# Patient Record
Sex: Male | Born: 1960
Health system: Southern US, Community
[De-identification: ages and names within clinical notes are randomized; demographics above are authoritative.]

## PROBLEM LIST (undated history)

## (undated) DIAGNOSIS — Z720 Tobacco use: Secondary | ICD-10-CM

## (undated) DIAGNOSIS — I5022 Chronic systolic (congestive) heart failure: Secondary | ICD-10-CM

## (undated) DIAGNOSIS — K219 Gastro-esophageal reflux disease without esophagitis: Secondary | ICD-10-CM

## (undated) DIAGNOSIS — J189 Pneumonia, unspecified organism: Secondary | ICD-10-CM

## (undated) DIAGNOSIS — Z9289 Personal history of other medical treatment: Secondary | ICD-10-CM

## (undated) DIAGNOSIS — T7840XA Allergy, unspecified, initial encounter: Secondary | ICD-10-CM

## (undated) DIAGNOSIS — Z8739 Personal history of other diseases of the musculoskeletal system and connective tissue: Secondary | ICD-10-CM

## (undated) DIAGNOSIS — F32A Depression, unspecified: Secondary | ICD-10-CM

## (undated) DIAGNOSIS — M1990 Unspecified osteoarthritis, unspecified site: Secondary | ICD-10-CM

## (undated) DIAGNOSIS — M869 Osteomyelitis, unspecified: Secondary | ICD-10-CM

## (undated) DIAGNOSIS — D649 Anemia, unspecified: Secondary | ICD-10-CM

## (undated) DIAGNOSIS — C689 Malignant neoplasm of urinary organ, unspecified: Secondary | ICD-10-CM

## (undated) DIAGNOSIS — I10 Essential (primary) hypertension: Secondary | ICD-10-CM

## (undated) DIAGNOSIS — R7881 Bacteremia: Secondary | ICD-10-CM

## (undated) DIAGNOSIS — E785 Hyperlipidemia, unspecified: Secondary | ICD-10-CM

## (undated) DIAGNOSIS — I2510 Atherosclerotic heart disease of native coronary artery without angina pectoris: Secondary | ICD-10-CM

## (undated) DIAGNOSIS — J45909 Unspecified asthma, uncomplicated: Secondary | ICD-10-CM

## (undated) HISTORY — PX: WISDOM TOOTH EXTRACTION: SHX21

## (undated) HISTORY — PX: OTHER SURGICAL HISTORY: SHX169

## (undated) HISTORY — DX: Chronic systolic (congestive) heart failure: I50.22

## (undated) HISTORY — PX: KNEE SURGERY: SHX244

## (undated) HISTORY — DX: Unspecified osteoarthritis, unspecified site: M19.90

## (undated) HISTORY — PX: MULTIPLE TOOTH EXTRACTIONS: SHX2053

## (undated) HISTORY — DX: Atherosclerotic heart disease of native coronary artery without angina pectoris: I25.10

## (undated) HISTORY — DX: Essential (primary) hypertension: I10

## (undated) HISTORY — DX: Hyperlipidemia, unspecified: E78.5

## (undated) HISTORY — DX: Allergy, unspecified, initial encounter: T78.40XA

---

## 1999-12-03 ENCOUNTER — Ambulatory Visit (HOSPITAL_COMMUNITY): Admission: RE | Admit: 1999-12-03 | Discharge: 1999-12-03 | Payer: Self-pay | Admitting: Orthopedic Surgery

## 1999-12-03 ENCOUNTER — Encounter: Payer: Self-pay | Admitting: Orthopedic Surgery

## 2000-01-01 ENCOUNTER — Encounter: Payer: Self-pay | Admitting: Orthopedic Surgery

## 2000-01-01 ENCOUNTER — Ambulatory Visit (HOSPITAL_COMMUNITY): Admission: RE | Admit: 2000-01-01 | Discharge: 2000-01-01 | Payer: Self-pay | Admitting: Orthopedic Surgery

## 2000-01-15 ENCOUNTER — Ambulatory Visit (HOSPITAL_COMMUNITY): Admission: RE | Admit: 2000-01-15 | Discharge: 2000-01-15 | Payer: Self-pay | Admitting: Orthopedic Surgery

## 2000-01-15 ENCOUNTER — Encounter: Payer: Self-pay | Admitting: Orthopedic Surgery

## 2000-01-29 ENCOUNTER — Ambulatory Visit (HOSPITAL_COMMUNITY): Admission: RE | Admit: 2000-01-29 | Discharge: 2000-01-29 | Payer: Self-pay | Admitting: Orthopedic Surgery

## 2000-01-29 ENCOUNTER — Encounter: Payer: Self-pay | Admitting: Orthopedic Surgery

## 2000-10-21 ENCOUNTER — Encounter: Admission: RE | Admit: 2000-10-21 | Discharge: 2000-12-22 | Payer: Self-pay | Admitting: Neurological Surgery

## 2001-02-09 ENCOUNTER — Ambulatory Visit (HOSPITAL_COMMUNITY): Admission: RE | Admit: 2001-02-09 | Discharge: 2001-02-09 | Payer: Self-pay | Admitting: Neurological Surgery

## 2001-02-09 ENCOUNTER — Encounter: Payer: Self-pay | Admitting: Neurological Surgery

## 2002-03-22 ENCOUNTER — Emergency Department (HOSPITAL_COMMUNITY): Admission: EM | Admit: 2002-03-22 | Discharge: 2002-03-23 | Payer: Self-pay | Admitting: Emergency Medicine

## 2003-09-27 ENCOUNTER — Encounter: Admission: RE | Admit: 2003-09-27 | Discharge: 2003-09-27 | Payer: Self-pay | Admitting: Internal Medicine

## 2003-09-27 ENCOUNTER — Ambulatory Visit (HOSPITAL_COMMUNITY): Admission: RE | Admit: 2003-09-27 | Discharge: 2003-09-27 | Payer: Self-pay | Admitting: Internal Medicine

## 2003-10-25 ENCOUNTER — Encounter: Admission: RE | Admit: 2003-10-25 | Discharge: 2003-10-25 | Payer: Self-pay | Admitting: Internal Medicine

## 2003-11-06 ENCOUNTER — Ambulatory Visit (HOSPITAL_COMMUNITY): Admission: RE | Admit: 2003-11-06 | Discharge: 2003-11-06 | Payer: Self-pay | Admitting: Internal Medicine

## 2004-07-03 ENCOUNTER — Ambulatory Visit: Payer: Self-pay | Admitting: Internal Medicine

## 2004-07-10 ENCOUNTER — Encounter: Admission: RE | Admit: 2004-07-10 | Discharge: 2004-08-23 | Payer: Self-pay | Admitting: *Deleted

## 2005-07-14 ENCOUNTER — Ambulatory Visit: Payer: Self-pay | Admitting: Family Medicine

## 2005-07-17 ENCOUNTER — Ambulatory Visit: Payer: Self-pay | Admitting: Family Medicine

## 2005-07-24 ENCOUNTER — Ambulatory Visit: Payer: Self-pay | Admitting: *Deleted

## 2005-08-13 ENCOUNTER — Ambulatory Visit: Payer: Self-pay | Admitting: Family Medicine

## 2005-09-22 ENCOUNTER — Ambulatory Visit: Payer: Self-pay | Admitting: Family Medicine

## 2005-09-23 ENCOUNTER — Ambulatory Visit: Payer: Self-pay | Admitting: Family Medicine

## 2005-11-18 ENCOUNTER — Ambulatory Visit: Payer: Self-pay | Admitting: Family Medicine

## 2006-01-05 ENCOUNTER — Ambulatory Visit: Payer: Self-pay | Admitting: Family Medicine

## 2006-03-12 ENCOUNTER — Ambulatory Visit: Payer: Self-pay | Admitting: Internal Medicine

## 2006-04-08 ENCOUNTER — Ambulatory Visit (HOSPITAL_COMMUNITY): Admission: RE | Admit: 2006-04-08 | Discharge: 2006-04-08 | Payer: Self-pay | Admitting: Family Medicine

## 2006-06-18 ENCOUNTER — Ambulatory Visit: Payer: Self-pay | Admitting: Family Medicine

## 2006-07-14 ENCOUNTER — Ambulatory Visit: Payer: Self-pay | Admitting: Family Medicine

## 2006-09-25 ENCOUNTER — Ambulatory Visit: Payer: Self-pay | Admitting: Family Medicine

## 2006-09-29 ENCOUNTER — Ambulatory Visit: Payer: Self-pay | Admitting: Internal Medicine

## 2007-01-13 ENCOUNTER — Ambulatory Visit: Payer: Self-pay | Admitting: Family Medicine

## 2007-04-15 ENCOUNTER — Ambulatory Visit: Payer: Self-pay | Admitting: Family Medicine

## 2007-07-07 ENCOUNTER — Encounter (INDEPENDENT_AMBULATORY_CARE_PROVIDER_SITE_OTHER): Payer: Self-pay | Admitting: *Deleted

## 2007-08-31 ENCOUNTER — Ambulatory Visit: Payer: Self-pay | Admitting: Internal Medicine

## 2007-08-31 ENCOUNTER — Encounter (INDEPENDENT_AMBULATORY_CARE_PROVIDER_SITE_OTHER): Payer: Self-pay | Admitting: Family Medicine

## 2007-08-31 LAB — CONVERTED CEMR LAB
AST: 24 units/L (ref 0–37)
Alkaline Phosphatase: 58 units/L (ref 39–117)
BUN: 17 mg/dL (ref 6–23)
Glucose, Bld: 105 mg/dL — ABNORMAL HIGH (ref 70–99)
HDL: 36 mg/dL — ABNORMAL LOW (ref >39)
LDL Cholesterol: 68 mg/dL (ref 0–99)
Potassium: 4.3 meq/L (ref 3.5–5.3)
Total Bilirubin: 0.6 mg/dL (ref 0.3–1.2)
Total CHOL/HDL Ratio: 3.6
Triglycerides: 132 mg/dL (ref <150)
VLDL: 26 mg/dL (ref 0–40)

## 2007-09-07 ENCOUNTER — Ambulatory Visit: Payer: Self-pay | Admitting: Internal Medicine

## 2007-10-05 ENCOUNTER — Ambulatory Visit: Payer: Self-pay | Admitting: Internal Medicine

## 2007-10-05 ENCOUNTER — Encounter (INDEPENDENT_AMBULATORY_CARE_PROVIDER_SITE_OTHER): Payer: Self-pay | Admitting: Family Medicine

## 2007-10-05 LAB — CONVERTED CEMR LAB: PSA: 0.77 ng/mL (ref 0.10–4.00)

## 2008-06-21 ENCOUNTER — Ambulatory Visit: Payer: Self-pay | Admitting: Internal Medicine

## 2008-06-21 ENCOUNTER — Encounter (INDEPENDENT_AMBULATORY_CARE_PROVIDER_SITE_OTHER): Payer: Self-pay | Admitting: Family Medicine

## 2008-06-21 LAB — CONVERTED CEMR LAB
ALT: 36 units/L (ref 0–53)
AST: 26 units/L (ref 0–37)
Alkaline Phosphatase: 75 units/L (ref 39–117)
BUN: 24 mg/dL — ABNORMAL HIGH (ref 6–23)
Calcium: 9.6 mg/dL (ref 8.4–10.5)
Creatinine, Ser: 0.94 mg/dL (ref 0.40–1.50)
HDL: 58 mg/dL (ref >39)
LDL Cholesterol: 43 mg/dL (ref 0–99)
Microalb, Ur: 3.24 mg/dL — ABNORMAL HIGH (ref 0.00–1.89)
Total Bilirubin: 0.7 mg/dL (ref 0.3–1.2)
Total CHOL/HDL Ratio: 2.4
VLDL: 41 mg/dL — ABNORMAL HIGH (ref 0–40)

## 2008-07-11 ENCOUNTER — Ambulatory Visit: Payer: Self-pay | Admitting: Internal Medicine

## 2008-11-28 ENCOUNTER — Encounter (INDEPENDENT_AMBULATORY_CARE_PROVIDER_SITE_OTHER): Payer: Self-pay | Admitting: Internal Medicine

## 2008-11-28 ENCOUNTER — Ambulatory Visit: Payer: Self-pay | Admitting: Internal Medicine

## 2008-11-28 LAB — CONVERTED CEMR LAB
ALT: 54 units/L — ABNORMAL HIGH (ref 0–53)
Alkaline Phosphatase: 63 units/L (ref 39–117)
CO2: 23 meq/L (ref 19–32)
Cholesterol: 110 mg/dL (ref 0–200)
Creatinine, Ser: 0.8 mg/dL (ref 0.40–1.50)
LDL Cholesterol: 27 mg/dL (ref 0–99)
Total Bilirubin: 0.3 mg/dL (ref 0.3–1.2)
Total CHOL/HDL Ratio: 3
VLDL: 46 mg/dL — ABNORMAL HIGH (ref 0–40)

## 2008-12-01 ENCOUNTER — Encounter (INDEPENDENT_AMBULATORY_CARE_PROVIDER_SITE_OTHER): Payer: Self-pay | Admitting: Internal Medicine

## 2008-12-01 LAB — CONVERTED CEMR LAB: Hep B S Ab: NEGATIVE

## 2008-12-04 ENCOUNTER — Ambulatory Visit: Payer: Self-pay | Admitting: Internal Medicine

## 2009-01-11 ENCOUNTER — Ambulatory Visit: Payer: Self-pay | Admitting: Internal Medicine

## 2009-01-17 ENCOUNTER — Encounter (INDEPENDENT_AMBULATORY_CARE_PROVIDER_SITE_OTHER): Payer: Self-pay | Admitting: *Deleted

## 2009-06-19 ENCOUNTER — Ambulatory Visit: Payer: Self-pay | Admitting: Family Medicine

## 2009-06-19 ENCOUNTER — Encounter (INDEPENDENT_AMBULATORY_CARE_PROVIDER_SITE_OTHER): Payer: Self-pay | Admitting: Adult Health

## 2009-06-19 LAB — CONVERTED CEMR LAB
BUN: 22 mg/dL (ref 6–23)
CO2: 22 meq/L (ref 19–32)
Calcium: 9.9 mg/dL (ref 8.4–10.5)
Chloride: 95 meq/L — ABNORMAL LOW (ref 96–112)
Cholesterol: 150 mg/dL (ref 0–200)
Creatinine, Ser: 1.19 mg/dL (ref 0.40–1.50)
HDL: 30 mg/dL — ABNORMAL LOW (ref >39)
Total CHOL/HDL Ratio: 5

## 2009-06-20 ENCOUNTER — Encounter (INDEPENDENT_AMBULATORY_CARE_PROVIDER_SITE_OTHER): Payer: Self-pay | Admitting: Adult Health

## 2009-06-20 LAB — CONVERTED CEMR LAB
HCV Ab: NEGATIVE
Hep A IgM: NEGATIVE

## 2009-08-09 ENCOUNTER — Ambulatory Visit: Payer: Self-pay | Admitting: Internal Medicine

## 2009-08-09 ENCOUNTER — Encounter (INDEPENDENT_AMBULATORY_CARE_PROVIDER_SITE_OTHER): Payer: Self-pay | Admitting: Adult Health

## 2009-08-09 LAB — CONVERTED CEMR LAB: Microalb, Ur: 6.89 mg/dL — ABNORMAL HIGH (ref 0.00–1.89)

## 2009-08-28 ENCOUNTER — Ambulatory Visit: Payer: Self-pay | Admitting: Internal Medicine

## 2009-08-28 ENCOUNTER — Encounter (INDEPENDENT_AMBULATORY_CARE_PROVIDER_SITE_OTHER): Payer: Self-pay | Admitting: Adult Health

## 2009-08-28 LAB — CONVERTED CEMR LAB
AST: 79 units/L — ABNORMAL HIGH (ref 0–37)
Albumin: 5 g/dL (ref 3.5–5.2)
Alkaline Phosphatase: 65 units/L (ref 39–117)
HDL: 40 mg/dL (ref >39)
LDL Cholesterol: 66 mg/dL (ref 0–99)
Potassium: 5 meq/L (ref 3.5–5.3)
Sodium: 139 meq/L (ref 135–145)
Total Protein: 7.7 g/dL (ref 6.0–8.3)

## 2009-12-26 ENCOUNTER — Encounter (INDEPENDENT_AMBULATORY_CARE_PROVIDER_SITE_OTHER): Payer: Self-pay | Admitting: Adult Health

## 2009-12-26 ENCOUNTER — Ambulatory Visit: Payer: Self-pay | Admitting: Internal Medicine

## 2009-12-26 LAB — CONVERTED CEMR LAB
ALT: 61 units/L — ABNORMAL HIGH (ref 0–53)
CO2: 25 meq/L (ref 19–32)
Chloride: 102 meq/L (ref 96–112)
Sodium: 140 meq/L (ref 135–145)
Total Bilirubin: 0.6 mg/dL (ref 0.3–1.2)
Total Protein: 7.6 g/dL (ref 6.0–8.3)

## 2010-03-06 ENCOUNTER — Ambulatory Visit: Payer: Self-pay | Admitting: Internal Medicine

## 2010-09-09 ENCOUNTER — Encounter (INDEPENDENT_AMBULATORY_CARE_PROVIDER_SITE_OTHER): Payer: Self-pay | Admitting: *Deleted

## 2010-09-09 LAB — CONVERTED CEMR LAB
Alkaline Phosphatase: 84 units/L (ref 39–117)
Barbiturate Quant, Ur: NEGATIVE
Creatinine, Ser: 1.11 mg/dL (ref 0.40–1.50)
Creatinine,U: 324.7 mg/dL
Glucose, Bld: 142 mg/dL — ABNORMAL HIGH (ref 70–99)
HDL: 36 mg/dL — ABNORMAL LOW (ref >39)
LDL Cholesterol: 79 mg/dL (ref 0–99)
Methadone: NEGATIVE
Opiate Screen, Urine: NEGATIVE
Phencyclidine (PCP): NEGATIVE
Propoxyphene: NEGATIVE
Sodium: 140 meq/L (ref 135–145)
Total Bilirubin: 0.6 mg/dL (ref 0.3–1.2)
Total CHOL/HDL Ratio: 4.4
Total Protein: 8 g/dL (ref 6.0–8.3)
Triglycerides: 221 mg/dL — ABNORMAL HIGH (ref <150)
VLDL: 44 mg/dL — ABNORMAL HIGH (ref 0–40)

## 2010-09-17 ENCOUNTER — Ambulatory Visit (HOSPITAL_COMMUNITY): Admission: RE | Admit: 2010-09-17 | Discharge: 2010-09-17 | Payer: Self-pay | Admitting: Family Medicine

## 2010-11-08 ENCOUNTER — Encounter (INDEPENDENT_AMBULATORY_CARE_PROVIDER_SITE_OTHER): Payer: Self-pay | Admitting: *Deleted

## 2010-11-08 LAB — CONVERTED CEMR LAB
ALT: 111 units/L — ABNORMAL HIGH (ref 0–53)
Albumin: 5 g/dL (ref 3.5–5.2)
Total Protein: 7.9 g/dL (ref 6.0–8.3)

## 2011-01-29 ENCOUNTER — Inpatient Hospital Stay (HOSPITAL_COMMUNITY)
Admission: EM | Admit: 2011-01-29 | Discharge: 2011-02-03 | DRG: 617 | Disposition: A | Discharge status: Home Health | Payer: Self-pay | Attending: Internal Medicine | Admitting: Internal Medicine

## 2011-01-29 ENCOUNTER — Ambulatory Visit (INDEPENDENT_AMBULATORY_CARE_PROVIDER_SITE_OTHER): Payer: Self-pay

## 2011-01-29 ENCOUNTER — Inpatient Hospital Stay (INDEPENDENT_AMBULATORY_CARE_PROVIDER_SITE_OTHER)
Admission: RE | Admit: 2011-01-29 | Discharge: 2011-01-29 | Disposition: A | Discharge status: Home / Self Care | Payer: Self-pay | Source: Ambulatory Visit | Attending: Emergency Medicine | Admitting: Emergency Medicine

## 2011-01-29 ENCOUNTER — Emergency Department (HOSPITAL_COMMUNITY): Payer: Self-pay

## 2011-01-29 DIAGNOSIS — E1169 Type 2 diabetes mellitus with other specified complication: Principal | ICD-10-CM | POA: Diagnosis present

## 2011-01-29 DIAGNOSIS — M9080 Osteopathy in diseases classified elsewhere, unspecified site: Secondary | ICD-10-CM | POA: Diagnosis present

## 2011-01-29 DIAGNOSIS — L97509 Non-pressure chronic ulcer of other part of unspecified foot with unspecified severity: Secondary | ICD-10-CM

## 2011-01-29 DIAGNOSIS — Z7982 Long term (current) use of aspirin: Secondary | ICD-10-CM

## 2011-01-29 DIAGNOSIS — I428 Other cardiomyopathies: Secondary | ICD-10-CM | POA: Diagnosis present

## 2011-01-29 DIAGNOSIS — J301 Allergic rhinitis due to pollen: Secondary | ICD-10-CM | POA: Diagnosis present

## 2011-01-29 DIAGNOSIS — G894 Chronic pain syndrome: Secondary | ICD-10-CM | POA: Diagnosis present

## 2011-01-29 DIAGNOSIS — I10 Essential (primary) hypertension: Secondary | ICD-10-CM | POA: Diagnosis present

## 2011-01-29 DIAGNOSIS — E669 Obesity, unspecified: Secondary | ICD-10-CM | POA: Diagnosis present

## 2011-01-29 DIAGNOSIS — M869 Osteomyelitis, unspecified: Secondary | ICD-10-CM

## 2011-01-29 DIAGNOSIS — L02619 Cutaneous abscess of unspecified foot: Secondary | ICD-10-CM | POA: Diagnosis present

## 2011-01-29 DIAGNOSIS — Z79899 Other long term (current) drug therapy: Secondary | ICD-10-CM

## 2011-01-29 DIAGNOSIS — K219 Gastro-esophageal reflux disease without esophagitis: Secondary | ICD-10-CM | POA: Diagnosis present

## 2011-01-29 DIAGNOSIS — M86179 Other acute osteomyelitis, unspecified ankle and foot: Secondary | ICD-10-CM | POA: Diagnosis present

## 2011-01-29 LAB — BASIC METABOLIC PANEL
BUN: 20 mg/dL (ref 6–23)
BUN: 17 mg/dL (ref 6–23)
Calcium: 8.7 mg/dL (ref 8.4–10.5)
Calcium: 8.5 mg/dL (ref 8.4–10.5)
Chloride: 102 mEq/L (ref 96–112)
Creatinine, Ser: 1.13 mg/dL (ref 0.4–1.5)
GFR calc Af Amer: >60 mL/min (ref >60)
GFR calc non Af Amer: >60 mL/min (ref >60)
GFR calc non Af Amer: >60 mL/min (ref >60)
Potassium: 4.2 mEq/L (ref 3.5–5.1)
Sodium: 136 mEq/L (ref 135–145)

## 2011-01-29 LAB — CBC
HCT: 35.6 % — ABNORMAL LOW (ref 39.0–52.0)
Hemoglobin: 12.3 g/dL — ABNORMAL LOW (ref 13.0–17.0)
MCH: 31.1 pg (ref 26.0–34.0)
MCH: 30.8 pg (ref 26.0–34.0)
MCHC: 34.6 g/dL (ref 30.0–36.0)
MCV: 89.9 fL (ref 78.0–100.0)
MCV: 89.6 fL (ref 78.0–100.0)
Platelets: 201 10*3/uL (ref 150–400)
Platelets: 198 10*3/uL (ref 150–400)
RBC: 3.96 MIL/uL — ABNORMAL LOW (ref 4.22–5.81)
RBC: 3.67 MIL/uL — ABNORMAL LOW (ref 4.22–5.81)
RDW: 13.1 % (ref 11.5–15.5)
RDW: 13.3 % (ref 11.5–15.5)
WBC: 7.8 10*3/uL (ref 4.0–10.5)

## 2011-01-29 LAB — BASIC METABOLIC PANEL WITH GFR
CO2: 23 meq/L (ref 19–32)
Chloride: 105 meq/L (ref 96–112)
Creatinine, Ser: 1.07 mg/dL (ref 0.4–1.5)
GFR calc Af Amer: >60 mL/min (ref >60)
Glucose, Bld: 102 mg/dL — ABNORMAL HIGH (ref 70–99)

## 2011-01-29 LAB — DIFFERENTIAL
Basophils Absolute: 0 10*3/uL (ref 0.0–0.1)
Basophils Relative: 0 % (ref 0–1)
Eosinophils Absolute: 0.1 10*3/uL (ref 0.0–0.7)
Eosinophils Relative: 2 % (ref 0–5)
Lymphocytes Relative: 19 % (ref 12–46)
Lymphs Abs: 1.4 K/uL (ref 0.7–4.0)
Monocytes Absolute: 0.6 10*3/uL (ref 0.1–1.0)
Monocytes Relative: 8 % (ref 3–12)
Neutro Abs: 5.6 K/uL (ref 1.7–7.7)
Neutrophils Relative %: 72 % (ref 43–77)

## 2011-01-29 LAB — HEMOGLOBIN A1C: Mean Plasma Glucose: 114 mg/dL (ref <117)

## 2011-01-29 LAB — GLUCOSE, CAPILLARY

## 2011-01-29 LAB — PHOSPHORUS: Phosphorus: 3.2 mg/dL (ref 2.3–4.6)

## 2011-01-29 LAB — MAGNESIUM
Magnesium: 1.8 mg/dL (ref 1.5–2.5)
Magnesium: 1.7 mg/dL (ref 1.5–2.5)

## 2011-01-29 MED ORDER — GADOBENATE DIMEGLUMINE 529 MG/ML IV SOLN
20.0000 mL | Freq: Once | INTRAVENOUS | Status: AC
  Administered 2011-01-29: 20 mL via INTRAVENOUS

## 2011-01-30 LAB — GLUCOSE, CAPILLARY: Glucose-Capillary: 114 mg/dL — ABNORMAL HIGH (ref 70–99)

## 2011-01-31 ENCOUNTER — Inpatient Hospital Stay (HOSPITAL_COMMUNITY): Payer: Self-pay

## 2011-01-31 LAB — GLUCOSE, CAPILLARY
Glucose-Capillary: 118 mg/dL — ABNORMAL HIGH (ref 70–99)
Glucose-Capillary: 143 mg/dL — ABNORMAL HIGH (ref 70–99)

## 2011-01-31 LAB — SURGICAL PCR SCREEN
MRSA, PCR: NEGATIVE
Staphylococcus aureus: POSITIVE — AB

## 2011-01-31 LAB — APTT: aPTT: 42 seconds — ABNORMAL HIGH (ref 24–37)

## 2011-01-31 LAB — PROTIME-INR: Prothrombin Time: 13.5 seconds (ref 11.6–15.2)

## 2011-02-01 LAB — BASIC METABOLIC PANEL
CO2: 27 mEq/L (ref 19–32)
Chloride: 101 mEq/L (ref 96–112)
GFR calc Af Amer: >60 mL/min (ref >60)
Sodium: 137 mEq/L (ref 135–145)

## 2011-02-01 LAB — BASIC METABOLIC PANEL WITH GFR
BUN: 12 mg/dL (ref 6–23)
Calcium: 9 mg/dL (ref 8.4–10.5)
Creatinine, Ser: 1.09 mg/dL (ref 0.4–1.5)
GFR calc non Af Amer: >60 mL/min (ref >60)
Glucose, Bld: 108 mg/dL — ABNORMAL HIGH (ref 70–99)
Potassium: 3.9 meq/L (ref 3.5–5.1)

## 2011-02-01 LAB — CBC
HCT: 30.9 % — ABNORMAL LOW (ref 39.0–52.0)
Hemoglobin: 10.5 g/dL — ABNORMAL LOW (ref 13.0–17.0)
MCH: 30.4 pg (ref 26.0–34.0)
MCHC: 34 g/dL (ref 30.0–36.0)
MCV: 89.6 fL (ref 78.0–100.0)
Platelets: 192 K/uL (ref 150–400)
RBC: 3.45 MIL/uL — ABNORMAL LOW (ref 4.22–5.81)
RDW: 13.1 % (ref 11.5–15.5)
WBC: 5.3 K/uL (ref 4.0–10.5)

## 2011-02-01 LAB — GLUCOSE, CAPILLARY
Glucose-Capillary: 131 mg/dL — ABNORMAL HIGH (ref 70–99)
Glucose-Capillary: 105 mg/dL — ABNORMAL HIGH (ref 70–99)
Glucose-Capillary: 105 mg/dL — ABNORMAL HIGH (ref 70–99)

## 2011-02-02 LAB — GLUCOSE, CAPILLARY
Glucose-Capillary: 121 mg/dL — ABNORMAL HIGH (ref 70–99)
Glucose-Capillary: 129 mg/dL — ABNORMAL HIGH (ref 70–99)

## 2011-02-03 ENCOUNTER — Other Ambulatory Visit: Payer: Self-pay | Admitting: Orthopedic Surgery

## 2011-02-03 LAB — BASIC METABOLIC PANEL
BUN: 17 mg/dL (ref 6–23)
Chloride: 103 mEq/L (ref 96–112)
Creatinine, Ser: 1.08 mg/dL (ref 0.4–1.5)

## 2011-02-03 LAB — CBC
MCH: 30.6 pg (ref 26.0–34.0)
MCHC: 34.4 g/dL (ref 30.0–36.0)
MCV: 88.9 fL (ref 78.0–100.0)
Platelets: 180 10*3/uL (ref 150–400)
RBC: 3.24 MIL/uL — ABNORMAL LOW (ref 4.22–5.81)
RDW: 13 % (ref 11.5–15.5)

## 2011-02-03 LAB — GLUCOSE, CAPILLARY
Glucose-Capillary: 100 mg/dL — ABNORMAL HIGH (ref 70–99)
Glucose-Capillary: 100 mg/dL — ABNORMAL HIGH (ref 70–99)

## 2011-02-04 NOTE — Discharge Summary (Signed)
NAMEGRAEME, MENEES              ACCOUNT NO.:  [id number]  MEDICAL RECORD NO.:  [id number]           PATIENT TYPE:  I  LOCATION:  5524                         FACILITY:  [facility]  PHYSICIAN:  Jeoffrey Massed, MD    DATE OF BIRTH:  [birth year]  DATE OF ADMISSION:  01/29/2011 DATE OF DISCHARGE:                        DISCHARGE SUMMARY - REFERRING   PRIMARY CARE PRACTITIONER:  At [facility].  PRIMARY DISCHARGE DIAGNOSIS:  Acute osteomyelitis of the left second toe, status post amputation.  SECONDARY DISCHARGE DIAGNOSES: 1. Diabetes mellitus type 2. 2. Hypertension. 3. Chronic pain syndrome. 4. Seasonal allergies. 5. Chronic back pain.  DISCHARGE MEDICATIONS: 1. Oxycodone 5 mg 1 tablet p.o. q.6 h. p.r.n. 2. Actos 30 mg 1 tablet daily. 3. Amaryl 4 mg 2 tablets p.o. daily. 4. Aspirin 81 mg 1 tablet daily. 5. Atenolol 100 mg 1 tablet daily. 6. Losartan 100 mg 1 tablet p.o. daily. 7. Metformin 1000 mg 1 tablet p.o. twice daily.8. Nasacort 1 spray both nostrils daily as needed. 9. Protonix 40 mg 1 tablet daily. 10.Sulindac 200 mg 1 tablet p.o. twice daily. 11.Therapeutic vitamin with minerals 1 tablet p.o. daily. 12.Tramadol 50 mg 2 tablets p.o. four times a day. 13.Triamcinolone one application topically daily. 14.Xyzal 5 mg 1 tablet p.o. daily.  CONSULTATIONS:  Nadara Mustard, MD  BRIEF HISTORY OF PRESENT ILLNESS:  The patient is a 50 year old gentleman with a history of diabetes, hypertension who presented to the hospital on January 29, 2011 for left second toe pain, swelling and redness.  For further details, please see the history and physical that was dictated by Dr. Keane Police in admission.  PERTINENT LABORATORY DATA: 1. CBC on discharge shows a WBC of 5.5, hemoglobin of 9.9 and platelet     count of 180. 2. Chemistries on discharge shows sodium of 137, potassium of 3.9,     chloride of 103, bicarb of 27, glucose of 129, BUN of 17,     creatinine of 1.08 and a calcium of  8.8. 3. HbA1c is 5.6.  PERTINENT RADIOLOGICAL STUDIES:  MRI of left foot shows intense cellulitis of the second toe with finding consistent with osteomyelitis throughout the distal pharynx second toe.  No abscess is identified.  BRIEF HOSPITAL COURSE: 1. Acute osteomyelitis with cellulitis of the second left toe.  The     patient was admitted and empirically started on vancomycin and     ciprofloxacin.  Orthopedics was consulted.  Dr. Lajoyce Corners did see this     patient and felt that the patient would best benefit from an     amputation of the left second toe.  The patient subsequently     underwent a left second toe ray amputation on the January 31, 2011.     Postoperatively, he did well.  Today, Dr. Lajoyce Corners did make rounds and     he suggested that the patient was okay to be discharged home.  I     have personally spoke to him later on the phone and discussed with     him antibiotics and as per his suggestion, he feels that the     patient does  not need any antibiotics as all of the infected area     has been removed.  There was no abscess and remaining tissue showed     no signs of any infection as well.  So at this point, all of his     antibiotics have been discontinued and the patient is being     discharged home without any antibiotics as the suggestion of Dr.     Lajoyce Corners, his orthopedic surgeon.  I have instructed the patient to do     wound care as per the orthopedic instruction, basically this is a     dried dressing changes daily.  The patient will follow up with Dr.     Lajoyce Corners in 1-2 weeks.  Dr. Audrie Lia number has been placed on the chart     for the patient to call and to make an appointment. 2. Diabetes, this is stable.  The patient is to continue all of his     usual medications as noted above. 3. Hypertension, this is controlled.  He is to continue all of his     other medications as noted above.  FOLLOWUP INSTRUCTIONS: 1. The patient is to call Dr. Lajoyce Corners at the number left on the  pink     sheet to make an appointment in the next 1 or 2 weeks. 2. The patient is to do dry dressing daily. 3. If the patient were to develop a fever, pain or swelling at the     operative site, he is to present urgently back to Dr. Audrie Lia office     or present to the emergency room for further evaluation.  He claims     understanding.  Total time spent coordinating discharge 45 minutes.     Jeoffrey Massed, MD     SG/MEDQ  D:  02/03/2011  T:  02/03/2011  Job:  [id number]  cc:   Norberto Sorenson, MD  Electronically Signed by Jeoffrey Massed  on 02/04/2011 08:22:18 PM

## 2011-02-10 ENCOUNTER — Inpatient Hospital Stay (INDEPENDENT_AMBULATORY_CARE_PROVIDER_SITE_OTHER)
Admission: RE | Admit: 2011-02-10 | Discharge: 2011-02-10 | Disposition: A | Discharge status: Home / Self Care | Payer: Self-pay | Source: Ambulatory Visit | Attending: Family Medicine | Admitting: Family Medicine

## 2011-02-10 ENCOUNTER — Inpatient Hospital Stay (HOSPITAL_COMMUNITY)
Admission: EM | Admit: 2011-02-10 | Discharge: 2011-02-15 | DRG: 863 | Disposition: A | Discharge status: Home Health | Payer: Self-pay | Attending: Internal Medicine | Admitting: Internal Medicine

## 2011-02-10 ENCOUNTER — Emergency Department (HOSPITAL_COMMUNITY): Payer: Self-pay

## 2011-02-10 DIAGNOSIS — Y835 Amputation of limb(s) as the cause of abnormal reaction of the patient, or of later complication, without mention of misadventure at the time of the procedure: Secondary | ICD-10-CM | POA: Diagnosis present

## 2011-02-10 DIAGNOSIS — I10 Essential (primary) hypertension: Secondary | ICD-10-CM | POA: Diagnosis present

## 2011-02-10 DIAGNOSIS — Y92009 Unspecified place in unspecified non-institutional (private) residence as the place of occurrence of the external cause: Secondary | ICD-10-CM

## 2011-02-10 DIAGNOSIS — Z87891 Personal history of nicotine dependence: Secondary | ICD-10-CM

## 2011-02-10 DIAGNOSIS — A4901 Methicillin susceptible Staphylococcus aureus infection, unspecified site: Secondary | ICD-10-CM | POA: Diagnosis present

## 2011-02-10 DIAGNOSIS — E1169 Type 2 diabetes mellitus with other specified complication: Secondary | ICD-10-CM | POA: Diagnosis not present

## 2011-02-10 DIAGNOSIS — K219 Gastro-esophageal reflux disease without esophagitis: Secondary | ICD-10-CM | POA: Diagnosis present

## 2011-02-10 DIAGNOSIS — L02619 Cutaneous abscess of unspecified foot: Secondary | ICD-10-CM | POA: Diagnosis present

## 2011-02-10 DIAGNOSIS — G894 Chronic pain syndrome: Secondary | ICD-10-CM | POA: Diagnosis present

## 2011-02-10 DIAGNOSIS — L089 Local infection of the skin and subcutaneous tissue, unspecified: Secondary | ICD-10-CM

## 2011-02-10 DIAGNOSIS — Z833 Family history of diabetes mellitus: Secondary | ICD-10-CM

## 2011-02-10 DIAGNOSIS — Z7982 Long term (current) use of aspirin: Secondary | ICD-10-CM

## 2011-02-10 DIAGNOSIS — Z79899 Other long term (current) drug therapy: Secondary | ICD-10-CM

## 2011-02-10 DIAGNOSIS — T8140XA Infection following a procedure, unspecified, initial encounter: Principal | ICD-10-CM | POA: Diagnosis present

## 2011-02-10 LAB — CBC
HCT: 35.3 % — ABNORMAL LOW (ref 39.0–52.0)
Hemoglobin: 12.2 g/dL — ABNORMAL LOW (ref 13.0–17.0)
MCV: 88.7 fL (ref 78.0–100.0)
RBC: 3.98 MIL/uL — ABNORMAL LOW (ref 4.22–5.81)
WBC: 10.5 10*3/uL (ref 4.0–10.5)

## 2011-02-10 LAB — BASIC METABOLIC PANEL
BUN: 21 mg/dL (ref 6–23)
CO2: 27 mEq/L (ref 19–32)
Chloride: 102 mEq/L (ref 96–112)
Glucose, Bld: 82 mg/dL (ref 70–99)
Potassium: 4.6 mEq/L (ref 3.5–5.1)

## 2011-02-10 LAB — DIFFERENTIAL
Eosinophils Relative: 1 % (ref 0–5)
Lymphocytes Relative: 18 % (ref 12–46)
Lymphs Abs: 1.8 10*3/uL (ref 0.7–4.0)
Neutrophils Relative %: 71 % (ref 43–77)

## 2011-02-11 LAB — GLUCOSE, CAPILLARY
Glucose-Capillary: 118 mg/dL — ABNORMAL HIGH (ref 70–99)
Glucose-Capillary: 133 mg/dL — ABNORMAL HIGH (ref 70–99)
Glucose-Capillary: 141 mg/dL — ABNORMAL HIGH (ref 70–99)
Glucose-Capillary: 121 mg/dL — ABNORMAL HIGH (ref 70–99)

## 2011-02-11 NOTE — Op Note (Signed)
  [name], Robert Burnett              ACCOUNT NO.:  [id number]  MEDICAL RECORD NO.:  [id number]           PATIENT TYPE:  I  LOCATION:  5524                         FACILITY:  [facility]  PHYSICIAN:  Nadara Mustard, MD     DATE OF BIRTH:  06-Dec-1960  DATE OF PROCEDURE:  01/31/2011 DATE OF DISCHARGE:                              OPERATIVE REPORT   PREOPERATIVE DIAGNOSIS:  Osteomyelitis, ulcer, cellulitis, left foot second toe.  POSTOPERATIVE DIAGNOSIS:  Osteomyelitis, ulcer, cellulitis, left foot second toe.  PROCEDURE:  Left foot second ray amputation.  SURGEON:  Nadara Mustard, MD  ANESTHESIA:  Ankle block.  ESTIMATED BLOOD LOSS:  Minimal.  ANTIBIOTICS:  Vancomycin and Cipro preoperatively.  DRAINS:  None.  COMPLICATIONS:  None.  TOURNIQUET TIME:  None.  DISPOSITION:  To PACU in stable condition.  INDICATIONS FOR PROCEDURE:  The patient is a 50 year old gentleman with cellulitis, sausage digit swelling, ulcer, osteomyelitis of the left foot second toe.  The patient has been admitted, started on IV antibiotics, the cellulitis has resolved up to the MTP joint and the patient presents at this time for surgical intervention.  Risks and benefits were discussed including infection, neurovascular injury, nonhealing of the wound, need for higher-level amputation.  The patient states he understands and wished to proceed at this time.  DESCRIPTION OF PROCEDURE:  The patient was brought to OR room 4 after undergoing an ankle block.  After adequate level of anesthesia obtained, the patient's left lower extremity was prepped using DuraPrep and draped into sterile field.  The second toe was then locally infiltrated with 10 mL of 1% lidocaine plain.  A racket incision was made dorsally on the foot around the second ray.  There was no plantar incision.  The second metatarsal was resected towards the base of the second metatarsal.  The metatarsal and second toe were resected in one block  of tissue.  The electrocautery was used for hemostasis.  The wound was irrigated with normal saline.  There was good petechial bleeding.  There was no abscess within the surgical field. The incision was closed using 2-0 nylon with a far-near and near-far suture technique.  The wound was covered with Adaptic orthopedic sponges, ABD dressing, Kerlix and Coban.  The patient was then taken to the PACU in stable condition.     Nadara Mustard, MD     MVD/MEDQ  D:  01/31/2011  T:  02/01/2011  Job:  [id number]  Electronically Signed by Aldean Baker MD on 02/11/2011 03:01:10 PM

## 2011-02-12 LAB — GLUCOSE, CAPILLARY
Glucose-Capillary: 57 mg/dL — ABNORMAL LOW (ref 70–99)
Glucose-Capillary: 115 mg/dL — ABNORMAL HIGH (ref 70–99)

## 2011-02-12 LAB — CBC
HCT: 33.4 % — ABNORMAL LOW (ref 39.0–52.0)
Hemoglobin: 11.3 g/dL — ABNORMAL LOW (ref 13.0–17.0)
MCV: 89.3 fL (ref 78.0–100.0)
RDW: 12.8 % (ref 11.5–15.5)
WBC: 4.7 10*3/uL (ref 4.0–10.5)

## 2011-02-12 LAB — DIFFERENTIAL
Eosinophils Relative: 7 % — ABNORMAL HIGH (ref 0–5)
Lymphocytes Relative: 37 % (ref 12–46)
Lymphs Abs: 1.8 10*3/uL (ref 0.7–4.0)
Neutro Abs: 2.1 10*3/uL (ref 1.7–7.7)

## 2011-02-12 NOTE — H&P (Signed)
Robert Burnett, Robert Burnett              ACCOUNT NO.:  [id number]  MEDICAL RECORD NO.:  [id number]           PATIENT TYPE:  E  LOCATION:  [facility]                         FACILITY:  [facility]  PHYSICIAN:  Mariea Stable, MD   DATE OF BIRTH:  [birth year]  DATE OF ADMISSION:  02/11/2011 DATE OF DISCHARGE:                             HISTORY & PHYSICAL   CHIEF COMPLAINT:  Increasing redness, pain, swelling of left foot.  PRIMARY CARE PHYSICIAN:  [facility].  HISTORY OF PRESENT ILLNESS:  Robert Burnett is a 50 year old gentleman with past medical history significant for diabetes as well as osteomyelitis status post left foot second ray amputation on January 31, 2011.  The patient states during the hospitalization, he was treated with IV antibiotics, however, upon discharge, he was discharged without any antibiotics as his wound had been healing appropriately.  Two days ago, the patient states that he has had some increasing redness, pain, and swelling of the left foot extending approximately on the dorsum.  More importantly there has been some increased drainage at the site of the incision on the dorsum of the foot associated with all of this.  Given the above symptoms, he figured it was safest to come to the emergency department for evaluation.  PAST MEDICAL HISTORY: 1. Osteomyelitis of second toe on the left foot status post     amputation. 2. Diabetes mellitus. 3. Hypertension. 4. Chronic pain syndrome. 5. Seasonal allergies. 6. Degenerative joint disease. 7. Scoliosis.  MEDICATIONS: 1. Oxycodone 5 mg 1 tablet p.o. q.6 hours p.r.n. pain. 2. Actos 30 mg p.o. daily. 3. Amaryl 4 mg 2 tablets p.o. daily. 4. Aspirin 81 mg p.o. daily. 5. Atenolol 100 mg p.o. daily. 6. Losartan 100 mg p.o. daily. 7. Metformin 1000 mg p.o. b.i.d. 8. Nasacort 1 spray in each nostril daily p.r.n. allergies. 9. Protonix 40 mg p.o. daily. 10.Sulindac 200 mg p.o. b.i.d. 11.Multivitamin 1 tablet p.o.  daily. 12.Tramadol 50 mg 2 tablets p.o. 4 times a day. 13.Triamcinolone applied topically daily. 14.Xyzal 5 mg p.o. daily.  ALLERGIES:  PROZAC and NORVASC.  SOCIAL HISTORY:  The patient has a prior history of alcohol and tobacco use, but quit approximately 10 years ago.  He denies any illicit drug use.  The patient lives with father whom he takes care of.  The patient's father's name is Robert Burnett and he is 51 years old. The patient states that in case of need for medical decision making, his father should be contacted and the home phone number is [phone]. However, patient's father will be staying with a friend while he is in the hospital and her name is Robert Burnett, and her home phone number is [phone].  FAMILY HISTORY:  Positive for diabetes and pancreatic cancer.  REVIEW OF SYSTEMS:  As per HPI, all others reviewed and negative.  PHYSICAL EXAMINATION:  VITAL SIGNS:  Temperature 98.4, blood pressure 113/74, heart rate of 63, respirations 18, oxygen saturation 95% on room air.  GENERAL:  This is a middle-aged man sitting in bed in no acute distress. HEENT:  His head is normocephalic, atraumatic.  Pupils equally round and reactive to light  and accommodation.  Extraocular movements are intact. Sclerae anicteric.  Mucous membranes are moist.  Oropharynx is clear. NECK:  Supple.  There is no JVD or carotid bruits. LUNGS:  Clear to auscultation bilaterally with good air movement. HEART:  Normal S1 and S2 with a regular rate and rhythm.  No murmur, gallops, or rubs. ABDOMEN:  Positive bowel sounds, soft, nontender. NEUROLOGIC:  The patient is awake, alert, and oriented x3.  Cranial nerves II-XII are intact.  Motor is intact.  Sensation is intact. EXTREMITIES:  There is no cyanosis, clubbing, or edema.  Left foot is status post amputation of the second toe.  The sutures are still in place from the surgery.  There is edema, erythema, tenderness to palpation of the  of most of the dorsum of the foot.  There is a small amount of purulent-appearing drainage noted at the site of incision. There is no flow fluctuance or crepitance noted with palpation.  LABORATORY DATA:  WBC 10.5, hemoglobin 12.2, platelets 243.  Sodium 138, potassium 4.6, chloride 102, bicarb 27, glucose 82, BUN 21, creatinine 1.21, calcium 9.6, ESR 41.  IMAGING:  X-ray of the left foot, impression status post amputation at the level of the proximal second metatarsal.  No plain film findings to suggest osteo.  ASSESSMENT AND PLAN: 1. Cellulitis of left foot status post amputation of the second toe     secondary to osteomyelitis.  Given patient's recent admission,     diabetes, although controlled and physical findings, we will go     ahead and treat with broad-spectrum antibiotics including     vancomycin and Zosyn.  This will be done initially to cover     healthcare-associated organisms including MRSA and Pseudomonas.     The patient does have well-controlled diabetes making anaerobic     infection less likely, but will be covered with the Zosyn.  If     patient has a good response to intravenous antibiotics, consider     change to broad-spectrum or oral coverage.  Would consider an     Orthopedic consultation in the morning to assess given his recent     surgery.  X-ray does not show any evidence of osteomyelitis,     although this may take some time to develop.  Can consider an MRI     of the foot, although assume this will show inflammatory/edematous     changes with that may be secondary to infection or recent surgery,     and therefore we will defer to primary team/ortho to order if     needed. 2. Diabetes.  The patient had a hemoglobin A1c done a few days ago in     the previous hospitalization.  We will     hold anti-hyperglycemics at this point and cover with sliding scale     insulin. 3. Hypertension.  Will continue the patient's home medications. 4. Chronic pain.  We  will manage pain per #1.     Mariea Stable, MD     MA/MEDQ  D:  02/11/2011  T:  02/11/2011  Job:  [id number]  cc:   Robert Banker Nadara Mustard, MD  Electronically Signed by Mariea Stable MD on 02/12/2011 02:45:49 PM

## 2011-02-13 LAB — CBC
HCT: 29.6 % — ABNORMAL LOW (ref 39.0–52.0)
Hemoglobin: 10 g/dL — ABNORMAL LOW (ref 13.0–17.0)
MCH: 30.2 pg (ref 26.0–34.0)
MCHC: 33.8 g/dL (ref 30.0–36.0)
MCV: 89.4 fL (ref 78.0–100.0)
RBC: 3.31 MIL/uL — ABNORMAL LOW (ref 4.22–5.81)

## 2011-02-13 LAB — WOUND CULTURE

## 2011-02-13 LAB — BASIC METABOLIC PANEL
BUN: 18 mg/dL (ref 6–23)
CO2: 28 mEq/L (ref 19–32)
Calcium: 9.3 mg/dL (ref 8.4–10.5)
Chloride: 105 mEq/L (ref 96–112)
Creatinine, Ser: 1.4 mg/dL (ref 0.4–1.5)
Glucose, Bld: 97 mg/dL (ref 70–99)

## 2011-02-13 LAB — GLUCOSE, CAPILLARY: Glucose-Capillary: 111 mg/dL — ABNORMAL HIGH (ref 70–99)

## 2011-02-14 LAB — GLUCOSE, CAPILLARY
Glucose-Capillary: 53 mg/dL — ABNORMAL LOW (ref 70–99)
Glucose-Capillary: 95 mg/dL (ref 70–99)
Glucose-Capillary: 63 mg/dL — ABNORMAL LOW (ref 70–99)
Glucose-Capillary: 99 mg/dL (ref 70–99)
Glucose-Capillary: 77 mg/dL (ref 70–99)

## 2011-02-15 LAB — GLUCOSE, CAPILLARY: Glucose-Capillary: 100 mg/dL — ABNORMAL HIGH (ref 70–99)

## 2011-03-12 NOTE — Discharge Summary (Signed)
Robert Burnett, Robert Burnett              ACCOUNT NO.:  [id number]  MEDICAL RECORD NO.:  [id number]           PATIENT TYPE:  I  LOCATION:  5021                         FACILITY:  [facility]  PHYSICIAN:  Joretta Eads I Henrine Hayter, MD      DATE OF BIRTH:  Aug 23, 1961  DATE OF ADMISSION:  02/10/2011 DATE OF DISCHARGE:  02/15/2011                              DISCHARGE SUMMARY   PRIMARY CARE PHYSICIAN:  [facility].  ORTHOPEDICS:  Nadara Mustard, MD  DISCHARGE DIAGNOSES: 1. Cellulitis of the left foot after osteomyelitis of the left second     toe, status post amputation on February 01, 2011. 2. Type 2 diabetes mellitus with some frequent hypoglycemic event. 3. Hypertension. 4. Chronic pain syndrome. 5. Chronic back pain.  DISCHARGE MEDICATIONS: 1. Oxycodone 5 mg p.o. q.6 hour p.r.n. 2. Actos 30 mg p.o. daily. 3. Amaryl 0.5 mg p.o. b.i.d., further dose to be adjusted by patient's     primary care physician. 4. Aspirin 81 mg p.o. daily. 5. Atenolol 100 mg p.o. daily. 6. Losartan 100 mg p.o. daily. 7. Metformin 1000 mg p.o. b.i.d. 8. Protonix 40 mg daily. 9. Sulindac 200 mg p.o. daily. 10.Vitamin D with mineral 1 tablet daily. 11.Tramadol 50 mg 2 tablets p.o. 4 times daily. 12.Triamcinolone applicable daily. 13.Doxycycline 100 mg p.o. b.i.d. for 10 days.  CONSULTATION:  Dr. Aldean Baker.  HISTORY OF PRESENT ILLNESS:  This is a 50 year old gentleman who was recently discharged on February 03, 2011, from the hospital after amputation of his left second toe after developing osteomyelitis of the toe.  As per patient, the patient was discharged in good condition, and then started to realize some worsening redness, pain, and swelling of his left foot.  At that time, the patient was discharged without any antibiotics and his wound had been healing appropriately.  Then suddenly the patient started to have increased redness, pain, and swelling of the left foot and the patient was diagnosed with cellulitis of his  left foot. 1. Cellulitis of the foot after amputation with increased drainage.     The patient had an x-ray, did not show any evidence of abscess     formation.  The patient started on broad-spectrum antibiotic and     mainly Zosyn and vancomycin.  Wound culture grew methicillin-     sensitive Staphylococcus aureus and accordingly antibiotic switched     to Ancef IV.  The patient tolerated really well.  His swelling and     redness improved.  Dr. Lajoyce Corners consulted and he did not recommend any     further surgical operation.  Per Dr. Lajoyce Corners, he could follow up with     him in the office and the patient will be discharged with     doxycycline 100 mg p.o. b.i.d. for 10 days, need to follow up with     Dr. Lajoyce Corners for further recommendation.  The patient has no fever, no     white blood cells. 2. Diabetes mellitus.  The patient has episode of hypoglycemia during     hospital stay and accordingly Amaryl decreased to 0.5 mg twice  daily, and we will advice the patient to follow up his CBG at home.     His hemoglobin C1 found to be 7.3. 3. Hypertension remained under good control.  Currently, we felt the     patient is medically stable to be discharged home to follow up with     Dr. Lajoyce Corners as an outpatient and follow up with his MD.     Lillybeth Tal Bosie Helper, MD     HIE/MEDQ  D:  02/15/2011  T:  02/16/2011  Job:  [id number]  cc:   Nadara Mustard, MD [facility]  Electronically Signed by Ebony Cargo MD on 03/12/2011 03:54:35 PM

## 2011-12-12 ENCOUNTER — Ambulatory Visit (HOSPITAL_BASED_OUTPATIENT_CLINIC_OR_DEPARTMENT_OTHER): Payer: Self-pay | Admitting: Physical Medicine & Rehabilitation

## 2011-12-12 ENCOUNTER — Encounter: Payer: Self-pay | Attending: Physical Medicine & Rehabilitation

## 2011-12-12 ENCOUNTER — Encounter: Payer: Self-pay | Admitting: Physical Medicine & Rehabilitation

## 2011-12-12 DIAGNOSIS — R209 Unspecified disturbances of skin sensation: Secondary | ICD-10-CM | POA: Insufficient documentation

## 2011-12-12 DIAGNOSIS — M62838 Other muscle spasm: Secondary | ICD-10-CM | POA: Insufficient documentation

## 2011-12-12 DIAGNOSIS — Z79899 Other long term (current) drug therapy: Secondary | ICD-10-CM | POA: Insufficient documentation

## 2011-12-12 DIAGNOSIS — M51379 Other intervertebral disc degeneration, lumbosacral region without mention of lumbar back pain or lower extremity pain: Secondary | ICD-10-CM | POA: Insufficient documentation

## 2011-12-12 DIAGNOSIS — M25559 Pain in unspecified hip: Secondary | ICD-10-CM | POA: Insufficient documentation

## 2011-12-12 DIAGNOSIS — M5136 Other intervertebral disc degeneration, lumbar region: Secondary | ICD-10-CM

## 2011-12-12 DIAGNOSIS — M5137 Other intervertebral disc degeneration, lumbosacral region: Secondary | ICD-10-CM | POA: Insufficient documentation

## 2011-12-12 DIAGNOSIS — R5381 Other malaise: Secondary | ICD-10-CM | POA: Insufficient documentation

## 2011-12-12 DIAGNOSIS — L97509 Non-pressure chronic ulcer of other part of unspecified foot with unspecified severity: Secondary | ICD-10-CM | POA: Insufficient documentation

## 2011-12-12 DIAGNOSIS — M5126 Other intervertebral disc displacement, lumbar region: Secondary | ICD-10-CM | POA: Insufficient documentation

## 2011-12-12 DIAGNOSIS — E1149 Type 2 diabetes mellitus with other diabetic neurological complication: Secondary | ICD-10-CM

## 2011-12-12 DIAGNOSIS — F3289 Other specified depressive episodes: Secondary | ICD-10-CM | POA: Insufficient documentation

## 2011-12-12 DIAGNOSIS — R262 Difficulty in walking, not elsewhere classified: Secondary | ICD-10-CM | POA: Insufficient documentation

## 2011-12-12 DIAGNOSIS — M549 Dorsalgia, unspecified: Secondary | ICD-10-CM | POA: Insufficient documentation

## 2011-12-12 DIAGNOSIS — F329 Major depressive disorder, single episode, unspecified: Secondary | ICD-10-CM | POA: Insufficient documentation

## 2011-12-12 DIAGNOSIS — M940 Chondrocostal junction syndrome [Tietze]: Secondary | ICD-10-CM | POA: Insufficient documentation

## 2011-12-12 DIAGNOSIS — E1142 Type 2 diabetes mellitus with diabetic polyneuropathy: Secondary | ICD-10-CM | POA: Insufficient documentation

## 2011-12-12 DIAGNOSIS — G909 Disorder of the autonomic nervous system, unspecified: Secondary | ICD-10-CM

## 2011-12-12 HISTORY — DX: Type 2 diabetes mellitus with diabetic polyneuropathy: E11.42

## 2011-12-12 LAB — POCT URINE DRUG SCREEN

## 2011-12-12 NOTE — Progress Notes (Signed)
Subjective:    Patient ID: Robert Burnett, male    DOB: Apr 11, 1961, 51 y.o.   MRN: [id number]  HPI History of back pain originating in 1998 while hunting, onset of left hip pain than back pain than torso pain. Saw neurosurgeon Dr. Danielle Dess no surgery was recommended. Patient was referred for pain management in [place]. Underwent spine injections including radiofrequency. Some help initially with injections however became ineffective by 2004. Has tried hydrocodone in the past which was not very effective. Gets partial relief with oxycodone but best relief with tramadol. The patient was hospitalized last year for foot surgery and had good relief with morphine.  Tried physical therapy on multiple locations without relief. Prior history of depression was treated with antidepressants denies current depression symptoms. Past medical history diabetes with good control, hypertension with blood pressures generally well controlled but elevated today. Gastroesophageal reflux controlled with Protonix   Past surgical history significant for toe amputation left second toe. Knee surgery on both knees in 1975 and 1988  Pain Inventory Average Pain 8 Pain Right Now 8 My pain is aching and feels like his torso is being crushed  In the last 24 hours, has pain interfered with the following? General activity 8 Relation with others 9 What TIME of day is your pain at its worst? All Day Sleep (in general) Fair  Pain is worse with: walking, bending, sitting and standing Pain improves with: medication Relief from Meds: 2  Mobility walk with assistance how many minutes can you walk? Varies from day to day  Function disabled: date disabled 2004  Neuro/Psych weakness numbness trouble walking spasms depression  Prior Studies x-rays CT/MRI MRI LUMBAR SPINE WITHOUT CONTRAST: 2007 Technique: Multiplanar and multiecho pulse sequences of the lumbar spine, to include the lower thoracic and upper sacral  regions, were obtained according to standard protocol without IV contrast.  Comparison: None available.  Findings: Normal alignment. No fracture. Conus medullaris is normal.  L1-2: Very small central disc protrusion.  L2-3: Small central disc protrusion and mild facet arthropathy.  L3-4: Moderately large broad based disc protrusion. There is mild facet arthropathy and mild central canal stenosis. Neural foramina are patent.  L4-5: Broad based disc protrusion moderately large. This is slightly eccentric to the right. There is mild facet and ligamentum flavum hypertrophy and mild central canal stenosis.  L5-S1: Small to moderate right-sided disc protrusion with some caudal migration of disc material. This is displacing the right S1 nerve root dorsally.  IMPRESSION:  1. Broad based disc protrusions at L3-4 and L4-5 with mild spinal stenosis.  2. Right-sided disc protrusion L5-S1 with some caudal migration of disc material, which is exerting mass effect on the right S1 nerve root.    Physicians Involved In Care PCP: Dr. Clelia Croft Neurosurgeon: Dr. Danielle Dess Orthopedist: Dr. Rennis Chris  Review of Systems Constitutional:  high blood sugar Gastrointestinal/Urinary:  nausea and poor appetite Cardio/Respiratory:  shortness of breath    Objective:   Physical Exam  Constitutional: He is oriented to person, place, and time. He appears well-developed and well-nourished.  HENT:  Head: Normocephalic and atraumatic.  Neck: Normal range of motion.  Cardiovascular: Normal rate, regular rhythm and normal heart sounds.   Pulmonary/Chest: Effort normal and breath sounds normal.  Abdominal: Soft. Bowel sounds are normal.  Musculoskeletal: He exhibits no edema.       Right hip: Normal.       Left hip: Normal.       Right knee: no tenderness found. No medial joint  line tenderness noted.       Left knee: He exhibits no effusion. no tenderness found. No medial joint line tenderness noted.       Right ankle: Normal.         Left ankle: Normal.       Lumbar back: He exhibits decreased range of motion.       Feet:  Neurological: He is alert and oriented to person, place, and time. He has normal strength. A sensory deficit is present. Gait normal.  Reflex Scores:      Tricep reflexes are 2+ on the right side and 2+ on the left side.      Bicep reflexes are 2+ on the right side and 2+ on the left side.      Brachioradialis reflexes are 2+ on the right side and 2+ on the left side.      Patellar reflexes are 2+ on the right side and 2+ on the left side.      Achilles reflexes are 1+ on the right side and 1+ on the left side.      Diminished proprioception in the left foot greater than right foot Intact light touch  Skin: Skin is warm and dry.       L third toe necrotic ulcer  Psychiatric: He has a normal mood and affect. His behavior is normal. Judgment and thought content normal.          Assessment & Plan:  1. Lumbar degenerative disc without evidence of radiculopathy. We discussed the role of pain medications and specifically discussed the narcotic analgesics may provide only a 10-30% improvement of pain in the long-term. His opioid risk score is 4 making him a fairly  low risk for narcotic analgesic misuse. We will check a urine drug screen. We discussed the role of physical therapy once his pain is under better control. He may benefit from aquatic therapy more so than land therapy. I don't think any interventional pain procedures will be helpful in this situation. There is a possibility that sacroiliac injections may be revisited. 2. Diabetes with neuropathy. He will have followup with his primary physician in terms of his diabetic control as well the ulcer on his left third toe. Given that he is on tramadol I would hesitate in starting any Cymbalta. A low dose tricyclic antidepressant may be helpful without causing serotonin syndrome 3. I'll see him back in approximately 3 weeks. We'll review his  urine drug screen prior to that time and he may need to stop by for his prescription. I would start him on MS Contin 15 mg 3 times per day.

## 2011-12-12 NOTE — Patient Instructions (Signed)
Chronic Back Pain When back pain lasts longer than 3 months, it is called chronic back pain.This pain can be frustrating, but the cause of the pain is rarely dangerous.People with chronic back pain often go through certain periods that are more intense (flare-ups). CAUSES Chronic back pain can be caused by wear and tear (degeneration) on different structures in your back. These structures may include bones, ligaments, or discs. This degeneration may result in more pressure being placed on the nerves that travel to your legs and feet. This can lead to pain traveling from the low back down the back of the legs. When pain lasts longer than 3 months, it is not unusual for people to experience anxiety or depression. Anxiety and depression can also contribute to low back pain. TREATMENT  Establish a regular exercise plan. This is critical to improving your functional level.   Have a self-management plan for when you flare-up. Flare-ups rarely require a medical visit. Regular exercise will help reduce the intensity and frequency of your flare-ups.   Manage how you feel about your back pain and the rest of your life. Anxiety, depression, and feeling that you cannot alter your back pain have been shown to make back pain more intense and debilitating.   Medicines should never be your only treatment. They should be used along with other treatments to help you return to a more active lifestyle.   Procedures such as injections or surgery may be helpful but are rarely necessary. You may be able to get the same results with physical therapy or chiropractic care.  HOME CARE INSTRUCTIONS  Avoid bending, heavy lifting, prolonged sitting, and activities which make the problem worse.   Continue normal activity as much as possible.   Take brief periods of rest throughout the day to reduce your pain during flare-ups.   Follow your back exercise rehabilitation program. This can help reduce symptoms and prevent  more pain.   Only take over-the-counter or prescription medicines as directed by your caregiver. Muscle relaxants are sometimes prescribed. Narcotic pain medicine is discouraged for long-term pain, since addiction is a possible outcome.   If you smoke, quit.   Eat healthy foods and maintain a recommended body weight.  SEEK IMMEDIATE MEDICAL CARE IF:   You have weakness or numbness in one of your legs or feet.   You have trouble controlling your bladder or bowels.   You develop nausea, vomiting, abdominal pain, shortness of breath, or fainting.  Document Released: 11/13/2004 Document Revised: 06/18/2011 Document Reviewed: 02/24/2011 Exit[employer] Patient Information 2012 [employer]. 

## 2011-12-19 ENCOUNTER — Ambulatory Visit: Payer: Self-pay | Admitting: Physical Medicine & Rehabilitation

## 2011-12-22 ENCOUNTER — Telehealth: Payer: Self-pay | Admitting: *Deleted

## 2011-12-22 NOTE — Telephone Encounter (Signed)
LM with pt to call back.

## 2011-12-22 NOTE — Telephone Encounter (Signed)
Message copied by Caryl Ada on Mon Dec 22, 2011 12:46 PM ------      Message from: Ernestina Columbia      Created: Fri Dec 19, 2011  4:48 PM       May start MS Contin CR 15mg  TID,      Have pt come in for nursing visit and sign an opioid consent form

## 2011-12-23 NOTE — Telephone Encounter (Signed)
Pt aware and Diane is to set up RN visit.

## 2011-12-24 ENCOUNTER — Encounter: Payer: Self-pay | Attending: Physical Medicine & Rehabilitation | Admitting: *Deleted

## 2011-12-24 ENCOUNTER — Encounter: Payer: Self-pay | Admitting: *Deleted

## 2011-12-24 VITALS — BP 147/99 | HR 87 | Resp 18 | Ht 72.05 in | Wt 225.0 lb

## 2011-12-24 DIAGNOSIS — E1149 Type 2 diabetes mellitus with other diabetic neurological complication: Secondary | ICD-10-CM | POA: Insufficient documentation

## 2011-12-24 DIAGNOSIS — G909 Disorder of the autonomic nervous system, unspecified: Secondary | ICD-10-CM

## 2011-12-24 DIAGNOSIS — M940 Chondrocostal junction syndrome [Tietze]: Secondary | ICD-10-CM

## 2011-12-24 DIAGNOSIS — E1142 Type 2 diabetes mellitus with diabetic polyneuropathy: Secondary | ICD-10-CM | POA: Insufficient documentation

## 2011-12-24 DIAGNOSIS — M5137 Other intervertebral disc degeneration, lumbosacral region: Secondary | ICD-10-CM

## 2011-12-24 DIAGNOSIS — M51379 Other intervertebral disc degeneration, lumbosacral region without mention of lumbar back pain or lower extremity pain: Secondary | ICD-10-CM | POA: Insufficient documentation

## 2011-12-24 MED ORDER — MORPHINE SULFATE CR 15 MG PO TB12: 15.0000 mg | ORAL_TABLET | Freq: Three times a day (TID) | ORAL | Status: DC

## 2011-12-24 NOTE — Progress Notes (Signed)
Controlled Substance Agreement and Opioid Informed Consent forms read and signed today. CSA reviewed w/ patient. Questions answered. Also had questions about the meaning of costochondritis as a diagnosis. Will provide some teaching materials on this if available.

## 2012-01-02 ENCOUNTER — Ambulatory Visit: Payer: Self-pay | Admitting: Physical Medicine & Rehabilitation

## 2012-01-06 ENCOUNTER — Encounter: Payer: Self-pay | Attending: Physical Medicine & Rehabilitation

## 2012-01-06 ENCOUNTER — Encounter: Payer: Self-pay | Admitting: Physical Medicine & Rehabilitation

## 2012-01-06 ENCOUNTER — Ambulatory Visit (HOSPITAL_BASED_OUTPATIENT_CLINIC_OR_DEPARTMENT_OTHER): Payer: Self-pay | Admitting: Physical Medicine & Rehabilitation

## 2012-01-06 VITALS — BP 162/99 | HR 75 | Resp 16 | Ht 72.0 in | Wt 221.2 lb

## 2012-01-06 DIAGNOSIS — M5126 Other intervertebral disc displacement, lumbar region: Secondary | ICD-10-CM | POA: Insufficient documentation

## 2012-01-06 DIAGNOSIS — M5137 Other intervertebral disc degeneration, lumbosacral region: Secondary | ICD-10-CM

## 2012-01-06 DIAGNOSIS — F329 Major depressive disorder, single episode, unspecified: Secondary | ICD-10-CM | POA: Insufficient documentation

## 2012-01-06 DIAGNOSIS — M62838 Other muscle spasm: Secondary | ICD-10-CM | POA: Insufficient documentation

## 2012-01-06 DIAGNOSIS — M51379 Other intervertebral disc degeneration, lumbosacral region without mention of lumbar back pain or lower extremity pain: Secondary | ICD-10-CM | POA: Insufficient documentation

## 2012-01-06 DIAGNOSIS — M549 Dorsalgia, unspecified: Secondary | ICD-10-CM | POA: Insufficient documentation

## 2012-01-06 DIAGNOSIS — R5381 Other malaise: Secondary | ICD-10-CM | POA: Insufficient documentation

## 2012-01-06 DIAGNOSIS — M25559 Pain in unspecified hip: Secondary | ICD-10-CM | POA: Insufficient documentation

## 2012-01-06 DIAGNOSIS — R262 Difficulty in walking, not elsewhere classified: Secondary | ICD-10-CM | POA: Insufficient documentation

## 2012-01-06 DIAGNOSIS — E1142 Type 2 diabetes mellitus with diabetic polyneuropathy: Secondary | ICD-10-CM | POA: Insufficient documentation

## 2012-01-06 DIAGNOSIS — E1149 Type 2 diabetes mellitus with other diabetic neurological complication: Secondary | ICD-10-CM | POA: Insufficient documentation

## 2012-01-06 DIAGNOSIS — L97509 Non-pressure chronic ulcer of other part of unspecified foot with unspecified severity: Secondary | ICD-10-CM | POA: Insufficient documentation

## 2012-01-06 DIAGNOSIS — R209 Unspecified disturbances of skin sensation: Secondary | ICD-10-CM | POA: Insufficient documentation

## 2012-01-06 DIAGNOSIS — M5136 Other intervertebral disc degeneration, lumbar region: Secondary | ICD-10-CM

## 2012-01-06 DIAGNOSIS — F3289 Other specified depressive episodes: Secondary | ICD-10-CM | POA: Insufficient documentation

## 2012-01-06 DIAGNOSIS — Z79899 Other long term (current) drug therapy: Secondary | ICD-10-CM | POA: Insufficient documentation

## 2012-01-06 MED ORDER — MORPHINE SULFATE CR 15 MG PO TB12: 15.0000 mg | ORAL_TABLET | Freq: Three times a day (TID) | ORAL | Status: DC

## 2012-01-06 NOTE — Progress Notes (Signed)
  Subjective:    Patient ID: Robert Burnett, male    DOB: [birth year], 51 y.o.   MRN: [id number]  HPI She was started on MS Contin 15 mg 3 times per day. Overall it is helping except when he has a bad day either due to weather or activity. He still uses the tramadol for breakthrough discomfort and it works better for his rib and chest pain then his back pain. Patient has had no significant leg pain for many years. Pain Inventory Average Pain 8 Pain Right Now 7 My pain is constant and aching  In the last 24 hours, has pain interfered with the following? General activity 7 Relation with others 7 Enjoyment of life 7 What TIME of day is your pain at its worst? all day long Sleep (in general) Fair  Pain is worse with: walking, bending, sitting and standing Pain improves with: medication Relief from Meds: 2  Mobility use a cane how many minutes can you walk? 30 ability to climb steps?  yes do you drive?  yes  Function disabled: date disabled 03/02/2003 I need assistance with the following:  can manage but with increased pain  Neuro/Psych weakness tingling trouble walking spasms  Prior Studies Any changes since last visit?  no  Physicians involved in your care Primary care Dr Norberto Sorenson Neurosurgeon Dr Danielle Dess Orthopedist Dr Rennis Chris      Review of Systems  Constitutional:       High blood sugar  Respiratory: Positive for shortness of breath.   Musculoskeletal: Positive for back pain and gait problem.  Neurological: Positive for weakness.  All other systems reviewed and are negative.       Objective:   Physical Exam  Constitutional: He is oriented to person, place, and time. He appears well-developed and well-nourished.  HENT:  Head: Normocephalic and atraumatic.  Eyes: EOM are normal. Pupils are equal, round, and reactive to light.  Neck: Normal range of motion.  Cardiovascular: Normal rate, regular rhythm and normal heart sounds.   Musculoskeletal:   Lumbar back: He exhibits decreased range of motion and pain. He exhibits no tenderness, no bony tenderness, no swelling, no edema and no deformity.  Neurological: He is alert and oriented to person, place, and time.  Psychiatric: He has a normal mood and affect. His behavior is normal. Judgment and thought content normal.      Assessment & Plan:  1. Lumbar degenerative disc with chronic pain. He's had decent results with MS Contin 15 mg 3 times per day we'll continue current dose. If he requires a dosage adjustment upward his next Would be 30 mg twice a day. He will continue his tramadol on a 3 times a day when necessary basis. 2. Patient complains of chest pain but it is relieved by Nexium. This sounds like it is more GI related. There also may be a costochondritis component I instructed him to follow up with his primary care physician

## 2012-01-06 NOTE — Patient Instructions (Signed)
Morphine sustained-release capsules What is this medicine? MORPHINE (MOR feen) is a pain reliever. It is used to treat moderate to severe pain that lasts for more than a few days. This medicine may be used for other purposes; ask your health care provider or pharmacist if you have questions. What should I tell my health care provider before I take this medicine? They need to know if you have any of these conditions: -brain tumor -drug abuse or addiction -head injury -heart disease -if you frequently drink alcohol-containing drinks -intestinal disease -kidney disease or problems urinating -kyphoscoliosis -liver disease -lung disease, asthma, or breathing problems -seizures -taken isocarboxazid, phenelzine, tranylcypromine, or selegiline in the past 2 weeks -an unusual or allergic reaction to morphine, other medicines, foods, dyes, or preservatives -pregnant or trying to get pregnant -breast-feeding How should I use this medicine? Take this medicine by mouth with a glass of water. Do not break, crush, or chew the capsules. Do not take a capsule that is not whole. A broken or crushed capsule can be very dangerous. You may get too much medicine. If the medicine upsets your stomach, take the medicine with food or milk. If you have problems swallowing the capsule, you may carefully open it and sprinkle the contents on a small amount of cold applesauce. Immediately swallow all of the applesauce. Do not save it for later. Do not chew the applesauce. Do not let the medicine dissolve in the applesauce. Rinse your mouth with a sip of water. None of the medicine should stay in your mouth. Swallow all of the medicine. Follow the directions on the prescription label. Take the medicine at the same time each day. Do not take more medicine than you are told to take. A special MedGuide will be given to you by the pharmacist with each prescription and refill. Be sure to read this information carefully each  time. Talk to your pediatrician regarding the use of this medicine in children. Special care may be needed. Overdosage: If you think you have taken too much of this medicine contact a poison control center or emergency room at once. NOTE: This medicine is only for you. Do not share this medicine with others. What if I miss a dose? If you miss a dose, take it as soon as you can. If it is almost time for your next dose, take only that dose. Do not take double or extra doses. What may interact with this medicine? Do not take this medicine with any of the following medications: -MAOIs like Carbex, Eldepryl, Marplan, Nardil, and Parnate This medicine may also interact with the following medications: -alcohol -antihistamines -barbiturates, like phenobarbital -medicines for depression, anxiety, or psychotic disturbances -medicines for pain -medicines for sleep -muscle relaxants -naltrexone, naloxone -other narcotic medicines -rifampin This list may not describe all possible interactions. Give your health care provider a list of all the medicines, herbs, non-prescription drugs, or dietary supplements you use. Also tell them if you smoke, drink alcohol, or use illegal drugs. Some items may interact with your medicine. What should I watch for while using this medicine? Tell your doctor or health care professional if your pain does not go away, if it gets worse, or if you have new or a different type of pain. You may develop tolerance to the medicine. Tolerance means that you will need a higher dose of the medicine for pain relief. Tolerance is normal and is expected if you take morphine for a long time. Do not suddenly stop taking your   medicine because you may develop a severe reaction. Your body becomes used to the medicine. This does NOT mean you are addicted. Addiction is a behavior related to getting and using a drug for a non-medical reason. If you have pain, you have a medical reason to take pain  medicine. Your doctor will tell you how much medicine to take. If your doctor wants you to stop the medicine, the dose will be slowly lowered over time to avoid any side effects. You may get drowsy or dizzy when you first start taking the medicine or change doses. Do not drive, use machinery, or do anything that may be dangerous until you know how the medicine affects you. Stand or sit up slowly. This medicine will cause constipation. Try to have a bowel movement at least every 2 to 3 days. If you do not have a bowel movement for 3 days, call your doctor or health care professional. Your mouth may get dry. Drinking water, chewing sugarless gum, or sucking on hard candy may help. See your dentist every 6 months. What side effects may I notice from receiving this medicine? Side effects that you should report to your doctor or health care professional as soon as possible: -allergic reactions like skin rash, itching or hives, swelling of the face, lips, or tongue -breathing problems -change in the amount of urine -confusion -fever, chills -hallucinations -feeling faint or lightheaded -seizures -slow or fast heartbeat Side effects that usually do not require medical attention (report to your doctor or health care professional if they continue or are bothersome): -constipation -dizzy, drowsy -headache -nausea, vomiting -pinpoint pupils -sweating This list may not describe all possible side effects. Call your doctor for medical advice about side effects. You may report side effects to FDA at 1-800-FDA-1088. Where should I keep my medicine? Keep out of the reach of children. This medicine can be abused. Keep your medicine in a safe place to protect it from theft. Do not share this medicine with anyone. Selling or giving away this medicine is dangerous and is against the law. Store at room temperature between 15 and 30 degrees C (59 and 86 degrees F). Protect from light. Keep container tightly  closed. Flush any unused medicines down the toilet. Do not use the medicine after the expiration date. NOTE: This sheet is a summary. It may not cover all possible information. If you have questions about this medicine, talk to your doctor, pharmacist, or health care provider.  2012, [employer]. (05/20/2011 10:41:28 AM) 

## 2012-01-20 ENCOUNTER — Encounter: Payer: Self-pay | Admitting: Physical Medicine & Rehabilitation

## 2012-02-03 ENCOUNTER — Encounter: Payer: Self-pay | Attending: Physical Medicine & Rehabilitation

## 2012-02-17 ENCOUNTER — Telehealth: Payer: Self-pay | Admitting: Physical Medicine & Rehabilitation

## 2012-02-17 MED ORDER — MORPHINE SULFATE ER 15 MG PO TBCR: 15.0000 mg | EXTENDED_RELEASE_TABLET | Freq: Three times a day (TID) | ORAL | Status: DC

## 2012-02-17 NOTE — Telephone Encounter (Signed)
Patient's father has been ill and missed appt in April.  Now has appt 03/04/12, but will be out of meds before.

## 2012-02-17 NOTE — Telephone Encounter (Signed)
Have pt come in for UDS and RN visit this week.

## 2012-02-18 NOTE — Telephone Encounter (Signed)
Left message for patient to call us back about medication request. (We need to inform Robert Burnett that the appt with nsg 03/04/12 has been changed to 02/20/12 with Dr Wynn Banker at 2pm. He must make this appt to get medication refill)

## 2012-02-19 NOTE — Telephone Encounter (Signed)
Appt rescheduled for 2:15 02/20/2012 and patient was notified by D. Leretha Dykes

## 2012-02-20 ENCOUNTER — Encounter: Payer: Self-pay | Attending: Physical Medicine & Rehabilitation

## 2012-02-20 ENCOUNTER — Encounter: Payer: Self-pay | Admitting: Physical Medicine & Rehabilitation

## 2012-02-20 ENCOUNTER — Ambulatory Visit (HOSPITAL_BASED_OUTPATIENT_CLINIC_OR_DEPARTMENT_OTHER): Payer: Self-pay | Admitting: Physical Medicine & Rehabilitation

## 2012-02-20 VITALS — BP 157/90 | HR 73 | Resp 16 | Ht 73.0 in | Wt 213.0 lb

## 2012-02-20 DIAGNOSIS — M25559 Pain in unspecified hip: Secondary | ICD-10-CM | POA: Insufficient documentation

## 2012-02-20 DIAGNOSIS — M51379 Other intervertebral disc degeneration, lumbosacral region without mention of lumbar back pain or lower extremity pain: Secondary | ICD-10-CM | POA: Insufficient documentation

## 2012-02-20 DIAGNOSIS — Z79899 Other long term (current) drug therapy: Secondary | ICD-10-CM | POA: Insufficient documentation

## 2012-02-20 DIAGNOSIS — E1142 Type 2 diabetes mellitus with diabetic polyneuropathy: Secondary | ICD-10-CM

## 2012-02-20 DIAGNOSIS — M940 Chondrocostal junction syndrome [Tietze]: Secondary | ICD-10-CM

## 2012-02-20 DIAGNOSIS — R209 Unspecified disturbances of skin sensation: Secondary | ICD-10-CM | POA: Insufficient documentation

## 2012-02-20 DIAGNOSIS — R262 Difficulty in walking, not elsewhere classified: Secondary | ICD-10-CM | POA: Insufficient documentation

## 2012-02-20 DIAGNOSIS — M549 Dorsalgia, unspecified: Secondary | ICD-10-CM | POA: Insufficient documentation

## 2012-02-20 DIAGNOSIS — F3289 Other specified depressive episodes: Secondary | ICD-10-CM | POA: Insufficient documentation

## 2012-02-20 DIAGNOSIS — R5381 Other malaise: Secondary | ICD-10-CM | POA: Insufficient documentation

## 2012-02-20 DIAGNOSIS — L97509 Non-pressure chronic ulcer of other part of unspecified foot with unspecified severity: Secondary | ICD-10-CM | POA: Insufficient documentation

## 2012-02-20 DIAGNOSIS — M5126 Other intervertebral disc displacement, lumbar region: Secondary | ICD-10-CM | POA: Insufficient documentation

## 2012-02-20 DIAGNOSIS — M5137 Other intervertebral disc degeneration, lumbosacral region: Secondary | ICD-10-CM

## 2012-02-20 DIAGNOSIS — M5136 Other intervertebral disc degeneration, lumbar region: Secondary | ICD-10-CM

## 2012-02-20 DIAGNOSIS — G909 Disorder of the autonomic nervous system, unspecified: Secondary | ICD-10-CM

## 2012-02-20 DIAGNOSIS — E1149 Type 2 diabetes mellitus with other diabetic neurological complication: Secondary | ICD-10-CM | POA: Insufficient documentation

## 2012-02-20 DIAGNOSIS — F329 Major depressive disorder, single episode, unspecified: Secondary | ICD-10-CM | POA: Insufficient documentation

## 2012-02-20 DIAGNOSIS — M62838 Other muscle spasm: Secondary | ICD-10-CM | POA: Insufficient documentation

## 2012-02-20 NOTE — Patient Instructions (Addendum)
Back Exercises Back exercises help treat and prevent back injuries. The goal is to increase your strength in your belly (abdominal) and back muscles. These exercises can also help with flexibility. Start these exercises when told by your doctor. HOME CARE Back exercises include: Pelvic Tilt.  Lie on your back with your knees bent. Tilt your pelvis until the lower part of your back is against the floor. Hold this position 5 to 10 sec. Repeat this exercise 5 to 10 times.  Knee to Chest.  Pull 1 knee up against your chest and hold for 20 to 30 seconds. Repeat this with the other knee. This may be done with the other leg straight or bent, whichever feels better. Then, pull both knees up against your chest.  Sit-Ups or Curl-Ups.  Bend your knees 90 degrees. Start with tilting your pelvis, and do a partial, slow sit-up. Only lift your upper half 30 to 45 degrees off the floor. Take at least 2 to 3 seonds for each sit-up. Do not do sit-ups with your knees out straight. If partial sit-ups are difficult, simply do the above but with only tightening your belly (abdominal) muscles and holding it as told.  Hip-Lift.  Lie on your back with your knees flexed 90 degrees. Push down with your feet and shoulders as you raise your hips 2 inches off the floor. Hold for 10 seconds, repeat 5 to 10 times.  Back Arches.  Lie on your stomach. Prop yourself up on bent elbows. Slowly press on your hands, causing an arch in your low back. Repeat 3 to 5 times.  Shoulder-Lifts.  Lie face down with arms beside your body. Keep hips and belly pressed to floor as you slowly lift your head and shoulders off the floor.  Do not overdo your exercises. Be careful in the beginning. Exercises may cause you some mild back discomfort. If the pain lasts for more than 15 minutes, stop the exercises until you see your doctor. Improvement with exercise for back problems is slow.  Document Released: 11/08/2010 Document Revised: 09/25/2011  Document Reviewed: 11/08/2010 Exit[employer] Patient Information 2012 [employer]. 

## 2012-02-20 NOTE — Progress Notes (Signed)
  Subjective:    Patient ID: Robert Burnett, male    DOB: 05/11/1961, 51 y.o.   MRN: [id number]  HPI No new issues. Overall his chest pains controlled by tramadol and his back pain is well-controlled by his MS Contin. No signs of aberrant drug behavior. Pain Inventory Average Pain 7 Pain Right Now 7 My pain is constant and aching  In the last 24 hours, has pain interfered with the following? General activity 8 Relation with others 8 Enjoyment of life 8 What TIME of day is your pain at its worst? no certain time Sleep (in general) Fair  Pain is worse with: walking, bending, sitting and standing Pain improves with: rest and medication Relief from Meds: 2  Mobility use a cane how many minutes can you walk? depends on day ability to climb steps?  yes do you drive?  yes  Function disabled: date disabled 02/2003  Neuro/Psych weakness trouble walking spasms  Prior Studies Any changes since last visit?  no  Physicians involved in your care Any changes since last visit?  no        Review of Systems  Constitutional:       High blood sugar  HENT: Negative.   Eyes: Negative.   Respiratory: Positive for shortness of breath.   Cardiovascular: Negative.   Gastrointestinal: Negative.   Genitourinary: Negative.   Musculoskeletal: Negative.   Skin: Negative.   Neurological: Negative.   Psychiatric/Behavioral: Negative.        Objective:   Physical Exam  Constitutional: He is oriented to person, place, and time. He appears well-developed and well-nourished.  Neurological: He is alert and oriented to person, place, and time. He has normal strength.  Psychiatric: He has a normal mood and affect. His behavior is normal. Judgment and thought content normal.    Back has tenderness to palpation at the L4, L5, and S1 paraspinals There is no tenderness to palpation over the sternum. Patient has chest tenderness and pain with deep breath. Shoulder range of motion is  full Lumbar range of motion is reduced with extension and with lateral bending and only mildly reduced with forward bending. Lower extremity strength is 5/5 in the hip flexors knee extensors and ankle dorsiflexors      Assessment & Plan:   1. 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 No evidence of drug misuse. 2. Costochondritis stable responding well to tramadol new line 3. Diabetic peripheral neuropathy

## 2012-03-18 ENCOUNTER — Encounter (HOSPITAL_BASED_OUTPATIENT_CLINIC_OR_DEPARTMENT_OTHER): Payer: Self-pay | Attending: Internal Medicine

## 2012-03-18 DIAGNOSIS — E1149 Type 2 diabetes mellitus with other diabetic neurological complication: Secondary | ICD-10-CM | POA: Insufficient documentation

## 2012-03-18 DIAGNOSIS — E1169 Type 2 diabetes mellitus with other specified complication: Secondary | ICD-10-CM | POA: Insufficient documentation

## 2012-03-18 DIAGNOSIS — M2040 Other hammer toe(s) (acquired), unspecified foot: Secondary | ICD-10-CM | POA: Insufficient documentation

## 2012-03-18 DIAGNOSIS — E1142 Type 2 diabetes mellitus with diabetic polyneuropathy: Secondary | ICD-10-CM | POA: Insufficient documentation

## 2012-03-18 DIAGNOSIS — L97509 Non-pressure chronic ulcer of other part of unspecified foot with unspecified severity: Secondary | ICD-10-CM | POA: Insufficient documentation

## 2012-03-18 DIAGNOSIS — S98139A Complete traumatic amputation of one unspecified lesser toe, initial encounter: Secondary | ICD-10-CM | POA: Insufficient documentation

## 2012-03-18 NOTE — Progress Notes (Signed)
[facility]  NAME:  Robert Burnett, Robert Burnett              ACCOUNT NO.:  [id number]  MEDICAL RECORD NO.:  [id number]      DATE OF BIRTH:  December 22, 1960  PHYSICIAN:  Maxwell Caul, M.D.      VISIT DATE:                                  OFFICE VISIT   Robert Burnett is a 51 year old gentleman referred from [facility].  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  He has developed a wound over the left 3rd toe over the last 2-3 weeks.  He is here for our review of this.  He apparently has been on Septra and Keflex for what was felt to be a cellulitis involving the left 3rd toe.  PHYSICAL EXAMINATION:  VITAL SIGNS:  Temperature is 98.2, pulse 85, respirations 18, blood pressure 130/77.  RESPIRATORY:  Clear air entry bilaterally.  CARDIAC:  Heart sounds are normal.  PERIPHERAL VASCULAR: ABIs in this clinic were 1.5 bilaterally.  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  The left 3rd toe has an ulcer on the plantar aspect, it is a hammertoe.  There is swelling to the DIP and really intense pain.  The cap of the toe was covered with a thick adherent eschar, this was debrided off.  IMPRESSION AND PLAN:  Diabetic foot ulcer as described, left 2nd toe plantar aspect.  Debridement done.  I have changed his antibiotics to Septra and clindamycin, although historically the previous round of antibiotics had helped.  We dressed the toe with silver alginate, Kerlix, and a toe sock.  We put him in a healing sandal.  An x-ray of the 2nd toe was ordered.  At this point, I am not completely certain about the viability of the 2nd toe.  As mentioned, I have changed his antibiotics, off-loaded this, and we will see him again in a week's time.          ______________________________ Maxwell Caul, M.D.     MGR/MEDQ  D:  03/18/2012  T:  03/18/2012  Job:  [id number]

## 2012-03-22 ENCOUNTER — Encounter: Payer: Self-pay | Attending: Physical Medicine & Rehabilitation | Admitting: Physical Medicine and Rehabilitation

## 2012-03-22 ENCOUNTER — Encounter: Payer: Self-pay | Admitting: Physical Medicine and Rehabilitation

## 2012-03-22 VITALS — BP 144/90 | HR 78 | Resp 16 | Ht 73.0 in | Wt 214.0 lb

## 2012-03-22 DIAGNOSIS — M545 Low back pain: Secondary | ICD-10-CM

## 2012-03-22 DIAGNOSIS — M51379 Other intervertebral disc degeneration, lumbosacral region without mention of lumbar back pain or lower extremity pain: Secondary | ICD-10-CM | POA: Insufficient documentation

## 2012-03-22 DIAGNOSIS — R0789 Other chest pain: Secondary | ICD-10-CM | POA: Insufficient documentation

## 2012-03-22 DIAGNOSIS — M5136 Other intervertebral disc degeneration, lumbar region: Secondary | ICD-10-CM

## 2012-03-22 DIAGNOSIS — E1142 Type 2 diabetes mellitus with diabetic polyneuropathy: Secondary | ICD-10-CM | POA: Insufficient documentation

## 2012-03-22 DIAGNOSIS — R109 Unspecified abdominal pain: Secondary | ICD-10-CM | POA: Insufficient documentation

## 2012-03-22 DIAGNOSIS — R0602 Shortness of breath: Secondary | ICD-10-CM | POA: Insufficient documentation

## 2012-03-22 DIAGNOSIS — E1149 Type 2 diabetes mellitus with other diabetic neurological complication: Secondary | ICD-10-CM | POA: Insufficient documentation

## 2012-03-22 DIAGNOSIS — M5137 Other intervertebral disc degeneration, lumbosacral region: Secondary | ICD-10-CM

## 2012-03-22 DIAGNOSIS — M549 Dorsalgia, unspecified: Secondary | ICD-10-CM | POA: Insufficient documentation

## 2012-03-22 DIAGNOSIS — M940 Chondrocostal junction syndrome [Tietze]: Secondary | ICD-10-CM | POA: Insufficient documentation

## 2012-03-22 MED ORDER — MORPHINE SULFATE ER 15 MG PO TBCR
15.0000 mg | EXTENDED_RELEASE_TABLET | Freq: Three times a day (TID) | ORAL | Status: DC
Start: 2012-03-22 — End: 2012-04-20

## 2012-03-22 MED ORDER — MORPHINE SULFATE ER 15 MG PO TBCR: 15.0000 mg | EXTENDED_RELEASE_TABLET | Freq: Three times a day (TID) | ORAL | Status: DC

## 2012-03-22 NOTE — Progress Notes (Deleted)
Subjective:    Patient ID: Robert Burnett, male    DOB: 19-May-1961, 51 y.o.   MRN: [id number]  HPI The patient complains about chronic low back  pain. The patient denies any radiation. The patient also complains about intermittend sternocostal pain. The problem has been stable . Pain Inventory Average Pain 8 Pain Right Now 9 My pain is aching and crushing  In the last 24 hours, has pain interfered with the following? General activity 8 Relation with others 8 Enjoyment of life 8 What TIME of day is your pain at its worst? All day Sleep (in general) Fair  Pain is worse with: walking, bending, sitting and standing Pain improves with: medication Relief from Meds: 2  Mobility use a cane ability to climb steps?  yes do you drive?  yes  Function disabled: date disabled 2004  Neuro/Psych weakness trouble walking spasms  Prior Studies Any changes since last visit?  no  Physicians involved in your care Any changes since last visit?  no   Family History  Problem Relation Age of Onset  . Hypertension Father   . Diabetes Father   . Cancer Father   . Diabetes Sister   . Cancer Sister   . Cancer Mother    History   Social History  . Marital Status: Single    Spouse Name: N/A    Number of Children: N/A  . Years of Education: N/A   Social History Main Topics  . Smoking status: Former Smoker -- 2.0 packs/day for 5 years    Types: Cigarettes    Quit date: 10/20/1998  . Smokeless tobacco: Former Neurosurgeon   Comment: used chewing tobacco for about a year at age 84  . Alcohol Use: No  . Drug Use: No  . Sexually Active: None   Other Topics Concern  . None   Social History Narrative  . None   Past Surgical History  Procedure Date  . Knee surgery 1975 and 1989   Past Medical History  Diagnosis Date  . Hyperlipidemia   . Diabetes mellitus     toe amp 4/12   BP 144/90  Pulse 78  Resp 16  Ht 6\' 1"  (1.854 m)  Wt 214 lb (97.07 kg)  BMI 28.23  kg/m2     Review of Systems  Constitutional: Negative.   HENT: Negative.   Eyes: Negative.   Respiratory: Positive for shortness of breath.   Cardiovascular: Positive for chest pain.  Gastrointestinal: Positive for abdominal pain.  Genitourinary: Negative.   Musculoskeletal: Positive for back pain.  Skin: Negative.   Hematological: Negative.   Psychiatric/Behavioral: Negative.        Objective:   Physical Exam  Constitutional: He is oriented to person, place, and time. He appears well-developed and well-nourished.  HENT:  Head: Normocephalic.  Neck: Neck supple.  Musculoskeletal: He exhibits tenderness.  Neurological: He is alert and oriented to person, place, and time.  Skin: Skin is warm and dry.  Psychiatric: He has a normal mood and affect.    Symmetric normal motor tone is noted throughout. Normal muscle bulk. Muscle testing reveals 5/5 muscle strength of the upper extremity, and 5/5 of the lower extremity. Full range of motion in upper and lower extremities. ROM of spine is restricted into extension. Fine motor movements are normal in both hands.  DTR in the upper and lower extremity are present and symmetric 2+. No clonus is noted.  Patient arises from chair with some difficulty. Narrow based gait with  cane, and forward flexed spine.         Assessment & Plan:  This is a 51 year old male with 1. Lumbar degenerative disc disease 2. Costochondritis 3. Low back pain 4. Diabetic peripheral neuropathy   Plan : Continue with medications and also continue with your exercise program. I also showed the patient some stretches for his costochondritis, and changed some of his exercises for his lower back.  Refilled his pain medication which controls his pain sufficiently. Followup in one month.

## 2012-03-22 NOTE — Patient Instructions (Signed)
Continue with your medication, continue with exercises, and do the stretches I showed today

## 2012-03-24 ENCOUNTER — Ambulatory Visit (HOSPITAL_COMMUNITY)
Admission: RE | Admit: 2012-03-24 | Discharge: 2012-03-24 | Disposition: A | Discharge status: Home / Self Care | Payer: Self-pay | Source: Ambulatory Visit | Attending: Internal Medicine | Admitting: Internal Medicine

## 2012-03-24 ENCOUNTER — Other Ambulatory Visit (HOSPITAL_BASED_OUTPATIENT_CLINIC_OR_DEPARTMENT_OTHER): Payer: Self-pay | Admitting: Internal Medicine

## 2012-03-24 DIAGNOSIS — R52 Pain, unspecified: Secondary | ICD-10-CM

## 2012-03-24 DIAGNOSIS — L089 Local infection of the skin and subcutaneous tissue, unspecified: Secondary | ICD-10-CM | POA: Insufficient documentation

## 2012-03-25 ENCOUNTER — Encounter (HOSPITAL_BASED_OUTPATIENT_CLINIC_OR_DEPARTMENT_OTHER): Payer: Self-pay | Attending: Internal Medicine

## 2012-03-25 DIAGNOSIS — E1149 Type 2 diabetes mellitus with other diabetic neurological complication: Secondary | ICD-10-CM | POA: Insufficient documentation

## 2012-03-25 DIAGNOSIS — E1142 Type 2 diabetes mellitus with diabetic polyneuropathy: Secondary | ICD-10-CM | POA: Insufficient documentation

## 2012-03-25 DIAGNOSIS — L97509 Non-pressure chronic ulcer of other part of unspecified foot with unspecified severity: Secondary | ICD-10-CM | POA: Insufficient documentation

## 2012-03-25 DIAGNOSIS — M2040 Other hammer toe(s) (acquired), unspecified foot: Secondary | ICD-10-CM | POA: Insufficient documentation

## 2012-03-25 DIAGNOSIS — S98139A Complete traumatic amputation of one unspecified lesser toe, initial encounter: Secondary | ICD-10-CM | POA: Insufficient documentation

## 2012-03-25 DIAGNOSIS — E1169 Type 2 diabetes mellitus with other specified complication: Secondary | ICD-10-CM | POA: Insufficient documentation

## 2012-04-15 ENCOUNTER — Encounter (HOSPITAL_BASED_OUTPATIENT_CLINIC_OR_DEPARTMENT_OTHER): Payer: Self-pay

## 2012-04-19 ENCOUNTER — Ambulatory Visit: Payer: Self-pay | Admitting: Physical Medicine & Rehabilitation

## 2012-04-20 ENCOUNTER — Ambulatory Visit (HOSPITAL_BASED_OUTPATIENT_CLINIC_OR_DEPARTMENT_OTHER): Payer: Self-pay | Admitting: Physical Medicine & Rehabilitation

## 2012-04-20 ENCOUNTER — Encounter: Payer: Self-pay | Attending: Physical Medicine & Rehabilitation

## 2012-04-20 ENCOUNTER — Encounter: Payer: Self-pay | Admitting: Physical Medicine & Rehabilitation

## 2012-04-20 VITALS — BP 152/70 | HR 55 | Resp 16 | Ht 73.0 in | Wt 211.0 lb

## 2012-04-20 DIAGNOSIS — M5136 Other intervertebral disc degeneration, lumbar region: Secondary | ICD-10-CM

## 2012-04-20 DIAGNOSIS — M62838 Other muscle spasm: Secondary | ICD-10-CM | POA: Insufficient documentation

## 2012-04-20 DIAGNOSIS — M549 Dorsalgia, unspecified: Secondary | ICD-10-CM | POA: Insufficient documentation

## 2012-04-20 DIAGNOSIS — L97509 Non-pressure chronic ulcer of other part of unspecified foot with unspecified severity: Secondary | ICD-10-CM | POA: Insufficient documentation

## 2012-04-20 DIAGNOSIS — M5137 Other intervertebral disc degeneration, lumbosacral region: Secondary | ICD-10-CM | POA: Insufficient documentation

## 2012-04-20 DIAGNOSIS — R209 Unspecified disturbances of skin sensation: Secondary | ICD-10-CM | POA: Insufficient documentation

## 2012-04-20 DIAGNOSIS — R5383 Other fatigue: Secondary | ICD-10-CM | POA: Insufficient documentation

## 2012-04-20 DIAGNOSIS — F3289 Other specified depressive episodes: Secondary | ICD-10-CM | POA: Insufficient documentation

## 2012-04-20 DIAGNOSIS — M51379 Other intervertebral disc degeneration, lumbosacral region without mention of lumbar back pain or lower extremity pain: Secondary | ICD-10-CM | POA: Insufficient documentation

## 2012-04-20 DIAGNOSIS — E1142 Type 2 diabetes mellitus with diabetic polyneuropathy: Secondary | ICD-10-CM

## 2012-04-20 DIAGNOSIS — G909 Disorder of the autonomic nervous system, unspecified: Secondary | ICD-10-CM

## 2012-04-20 DIAGNOSIS — E1149 Type 2 diabetes mellitus with other diabetic neurological complication: Secondary | ICD-10-CM

## 2012-04-20 DIAGNOSIS — M5126 Other intervertebral disc displacement, lumbar region: Secondary | ICD-10-CM | POA: Insufficient documentation

## 2012-04-20 DIAGNOSIS — Z79899 Other long term (current) drug therapy: Secondary | ICD-10-CM | POA: Insufficient documentation

## 2012-04-20 DIAGNOSIS — F329 Major depressive disorder, single episode, unspecified: Secondary | ICD-10-CM | POA: Insufficient documentation

## 2012-04-20 DIAGNOSIS — M940 Chondrocostal junction syndrome [Tietze]: Secondary | ICD-10-CM

## 2012-04-20 DIAGNOSIS — R262 Difficulty in walking, not elsewhere classified: Secondary | ICD-10-CM | POA: Insufficient documentation

## 2012-04-20 DIAGNOSIS — M25559 Pain in unspecified hip: Secondary | ICD-10-CM | POA: Insufficient documentation

## 2012-04-20 DIAGNOSIS — R5381 Other malaise: Secondary | ICD-10-CM | POA: Insufficient documentation

## 2012-04-20 MED ORDER — GABAPENTIN 100 MG PO CAPS: 100.0000 mg | ORAL_CAPSULE | Freq: Three times a day (TID) | ORAL | Status: DC

## 2012-04-20 MED ORDER — MORPHINE SULFATE ER 15 MG PO TBCR: 15.0000 mg | EXTENDED_RELEASE_TABLET | Freq: Three times a day (TID) | ORAL | Status: DC

## 2012-04-20 MED ORDER — TRAMADOL HCL 50 MG PO TABS: 100.0000 mg | ORAL_TABLET | Freq: Four times a day (QID) | ORAL | Status: DC | PRN

## 2012-04-20 NOTE — Patient Instructions (Signed)
You will start a new medication called gabapentin. This is for diabetic neuropathy pain. It may make you tired. If you cannot take it during the day just take it at night. You'll see me in one month. If the gabapentin is not helpful we may need to increase the dose

## 2012-04-20 NOTE — Progress Notes (Signed)
Subjective:    Patient ID: Robert Burnett, male    DOB: [birth year], 51 y.o.   MRN: [id number]  HPI Pain Inventory Average Pain 7-8 Pain Right Now 8 My pain is constant and aching  In the last 24 hours, has pain interfered with the following? General activity 6-8 Relation with others 0 Enjoyment of life 7-8 What TIME of day is your pain at its worst? All Day Sleep (in general) Fair  Pain is worse with: walking, bending, sitting and standing Pain improves with: medication Relief from Meds: None to a little  Mobility use a cane ability to climb steps?  yes do you drive?  yes  Function disabled: date disabled 2004  Neuro/Psych weakness trouble walking spasms  Prior Studies Any changes since last visit?  no  Physicians involved in your care Any changes since last visit?  no   Family History  Problem Relation Age of Onset  . Hypertension Father   . Diabetes Father   . Cancer Father   . Diabetes Sister   . Cancer Sister   . Cancer Mother    History   Social History  . Marital Status: Single    Spouse Name: N/A    Number of Children: N/A  . Years of Education: N/A   Social History Main Topics  . Smoking status: Former Smoker -- 2.0 packs/day for 5 years    Types: Cigarettes    Quit date: 10/20/1998  . Smokeless tobacco: Former Neurosurgeon   Comment: used chewing tobacco for about a year at age 8  . Alcohol Use: No  . Drug Use: No  . Sexually Active: None   Other Topics Concern  . None   Social History Narrative  . None   Past Surgical History  Procedure Date  . Knee surgery 1975 and 1989   Past Medical History  Diagnosis Date  . Hyperlipidemia   . Diabetes mellitus     toe amp 4/12   BP 152/70  Pulse 55  Resp 16  Ht 6\' 1"  (1.854 m)  Wt 211 lb (95.709 kg)  BMI 27.84 kg/m2  SpO2 98%      Review of Systems  HENT: Negative.   Eyes: Negative.   Respiratory: Negative.   Cardiovascular: Positive for chest pain.  Gastrointestinal:  Negative.   Genitourinary: Negative.   Musculoskeletal: Positive for myalgias, back pain, arthralgias and gait problem.  Skin: Negative.   Neurological: Positive for weakness.  Hematological: Negative.   Psychiatric/Behavioral: Negative.        Objective:   Physical Exam  Constitutional: He appears well-developed and well-nourished.  Musculoskeletal:       Lumbar back: He exhibits decreased range of motion and pain. He exhibits no tenderness and no spasm.       Left foot: He exhibits deformity.       Feet:       healed incision dorsum of left foot at the distal third metatarsal area Intrinsic atrophy of both feet  Neurological: He has normal strength. A sensory deficit is present. Gait normal.       Decreased sensation to pinprick in both feet to the distal leg area          Assessment & Plan:  1. 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 No evidence of drug misuse.  2. Costochondritis stable responding well to tramadol         3. Diabetic peripheral  neuropathy this is becoming more bothersome for him. Because he is on high-dose tramadol we cannot use Cymbalta. Instead we will try him on Neurontin. I will see him in one month

## 2012-04-29 ENCOUNTER — Encounter (HOSPITAL_BASED_OUTPATIENT_CLINIC_OR_DEPARTMENT_OTHER): Payer: Self-pay | Attending: Internal Medicine

## 2012-04-29 DIAGNOSIS — M2040 Other hammer toe(s) (acquired), unspecified foot: Secondary | ICD-10-CM | POA: Insufficient documentation

## 2012-04-29 DIAGNOSIS — L84 Corns and callosities: Secondary | ICD-10-CM | POA: Insufficient documentation

## 2012-04-29 DIAGNOSIS — E1169 Type 2 diabetes mellitus with other specified complication: Secondary | ICD-10-CM | POA: Insufficient documentation

## 2012-04-29 DIAGNOSIS — L97509 Non-pressure chronic ulcer of other part of unspecified foot with unspecified severity: Secondary | ICD-10-CM | POA: Insufficient documentation

## 2012-05-11 NOTE — Progress Notes (Signed)
Subjective:    Patient ID: Robert Burnett, male    DOB: 1961-09-01, 51 y.o.   MRN: [id number]  Abdominal Pain  Back Pain Associated symptoms include abdominal pain and chest pain.   The patient complains about chronic low back  pain. The patient denies any radiation. The patient also complains about intermittend sternocostal pain. The problem has been stable . Pain Inventory Average Pain 8 Pain Right Now 9 My pain is aching and crushing  In the last 24 hours, has pain interfered with the following? General activity 8 Relation with others 8 Enjoyment of life 8 What TIME of day is your pain at its worst? All day Sleep (in general) Fair  Pain is worse with: walking, bending, sitting and standing Pain improves with: medication Relief from Meds: 2  Mobility use a cane ability to climb steps?  yes do you drive?  yes  Function disabled: date disabled 2004  Neuro/Psych weakness trouble walking spasms  Prior Studies Any changes since last visit?  no  Physicians involved in your care Any changes since last visit?  no   Family History  Problem Relation Age of Onset  . Hypertension Father   . Diabetes Father   . Cancer Father   . Diabetes Sister   . Cancer Sister   . Cancer Mother    History   Social History  . Marital Status: Single    Spouse Name: N/A    Number of Children: N/A  . Years of Education: N/A   Social History Main Topics  . Smoking status: Former Smoker -- 2.0 packs/day for 5 years    Types: Cigarettes    Quit date: 10/20/1998  . Smokeless tobacco: Former Neurosurgeon   Comment: used chewing tobacco for about a year at age 76  . Alcohol Use: No  . Drug Use: No  . Sexually Active: None   Other Topics Concern  . None   Social History Narrative  . None   Past Surgical History  Procedure Date  . Knee surgery 1975 and 1989   Past Medical History  Diagnosis Date  . Hyperlipidemia   . Diabetes mellitus     toe amp 4/12   BP 144/90  Pulse  78  Resp 16  Ht 6\' 1"  (1.854 m)  Wt 214 lb (97.07 kg)  BMI 28.23 kg/m2     Review of Systems  Constitutional: Negative.   HENT: Negative.   Eyes: Negative.   Respiratory: Positive for shortness of breath.   Cardiovascular: Positive for chest pain.  Gastrointestinal: Positive for abdominal pain.  Genitourinary: Negative.   Musculoskeletal: Positive for back pain.  Skin: Negative.   Hematological: Negative.   Psychiatric/Behavioral: Negative.        Objective:   Physical Exam  Constitutional: He is oriented to person, place, and time. He appears well-developed and well-nourished.  HENT:  Head: Normocephalic.  Neck: Neck supple.  Musculoskeletal: He exhibits tenderness.  Neurological: He is alert and oriented to person, place, and time.  Skin: Skin is warm and dry.  Psychiatric: He has a normal mood and affect.    Symmetric normal motor tone is noted throughout. Normal muscle bulk. Muscle testing reveals 5/5 muscle strength of the upper extremity, and 5/5 of the lower extremity. Full range of motion in upper and lower extremities. ROM of spine is restricted into extension. Fine motor movements are normal in both hands.  DTR in the upper and lower extremity are present and symmetric 2+. No clonus  is noted.  Patient arises from chair with some difficulty. Narrow based gait with cane, and forward flexed spine.         Assessment & Plan:  This is a 51 year old male with 1. Lumbar degenerative disc disease 2. Costochondritis 3. Low back pain 4. Diabetic peripheral neuropathy   Plan : Continue with medications and also continue with your exercise program. I also showed the patient some stretches for his costochondritis, and changed some of his exercises for his lower back.  Refilled his pain medication which controls his pain sufficiently. Followup in one month.

## 2012-05-17 ENCOUNTER — Encounter: Payer: Self-pay | Admitting: Physical Medicine & Rehabilitation

## 2012-05-17 ENCOUNTER — Ambulatory Visit (HOSPITAL_BASED_OUTPATIENT_CLINIC_OR_DEPARTMENT_OTHER): Payer: Self-pay | Admitting: Physical Medicine & Rehabilitation

## 2012-05-17 VITALS — BP 142/93 | HR 67 | Resp 14 | Ht 72.0 in | Wt 209.0 lb

## 2012-05-17 DIAGNOSIS — E1142 Type 2 diabetes mellitus with diabetic polyneuropathy: Secondary | ICD-10-CM

## 2012-05-17 DIAGNOSIS — E1149 Type 2 diabetes mellitus with other diabetic neurological complication: Secondary | ICD-10-CM

## 2012-05-17 DIAGNOSIS — G909 Disorder of the autonomic nervous system, unspecified: Secondary | ICD-10-CM

## 2012-05-17 DIAGNOSIS — M5137 Other intervertebral disc degeneration, lumbosacral region: Secondary | ICD-10-CM

## 2012-05-17 DIAGNOSIS — M5136 Other intervertebral disc degeneration, lumbar region: Secondary | ICD-10-CM

## 2012-05-17 DIAGNOSIS — M940 Chondrocostal junction syndrome [Tietze]: Secondary | ICD-10-CM

## 2012-05-17 MED ORDER — TRAMADOL HCL 50 MG PO TABS: 100.0000 mg | ORAL_TABLET | Freq: Four times a day (QID) | ORAL | Status: DC | PRN

## 2012-05-17 MED ORDER — MORPHINE SULFATE ER 15 MG PO TBCR: 30.0000 mg | EXTENDED_RELEASE_TABLET | Freq: Two times a day (BID) | ORAL | Status: DC

## 2012-05-17 MED ORDER — MORPHINE SULFATE ER 30 MG PO TBCR: 30.0000 mg | EXTENDED_RELEASE_TABLET | Freq: Two times a day (BID) | ORAL | Status: DC

## 2012-05-17 NOTE — Progress Notes (Signed)
Subjective:    Patient ID: Robert Burnett, male    DOB: [birth year], 51 y.o.   MRN: [id number]  HPI Still goes to wound center for left foot wound Minimal positive effect from gabapentin Continues with significant back pain interfered with activity. Trying to take care of his father Pain Inventory Average Pain 8 Pain Right Now 8 My pain is constant and aching  In the last 24 hours, has pain interfered with the following? General activity 7 Relation with others 7 Enjoyment of life 7 What TIME of day is your pain at its worst? varies Sleep (in general) Fair  Pain is worse with: walking, bending, sitting and standing Pain improves with: medication Relief from Meds: 3  Mobility use a cane how many minutes can you walk? depends on pain ability to climb steps?  yes do you drive?  yes Do you have any goals in this area?  yes  Function disabled: date disabled 03/02/2003  Neuro/Psych weakness trouble walking spasms  Prior Studies Any changes since last visit?  no  Physicians involved in your care Any changes since last visit?  no   Family History  Problem Relation Age of Onset  . Hypertension Father   . Diabetes Father   . Cancer Father   . Diabetes Sister   . Cancer Sister   . Cancer Mother    History   Social History  . Marital Status: Single    Spouse Name: N/A    Number of Children: N/A  . Years of Education: N/A   Social History Main Topics  . Smoking status: Former Smoker -- 2.0 packs/day for 5 years    Types: Cigarettes    Quit date: 10/20/1998  . Smokeless tobacco: Former Neurosurgeon   Comment: used chewing tobacco for about a year at age 75  . Alcohol Use: No  . Drug Use: No  . Sexually Active: None   Other Topics Concern  . None   Social History Narrative  . None   Past Surgical History  Procedure Date  . Knee surgery 1975 and 1989   Past Medical History  Diagnosis Date  . Hyperlipidemia   . Diabetes mellitus     toe amp 4/12   BP  142/93  Pulse 67  Resp 14  Ht 6' (1.829 m)  Wt 209 lb (94.802 kg)  BMI 28.35 kg/m2  SpO2 99%     Review of Systems  Respiratory: Positive for shortness of breath.   Musculoskeletal: Positive for myalgias, back pain, arthralgias and gait problem.  Neurological: Positive for weakness.  All other systems reviewed and are negative.       Objective:   Physical Exam  Constitutional: He is oriented to person, place, and time. He appears well-developed and well-nourished.  HENT:  Head: Normocephalic and atraumatic.  Eyes: Conjunctivae and EOM are normal. Pupils are equal, round, and reactive to light.  Neurological: He is alert and oriented to person, place, and time. He has normal strength. No cranial nerve deficit. Gait abnormal.  Reflex Scores:      Tricep reflexes are 2+ on the right side and 2+ on the left side.      Bicep reflexes are 2+ on the right side and 2+ on the left side.      Brachioradialis reflexes are 2+ on the right side and 2+ on the left side.      Patellar reflexes are 2+ on the right side and 2+ on the left side.  Achilles reflexes are 2+ on the right side and 2+ on the left side.      Left foot cast shoe  Psychiatric: He has a normal mood and affect.          Assessment & Plan:  1. 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 No evidence of drug misuse.  2. Costochondritis stable responding well to tramadol 3. Diabetic peripheral neuropathy this is becoming more bothersome for him. Because he is on high-dose tramadol we cannot use Cymbalta. Instead we will try him on Neurontin.  I will see him in one month

## 2012-05-17 NOTE — Patient Instructions (Signed)
You are to take 2 tramadol 4 times per day or 240 tablets for one month You are to take 2 morphine tablets per day. This is a higher dose. Your doses now 30 mg Continue gabapentin 100 mg 3 times a day. Next visit we may need to increase your gabapentin dose You'll see me in 4

## 2012-05-17 NOTE — Addendum Note (Signed)
Addended by: Erick Colace on: 05/17/2012 01:13 PM   Modules accepted: Orders

## 2012-05-20 ENCOUNTER — Encounter (HOSPITAL_BASED_OUTPATIENT_CLINIC_OR_DEPARTMENT_OTHER): Payer: Self-pay | Attending: Internal Medicine

## 2012-05-20 DIAGNOSIS — E1169 Type 2 diabetes mellitus with other specified complication: Secondary | ICD-10-CM | POA: Insufficient documentation

## 2012-05-20 DIAGNOSIS — M2040 Other hammer toe(s) (acquired), unspecified foot: Secondary | ICD-10-CM | POA: Insufficient documentation

## 2012-05-20 DIAGNOSIS — L84 Corns and callosities: Secondary | ICD-10-CM | POA: Insufficient documentation

## 2012-05-20 DIAGNOSIS — L97509 Non-pressure chronic ulcer of other part of unspecified foot with unspecified severity: Secondary | ICD-10-CM | POA: Insufficient documentation

## 2012-05-21 ENCOUNTER — Ambulatory Visit: Payer: Self-pay | Admitting: Physical Medicine & Rehabilitation

## 2012-05-27 ENCOUNTER — Encounter (HOSPITAL_BASED_OUTPATIENT_CLINIC_OR_DEPARTMENT_OTHER): Payer: Self-pay

## 2012-06-15 ENCOUNTER — Encounter: Payer: Self-pay | Attending: Physical Medicine & Rehabilitation

## 2012-06-15 ENCOUNTER — Ambulatory Visit (HOSPITAL_BASED_OUTPATIENT_CLINIC_OR_DEPARTMENT_OTHER): Payer: Self-pay | Admitting: Physical Medicine & Rehabilitation

## 2012-06-15 ENCOUNTER — Encounter: Payer: Self-pay | Admitting: Physical Medicine & Rehabilitation

## 2012-06-15 VITALS — BP 168/83 | HR 59 | Resp 14 | Ht 71.0 in | Wt 207.0 lb

## 2012-06-15 DIAGNOSIS — E1142 Type 2 diabetes mellitus with diabetic polyneuropathy: Secondary | ICD-10-CM

## 2012-06-15 DIAGNOSIS — G909 Disorder of the autonomic nervous system, unspecified: Secondary | ICD-10-CM

## 2012-06-15 DIAGNOSIS — M940 Chondrocostal junction syndrome [Tietze]: Secondary | ICD-10-CM

## 2012-06-15 DIAGNOSIS — M51379 Other intervertebral disc degeneration, lumbosacral region without mention of lumbar back pain or lower extremity pain: Secondary | ICD-10-CM | POA: Insufficient documentation

## 2012-06-15 DIAGNOSIS — M5136 Other intervertebral disc degeneration, lumbar region: Secondary | ICD-10-CM

## 2012-06-15 DIAGNOSIS — R262 Difficulty in walking, not elsewhere classified: Secondary | ICD-10-CM | POA: Insufficient documentation

## 2012-06-15 DIAGNOSIS — R209 Unspecified disturbances of skin sensation: Secondary | ICD-10-CM | POA: Insufficient documentation

## 2012-06-15 DIAGNOSIS — L97509 Non-pressure chronic ulcer of other part of unspecified foot with unspecified severity: Secondary | ICD-10-CM | POA: Insufficient documentation

## 2012-06-15 DIAGNOSIS — M5126 Other intervertebral disc displacement, lumbar region: Secondary | ICD-10-CM | POA: Insufficient documentation

## 2012-06-15 DIAGNOSIS — Z79899 Other long term (current) drug therapy: Secondary | ICD-10-CM | POA: Insufficient documentation

## 2012-06-15 DIAGNOSIS — R5381 Other malaise: Secondary | ICD-10-CM | POA: Insufficient documentation

## 2012-06-15 DIAGNOSIS — M549 Dorsalgia, unspecified: Secondary | ICD-10-CM | POA: Insufficient documentation

## 2012-06-15 DIAGNOSIS — Z5181 Encounter for therapeutic drug level monitoring: Secondary | ICD-10-CM

## 2012-06-15 DIAGNOSIS — M62838 Other muscle spasm: Secondary | ICD-10-CM | POA: Insufficient documentation

## 2012-06-15 DIAGNOSIS — E1149 Type 2 diabetes mellitus with other diabetic neurological complication: Secondary | ICD-10-CM | POA: Insufficient documentation

## 2012-06-15 DIAGNOSIS — R5383 Other fatigue: Secondary | ICD-10-CM | POA: Insufficient documentation

## 2012-06-15 DIAGNOSIS — M5137 Other intervertebral disc degeneration, lumbosacral region: Secondary | ICD-10-CM | POA: Insufficient documentation

## 2012-06-15 DIAGNOSIS — F329 Major depressive disorder, single episode, unspecified: Secondary | ICD-10-CM | POA: Insufficient documentation

## 2012-06-15 DIAGNOSIS — M51369 Other intervertebral disc degeneration, lumbar region without mention of lumbar back pain or lower extremity pain: Secondary | ICD-10-CM

## 2012-06-15 DIAGNOSIS — F3289 Other specified depressive episodes: Secondary | ICD-10-CM | POA: Insufficient documentation

## 2012-06-15 DIAGNOSIS — M25559 Pain in unspecified hip: Secondary | ICD-10-CM | POA: Insufficient documentation

## 2012-06-15 MED ORDER — MORPHINE SULFATE ER 30 MG PO TBCR: 30.0000 mg | EXTENDED_RELEASE_TABLET | Freq: Two times a day (BID) | ORAL | Status: DC

## 2012-06-15 MED ORDER — GABAPENTIN 300 MG PO CAPS: 300.0000 mg | ORAL_CAPSULE | Freq: Three times a day (TID) | ORAL | Status: DC

## 2012-06-15 NOTE — Patient Instructions (Addendum)
You will see Robert Burnett next month. Please let her know how the increased dose of gabapentin is helping you. She may have to increase the dose again.

## 2012-06-15 NOTE — Progress Notes (Signed)
Subjective:    Patient ID: Robert Burnett, male    DOB: May 21, 1961, 51 y.o.   MRN: [id number]  HPI Patient is taking care of his father with terminal cancer. No new medical problems. The patient goes to the wound center for the left foot wound. He has noted no problems in the right foot Pain Inventory Average Pain 8 Pain Right Now 7 My pain is constant and aching  In the last 24 hours, has pain interfered with the following? General activity 5 Relation with others 5 Enjoyment of life 5 What TIME of day is your pain at its worst? varies Sleep (in general) Fair  Pain is worse with: walking, bending, sitting and standing Pain improves with: medication Relief from Meds: 2  Mobility use a cane how many minutes can you walk? depends on day ability to climb steps?  yes do you drive?  yes Do you have any goals in this area?  yes  Function disabled: date disabled 02/2003  Neuro/Psych weakness trouble walking spasms  Prior Studies Any changes since last visit?  no  Physicians involved in your care Any changes since last visit?  no   Family History  Problem Relation Age of Onset  . Hypertension Father   . Diabetes Father   . Cancer Father   . Diabetes Sister   . Cancer Sister   . Cancer Mother    History   Social History  . Marital Status: Single    Spouse Name: N/A    Number of Children: N/A  . Years of Education: N/A   Social History Main Topics  . Smoking status: Former Smoker -- 2.0 packs/day for 5 years    Types: Cigarettes    Quit date: 10/20/1998  . Smokeless tobacco: Former Neurosurgeon   Comment: used chewing tobacco for about a year at age 52  . Alcohol Use: No  . Drug Use: No  . Sexually Active: None   Other Topics Concern  . None   Social History Narrative  . None   Past Surgical History  Procedure Date  . Knee surgery 1975 and 1989   Past Medical History  Diagnosis Date  . Hyperlipidemia   . Diabetes mellitus     toe amp 4/12   BP  168/83  Pulse 59  Resp 14  Ht 5\' 11"  (1.803 m)  Wt 207 lb (93.895 kg)  BMI 28.87 kg/m2  SpO2 97%    Review of Systems  Respiratory: Positive for shortness of breath.   Musculoskeletal: Positive for myalgias, back pain, arthralgias and gait problem.  Neurological: Positive for weakness.  All other systems reviewed and are negative.       Objective:   Physical Exam  Constitutional: He is oriented to person, place, and time. He appears well-developed and well-nourished.  Musculoskeletal:       Lumbar back: He exhibits decreased range of motion and pain. He exhibits no tenderness, no edema and no deformity.       L foot cast shoe  Pain with extension and lateral bending  Neurological: He is alert and oriented to person, place, and time. He has normal strength. He displays abnormal reflex. A sensory deficit is present.       Decreased pinprick sensation below mid calf    Right foot is clean dry with good circulation good pulses no evidence of skin lesions. Decreased sensation to pinprick      Assessment & Plan:  1. Lumbar degenerative disc with chronic pain  syndrome responding well to MS Contin 30mg  2 times per day. No evidence of drug misuse.  2. Costochondritis stable responding well to tramadol 3. Diabetic peripheral neuropathy this is becoming more bothersome for him. Because he is on high-dose tramadol we cannot use Cymbalta. Instead we will increase Neurontin to 300mg .  PA visit in one month  I will see the patient back as needed for further medication adjustments

## 2012-06-24 ENCOUNTER — Encounter (HOSPITAL_BASED_OUTPATIENT_CLINIC_OR_DEPARTMENT_OTHER): Payer: Self-pay

## 2012-06-24 ENCOUNTER — Encounter (HOSPITAL_BASED_OUTPATIENT_CLINIC_OR_DEPARTMENT_OTHER): Payer: Self-pay | Attending: Internal Medicine

## 2012-06-24 DIAGNOSIS — L84 Corns and callosities: Secondary | ICD-10-CM | POA: Insufficient documentation

## 2012-06-24 DIAGNOSIS — E1169 Type 2 diabetes mellitus with other specified complication: Secondary | ICD-10-CM | POA: Insufficient documentation

## 2012-06-24 DIAGNOSIS — L97509 Non-pressure chronic ulcer of other part of unspecified foot with unspecified severity: Secondary | ICD-10-CM | POA: Insufficient documentation

## 2012-06-24 DIAGNOSIS — M2040 Other hammer toe(s) (acquired), unspecified foot: Secondary | ICD-10-CM | POA: Insufficient documentation

## 2012-06-25 ENCOUNTER — Encounter (HOSPITAL_BASED_OUTPATIENT_CLINIC_OR_DEPARTMENT_OTHER): Payer: Self-pay

## 2012-07-08 LAB — GLUCOSE, CAPILLARY: Glucose-Capillary: 126 mg/dL — ABNORMAL HIGH (ref 70–99)

## 2012-07-14 ENCOUNTER — Encounter: Payer: Self-pay | Attending: Physical Medicine and Rehabilitation | Admitting: Physical Medicine and Rehabilitation

## 2012-07-14 ENCOUNTER — Encounter: Payer: Self-pay | Admitting: Physical Medicine and Rehabilitation

## 2012-07-14 VITALS — BP 166/99 | HR 59 | Resp 14 | Ht 71.0 in | Wt 205.0 lb

## 2012-07-14 DIAGNOSIS — M51379 Other intervertebral disc degeneration, lumbosacral region without mention of lumbar back pain or lower extremity pain: Secondary | ICD-10-CM | POA: Insufficient documentation

## 2012-07-14 DIAGNOSIS — M5136 Other intervertebral disc degeneration, lumbar region: Secondary | ICD-10-CM

## 2012-07-14 DIAGNOSIS — G8929 Other chronic pain: Secondary | ICD-10-CM | POA: Insufficient documentation

## 2012-07-14 DIAGNOSIS — M940 Chondrocostal junction syndrome [Tietze]: Secondary | ICD-10-CM | POA: Insufficient documentation

## 2012-07-14 DIAGNOSIS — S91109A Unspecified open wound of unspecified toe(s) without damage to nail, initial encounter: Secondary | ICD-10-CM | POA: Insufficient documentation

## 2012-07-14 DIAGNOSIS — M5137 Other intervertebral disc degeneration, lumbosacral region: Secondary | ICD-10-CM

## 2012-07-14 DIAGNOSIS — M545 Low back pain, unspecified: Secondary | ICD-10-CM | POA: Insufficient documentation

## 2012-07-14 DIAGNOSIS — X58XXXA Exposure to other specified factors, initial encounter: Secondary | ICD-10-CM | POA: Insufficient documentation

## 2012-07-14 DIAGNOSIS — E1149 Type 2 diabetes mellitus with other diabetic neurological complication: Secondary | ICD-10-CM | POA: Insufficient documentation

## 2012-07-14 DIAGNOSIS — E1142 Type 2 diabetes mellitus with diabetic polyneuropathy: Secondary | ICD-10-CM | POA: Insufficient documentation

## 2012-07-14 MED ORDER — PANTOPRAZOLE SODIUM 40 MG PO TBEC: 40.0000 mg | DELAYED_RELEASE_TABLET | Freq: Every day | ORAL | Status: DC

## 2012-07-14 MED ORDER — MORPHINE SULFATE ER 30 MG PO TBCR: 30.0000 mg | EXTENDED_RELEASE_TABLET | Freq: Two times a day (BID) | ORAL | Status: DC

## 2012-07-14 NOTE — Patient Instructions (Signed)
Continue with exercising and walking as tolerated.

## 2012-07-14 NOTE — Progress Notes (Signed)
Subjective:    Patient ID: Robert Burnett, male    DOB: 1961-09-07, 51 y.o.   MRN: [id number]  HPI The patient complains about chronic low back pain. The patient denies any radiation. The patient also complains about intermittend sternocostal pain.  The problem has been stable, with having good and bad days, the medication is mostly controlling his pain. He states, that he is still following up with wound care once a week, for his open wound on the 3rd toe on the left . He also reports, that his father died of lung cancer last month.  Pain Inventory Average Pain 8 Pain Right Now 8 My pain is constant and aching  In the last 24 hours, has pain interfered with the following? General activity 8 Relation with others 8 Enjoyment of life 8 What TIME of day is your pain at its worst? varies Sleep (in general) Fair  Pain is worse with: walking, bending, sitting and standing Pain improves with: medication Relief from Meds: 3  Mobility use a cane how many minutes can you walk? varies daily ability to climb steps?  yes do you drive?  yes Do you have any goals in this area?  yes  Function disabled: date disabled 02/2003  Neuro/Psych weakness trouble walking spasms  Prior Studies Any changes since last visit?  no  Physicians involved in your care Any changes since last visit?  no   Family History  Problem Relation Age of Onset  . Hypertension Father   . Diabetes Father   . Cancer Father   . Diabetes Sister   . Cancer Sister   . Cancer Mother    History   Social History  . Marital Status: Single    Spouse Name: N/A    Number of Children: N/A  . Years of Education: N/A   Social History Main Topics  . Smoking status: Former Smoker -- 2.0 packs/day for 5 years    Types: Cigarettes    Quit date: 10/20/1998  . Smokeless tobacco: Former Neurosurgeon   Comment: used chewing tobacco for about a year at age 61  . Alcohol Use: No  . Drug Use: No  . Sexually Active: None    Other Topics Concern  . None   Social History Narrative  . None   Past Surgical History  Procedure Date  . Knee surgery 1975 and 1989   Past Medical History  Diagnosis Date  . Hyperlipidemia   . Diabetes mellitus     toe amp 4/12   BP 166/99  Pulse 59  Resp 14  Ht 5\' 11"  (1.803 m)  Wt 205 lb (92.987 kg)  BMI 28.59 kg/m2  SpO2 100%     Review of Systems  Respiratory: Positive for shortness of breath.   Musculoskeletal: Positive for myalgias, back pain, arthralgias and gait problem.  Neurological: Positive for weakness.  All other systems reviewed and are negative.       Objective:   Physical Exam Constitutional: He is oriented to person, place, and time. He appears well-developed and well-nourished.  HENT:  Head: Normocephalic.  Neck: Neck supple.  Musculoskeletal: He exhibits tenderness.  Neurological: He is alert and oriented to person, place, and time.  Skin: Skin is warm and dry.  Psychiatric: He has a normal mood and affect.   Symmetric normal motor tone is noted throughout. Normal muscle bulk. Muscle testing reveals 5/5 muscle strength of the upper extremity, and 5/5 of the lower extremity. Full range of motion in upper  and lower extremities. ROM of spine is restricted into extension. Fine motor movements are normal in both hands.  DTR in the upper and lower extremity are present and symmetric 2+. No clonus is noted.  Patient arises from chair with some difficulty. Narrow based gait with cane, and forward flexed spine.  Open wound with some gangrene on the top of his 3rd toe on the left, seems to be healing.        Assessment & Plan:  This is a 51 year old male with  1. Lumbar degenerative disc disease  2. Costochondritis  3. Low back pain  4. Diabetic peripheral neuropathy  5. Open wound on top of 3rd left toe. Plan : Continue with medications and also continue with your exercise program. I also showed the patient some stretches for his  costochondritis, and changed some of his exercises for his lower back.  Refilled his pain medication which controls his pain sufficiently. Also refilled his protonix, he was seen in the health center before, which closed, and then his father died and he had to organize several things, and could not find a new PCP. Followup in one month.

## 2012-07-15 LAB — GLUCOSE, CAPILLARY: Glucose-Capillary: 98 mg/dL (ref 70–99)

## 2012-07-22 ENCOUNTER — Encounter (HOSPITAL_BASED_OUTPATIENT_CLINIC_OR_DEPARTMENT_OTHER): Payer: Self-pay

## 2012-07-22 ENCOUNTER — Encounter (HOSPITAL_BASED_OUTPATIENT_CLINIC_OR_DEPARTMENT_OTHER): Payer: Self-pay | Attending: Internal Medicine

## 2012-07-22 DIAGNOSIS — E1169 Type 2 diabetes mellitus with other specified complication: Secondary | ICD-10-CM | POA: Insufficient documentation

## 2012-07-22 DIAGNOSIS — L97509 Non-pressure chronic ulcer of other part of unspecified foot with unspecified severity: Secondary | ICD-10-CM | POA: Insufficient documentation

## 2012-07-23 ENCOUNTER — Encounter (HOSPITAL_BASED_OUTPATIENT_CLINIC_OR_DEPARTMENT_OTHER): Payer: Self-pay

## 2012-08-13 ENCOUNTER — Ambulatory Visit: Payer: Self-pay | Admitting: Physical Medicine and Rehabilitation

## 2012-08-23 ENCOUNTER — Encounter: Payer: Self-pay | Admitting: Physical Medicine and Rehabilitation

## 2012-08-23 ENCOUNTER — Encounter: Payer: Self-pay | Attending: Physical Medicine and Rehabilitation | Admitting: Physical Medicine and Rehabilitation

## 2012-08-23 VITALS — BP 146/97 | HR 72 | Resp 14 | Ht 73.0 in | Wt 209.4 lb

## 2012-08-23 DIAGNOSIS — M545 Low back pain, unspecified: Secondary | ICD-10-CM | POA: Insufficient documentation

## 2012-08-23 DIAGNOSIS — E1149 Type 2 diabetes mellitus with other diabetic neurological complication: Secondary | ICD-10-CM | POA: Insufficient documentation

## 2012-08-23 DIAGNOSIS — E1142 Type 2 diabetes mellitus with diabetic polyneuropathy: Secondary | ICD-10-CM | POA: Insufficient documentation

## 2012-08-23 DIAGNOSIS — M5136 Other intervertebral disc degeneration, lumbar region: Secondary | ICD-10-CM

## 2012-08-23 DIAGNOSIS — M940 Chondrocostal junction syndrome [Tietze]: Secondary | ICD-10-CM | POA: Insufficient documentation

## 2012-08-23 DIAGNOSIS — M51379 Other intervertebral disc degeneration, lumbosacral region without mention of lumbar back pain or lower extremity pain: Secondary | ICD-10-CM | POA: Insufficient documentation

## 2012-08-23 DIAGNOSIS — M5137 Other intervertebral disc degeneration, lumbosacral region: Secondary | ICD-10-CM

## 2012-08-23 MED ORDER — GABAPENTIN 300 MG PO CAPS: 300.0000 mg | ORAL_CAPSULE | Freq: Three times a day (TID) | ORAL | Status: DC

## 2012-08-23 MED ORDER — MORPHINE SULFATE ER 30 MG PO TBCR: 30.0000 mg | EXTENDED_RELEASE_TABLET | Freq: Two times a day (BID) | ORAL | Status: DC

## 2012-08-23 NOTE — Patient Instructions (Signed)
Continue with walking and exercise program as tolerated 

## 2012-08-23 NOTE — Progress Notes (Signed)
Subjective:    Patient ID: Robert Burnett, male    DOB: 23-Sep-1961, 51 y.o.   MRN: [id number]  HPI The patient complains about chronic low back pain. The patient denies any radiation. The patient also complains about intermittend sternocostal pain.  The problem has been stable, with having good and bad days, the medication is mostly controlling his pain. He states, that he had his last appointment with wound care a week, and that the wound on his 3rd toe on the left has healed . He also reports, that his father died of lung cancer 2 month ago.   Pain Inventory Average Pain 7 Pain Right Now 7 My pain is aching and has been having "electric shocks" in neck when moves certain way.  In the last 24 hours, has pain interfered with the following? General activity 6 Relation with others 6 Enjoyment of life 6 What TIME of day is your pain at its worst? varies Sleep (in general) Fair  Pain is worse with: walking, bending, sitting and standing Pain improves with: medication Relief from Meds: 3  Mobility use a cane ability to climb steps?  yes do you drive?  yes  Function disabled: date disabled 2004  Neuro/Psych weakness trouble walking spasms  Prior Studies Any changes since last visit?  no  Physicians involved in your care Any changes since last visit?  no   Family History  Problem Relation Age of Onset  . Hypertension Father   . Diabetes Father   . Cancer Father   . Diabetes Sister   . Cancer Sister   . Cancer Mother    History   Social History  . Marital Status: Single    Spouse Name: N/A    Number of Children: N/A  . Years of Education: N/A   Social History Main Topics  . Smoking status: Former Smoker -- 2.0 packs/day for 5 years    Types: Cigarettes    Quit date: 10/20/1998  . Smokeless tobacco: Former Neurosurgeon     Comment: used chewing tobacco for about a year at age 18  . Alcohol Use: No  . Drug Use: No  . Sexually Active: None   Other Topics Concern    . None   Social History Narrative  . None   Past Surgical History  Procedure Date  . Knee surgery 1975 and 1989   Past Medical History  Diagnosis Date  . Hyperlipidemia   . Diabetes mellitus     toe amp 4/12   BP 146/97  Pulse 72  Resp 14  Ht 6\' 1"  (1.854 m)  Wt 209 lb 6.4 oz (94.983 kg)  BMI 27.63 kg/m2  SpO2 98%   Review of Systems  Respiratory: Positive for shortness of breath.   Musculoskeletal: Positive for gait problem.       Spasm  Neurological: Positive for weakness and numbness.  All other systems reviewed and are negative.       Objective:   Physical Exam Constitutional: He is oriented to person, place, and time. He appears well-developed and well-nourished.  HENT:  Head: Normocephalic.  Neck: Neck supple.  Musculoskeletal: He exhibits tenderness.  Neurological: He is alert and oriented to person, place, and time.  Skin: Skin is warm and dry.  Psychiatric: He has a normal mood and affect.  Symmetric normal motor tone is noted throughout. Normal muscle bulk. Muscle testing reveals 5/5 muscle strength of the upper extremity, and 5/5 of the lower extremity. Full range of motion in  upper and lower extremities. ROM of spine is restricted into extension. Fine motor movements are normal in both hands.  DTR in the upper and lower extremity are present and symmetric 2+. No clonus is noted.  Patient arises from chair with some difficulty. Narrow based gait with cane, and forward flexed spine.  Wound on the top of his 3rd toe on the left, has healed.        Assessment & Plan:  This is a 51 year old male with  1. Lumbar degenerative disc disease  2. Costochondritis  3. Low back pain  4. Diabetic peripheral neuropathy  5. Open wound on top of 3rd left toe, resolved.  Plan : Continue with medications and also continue with your exercise program. I also showed the patient some stretches for his costochondritis, and changed some of his exercises for his lower  back.  Refilled his pain medication which controls his pain sufficiently.  Followup in one month.

## 2012-09-20 ENCOUNTER — Encounter: Payer: Self-pay | Attending: Physical Medicine and Rehabilitation | Admitting: Physical Medicine and Rehabilitation

## 2012-09-20 ENCOUNTER — Encounter: Payer: Self-pay | Admitting: Physical Medicine and Rehabilitation

## 2012-09-20 VITALS — BP 141/81 | HR 59 | Resp 14 | Ht 73.0 in | Wt 205.4 lb

## 2012-09-20 DIAGNOSIS — M545 Low back pain, unspecified: Secondary | ICD-10-CM | POA: Insufficient documentation

## 2012-09-20 DIAGNOSIS — G8929 Other chronic pain: Secondary | ICD-10-CM | POA: Insufficient documentation

## 2012-09-20 DIAGNOSIS — M5137 Other intervertebral disc degeneration, lumbosacral region: Secondary | ICD-10-CM | POA: Insufficient documentation

## 2012-09-20 DIAGNOSIS — M5136 Other intervertebral disc degeneration, lumbar region: Secondary | ICD-10-CM

## 2012-09-20 DIAGNOSIS — S98139A Complete traumatic amputation of one unspecified lesser toe, initial encounter: Secondary | ICD-10-CM | POA: Insufficient documentation

## 2012-09-20 DIAGNOSIS — E1149 Type 2 diabetes mellitus with other diabetic neurological complication: Secondary | ICD-10-CM | POA: Insufficient documentation

## 2012-09-20 DIAGNOSIS — E1142 Type 2 diabetes mellitus with diabetic polyneuropathy: Secondary | ICD-10-CM | POA: Insufficient documentation

## 2012-09-20 DIAGNOSIS — E785 Hyperlipidemia, unspecified: Secondary | ICD-10-CM | POA: Insufficient documentation

## 2012-09-20 DIAGNOSIS — M940 Chondrocostal junction syndrome [Tietze]: Secondary | ICD-10-CM | POA: Insufficient documentation

## 2012-09-20 DIAGNOSIS — M51379 Other intervertebral disc degeneration, lumbosacral region without mention of lumbar back pain or lower extremity pain: Secondary | ICD-10-CM | POA: Insufficient documentation

## 2012-09-20 MED ORDER — MORPHINE SULFATE ER 30 MG PO TBCR: 30.0000 mg | EXTENDED_RELEASE_TABLET | Freq: Two times a day (BID) | ORAL | Status: DC

## 2012-09-20 MED ORDER — NAPROXEN 500 MG PO TABS: 500.0000 mg | ORAL_TABLET | Freq: Two times a day (BID) | ORAL | Status: DC

## 2012-09-20 MED ORDER — METHOCARBAMOL 500 MG PO TABS: 500.0000 mg | ORAL_TABLET | Freq: Three times a day (TID) | ORAL | Status: DC

## 2012-09-20 NOTE — Patient Instructions (Signed)
Continue with your exercise and walking program 

## 2012-09-20 NOTE — Progress Notes (Signed)
Subjective:    Patient ID: Robert Burnett, male    DOB: 1960/11/14, 51 y.o.   MRN: [id number]  HPI The patient complains about chronic low back pain. The patient denies any radiation. The patient also complains about intermittend sternocostal pain.  The problem has been stable, with having good and bad days, the medication is mostly controlling his pain, although he complains about some increasing pain recently, from his costochondritis. He states, that he had his last appointment with wound care a week, and that the wound on his 3rd toe on the left has healed . He also reports, that his father died of lung cancer 3 month ago.  Pain Inventory Average Pain 8 Pain Right Now 7 My pain is constant, aching and very intense tingling, crushing, severe pain. Very sore in ribs and chest  In the last 24 hours, has pain interfered with the following? General activity 7 Relation with others 6 Enjoyment of life 6 What TIME of day is your pain at its worst? all the time Sleep (in general) Fair  Pain is worse with: walking, bending, sitting and standing Pain improves with: medication Relief from Meds: 2  Mobility walk without assistance use a cane how many minutes can you walk? differs day to day ability to climb steps?  yes do you drive?  yes Do you have any goals in this area?  yes  Function disabled: date disabled 03/02/2003  Neuro/Psych weakness trouble walking spasms  Prior Studies Any changes since last visit?  no  Physicians involved in your care Any changes since last visit?  no   Family History  Problem Relation Age of Onset  . Hypertension Father   . Diabetes Father   . Cancer Father   . Diabetes Sister   . Cancer Sister   . Cancer Mother    History   Social History  . Marital Status: Single    Spouse Name: N/A    Number of Children: N/A  . Years of Education: N/A   Social History Main Topics  . Smoking status: Former Smoker -- 2.0 packs/day for 5 years   Types: Cigarettes    Quit date: 10/20/1998  . Smokeless tobacco: Former Neurosurgeon     Comment: used chewing tobacco for about a year at age 40  . Alcohol Use: No  . Drug Use: No  . Sexually Active: None   Other Topics Concern  . None   Social History Narrative  . None   Past Surgical History  Procedure Date  . Knee surgery 1975 and 1989   Past Medical History  Diagnosis Date  . Hyperlipidemia   . Diabetes mellitus     toe amp 4/12   BP 141/81  Pulse 59  Resp 14  Ht 6\' 1"  (1.854 m)  Wt 205 lb 6.4 oz (93.169 kg)  BMI 27.10 kg/m2  SpO2 97%    Review of Systems  Respiratory: Positive for shortness of breath.   Musculoskeletal: Positive for back pain and gait problem.  Neurological: Positive for weakness.       Spasms  All other systems reviewed and are negative.       Objective:   Physical Exam  Constitutional: He is oriented to person, place, and time. He appears well-developed and well-nourished.  HENT:  Head: Normocephalic.  Neck: Neck supple.  Musculoskeletal: He exhibits tenderness.  Neurological: He is alert and oriented to person, place, and time.  Skin: Skin is warm and dry.  Psychiatric: He has  a normal mood and affect.  Symmetric normal motor tone is noted throughout. Normal muscle bulk. Muscle testing reveals 5/5 muscle strength of the upper extremity, and 5/5 of the lower extremity. Full range of motion in upper and lower extremities. ROM of spine is restricted into extension. Fine motor movements are normal in both hands.  DTR in the upper and lower extremity are present and symmetric 2+. No clonus is noted.  Patient arises from chair with some difficulty. Narrow based gait with cane, and forward flexed spine.  Wound on the top of his 3rd toe on the left, has healed.       Assessment & Plan:  This is a 51 year old male with  1. Lumbar degenerative disc disease  2. Costochondritis  3. Low back pain  4. Diabetic peripheral neuropathy  5. Open  wound on top of 3rd left toe, resolved.  Plan : Continue with medications and also continue with your exercise program. I also showed the patient some stretches for his costochondritis, and changed some of his exercises for his lower back.  Refilled his pain medication which controls his pain most of the time, but recently they do not help that much anymore. Prescribed Naproxen for a month trial for his costochondritis. Also prescribed Robaxin for his muscle spasms. Will talk to Dr. Doroteo Bradford about possible increase of pain meds if Naproxen and Robaxin do not give sufficient relief. Followup in one month.

## 2012-09-22 ENCOUNTER — Telehealth: Payer: Self-pay | Admitting: Physical Medicine and Rehabilitation

## 2012-09-22 NOTE — Telephone Encounter (Signed)
Patient saw carisoprodol on his after visit summary and apparently is not taking it anymore and asked if we could take it off the med list.

## 2012-09-23 NOTE — Telephone Encounter (Signed)
Medication list was updated. Carisoprodol was taken off med list.

## 2012-09-23 NOTE — Addendum Note (Signed)
Addended by: Claiborne Rigg D on: 09/23/2012 10:01 AM   Modules accepted: Orders

## 2012-10-21 ENCOUNTER — Ambulatory Visit: Payer: Self-pay | Admitting: Physical Medicine and Rehabilitation

## 2012-11-04 ENCOUNTER — Encounter: Payer: Self-pay | Admitting: Physical Medicine and Rehabilitation

## 2012-11-04 ENCOUNTER — Encounter: Payer: Self-pay | Attending: Physical Medicine and Rehabilitation | Admitting: Physical Medicine and Rehabilitation

## 2012-11-04 VITALS — BP 168/87 | HR 65 | Resp 14 | Ht 73.0 in | Wt 215.0 lb

## 2012-11-04 DIAGNOSIS — M5414 Radiculopathy, thoracic region: Secondary | ICD-10-CM

## 2012-11-04 DIAGNOSIS — R071 Chest pain on breathing: Secondary | ICD-10-CM

## 2012-11-04 DIAGNOSIS — M51379 Other intervertebral disc degeneration, lumbosacral region without mention of lumbar back pain or lower extremity pain: Secondary | ICD-10-CM | POA: Insufficient documentation

## 2012-11-04 DIAGNOSIS — G629 Polyneuropathy, unspecified: Secondary | ICD-10-CM

## 2012-11-04 DIAGNOSIS — Z5181 Encounter for therapeutic drug level monitoring: Secondary | ICD-10-CM

## 2012-11-04 DIAGNOSIS — E1149 Type 2 diabetes mellitus with other diabetic neurological complication: Secondary | ICD-10-CM | POA: Insufficient documentation

## 2012-11-04 DIAGNOSIS — IMO0002 Reserved for concepts with insufficient information to code with codable children: Secondary | ICD-10-CM

## 2012-11-04 DIAGNOSIS — M940 Chondrocostal junction syndrome [Tietze]: Secondary | ICD-10-CM | POA: Insufficient documentation

## 2012-11-04 DIAGNOSIS — M5136 Other intervertebral disc degeneration, lumbar region: Secondary | ICD-10-CM

## 2012-11-04 DIAGNOSIS — G609 Hereditary and idiopathic neuropathy, unspecified: Secondary | ICD-10-CM

## 2012-11-04 DIAGNOSIS — M5137 Other intervertebral disc degeneration, lumbosacral region: Secondary | ICD-10-CM

## 2012-11-04 DIAGNOSIS — E1142 Type 2 diabetes mellitus with diabetic polyneuropathy: Secondary | ICD-10-CM | POA: Insufficient documentation

## 2012-11-04 MED ORDER — MORPHINE SULFATE ER 30 MG PO TBCR: 30.0000 mg | EXTENDED_RELEASE_TABLET | Freq: Two times a day (BID) | ORAL | Status: DC

## 2012-11-04 MED ORDER — MELOXICAM 15 MG PO TABS: 15.0000 mg | ORAL_TABLET | Freq: Every day | ORAL | Status: DC

## 2012-11-04 NOTE — Patient Instructions (Signed)
Continue with your stretching exercises.

## 2012-11-04 NOTE — Progress Notes (Signed)
Subjective:    Patient ID: Robert Burnett, male    DOB: Aug 08, 1961, 52 y.o.   MRN: [id number]  HPI The patient complains about chronic low back pain. The patient denies any radiation. The patient also complains about intermittend sternocostal pain.  The problem has been stable, with having good and bad days, the medication is mostly controlling his pain, although he complains about some increasing pain recently, from his costochondritis. He states, that he has pain in his entire thorax, which is constant and not depending on movement. He also reports, that his father died of lung cancer 5 month ago.  Pain Inventory Average Pain 8 Pain Right Now 9 My pain is constant, aching and very intense tight crushing sqeeing pain  In the last 24 hours, has pain interfered with the following? General activity 8 Relation with others 8 Enjoyment of life 8 What TIME of day is your pain at its worst? no specific time Sleep (in general) Fair  Pain is worse with: walking, bending, sitting and standing Pain improves with: pacing activities Relief from Meds: 2  Mobility use a cane how many minutes can you walk? differs daily ability to climb steps?  yes do you drive?  yes Do you have any goals in this area?  yes  Function disabled: date disabled 2004 Do you have any goals in this area?  yes  Neuro/Psych weakness numbness trouble walking spasms  Prior Studies Any changes since last visit?  no  Physicians involved in your care Any changes since last visit?  no   Family History  Problem Relation Age of Onset  . Hypertension Father   . Diabetes Father   . Cancer Father   . Diabetes Sister   . Cancer Sister   . Cancer Mother    History   Social History  . Marital Status: Single    Spouse Name: N/A    Number of Children: N/A  . Years of Education: N/A   Social History Main Topics  . Smoking status: Former Smoker -- 2.0 packs/day for 5 years    Types: Cigarettes    Quit date:  10/20/1998  . Smokeless tobacco: Former Neurosurgeon     Comment: used chewing tobacco for about a year at age 44  . Alcohol Use: No  . Drug Use: No  . Sexually Active: None   Other Topics Concern  . None   Social History Narrative  . None   Past Surgical History  Procedure Date  . Knee surgery 1975 and 1989   Past Medical History  Diagnosis Date  . Hyperlipidemia   . Diabetes mellitus     toe amp 4/12   BP 168/87  Pulse 65  Resp 14  Ht 6\' 1"  (1.854 m)  Wt 215 lb (97.523 kg)  BMI 28.37 kg/m2  SpO2 99%     Review of Systems  Respiratory: Positive for shortness of breath.   Musculoskeletal: Positive for myalgias, back pain, arthralgias and gait problem.  Neurological: Positive for weakness and numbness.  All other systems reviewed and are negative.       Objective:   Physical Exam  Constitutional: He is oriented to person, place, and time. He appears well-developed and well-nourished.  HENT:  Head: Normocephalic.  Neck: Neck supple.  Musculoskeletal: He exhibits tenderness.  Neurological: He is alert and oriented to person, place, and time.  Skin: Skin is warm and dry.  Psychiatric: He has a normal mood and affect.  Symmetric normal motor tone  is noted throughout. Normal muscle bulk. Muscle testing reveals 5/5 muscle strength of the upper extremity, and 5/5 of the lower extremity. Full range of motion in upper and lower extremities. ROM of spine is restricted into extension. Fine motor movements are normal in both hands, although patient has mild arthritic signes.  DTR in the upper and lower extremity are present and symmetric 2+. No clonus is noted.  Patient arises from chair with some difficulty. Narrow based gait with cane, and forward flexed spine.         Assessment & Plan:  This is a 52 year old male with  1. Lumbar degenerative disc disease  2. Costochondritis  3. Low back pain  4. Diabetic peripheral neuropathy  5. Open wound on top of 3rd left toe,  resolved.  Plan : Continue with medications and also continue with your exercise program. I also showed the patient some stretches for his costochondritis, and changed some of his exercises for his lower back.  Refilled his pain medication which controls his pain most of the time, but recently they do not help that much anymore. Prescribed Naproxen for a month trial for his costochondritis, this did not help much, changed to Mobic, will try this for one month.  His thoracic/costochondral pain is in his entire thorax and is not depending on movement. Order a x-ray of thoracic spine, if we do not find anything, consider referral to PCP, or rheumatologist. Followup in one month.

## 2012-11-11 ENCOUNTER — Telehealth: Payer: Self-pay | Admitting: *Deleted

## 2012-11-11 NOTE — Telephone Encounter (Signed)
Message copied by Sydnee Levans on Thu Nov 11, 2012 11:08 AM ------      Message from: Su Monks      Created: Wed Nov 10, 2012  9:44 AM       Please educate patient, will also talk to him at next visit, if he has alcohol in his UDS again, we will not prescribe narcotics anymore

## 2012-11-11 NOTE — Telephone Encounter (Signed)
Left message for patient to return call.

## 2012-11-17 NOTE — Telephone Encounter (Signed)
Patient advised not to consume alcohol while taking narcotics.  He understands.

## 2012-11-26 ENCOUNTER — Telehealth: Payer: Self-pay | Admitting: *Deleted

## 2012-11-26 MED ORDER — METHOCARBAMOL 500 MG PO TABS: 500.0000 mg | ORAL_TABLET | Freq: Three times a day (TID) | ORAL | Status: DC

## 2012-11-26 NOTE — Telephone Encounter (Signed)
Rx has been called in  

## 2012-12-02 ENCOUNTER — Encounter: Payer: Self-pay | Admitting: Physical Medicine and Rehabilitation

## 2012-12-06 ENCOUNTER — Encounter: Payer: Self-pay | Attending: Physical Medicine and Rehabilitation | Admitting: Physical Medicine and Rehabilitation

## 2012-12-06 ENCOUNTER — Encounter: Payer: Self-pay | Admitting: Physical Medicine and Rehabilitation

## 2012-12-06 VITALS — BP 184/102 | HR 65 | Resp 14 | Ht 73.0 in | Wt 215.0 lb

## 2012-12-06 DIAGNOSIS — M545 Low back pain, unspecified: Secondary | ICD-10-CM | POA: Insufficient documentation

## 2012-12-06 DIAGNOSIS — M940 Chondrocostal junction syndrome [Tietze]: Secondary | ICD-10-CM | POA: Insufficient documentation

## 2012-12-06 DIAGNOSIS — E1149 Type 2 diabetes mellitus with other diabetic neurological complication: Secondary | ICD-10-CM | POA: Insufficient documentation

## 2012-12-06 DIAGNOSIS — E1142 Type 2 diabetes mellitus with diabetic polyneuropathy: Secondary | ICD-10-CM | POA: Insufficient documentation

## 2012-12-06 DIAGNOSIS — M5136 Other intervertebral disc degeneration, lumbar region: Secondary | ICD-10-CM

## 2012-12-06 DIAGNOSIS — G8929 Other chronic pain: Secondary | ICD-10-CM | POA: Insufficient documentation

## 2012-12-06 DIAGNOSIS — M51379 Other intervertebral disc degeneration, lumbosacral region without mention of lumbar back pain or lower extremity pain: Secondary | ICD-10-CM | POA: Insufficient documentation

## 2012-12-06 DIAGNOSIS — M5137 Other intervertebral disc degeneration, lumbosacral region: Secondary | ICD-10-CM | POA: Insufficient documentation

## 2012-12-06 MED ORDER — MORPHINE SULFATE ER 30 MG PO TBCR: 30.0000 mg | EXTENDED_RELEASE_TABLET | Freq: Two times a day (BID) | ORAL | Status: DC

## 2012-12-06 NOTE — Patient Instructions (Addendum)
Stay as active as pain permits. Continue with your core exercises.

## 2012-12-06 NOTE — Progress Notes (Signed)
Subjective:    Patient ID: Robert Burnett, male    DOB: 16-May-1961, 52 y.o.   MRN: [id number]  HPI The patient complains about chronic low back pain. The patient denies any radiation. The patient also complains about intermittend sternocostal pain.  The problem has been stable, with having good and bad days, the medication is mostly controlling his pain, although he complains about some increasing pain recently, from his costochondritis. He states, that he has pain in his entire thorax, which is constant and not depending on movement. He also reports, that his father died of lung cancer 5 month ago.  Pain Inventory Average Pain 8 Pain Right Now 8 My pain is constant, aching and very intense crushing pain  In the last 24 hours, has pain interfered with the following? General activity 7 Relation with others 7 Enjoyment of life 8 What TIME of day is your pain at its worst? no specific time Sleep (in general) Fair  Pain is worse with: walking, bending, sitting and standing Pain improves with: medication Relief from Meds: 2  Mobility use a cane how many minutes can you walk? differs daily ability to climb steps?  yes do you drive?  yes Do you have any goals in this area?  yes  Function disabled: date disabled 2004  Neuro/Psych weakness numbness trouble walking spasms  Prior Studies Any changes since last visit?  no  Physicians involved in your care Any changes since last visit?  no   Family History  Problem Relation Age of Onset  . Hypertension Father   . Diabetes Father   . Cancer Father   . Diabetes Sister   . Cancer Sister   . Cancer Mother    History   Social History  . Marital Status: Single    Spouse Name: N/A    Number of Children: N/A  . Years of Education: N/A   Social History Main Topics  . Smoking status: Former Smoker -- 2.00 packs/day for 5 years    Types: Cigarettes    Quit date: 10/20/1998  . Smokeless tobacco: Former Neurosurgeon     Comment:  used chewing tobacco for about a year at age 19  . Alcohol Use: No  . Drug Use: No  . Sexually Active: None   Other Topics Concern  . None   Social History Narrative  . None   Past Surgical History  Procedure Laterality Date  . Knee surgery  1975 and 1989   Past Medical History  Diagnosis Date  . Hyperlipidemia   . Diabetes mellitus     toe amp 4/12   BP 184/102  Pulse 65  Resp 14  Ht 6\' 1"  (1.854 m)  Wt 215 lb (97.523 kg)  BMI 28.37 kg/m2  SpO2 99%     Review of Systems  Respiratory: Positive for shortness of breath.   Musculoskeletal: Positive for back pain and gait problem.  Neurological: Positive for weakness and numbness.  All other systems reviewed and are negative.       Objective:   Physical Exam Constitutional: He is oriented to person, place, and time. He appears well-developed and well-nourished.  HENT:  Head: Normocephalic.  Neck: Neck supple.  Musculoskeletal: He exhibits tenderness.  Neurological: He is alert and oriented to person, place, and time.  Skin: Skin is warm and dry.  Psychiatric: He has a normal mood and affect.  Symmetric normal motor tone is noted throughout. Normal muscle bulk. Muscle testing reveals 5/5 muscle strength of the  upper extremity, and 5/5 of the lower extremity. Full range of motion in upper and lower extremities. ROM of spine is restricted into extension. Fine motor movements are normal in both hands, although patient has mild arthritic signes.  DTR in the upper and lower extremity are present and symmetric 2+. No clonus is noted.  Patient arises from chair with some difficulty. Narrow based gait with cane, and forward flexed spine.         Assessment & Plan:  This is a 52 year old male with  1. Lumbar degenerative disc disease  2. Costochondritis  3. Low back pain  4. Diabetic peripheral neuropathy  5. Open wound on top of 3rd left toe, resolved.  Plan : Continue with medications and also continue with your  exercise program. I also showed the patient some stretches for his costochondritis, and changed some of his exercises for his lower back.  Refilled his pain medication which controls his pain most of the time, but recently they do not help that much anymore. Prescribed Naproxen for a month trial for his costochondritis, this did not help much, changed to Mobic, will try this for one month,did not help him either.  His thoracic/costochondral pain is in his entire thorax and is not depending on movement. Ordered a x-ray of thoracic spine again  today, if we do not find anything, consider referral to PCP, or rheumatologist.  Followup in one month.

## 2012-12-06 NOTE — Progress Notes (Signed)
Robert Burnett If you are having problems diagnostically on one of my pts, please set up next appt with me. Thanks AK

## 2012-12-31 ENCOUNTER — Ambulatory Visit: Payer: Self-pay | Admitting: Physical Medicine and Rehabilitation

## 2013-01-10 ENCOUNTER — Other Ambulatory Visit: Payer: Self-pay | Admitting: Physical Medicine and Rehabilitation

## 2013-01-10 ENCOUNTER — Ambulatory Visit
Admission: RE | Admit: 2013-01-10 | Discharge: 2013-01-10 | Disposition: A | Discharge status: Home / Self Care | Payer: No Typology Code available for payment source | Source: Ambulatory Visit | Attending: Physical Medicine and Rehabilitation | Admitting: Physical Medicine and Rehabilitation

## 2013-01-10 DIAGNOSIS — M940 Chondrocostal junction syndrome [Tietze]: Secondary | ICD-10-CM

## 2013-01-11 ENCOUNTER — Telehealth: Payer: Self-pay

## 2013-01-11 NOTE — Telephone Encounter (Signed)
Left message for patient to call office regarding xray results. 

## 2013-01-11 NOTE — Telephone Encounter (Signed)
Message copied by Judd Gaudier on Tue Jan 11, 2013  9:04 AM ------      Message from: Su Monks      Created: Mon Jan 10, 2013 12:06 PM       Please tell patient no acute findings, x-ray can be discussed in more detail at his next visit. ------

## 2013-01-14 ENCOUNTER — Encounter: Payer: Self-pay | Attending: Physical Medicine and Rehabilitation | Admitting: Physical Medicine and Rehabilitation

## 2013-01-14 ENCOUNTER — Encounter: Payer: Self-pay | Admitting: Physical Medicine and Rehabilitation

## 2013-01-14 VITALS — BP 137/72 | HR 67 | Resp 14 | Ht 73.0 in | Wt 207.2 lb

## 2013-01-14 DIAGNOSIS — M51379 Other intervertebral disc degeneration, lumbosacral region without mention of lumbar back pain or lower extremity pain: Secondary | ICD-10-CM

## 2013-01-14 DIAGNOSIS — M5137 Other intervertebral disc degeneration, lumbosacral region: Secondary | ICD-10-CM | POA: Insufficient documentation

## 2013-01-14 DIAGNOSIS — S98139A Complete traumatic amputation of one unspecified lesser toe, initial encounter: Secondary | ICD-10-CM | POA: Insufficient documentation

## 2013-01-14 DIAGNOSIS — M940 Chondrocostal junction syndrome [Tietze]: Secondary | ICD-10-CM | POA: Insufficient documentation

## 2013-01-14 DIAGNOSIS — M545 Low back pain, unspecified: Secondary | ICD-10-CM | POA: Insufficient documentation

## 2013-01-14 DIAGNOSIS — G8929 Other chronic pain: Secondary | ICD-10-CM | POA: Insufficient documentation

## 2013-01-14 DIAGNOSIS — M5136 Other intervertebral disc degeneration, lumbar region: Secondary | ICD-10-CM

## 2013-01-14 DIAGNOSIS — M47814 Spondylosis without myelopathy or radiculopathy, thoracic region: Secondary | ICD-10-CM

## 2013-01-14 DIAGNOSIS — E785 Hyperlipidemia, unspecified: Secondary | ICD-10-CM | POA: Insufficient documentation

## 2013-01-14 DIAGNOSIS — E1142 Type 2 diabetes mellitus with diabetic polyneuropathy: Secondary | ICD-10-CM | POA: Insufficient documentation

## 2013-01-14 DIAGNOSIS — E1149 Type 2 diabetes mellitus with other diabetic neurological complication: Secondary | ICD-10-CM | POA: Insufficient documentation

## 2013-01-14 MED ORDER — MORPHINE SULFATE ER 30 MG PO TBCR: 30.0000 mg | EXTENDED_RELEASE_TABLET | Freq: Two times a day (BID) | ORAL | Status: DC

## 2013-01-14 NOTE — Progress Notes (Signed)
Subjective:    Patient ID: Robert Burnett, male    DOB: 1960/12/20, 52 y.o.   MRN: [id number]  HPI The patient complains about chronic low back pain. The patient denies any radiation. The patient also complains about intermittend sternocostal pain.  The problem has been stable, with having good and bad days, the medication is mostly controlling his pain, although he complains about some increasing pain recently, from his costochondritis. He states, that he has pain in his entire thorax, which is constant and not depending on movement.  Pain Inventory Average Pain 8 Pain Right Now 8 My pain is constant  In the last 24 hours, has pain interfered with the following? General activity 9 Relation with others 9 Enjoyment of life 9 What TIME of day is your pain at its worst? varies Sleep (in general) Fair  Pain is worse with: walking, bending, sitting and standing Pain improves with: medication Relief from Meds: 2  Mobility use a cane ability to climb steps?  yes do you drive?  yes  Function disabled: date disabled 2004  Neuro/Psych weakness numbness trouble walking spasms  Prior Studies Any changes since last visit?  no  Physicians involved in your care Any changes since last visit?  no   Family History  Problem Relation Age of Onset  . Hypertension Father   . Diabetes Father   . Cancer Father   . Diabetes Sister   . Cancer Sister   . Cancer Mother    History   Social History  . Marital Status: Single    Spouse Name: N/A    Number of Children: N/A  . Years of Education: N/A   Social History Main Topics  . Smoking status: Former Smoker -- 2.00 packs/day for 5 years    Types: Cigarettes    Quit date: 10/20/1998  . Smokeless tobacco: Former Neurosurgeon     Comment: used chewing tobacco for about a year at age 63  . Alcohol Use: No  . Drug Use: No  . Sexually Active: None   Other Topics Concern  . None   Social History Narrative  . None   Past Surgical  History  Procedure Laterality Date  . Knee surgery  1975 and 1989   Past Medical History  Diagnosis Date  . Hyperlipidemia   . Diabetes mellitus     toe amp 4/12   BP 137/72  Pulse 67  Resp 14  Ht 6\' 1"  (1.854 m)  Wt 207 lb 3.2 oz (93.985 kg)  BMI 27.34 kg/m2  SpO2 98%    Review of Systems  Musculoskeletal: Positive for gait problem.       Spasms  Neurological: Positive for weakness and numbness.  All other systems reviewed and are negative.       Objective:   Physical Exam Constitutional: He is oriented to person, place, and time. He appears well-developed and well-nourished.  HENT:  Head: Normocephalic.  Neck: Neck supple.  Musculoskeletal: He exhibits tenderness.  Neurological: He is alert and oriented to person, place, and time.  Skin: Skin is warm and dry.  Psychiatric: He has a normal mood and affect.  Symmetric normal motor tone is noted throughout. Normal muscle bulk. Muscle testing reveals 5/5 muscle strength of the upper extremity, and 5/5 of the lower extremity. Full range of motion in upper and lower extremities. ROM of spine is restricted into extension. Fine motor movements are normal in both hands, although patient has mild arthritic signes.  DTR in the  upper and lower extremity are present and symmetric 2+. No clonus is noted.  Patient arises from chair with some difficulty. Narrow based gait with cane, and forward flexed spine.         Assessment & Plan:  This is a 52 year old male with  1. Lumbar degenerative disc disease  2. Costochondritis  3. Low back pain  4. Diabetic peripheral neuropathy  5. Open wound on top of 3rd left toe, resolved.  Plan : Continue with medications and also continue with your exercise program. I also showed the patient some stretches for his costochondritis, and changed some of his exercises for his lower back.  Refilled his pain medication which controls his pain most of the time, but recently they do not help that  much anymore. Prescribed Naproxen for a month trial for his costochondritis, this did not help much, changed to Mobic, tried this for one month,did not help him either.  His thoracic/costochondral pain is in his entire thorax and is not depending on movement. Ordered a x-ray of thoracic spine. X-ray showed severe spondylosis of T-spine, patient should continue with his exercise program and posture training. Consider referral to PCP, or rheumatologist for blood work to further evaluate. Discussed X-ray in great length. Followup in one month.

## 2013-01-14 NOTE — Patient Instructions (Signed)
Continue with your exercises and keeping a good posture.

## 2013-02-10 ENCOUNTER — Encounter: Payer: Self-pay | Attending: Physical Medicine and Rehabilitation | Admitting: Physical Medicine and Rehabilitation

## 2013-02-10 ENCOUNTER — Encounter: Payer: Self-pay | Admitting: Physical Medicine and Rehabilitation

## 2013-02-10 VITALS — BP 162/96 | HR 65 | Resp 14 | Ht 73.0 in | Wt 214.0 lb

## 2013-02-10 DIAGNOSIS — E1149 Type 2 diabetes mellitus with other diabetic neurological complication: Secondary | ICD-10-CM | POA: Insufficient documentation

## 2013-02-10 DIAGNOSIS — E1142 Type 2 diabetes mellitus with diabetic polyneuropathy: Secondary | ICD-10-CM | POA: Insufficient documentation

## 2013-02-10 DIAGNOSIS — M47817 Spondylosis without myelopathy or radiculopathy, lumbosacral region: Secondary | ICD-10-CM

## 2013-02-10 DIAGNOSIS — M51379 Other intervertebral disc degeneration, lumbosacral region without mention of lumbar back pain or lower extremity pain: Secondary | ICD-10-CM | POA: Insufficient documentation

## 2013-02-10 DIAGNOSIS — M5137 Other intervertebral disc degeneration, lumbosacral region: Secondary | ICD-10-CM | POA: Insufficient documentation

## 2013-02-10 DIAGNOSIS — M940 Chondrocostal junction syndrome [Tietze]: Secondary | ICD-10-CM | POA: Insufficient documentation

## 2013-02-10 DIAGNOSIS — M47814 Spondylosis without myelopathy or radiculopathy, thoracic region: Secondary | ICD-10-CM

## 2013-02-10 MED ORDER — NAPROXEN 500 MG PO TABS: 500.0000 mg | ORAL_TABLET | Freq: Two times a day (BID) | ORAL | Status: DC

## 2013-02-10 MED ORDER — METHOCARBAMOL 500 MG PO TABS: 500.0000 mg | ORAL_TABLET | Freq: Three times a day (TID) | ORAL | Status: DC

## 2013-02-10 MED ORDER — MORPHINE SULFATE ER 30 MG PO TBCR: 30.0000 mg | EXTENDED_RELEASE_TABLET | Freq: Two times a day (BID) | ORAL | Status: DC

## 2013-02-10 NOTE — Progress Notes (Signed)
Subjective:    Patient ID: Robert Burnett, male    DOB: 04-Dec-1960, 52 y.o.   MRN: [id number]  HPI The patient complains about chronic low back pain. The patient denies any radiation. The patient also complains about intermittend sternocostal pain.  The problem has been stable, with having good and bad days, the medication is mostly controlling his pain, although he complains about some increasing pain. He states, that he has pain in his entire thorax, which is constant and not depending on movement.  Pain Inventory Average Pain 8 Pain Right Now 7 My pain is constant and very intense crushing  In the last 24 hours, has pain interfered with the following? General activity 9 Relation with others 9 Enjoyment of life 9 What TIME of day is your pain at its worst? no specific time varies daily Sleep (in general) Fair  Pain is worse with: walking, bending, sitting and standing Pain improves with: medication Relief from Meds: 3  Mobility use a cane how many minutes can you walk? differs daily ability to climb steps?  yes do you drive?  yes Do you have any goals in this area?  yes  Function disabled: date disabled 2004  Neuro/Psych weakness numbness trouble walking spasms  Prior Studies x-rays CT/MRI  Physicians involved in your care Dr Danielle Dess, Dr Rennis Chris   Family History  Problem Relation Age of Onset  . Hypertension Father   . Diabetes Father   . Cancer Father   . Diabetes Sister   . Cancer Sister   . Cancer Mother    History   Social History  . Marital Status: Single    Spouse Name: N/A    Number of Children: N/A  . Years of Education: N/A   Social History Main Topics  . Smoking status: Former Smoker -- 2.00 packs/day for 5 years    Types: Cigarettes    Quit date: 10/20/1998  . Smokeless tobacco: Former Neurosurgeon     Comment: used chewing tobacco for about a year at age 70  . Alcohol Use: No  . Drug Use: No  . Sexually Active: None   Other Topics  Concern  . None   Social History Narrative  . None   Past Surgical History  Procedure Laterality Date  . Knee surgery  1975 and 1989   Past Medical History  Diagnosis Date  . Hyperlipidemia   . Diabetes mellitus     toe amp 4/12   BP 162/96  Pulse 65  Resp 14  Ht 6\' 1"  (1.854 m)  Wt 214 lb (97.07 kg)  BMI 28.24 kg/m2  SpO2 99%     Review of Systems  Respiratory: Positive for shortness of breath.   Musculoskeletal: Positive for gait problem.  Neurological: Positive for weakness and numbness.  All other systems reviewed and are negative.       Objective:   Physical Exam Constitutional: He is oriented to person, place, and time. He appears well-developed and well-nourished.  HENT:  Head: Normocephalic.  Neck: Neck supple.  Musculoskeletal: He exhibits tenderness.  Neurological: He is alert and oriented to person, place, and time.  Skin: Skin is warm and dry.  Psychiatric: He has a normal mood and affect.  Symmetric normal motor tone is noted throughout. Normal muscle bulk. Muscle testing reveals 5/5 muscle strength of the upper extremity, and 5/5 of the lower extremity. Full range of motion in upper and lower extremities. ROM of spine is restricted into extension. Fine motor movements are  normal in both hands, although patient has mild arthritic signes.  DTR in the upper and lower extremity are present and symmetric 2+. No clonus is noted.  Patient arises from chair with some difficulty. Narrow based gait with cane, and forward flexed spine.         Assessment & Plan:  This is a 52 year old male with  1. Lumbar degenerative disc disease  2. Severe spondylosis of thoracic spine 3. Low back pain  4. Diabetic peripheral neuropathy  5. Open wound on top of 3rd left toe, resolved.  Plan : Continue with medications and also continue with your exercise program. I also showed the patient some stretches for his costochondritis, and changed some of his exercises for  his lower back.  Refilled his pain medication which controls his pain most of the time, but recently they do not help that much anymore. Prescribed Naproxen for a month trial for his costochondritis, this did not help much, changed to Mobic, tried this for one month,did not help him either.  His thoracic/costochondral pain is in his entire thorax and is not depending on movement. Ordered a x-ray of thoracic spine. X-ray showed severe spondylosis of T-spine, patient should continue with his exercise program and posture training. Recommended again that the patient should see his PCP, and/ or consider referral to a rheumatologist for blood work, to further evaluate.   Followup in one month.

## 2013-02-10 NOTE — Patient Instructions (Signed)
Continue with staying as active as pain permits 

## 2013-03-10 ENCOUNTER — Encounter: Payer: Self-pay | Attending: Physical Medicine and Rehabilitation | Admitting: Physical Medicine and Rehabilitation

## 2013-03-10 ENCOUNTER — Encounter: Payer: Self-pay | Admitting: Physical Medicine and Rehabilitation

## 2013-03-10 VITALS — BP 143/77 | HR 49 | Resp 14 | Ht 73.0 in | Wt 207.0 lb

## 2013-03-10 DIAGNOSIS — M545 Low back pain, unspecified: Secondary | ICD-10-CM | POA: Insufficient documentation

## 2013-03-10 DIAGNOSIS — M51379 Other intervertebral disc degeneration, lumbosacral region without mention of lumbar back pain or lower extremity pain: Secondary | ICD-10-CM | POA: Insufficient documentation

## 2013-03-10 DIAGNOSIS — M47817 Spondylosis without myelopathy or radiculopathy, lumbosacral region: Secondary | ICD-10-CM

## 2013-03-10 DIAGNOSIS — M5137 Other intervertebral disc degeneration, lumbosacral region: Secondary | ICD-10-CM | POA: Insufficient documentation

## 2013-03-10 DIAGNOSIS — M47814 Spondylosis without myelopathy or radiculopathy, thoracic region: Secondary | ICD-10-CM | POA: Insufficient documentation

## 2013-03-10 DIAGNOSIS — E1149 Type 2 diabetes mellitus with other diabetic neurological complication: Secondary | ICD-10-CM | POA: Insufficient documentation

## 2013-03-10 DIAGNOSIS — E1142 Type 2 diabetes mellitus with diabetic polyneuropathy: Secondary | ICD-10-CM | POA: Insufficient documentation

## 2013-03-10 MED ORDER — GABAPENTIN 300 MG PO CAPS: 300.0000 mg | ORAL_CAPSULE | Freq: Three times a day (TID) | ORAL | Status: DC

## 2013-03-10 MED ORDER — MORPHINE SULFATE ER 30 MG PO TBCR: 30.0000 mg | EXTENDED_RELEASE_TABLET | Freq: Two times a day (BID) | ORAL | Status: DC

## 2013-03-10 NOTE — Progress Notes (Signed)
Subjective:    Patient ID: Robert Burnett, male    DOB: 30-Jun-1961, 52 y.o.   MRN: [id number]  HPI The patient complains about chronic low back pain. The patient denies any radiation. The patient also complains about intermittend sternocostal pain.  The problem has been stable, with having good and bad days, the medication is mostly controlling his pain, although he complains about some increasing pain. He states, that he has pain in his entire thorax, which is constant and not depending on movement.  Pain Inventory Average Pain 8 Pain Right Now 8 My pain is constant  In the last 24 hours, has pain interfered with the following? General activity 8 Relation with others 7 Enjoyment of life 8 What TIME of day is your pain at its worst? varies Sleep (in general) Fair  Pain is worse with: walking, bending, sitting and standing Pain improves with: medication Relief from Meds: 3  Mobility use a cane how many minutes can you walk? difficult ability to climb steps?  yes do you drive?  yes Do you have any goals in this area?  yes  Function disabled: date disabled 2004  Neuro/Psych weakness numbness trouble walking spasms  Prior Studies Any changes since last visit?  no  Physicians involved in your care Any changes since last visit?  no   Family History  Problem Relation Age of Onset  . Hypertension Father   . Diabetes Father   . Cancer Father   . Diabetes Sister   . Cancer Sister   . Cancer Mother    History   Social History  . Marital Status: Single    Spouse Name: N/A    Number of Children: N/A  . Years of Education: N/A   Social History Main Topics  . Smoking status: Former Smoker -- 2.00 packs/day for 5 years    Types: Cigarettes    Quit date: 10/20/1998  . Smokeless tobacco: Former Neurosurgeon     Comment: used chewing tobacco for about a year at age 58  . Alcohol Use: No  . Drug Use: No  . Sexually Active: None   Other Topics Concern  . None   Social  History Narrative  . None   Past Surgical History  Procedure Laterality Date  . Knee surgery  1975 and 1989   Past Medical History  Diagnosis Date  . Hyperlipidemia   . Diabetes mellitus     toe amp 4/12   BP 143/77  Pulse 49  Resp 14  Ht 6\' 1"  (1.854 m)  Wt 207 lb (93.895 kg)  BMI 27.32 kg/m2  SpO2 97%     Review of Systems  Respiratory: Positive for shortness of breath.   Musculoskeletal: Positive for gait problem.  Neurological: Positive for weakness and numbness.  All other systems reviewed and are negative.       Objective:   Physical Exam Constitutional: He is oriented to person, place, and time. He appears well-developed and well-nourished.  HENT:  Head: Normocephalic.  Neck: Neck supple.  Musculoskeletal: He exhibits tenderness.  Neurological: He is alert and oriented to person, place, and time.  Skin: Skin is warm and dry.  Psychiatric: He has a normal mood and affect.  Symmetric normal motor tone is noted throughout. Normal muscle bulk. Muscle testing reveals 5/5 muscle strength of the upper extremity, and 5/5 of the lower extremity. Full range of motion in upper and lower extremities. ROM of spine is restricted into extension. Fine motor movements are normal  in both hands, although patient has mild arthritic signes.  DTR in the upper and lower extremity are present and symmetric 2+. No clonus is noted.  Patient arises from chair with some difficulty. Narrow based gait with cane, and forward flexed spine.         Assessment & Plan:  This is a 52 year old male with  1. Lumbar degenerative disc disease  2. Severe spondylosis of thoracic spine  3. Low back pain  4. Diabetic peripheral neuropathy  5. Open wound on top of 3rd left toe, resolved.  Plan : Continue with medications and also continue with your exercise program. I also showed the patient some stretches for his costochondritis, and changed some of his exercises for his lower back.  Refilled  his pain medication which controls his pain most of the time, but recently they do not help that much anymore. Prescribed Naproxen for a month trial for his costochondritis, this did not help much, changed to Mobic, tried this for one month,did not help him either.  His thoracic/costochondral pain is in his entire thorax and is not depending on movement. Ordered a x-ray of thoracic spine. X-ray showed severe spondylosis of T-spine, patient should continue with his exercise program and posture training. He will follow up with his PCP, and/ or consider referral to a rheumatologist for blood work, to further evaluate.  Followup in one month.

## 2013-03-10 NOTE — Patient Instructions (Signed)
Continue with your exercise program, and your posture training

## 2013-03-16 ENCOUNTER — Ambulatory Visit: Payer: Self-pay | Admitting: Family Medicine

## 2013-03-16 VITALS — BP 130/78 | HR 54 | Temp 98.4°F | Resp 16 | Ht 73.0 in | Wt 206.0 lb

## 2013-03-16 DIAGNOSIS — M4714 Other spondylosis with myelopathy, thoracic region: Secondary | ICD-10-CM | POA: Insufficient documentation

## 2013-03-16 DIAGNOSIS — R0789 Other chest pain: Secondary | ICD-10-CM | POA: Insufficient documentation

## 2013-03-16 DIAGNOSIS — S91109A Unspecified open wound of unspecified toe(s) without damage to nail, initial encounter: Secondary | ICD-10-CM

## 2013-03-16 DIAGNOSIS — G8929 Other chronic pain: Secondary | ICD-10-CM

## 2013-03-16 DIAGNOSIS — G5600 Carpal tunnel syndrome, unspecified upper limb: Secondary | ICD-10-CM

## 2013-03-16 DIAGNOSIS — G894 Chronic pain syndrome: Secondary | ICD-10-CM

## 2013-03-16 DIAGNOSIS — E1149 Type 2 diabetes mellitus with other diabetic neurological complication: Secondary | ICD-10-CM

## 2013-03-16 DIAGNOSIS — E1142 Type 2 diabetes mellitus with diabetic polyneuropathy: Secondary | ICD-10-CM

## 2013-03-16 DIAGNOSIS — Z79899 Other long term (current) drug therapy: Secondary | ICD-10-CM

## 2013-03-16 DIAGNOSIS — M5136 Other intervertebral disc degeneration, lumbar region: Secondary | ICD-10-CM

## 2013-03-16 DIAGNOSIS — R5381 Other malaise: Secondary | ICD-10-CM | POA: Insufficient documentation

## 2013-03-16 DIAGNOSIS — E119 Type 2 diabetes mellitus without complications: Secondary | ICD-10-CM

## 2013-03-16 DIAGNOSIS — E11628 Type 2 diabetes mellitus with other skin complications: Secondary | ICD-10-CM | POA: Insufficient documentation

## 2013-03-16 DIAGNOSIS — R079 Chest pain, unspecified: Secondary | ICD-10-CM | POA: Insufficient documentation

## 2013-03-16 LAB — BASIC METABOLIC PANEL
Chloride: 102 mEq/L (ref 96–112)
Creat: 0.95 mg/dL (ref 0.50–1.35)
Potassium: 4.7 mEq/L (ref 3.5–5.3)

## 2013-03-16 LAB — POCT GLYCOSYLATED HEMOGLOBIN (HGB A1C): Hemoglobin A1C: 6.2

## 2013-03-16 MED ORDER — METFORMIN HCL 1000 MG PO TABS: 1000.0000 mg | ORAL_TABLET | Freq: Two times a day (BID) | ORAL | Status: DC

## 2013-03-16 MED ORDER — ATENOLOL 100 MG PO TABS: 100.0000 mg | ORAL_TABLET | Freq: Every day | ORAL | Status: DC

## 2013-03-16 MED ORDER — SULFAMETHOXAZOLE-TRIMETHOPRIM 800-160 MG PO TABS: 1.0000 | ORAL_TABLET | Freq: Two times a day (BID) | ORAL | Status: DC

## 2013-03-16 MED ORDER — PANTOPRAZOLE SODIUM 40 MG PO TBEC: 40.0000 mg | DELAYED_RELEASE_TABLET | Freq: Every day | ORAL | Status: DC

## 2013-03-16 MED ORDER — LOSARTAN POTASSIUM 100 MG PO TABS: 100.0000 mg | ORAL_TABLET | Freq: Every day | ORAL | Status: DC

## 2013-03-16 MED ORDER — CEPHALEXIN 500 MG PO CAPS: 500.0000 mg | ORAL_CAPSULE | Freq: Four times a day (QID) | ORAL | Status: DC

## 2013-03-16 NOTE — Patient Instructions (Addendum)
Carpal Tunnel Syndrome Carpal tunnel syndrome is a disorder of the nervous system in the wrist that causes pain, hand weakness, and/or loss of feeling. Carpal tunnel syndrome is caused by the compression, stretching, or irritation of the median nerve at the wrist joint. Athletes who experience carpal tunnel syndrome may notice a decrease in their performance to the condition, especially for sports that require strong hand or wrist action.  SYMPTOMS   Tingling, numbness, or burning pain in the hand or fingers.  Inability to sleep due to pain in the hand.  Sharp pains that shoot from the wrist up the arm or to the fingers, especially at night.  Morning stiffness or cramping of the hand.  Thumb weakness, resulting in difficulty holding objects or making a fist.  Shiny, dry skin on the hand.  Reduced performance in any sport requiring a strong grip. CAUSES   Median nerve damage at the wrist is caused by pressure due to swelling, inflammation, or scarred tissue.  Sources of pressure include:  Repetitive gripping or squeezing that causes inflammation of the tendon sheaths.  Scarring or shortening of the ligament that covers the median nerve.  Traumatic injury to the wrist or forearm such as fracture, sprain, or dislocation.  Prolonged hyperextension (wrist bent backward) or hyperflexion (wrist bent downward) of the wrist. RISK INCREASES WITH:  Diabetes mellitus.  Menopause or amenorrhea.  Rheumatoid arthritis.  Raynaud's disease.  Pregnancy.  Gout.  Kidney disease.  Ganglion cyst.  Repetitive hand or wrist action.  Hypothyroidism (underactive thyroid gland).  Repetitive jolting or shaking of the hands or wrist.  Prolonged forceful weight-bearing on the hands. PREVENTION  Bracing the hand and wrist straight during activities that involve repetitive grasping.  For activities that require prolonged extension of the wrist (bending towards the top of the forearm)  periodically change the position of your wrists.  Learn and use proper technique in activities that result in the wrist position in neutral to slight extension.  Avoid bending the wrist into full extension or flexion (up or down)  Keep the wrist in a straight (neutral) position. To keep the wrist in this position, wear a splint.  Avoid repetitive hand and wrist motions.  When possible avoid prolonged grasping of items (steering wheel of a car, a pen, a vacuum cleaner, or a rake).  Loosen your grip for activities that require prolonged grasping of items.  Place keyboards and writing surfaces at the correct height as to decrease strain on the wrist and hand.  Alternate work tasks to avoid prolonged wrist flexion.  Avoid pinching activities (needlework and writing) as they may irritate your carpal tunnel syndrome.  If these activities are necessary, complete them for shorter periods of time.  When writing, use a felt tip or roller ball pen and/or build up the grip on a pen to decrease the forces required for writing. PROGNOSIS  Carpal tunnel syndrome is usually curable with appropriate conservative treatment and sometimes resolves spontaneously. For some cases, surgery is necessary, especially if muscle wasting or nerve changes have developed.  RELATED COMPLICATIONS   Permanent numbness and a weak thumb or fingers in the affected hand.  Permanent paralysis of a portion of the hand and fingers. TREATMENT  Treatment initially consists of stopping activities that aggravate the symptoms as well as medication and ice to reduce inflammation. A wrist splint is often recommended for wear during activities of repetitive motion as well as at night. It is also important to learn and use proper  technique when performing activities that typically cause pain. On occasion, a corticosteroid injection may be given. If symptoms persist despite conservative treatment, surgery may be an option. Surgical  techniques free the pinched or compressed nerve. Carpal tunnel surgery is usually performed on an outpatient basis, meaning you go home the same day as surgery. These procedures provide almost complete relief of all symptoms in 95% of patients. Expect at least 2 weeks for healing after surgery. For cases that are the result of repeated jolting or shaking of the hand or wrist or prolonged hyperextension, surgery is not usually recommended, because stretching of the median nerve and not compression are usually the cause of carpal tunnel syndrome in these cases. MEDICATION   If pain medication is necessary, nonsteroidal anti-inflammatory medications, such as aspirin and ibuprofen, or other minor pain relievers, such as acetaminophen, are often recommended.  Do not take pain medication for 7 days before surgery.  Prescription pain relievers are usually only prescribed after surgery. Use only as directed and only as much as you need.  Corticosteroid injections may be given to reduce inflammation. However, they are not always recommended.  Vitamin B6 (pyridoxine) may reduce symptoms; use only if prescribed for your disorder. SEEK MEDICAL CARE IF:   Symptoms get worse or do not improve in 2 weeks despite treatment.  You also have a current or recent history of neck or shoulder injury that has resulted in pain or tingling elsewhere in your arm. Document Released: 10/06/2005 Document Revised: 12/29/2011 Document Reviewed: 01/18/2009 [facility] Patient Information 2014 [place], [place].  Diabetes and Foot Care Diabetes may cause you to have a poor blood supply (circulation) to your legs and feet. Because of this, the skin may be thinner, break easier, and heal more slowly. You also may have nerve damage in your legs and feet causing decreased feeling. You may not notice minor injuries to your feet that could lead to serious problems or infections. Taking care of your feet is one of the most important things  you can do for yourself.  HOME CARE INSTRUCTIONS  Do not go barefoot. Bare feet are easily injured.  Check your feet daily for blisters, cuts, and redness.  Wash your feet with warm water (not hot) and mild soap. Pat your feet and between your toes until completely dry.  Apply a moisturizing lotion that does not contain alcohol or petroleum jelly to the dry skin on your feet and to dry brittle toenails. Do not put it between your toes.  Trim your toenails straight across. Do not dig under them or around the cuticle.  Do not cut corns or calluses, or try to remove them with medicine.  Wear clean cotton socks or stockings every day. Make sure they are not too tight. Do not wear knee high stockings since they may decrease blood flow to your legs.  Wear leather shoes that fit properly and have enough cushioning. To break in new shoes, wear them just a few hours a day to avoid injuring your feet.  Wear shoes at all times, even in the house.  Do not cross your legs. This may decrease the blood flow to your feet.  If you find a minor scrape, cut, or break in the skin on your feet, keep it and the skin around it clean and dry. These areas may be cleansed with mild soap and water. Do not use peroxide, alcohol, iodine or Merthiolate.  When you remove an adhesive bandage, be sure not to harm the  skin around it.  If you have a wound, look at it several times a day to make sure it is healing.  Do not use heating pads or hot water bottles. Burns can occur. If you have lost feeling in your feet or legs, you may not know it is happening until it is too late.  Report any cuts, sores or bruises to your caregiver. Do not wait! SEEK MEDICAL CARE IF:   You have an injury that is not healing or you notice redness, numbness, burning, or tingling.  Your feet always feel cold.  You have pain or cramps in your legs and feet. SEEK IMMEDIATE MEDICAL CARE IF:   There is increasing redness, swelling, or  increasing pain in the wound.  There is a red line that goes up your leg.  Pus is coming from a wound.  You develop an unexplained oral temperature above 102 F (38.9 C), or as your caregiver suggests.  You notice a bad smell coming from an ulcer or wound. MAKE SURE YOU:   Understand these instructions.  Will watch your condition.  Will get help right away if you are not doing well or get worse. Document Released: 10/03/2000 Document Revised: 12/29/2011 Document Reviewed: 04/11/2009 [facility] Patient Information 2014 [place], [place].
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
## 2013-03-18 ENCOUNTER — Other Ambulatory Visit: Payer: Self-pay | Admitting: Family Medicine

## 2013-03-18 MED ORDER — CARISOPRODOL 350 MG PO TABS: 350.0000 mg | ORAL_TABLET | Freq: Four times a day (QID) | ORAL | Status: DC | PRN

## 2013-03-18 MED ORDER — MORPHINE SULFATE ER 30 MG PO TBCR: 30.0000 mg | EXTENDED_RELEASE_TABLET | Freq: Two times a day (BID) | ORAL | Status: DC

## 2013-03-18 NOTE — Progress Notes (Signed)
Discussed pt's care with Ms. Prueter of [facility].  They are limited in their ability to help this pt but the exorbatent facility fees for any injections (inc carpal tunnel). They also have been seeing pt monthly for his narcotic rxs which is costly. Agree w/ my taking over management for pt's narcotic therapy - pt will be pain contract here.

## 2013-04-06 ENCOUNTER — Encounter: Payer: Self-pay | Admitting: Physical Medicine and Rehabilitation

## 2013-05-16 ENCOUNTER — Telehealth: Payer: Self-pay

## 2013-05-16 MED ORDER — TRAMADOL HCL 50 MG PO TABS: 100.0000 mg | ORAL_TABLET | Freq: Four times a day (QID) | ORAL | Status: DC | PRN

## 2013-05-16 NOTE — Telephone Encounter (Signed)
Refill sent.

## 2013-05-16 NOTE — Telephone Encounter (Signed)
Pharm requests RF of tramadol 50 mg.

## 2013-06-08 ENCOUNTER — Ambulatory Visit: Payer: Self-pay | Admitting: Family Medicine

## 2013-06-08 ENCOUNTER — Ambulatory Visit: Payer: Self-pay

## 2013-06-08 VITALS — BP 132/78 | HR 63 | Temp 98.6°F | Resp 18 | Ht 73.0 in | Wt 203.6 lb

## 2013-06-08 DIAGNOSIS — M25559 Pain in unspecified hip: Secondary | ICD-10-CM

## 2013-06-08 DIAGNOSIS — G8929 Other chronic pain: Secondary | ICD-10-CM

## 2013-06-08 DIAGNOSIS — E1142 Type 2 diabetes mellitus with diabetic polyneuropathy: Secondary | ICD-10-CM

## 2013-06-08 DIAGNOSIS — G894 Chronic pain syndrome: Secondary | ICD-10-CM

## 2013-06-08 DIAGNOSIS — E1149 Type 2 diabetes mellitus with other diabetic neurological complication: Secondary | ICD-10-CM

## 2013-06-08 DIAGNOSIS — M25552 Pain in left hip: Secondary | ICD-10-CM

## 2013-06-08 DIAGNOSIS — R071 Chest pain on breathing: Secondary | ICD-10-CM

## 2013-06-08 DIAGNOSIS — R5381 Other malaise: Secondary | ICD-10-CM

## 2013-06-08 MED ORDER — MORPHINE SULFATE ER 30 MG PO TBCR: 30.0000 mg | EXTENDED_RELEASE_TABLET | Freq: Two times a day (BID) | ORAL | Status: DC

## 2013-06-08 MED ORDER — OXYCODONE HCL 5 MG PO TABS: 5.0000 mg | ORAL_TABLET | ORAL | Status: DC | PRN

## 2013-06-08 NOTE — Patient Instructions (Signed)
Call within 2 weeks if you do not here from [place] regarding your prior labs as to rheumatologic evaluation we have done in the past.  If your toes get worse, call us so we can repeat the antibiotics./

## 2013-06-08 NOTE — Progress Notes (Signed)
Subjective:    Patient ID: Robert Burnett, male    DOB: June 04, 1961, 52 y.o.   MRN: [id number] Chief Complaint  Patient presents with  . Follow-up    back and hip pain getting worse.  . Discuss medications  . Disability placard    Would like to discuss having paperwork done for this .  Marland Kitchen Diabetes Check    Would like to have toes checked     HPI  Toes do look better - much.  Is having severe pain in his left hip radiating from his gluteus down to his knee.  Multilevel disc degernation so couldn't just operate on one level - so did the epidurals which didn't help much.  Got rejected from disability as he hasn't worked in the last 5 yrs - called [employer].    Past Medical History  Diagnosis Date  . Hyperlipidemia   . Diabetes mellitus     toe amp 4/12  . Allergy   . Arthritis    Current Outpatient Prescriptions on File Prior to Visit  Medication Sig Dispense Refill  . aspirin 81 MG tablet Take 81 mg by mouth daily.      Marland Kitchen atenolol (TENORMIN) 100 MG tablet Take 1 tablet (100 mg total) by mouth daily.  90 tablet  3  . carisoprodol (SOMA) 350 MG tablet Take 1 tablet (350 mg total) by mouth 4 (four) times daily as needed for muscle spasms.  120 tablet  5  . fluticasone (FLONASE) 50 MCG/ACT nasal spray Place 2 sprays into the nose daily.      Marland Kitchen gabapentin (NEURONTIN) 300 MG capsule Take 1 capsule (300 mg total) by mouth 3 (three) times daily.  90 capsule  1  . loratadine (CLARITIN) 10 MG tablet Take 10 mg by mouth daily as needed.      Marland Kitchen losartan (COZAAR) 100 MG tablet Take 1 tablet (100 mg total) by mouth daily.  90 tablet  3  . metFORMIN (GLUCOPHAGE) 1000 MG tablet Take 1 tablet (1,000 mg total) by mouth 2 (two) times daily with a meal.  180 tablet  3  . morphine (MS CONTIN) 30 MG 12 hr tablet Take 1 tablet (30 mg total) by mouth 2 (two) times daily. May fill on or after 06/08/13.  60 tablet  0  . Multiple Vitamin (MULTIVITAMIN) tablet Take 1 tablet by mouth  daily.      . naproxen (NAPROSYN) 500 MG tablet Take 1 tablet (500 mg total) by mouth 2 (two) times daily with a meal.  60 tablet  3  . pantoprazole (PROTONIX) 40 MG tablet Take 1 tablet (40 mg total) by mouth daily.  30 tablet  5  . traMADol (ULTRAM) 50 MG tablet Take 2 tablets (100 mg total) by mouth every 6 (six) hours as needed for pain.  240 tablet  5  . cephALEXin (KEFLEX) 500 MG capsule Take 1 capsule (500 mg total) by mouth 4 (four) times daily.  56 capsule  0  . meloxicam (MOBIC) 15 MG tablet Take 1 tablet (15 mg total) by mouth daily.  30 tablet  1  . sulfamethoxazole-trimethoprim (BACTRIM DS,SEPTRA DS) 800-160 MG per tablet Take 1 tablet by mouth 2 (two) times daily.  28 tablet  0   No current facility-administered medications on file prior to visit.   Allergies  Allergen Reactions  . Prozac [Fluoxetine Hcl]   . Amlodipine Rash     Review of Systems  Constitutional: Positive for activity  change and fatigue. Negative for fever, chills, diaphoresis and appetite change.  Gastrointestinal: Negative for abdominal pain, diarrhea and constipation.  Genitourinary: Negative for urgency, frequency, decreased urine volume and difficulty urinating.  Musculoskeletal: Positive for arthralgias, back pain, gait problem, joint swelling and myalgias.  Skin: Negative for color change, rash and wound.  Neurological: Positive for weakness. Negative for dizziness, light-headedness and numbness.  Hematological: Negative for adenopathy. Does not bruise/bleed easily.      BP 132/78  Pulse 63  Temp(Src) 98.6 F (37 C) (Oral)  Resp 18  Ht 6\' 1"  (1.854 m)  Wt 203 lb 9.6 oz (92.352 kg)  BMI 26.87 kg/m2  SpO2 97% Objective:   Physical Exam  Constitutional: He is oriented to person, place, and time. He appears well-developed and well-nourished. No distress.  HENT:  Head: Normocephalic and atraumatic.  Eyes: Conjunctivae are normal. Pupils are equal, round, and reactive to light. No scleral  icterus.  Neck: Normal range of motion. Neck supple. No thyromegaly present.  Cardiovascular: Normal rate, regular rhythm, normal heart sounds and intact distal pulses.   Pulmonary/Chest: Effort normal and breath sounds normal. No respiratory distress.  Musculoskeletal: He exhibits no edema.  Lymphadenopathy:    He has no cervical adenopathy.  Neurological: He is alert and oriented to person, place, and time.  Skin: Skin is warm and dry. He is not diaphoretic.  Ulceration on hammer toe  Psychiatric: He has a normal mood and affect. His behavior is normal.      [facility] reading (PRIMARY) by  Dr. Clelia Croft. 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  Chronic pain syndrome  Debility - hasn't worked for over 5 yrs so getting rejected for disability even though pt clearly is disabled due to MSK problems.  Recommend trying to contact Legal aid  Diabetic peripheral neuropathy associated with type 2 diabetes mellitus  Type II or unspecified type diabetes mellitus with neurological manifestations, not stated as uncontrolled(250.60)  Chest wall pain, chronic  Left hip pain - Plan: DG Hip Complete Left - increase pain medicne - add in oxycodone  Meds ordered this encounter  Medications  . oxyCODONE (ROXICODONE) 5 MG immediate release tablet    Sig: Take 1-2 tablets (5-10 mg total) by mouth every 4 (four) hours as needed for pain.    Dispense:  180 tablet    Refill:  0  . morphine (MS CONTIN) 30 MG 12 hr tablet    Sig: Take 1 tablet (30 mg total) by mouth 2 (two) times daily. May fill on or after 07/08/13.    Dispense:  60 tablet    Refill:  0    Order Specific Question:  Supervising Provider    Answer:  Claudette Laws E [[id number]]  . oxyCODONE (OXY IR/ROXICODONE) 5 MG immediate release tablet    Sig: Take 1-2 tablets (5-10 mg total) by mouth every 4 (four) hours as  needed for pain. May fill on or after 07/08/13    Dispense:  180 tablet    Refill:  0  . oxyCODONE (OXY IR/ROXICODONE) 5 MG immediate release tablet    Sig: Take 1 tablet (5 mg total) by mouth every 4 (four) hours as needed for pain. May fill on or after 08/06/13    Dispense:  160 tablet    Refill:  0  . oxyCODONE (OXY IR/ROXICODONE) 5 MG immediate  release tablet    Sig: Take 1 tablet (5 mg total) by mouth every 4 (four) hours as needed for pain. May fill on or after 09/05/13    Dispense:  160 tablet    Refill:  0  . morphine (MS CONTIN) 30 MG 12 hr tablet    Sig: Take 1 tablet (30 mg total) by mouth 2 (two) times daily. May fill on or after 08/06/13.    Dispense:  60 tablet    Refill:  0  . morphine (MS CONTIN) 30 MG 12 hr tablet    Sig: Take 1 tablet (30 mg total) by mouth 2 (two) times daily. May fill on or after 09/05/13.    Dispense:  60 tablet    Refill:  0    Norberto Sorenson, MD MPH

## 2013-08-24 ENCOUNTER — Ambulatory Visit (INDEPENDENT_AMBULATORY_CARE_PROVIDER_SITE_OTHER): Payer: Self-pay | Admitting: Emergency Medicine

## 2013-08-24 VITALS — BP 150/82 | HR 74 | Temp 97.9°F | Resp 16 | Ht 71.75 in | Wt 203.0 lb

## 2013-08-24 DIAGNOSIS — M25559 Pain in unspecified hip: Secondary | ICD-10-CM

## 2013-08-24 DIAGNOSIS — G8929 Other chronic pain: Secondary | ICD-10-CM

## 2013-08-24 DIAGNOSIS — M549 Dorsalgia, unspecified: Secondary | ICD-10-CM

## 2013-08-24 MED ORDER — OXYCODONE HCL 5 MG PO TABS: 5.0000 mg | ORAL_TABLET | ORAL | Status: DC | PRN

## 2013-08-24 MED ORDER — MORPHINE SULFATE ER 30 MG PO TBCR: 30.0000 mg | EXTENDED_RELEASE_TABLET | Freq: Two times a day (BID) | ORAL | Status: DC

## 2013-08-24 MED ORDER — TRAMADOL HCL 50 MG PO TABS: 100.0000 mg | ORAL_TABLET | Freq: Four times a day (QID) | ORAL | Status: DC | PRN

## 2013-08-24 NOTE — Progress Notes (Addendum)
Subjective:    Patient ID: Robert Burnett, male    DOB: Jun 14, 1961, 52 y.o.   MRN: [id number]  HPI This chart was scribed for [name], by Ladona Ridgel Quentina Fronek, Scribe. This patient was seen in room 8 and the patient's care was started at 11:09 AM.  HPI Comments: Robert Burnett is a 52 y.o. male who is on disability for past 10 years and has hx of back/hip problems over the past 16 years.  Today he presents to the [facility] complaining of chronic back and left hip pain that has not improved since his last visit here 2 1/2 months ago when he was given a mild pain reliever and told to return if this did not improve his symptoms. He reports previous workup with back and hip X-rays which were noted to have some degenerative changes.   He denies any recent acute injury to his back or hips but states that he initially began having problems in 1998 when he stepped in a hole while hunting and began having back/left hip pain ever since. He was previously being seen at [facility] before they closed. He sometimes uses a walker at home for assistance to ambulate.   Past Medical History  Diagnosis Date  . Hyperlipidemia   . Diabetes mellitus     toe amp 4/12  . Allergy   . Arthritis     Past Surgical History  Procedure Laterality Date  . Knee surgery  1975 and 1989    Family History  Problem Relation Age of Onset  . Hypertension Father   . Diabetes Father   . Cancer Father   . Diabetes Sister   . Cancer Sister   . Cancer Mother     History   Social History  . Marital Status: Single    Spouse Name: N/A    Number of Children: N/A  . Years of Education: N/A   Occupational History  . Not on file.   Social History Main Topics  . Smoking status: Former Smoker -- 2.00 packs/Deano Tomaszewski for 5 years    Types: Cigarettes    Quit date: 10/20/1998  . Smokeless tobacco: Former Neurosurgeon     Comment: used chewing tobacco for about a year at age 53  . Alcohol Use: No  . Drug Use: No  .  Sexual Activity: Not on file   Other Topics Concern  . Not on file   Social History Narrative  . No narrative on file    Allergies  Allergen Reactions  . Prozac [Fluoxetine Hcl]   . Amlodipine Rash    Patient Active Problem List   Diagnosis Date Noted  . Thoracic spondylosis with myelopathy 03/16/2013  . Carpal tunnel syndrome 03/16/2013  . Chest wall pain, chronic 03/16/2013  . Type II or unspecified type diabetes mellitus with neurological manifestations, not stated as uncontrolled(250.60) 03/16/2013  . Debility 03/16/2013  . Chronic pain syndrome 03/16/2013  . Lumbar degenerative disc disease 12/12/2011  . Costochondritis 12/12/2011  . Diabetic peripheral neuropathy associated with type 2 diabetes mellitus 12/12/2011    Results for orders placed in visit on 03/16/13  BASIC METABOLIC PANEL      Result Value Range   Sodium 137  135 - 145 mEq/L   Potassium 4.7  3.5 - 5.3 mEq/L   Chloride 102  96 - 112 mEq/L   CO2 27  19 - 32 mEq/L   Glucose, Bld 129 (*) 70 - 99 mg/dL   BUN 21  6 - 23 mg/dL   Creat 1.61  0.96 - 0.45 mg/dL   Calcium 9.5  8.4 - 40.9 mg/dL  POCT GLYCOSYLATED HEMOGLOBIN (HGB A1C)      Result Value Range   Hemoglobin A1C 6.2      No diagnosis found.  No orders of the defined types were placed in this encounter.     Review of Systems  Musculoskeletal: Positive for back pain (and left hip pain).   Triage Vitals: BP 150/82  Pulse 74  Temp(Src) 97.9 F (36.6 C) (Oral)  Resp 16  Ht 5' 11.75" (1.822 m)  Wt 203 lb (92.08 kg)  BMI 27.74 kg/m2  SpO2 99%    Objective:   Physical Exam  Nursing note and vitals reviewed. Constitutional: He is oriented to person, place, and time. He appears well-developed and well-nourished. No distress.  HENT:  Head: Normocephalic and atraumatic.  Neck: Neck supple. No tracheal deviation present.  Cardiovascular: Normal rate.   Pulmonary/Chest: Effort normal. No respiratory distress.  Musculoskeletal: Normal range  of motion.  Neurological: He is alert and oriented to person, place, and time.  Skin: Skin is warm and dry.  Psychiatric: He has a normal mood and affect. His behavior is normal.   He has good range of motion of both hips. His deep tendon reflexes are 2+ on the right 1+ on the left there is a large scar over the medial portion of the right knee       Assessment & Plan:  I offered to give the patient a one-month prescription of his pain medications. I told him I could not go up on the dose. The patient states he would prefer to see Dr. Clelia Croft tomorrow and discuss the situation with her. I told him I would not charge him for his visit today. he will see Dr. Clelia Croft tomorrow. No prescriptions were written today.

## 2013-08-25 ENCOUNTER — Ambulatory Visit: Payer: Self-pay | Admitting: Family Medicine

## 2013-08-25 VITALS — BP 130/80 | HR 56 | Temp 99.2°F | Resp 16 | Ht 71.8 in | Wt 206.0 lb

## 2013-08-25 DIAGNOSIS — G8929 Other chronic pain: Secondary | ICD-10-CM

## 2013-08-25 DIAGNOSIS — E1149 Type 2 diabetes mellitus with other diabetic neurological complication: Secondary | ICD-10-CM

## 2013-08-25 DIAGNOSIS — G894 Chronic pain syndrome: Secondary | ICD-10-CM

## 2013-08-25 DIAGNOSIS — R071 Chest pain on breathing: Secondary | ICD-10-CM

## 2013-08-25 DIAGNOSIS — E1142 Type 2 diabetes mellitus with diabetic polyneuropathy: Secondary | ICD-10-CM

## 2013-08-25 LAB — POCT GLYCOSYLATED HEMOGLOBIN (HGB A1C): Hemoglobin A1C: 6

## 2013-08-25 MED ORDER — OXYCODONE HCL 10 MG PO TABS: 10.0000 mg | ORAL_TABLET | Freq: Three times a day (TID) | ORAL | Status: DC | PRN

## 2013-08-25 MED ORDER — CARISOPRODOL 350 MG PO TABS: 350.0000 mg | ORAL_TABLET | Freq: Four times a day (QID) | ORAL | Status: DC | PRN

## 2013-08-25 MED ORDER — FENTANYL 25 MCG/HR TD PT72: 25.0000 ug | MEDICATED_PATCH | TRANSDERMAL | Status: DC

## 2013-08-25 NOTE — Progress Notes (Signed)
Subjective:    Patient ID: Robert Burnett, male    DOB: 02/17/1961, 52 y.o.   MRN: [id number] Chief Complaint  Patient presents with  . Follow-up    trunk pain and hip pain  . Leg Swelling    right    HPI  Has not worked for 10 years due to his medical problems - mainly joint, back, chest pain. Inherited everything from his father so now doesn't qualify for social security benefits but certainly doesn't have any income.  First 2 rxs were 180, then 3rd and 4th rxs were for 160 oxycodone 5.  Even taking 2 tabs every 4 hours provides a little relief but not significant.  When the weather is bad or if he has to set for a long time he gets rib and back pain - feels like chest is going to explode or someone is beating him w/ a baseball bat.  Feels like an elephants is sitting on his spine.   Can we readjusts  He can't take care of his house due to his pain but can't afford laborers.  Rolled his right ankle in inversion-type injury sev wks ago, since then has had intermittent swelling of both legs to knees but Rt >Lt. Past Medical History  Diagnosis Date  . Hyperlipidemia   . Diabetes mellitus     toe amp 4/12  . Allergy   . Arthritis    Current Outpatient Prescriptions on File Prior to Visit  Medication Sig Dispense Refill  . aspirin 81 MG tablet Take 81 mg by mouth daily.      Marland Kitchen atenolol (TENORMIN) 100 MG tablet Take 1 tablet (100 mg total) by mouth daily.  90 tablet  3  . carisoprodol (SOMA) 350 MG tablet Take 1 tablet (350 mg total) by mouth 4 (four) times daily as needed for muscle spasms.  120 tablet  5  . fluticasone (FLONASE) 50 MCG/ACT nasal spray Place 2 sprays into the nose daily.      Marland Kitchen loratadine (CLARITIN) 10 MG tablet Take 10 mg by mouth daily as needed.      Marland Kitchen losartan (COZAAR) 100 MG tablet Take 1 tablet (100 mg total) by mouth daily.  90 tablet  3  . metFORMIN (GLUCOPHAGE) 1000 MG tablet Take 1 tablet (1,000 mg total) by mouth 2 (two) times daily with a meal.  180  tablet  3  . morphine (MS CONTIN) 30 MG 12 hr tablet Take 1 tablet (30 mg total) by mouth 2 (two) times daily. May fill on or after 07/08/13.  60 tablet  0  . morphine (MS CONTIN) 30 MG 12 hr tablet Take 1 tablet (30 mg total) by mouth 2 (two) times daily. May fill on or after 08/06/13.  60 tablet  0  . morphine (MS CONTIN) 30 MG 12 hr tablet Take 1 tablet (30 mg total) by mouth 2 (two) times daily. May fill on or after 09/05/13.  60 tablet  0  . Multiple Vitamin (MULTIVITAMIN) tablet Take 1 tablet by mouth daily.      Marland Kitchen oxyCODONE (OXY IR/ROXICODONE) 5 MG immediate release tablet Take 1-2 tablets (5-10 mg total) by mouth every 4 (four) hours as needed for pain. May fill on or after 07/08/13  180 tablet  0  . oxyCODONE (OXY IR/ROXICODONE) 5 MG immediate release tablet Take 1 tablet (5 mg total) by mouth every 4 (four) hours as needed for pain. May fill on or after 08/06/13  160 tablet  0  .  oxyCODONE (OXY IR/ROXICODONE) 5 MG immediate release tablet Take 1 tablet (5 mg total) by mouth every 4 (four) hours as needed. May fill on or after 09/05/13  160 tablet  0  . oxyCODONE (ROXICODONE) 5 MG immediate release tablet Take 1-2 tablets (5-10 mg total) by mouth every 4 (four) hours as needed for pain.  180 tablet  0  . pantoprazole (PROTONIX) 40 MG tablet Take 1 tablet (40 mg total) by mouth daily.  30 tablet  5  . traMADol (ULTRAM) 50 MG tablet Take 2 tablets (100 mg total) by mouth every 6 (six) hours as needed.  240 tablet  5   No current facility-administered medications on file prior to visit.   Allergies  Allergen Reactions  . Prozac [Fluoxetine Hcl]   . Amlodipine Rash      Review of Systems  Constitutional: Positive for fatigue. Negative for fever, chills and activity change.  Cardiovascular: Positive for chest pain and leg swelling.  Gastrointestinal: Negative for abdominal pain, diarrhea and constipation.  Genitourinary: Negative for urgency, frequency, decreased urine volume and  difficulty urinating.  Musculoskeletal: Positive for arthralgias, back pain, gait problem, joint swelling and myalgias.  Skin: Negative for color change and wound.  Neurological: Negative for dizziness, weakness, light-headedness and numbness.  Psychiatric/Behavioral: Positive for sleep disturbance.      BP 130/80  Pulse 56  Temp(Src) 99.2 F (37.3 C) (Oral)  Resp 16  Ht 5' 11.8" (1.824 m)  Wt 206 lb (93.441 kg)  BMI 28.09 kg/m2  SpO2 100% Objective:   Physical Exam  Constitutional: He is oriented to person, place, and time. He appears well-developed and well-nourished. No distress.  HENT:  Head: Normocephalic and atraumatic.  Eyes: No scleral icterus.  Pulmonary/Chest: Effort normal.  Musculoskeletal: He exhibits tenderness. He exhibits no edema.  Neurological: He is alert and oriented to person, place, and time. Gait abnormal.  Skin: Skin is warm and dry. He is not diaphoretic.  Psychiatric: He has a normal mood and affect. His behavior is normal.      Results for orders placed in visit on 08/25/13  POCT GLYCOSYLATED HEMOGLOBIN (HGB A1C)      Result Value Range   Hemoglobin A1C 6.0      Assessment & Plan:  Chronic pain syndrome - Plan: POCT SEDIMENTATION RATE - pt has never had evaluation for rheumatic or auto-immune type causes so will check sed rate today. I think he needs this w/u due to the severity of his pain but I am concerned it will be expensive for him and of low-yield.  Will start off w/ ESR and if sig high will have more urgency in having a rheumatic eval or trial of prednisone.  Rec pt est w/ [facility] where this w/u could be done in a more cost-efficient manner. However, pt informed that they do NOT do pain management so if he is able to receive care there - mainly for his DM and HPL - he will need to continue to see me for pain management. He got minimal relief from the change from hydrocodone to oxycodone - even at 30mg  daily of oxy IR provides only minimal  relief. Therefore, will try switching long-acting MS Contin to a fentanyl patch - start on 25mg  and pt will call in at the end of this month to let me know how he is doing on his new regimen.  Chest wall pain, chronic  Diabetic peripheral neuropathy associated with type 2 diabetes mellitus - a1c continues  to be under excellent control - cont on current regimen.  Type II or unspecified type diabetes mellitus with neurological manifestations, not stated as uncontrolled(250.60) - Plan: POCT glycosylated hemoglobin (Hb A1C)  HPL - in the past was crestor, lovaza, and tricor as LDL was 180s but caused elevated LFTs - from normal ALT bumped to 111 and AST bumped to 92 on tricor, mildly elevated to 68, 80 respecitvely on crestor. Normal LFTs when not on any medication.  He needs his lipid levels checked and will likely need to be on a statin but will need freq LFTs monitoring with this.  Records scanned in  Meds ordered this encounter  Medications  . fentaNYL (DURAGESIC) 25 MCG/HR patch    Sig: Place 1 patch (25 mcg total) onto the skin every 3 (three) days.    Dispense:  10 patch    Refill:  0  . Oxycodone HCl 10 MG TABS    Sig: Take 1 tablet (10 mg total) by mouth every 8 (eight) hours as needed.    Dispense:  90 tablet    Refill:  0  . carisoprodol (SOMA) 350 MG tablet    Sig: Take 1 tablet (350 mg total) by mouth 4 (four) times daily as needed for muscle spasms.    Dispense:  120 tablet    Refill:  5   Norberto Sorenson, MD MPH

## 2013-08-25 NOTE — Patient Instructions (Signed)
We need to get your diabetes and cholesterol monitored along with your liver and kidneys. In the past, we had to take you off of cholesterol medication due to liver irritation.  Please call [facility] to see if we could get your labs and medications done there.  They do not do pain management so I will keep on doing this for you.  [facility] [address] [place], [place] [address] Hours of Operation Mon - Fri: 9 a.m. - 6 p.m. Main: [phone]

## 2013-09-12 ENCOUNTER — Telehealth: Payer: Self-pay | Admitting: Radiology

## 2013-09-12 NOTE — Telephone Encounter (Signed)
Spoke to pharmacy, do you want patient to continue tramadol? He is currently on Oxycodone and Fentanyl. Please advise.

## 2013-09-14 NOTE — Telephone Encounter (Signed)
Lets try to hold off on the tramadol for now since we recently changed/increased his narcotics and we really need to minimize the # of meds to reduce side effects and interactions.  If he feels that his pain sig worsens off of the tramadol and that this was helping him significantly, please call back after trial off.  Thanks.

## 2013-09-16 NOTE — Telephone Encounter (Signed)
Thanks have cancelled the Tramadol.

## 2013-09-19 ENCOUNTER — Telehealth: Payer: Self-pay

## 2013-09-19 NOTE — Telephone Encounter (Signed)
PT STATES DR Clelia Croft GAVE HIM A DIFFERENT KIND OF MEDICINE TO TRY AND CALL HER BACK. PLEASE CALL [phone]

## 2013-09-19 NOTE — Telephone Encounter (Signed)
Called him and he indicates new meds helping. He wants refill please advise.

## 2013-09-20 MED ORDER — OXYCODONE HCL 10 MG PO TABS: 10.0000 mg | ORAL_TABLET | Freq: Three times a day (TID) | ORAL | Status: DC | PRN

## 2013-09-20 MED ORDER — FENTANYL 25 MCG/HR TD PT72: 25.0000 ug | MEDICATED_PATCH | TRANSDERMAL | Status: DC

## 2013-09-20 NOTE — Telephone Encounter (Signed)
Great - I'm glad that they are working a little better for him. Rx printed for Dec and Jan. Pt should call in Jan to let me know how he is still doing and so I can provide him refills for Feb/March/April at that point since I will be out on maternity leave. Then plan to f/u for OV in May.

## 2013-10-01 ENCOUNTER — Telehealth: Payer: Self-pay

## 2013-10-01 NOTE — Telephone Encounter (Signed)
Patient calling to see if his medication is ready for pick up. Informed patient that it is ready and waiting at the front desk. Patient states did not receive a call back about his previous phone message. Informed patient of Dr. Alver Fisher message from 09-20-2013. Patient understood and agreed.   [phone]

## 2013-11-23 ENCOUNTER — Telehealth: Payer: Self-pay

## 2013-11-23 NOTE — Telephone Encounter (Signed)
Yes, please provide refills for pt of oxycodone and fentanyl patch for Feb, March, and April on my behalf.  Thank you.

## 2013-11-23 NOTE — Telephone Encounter (Signed)
Pt is calling for a refill of oxycodone and his pain patch. Please call [phone]

## 2013-11-24 MED ORDER — OXYCODONE HCL 10 MG PO TABS: 10.0000 mg | ORAL_TABLET | Freq: Three times a day (TID) | ORAL | Status: DC | PRN

## 2013-11-24 MED ORDER — FENTANYL 25 MCG/HR TD PT72: 25.0000 ug | MEDICATED_PATCH | TRANSDERMAL | Status: DC

## 2013-11-24 NOTE — Telephone Encounter (Signed)
Rx's printed on Dr. Raul Del behalf for February (plan to print for March and April as the patient needs them).   Meds ordered this encounter  Medications  . fentaNYL (DURAGESIC) 25 MCG/HR patch    Sig: Place 1 patch (25 mcg total) onto the skin every 3 (three) days.    Dispense:  10 patch    Refill:  0    Order Specific Question:  Supervising Provider    Answer:  DOOLITTLE, ROBERT P [[id number]]  . Oxycodone HCl 10 MG TABS    Sig: Take 1 tablet (10 mg total) by mouth every 8 (eight) hours as needed.    Dispense:  90 tablet    Refill:  0    Order Specific Question:  Supervising Provider    Answer:  DOOLITTLE, ROBERT P [[id number]]

## 2013-11-25 NOTE — Telephone Encounter (Signed)
Notified pt ready. 

## 2013-12-27 ENCOUNTER — Telehealth: Payer: Self-pay

## 2013-12-27 NOTE — Telephone Encounter (Signed)
Pt needs refill on Fentanyl patches and Oxycodone HCL. I have pended medications.

## 2013-12-27 NOTE — Telephone Encounter (Signed)
Patient is needing to get his pain medicine refilled while Dr [name] is out on maternity leave.  Please call patient and let him know either way.  Thanks!    Best#: [phone]  ( ok to leave message )

## 2013-12-30 MED ORDER — OXYCODONE HCL 10 MG PO TABS: 10.0000 mg | ORAL_TABLET | Freq: Three times a day (TID) | ORAL | Status: DC | PRN

## 2013-12-30 MED ORDER — FENTANYL 25 MCG/HR TD PT72: 25.0000 ug | MEDICATED_PATCH | TRANSDERMAL | Status: DC

## 2013-12-30 NOTE — Telephone Encounter (Signed)
Notified pt Rxs are ready for p/up.

## 2013-12-30 NOTE — Telephone Encounter (Signed)
Meds ordered this encounter  Medications  . fentaNYL (DURAGESIC) 25 MCG/HR patch    Sig: Place 1 patch (25 mcg total) onto the skin every 3 (three) days.    Dispense:  10 patch    Refill:  0    Order Specific Question:  Supervising Provider    Answer:  DOOLITTLE, ROBERT P [[id number]]  . Oxycodone HCl 10 MG TABS    Sig: Take 1 tablet (10 mg total) by mouth every 8 (eight) hours as needed.    Dispense:  90 tablet    Refill:  0    Order Specific Question:  Supervising Provider    Answer:  DOOLITTLE, ROBERT P [[id number]]

## 2014-01-27 ENCOUNTER — Telehealth: Payer: Self-pay

## 2014-01-27 NOTE — Telephone Encounter (Signed)
Patient needing a refill on his medicines: Vintal Patches and Oxyocodone   Best#: [phone]  (ok to leave message)

## 2014-01-30 MED ORDER — OXYCODONE HCL 10 MG PO TABS: 10.0000 mg | ORAL_TABLET | Freq: Three times a day (TID) | ORAL | Status: DC | PRN

## 2014-01-30 MED ORDER — FENTANYL 25 MCG/HR TD PT72: 25.0000 ug | MEDICATED_PATCH | TRANSDERMAL | Status: DC

## 2014-01-30 NOTE — Telephone Encounter (Signed)
Rxs printed and signed.  Pt needs apt with Dr. [name] in May

## 2014-01-30 NOTE — Telephone Encounter (Signed)
Notified pt ready and discussed f/up.

## 2014-03-02 ENCOUNTER — Ambulatory Visit: Payer: Self-pay | Admitting: Family Medicine

## 2014-03-02 VITALS — BP 138/100 | HR 64 | Temp 98.6°F | Resp 16 | Ht 71.0 in | Wt 201.8 lb

## 2014-03-02 DIAGNOSIS — G894 Chronic pain syndrome: Secondary | ICD-10-CM

## 2014-03-02 LAB — POCT SEDIMENTATION RATE: POCT SED RATE: 15 mm/hr (ref 0–22)

## 2014-03-02 MED ORDER — OXYCODONE HCL 10 MG PO TABS: 10.0000 mg | ORAL_TABLET | Freq: Three times a day (TID) | ORAL | Status: DC | PRN

## 2014-03-02 MED ORDER — METFORMIN HCL 1000 MG PO TABS: 1000.0000 mg | ORAL_TABLET | Freq: Two times a day (BID) | ORAL | Status: DC

## 2014-03-02 MED ORDER — FENTANYL 50 MCG/HR TD PT72: 50.0000 ug | MEDICATED_PATCH | TRANSDERMAL | Status: DC

## 2014-03-02 MED ORDER — LOSARTAN POTASSIUM 100 MG PO TABS: 100.0000 mg | ORAL_TABLET | Freq: Every day | ORAL | Status: DC

## 2014-03-02 MED ORDER — ATENOLOL 100 MG PO TABS: 100.0000 mg | ORAL_TABLET | Freq: Every day | ORAL | Status: DC

## 2014-03-02 MED ORDER — PANTOPRAZOLE SODIUM 40 MG PO TBEC
40.0000 mg | DELAYED_RELEASE_TABLET | Freq: Every day | ORAL | Status: DC
Start: 2014-03-02 — End: 2015-02-24

## 2014-03-02 MED ORDER — CARISOPRODOL 350 MG PO TABS: 350.0000 mg | ORAL_TABLET | Freq: Four times a day (QID) | ORAL | Status: DC | PRN

## 2014-03-02 NOTE — Progress Notes (Signed)
This chart was scribed for [name], MD by Marcha Dutton, ED Scribe. This patient was seen in room 1 and the patient's care was started at 8:38 AM.  Subjective:    Patient ID: Robert Burnett, male    DOB: [birth year], 53 y.o.   MRN: [id number]   Chief Complaint  Patient presents with  . Pain    All over anterior torso  . Medication Refill    Oxycodone, fentanyl patch, protonix    HPI Mr. Dittmar is a longstanding pt of mine with chronic thoracic spine and chest wall pain. Pt had been seen briefly by Dr. Letta Pate but due to lack of health insurance had to discontinue his visits to pain management. Pt's pain control is poor and did not improve when I switched him from hydrocodone to oxycodone. Even at 30 mg daily of oxy IR, he only experienced minimal relief (which was in addition to MS contin); therefore, 6 mos previously we transitioned him to a regimen of fentanyl patch with PRN oxycodone and d/c his tramadol with improvement in his chronic pain. Pt reports the fentanyl patch is working much better than his previous regimen to manage pain. He states he would like to go up on the strength of the patch. Pt denies dizziness, weakness, or nausea. Pt states he had a rough winter. He states his back pain has been worsening and he had to use a walker for most of the winter. He reports using wood instead of propane and the effort associated placed physical stress on his bad hip and back. He notes seasonal changes to his pain regularly. He notes that rainy and cool weather make his chronic joint pain so painful he cries.   Mr. Strader also has DM well controlled on metformin. Pt's last HGBA1C was 6.0 at 08/25/2013. Pt reports he does check his BP outside the office but less than twice a week. Pt does check his sugars at home and states at last check it was a little over 120. He reports he usually takes his sugars a little later in the day.   Pt reports he forgot to register with the [facility]. Pt states he took care of his father as he died of cancer and now he lives alone. He states it was difficult to watch his father pass. He notes too his mother and sister have passed as well. He denies any depression or overwhelming sadness about being the last of his family still living. He states the chronic pain in combination with loneliness is difficult but with the new pain medication regimen he is more able to get out of bed and take care of himself which provides some comfort.     Patient Active Problem List   Diagnosis Date Noted  . Thoracic spondylosis with myelopathy 03/16/2013  . Carpal tunnel syndrome 03/16/2013  . Chest wall pain, chronic 03/16/2013  . Type II or unspecified type diabetes mellitus with neurological manifestations, not stated as uncontrolled(250.60) 03/16/2013  . Debility 03/16/2013  . Chronic pain syndrome 03/16/2013  . Lumbar degenerative disc disease 12/12/2011  . Costochondritis 12/12/2011  . Diabetic peripheral neuropathy associated with type 2 diabetes mellitus 12/12/2011    Past Medical History  Diagnosis Date  . Hyperlipidemia   . Diabetes mellitus     toe amp 4/12  . Allergy   . Arthritis     Past Surgical History  Procedure Laterality Date  . Knee surgery  1975 and 1989  Allergies  Allergen Reactions  . Prozac [Fluoxetine Hcl]   . Amlodipine Rash    Prior to Admission medications   Medication Sig Start Date End Date Taking? Authorizing Provider  aspirin 81 MG tablet Take 81 mg by mouth daily.   Yes Historical Provider, MD  atenolol (TENORMIN) 100 MG tablet Take 1 tablet (100 mg total) by mouth daily. 03/16/13  Yes [name], MD  carisoprodol (SOMA) 350 MG tablet Take 1 tablet (350 mg total) by mouth 4 (four) times daily as needed for muscle spasms. 08/25/13  Yes [name], MD  fentaNYL (DURAGESIC) 25 MCG/HR patch Place 1 patch (25 mcg total) onto the skin every 3 (three) days. 01/30/14  Yes Eleanore E Egan, PA-C    fluticasone (FLONASE) 50 MCG/ACT nasal spray Place 2 sprays into the nose daily.   Yes Historical Provider, MD  loratadine (CLARITIN) 10 MG tablet Take 10 mg by mouth daily as needed.   Yes Historical Provider, MD  losartan (COZAAR) 100 MG tablet Take 1 tablet (100 mg total) by mouth daily. 03/16/13  Yes [name], MD  metFORMIN (GLUCOPHAGE) 1000 MG tablet Take 1 tablet (1,000 mg total) by mouth 2 (two) times daily with a meal. 03/16/13  Yes [name], MD  Multiple Vitamin (MULTIVITAMIN) tablet Take 1 tablet by mouth daily.   Yes Historical Provider, MD  Oxycodone HCl 10 MG TABS Take 1 tablet (10 mg total) by mouth every 8 (eight) hours as needed. 01/30/14  Yes Eleanore E Elana Alm, PA-C  pantoprazole (PROTONIX) 40 MG tablet Take 1 tablet (40 mg total) by mouth daily. 03/16/13  Yes [name], MD    History   Social History  . Marital Status: Single    Spouse Name: N/A    Number of Children: N/A  . Years of Education: N/A   Occupational History  . Not on file.   Social History Main Topics  . Smoking status: Former Smoker -- 2.00 packs/day for 5 years    Types: Cigarettes    Quit date: 10/20/1998  . Smokeless tobacco: Former Systems developer     Comment: used chewing tobacco for about a year at age 48  . Alcohol Use: No  . Drug Use: No  . Sexual Activity: Not on file   Other Topics Concern  . Not on file   Social History Narrative  . No narrative on file     Review of Systems  Constitutional: Positive for activity change and fatigue. Negative for fever, chills, appetite change and unexpected weight change.  HENT: Negative for congestion, ear pain, rhinorrhea and sore throat.   Eyes: Negative for pain.  Respiratory: Positive for chest tightness. Negative for apnea, cough, shortness of breath and wheezing.   Cardiovascular: Positive for chest pain. Negative for leg swelling.  Gastrointestinal: Negative for nausea, vomiting, abdominal pain and diarrhea.  Genitourinary: Negative for dysuria  and hematuria.  Musculoskeletal: Positive for arthralgias, back pain, gait problem and myalgias. Negative for joint swelling.  Skin: Negative for rash.  Neurological: Positive for weakness. Negative for dizziness, numbness and headaches.  Psychiatric/Behavioral: Positive for dysphoric mood. The patient is not nervous/anxious.        Objective:   Physical Exam  Nursing note and vitals reviewed. Constitutional: He is oriented to person, place, and time. He appears well-developed and well-nourished. No distress.  HENT:  Head: Normocephalic and atraumatic.  Eyes: Conjunctivae and EOM are normal. Pupils are equal, round, and reactive to light. Right eye  exhibits no discharge. Left eye exhibits no discharge. No scleral icterus.  Neck: Normal range of motion. Neck supple. No mass and no thyromegaly present.  Cardiovascular: Normal rate, regular rhythm, S1 normal and normal heart sounds.  Exam reveals no gallop and no friction rub.   No murmur heard. Pulmonary/Chest: Effort normal and breath sounds normal. No stridor. No respiratory distress. He has no wheezes. He has no rales. He exhibits no tenderness.  Abdominal: Soft. Bowel sounds are normal. He exhibits no distension. There is no tenderness. There is no rebound.  Musculoskeletal: Normal range of motion. He exhibits no edema and no tenderness.  Lymphadenopathy:    He has no cervical adenopathy.  Neurological: He is alert and oriented to person, place, and time. He exhibits normal muscle tone. Coordination normal.  Skin: Skin is warm and dry. No rash noted. He is not diaphoretic. No erythema.  Psychiatric: He has a normal mood and affect. His behavior is normal.    Triage Vitals: BP 138/100  Pulse 64  Temp(Src) 98.6 F (37 C) (Oral)  Resp 16  Ht 5\' 11"  (1.803 m)  Wt 201 lb 12.8 oz (91.536 kg)  BMI 28.16 kg/m2  SpO2 100%      Assessment & Plan:    COORDINATION OF CARE:  8:57 AM - Directed Mr. Trickett to call in 3 months for  refills. If seen at [facility] and [facility], I will check in over the phone and adjust or refill his medication. If pt is unable to receive any lab work, I would request he return for a BMP and HGBA1C. Pt advised of plan for treatment and pt agrees. Chronic pain syndrome - Plan: POCT SEDIMENTATION RATE I would like pt to get complete labs to r/o rheumatologic causes at [facility] - hopefully if he is seen there, they can order crp, ana, rf, anti-ccp to screen for other potential causes of his pain and to see if he might benefit from a rheumatology c/s. Meds ordered this encounter  Medications  . DISCONTD: fentaNYL (DURAGESIC) 50 MCG/HR    Sig: Place 1 patch (50 mcg total) onto the skin every 3 (three) days.    Dispense:  10 patch    Refill:  0    Order Specific Question:  Supervising Provider    Answer:  DOOLITTLE, ROBERT P [[id number]]  . DISCONTD: Oxycodone HCl 10 MG TABS    Sig: Take 1 tablet (10 mg total) by mouth every 8 (eight) hours as needed.    Dispense:  90 tablet    Refill:  0    Order Specific Question:  Supervising Provider    Answer:  DOOLITTLE, ROBERT P [[id number]]  . pantoprazole (PROTONIX) 40 MG tablet    Sig: Take 1 tablet (40 mg total) by mouth daily.    Dispense:  30 tablet    Refill:  5  . metFORMIN (GLUCOPHAGE) 1000 MG tablet    Sig: Take 1 tablet (1,000 mg total) by mouth 2 (two) times daily with a meal.    Dispense:  180 tablet    Refill:  3  . losartan (COZAAR) 100 MG tablet    Sig: Take 1 tablet (100 mg total) by mouth daily.    Dispense:  90 tablet    Refill:  3  . carisoprodol (SOMA) 350 MG tablet    Sig: Take 1 tablet (350 mg total) by mouth 4 (four) times daily as needed for muscle spasms.    Dispense:  120 tablet  Refill:  5  . atenolol (TENORMIN) 100 MG tablet    Sig: Take 1 tablet (100 mg total) by mouth daily.    Dispense:  90 tablet    Refill:  3  . DISCONTD: fentaNYL (DURAGESIC - DOSED MCG/HR) 50 MCG/HR    Sig: Place 1 patch (50 mcg total) onto the  skin every 3 (three) days. May fill on or after 04/01/14.    Dispense:  10 patch    Refill:  0    Order Specific Question:  Supervising Provider    Answer:  DOOLITTLE, ROBERT P [id number]  . DISCONTD: Oxycodone HCl 10 MG TABS    Sig: Take 1 tablet (10 mg total) by mouth every 8 (eight) hours as needed. May fill on or after 04/01/14.    Dispense:  90 tablet    Refill:  0    Order Specific Question:  Supervising Provider    Answer:  DOOLITTLE, ROBERT P [id number]  . fentaNYL (DURAGESIC - DOSED MCG/HR) 50 MCG/HR    Sig: Place 1 patch (50 mcg total) onto the skin every 3 (three) days. May fill on or after 04/30/14.    Dispense:  10 patch    Refill:  0    Order Specific Question:  Supervising Provider    Answer:  DOOLITTLE, ROBERT P [id number]  . Oxycodone HCl 10 MG TABS    Sig: Take 1 tablet (10 mg total) by mouth every 8 (eight) hours as needed. May fill on or after 04/30/14.    Dispense:  90 tablet    Refill:  0    Order Specific Question:  Supervising Provider    Answer:  DOOLITTLE, ROBERT P [id number]    I personally performed the services described in this documentation, which was scribed in my presence. The recorded information has been reviewed and considered, and addended by me as needed.  [name], MD MPH

## 2014-03-02 NOTE — Patient Instructions (Signed)
Please call the clinic below to get an appointment asap.  They are a clinic set up by [facility] to care for people without health insurance so may be able to get you care at much cheaper cost with a large discount on labs, imaging, and medication. [place], [place] [address] Hours of Operation Mon - Fri: 9 a.m. - 6 p.m. Main: [phone] The orange card program is sponsored by the [facility]. Call [phone] to see if this might be something for which you are eligible. This can ensure you have access to the medical and dental services that [facility] and [facility] help provide to people without health insurance.

## 2014-05-30 ENCOUNTER — Other Ambulatory Visit: Payer: Self-pay | Admitting: Family Medicine

## 2014-05-30 ENCOUNTER — Telehealth: Payer: Self-pay

## 2014-05-30 MED ORDER — OXYCODONE HCL 10 MG PO TABS: 10.0000 mg | ORAL_TABLET | Freq: Three times a day (TID) | ORAL | Status: DC | PRN

## 2014-05-30 MED ORDER — FENTANYL 75 MCG/HR TD PT72: 75.0000 ug | MEDICATED_PATCH | TRANSDERMAL | Status: DC

## 2014-05-30 MED ORDER — OXYCODONE HCL 10 MG PO TABS
10.0000 mg | ORAL_TABLET | Freq: Three times a day (TID) | ORAL | Status: DC | PRN
Start: 2014-05-30 — End: 2014-05-30

## 2014-05-30 NOTE — Telephone Encounter (Signed)
PT STATES HE IS ABOUT OUT OF HIS OXYCODONE HCI 10MG S AND HIS OTHER PAIN MEDICINE , WAS TOLD DR [name] WANTED HIM TO CALL AND SEE IF THE MEDS NEED ADJUSTING PLEASE CALL [phone]

## 2014-05-30 NOTE — Telephone Encounter (Signed)
3 mos of prescriptions printed for pt to pick up.  Im glad the patch is working.  Ok to try to go up to 75 mcg patch from the 31mcg but need to be careful in the heat - heat can increase the amount of pain medicine he is absorbing and at such high doses we need to be very cautious as accidental overdose common.  I will see pt back in clinic in 3 mos - has he been able to get an appt at [facility] for labs? ES

## 2014-05-30 NOTE — Telephone Encounter (Signed)
Spoke to pt, med working pretty good, pain still intense, but have only gone up once, and that helps, but he has had this for many years.  Patches are better than anything he has tried,  It still has a ways to go.  Please advise

## 2014-05-30 NOTE — Telephone Encounter (Signed)
Thursday 06/01/2014. he will go for labs.   Spoke to pt, he is aware.

## 2014-06-01 ENCOUNTER — Encounter: Payer: Self-pay | Admitting: Internal Medicine

## 2014-06-01 ENCOUNTER — Ambulatory Visit: Payer: Self-pay | Attending: Internal Medicine | Admitting: Internal Medicine

## 2014-06-01 VITALS — BP 150/77 | HR 59 | Temp 98.6°F | Resp 14 | Ht 73.0 in | Wt 210.0 lb

## 2014-06-01 DIAGNOSIS — Z87891 Personal history of nicotine dependence: Secondary | ICD-10-CM | POA: Insufficient documentation

## 2014-06-01 DIAGNOSIS — G8929 Other chronic pain: Secondary | ICD-10-CM | POA: Insufficient documentation

## 2014-06-01 DIAGNOSIS — I10 Essential (primary) hypertension: Secondary | ICD-10-CM | POA: Insufficient documentation

## 2014-06-01 DIAGNOSIS — M546 Pain in thoracic spine: Secondary | ICD-10-CM | POA: Insufficient documentation

## 2014-06-01 DIAGNOSIS — E1149 Type 2 diabetes mellitus with other diabetic neurological complication: Secondary | ICD-10-CM | POA: Insufficient documentation

## 2014-06-01 DIAGNOSIS — M2550 Pain in unspecified joint: Secondary | ICD-10-CM | POA: Insufficient documentation

## 2014-06-01 DIAGNOSIS — S98139A Complete traumatic amputation of one unspecified lesser toe, initial encounter: Secondary | ICD-10-CM | POA: Insufficient documentation

## 2014-06-01 DIAGNOSIS — Z7982 Long term (current) use of aspirin: Secondary | ICD-10-CM | POA: Insufficient documentation

## 2014-06-01 DIAGNOSIS — Z79899 Other long term (current) drug therapy: Secondary | ICD-10-CM | POA: Insufficient documentation

## 2014-06-01 LAB — POCT CBG (FASTING - GLUCOSE)-MANUAL ENTRY: Glucose Fasting, POC: 117 mg/dL — AB (ref 70–99)

## 2014-06-01 LAB — POCT GLYCOSYLATED HEMOGLOBIN (HGB A1C): Hemoglobin A1C: 6.1

## 2014-06-01 NOTE — Progress Notes (Signed)
Patient presents for lab work from PCP  C/O chronic back pain from stepping in shallow hole 17 years ago Pain radiates to clavicles, sternum and all ribs Uses fentanyl patches and oxycodone to control pain

## 2014-06-01 NOTE — Progress Notes (Signed)
Patient ID: Robert Burnett, male   DOB: July 10, 1961, 53 y.o.   MRN: [id number]  UKG:[id number]  [id number]  DOB - 1960-12-07  CC:  Chief Complaint  Patient presents with  . Establish Care  . Back Pain  . Arthritis       HPI: Robert Burnett is a 53 y.o. male here today to establish medical care.  Patient reports that he is a patient of Dr. [name] at [facility].  He states that she gives him Oxycodone and Fentanyl patches for his chronic pain.  He states that his pain is in his chest, ribs, and thoracic spine.  He reports that he has been told that he has severe arthritis.  He states that he does not plan to leave Dr. [name] care but she sent him here to get blood work.  He is unsure exactly what labs he needs.  He states that she was concerned about rheumatoid arthritis.  He has not been on any antidiabetic drugs in a long time, but states that he has been controlling it with his diet.   Allergies  Allergen Reactions  . Prozac [Fluoxetine Hcl]   . Amlodipine Rash   Past Medical History  Diagnosis Date  . Hyperlipidemia   . Diabetes mellitus     toe amp 4/12  . Allergy   . Arthritis   . Hypertension    Current Outpatient Prescriptions on File Prior to Visit  Medication Sig Dispense Refill  . atenolol (TENORMIN) 100 MG tablet Take 1 tablet (100 mg total) by mouth daily.  90 tablet  3  . carisoprodol (SOMA) 350 MG tablet Take 1 tablet (350 mg total) by mouth 4 (four) times daily as needed for muscle spasms.  120 tablet  5  . fentaNYL (DURAGESIC - DOSED MCG/HR) 75 MCG/HR Place 1 patch (75 mcg total) onto the skin every 3 (three) days. May fill on or after 07/28/14.  10 patch  0  . fluticasone (FLONASE) 50 MCG/ACT nasal spray Place 2 sprays into the nose daily.      Marland Kitchen loratadine (CLARITIN) 10 MG tablet Take 10 mg by mouth daily as needed.      Marland Kitchen losartan (COZAAR) 100 MG tablet Take 1 tablet (100 mg total) by mouth daily.  90 tablet  3  . metFORMIN (GLUCOPHAGE) 1000 MG tablet Take  1 tablet (1,000 mg total) by mouth 2 (two) times daily with a meal.  180 tablet  3  . Multiple Vitamin (MULTIVITAMIN) tablet Take 1 tablet by mouth daily.      . Oxycodone HCl 10 MG TABS Take 1 tablet (10 mg total) by mouth every 8 (eight) hours as needed. May fill on or after 07/28/14.  90 tablet  0  . pantoprazole (PROTONIX) 40 MG tablet Take 1 tablet (40 mg total) by mouth daily.  30 tablet  5  . aspirin 81 MG tablet Take 81 mg by mouth daily.      . fentaNYL (DURAGESIC) 75 MCG/HR Place 1 patch (75 mcg total) onto the skin every 3 (three) days. May fill on or after 06/29/14.  10 patch  0  . fentaNYL (DURAGESIC) 75 MCG/HR Place 1 patch (75 mcg total) onto the skin every 3 (three) days. May fill on or after 05/30/14.  10 patch  0  . Oxycodone HCl 10 MG TABS Take 1 tablet (10 mg total) by mouth every 8 (eight) hours as needed (pain). May fill on or after 05/30/14  90 tablet  0  .  Oxycodone HCl 10 MG TABS Take 1 tablet (10 mg total) by mouth every 8 (eight) hours as needed. May fill on or after 06/29/14.  90 tablet  0   No current facility-administered medications on file prior to visit.   Family History  Problem Relation Age of Onset  . Hypertension Father   . Diabetes Father   . Cancer Father   . Diabetes Sister   . Cancer Sister   . Cancer Mother    History   Social History  . Marital Status: Single    Spouse Name: N/A    Number of Children: N/A  . Years of Education: N/A   Occupational History  . Not on file.   Social History Main Topics  . Smoking status: Former Smoker -- 2.00 packs/day for 5 years    Types: Cigarettes    Quit date: 10/20/1998  . Smokeless tobacco: Former Systems developer     Comment: used chewing tobacco for about a year at age 58  . Alcohol Use: No  . Drug Use: No  . Sexual Activity: Not on file   Other Topics Concern  . Not on file   Social History Narrative  . No narrative on file   Review of Systems  HENT: Negative.   Eyes: Negative.   Respiratory:  Negative.   Cardiovascular: Positive for chest pain (shest wall) and leg swelling (occassionally). Negative for palpitations.  Gastrointestinal: Negative.   Genitourinary: Negative.   Musculoskeletal: Positive for back pain.  Neurological: Positive for tingling. Negative for dizziness.  Endo/Heme/Allergies: Negative for polydipsia.      Objective:   Filed Vitals:   06/01/14 1411  BP: 150/77  Pulse: 59  Temp: 98.6 F (37 C)  Resp: 14    Physical Exam: Constitutional: Patient appears well-developed and well-nourished. No distress. HENT: Normocephalic, atraumatic, External right and left ear normal. Oropharynx is clear and moist.  Eyes: Conjunctivae and EOM are normal. PERRLA, no scleral icterus. Neck: Normal ROM. Neck supple. No JVD. No tracheal deviation. No thyromegaly. CVS: RRR, S1/S2 +, no murmurs, no gallops, no carotid bruit.  Pulmonary: Effort and breath sounds normal, no stridor, rhonchi, wheezes, rales.  Abdominal: Soft. BS +, no distension, tenderness, rebound or guarding.  Musculoskeletal: Normal range of motion. No edema and no tenderness.  Lymphadenopathy: No lymphadenopathy noted, cervical Neuro: Alert. Normal reflexes, muscle tone coordination. No cranial nerve deficit. Skin: Skin is warm and dry. No rash noted. Not diaphoretic. No erythema. No pallor. Psychiatric: Normal mood and affect. Behavior, judgment, thought content normal.  Lab Results  Component Value Date   WBC 4.2 02/13/2011   HGB 10.0* 02/13/2011   HCT 29.6* 02/13/2011   MCV 89.4 02/13/2011   PLT 170 02/13/2011   Lab Results  Component Value Date   CREATININE 0.95 03/16/2013   BUN 21 03/16/2013   NA 137 03/16/2013   K 4.7 03/16/2013   CL 102 03/16/2013   CO2 27 03/16/2013    Lab Results  Component Value Date   HGBA1C 6.0 08/25/2013   Lipid Panel     Component Value Date/Time   CHOL 159 09/09/2010 2147   TRIG 221* 09/09/2010 2147   HDL 36* 09/09/2010 2147   CHOLHDL 4.4 Ratio 09/09/2010 2147    VLDL 44* 09/09/2010 2147   LDLCALC 79 09/09/2010 2147       Assessment and plan:   Lesslie was seen today for establish care, back pain and arthritis.  Diagnoses and associated orders for this visit:  Type  II or unspecified type diabetes mellitus with neurological manifestations, not stated as uncontrolled(250.60) - Glucose (CBG), Fasting - HgB A1c - COMPLETE METABOLIC PANEL WITH GFR - CBC - Lipid panel  Thoracic back pain, unspecified back pain laterality Continue meds from Dr. [name]  Joint pain - Sedimentation Rate - Rheumatoid factor - C-reactive protein - ANA - Cyclic citrul peptide antibody, IgG     Return in about 3 months (around 09/01/2014) for DM/HTN.    Chari Manning, NP-C [facility] [phone] 06/01/2014, 2:32 PM

## 2014-06-02 LAB — CBC
HEMATOCRIT: 37.8 % — AB (ref 39.0–52.0)
HEMOGLOBIN: 13.2 g/dL (ref 13.0–17.0)
MCH: 30.1 pg (ref 26.0–34.0)
MCHC: 34.9 g/dL (ref 30.0–36.0)
MCV: 86.3 fL (ref 78.0–100.0)
Platelets: 150 10*3/uL (ref 150–400)
RBC: 4.38 MIL/uL (ref 4.22–5.81)
RDW: 13.3 % (ref 11.5–15.5)
WBC: 5.8 10*3/uL (ref 4.0–10.5)

## 2014-06-02 LAB — LIPID PANEL
Cholesterol: 182 mg/dL (ref 0–200)
HDL: 35 mg/dL — ABNORMAL LOW (ref >39)
LDL CALC: 94 mg/dL (ref 0–99)
Total CHOL/HDL Ratio: 5.2 Ratio
Triglycerides: 263 mg/dL — ABNORMAL HIGH (ref <150)
VLDL: 53 mg/dL — AB (ref 0–40)

## 2014-06-02 LAB — COMPLETE METABOLIC PANEL WITH GFR
ALBUMIN: 4.6 g/dL (ref 3.5–5.2)
ALK PHOS: 93 U/L (ref 39–117)
ALT: 63 U/L — AB (ref 0–53)
AST: 40 U/L — ABNORMAL HIGH (ref 0–37)
BILIRUBIN TOTAL: 0.8 mg/dL (ref 0.2–1.2)
BUN: 15 mg/dL (ref 6–23)
CO2: 27 meq/L (ref 19–32)
Calcium: 9.1 mg/dL (ref 8.4–10.5)
Chloride: 101 mEq/L (ref 96–112)
Creat: 0.89 mg/dL (ref 0.50–1.35)
GFR, Est African American: >89 mL/min
GLUCOSE: 109 mg/dL — AB (ref 70–99)
POTASSIUM: 4.2 meq/L (ref 3.5–5.3)
SODIUM: 140 meq/L (ref 135–145)
Total Protein: 7.1 g/dL (ref 6.0–8.3)

## 2014-06-02 LAB — RHEUMATOID FACTOR

## 2014-06-02 LAB — C-REACTIVE PROTEIN: CRP: <0.5 mg/dL (ref <0.60)

## 2014-06-02 LAB — SEDIMENTATION RATE: SED RATE: 4 mm/h (ref 0–16)

## 2014-06-02 LAB — ANA: ANA: NEGATIVE

## 2014-06-02 LAB — CYCLIC CITRUL PEPTIDE ANTIBODY, IGG: Cyclic Citrullin Peptide Ab: <2 U/mL (ref 0.0–5.0)

## 2014-06-06 ENCOUNTER — Other Ambulatory Visit: Payer: Self-pay | Admitting: Internal Medicine

## 2014-06-12 ENCOUNTER — Telehealth: Payer: Self-pay | Admitting: *Deleted

## 2014-06-12 NOTE — Telephone Encounter (Signed)
Left VM to return my call

## 2014-06-21 LAB — HM DIABETES EYE EXAM

## 2014-08-07 ENCOUNTER — Other Ambulatory Visit: Payer: Self-pay | Admitting: *Deleted

## 2014-08-07 MED ORDER — GEMFIBROZIL 600 MG PO TABS: 600.0000 mg | ORAL_TABLET | Freq: Two times a day (BID) | ORAL | Status: DC

## 2014-08-07 NOTE — Progress Notes (Signed)
Pt is aware of his lab results. Sent the Lopid to his pharmacy.

## 2014-08-23 ENCOUNTER — Ambulatory Visit (INDEPENDENT_AMBULATORY_CARE_PROVIDER_SITE_OTHER): Payer: Self-pay | Admitting: Family Medicine

## 2014-08-23 VITALS — BP 144/90 | HR 57 | Temp 98.2°F | Resp 18 | Ht 71.0 in | Wt 203.0 lb

## 2014-08-23 DIAGNOSIS — Z79899 Other long term (current) drug therapy: Secondary | ICD-10-CM

## 2014-08-23 DIAGNOSIS — E119 Type 2 diabetes mellitus without complications: Secondary | ICD-10-CM

## 2014-08-23 DIAGNOSIS — I10 Essential (primary) hypertension: Secondary | ICD-10-CM

## 2014-08-23 DIAGNOSIS — E785 Hyperlipidemia, unspecified: Secondary | ICD-10-CM

## 2014-08-23 DIAGNOSIS — G894 Chronic pain syndrome: Secondary | ICD-10-CM

## 2014-08-23 DIAGNOSIS — E8881 Metabolic syndrome: Secondary | ICD-10-CM

## 2014-08-23 MED ORDER — LOVASTATIN 20 MG PO TABS: 20.0000 mg | ORAL_TABLET | Freq: Every day | ORAL | Status: DC

## 2014-08-23 MED ORDER — LOSARTAN POTASSIUM 100 MG PO TABS: 100.0000 mg | ORAL_TABLET | Freq: Every day | ORAL | Status: DC

## 2014-08-23 MED ORDER — CARISOPRODOL 350 MG PO TABS: 350.0000 mg | ORAL_TABLET | Freq: Four times a day (QID) | ORAL | Status: DC | PRN

## 2014-08-23 MED ORDER — FENTANYL 75 MCG/HR TD PT72: 75.0000 ug | MEDICATED_PATCH | TRANSDERMAL | Status: DC

## 2014-08-23 MED ORDER — METFORMIN HCL 1000 MG PO TABS: 1000.0000 mg | ORAL_TABLET | Freq: Two times a day (BID) | ORAL | Status: DC

## 2014-08-23 MED ORDER — OXYCODONE HCL 10 MG PO TABS: 10.0000 mg | ORAL_TABLET | Freq: Four times a day (QID) | ORAL | Status: DC | PRN

## 2014-08-23 NOTE — Patient Instructions (Addendum)
Start a fish oil supplement once to twice a day - look for the cheapest product that has a the most DHA/EPA (active ingredient). We will call you this week to let you know whether you need to schedule an appointment at [facility] in 3 months to get your cholesterol and liver rechecked. Call in 3 months and I will refill your medications by phone. I will see you back in 6 months.  Food Choices to Lower Your Triglycerides  Triglycerides are a type of fat in your blood. High levels of triglycerides can increase the risk of heart disease and stroke. If your triglyceride levels are high, the foods you eat and your eating habits are very important. Choosing the right foods can help lower your triglycerides.  WHAT GENERAL GUIDELINES DO I NEED TO FOLLOW?  Lose weight if you are overweight.   Limit or avoid alcohol.   Fill one half of your plate with vegetables and green salads.   Limit fruit to two servings a day. Choose fruit instead of juice.   Make one fourth of your plate whole grains. Look for the word "whole" as the first word in the ingredient list.  Fill one fourth of your plate with lean protein foods.  Enjoy fatty fish (such as salmon, mackerel, sardines, and tuna) three times a week.   Choose healthy fats.   Limit foods high in starch and sugar.  Eat more home-cooked food and less restaurant, buffet, and fast food.  Limit fried foods.  Cook foods using methods other than frying.  Limit saturated fats.  Check ingredient lists to avoid foods with partially hydrogenated oils (trans fats) in them. WHAT FOODS CAN I EAT?  Grains Whole grains, such as whole wheat or whole grain breads, crackers, cereals, and pasta. Unsweetened oatmeal, bulgur, barley, quinoa, or brown rice. Corn or whole wheat flour tortillas.  Vegetables Fresh or frozen vegetables (raw, steamed, roasted, or grilled). Green salads. Fruits All fresh, canned (in natural juice), or frozen  fruits. Meat and Other Protein Products Ground beef (85% or leaner), grass-fed beef, or beef trimmed of fat. Skinless chicken or [place]. Ground chicken or [place]. Pork trimmed of fat. All fish and seafood. Eggs. Dried beans, peas, or lentils. Unsalted nuts or seeds. Unsalted canned or dry beans. Dairy Low-fat dairy products, such as skim or 1% milk, 2% or reduced-fat cheeses, low-fat ricotta or cottage cheese, or plain low-fat yogurt. Fats and Oils Tub margarines without trans fats. Light or reduced-fat mayonnaise and salad dressings. Avocado. Safflower, olive, or canola oils. Natural peanut or almond butter. The items listed above may not be a complete list of recommended foods or beverages. Contact your dietitian for more options. WHAT FOODS ARE NOT RECOMMENDED?  Grains White bread. White pasta. White rice. Cornbread. Bagels, pastries, and croissants. Crackers that contain trans fat. Vegetables White potatoes. Corn. Creamed or fried vegetables. Vegetables in a cheese sauce. Fruits Dried fruits. Canned fruit in light or heavy syrup. Fruit juice. Meat and Other Protein Products Fatty cuts of meat. Ribs, chicken wings, bacon, sausage, bologna, salami, chitterlings, fatback, hot dogs, bratwurst, and packaged luncheon meats. Dairy Whole or 2% milk, cream, half-and-half, and cream cheese. Whole-fat or sweetened yogurt. Full-fat cheeses. Nondairy creamers and whipped toppings. Processed cheese, cheese spreads, or cheese curds. Sweets and Desserts Corn syrup, sugars, honey, and molasses. Candy. Jam and jelly. Syrup. Sweetened cereals. Cookies, pies, cakes, donuts, muffins, and ice cream. Fats and Oils Butter, stick margarine, lard, shortening, ghee, or bacon  fat. Coconut, palm kernel, or palm oils. Beverages Alcohol. Sweetened drinks (such as sodas, lemonade, and fruit drinks or punches). The items listed above may not be a complete list of foods and beverages to avoid. Contact your dietitian for  more information. Document Released: 07/24/2004 Document Revised: 10/11/2013 Document Reviewed: 08/10/2013 [facility] Patient Information 2015 [place], [place]. This information is not intended to replace advice given to you by your health care provider. Make sure you discuss any questions you have with your health care provider.  Chronic Back Pain  When back pain lasts longer than 3 months, it is called chronic back pain.People with chronic back pain often go through certain periods that are more intense (flare-ups).  CAUSES Chronic back pain can be caused by wear and tear (degeneration) on different structures in your back. These structures include:  The bones of your spine (vertebrae) and the joints surrounding your spinal cord and nerve roots (facets).  The strong, fibrous tissues that connect your vertebrae (ligaments). Degeneration of these structures may result in pressure on your nerves. This can lead to constant pain. HOME CARE INSTRUCTIONS  Avoid bending, heavy lifting, prolonged sitting, and activities which make the problem worse.  Take brief periods of rest throughout the day to reduce your pain. Lying down or standing usually is better than sitting while you are resting.  Take over-the-counter or prescription medicines only as directed by your caregiver. SEEK IMMEDIATE MEDICAL CARE IF:   You have weakness or numbness in one of your legs or feet.  You have trouble controlling your bladder or bowels.  You have nausea, vomiting, abdominal pain, shortness of breath, or fainting. Document Released: 11/13/2004 Document Revised: 12/29/2011 Document Reviewed: 09/20/2011 [facility] Patient Information 2015 [place], [place]. This information is not intended to replace advice given to you by your health care provider. Make sure you discuss any questions you have with your health care provider.

## 2014-08-23 NOTE — Progress Notes (Addendum)
Subjective:   This chart was scribed for Robert Cheadle MD by Forrestine Him, [facility] Scribe. This patient was seen in room 2 and the patient's care was started 5:34 PM.    Patient ID: Robert Burnett, male    DOB: Dec 06, 1960, 53 y.o.   MRN: [id number]  HPI  Chief Complaint  Patient presents with  . Medication Check     HPI Comments: [name] is a 53 y.o. male with a PMHx of Hyperlipidemia, DM, arthritis, and HTN who [facility] here for medication check today. Had appointment at [facility] with NP [name] who did lab work showing on 8/13. Hemoglobin of 6.1, lipid triglyceride 2.63, HDL 35, and LDL 94. Pt was started on Gemfibrozil 600 mg BID. 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 CBC normal. Screening test for RA were negative including sed rate, CRP, RF, anti CCP, and ANA. Pt has not yet started on Gemfibrozil medication but plans to start once he picks up prescription from pharmacy. [name] states he was previously on Crestor but was taken off for unspecified reasons.  He reports ongoing, constant, chronic generalized pain throughout his upper body. Pt attributes chronic pain to injury sustained several years ago after stepping in a hole in the ground while deer hunting. Pt reports constant but temporary relief with Fentanyl patches. However, he reports significant improvement with pain when previously on Tramadol.  Past Medical History  Diagnosis Date  . Hyperlipidemia   . Diabetes mellitus     toe amp 4/12  . Allergy   . Arthritis   . Hypertension     Current Outpatient Prescriptions on File Prior to Visit  Medication Sig Dispense Refill  . aspirin 81 MG tablet Take 81 mg by mouth daily.    Marland Kitchen atenolol (TENORMIN) 100 MG tablet Take 1 tablet (100 mg total) by mouth daily. 90 tablet 3  . carisoprodol (SOMA) 350 MG tablet Take 1 tablet (350 mg total) by mouth 4 (four) times daily as  needed for muscle spasms. 120 tablet 5  . fentaNYL (DURAGESIC - DOSED MCG/HR) 75 MCG/HR Place 1 patch (75 mcg total) onto the skin every 3 (three) days. May fill on or after 07/28/14. 10 patch 0  . fentaNYL (DURAGESIC) 75 MCG/HR Place 1 patch (75 mcg total) onto the skin every 3 (three) days. May fill on or after 06/29/14. 10 patch 0  . fentaNYL (DURAGESIC) 75 MCG/HR Place 1 patch (75 mcg total) onto the skin every 3 (three) days. May fill on or after 05/30/14. 10 patch 0  . fluticasone (FLONASE) 50 MCG/ACT nasal spray Place 2 sprays into the nose daily.    Marland Kitchen gemfibrozil (LOPID) 600 MG tablet Take 1 tablet (600 mg total) by mouth 2 (two) times daily before a meal. 60 tablet 2  . loratadine (CLARITIN) 10 MG tablet Take 10 mg by mouth daily as needed.    Marland Kitchen losartan (COZAAR) 100 MG tablet Take 1 tablet (100 mg total) by mouth daily. 90 tablet 3  . metFORMIN (GLUCOPHAGE) 1000 MG tablet Take 1 tablet (1,000 mg total) by mouth 2 (two) times daily with a meal. 180 tablet 3  . Multiple Vitamin (MULTIVITAMIN) tablet Take 1 tablet by mouth daily.    . Oxycodone HCl 10 MG TABS Take 1 tablet (10 mg total) by mouth every 8 (eight) hours as needed. May fill on or after  07/28/14. 90 tablet 0  . Oxycodone HCl 10 MG TABS Take 1 tablet (10 mg total) by mouth every 8 (eight) hours as needed (pain). May fill on or after 05/30/14 90 tablet 0  . Oxycodone HCl 10 MG TABS Take 1 tablet (10 mg total) by mouth every 8 (eight) hours as needed. May fill on or after 06/29/14. 90 tablet 0  . pantoprazole (PROTONIX) 40 MG tablet Take 1 tablet (40 mg total) by mouth daily. 30 tablet 5   No current facility-administered medications on file prior to visit.    Allergies  Allergen Reactions  . Prozac [Fluoxetine Hcl]   . Amlodipine Rash    Review of Systems  Musculoskeletal: Positive for arthralgias.     Triage Vitals: BP 144/90 mmHg  Pulse 57  Temp(Src) 98.2 F (36.8 C) (Oral)  Resp 18  Ht 5\' 11"  (1.803 m)  Wt 203 lb  (92.08 kg)  BMI 28.33 kg/m2  SpO2 99%      Objective:  Physical Exam  Constitutional: He appears well-developed and well-nourished. No distress.  HENT:  Head: Normocephalic and atraumatic.  Neck: Neck supple.  Cardiovascular: Normal rate, regular rhythm, S1 normal, S2 normal and normal heart sounds.   No murmur heard. Pulmonary/Chest: Effort normal and breath sounds normal.  Neurological: He is alert.  Skin: He is not diaphoretic.  Nursing note and vitals reviewed.  Results for orders placed or performed in visit on 06/01/14  Sedimentation Rate  Result Value Ref Range   Sed Rate 4 0 - 16 mm/hr  Rheumatoid factor  Result Value Ref Range   Rhuematoid fact SerPl-aCnc <10 <=14 IU/mL  C-reactive protein  Result Value Ref Range   CRP <0.5 <0.60 mg/dL  ANA  Result Value Ref Range   ANA Ser Ql NEG NEGATIVE  COMPLETE METABOLIC PANEL WITH GFR  Result Value Ref Range   Sodium 140 135 - 145 mEq/L   Potassium 4.2 3.5 - 5.3 mEq/L   Chloride 101 96 - 112 mEq/L   CO2 27 19 - 32 mEq/L   Glucose, Bld 109 (H) 70 - 99 mg/dL   BUN 15 6 - 23 mg/dL   Creat 0.89 0.50 - 1.35 mg/dL   Total Bilirubin 0.8 0.2 - 1.2 mg/dL   Alkaline Phosphatase 93 39 - 117 U/L   AST 40 (H) 0 - 37 U/L   ALT 63 (H) 0 - 53 U/L   Total Protein 7.1 6.0 - 8.3 g/dL   Albumin 4.6 3.5 - 5.2 g/dL   Calcium 9.1 8.4 - 10.5 mg/dL   GFR, Est African American >89 mL/min   GFR, Est Non African American >89 mL/min  CBC  Result Value Ref Range   WBC 5.8 4.0 - 10.5 K/uL   RBC 4.38 4.22 - 5.81 MIL/uL   Hemoglobin 13.2 13.0 - 17.0 g/dL   HCT 37.8 (L) 39.0 - 52.0 %   MCV 86.3 78.0 - 100.0 fL   MCH 30.1 26.0 - 34.0 pg   MCHC 34.9 30.0 - 36.0 g/dL   RDW 13.3 11.5 - 15.5 %   Platelets 150 150 - 400 K/uL  Lipid panel  Result Value Ref Range   Cholesterol 182 0 - 200 mg/dL   Triglycerides 263 (H) <150 mg/dL   HDL 35 (L) >39 mg/dL   Total CHOL/HDL Ratio 5.2 Ratio   VLDL 53 (H) 0 - 40 mg/dL   LDL Cholesterol 94 0 - 99 mg/dL    Cyclic citrul peptide antibody, IgG  Result Value Ref Range   Cyclic Citrullin Peptide Ab <2.0 0.0 - 5.0 U/mL  Glucose (CBG), Fasting  Result Value Ref Range   Glucose Fasting, POC 117 (A) 70 - 99 mg/dL  HgB A1c  Result Value Ref Range   Hemoglobin A1C 6.1     Assessment & Plan:   Type 2 diabetes mellitus without complication - Plan: Hemoglobin A1c - well controlled on metformin  Encounter for long-term (current) use of medications - Plan: Comprehensive metabolic panel  Hyperlipidemia LDL goal <100 - Plan: Lipid panel - will try statin on $4 rather than gemfibrozil due to statin indication w/ DM.  Rec flp and cmp in 3-4 mos after starting statin.  Entered order for future labs in hopes that pt can get these done at the [facility] since pt is self-pay - will ask staff to call [facility] to se if pt can get these done in a lab only visit or does he have to see their provider.  Pt did well on crestor prev.  Chronic pain syndrome - Pt will continue with Fentanyl patches and Oxycodone regimen to help manage current ongoing pain. Would still like improved pain control so will increase oxydocone from tid to qid prn.  Ok to call in 3 mos for refills.  Essential hypertension, benign  Metabolic syndrome  Meds ordered this encounter  Medications  . lovastatin (MEVACOR) 20 MG tablet    Sig: Take 1 tablet (20 mg total) by mouth at bedtime.    Dispense:  90 tablet    Refill:  3  . carisoprodol (SOMA) 350 MG tablet    Sig: Take 1 tablet (350 mg total) by mouth 4 (four) times daily as needed for muscle spasms.    Dispense:  120 tablet    Refill:  5  . fentaNYL (DURAGESIC - DOSED MCG/HR) 75 MCG/HR    Sig: Place 1 patch (75 mcg total) onto the skin every 3 (three) days. May fill on or after 08/23/14.    Dispense:  10 patch    Refill:  0  . fentaNYL (DURAGESIC) 75 MCG/HR    Sig: Place 1 patch (75 mcg total) onto the skin every 3 (three) days. May fill on or after 09/19/14.    Dispense:  10 patch     Refill:  0  . fentaNYL (DURAGESIC) 75 MCG/HR    Sig: Place 1 patch (75 mcg total) onto the skin every 3 (three) days. May fill on or after 10/20/14.    Dispense:  10 patch    Refill:  0  . Oxycodone HCl 10 MG TABS    Sig: Take 1 tablet (10 mg total) by mouth every 6 (six) hours as needed (pain). May fill on or after 08/23/14.    Dispense:  120 tablet    Refill:  0  . Oxycodone HCl 10 MG TABS    Sig: Take 1 tablet (10 mg total) by mouth every 6 (six) hours as needed (pain). May fill on or after 09/19/14    Dispense:  120 tablet    Refill:  0  . Oxycodone HCl 10 MG TABS    Sig: Take 1 tablet (10 mg total) by mouth every 6 (six) hours as needed. May fill on or after 10/20/14.    Dispense:  120 tablet    Refill:  0  . losartan (COZAAR) 100 MG tablet    Sig: Take 1 tablet (100 mg total) by mouth daily.    Dispense:  90 tablet  Refill:  3  . metFORMIN (GLUCOPHAGE) 1000 MG tablet    Sig: Take 1 tablet (1,000 mg total) by mouth 2 (two) times daily with a meal.    Dispense:  180 tablet    Refill:  3    I personally performed the services described in this documentation, which was scribed in my presence. The recorded information has been reviewed and considered, and addended by me as needed.  Robert Cheadle, MD MPH

## 2014-08-31 ENCOUNTER — Telehealth: Payer: Self-pay

## 2014-08-31 NOTE — Telephone Encounter (Signed)
-----   Message from [name], MD sent at 08/24/2014 12:27 AM EST ----- Please call the [facility] to ask if this pt who was seen there 3 months prior would be able to come to that clinic just for labs only in 3 mos that I have ordered (cmp and lipids).  Does he need to see one of their doctors to have this drawn or could he come to their clinic for a lab only visit? He does not have any health insurance but does have the orange card which is why he cannot afford the labs here.

## 2014-09-01 NOTE — Telephone Encounter (Signed)
LM for pt to rtn call. 

## 2014-09-01 NOTE — Telephone Encounter (Signed)
Called [place] was advised pt may go to this clinic to have labs completed. He would not need to see a provider. They would be able to send the blood work to the lab.

## 2014-09-05 NOTE — Telephone Encounter (Signed)
LM for rtn call. 

## 2014-09-06 NOTE — Telephone Encounter (Signed)
Lm for pt to rtn to [facility] for labs. Rtn call with any questions.

## 2014-11-14 ENCOUNTER — Other Ambulatory Visit: Payer: Self-pay

## 2014-11-14 NOTE — Telephone Encounter (Signed)
Dr [name] pended both w/fill dates on/after 11/19/14 (last was for 10/20/14).

## 2014-11-14 NOTE — Telephone Encounter (Signed)
Patient needs refill from Dr. [name] on the medication below.   fentaNYL (DURAGESIC) 75 MCG/HR [[id number]]    Oxycodone HCl 10 MG TABS [[id number]]   Pt # [phone]

## 2014-11-18 MED ORDER — FENTANYL 75 MCG/HR TD PT72: 75.0000 ug | MEDICATED_PATCH | TRANSDERMAL | Status: DC

## 2014-11-18 MED ORDER — OXYCODONE HCL 10 MG PO TABS: 10.0000 mg | ORAL_TABLET | Freq: Four times a day (QID) | ORAL | Status: DC | PRN

## 2014-11-18 NOTE — Telephone Encounter (Signed)
Dr. [name], pt calling to check on the status of this rx. Please advise. Thanks

## 2014-11-19 ENCOUNTER — Other Ambulatory Visit: Payer: Self-pay | Admitting: Family Medicine

## 2014-11-19 MED ORDER — OXYCODONE HCL 10 MG PO TABS: 10.0000 mg | ORAL_TABLET | Freq: Four times a day (QID) | ORAL | Status: DC | PRN

## 2014-11-19 MED ORDER — FENTANYL 75 MCG/HR TD PT72: 75.0000 ug | MEDICATED_PATCH | TRANSDERMAL | Status: DC

## 2015-02-24 ENCOUNTER — Other Ambulatory Visit: Payer: Self-pay

## 2015-02-24 MED ORDER — PANTOPRAZOLE SODIUM 40 MG PO TBEC: 40.0000 mg | DELAYED_RELEASE_TABLET | Freq: Every day | ORAL | Status: DC

## 2015-03-18 ENCOUNTER — Ambulatory Visit (INDEPENDENT_AMBULATORY_CARE_PROVIDER_SITE_OTHER): Payer: Self-pay | Admitting: Family Medicine

## 2015-03-18 VITALS — BP 138/78 | HR 57 | Temp 98.8°F | Resp 20 | Ht 70.75 in | Wt 195.4 lb

## 2015-03-18 DIAGNOSIS — E1165 Type 2 diabetes mellitus with hyperglycemia: Secondary | ICD-10-CM

## 2015-03-18 DIAGNOSIS — G894 Chronic pain syndrome: Secondary | ICD-10-CM

## 2015-03-18 LAB — POCT GLYCOSYLATED HEMOGLOBIN (HGB A1C): Hemoglobin A1C: 10.8

## 2015-03-18 MED ORDER — OXYCODONE HCL 10 MG PO TABS: 10.0000 mg | ORAL_TABLET | Freq: Four times a day (QID) | ORAL | Status: DC | PRN

## 2015-03-18 MED ORDER — NAPROXEN SODIUM 275 MG PO TABS: 275.0000 mg | ORAL_TABLET | Freq: Two times a day (BID) | ORAL | Status: DC

## 2015-03-18 MED ORDER — FENTANYL 75 MCG/HR TD PT72: 75.0000 ug | MEDICATED_PATCH | TRANSDERMAL | Status: DC

## 2015-03-18 MED ORDER — PANTOPRAZOLE SODIUM 40 MG PO TBEC: 40.0000 mg | DELAYED_RELEASE_TABLET | Freq: Every day | ORAL | Status: DC

## 2015-03-18 MED ORDER — GLIPIZIDE 5 MG PO TABS: 5.0000 mg | ORAL_TABLET | Freq: Every day | ORAL | Status: DC

## 2015-03-18 MED ORDER — CARISOPRODOL 350 MG PO TABS: 350.0000 mg | ORAL_TABLET | Freq: Four times a day (QID) | ORAL | Status: DC | PRN

## 2015-03-18 MED ORDER — ATENOLOL 100 MG PO TABS: 100.0000 mg | ORAL_TABLET | Freq: Every day | ORAL | Status: DC

## 2015-03-18 NOTE — Patient Instructions (Signed)
Start the glipizide.  If you skip dinner or eat a very light dinner, skip the dinner dose of glipizide. It can drop your sugars to low so make sure you eat small meals regularly.   Diabetes Mellitus and Food It is important for you to manage your blood sugar (glucose) level. Your blood glucose level can be greatly affected by what you eat. Eating healthier foods in the appropriate amounts throughout the day at about the same time each day will help you control your blood glucose level. It can also help slow or prevent worsening of your diabetes mellitus. Healthy eating may even help you improve the level of your blood pressure and reach or maintain a healthy weight.  HOW CAN FOOD AFFECT ME? Carbohydrates Carbohydrates affect your blood glucose level more than any other type of food. Your dietitian will help you determine how many carbohydrates to eat at each meal and teach you how to count carbohydrates. Counting carbohydrates is important to keep your blood glucose at a healthy level, especially if you are using insulin or taking certain medicines for diabetes mellitus. Alcohol Alcohol can cause sudden decreases in blood glucose (hypoglycemia), especially if you use insulin or take certain medicines for diabetes mellitus. Hypoglycemia can be a life-threatening condition. Symptoms of hypoglycemia (sleepiness, dizziness, and disorientation) are similar to symptoms of having too much alcohol.  If your health care provider has given you approval to drink alcohol, do so in moderation and use the following guidelines:  Women should not have more than one drink per day, and men should not have more than two drinks per day. One drink is equal to:  12 oz of beer.  5 oz of wine.  1 oz of hard liquor.  Do not drink on an empty stomach.  Keep yourself hydrated. Have water, diet soda, or unsweetened iced tea.  Regular soda, juice, and other mixers might contain a lot of carbohydrates and should be  counted. WHAT FOODS ARE NOT RECOMMENDED? As you make food choices, it is important to remember that all foods are not the same. Some foods have fewer nutrients per serving than other foods, even though they might have the same number of calories or carbohydrates. It is difficult to get your body what it needs when you eat foods with fewer nutrients. Examples of foods that you should avoid that are high in calories and carbohydrates but low in nutrients include:  Trans fats (most processed foods list trans fats on the Nutrition Facts label).  Regular soda.  Juice.  Candy.  Sweets, such as cake, pie, doughnuts, and cookies.  Fried foods. WHAT FOODS CAN I EAT? Have nutrient-rich foods, which will nourish your body and keep you healthy. The food you should eat also will depend on several factors, including:  The calories you need.  The medicines you take.  Your weight.  Your blood glucose level.  Your blood pressure level.  Your cholesterol level. You also should eat a variety of foods, including:  Protein, such as meat, poultry, fish, tofu, nuts, and seeds (lean animal proteins are best).  Fruits.  Vegetables.  Dairy products, such as milk, cheese, and yogurt (low fat is best).  Breads, grains, pasta, cereal, rice, and beans.  Fats such as olive oil, trans fat-free margarine, canola oil, avocado, and olives. DOES EVERYONE WITH DIABETES MELLITUS HAVE THE SAME MEAL PLAN? Because every person with diabetes mellitus is different, there is not one meal plan that works for everyone. It is very  important that you meet with a dietitian who will help you create a meal plan that is just right for you. Document Released: 07/03/2005 Document Revised: 10/11/2013 Document Reviewed: 09/02/2013 [facility] Patient Information 2015 [place], [place]. This information is not intended to replace advice given to you by your health care provider. Make sure you discuss any questions you have with your  health care provider.   Diabetes and Standards of Medical Care Diabetes is complicated. You may find that your diabetes team includes a dietitian, nurse, diabetes educator, eye doctor, and more. To help everyone know what is going on and to help you get the care you deserve, the following schedule of care was developed to help keep you on track. Below are the tests, exams, vaccines, medicines, education, and plans you will need. HbA1c test This test shows how well you have controlled your glucose over the past 2-3 months. It is used to see if your diabetes management plan needs to be adjusted.   It is performed at least 2 times a year if you are meeting treatment goals.  It is performed 4 times a year if therapy has changed or if you are not meeting treatment goals. Blood pressure test  This test is performed at every routine medical visit. The goal is less than 140/90 mm Hg for most people, but 130/80 mm Hg in some cases. Ask your health care provider about your goal. Dental exam  Follow up with the dentist regularly. Eye exam  If you are diagnosed with type 1 diabetes as a child, get an exam upon reaching the age of 30 years or older and have had diabetes for 3-5 years. Yearly eye exams are recommended after that initial eye exam.  If you are diagnosed with type 1 diabetes as an adult, get an exam within 5 years of diagnosis and then yearly.  If you are diagnosed with type 2 diabetes, get an exam as soon as possible after the diagnosis and then yearly. Foot care exam  Visual foot exams are performed at every routine medical visit. The exams check for cuts, injuries, or other problems with the feet.  A comprehensive foot exam should be done yearly. This includes visual inspection as well as assessing foot pulses and testing for loss of sensation.  Check your feet nightly for cuts, injuries, or other problems with your feet. Tell your health care provider if anything is not  healing. Kidney function test (urine microalbumin)  This test is performed once a year.  Type 1 diabetes: The first test is performed 5 years after diagnosis.  Type 2 diabetes: The first test is performed at the time of diagnosis.  A serum creatinine and estimated glomerular filtration rate (eGFR) test is done once a year to assess the level of chronic kidney disease (CKD), if present. Lipid profile (cholesterol, HDL, LDL, triglycerides)  Performed every 5 years for most people.  The goal for LDL is less than 100 mg/dL. If you are at high risk, the goal is less than 70 mg/dL.  The goal for HDL is 40 mg/dL-50 mg/dL for men and 50 mg/dL-60 mg/dL for women. An HDL cholesterol of 60 mg/dL or higher gives some protection against heart disease.  The goal for triglycerides is less than 150 mg/dL. Influenza vaccine, pneumococcal vaccine, and hepatitis B vaccine  The influenza vaccine is recommended yearly.  It is recommended that people with diabetes who are over 3 years old get the pneumonia vaccine. In some cases, two separate  shots may be given. Ask your health care provider if your pneumonia vaccination is up to date.  The hepatitis B vaccine is also recommended for adults with diabetes. Diabetes self-management education  Education is recommended at diagnosis and ongoing as needed. Treatment plan  Your treatment plan is reviewed at every medical visit. Document Released: 08/03/2009 Document Revised: 02/20/2014 Document Reviewed: 03/08/2013 [facility] Patient Information 2015 [place], [place]. This information is not intended to replace advice given to you by your health care provider. Make sure you discuss any questions you have with your health care provider.  Complementary and Alternative Medical Therapies for Diabetes Complementary and alternative medicines are health care practices or products that are not always accepted as part of routine medicine. Complementary medicine is used  along with routine medicine (medical therapy). Alternative medicine can sometimes be used instead of routine medicine. Some people use these methods to treat diabetes. While some of these therapies may be effective, others may not be. Some may even be harmful. Patients using these methods need to tell their caregiver. It is important to let your caregivers know what you are doing. Some of these therapies are discussed below. For more information, talk with your caregiver. THERAPIES Acupuncture Acupuncture is done by a professional who inserts needles into certain points on the skin. Some scientists believe that this triggers the release of the body's natural painkillers. It has been shown to relieve long-term (chronic) pain. This may help patients with painful nerve damage caused by diabetes. Biofeedback Biofeedback helps a person become more aware of the body's response to pain. It also helps you learn to deal with the pain. This alternative therapy focuses on relaxation and stress-reduction techniques. Thinking of peaceful mental images (guided imagery) is one technique. Some people believe these images can ease their condition. MEDICATIONS Chromium Several studies report that chromium supplements may improve diabetes control. Chromium helps insulin improve its action. Research is not yet certain. Supplements have not been recommended or approved. Caution is needed if you have kidney (renal) problems. Ginseng There are several types of ginseng plants. American ginseng is used for diabetes studies. Those studies have shown some glucose-lowering effects. Those effects have been seen with fasting and after-meal blood glucose levels. They have also been seen in A1c levels (average blood glucose levels over a 83-monthperiod). More long-term studies are needed before recommendations for use of ginseng can be made. Magnesium Experts have studied the relationship between magnesium and diabetes for many years.  But it is not yet fully understood. Studies suggest that a low amount of magnesium may make blood glucose control worse in type 2 diabetes. Research also shows that a low amount may contribute to certain diabetes complications. One study showed that people who consume more magnesium had less risk of type 2 diabetes. Eating whole grains, nuts, and green leafy vegetables raises the magnesium level. Vanadium Vanadium is a compound found in tiny amounts in plants and animals. Early studies showed that vanadium improved blood glucose levels in animals with type 1 and type 2 diabetes. One study found that when given vanadium, those with diabetes were able to decrease their insulin dosage. Researchers still need to learn how it works in the body to discover any side effects, and to find safe dosages. Cinnamon There have been a couple of studies that seem to indicate cinnamon decreases insulin resistance and increases insulin production. By doing so, it may lower blood glucose. Exact doses are unknown, but it may work best when used  in combination with other diabetes medicines. Document Released: 08/03/2007 Document Revised: 12/29/2011 Document Reviewed: 08/16/2009 [facility] Patient Information 2015 [place], [place]. This information is not intended to replace advice given to you by your health care provider. Make sure you discuss any questions you have with your health care provider.

## 2015-03-18 NOTE — Progress Notes (Signed)
Subjective:  This chart was scribed for Robert Cheadle, MD, by [name], Medical Scribe. This patient was seen in room 8 and the patient's care was started at 1:57 PM.   Patient ID: Robert Burnett, male    DOB: [birth year], 54 y.o.   MRN: [id number] Chief Complaint  Patient presents with  . Medication Refill  . Shoulder Pain    Right  . Depression    per Triage Screen    HPI HPI Comments: Robert Burnett is a 54 y.o. male with a history of DM, HTN, HLD, and chronic right shoulder pain who presents to [facility] for medication refill.  He denies any complications with his medications.     His most recent A1C was 6.1 on 8/15.  He reports his blood glucose has been measured at 200s recently but that he has been consuming more Coca-Cola.  He measure his blood glucose this morning at > 300 after after several Cokes.  He typically does not eat breakfast because the pain decreases his appetite.  He denies frequency, polydipsia.    He measures his BP at home but not daily and it is well-controlled.    He will run out of his pain medications today.  He is compliant with his pain medication and is still in a moderate amount of pain but he feels with the medication he can complete many activities of daily living with .  Recently, he was able help remodel a friend's bathroom and mow his lawn without significant pain.    He has been taking his HLD medication that was prescribed at last visit.  He notes some increasing soreness sin the right upper arm but is unsure of the cause.    Patient reports an intermittent, mildly depressed mood that he attributes to his circumstances but says, overall, his mood has been fine.  He denies SI.     He takes Protonix once every 3-4 days with relief of his GERD.    Patient also notes today some intermittent, mild right shoulder pain.  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  At the time he was seen by a physician and told that he would ultimately need surgery but  the patient never addressed it.  He has had intermittent, recently worsening pain in the shoulder since with certain motions.    The patient is interested in donating plasma and was told he needs his doctor to fill out a form stating he is fit to donate.  He has been told that his medications do not prohibit him form donating as long he waits for a certain period of time between taking the medication and donating.   Past Medical History  Diagnosis Date  . Hyperlipidemia   . Diabetes mellitus     toe amp 4/12  . Allergy   . Arthritis   . Hypertension    Current Outpatient Prescriptions on File Prior to Visit  Medication Sig Dispense Refill  . aspirin 81 MG tablet Take 81 mg by mouth daily.    Marland Kitchen atenolol (TENORMIN) 100 MG tablet Take 1 tablet (100 mg total) by mouth daily. 90 tablet 3  . carisoprodol (SOMA) 350 MG tablet Take 1 tablet (350 mg total) by mouth 4 (four) times daily as needed for muscle spasms. 120 tablet 5  . fentaNYL (DURAGESIC - DOSED MCG/HR) 75 MCG/HR Place 1 patch (75 mcg total) onto the skin every 3 (three) days. May fill 30 days after script. 10 patch 0  .  fluticasone (FLONASE) 50 MCG/ACT nasal spray Place 2 sprays into the nose daily.    Marland Kitchen loratadine (CLARITIN) 10 MG tablet Take 10 mg by mouth daily as needed.    Marland Kitchen losartan (COZAAR) 100 MG tablet Take 1 tablet (100 mg total) by mouth daily. 90 tablet 3  . lovastatin (MEVACOR) 20 MG tablet Take 1 tablet (20 mg total) by mouth at bedtime. 90 tablet 3  . metFORMIN (GLUCOPHAGE) 1000 MG tablet Take 1 tablet (1,000 mg total) by mouth 2 (two) times daily with a meal. 180 tablet 3  . Multiple Vitamin (MULTIVITAMIN) tablet Take 1 tablet by mouth daily.    . Oxycodone HCl 10 MG TABS Take 1 tablet (10 mg total) by mouth every 6 (six) hours as needed (pain). May fill 30 days after script. 120 tablet 0  . pantoprazole (PROTONIX) 40 MG tablet Take 1 tablet (40 mg total) by mouth daily. PT DUE FOR CHECK UP FOR MORE REFILLS. 30 tablet 0     No current facility-administered medications on file prior to visit.   Allergies  Allergen Reactions  . Prozac [Fluoxetine Hcl]   . Amlodipine Rash   Review of Systems  Constitutional: Positive for appetite change. Negative for unexpected weight change.  Endocrine: Negative for polyphagia.  Musculoskeletal: Positive for arthralgias.  Psychiatric/Behavioral: Negative for suicidal ideas.       Objective:  BP 138/78 mmHg  Pulse 57  Temp(Src) 98.8 F (37.1 C) (Oral)  Resp 20  Ht 5' 10.75" (1.797 m)  Wt 195 lb 6 oz (88.622 kg)  BMI 27.44 kg/m2  SpO2 99%  Physical Exam  Constitutional: He is oriented to person, place, and time. He appears well-developed and well-nourished. No distress.  HENT:  Head: Normocephalic and atraumatic.  Eyes: Conjunctivae and EOM are normal.  Neck: Neck supple. No tracheal deviation present.  Cardiovascular: Normal rate.   Pulmonary/Chest: Effort normal. No respiratory distress.  Musculoskeletal: Normal range of motion.  FROM, normal internal and external rotation.  Weakened 4+/5 rotator strength including IR and ER and empty can.  Negative Neer's and Hawkin's.  Crepitus in right shoulder with mild flexion and internal rotation.   Neurological: He is alert and oriented to person, place, and time.  Skin: Skin is warm and dry.  Psychiatric: He has a normal mood and affect. His behavior is normal.  Nursing note and vitals reviewed.  Results for orders placed or performed in visit on 03/18/15  POCT glycosylated hemoglobin (Hb A1C)  Result Value Ref Range   Hemoglobin A1C 10.8       Assessment & Plan:  2:08 PM Patient advised to stop taking HLD medication for 2 weeks to test whether this is the cause of increased right upper arm soreness.  Will refill all medications and order labs.  Patient should f/u with [facility] for shoulder pain so we can get imaging - xray.  If DM is under better control, I would be happy to try an  intraarticular cortisone injection to see if that could help 1. Type 2 diabetes mellitus with hyperglycemia - work on diet, cont metformin, start glipizide  2. Chronic pain syndrome - on controlled drug contract w/ me. meds refilled. F/u in 4 mos for additional refills   Today I have utilized the [place] Controlled Substance Registry's online query to confirm compliance regarding the patient's narcotic pain medications. My review reveals appropriate prescription fills and that [facility] is the sole provider of these medications. Rechecks  will occur regularly and the patient is aware of our use of the system.  Orders Placed This Encounter  Procedures  . POCT glycosylated hemoglobin (Hb A1C)    Meds ordered this encounter  Medications  . DISCONTD: fentaNYL (DURAGESIC - DOSED MCG/HR) 75 MCG/HR    Sig: Place 1 patch (75 mcg total) onto the skin every 3 (three) days. May fill 30 days after script.    Dispense:  10 patch    Refill:  0  . DISCONTD: fentaNYL (DURAGESIC) 75 MCG/HR    Sig: Place 1 patch (75 mcg total) onto the skin every 3 (three) days. May fill 60 days after script.    Dispense:  10 patch    Refill:  0  . DISCONTD: fentaNYL (DURAGESIC) 75 MCG/HR    Sig: Place 1 patch (75 mcg total) onto the skin every 3 (three) days. May fill 90 days after script.    Dispense:  10 patch    Refill:  0  . pantoprazole (PROTONIX) 40 MG tablet    Sig: Take 1 tablet (40 mg total) by mouth daily.    Dispense:  90 tablet    Refill:  3  . DISCONTD: fentaNYL (DURAGESIC) 75 MCG/HR    Sig: Place 1 patch (75 mcg total) onto the skin every 3 (three) days.    Dispense:  10 patch    Refill:  0  . DISCONTD: Oxycodone HCl 10 MG TABS    Sig: Take 1 tablet (10 mg total) by mouth every 6 (six) hours as needed. May fill 90 days after script.    Dispense:  120 tablet    Refill:  0  . DISCONTD: Oxycodone HCl 10 MG TABS    Sig: Take 1 tablet (10 mg total) by mouth every 6 (six) hours as needed  (pain). May fill 30 days after script.    Dispense:  120 tablet    Refill:  0  . DISCONTD: Oxycodone HCl 10 MG TABS    Sig: Take 1 tablet (10 mg total) by mouth every 6 (six) hours as needed (pain). May fill 60 days after script    Dispense:  120 tablet    Refill:  0  . DISCONTD: Oxycodone HCl 10 MG TABS    Sig: Take 1 tablet (10 mg total) by mouth every 6 (six) hours as needed (pain).    Dispense:  120 tablet    Refill:  0  . carisoprodol (SOMA) 350 MG tablet    Sig: Take 1 tablet (350 mg total) by mouth 4 (four) times daily as needed for muscle spasms.    Dispense:  120 tablet    Refill:  5  . atenolol (TENORMIN) 100 MG tablet    Sig: Take 1 tablet (100 mg total) by mouth daily.    Dispense:  90 tablet    Refill:  3  . glipiZIDE (GLUCOTROL) 5 MG tablet    Sig: Take 1 tablet (5 mg total) by mouth daily before breakfast. And 1/2 tab with dinner    Dispense:  60 tablet    Refill:  5  . fentaNYL (DURAGESIC - DOSED MCG/HR) 75 MCG/HR    Sig: Place 1 patch (75 mcg total) onto the skin every 3 (three) days. May fill 30 days after script.    Dispense:  10 patch    Refill:  0  . fentaNYL (DURAGESIC) 75 MCG/HR    Sig: Place 1 patch (75 mcg total) onto the skin every 3 (three) days. May  fill 60 days after script.    Dispense:  10 patch    Refill:  0  . fentaNYL (DURAGESIC) 75 MCG/HR    Sig: Place 1 patch (75 mcg total) onto the skin every 3 (three) days. May fill 90 days after script.    Dispense:  10 patch    Refill:  0  . fentaNYL (DURAGESIC) 75 MCG/HR    Sig: Place 1 patch (75 mcg total) onto the skin every 3 (three) days.    Dispense:  10 patch    Refill:  0  . Oxycodone HCl 10 MG TABS    Sig: Take 1 tablet (10 mg total) by mouth every 6 (six) hours as needed. May fill 90 days after script.    Dispense:  120 tablet    Refill:  0  . Oxycodone HCl 10 MG TABS    Sig: Take 1 tablet (10 mg total) by mouth every 6 (six) hours as needed (pain). May fill 30 days after script.     Dispense:  120 tablet    Refill:  0  . Oxycodone HCl 10 MG TABS    Sig: Take 1 tablet (10 mg total) by mouth every 6 (six) hours as needed (pain). May fill 60 days after script    Dispense:  120 tablet    Refill:  0  . Oxycodone HCl 10 MG TABS    Sig: Take 1 tablet (10 mg total) by mouth every 6 (six) hours as needed (pain).    Dispense:  120 tablet    Refill:  0  . naproxen sodium (ANAPROX) 275 MG tablet    Sig: Take 1 tablet (275 mg total) by mouth 2 (two) times daily with a meal.    Dispense:  60 tablet    Refill:  5    I personally performed the services described in this documentation, which was scribed in my presence. The recorded information has been reviewed and considered, and addended by me as needed.  Robert Cheadle, MD MPH

## 2015-03-27 ENCOUNTER — Other Ambulatory Visit: Payer: Self-pay | Admitting: Family Medicine

## 2015-03-30 NOTE — Telephone Encounter (Signed)
Faxed

## 2015-05-24 ENCOUNTER — Encounter: Payer: Self-pay | Admitting: *Deleted

## 2015-07-14 ENCOUNTER — Ambulatory Visit (INDEPENDENT_AMBULATORY_CARE_PROVIDER_SITE_OTHER): Payer: Self-pay | Admitting: Family Medicine

## 2015-07-14 VITALS — BP 130/80 | HR 60 | Temp 98.3°F | Resp 16 | Ht 72.0 in | Wt 195.0 lb

## 2015-07-14 DIAGNOSIS — M5136 Other intervertebral disc degeneration, lumbar region: Secondary | ICD-10-CM

## 2015-07-14 DIAGNOSIS — G894 Chronic pain syndrome: Secondary | ICD-10-CM

## 2015-07-14 DIAGNOSIS — M940 Chondrocostal junction syndrome [Tietze]: Secondary | ICD-10-CM

## 2015-07-14 DIAGNOSIS — R0789 Other chest pain: Secondary | ICD-10-CM

## 2015-07-14 DIAGNOSIS — E1165 Type 2 diabetes mellitus with hyperglycemia: Secondary | ICD-10-CM

## 2015-07-14 DIAGNOSIS — M129 Arthropathy, unspecified: Secondary | ICD-10-CM

## 2015-07-14 DIAGNOSIS — M19019 Primary osteoarthritis, unspecified shoulder: Secondary | ICD-10-CM

## 2015-07-14 DIAGNOSIS — E1142 Type 2 diabetes mellitus with diabetic polyneuropathy: Secondary | ICD-10-CM

## 2015-07-14 DIAGNOSIS — R5381 Other malaise: Secondary | ICD-10-CM

## 2015-07-14 DIAGNOSIS — G8929 Other chronic pain: Secondary | ICD-10-CM

## 2015-07-14 DIAGNOSIS — G629 Polyneuropathy, unspecified: Secondary | ICD-10-CM

## 2015-07-14 LAB — GLUCOSE, POCT (MANUAL RESULT ENTRY): POC Glucose: 150 mg/dl — AB (ref 70–99)

## 2015-07-14 LAB — POCT GLYCOSYLATED HEMOGLOBIN (HGB A1C): HEMOGLOBIN A1C: 8.9

## 2015-07-14 MED ORDER — FENTANYL 75 MCG/HR TD PT72: 75.0000 ug | MEDICATED_PATCH | TRANSDERMAL | Status: DC

## 2015-07-14 MED ORDER — METHYLPREDNISOLONE ACETATE 80 MG/ML IJ SUSP
40.0000 mg | Freq: Once | INTRAMUSCULAR | Status: AC
  Administered 2015-07-14: 40 mg via INTRA_ARTICULAR

## 2015-07-14 MED ORDER — LOSARTAN POTASSIUM 100 MG PO TABS: 100.0000 mg | ORAL_TABLET | Freq: Every day | ORAL | Status: DC

## 2015-07-14 MED ORDER — OXYCODONE HCL 10 MG PO TABS: 10.0000 mg | ORAL_TABLET | Freq: Four times a day (QID) | ORAL | Status: DC | PRN

## 2015-07-14 MED ORDER — FENTANYL 75 MCG/HR TD PT72
75.0000 ug | MEDICATED_PATCH | TRANSDERMAL | Status: DC
Start: 2015-07-14 — End: 2015-11-07

## 2015-07-14 MED ORDER — CARISOPRODOL 350 MG PO TABS: ORAL_TABLET | ORAL | Status: DC

## 2015-07-14 NOTE — Progress Notes (Signed)
Subjective:  This chart was scribed for Robert Cheadle, MD by [facility], medical scribe at [facility].The patient was seen in exam room 13 and the patient's care was started at 10:10 AM.   Patient ID: Robert Burnett, male    DOB: 19-Apr-1961, 54 y.o.   MRN: [id number] Chief Complaint  Patient presents with  . Medication Refill    oxycodone, fentanyl patch  . Shoulder Pain    x 8 months previous injury   HPI  HPI Comments: Robert Burnett is a 54 y.o. male who presents to [facility] for a medication refill and a shoulder pain.  Chronic pain contract with me 5 years. Chronic thoracic pain radiating around his chest after falling into a hole while hunting several years ago. Taken out work, walks with a cane. Trouble doing many activities. Out of work for so long since he not been working for the past 5 years. Pt was being seen [facility] but the cost of these visits was difficult for the patient to afford. He has established with and [facility] but only seen 1 a year ago. Did have lab work at that time. Pt had his A1C at last visit 4 months ago, which was 10.8 up from 6.1 in years prior. Does not have any appointments scheduled at [facility]. Last visit pt decided to stop drinking sodas. Increased right upper arm pain, unsure of cause. Dislocated the shoulder in the very distant past and that time told he would eventually need surgery but this was never addressed. At last visit pt stopped statin medication to ensure this has not exacerbated his pain. Told to retun to [facility] for imaging. Started him on glipizide.  Today, he states he has stopped taking the glipizide. Still drinking soda but trying to cut back. Still having left shoulder pain after an injury, wanted an injection today. Regarding his shoulder, most movements give him pain.  Wt Readings from Last 3 Encounters:  07/14/15 195 lb (88.451  kg)  03/18/15 195 lb 6 oz (88.622 kg)  08/23/14 203 lb (92.08 kg)   Past Medical History  Diagnosis Date  . Hyperlipidemia   . Diabetes mellitus     toe amp 4/12  . Allergy   . Arthritis   . Hypertension    Current Outpatient Prescriptions on File Prior to Visit  Medication Sig Dispense Refill  . aspirin 81 MG tablet Take 81 mg by mouth daily.    Marland Kitchen atenolol (TENORMIN) 100 MG tablet Take 1 tablet (100 mg total) by mouth daily. 90 tablet 3  . carisoprodol (SOMA) 350 MG tablet TAKE ONE TABLET BY MOUTH FOUR TIMES DAILY AS NEEDED FOR MUSCLE SPASMS 120 tablet 5  . fentaNYL (DURAGESIC - DOSED MCG/HR) 75 MCG/HR Place 1 patch (75 mcg total) onto the skin every 3 (three) days. May fill 30 days after script. 10 patch 0  . fentaNYL (DURAGESIC) 75 MCG/HR Place 1 patch (75 mcg total) onto the skin every 3 (three) days. May fill 60 days after script. 10 patch 0  . fentaNYL (DURAGESIC) 75 MCG/HR Place 1 patch (75 mcg total) onto the skin every 3 (three) days. May fill 90 days after script. 10 patch 0  . fentaNYL (DURAGESIC) 75 MCG/HR Place 1 patch (75 mcg total) onto the skin every 3 (three) days. 10 patch 0  . fluticasone (FLONASE) 50 MCG/ACT nasal spray Place 2  sprays into the nose daily.    Marland Kitchen losartan (COZAAR) 100 MG tablet Take 1 tablet (100 mg total) by mouth daily. 90 tablet 3  . metFORMIN (GLUCOPHAGE) 1000 MG tablet Take 1 tablet (1,000 mg total) by mouth 2 (two) times daily with a meal. 180 tablet 3  . naproxen sodium (ANAPROX) 275 MG tablet Take 1 tablet (275 mg total) by mouth 2 (two) times daily with a meal. 60 tablet 5  . Oxycodone HCl 10 MG TABS Take 1 tablet (10 mg total) by mouth every 6 (six) hours as needed. May fill 90 days after script. 120 tablet 0  . Oxycodone HCl 10 MG TABS Take 1 tablet (10 mg total) by mouth every 6 (six) hours as needed (pain). May fill 30 days after script. 120 tablet 0  . Oxycodone HCl 10 MG TABS Take 1 tablet (10 mg total) by mouth every 6 (six) hours as  needed (pain). May fill 60 days after script 120 tablet 0  . Oxycodone HCl 10 MG TABS Take 1 tablet (10 mg total) by mouth every 6 (six) hours as needed (pain). 120 tablet 0  . pantoprazole (PROTONIX) 40 MG tablet Take 1 tablet (40 mg total) by mouth daily. 90 tablet 3  . glipiZIDE (GLUCOTROL) 5 MG tablet Take 1 tablet (5 mg total) by mouth daily before breakfast. And 1/2 tab with dinner (Patient not taking: Reported on 07/14/2015) 60 tablet 5  . lovastatin (MEVACOR) 20 MG tablet Take 1 tablet (20 mg total) by mouth at bedtime. (Patient not taking: Reported on 07/14/2015) 90 tablet 3   No current facility-administered medications on file prior to visit.   Allergies  Allergen Reactions  . Prozac [Fluoxetine Hcl]   . Amlodipine Rash   Review of Systems  Musculoskeletal: Positive for back pain and arthralgias.      Objective:  BP 130/80 mmHg  Pulse 60  Temp(Src) 98.3 F (36.8 C) (Oral)  Resp 16  Ht 6' (1.829 m)  Wt 195 lb (88.451 kg)  BMI 26.44 kg/m2  SpO2 95% Physical Exam  Constitutional: He is oriented to person, place, and time. He appears well-developed and well-nourished. No distress.  HENT:  Head: Normocephalic and atraumatic.  Eyes: Pupils are equal, round, and reactive to light.  Neck: Normal range of motion.  Cardiovascular: Normal rate and regular rhythm.   Pulmonary/Chest: Effort normal. No respiratory distress.  Musculoskeletal: Normal range of motion.  No bony defect over clavicle or chromium. Full ROM though it does induce pain. Deltoid, bicep, triceps 5/5 strength. supraspinatus with pain but good strength. Internal, external rotation 5/5 strength.   Neurological: He is alert and oriented to person, place, and time.  Skin: Skin is warm and dry.  Psychiatric: He has a normal mood and affect. His behavior is normal.  Nursing note and vitals reviewed.  Risks/benefits of intraarticular injection reviewed and verbal informed consent obtained.  Anesthesia w/ ethyl  chloride cold spray. Posterior approach to intraarticular shoulder joint cleaned well then injected with 40mg  of DepoMedrol and 4cc of 1% lidocaine using 22g 1 1/2in needle without complications. Pt tolerated procedure well. No EBL.     Assessment & Plan:   1. Type 2 diabetes mellitus with hyperglycemia - pt reminded to f/u w/ [facility] for labs and any additional shoulder imaging if pain cont  2. Arthropathy of shoulder region - distant h/o rotator cuff inj which he reports substantial improvement with cortisone inj so will repeat today.  3. Chronic pain syndrome -  meds refilled.  4. Debility   5. Chest wall pain, chronic   6. Costochondritis   7. Lumbar degenerative disc disease   8. Diabetic peripheral neuropathy associated with type 2 diabetes mellitus     Orders Placed This Encounter  Procedures  . POCT glucose (manual entry)  . POCT glycosylated hemoglobin (Hb A1C)    Meds ordered this encounter  Medications  . losartan (COZAAR) 100 MG tablet    Sig: Take 1 tablet (100 mg total) by mouth daily.    Dispense:  90 tablet    Refill:  3  . carisoprodol (SOMA) 350 MG tablet    Sig: TAKE ONE TABLET BY MOUTH FOUR TIMES DAILY AS NEEDED FOR MUSCLE SPASMS. May fill on or after 09/20/2015    Dispense:  120 tablet    Refill:  5  . fentaNYL (DURAGESIC - DOSED MCG/HR) 75 MCG/HR    Sig: Place 1 patch (75 mcg total) onto the skin every 3 (three) days. May fill 30 days after script.    Dispense:  10 patch    Refill:  0  . fentaNYL (DURAGESIC) 75 MCG/HR    Sig: Place 1 patch (75 mcg total) onto the skin every 3 (three) days. May fill 60 days after script.    Dispense:  10 patch    Refill:  0  . fentaNYL (DURAGESIC) 75 MCG/HR    Sig: Place 1 patch (75 mcg total) onto the skin every 3 (three) days. May fill 90 days after script.    Dispense:  10 patch    Refill:  0  . fentaNYL (DURAGESIC) 75 MCG/HR    Sig: Place 1 patch (75 mcg total) onto the skin every 3 (three) days.    Dispense:  10 patch     Refill:  0  . Oxycodone HCl 10 MG TABS    Sig: Take 1 tablet (10 mg total) by mouth every 6 (six) hours as needed. May fill 90 days after script.    Dispense:  120 tablet    Refill:  0  . Oxycodone HCl 10 MG TABS    Sig: Take 1 tablet (10 mg total) by mouth every 6 (six) hours as needed (pain). May fill 30 days after script.    Dispense:  120 tablet    Refill:  0  . Oxycodone HCl 10 MG TABS    Sig: Take 1 tablet (10 mg total) by mouth every 6 (six) hours as needed (pain). May fill 60 days after script    Dispense:  120 tablet    Refill:  0  . Oxycodone HCl 10 MG TABS    Sig: Take 1 tablet (10 mg total) by mouth every 6 (six) hours as needed (pain).    Dispense:  120 tablet    Refill:  0  . methylPREDNISolone acetate (DEPO-MEDROL) injection 40 mg    Sig:     I personally performed the services described in this documentation, which was scribed in my presence. The recorded information has been reviewed and considered, and addended by me as needed.  Robert Cheadle, MD MPH    By signing my name below, I, Nadim Abuhashem, attest that this documentation has been prepared under the direction and in the presence of Robert Cheadle, MD.  Electronically Signed: Lora Havens, medical scribe. 07/14/2015, 10:12 AM.

## 2015-07-14 NOTE — Patient Instructions (Signed)
Please make an appointment with [facility] it have been over a year since last visit since labs. Also please call to let me know before your 4 month appointment with me so we can touch base with that physician to get needed labs and imaging pre-arranged.Robert Burnett

## 2015-11-07 ENCOUNTER — Ambulatory Visit (INDEPENDENT_AMBULATORY_CARE_PROVIDER_SITE_OTHER): Payer: Self-pay | Admitting: Family Medicine

## 2015-11-07 VITALS — BP 148/82 | HR 53 | Temp 98.3°F | Resp 16 | Ht 71.5 in | Wt 201.0 lb

## 2015-11-07 DIAGNOSIS — M5136 Other intervertebral disc degeneration, lumbar region: Secondary | ICD-10-CM

## 2015-11-07 DIAGNOSIS — R5381 Other malaise: Secondary | ICD-10-CM

## 2015-11-07 DIAGNOSIS — M4714 Other spondylosis with myelopathy, thoracic region: Secondary | ICD-10-CM

## 2015-11-07 DIAGNOSIS — E1142 Type 2 diabetes mellitus with diabetic polyneuropathy: Secondary | ICD-10-CM

## 2015-11-07 DIAGNOSIS — G894 Chronic pain syndrome: Secondary | ICD-10-CM

## 2015-11-07 LAB — COMPREHENSIVE METABOLIC PANEL
ALBUMIN: 4.3 g/dL (ref 3.6–5.1)
ALK PHOS: 82 U/L (ref 40–115)
ALT: 52 U/L — AB (ref 9–46)
AST: 55 U/L — ABNORMAL HIGH (ref 10–35)
BILIRUBIN TOTAL: 0.9 mg/dL (ref 0.2–1.2)
BUN: 14 mg/dL (ref 7–25)
CHLORIDE: 100 mmol/L (ref 98–110)
CO2: 27 mmol/L (ref 20–31)
CREATININE: 0.89 mg/dL (ref 0.70–1.33)
Calcium: 9.5 mg/dL (ref 8.6–10.3)
Glucose, Bld: 187 mg/dL — ABNORMAL HIGH (ref 65–99)
Potassium: 4.7 mmol/L (ref 3.5–5.3)
SODIUM: 137 mmol/L (ref 135–146)
TOTAL PROTEIN: 7.2 g/dL (ref 6.1–8.1)

## 2015-11-07 LAB — POCT GLYCOSYLATED HEMOGLOBIN (HGB A1C): Hemoglobin A1C: 9.2

## 2015-11-07 LAB — GLUCOSE, POCT (MANUAL RESULT ENTRY): POC GLUCOSE: 178 mg/dL — AB (ref 70–99)

## 2015-11-07 MED ORDER — GLIPIZIDE 5 MG PO TABS: 5.0000 mg | ORAL_TABLET | Freq: Every day | ORAL | Status: DC

## 2015-11-07 MED ORDER — OXYCODONE HCL 10 MG PO TABS: 10.0000 mg | ORAL_TABLET | Freq: Four times a day (QID) | ORAL | Status: DC | PRN

## 2015-11-07 MED ORDER — FENTANYL 75 MCG/HR TD PT72: 75.0000 ug | MEDICATED_PATCH | TRANSDERMAL | Status: DC

## 2015-11-07 MED ORDER — GLIPIZIDE 10 MG PO TABS: 10.0000 mg | ORAL_TABLET | Freq: Every day | ORAL | Status: DC

## 2015-11-07 MED ORDER — OXYCODONE HCL 10 MG PO TABS: 10.0000 mg | ORAL_TABLET | ORAL | Status: DC | PRN

## 2015-11-07 MED ORDER — METFORMIN HCL 1000 MG PO TABS: 1000.0000 mg | ORAL_TABLET | Freq: Two times a day (BID) | ORAL | Status: DC

## 2015-11-07 NOTE — Patient Instructions (Addendum)
Complementary and Alternative Medical Therapies for Diabetes Complementary and alternative medicines are health care practices or products that are not always accepted as part of routine medicine. Complementary medicine is used along with routine medicine (medical therapy). Alternative medicine can sometimes be used instead of routine medicine. Some people use these methods to treat diabetes. While some of these therapies may be effective, others may not be. Some may even be harmful. Patients using these methods need to tell their caregiver. It is important to let your caregivers know what you are doing. Some of these therapies are discussed below. For more information, talk with your caregiver. THERAPIES Acupuncture Acupuncture is done by a professional who inserts needles into certain points on the skin. Some scientists believe that this triggers the release of the body's natural painkillers. It has been shown to relieve long-term (chronic) pain. This may help patients with painful nerve damage caused by diabetes. Biofeedback Biofeedback helps a person become more aware of the body's response to pain. It also helps you learn to deal with the pain. This alternative therapy focuses on relaxation and stress-reduction techniques. Thinking of peaceful mental images (guided imagery) is one technique. Some people believe these images can ease their condition. MEDICATIONS Chromium Several studies report that chromium supplements may improve diabetes control. Chromium helps insulin improve its action. Research is not yet certain. Supplements have not been recommended or approved. Caution is needed if you have kidney (renal) problems. Ginseng There are several types of ginseng plants. American ginseng is used for diabetes studies. Those studies have shown some glucose-lowering effects. Those effects have been seen with fasting and after-meal blood glucose levels. They have also been seen in A1c levels (average  blood glucose levels over a 22-month period). More long-term studies are needed before recommendations for use of ginseng can be made. Magnesium Experts have studied the relationship between magnesium and diabetes for many years. But it is not yet fully understood. Studies suggest that a low amount of magnesium may make blood glucose control worse in type 2 diabetes. Research also shows that a low amount may contribute to certain diabetes complications. One study showed that people who consume more magnesium had less risk of type 2 diabetes. Eating whole grains, nuts, and green leafy vegetables raises the magnesium level. Vanadium Vanadium is a compound found in tiny amounts in plants and animals. Early studies showed that vanadium improved blood glucose levels in animals with type 1 and type 2 diabetes. One study found that when given vanadium, those with diabetes were able to decrease their insulin dosage. Researchers still need to learn how it works in the body to discover any side effects, and to find safe dosages. Cinnamon There have been a couple of studies that seem to indicate cinnamon decreases insulin resistance and increases insulin production. By doing so, it may lower blood glucose. Exact doses are unknown, but it may work best when used in combination with other diabetes medicines.   This information is not intended to replace advice given to you by your health care provider. Make sure you discuss any questions you have with your health care provider.   Document Released: 08/03/2007 Document Revised: 12/29/2011 Document Reviewed: 08/16/2009 Elsevier Interactive Patient Education 2016 [employer].  Diabetes Mellitus and Food It is important for you to manage your blood sugar (glucose) level. Your blood glucose level can be greatly affected by what you eat. Eating healthier foods in the appropriate amounts throughout the day at about the same time each  day will help you control your blood  glucose level. It can also help slow or prevent worsening of your diabetes mellitus. Healthy eating may even help you improve the level of your blood pressure and reach or maintain a healthy weight.  General recommendations for healthful eating and cooking habits include:  Eating meals and snacks regularly. Avoid going long periods of time without eating to lose weight.  Eating a diet that consists mainly of plant-based foods, such as fruits, vegetables, nuts, legumes, and whole grains.  Using low-heat cooking methods, such as baking, instead of high-heat cooking methods, such as deep frying. Work with your dietitian to make sure you understand how to use the Nutrition Facts information on food labels. HOW CAN FOOD AFFECT ME? Carbohydrates Carbohydrates affect your blood glucose level more than any other type of food. Your dietitian will help you determine how many carbohydrates to eat at each meal and teach you how to count carbohydrates. Counting carbohydrates is important to keep your blood glucose at a healthy level, especially if you are using insulin or taking certain medicines for diabetes mellitus. Alcohol Alcohol can cause sudden decreases in blood glucose (hypoglycemia), especially if you use insulin or take certain medicines for diabetes mellitus. Hypoglycemia can be a life-threatening condition. Symptoms of hypoglycemia (sleepiness, dizziness, and disorientation) are similar to symptoms of having too much alcohol.  If your health care provider has given you approval to drink alcohol, do so in moderation and use the following guidelines:  Women should not have more than one drink per day, and men should not have more than two drinks per day. One drink is equal to:  12 oz of beer.  5 oz of wine.  1 oz of hard liquor.  Do not drink on an empty stomach.  Keep yourself hydrated. Have water, diet soda, or unsweetened iced tea.  Regular soda, juice, and other mixers might contain a  lot of carbohydrates and should be counted. WHAT FOODS ARE NOT RECOMMENDED? As you make food choices, it is important to remember that all foods are not the same. Some foods have fewer nutrients per serving than other foods, even though they might have the same number of calories or carbohydrates. It is difficult to get your body what it needs when you eat foods with fewer nutrients. Examples of foods that you should avoid that are high in calories and carbohydrates but low in nutrients include:  Trans fats (most processed foods list trans fats on the Nutrition Facts label).  Regular soda.  Juice.  Candy.  Sweets, such as cake, pie, doughnuts, and cookies.  Fried foods. WHAT FOODS CAN I EAT? Eat nutrient-rich foods, which will nourish your body and keep you healthy. The food you should eat also will depend on several factors, including:  The calories you need.  The medicines you take.  Your weight.  Your blood glucose level.  Your blood pressure level.  Your cholesterol level. You should eat a variety of foods, including:  Protein.  Lean cuts of meat.  Proteins low in saturated fats, such as fish, egg whites, and beans. Avoid processed meats.  Fruits and vegetables.  Fruits and vegetables that may help control blood glucose levels, such as apples, mangoes, and yams.  Dairy products.  Choose fat-free or low-fat dairy products, such as milk, yogurt, and cheese.  Grains, bread, pasta, and rice.  Choose whole grain products, such as multigrain bread, whole oats, and brown rice. These foods may help control blood  pressure.  Fats.  Foods containing healthful fats, such as nuts, avocado, olive oil, canola oil, and fish. DOES EVERYONE WITH DIABETES MELLITUS HAVE THE SAME MEAL PLAN? Because every person with diabetes mellitus is different, there is not one meal plan that works for everyone. It is very important that you meet with a dietitian who will help you create a meal  plan that is just right for you.   This information is not intended to replace advice given to you by your health care provider. Make sure you discuss any questions you have with your health care provider.   Document Released: 07/03/2005 Document Revised: 10/27/2014 Document Reviewed: 09/02/2013 Elsevier Interactive Patient Education [employer].

## 2015-11-07 NOTE — Progress Notes (Signed)
Subjective:  By signing my name below, I, Robert Burnett, attest that this documentation has been prepared under the direction and in the presence of [name], MD. Electronically Signed: [name], [place]. 11/07/2015 , 1:50 PM .  Patient was seen in Room 11 .   Patient ID: Robert Burnett, male    DOB: 10-28-1960, 55 y.o.   MRN: [id number] Chief Complaint  Patient presents with  . Medication Refill    Oxycodone, fentanyl patch   HPI Robert Burnett is a 55 y.o. male who presents to [facility] for medication refill on oxycodone and fentanyl patch.  Last seen in office a little over 3 months, his a1c 8.9 at that time, committed to work on diet and exercise. Also did right shoulder injection at that time.   Shoulder His shoulder is doing a lot better now after the injection.   Sugar He checked his sugar this morning, measuring at 55. His sugar has been pretty good. Whenever he feels like his sugar gets too low, he would drink some orange juice or eat a candy. He notes that this hasn't occurred in years. He informs that his sugar measuring highest around 180.   His feet still has tingling pain intermittently but it hasn't gotten worse.   BP He checks his BP regularly. Last night, he measured 130/83 BP.   Medication He states that the fentanyl patch doing well for him.  He reports not taking glipizide everyday because it makes him gain weight. He takes it right before breakfast.  He takes pain medication generally about 4 times a day. He occasionally tries to stay off the medication though.  He denies urinary symptoms and bowel symptoms.   Vision His vision is about the same without any spots.   Past Medical History  Diagnosis Date  . Hyperlipidemia   . Diabetes mellitus     toe amp 4/12  . Allergy   . Arthritis   . Hypertension    Prior to Admission medications   Medication Sig Start Date End Date Taking? Authorizing Provider  aspirin 81 MG tablet Take 81 mg by mouth  daily.   Yes Historical Provider, MD  atenolol (TENORMIN) 100 MG tablet Take 1 tablet (100 mg total) by mouth daily. 03/18/15  Yes [name], MD  carisoprodol (SOMA) 350 MG tablet TAKE ONE TABLET BY MOUTH FOUR TIMES DAILY AS NEEDED FOR MUSCLE SPASMS. May fill on or after 09/20/2015 07/14/15  Yes [name], MD  fentaNYL (DURAGESIC - DOSED MCG/HR) 75 MCG/HR Place 1 patch (75 mcg total) onto the skin every 3 (three) days. May fill 30 days after script. 07/14/15  Yes [name], MD  fluticasone (FLONASE) 50 MCG/ACT nasal spray Place 2 sprays into the nose daily.   Yes Historical Provider, MD  glipiZIDE (GLUCOTROL) 5 MG tablet Take 1 tablet (5 mg total) by mouth daily before breakfast. And 1/2 tab with dinner 03/18/15  Yes [name], MD  losartan (COZAAR) 100 MG tablet Take 1 tablet (100 mg total) by mouth daily. 07/14/15  Yes [name], MD  metFORMIN (GLUCOPHAGE) 1000 MG tablet Take 1 tablet (1,000 mg total) by mouth 2 (two) times daily with a meal. 08/23/14  Yes [name], MD  Oxycodone HCl 10 MG TABS Take 1 tablet (10 mg total) by mouth every 6 (six) hours as needed. May fill 90 days after script. 07/14/15  Yes [name], MD  pantoprazole (PROTONIX) 40 MG tablet  Take 1 tablet (40 mg total) by mouth daily. 03/18/15  Yes [name], MD  fentaNYL (DURAGESIC) 75 MCG/HR Place 1 patch (75 mcg total) onto the skin every 3 (three) days. May fill 60 days after script. Patient not taking: Reported on 11/07/2015 07/14/15   [name], MD  fentaNYL (DURAGESIC) 75 MCG/HR Place 1 patch (75 mcg total) onto the skin every 3 (three) days. May fill 90 days after script. Patient not taking: Reported on 11/07/2015 07/14/15   [name], MD  fentaNYL (DURAGESIC) 75 MCG/HR Place 1 patch (75 mcg total) onto the skin every 3 (three) days. Patient not taking: Reported on 11/07/2015 07/14/15   [name], MD  Oxycodone HCl 10 MG TABS Take 1 tablet (10 mg total) by mouth every 6 (six) hours as needed (pain). May fill 30 days after  script. Patient not taking: Reported on 11/07/2015 07/14/15   [name], MD  Oxycodone HCl 10 MG TABS Take 1 tablet (10 mg total) by mouth every 6 (six) hours as needed (pain). May fill 60 days after script Patient not taking: Reported on 11/07/2015 07/14/15   [name], MD  Oxycodone HCl 10 MG TABS Take 1 tablet (10 mg total) by mouth every 6 (six) hours as needed (pain). Patient not taking: Reported on 11/07/2015 07/14/15   [name], MD   Allergies  Allergen Reactions  . Prozac [Fluoxetine Hcl]   . Amlodipine Rash    Review of Systems  Constitutional: Negative for fever.  Eyes: Negative.  Negative for visual disturbance.  Gastrointestinal: Negative for abdominal pain, diarrhea and constipation.  Genitourinary: Negative for dysuria, urgency, frequency and hematuria.  Musculoskeletal: Positive for myalgias (tingling in feet). Negative for arthralgias and gait problem.  Neurological: Negative for dizziness, light-headedness and headaches.       Objective:   Physical Exam  Constitutional: He is oriented to person, place, and time. He appears well-developed and well-nourished. No distress.  HENT:  Head: Normocephalic and atraumatic.  Eyes: EOM are normal. Pupils are equal, round, and reactive to light.  Neck: Neck supple.  Cardiovascular: Normal rate.   Pulmonary/Chest: Effort normal. No respiratory distress.  Musculoskeletal: Normal range of motion.  Neurological: He is alert and oriented to person, place, and time.  Skin: Skin is warm and dry.  Psychiatric: He has a normal mood and affect. His behavior is normal.  Nursing note and vitals reviewed.   BP 148/82 mmHg  Pulse 53  Temp(Src) 98.3 F (36.8 C) (Oral)  Resp 16  Ht 5' 11.5" (1.816 m)  Wt 201 lb (91.173 kg)  BMI 27.65 kg/m2  SpO2 98%     Results for orders placed or performed in visit on 11/07/15  Comprehensive metabolic panel  Result Value Ref Range   Sodium 137 135 - 146 mmol/L   Potassium 4.7 3.5 - 5.3  mmol/L   Chloride 100 98 - 110 mmol/L   CO2 27 20 - 31 mmol/L   Glucose, Bld 187 (H) 65 - 99 mg/dL   BUN 14 7 - 25 mg/dL   Creat 0.89 0.70 - 1.33 mg/dL   Total Bilirubin 0.9 0.2 - 1.2 mg/dL   Alkaline Phosphatase 82 40 - 115 U/L   AST 55 (H) 10 - 35 U/L   ALT 52 (H) 9 - 46 U/L   Total Protein 7.2 6.1 - 8.1 g/dL   Albumin 4.3 3.6 - 5.1 g/dL   Calcium 9.5 8.6 - 10.3 mg/dL  POCT glycosylated hemoglobin (Hb A1C)  Result Value Ref Range   Hemoglobin A1C 9.2   POCT glucose (manual entry)  Result Value Ref Range   POC Glucose 178 (A) 70 - 99 mg/dl    Assessment & Plan:   1. Type 2 diabetes mellitus with peripheral neuropathy ([place]) - pt has been working some on his diet so disappointed a1c.has increased from 8.9 -> 9.2.  Increase glipizide, cont metformin.   2. Thoracic spondylosis with myelopathy   3. Lumbar degenerative disc disease   4. Chronic pain syndrome  - meds refilled  5. Debility     Orders Placed This Encounter  Procedures  . Comprehensive metabolic panel  . POCT glycosylated hemoglobin (Hb A1C)  . POCT glucose (manual entry)    Meds ordered this encounter.    Medications  . fentaNYL (DURAGESIC - DOSED MCG/HR) 75 MCG/HR    Sig: Place 1 patch (75 mcg total) onto the skin every 3 (three) days. May fill 30 days after script.    Dispense:  10 patch    Refill:  0  . fentaNYL (DURAGESIC) 75 MCG/HR    Sig: Place 1 patch (75 mcg total) onto the skin every 3 (three) days. May fill 60 days after script.    Dispense:  10 patch    Refill:  0  . fentaNYL (DURAGESIC) 75 MCG/HR    Sig: Place 1 patch (75 mcg total) onto the skin every 3 (three) days. May fill 90 days after script.    Dispense:  10 patch    Refill:  0  . fentaNYL (DURAGESIC) 75 MCG/HR    Sig: Place 1 patch (75 mcg total) onto the skin every 3 (three) days.    Dispense:  10 patch    Refill:  0  . DISCONTD: glipiZIDE (GLUCOTROL) 5 MG tablet    Sig: Take 1 tablet (5 mg total) by mouth daily before breakfast.  And 1/2 tab with dinner    Dispense:  180 tablet    Refill:  1  . metFORMIN (GLUCOPHAGE) 1000 MG tablet    Sig: Take 1 tablet (1,000 mg total) by mouth 2 (two) times daily with a meal.    Dispense:  180 tablet    Refill:  3  . Oxycodone HCl 10 MG TABS    Sig: Take 1 tablet (10 mg total) by mouth every 6 (six) hours as needed. May fill 90 days after script.    Dispense:  150 tablet    Refill:  0  . Oxycodone HCl 10 MG TABS    Sig: Take 1 tablet (10 mg total) by mouth every 6 (six) hours as needed (pain). May fill 30 days after script.    Dispense:  150 tablet    Refill:  0  . Oxycodone HCl 10 MG TABS    Sig: Take 1 tablet (10 mg total) by mouth every 6 (six) hours as needed (pain). May fill 60 days after script    Dispense:  150 tablet    Refill:  0  . Oxycodone HCl 10 MG TABS    Sig: Take 1 tablet (10 mg total) by mouth every 4 (four) hours as needed (pain).    Dispense:  150 tablet    Refill:  0  . glipiZIDE (GLUCOTROL) 10 MG tablet    Sig: Take 1 tablet (10 mg total) by mouth daily before breakfast. And 1/2 tab with dinner    Dispense:  180 tablet    Refill:  1  I personally performed the services described in this documentation, which was scribed in my presence. The recorded information has been reviewed and considered, and addended by me as needed.  [name], MD MPH

## 2016-03-03 ENCOUNTER — Ambulatory Visit (INDEPENDENT_AMBULATORY_CARE_PROVIDER_SITE_OTHER): Payer: Self-pay | Admitting: Family Medicine

## 2016-03-03 VITALS — BP 128/80 | HR 62 | Temp 98.7°F | Resp 16 | Ht 71.5 in | Wt 204.0 lb

## 2016-03-03 DIAGNOSIS — G894 Chronic pain syndrome: Secondary | ICD-10-CM

## 2016-03-03 MED ORDER — OXYCODONE HCL 10 MG PO TABS: 10.0000 mg | ORAL_TABLET | Freq: Four times a day (QID) | ORAL | Status: DC | PRN

## 2016-03-03 MED ORDER — CARISOPRODOL 350 MG PO TABS: ORAL_TABLET | ORAL | Status: DC

## 2016-03-03 MED ORDER — FENTANYL 75 MCG/HR TD PT72: 75.0000 ug | MEDICATED_PATCH | TRANSDERMAL | Status: DC

## 2016-03-03 MED ORDER — OXYCODONE HCL 10 MG PO TABS: 10.0000 mg | ORAL_TABLET | ORAL | Status: DC | PRN

## 2016-03-03 NOTE — Patient Instructions (Signed)
     IF you received an x-ray today, you will receive an invoice from [facility]. Please contact [facility] at [phone] with questions or concerns regarding your invoice.   IF you received labwork today, you will receive an invoice from [facility]. Please contact [facility] at [phone] with questions or concerns regarding your invoice.   Our billing staff will not be able to assist you with questions regarding bills from these companies.  You will be contacted with the lab results as soon as they are available. The fastest way to get your results is to activate your My Chart account. Instructions are located on the last page of this paperwork. If you have not heard from us regarding the results in 2 weeks, please contact this office.      

## 2016-03-03 NOTE — Progress Notes (Signed)
By signing my name below, I, [name], attest that this documentation has been prepared under the direction and in the presence of [name], MD.  Electronically Signed: [name], Medical Scribe. 03/03/2016. 2:36 PM.  Subjective:    Patient ID: Robert Burnett, male    DOB: [birth year], 55 y.o.   MRN: [id number]  HPI Chief Complaint  Patient presents with  . Medication Refill    oxycodone, fentanyl patch   HPI Comments: Robert Burnett is a 55 y.o. male who has PMHx of DM2 presents to the [facility] for a medication refill. A1c 2014-2015 ranged from 6 up to 10.8 2016, and 9.2 4 months prior. Pt resolved to decrased soda intake. Diabetic neuropathy unchanged so we inc his liposide from 5 QAM and 2.5 w/dinner to 10 QAI w/dinner and continued metformin. Pts reports his sugars was 122 this morning, and had number of times when he was hypoglycemic. Pt reports taking half of his medication in the morning and the other half at night when his sugars are high. He mentions his lows have mainly been in the morning. Pt reports the lowest his sugars have been is around 60 and it "hits really fast". He reports when his sugars are low he starts to shake, gets confused, irritable, and his lower face gets itchy. Pt has been going some days without his medications because of his tight finances so his sugars ave been running all over the place.  Pt mentions his spring has been going well with some seasonal rhinorrhea.  Pt reports his pain has been getting better.   Depression screen [facility] 2/9 03/03/2016 11/07/2015 07/14/2015 03/18/2015 06/01/2014  Decreased Interest 0 0 0 2 1  Down, Depressed, Hopeless 0 0 0 2 1  PHQ - 2 Score 0 0 0 4 2  Altered sleeping - - - 0 0  Tired, decreased energy - - - 3 1  Change in appetite - - - 2 0  Feeling bad or failure about yourself  - - - 1 0  Trouble concentrating - - - 0 1  Moving slowly or fidgety/restless - - - 0 0  Suicidal thoughts - - - 0  0  PHQ-9 Score - - - 10 4   Patient Active Problem List   Diagnosis Date Noted  . Thoracic spondylosis with myelopathy 03/16/2013  . Carpal tunnel syndrome 03/16/2013  . Chest wall pain, chronic 03/16/2013  . Type 2 diabetes mellitus with peripheral neuropathy ([place]) 03/16/2013  . Debility 03/16/2013  . Chronic pain syndrome 03/16/2013  . Lumbar degenerative disc disease 12/12/2011  . Costochondritis 12/12/2011  . Diabetic peripheral neuropathy associated with type 2 diabetes mellitus ([place]) 12/12/2011   Past Medical History  Diagnosis Date  . Hyperlipidemia   . Diabetes mellitus     toe amp 4/12  . Allergy   . Arthritis   . Hypertension    Past Surgical History  Procedure Laterality Date  . Knee surgery  1975 and 1989   Allergies  Allergen Reactions  . Prozac [Fluoxetine Hcl]   . Amlodipine Rash   Prior to Admission medications   Medication Sig Start Date End Date Taking? Authorizing Provider  aspirin 81 MG tablet Take 81 mg by mouth daily.   Yes Historical Provider, MD  atenolol (TENORMIN) 100 MG tablet Take 1 tablet (100 mg total) by mouth daily. 03/18/15  Yes [name], MD  carisoprodol (SOMA) 350 MG tablet TAKE ONE TABLET BY MOUTH  FOUR TIMES DAILY AS NEEDED FOR MUSCLE SPASMS. May fill on or after 09/20/2015 07/14/15  Yes [name], MD  fentaNYL (DURAGESIC - DOSED MCG/HR) 75 MCG/HR Place 1 patch (75 mcg total) onto the skin every 3 (three) days. May fill 30 days after script. 11/07/15  Yes [name], MD  fentaNYL (DURAGESIC) 75 MCG/HR Place 1 patch (75 mcg total) onto the skin every 3 (three) days. May fill 60 days after script. 11/07/15  Yes [name], MD  fentaNYL (DURAGESIC) 75 MCG/HR Place 1 patch (75 mcg total) onto the skin every 3 (three) days. May fill 90 days after script. 11/07/15  Yes [name], MD  fentaNYL (DURAGESIC) 75 MCG/HR Place 1 patch (75 mcg total) onto the skin every 3 (three) days. 11/07/15  Yes [name], MD  fluticasone (FLONASE) 50 MCG/ACT nasal  spray Place 2 sprays into the nose daily.   Yes Historical Provider, MD  glipiZIDE (GLUCOTROL) 10 MG tablet Take 1 tablet (10 mg total) by mouth daily before breakfast. And 1/2 tab with dinner 11/07/15  Yes [name], MD  losartan (COZAAR) 100 MG tablet Take 1 tablet (100 mg total) by mouth daily. 07/14/15  Yes [name], MD  metFORMIN (GLUCOPHAGE) 1000 MG tablet Take 1 tablet (1,000 mg total) by mouth 2 (two) times daily with a meal. 11/07/15  Yes [name], MD  Oxycodone HCl 10 MG TABS Take 1 tablet (10 mg total) by mouth every 6 (six) hours as needed. May fill 90 days after script. 11/07/15  Yes [name], MD  Oxycodone HCl 10 MG TABS Take 1 tablet (10 mg total) by mouth every 6 (six) hours as needed (pain). May fill 30 days after script. 11/07/15  Yes [name], MD  Oxycodone HCl 10 MG TABS Take 1 tablet (10 mg total) by mouth every 6 (six) hours as needed (pain). May fill 60 days after script 11/07/15  Yes [name], MD  Oxycodone HCl 10 MG TABS Take 1 tablet (10 mg total) by mouth every 4 (four) hours as needed (pain). 11/07/15  Yes [name], MD  pantoprazole (PROTONIX) 40 MG tablet Take 1 tablet (40 mg total) by mouth daily. 03/18/15  Yes [name], MD   Social History   Social History  . Marital Status: Single    Spouse Name: N/A  . Number of Children: N/A  . Years of Education: N/A   Occupational History  . Not on file.   Social History Main Topics  . Smoking status: Former Smoker -- 2.00 packs/day for 5 years    Types: Cigarettes    Quit date: 10/20/1998  . Smokeless tobacco: Former Systems developer     Comment: used chewing tobacco for about a year at age 61  . Alcohol Use: No  . Drug Use: No  . Sexual Activity: Not on file   Other Topics Concern  . Not on file   Social History Narrative   Review of Systems  HENT: Positive for rhinorrhea (because of seasonal allergies).   Eyes:       No vision changes  Allergic/Immunologic: Positive for environmental allergies.  Neurological:  Negative for weakness and numbness.   Objective:   Physical Exam  Constitutional: He appears well-developed and well-nourished. No distress.  HENT:  Head: Normocephalic and atraumatic.  Eyes: Conjunctivae are normal.  Neck: Neck supple.  Cardiovascular: Normal rate, regular rhythm, S1 normal, S2 normal and normal heart  sounds.  Exam reveals no gallop and no friction rub.   No murmur heard. Pulmonary/Chest: Effort normal and breath sounds normal. No respiratory distress. He has no wheezes. He has no rales.  Lungs clear  Neurological: He is alert.  Skin: Skin is warm and dry.  Psychiatric: He has a normal mood and affect. His behavior is normal.  Nursing note and vitals reviewed.  BP 128/80 mmHg  Pulse 62  Temp(Src) 98.7 F (37.1 C) (Oral)  Resp 16  Ht 5' 11.5" (1.816 m)  Wt 204 lb (92.534 kg)  BMI 28.06 kg/m2  SpO2 97%  Assessment & Plan:   1. Chronic pain syndrome      Meds ordered this encounter  Medications  . Oxycodone HCl 10 MG TABS    Sig: Take 1 tablet (10 mg total) by mouth every 6 (six) hours as needed. May fill 90 days after script.    Dispense:  150 tablet    Refill:  0  . Oxycodone HCl 10 MG TABS    Sig: Take 1 tablet (10 mg total) by mouth every 6 (six) hours as needed (pain). May fill 30 days after script.    Dispense:  150 tablet    Refill:  0  . Oxycodone HCl 10 MG TABS    Sig: Take 1 tablet (10 mg total) by mouth every 6 (six) hours as needed (pain). May fill 60 days after script    Dispense:  150 tablet    Refill:  0  . Oxycodone HCl 10 MG TABS    Sig: Take 1 tablet (10 mg total) by mouth every 4 (four) hours as needed (pain).    Dispense:  150 tablet    Refill:  0  . fentaNYL (DURAGESIC - DOSED MCG/HR) 75 MCG/HR    Sig: Place 1 patch (75 mcg total) onto the skin every 3 (three) days. May fill 30 days after script.    Dispense:  10 patch    Refill:  0  . fentaNYL (DURAGESIC) 75 MCG/HR    Sig: Place 1 patch (75 mcg total) onto the skin every 3  (three) days. May fill 60 days after script.    Dispense:  10 patch    Refill:  0  . fentaNYL (DURAGESIC) 75 MCG/HR    Sig: Place 1 patch (75 mcg total) onto the skin every 3 (three) days. May fill 90 days after script.    Dispense:  10 patch    Refill:  0  . fentaNYL (DURAGESIC) 75 MCG/HR    Sig: Place 1 patch (75 mcg total) onto the skin every 3 (three) days.    Dispense:  10 patch    Refill:  0  . carisoprodol (SOMA) 350 MG tablet    Sig: TAKE ONE TABLET BY MOUTH FOUR TIMES DAILY AS NEEDED FOR MUSCLE SPASMS.    Dispense:  120 tablet    Refill:  5    I personally performed the services described in this documentation, which was scribed in my presence. The recorded information has been reviewed and considered, and addended by me as needed.   [name], M.D.  [facility] [address] [place], [place] [address] [phone] phone [phone] fax  03/19/2016 5:34 PM

## 2016-03-31 ENCOUNTER — Telehealth: Payer: Self-pay

## 2016-03-31 NOTE — Telephone Encounter (Signed)
SHAW - Pt's pharmacy called and said his original script of oxycodone was to be taken every 4 hours.  The new script says every 6 hours.  What is correct?  Please call  [phone]

## 2016-04-01 NOTE — Telephone Encounter (Signed)
Every 4 hours

## 2016-04-01 NOTE — Telephone Encounter (Signed)
Pharmacist advised

## 2016-04-24 ENCOUNTER — Other Ambulatory Visit: Payer: Self-pay | Admitting: Family Medicine

## 2016-06-27 ENCOUNTER — Ambulatory Visit (INDEPENDENT_AMBULATORY_CARE_PROVIDER_SITE_OTHER): Payer: Self-pay | Admitting: Family Medicine

## 2016-06-27 VITALS — BP 142/100 | HR 57 | Temp 98.8°F | Resp 15 | Ht 71.5 in | Wt 198.8 lb

## 2016-06-27 DIAGNOSIS — G8929 Other chronic pain: Secondary | ICD-10-CM

## 2016-06-27 DIAGNOSIS — G894 Chronic pain syndrome: Secondary | ICD-10-CM

## 2016-06-27 DIAGNOSIS — R5381 Other malaise: Secondary | ICD-10-CM

## 2016-06-27 DIAGNOSIS — I10 Essential (primary) hypertension: Secondary | ICD-10-CM

## 2016-06-27 DIAGNOSIS — R0789 Other chest pain: Secondary | ICD-10-CM

## 2016-06-27 DIAGNOSIS — E1142 Type 2 diabetes mellitus with diabetic polyneuropathy: Secondary | ICD-10-CM

## 2016-06-27 MED ORDER — FENTANYL 75 MCG/HR TD PT72: 75.0000 ug | MEDICATED_PATCH | TRANSDERMAL | 0 refills | Status: DC

## 2016-06-27 MED ORDER — LOSARTAN POTASSIUM 100 MG PO TABS: 100.0000 mg | ORAL_TABLET | Freq: Every day | ORAL | 3 refills | Status: DC

## 2016-06-27 MED ORDER — OXYCODONE HCL 10 MG PO TABS: 10.0000 mg | ORAL_TABLET | ORAL | 0 refills | Status: DC | PRN

## 2016-06-27 MED ORDER — ATENOLOL 100 MG PO TABS: 100.0000 mg | ORAL_TABLET | Freq: Every day | ORAL | 3 refills | Status: DC

## 2016-06-27 MED ORDER — OXYCODONE HCL 10 MG PO TABS: 10.0000 mg | ORAL_TABLET | Freq: Four times a day (QID) | ORAL | 0 refills | Status: DC | PRN

## 2016-06-27 NOTE — Patient Instructions (Addendum)
We will check your hemoglobin a1c, complete metabolic panel (kidneys and liver), and cholesterol at your next visit.   IF you received an x-ray today, you will receive an invoice from [facility]. Please contact [facility] at [phone] with questions or concerns regarding your invoice.   IF you received labwork today, you will receive an invoice from [employer]. Please contact [facility] at [phone] with questions or concerns regarding your invoice.   Our billing staff will not be able to assist you with questions regarding bills from these companies.  You will be contacted with the lab results as soon as they are available. The fastest way to get your results is to activate your My Chart account. Instructions are located on the last page of this paperwork. If you have not heard from [place] regarding the results in 2 weeks, please contact this office.    Blood Glucose Monitoring, Adult Monitoring your blood glucose (also know as blood sugar) helps you to manage your diabetes. It also helps you and your health care provider monitor your diabetes and determine how well your treatment plan is working. WHY SHOULD YOU MONITOR YOUR BLOOD GLUCOSE?  It can help you understand how food, exercise, and medicine affect your blood glucose.  It allows you to know what your blood glucose is at any given moment. You can quickly tell if you are having low blood glucose (hypoglycemia) or high blood glucose (hyperglycemia).  It can help you and your health care provider know how to adjust your medicines.  It can help you understand how to manage an illness or adjust medicine for exercise. WHEN SHOULD YOU TEST? Your health care provider will help you decide how often you should check your blood glucose. This may depend on the type of diabetes you have, your diabetes control, or the types of medicines you are taking. Be sure to write down all of your blood glucose  readings so that this information can be reviewed with your health care provider. See below for examples of testing times that your health care provider may suggest. Type 1 Diabetes  Test at least 2 times per day if your diabetes is well controlled, if you are using an insulin pump, or if you perform multiple daily injections.  If your diabetes is not well controlled or if you are sick, you may need to test more often.  It is a good idea to also test:  Before every insulin injection.  Before and after exercise.  Between meals and 2 hours after a meal.  Occasionally between 2:00 a.m. and 3:00 a.m. Type 2 Diabetes  If you are taking insulin, test at least 2 times per day. However, it is best to test before every insulin injection.  If you take medicines by mouth (orally), test 2 times a day.  If you are on a controlled diet, test once a day.  If your diabetes is not well controlled or if you are sick, you may need to monitor more often. HOW TO MONITOR YOUR BLOOD GLUCOSE Supplies Needed  Blood glucose meter.  Test strips for your meter. Each meter has its own strips. You must use the strips that go with your own meter.  A pricking needle (lancet).  A device that holds the lancet (lancing device).  A journal or log book to write down your results. Procedure  Wash your hands with soap and water. Alcohol is not preferred.  Prick the side of your finger (not the tip)  with the lancet.  Gently milk the finger until a small drop of blood appears.  Follow the instructions that come with your meter for inserting the test strip, applying blood to the strip, and using your blood glucose meter. Other Areas to Get Blood for Testing Some meters allow you to use other areas of your body (other than your finger) to test your blood. These areas are called alternative sites. The most common alternative sites are:  The forearm.  The thigh.  The back area of the lower leg.  The palm  of the hand. The blood flow in these areas is slower. Therefore, the blood glucose values you get may be delayed, and the numbers are different from what you would get from your fingers. Do not use alternative sites if you think you are having hypoglycemia. Your reading will not be accurate. Always use a finger if you are having hypoglycemia. Also, if you cannot feel your lows (hypoglycemia unawareness), always use your fingers for your blood glucose checks. ADDITIONAL TIPS FOR GLUCOSE MONITORING  Do not reuse lancets.  Always carry your supplies with you.  All blood glucose meters have a 24-hour "hotline" number to call if you have questions or need help.  Adjust (calibrate) your blood glucose meter with a control solution after finishing a few boxes of strips. BLOOD GLUCOSE RECORD KEEPING It is a good idea to keep a daily record or log of your blood glucose readings. Most glucose meters, if not all, keep your glucose records stored in the meter. Some meters come with the ability to download your records to your home computer. Keeping a record of your blood glucose readings is especially helpful if you are wanting to look for patterns. Make notes to go along with the blood glucose readings because you might forget what happened at that exact time. Keeping good records helps you and your health care provider to work together to achieve good diabetes management.    This information is not intended to replace advice given to you by your health care provider. Make sure you discuss any questions you have with your health care provider.   Document Released: 10/09/2003 Document Revised: 10/27/2014 Document Reviewed: 02/28/2013 Elsevier Interactive Patient Education 2016 [employer].  Diabetes Mellitus and Food It is important for you to manage your blood sugar (glucose) level. Your blood glucose level can be greatly affected by what you eat. Eating healthier foods in the appropriate amounts  throughout the day at about the same time each day will help you control your blood glucose level. It can also help slow or prevent worsening of your diabetes mellitus. Healthy eating may even help you improve the level of your blood pressure and reach or maintain a healthy weight.  General recommendations for healthful eating and cooking habits include:  Eating meals and snacks regularly. Avoid going long periods of time without eating to lose weight.  Eating a diet that consists mainly of plant-based foods, such as fruits, vegetables, nuts, legumes, and whole grains.  Using low-heat cooking methods, such as baking, instead of high-heat cooking methods, such as deep frying. Work with your dietitian to make sure you understand how to use the Nutrition Facts information on food labels. HOW CAN FOOD AFFECT ME? Carbohydrates Carbohydrates affect your blood glucose level more than any other type of food. Your dietitian will help you determine how many carbohydrates to eat at each meal and teach you how to count carbohydrates. Counting carbohydrates is important to keep  your blood glucose at a healthy level, especially if you are using insulin or taking certain medicines for diabetes mellitus. Alcohol Alcohol can cause sudden decreases in blood glucose (hypoglycemia), especially if you use insulin or take certain medicines for diabetes mellitus. Hypoglycemia can be a life-threatening condition. Symptoms of hypoglycemia (sleepiness, dizziness, and disorientation) are similar to symptoms of having too much alcohol.  If your health care provider has given you approval to drink alcohol, do so in moderation and use the following guidelines:  Women should not have more than one drink per day, and men should not have more than two drinks per day. One drink is equal to:  12 oz of beer.  5 oz of wine.  1 oz of hard liquor.  Do not drink on an empty stomach.  Keep yourself hydrated. Have water, diet  soda, or unsweetened iced tea.  Regular soda, juice, and other mixers might contain a lot of carbohydrates and should be counted. WHAT FOODS ARE NOT RECOMMENDED? As you make food choices, it is important to remember that all foods are not the same. Some foods have fewer nutrients per serving than other foods, even though they might have the same number of calories or carbohydrates. It is difficult to get your body what it needs when you eat foods with fewer nutrients. Examples of foods that you should avoid that are high in calories and carbohydrates but low in nutrients include:  Trans fats (most processed foods list trans fats on the Nutrition Facts label).  Regular soda.  Juice.  Candy.  Sweets, such as cake, pie, doughnuts, and cookies.  Fried foods. WHAT FOODS CAN I EAT? Eat nutrient-rich foods, which will nourish your body and keep you healthy. The food you should eat also will depend on several factors, including:  The calories you need.  The medicines you take.  Your weight.  Your blood glucose level.  Your blood pressure level.  Your cholesterol level. You should eat a variety of foods, including:  Protein.  Lean cuts of meat.  Proteins low in saturated fats, such as fish, egg whites, and beans. Avoid processed meats.  Fruits and vegetables.  Fruits and vegetables that may help control blood glucose levels, such as apples, mangoes, and yams.  Dairy products.  Choose fat-free or low-fat dairy products, such as milk, yogurt, and cheese.  Grains, bread, pasta, and rice.  Choose whole grain products, such as multigrain bread, whole oats, and brown rice. These foods may help control blood pressure.  Fats.  Foods containing healthful fats, such as nuts, avocado, olive oil, canola oil, and fish. DOES EVERYONE WITH DIABETES MELLITUS HAVE THE SAME MEAL PLAN? Because every person with diabetes mellitus is different, there is not one meal plan that works for  everyone. It is very important that you meet with a dietitian who will help you create a meal plan that is just right for you.   This information is not intended to replace advice given to you by your health care provider. Make sure you discuss any questions you have with your health care provider.   Document Released: 07/03/2005 Document Revised: 10/27/2014 Document Reviewed: 09/02/2013 Elsevier Interactive Patient Education [employer].

## 2016-06-27 NOTE — Progress Notes (Signed)
By signing my name below, I, [name], attest that this documentation has been prepared under the direction and in the presence of [name], MD.  Electronically Signed: [name], Medical Scribe. 06/27/16. 5:20 PM.  Subjective:    Patient ID: Robert Burnett, male    DOB: 23-Oct-1960, 55 y.o.   MRN: [id number]  HPI Chief Complaint  Patient presents with  . Medication Refill    Oxycodone and Fentanyl   . Immunizations    patient declined flu shot today     HPI Comments: Robert Burnett is a 55 y.o. male who presents to the [facility] for medication refill. Pt has had a glass of coke and water for the whole day. Pt would ike tog et blood work done another day due to financial concerns.  HTN: Takes Losartan and Atenolol for his bp. Pt has his bp medication but isn't sure if he took it this morning. Pt didn't take another pill so he wouldn't "over do it".  T2DM: Takes Metformin 1000 mg. Pt states his blood sugars have been doing better this past month with it running 126 this morning.  Pain: Helen Hashimoto for his pain and reports it gets some of his pain but not all of it.  GERD: Pt takes Protonix every 3-4 days, but mainly 4 days in between usage.  Allergies: Pt has had to use Flonase about a month ago.  Pt denies vision changes, changes in his feet, abdominal pain, irregular bowel movements, or trouble urinating.  Patient Active Problem List   Diagnosis Date Noted  . Hypertension 06/27/2016  . Thoracic spondylosis with myelopathy 03/16/2013  . Carpal tunnel syndrome 03/16/2013  . Chest wall pain, chronic 03/16/2013  . Type 2 diabetes mellitus with peripheral neuropathy ([place]) 03/16/2013  . Debility 03/16/2013  . Chronic pain syndrome 03/16/2013  . Lumbar degenerative disc disease 12/12/2011  . Costochondritis 12/12/2011  . Diabetic peripheral neuropathy associated with type 2 diabetes mellitus ([place]) 12/12/2011   Past Medical History:  Diagnosis  Date  . Allergy   . Arthritis   . Diabetes mellitus    toe amp 4/12  . Hyperlipidemia   . Hypertension    Past Surgical History:  Procedure Laterality Date  . KNEE SURGERY  1975 and 1989   Allergies  Allergen Reactions  . Prozac [Fluoxetine Hcl]   . Amlodipine Rash   Prior to Admission medications   Medication Sig Start Date End Date Taking? Authorizing Provider  aspirin 81 MG tablet Take 81 mg by mouth daily.   Yes Historical Provider, MD  atenolol (TENORMIN) 100 MG tablet Take 1 tablet (100 mg total) by mouth daily. 03/18/15  Yes [name], MD  carisoprodol (SOMA) 350 MG tablet TAKE ONE TABLET BY MOUTH FOUR TIMES DAILY AS NEEDED FOR MUSCLE SPASMS. 03/03/16  Yes [name], MD  fentaNYL (DURAGESIC - DOSED MCG/HR) 75 MCG/HR Place 1 patch (75 mcg total) onto the skin every 3 (three) days. May fill 30 days after script. 03/03/16  Yes [name], MD  fentaNYL (DURAGESIC) 75 MCG/HR Place 1 patch (75 mcg total) onto the skin every 3 (three) days. May fill 60 days after script. 03/03/16  Yes [name], MD  fentaNYL (DURAGESIC) 75 MCG/HR Place 1 patch (75 mcg total) onto the skin every 3 (three) days. May fill 90 days after script. 03/03/16  Yes [name], MD  fentaNYL (DURAGESIC) 75 MCG/HR Place 1 patch (75 mcg total)  onto the skin every 3 (three) days. 03/03/16  Yes [name], MD  fluticasone (FLONASE) 50 MCG/ACT nasal spray Place 2 sprays into the nose daily.   Yes Historical Provider, MD  glipiZIDE (GLUCOTROL) 10 MG tablet Take 1 tablet (10 mg total) by mouth daily before breakfast. And 1/2 tab with dinner 11/07/15  Yes [name], MD  losartan (COZAAR) 100 MG tablet Take 1 tablet (100 mg total) by mouth daily. 07/14/15  Yes [name], MD  metFORMIN (GLUCOPHAGE) 1000 MG tablet Take 1 tablet (1,000 mg total) by mouth 2 (two) times daily with a meal. 11/07/15  Yes [name], MD  Oxycodone HCl 10 MG TABS Take 1 tablet (10 mg total) by mouth every 6 (six) hours as needed. May fill 90 days after  script. 03/03/16  Yes [name], MD  Oxycodone HCl 10 MG TABS Take 1 tablet (10 mg total) by mouth every 6 (six) hours as needed (pain). May fill 30 days after script. 03/03/16  Yes [name], MD  Oxycodone HCl 10 MG TABS Take 1 tablet (10 mg total) by mouth every 6 (six) hours as needed (pain). May fill 60 days after script 03/03/16  Yes [name], MD  Oxycodone HCl 10 MG TABS Take 1 tablet (10 mg total) by mouth every 4 (four) hours as needed (pain). 03/03/16  Yes [name], MD  pantoprazole (PROTONIX) 40 MG tablet TAKE 1 TABLET BY MOUTH ONCE A DAY 04/25/16  Yes [name], MD   Social History   Social History  . Marital status: Single    Spouse name: N/A  . Number of children: N/A  . Years of education: N/A   Occupational History  . Not on file.   Social History Main Topics  . Smoking status: Former Smoker    Packs/day: 2.00    Years: 5.00    Types: Cigarettes    Quit date: 10/20/1998  . Smokeless tobacco: Former Systems developer     Comment: used chewing tobacco for about a year at age 11  . Alcohol use No  . Drug use: No  . Sexual activity: Not on file   Other Topics Concern  . Not on file   Social History Narrative  . No narrative on file   Depression screen [facility] 2/9 06/27/2016 03/03/2016 11/07/2015 07/14/2015 03/18/2015  Decreased Interest 0 0 0 0 2  Down, Depressed, Hopeless 0 0 0 0 2  PHQ - 2 Score 0 0 0 0 4  Altered sleeping - - - - 0  Tired, decreased energy - - - - 3  Change in appetite - - - - 2  Feeling bad or failure about yourself  - - - - 1  Trouble concentrating - - - - 0  Moving slowly or fidgety/restless - - - - 0  Suicidal thoughts - - - - 0  PHQ-9 Score - - - - 10   Review of Systems  Eyes: Negative for visual disturbance.  Gastrointestinal: Negative for abdominal pain, constipation and diarrhea.  Genitourinary: Negative for difficulty urinating, frequency and urgency.  Neurological: Negative for numbness.    Objective:  Physical Exam  Constitutional: He appears  well-developed and well-nourished. No distress.  HENT:  Head: Normocephalic and atraumatic.  Eyes: Conjunctivae are normal.  Neck: Neck supple.  Cardiovascular: Normal rate, regular rhythm and normal heart sounds.  Exam reveals no gallop and no friction rub.   No murmur heard. Pulmonary/Chest: Effort  normal and breath sounds normal. No respiratory distress. He has no wheezes. He has no rales.  Abdominal: There is no tenderness.  Neurological: He is alert.  Skin: Skin is warm and dry.  Psychiatric: He has a normal mood and affect. His behavior is normal.  Nursing note and vitals reviewed.  BP (!) 170/92 (BP Location: Right Arm, Patient Position: Sitting, Cuff Size: Normal)   Pulse (!) 57   Temp 98.8 F (37.1 C) (Oral)   Resp 15   Ht 5' 11.5" (1.816 m)   Wt 198 lb 12.8 oz (90.2 kg)   SpO2 98%   BMI 27.34 kg/m    Pt's rechecked bp was 142/100 Assessment & Plan:  Pt should have been out of atenolol over 3 months prior though he does claim he is using as rx'd.  1. Type 2 diabetes mellitus with diabetic polyneuropathy, without long-term current use of insulin ([place])   2. Chronic pain syndrome   3. Chest wall pain, chronic   4. Debility   5. Essential hypertension   Advised pt that he is due for lipid, cmp, a1c which will likely be about $150. Cannot afford this today but agrees to do at f/u OV in 4 mos.   Meds ordered this encounter  Medications  . Oxycodone HCl 10 MG TABS    Sig: Take 1 tablet (10 mg total) by mouth every 4 (four) hours as needed (pain).    Dispense:  150 tablet    Refill:  0  . Oxycodone HCl 10 MG TABS    Sig: Take 1 tablet (10 mg total) by mouth every 6 (six) hours as needed. May fill 90 days after script.    Dispense:  150 tablet    Refill:  0  . Oxycodone HCl 10 MG TABS    Sig: Take 1 tablet (10 mg total) by mouth every 6 (six) hours as needed (pain). May fill 30 days after script.    Dispense:  150 tablet    Refill:  0  . Oxycodone HCl 10 MG TABS     Sig: Take 1 tablet (10 mg total) by mouth every 6 (six) hours as needed (pain). May fill 60 days after script    Dispense:  150 tablet    Refill:  0  . fentaNYL (DURAGESIC) 75 MCG/HR    Sig: Place 1 patch (75 mcg total) onto the skin every 3 (three) days. May fill 60 days after script.    Dispense:  10 patch    Refill:  0  . fentaNYL (DURAGESIC) 75 MCG/HR    Sig: Place 1 patch (75 mcg total) onto the skin every 3 (three) days. May fill 90 days after script.    Dispense:  10 patch    Refill:  0  . fentaNYL (DURAGESIC) 75 MCG/HR    Sig: Place 1 patch (75 mcg total) onto the skin every 3 (three) days.    Dispense:  10 patch    Refill:  0  . fentaNYL (DURAGESIC - DOSED MCG/HR) 75 MCG/HR    Sig: Place 1 patch (75 mcg total) onto the skin every 3 (three) days. May fill 30 days after script.    Dispense:  10 patch    Refill:  0  . losartan (COZAAR) 100 MG tablet    Sig: Take 1 tablet (100 mg total) by mouth daily.    Dispense:  90 tablet    Refill:  3  . atenolol (TENORMIN) 100 MG tablet  Sig: Take 1 tablet (100 mg total) by mouth daily.    Dispense:  90 tablet    Refill:  3    I personally performed the services described in this documentation, which was scribed in my presence. The recorded information has been reviewed and considered, and addended by me as needed.   [name], M.D.  [facility] [address] [place], [place] [address] [phone] phone [phone] fax  07/01/16 11:36 PM

## 2016-08-28 ENCOUNTER — Other Ambulatory Visit: Payer: Self-pay | Admitting: Family Medicine

## 2016-09-23 ENCOUNTER — Other Ambulatory Visit: Payer: Self-pay

## 2016-09-23 NOTE — Telephone Encounter (Signed)
Last Rx 03/03/16 for #120 + 5 RFs. Last OV 06/27/16. Pharm is requesting RF.

## 2016-10-03 MED ORDER — CARISOPRODOL 350 MG PO TABS: ORAL_TABLET | ORAL | 2 refills | Status: DC

## 2016-10-03 NOTE — Telephone Encounter (Signed)
Please call pt and let him know rx is ready for pick up or I think this can be faxed in if he prefers.

## 2016-10-06 NOTE — Telephone Encounter (Signed)
This can be faxed in and was req'd by pharm not pt, so I just faxed in to pharm.

## 2016-10-27 ENCOUNTER — Encounter: Payer: Self-pay | Admitting: Family Medicine

## 2016-10-27 ENCOUNTER — Ambulatory Visit (INDEPENDENT_AMBULATORY_CARE_PROVIDER_SITE_OTHER): Payer: Self-pay | Admitting: Family Medicine

## 2016-10-27 VITALS — BP 150/83 | HR 70 | Temp 97.8°F | Resp 16 | Ht 71.5 in | Wt 190.2 lb

## 2016-10-27 DIAGNOSIS — M4714 Other spondylosis with myelopathy, thoracic region: Secondary | ICD-10-CM

## 2016-10-27 DIAGNOSIS — E1142 Type 2 diabetes mellitus with diabetic polyneuropathy: Secondary | ICD-10-CM

## 2016-10-27 DIAGNOSIS — G8929 Other chronic pain: Secondary | ICD-10-CM

## 2016-10-27 DIAGNOSIS — I10 Essential (primary) hypertension: Secondary | ICD-10-CM

## 2016-10-27 DIAGNOSIS — K760 Fatty (change of) liver, not elsewhere classified: Secondary | ICD-10-CM | POA: Insufficient documentation

## 2016-10-27 DIAGNOSIS — M940 Chondrocostal junction syndrome [Tietze]: Secondary | ICD-10-CM

## 2016-10-27 DIAGNOSIS — R5381 Other malaise: Secondary | ICD-10-CM

## 2016-10-27 DIAGNOSIS — R0789 Other chest pain: Secondary | ICD-10-CM

## 2016-10-27 DIAGNOSIS — G894 Chronic pain syndrome: Secondary | ICD-10-CM

## 2016-10-27 DIAGNOSIS — Z5181 Encounter for therapeutic drug level monitoring: Secondary | ICD-10-CM

## 2016-10-27 HISTORY — DX: Fatty (change of) liver, not elsewhere classified: K76.0

## 2016-10-27 LAB — POCT GLYCOSYLATED HEMOGLOBIN (HGB A1C): Hemoglobin A1C: 6.6

## 2016-10-27 MED ORDER — METFORMIN HCL 1000 MG PO TABS: 1000.0000 mg | ORAL_TABLET | Freq: Two times a day (BID) | ORAL | 3 refills | Status: DC

## 2016-10-27 MED ORDER — OXYCODONE HCL 10 MG PO TABS: 10.0000 mg | ORAL_TABLET | ORAL | 0 refills | Status: DC | PRN

## 2016-10-27 MED ORDER — OXYCODONE HCL 10 MG PO TABS: 10.0000 mg | ORAL_TABLET | Freq: Four times a day (QID) | ORAL | 0 refills | Status: DC | PRN

## 2016-10-27 MED ORDER — FENTANYL 75 MCG/HR TD PT72: 75.0000 ug | MEDICATED_PATCH | TRANSDERMAL | 0 refills | Status: DC

## 2016-10-27 MED ORDER — ATENOLOL 100 MG PO TABS: 100.0000 mg | ORAL_TABLET | Freq: Every day | ORAL | 3 refills | Status: DC

## 2016-10-27 MED ORDER — CARISOPRODOL 350 MG PO TABS: ORAL_TABLET | ORAL | 3 refills | Status: DC

## 2016-10-27 NOTE — Patient Instructions (Addendum)
IF you received an x-ray today, you will receive an invoice from [facility]. Please contact [facility] at [phone] with questions or concerns regarding your invoice.   IF you received labwork today, you will receive an invoice from [place]. Please contact [facility] at [phone] with questions or concerns regarding your invoice.   Our billing staff will not be able to assist you with questions regarding bills from these companies.  You will be contacted with the lab results as soon as they are available. The fastest way to get your results is to activate your My Chart account. Instructions are located on the last page of this paperwork. If you have not heard from [place] regarding the results in 2 weeks, please contact this office.   Nonalcoholic Fatty Liver Disease Diet Introduction Nonalcoholic fatty liver disease is a condition that causes fat to accumulate in and around the liver. The disease makes it harder for the liver to work the way that it should. Following a healthy diet can help to keep nonalcoholic fatty liver disease under control. It can also help to prevent or improve conditions that are associated with the disease, such as heart disease, diabetes, high blood pressure, and abnormal cholesterol levels. Along with regular exercise, this diet:  Promotes weight loss.  Helps to control blood sugar levels.  Helps to improve the way that the body uses insulin. What do I need to know about this diet?  Use the glycemic index (GI) to plan your meals. The index tells you how quickly a food will raise your blood sugar. Choose low-GI foods. These foods take a longer time to raise blood sugar.  Keep track of how many calories you take in. Eating the right amount of calories will help you to achieve a healthy weight.  You may want to follow a Mediterranean diet. This diet includes a lot of vegetables, lean meats or fish, whole grains, fruits, and healthy oils and  fats. What foods can I eat? Grains  Whole grains, such as whole-wheat or whole-grain breads, crackers, tortillas, cereals, and pasta. Stone-ground whole wheat. Pumpernickel bread. Unsweetened oatmeal. Bulgur. Barley. Quinoa. Brown or wild rice. Corn or whole-wheat flour tortillas. Vegetables  Lettuce. Spinach. Peas. Beets. Cauliflower. Cabbage. Broccoli. Carrots. Tomatoes. Squash. Eggplant. Herbs. Peppers. Onions. Cucumbers. Brussels sprouts. Yams and sweet potatoes. Beans. Lentils. Fruits  Bananas. Apples. Oranges. Grapes. Papaya. Mango. Pomegranate. Kiwi. Grapefruit. Cherries. Meats and Other Protein Sources  Seafood and shellfish. Lean meats. Poultry. Tofu. Dairy  Low-fat or fat-free dairy products, such as yogurt, cottage cheese, and cheese. Beverages  Water. Sugar-free drinks. Tea. Coffee. Low-fat or skim milk. Milk alternatives, such as soy or almond milk. Real fruit juice. Condiments  Mustard. Relish. Low-fat, low-sugar ketchup and barbecue sauce. Low-fat or fat-free mayonnaise. Sweets and Desserts  Sugar-free sweets. Fats and Oils  Avocado. Canola or olive oil. Nuts and nut butters. Seeds. The items listed above may not be a complete list of recommended foods or beverages. Contact your dietitian for more options.  What foods are not recommended? Palm oil and coconut oil. Processed foods. Fried foods. Sweetened drinks, such as sweet tea, milkshakes, snow cones, iced sweet drinks, and sodas. Alcohol. Sweets. Foods that contain a lot of salt or sodium. The items listed above may not be a complete list of foods and beverages to avoid. Contact your dietitian for more information.  This information is not intended to replace advice given to you by your health care provider. Make sure you  discuss any questions you have with your health care provider. Document Released: 02/20/2015 Document Revised: 03/13/2016 Document Reviewed: 10/31/2014  2017 Elsevier    Fatty  Liver Introduction Fatty liver, also called hepatic steatosis or steatohepatitis, is a condition in which too much fat has built up in your liver cells. The liver removes harmful substances from your bloodstream. It produces fluids your body needs. It also helps your body use and store energy from the food you eat. In many cases, fatty liver does not cause symptoms or problems. It is often diagnosed when tests are being done for other reasons. However, over time, fatty liver can cause inflammation that may lead to more serious liver problems, such as scarring of the liver (cirrhosis). What are the causes? Causes of fatty liver may include:  Drinking too much alcohol.  Poor nutrition.  Obesity.  Cushing syndrome.  Diabetes.  Hyperlipidemia.  Pregnancy.  Certain drugs.  Poisons.  Some viral infections. What increases the risk? You may be more likely to develop fatty liver if you:  Abuse alcohol.  Are pregnant.  Are overweight.  Have diabetes.  Have hepatitis.  Have a high triglyceride level. What are the signs or symptoms? Fatty liver often does not cause any symptoms. In cases where symptoms develop, they can include:  Fatigue.  Weakness.  Weight loss.  Confusion.  Abdominal pain.  Yellowing of your skin and the white parts of your eyes (jaundice).  Nausea and vomiting. How is this diagnosed? Fatty liver may be diagnosed by:  Physical exam and medical history.  Blood tests.  Imaging tests, such as an ultrasound, CT scan, or MRI.  Liver biopsy. A small sample of liver tissue is removed using a needle. The sample is then looked at under a microscope. How is this treated? Fatty liver is often caused by other health conditions. Treatment for fatty liver may involve medicines and lifestyle changes to manage conditions such as:  Alcoholism.  High cholesterol.  Diabetes.  Being overweight or obese. Follow these instructions at home:  Eat a healthy  diet as directed by your health care provider.  Exercise regularly. This can help you lose weight and control your cholesterol and diabetes. Talk to your health care provider about an exercise plan and which activities are best for you.  Do not drink alcohol.  Take medicines only as directed by your health care provider. Contact a health care provider if: You have difficulty controlling your:  Blood sugar.  Cholesterol.  Alcohol consumption. Get help right away if:  You have abdominal pain.  You have jaundice.  You have nausea and vomiting. This information is not intended to replace advice given to you by your health care provider. Make sure you discuss any questions you have with your health care provider. Document Released: 11/21/2005 Document Revised: 03/13/2016 Document Reviewed: 02/15/2014  2017 Elsevier

## 2016-10-27 NOTE — Progress Notes (Signed)
Subjective:    Patient ID: Robert Burnett, male    DOB: 03/14/1961, 56 y.o.   MRN: [id number] Chief Complaint  Patient presents with  . Medication Refill    HPI  Robert Burnett is a 56 y.o. male who presents to the [facility] for medication refill. Pt has had a glass of coke and water for the whole day. Pt would ike tog et blood work done another day due to financial concerns.  HTN: Takes Losartan and Atenolol for his bp.  He does have a blood pressure cuff at home and notes that his blood pressure is often in the 140s-150s/80s when it has been 24 hours since he took his BP med.  But often at home his blood pressure 120-130s.  T2DM: Takes Metformin 1000 mg. Pt states his blood sugars have been doing better this past month with it running 126 this morning.  He ran out of his diabetes medicines last week but cbgs 125-150. Up to 173 fasting at the highest when on meds.  He has not had hypoglycemic episodes.  No GI/GU concerns.  No vision changes.  Feet tingling about the same. The glipizide made him gain weight.  Pain: Helen Hashimoto for his pain and reports it gets some of his pain but not all of it.  He again recounts the story about stepping in a hole while dragging in deer after hunting.  He does get very severe posterior leg/hamstring spasms and notes that he has much less of spasms on it.   GERD: Pt takes Protonix every 3-4 days, but mainly 4 days in between usage.  Allergies: Pt has had to use Flonase about a month ago.  HPL: tried pravastatin, lovastatin, gemfibrozil, Right now he feels that it would be an extra expense and stress that he couldn't take.   Past Medical History:  Diagnosis Date  . Allergy   . Arthritis   . Diabetes mellitus    toe amp 4/12  . Hyperlipidemia   . Hypertension    Past Surgical History:  Procedure Laterality Date  . KNEE SURGERY  1975 and 1989   Current Outpatient Prescriptions on File Prior to Visit  Medication Sig  Dispense Refill  . aspirin 81 MG tablet Take 81 mg by mouth daily.    . fluticasone (FLONASE) 50 MCG/ACT nasal spray Place 2 sprays into the nose daily.    Marland Kitchen losartan (COZAAR) 100 MG tablet Take 1 tablet (100 mg total) by mouth daily. 90 tablet 3  . pantoprazole (PROTONIX) 40 MG tablet TAKE 1 TABLET BY MOUTH ONCE A DAY 90 tablet 1   No current facility-administered medications on file prior to visit.    Allergies  Allergen Reactions  . Prozac [Fluoxetine Hcl]   . Amlodipine Rash   Family History  Problem Relation Age of Onset  . Hypertension Father   . Diabetes Father   . Cancer Father   . Diabetes Sister   . Cancer Sister   . Cancer Mother    Social History   Social History  . Marital status: Single    Spouse name: N/A  . Number of children: N/A  . Years of education: N/A   Social History Main Topics  . Smoking status: Former Smoker    Packs/day: 2.00    Years: 5.00    Types: Cigarettes    Quit date: 10/20/1998  . Smokeless tobacco: Former Systems developer     Comment: used chewing tobacco for about  a year at age 60  . Alcohol use No  . Drug use: No  . Sexual activity: Not Asked   Other Topics Concern  . None   Social History Narrative  . None   Depression screen [facility] 2/9 10/27/2016 06/27/2016 03/03/2016 11/07/2015 07/14/2015  Decreased Interest 0 0 0 0 0  Down, Depressed, Hopeless 0 0 0 0 0  PHQ - 2 Score 0 0 0 0 0  Altered sleeping - - - - -  Tired, decreased energy - - - - -  Change in appetite - - - - -  Feeling bad or failure about yourself  - - - - -  Trouble concentrating - - - - -  Moving slowly or fidgety/restless - - - - -  Suicidal thoughts - - - - -  PHQ-9 Score - - - - -    Review of Systems See hpi    Objective:   Physical Exam  Constitutional: He is oriented to person, place, and time. He appears well-developed and well-nourished. No distress.  HENT:  Head: Normocephalic and atraumatic.  Eyes: Conjunctivae are normal. Pupils are equal, round, and  reactive to light. No scleral icterus.  Neck: Normal range of motion. Neck supple. No thyromegaly present.  Cardiovascular: Normal rate, regular rhythm, normal heart sounds and intact distal pulses.   Pulmonary/Chest: Effort normal and breath sounds normal. No respiratory distress.  Musculoskeletal: He exhibits no edema.  Lymphadenopathy:    He has no cervical adenopathy.  Neurological: He is alert and oriented to person, place, and time.  Skin: Skin is warm and dry. He is not diaphoretic.  Psychiatric: He has a normal mood and affect. His behavior is normal.     140/90 BP (!) 150/83 (BP Location: Right Arm, Patient Position: Sitting, Cuff Size: Small)   Pulse 70   Temp 97.8 F (36.6 C) (Oral)   Resp 16   Ht 5' 11.5" (1.816 m)   Wt 190 lb 3.2 oz (86.3 kg)   SpO2 99%   BMI 26.16 kg/m      Assessment & Plan:   1. Type 2 diabetes mellitus with diabetic polyneuropathy, without long-term current use of insulin (HCC) - much improved w/ a1c 6.6 today  2. Chronic pain syndrome   3. Chest wall pain, chronic   4. Debility   5. Essential hypertension   6. Thoracic spondylosis with myelopathy   7. Costochondritis   8. Medication monitoring encounter   9. Fatty liver disease, nonalcoholic     Orders Placed This Encounter  Procedures  . Comprehensive metabolic panel  . POCT glycosylated hemoglobin (Hb A1C)    Meds ordered this encounter  Medications  . Oxycodone HCl 10 MG TABS    Sig: Take 1 tablet (10 mg total) by mouth every 4 (four) hours as needed (pain).    Dispense:  150 tablet    Refill:  0  . Oxycodone HCl 10 MG TABS    Sig: Take 1 tablet (10 mg total) by mouth every 6 (six) hours as needed (pain). May fill 30 days after script.    Dispense:  150 tablet    Refill:  0  . Oxycodone HCl 10 MG TABS    Sig: Take 1 tablet (10 mg total) by mouth every 6 (six) hours as needed. May fill 90 days after script.    Dispense:  150 tablet    Refill:  0  . Oxycodone HCl 10 MG  TABS    Sig:  Take 1 tablet (10 mg total) by mouth every 6 (six) hours as needed (pain). May fill 60 days after script    Dispense:  150 tablet    Refill:  0  . fentaNYL (DURAGESIC) 75 MCG/HR    Sig: Place 1 patch (75 mcg total) onto the skin every 3 (three) days. May fill 60 days after script.    Dispense:  10 patch    Refill:  0  . fentaNYL (DURAGESIC) 75 MCG/HR    Sig: Place 1 patch (75 mcg total) onto the skin every 3 (three) days. May fill 90 days after script.    Dispense:  10 patch    Refill:  0  . fentaNYL (DURAGESIC) 75 MCG/HR    Sig: Place 1 patch (75 mcg total) onto the skin every 3 (three) days.    Dispense:  10 patch    Refill:  0  . fentaNYL (DURAGESIC - DOSED MCG/HR) 75 MCG/HR    Sig: Place 1 patch (75 mcg total) onto the skin every 3 (three) days. May fill 30 days after script.    Dispense:  10 patch    Refill:  0  . atenolol (TENORMIN) 100 MG tablet    Sig: Take 1 tablet (100 mg total) by mouth daily.    Dispense:  90 tablet    Refill:  3  . metFORMIN (GLUCOPHAGE) 1000 MG tablet    Sig: Take 1 tablet (1,000 mg total) by mouth 2 (two) times daily with a meal.    Dispense:  180 tablet    Refill:  3  . carisoprodol (SOMA) 350 MG tablet    Sig: TAKE ONE TABLET BY MOUTH FOUR TIMES DAILY AS NEEDED FOR MUSCLE SPASMS.    Dispense:  120 tablet    Refill:  3     [name], M.D.  [facility] [address] [place], [place] [address] [phone] phone [phone] fax  11/21/16 9:49 PM  Results for orders placed or performed in visit on 10/27/16  Comprehensive metabolic panel  Result Value Ref Range   Glucose 176 (H) 65 - 99 mg/dL   BUN 11 6 - 24 mg/dL   Creatinine, Ser 0.85 0.76 - 1.27 mg/dL   GFR calc non Af Amer 97 >59 mL/min/1.73   GFR calc Af Amer 113 >59 mL/min/1.73   BUN/Creatinine Ratio 13 9 - 20   Sodium 136 134 - 144 mmol/L   Potassium 4.3 3.5 - 5.2 mmol/L   Chloride 96 96 - 106 mmol/L   CO2 24 18 - 29 mmol/L    Calcium 10.2 8.7 - 10.2 mg/dL   Total Protein 7.7 6.0 - 8.5 g/dL   Albumin 4.7 3.5 - 5.5 g/dL   Globulin, Total 3.0 1.5 - 4.5 g/dL   Albumin/Globulin Ratio 1.6 1.2 - 2.2   Bilirubin Total 0.3 0.0 - 1.2 mg/dL   Alkaline Phosphatase 82 39 - 117 IU/L   AST 22 0 - 40 IU/L   ALT 24 0 - 44 IU/L  POCT glycosylated hemoglobin (Hb A1C)  Result Value Ref Range   Hemoglobin A1C 6.6

## 2016-10-28 LAB — COMPREHENSIVE METABOLIC PANEL
A/G RATIO: 1.6 (ref 1.2–2.2)
ALBUMIN: 4.7 g/dL (ref 3.5–5.5)
ALT: 24 IU/L (ref 0–44)
AST: 22 IU/L (ref 0–40)
Alkaline Phosphatase: 82 IU/L (ref 39–117)
BILIRUBIN TOTAL: 0.3 mg/dL (ref 0.0–1.2)
BUN / CREAT RATIO: 13 (ref 9–20)
BUN: 11 mg/dL (ref 6–24)
CALCIUM: 10.2 mg/dL (ref 8.7–10.2)
CHLORIDE: 96 mmol/L (ref 96–106)
CO2: 24 mmol/L (ref 18–29)
Creatinine, Ser: 0.85 mg/dL (ref 0.76–1.27)
GFR, EST AFRICAN AMERICAN: 113 mL/min/{1.73_m2} (ref >59)
GFR, EST NON AFRICAN AMERICAN: 97 mL/min/{1.73_m2} (ref >59)
GLOBULIN, TOTAL: 3 g/dL (ref 1.5–4.5)
Glucose: 176 mg/dL — ABNORMAL HIGH (ref 65–99)
POTASSIUM: 4.3 mmol/L (ref 3.5–5.2)
Sodium: 136 mmol/L (ref 134–144)
TOTAL PROTEIN: 7.7 g/dL (ref 6.0–8.5)

## 2017-02-20 ENCOUNTER — Ambulatory Visit (INDEPENDENT_AMBULATORY_CARE_PROVIDER_SITE_OTHER): Payer: Self-pay | Admitting: Family Medicine

## 2017-02-20 ENCOUNTER — Encounter: Payer: Self-pay | Admitting: Family Medicine

## 2017-02-20 VITALS — BP 156/70 | HR 64 | Temp 98.7°F | Resp 16 | Ht 71.5 in | Wt 194.0 lb

## 2017-02-20 DIAGNOSIS — E1142 Type 2 diabetes mellitus with diabetic polyneuropathy: Secondary | ICD-10-CM

## 2017-02-20 DIAGNOSIS — I10 Essential (primary) hypertension: Secondary | ICD-10-CM

## 2017-02-20 DIAGNOSIS — G894 Chronic pain syndrome: Secondary | ICD-10-CM

## 2017-02-20 MED ORDER — LOSARTAN POTASSIUM-HCTZ 100-25 MG PO TABS: 1.0000 | ORAL_TABLET | Freq: Every day | ORAL | 1 refills | Status: DC

## 2017-02-20 MED ORDER — FENTANYL 75 MCG/HR TD PT72: 75.0000 ug | MEDICATED_PATCH | TRANSDERMAL | 0 refills | Status: DC

## 2017-02-20 MED ORDER — OXYCODONE HCL 10 MG PO TABS
10.0000 mg | ORAL_TABLET | ORAL | 0 refills | Status: DC | PRN
Start: 2017-02-20 — End: 2017-06-18

## 2017-02-20 MED ORDER — OXYCODONE HCL 10 MG PO TABS: 10.0000 mg | ORAL_TABLET | ORAL | 0 refills | Status: DC | PRN

## 2017-02-20 MED ORDER — CARISOPRODOL 350 MG PO TABS: ORAL_TABLET | ORAL | 3 refills | Status: DC

## 2017-02-20 NOTE — Patient Instructions (Addendum)
IF you received an x-ray today, you will receive an invoice from [facility]. Please contact [facility] at [phone] with questions or concerns regarding your invoice.   IF you received labwork today, you will receive an invoice from [place]. Please contact [facility] at [phone] with questions or concerns regarding your invoice.   Our billing staff will not be able to assist you with questions regarding bills from these companies.  You will be contacted with the lab results as soon as they are available. The fastest way to get your results is to activate your My Chart account. Instructions are located on the last page of this paperwork. If you have not heard from [place] regarding the results in 2 weeks, please contact this office.    Managing Your Hypertension Hypertension is commonly called high blood pressure. This is when the force of your blood pressing against the walls of your arteries is too strong. Arteries are blood vessels that carry blood from your heart throughout your body. Hypertension forces the heart to work harder to pump blood, and may cause the arteries to become narrow or stiff. Having untreated or uncontrolled hypertension can cause heart attack, stroke, kidney disease, and other problems. What are blood pressure readings? A blood pressure reading consists of a higher number over a lower number. Ideally, your blood pressure should be below 120/80. The first ("top") number is called the systolic pressure. It is a measure of the pressure in your arteries as your heart beats. The second ("bottom") number is called the diastolic pressure. It is a measure of the pressure in your arteries as the heart relaxes. What does my blood pressure reading mean? Blood pressure is classified into four stages. Based on your blood pressure reading, your health care provider may use the following stages to determine what type of treatment you need, if any. Systolic  pressure and diastolic pressure are measured in a unit called mm Hg. Normal   Systolic pressure: below 301.  Diastolic pressure: below 80. Elevated   Systolic pressure: 601-093.  Diastolic pressure: below 80. Hypertension stage 1     Diastolic pressure: 23-55. Hypertension stage 2   Systolic pressure: 732 or above.  Diastolic pressure: 90 or above. What health risks are associated with hypertension? Managing your hypertension is an important responsibility. Uncontrolled hypertension can lead to:  A heart attack.  A stroke.  A weakened blood vessel (aneurysm).  Heart failure.  Kidney damage.  Eye damage.  Metabolic syndrome.  Memory and concentration problems. What changes can I make to manage my hypertension? Eating and drinking   Eat a diet that is high in fiber and potassium, and low in salt (sodium), added sugar, and fat. An example eating plan is called the DASH (Dietary Approaches to Stop Hypertension) diet. To eat this way:  Eat plenty of fresh fruits and vegetables. Try to fill half of your plate at each meal with fruits and vegetables.  Eat whole grains, such as whole wheat pasta, brown rice, or whole grain bread. Fill about one quarter of your plate with whole grains.  Eat low-fat diary products.  Avoid fatty cuts of meat, processed or cured meats, and poultry with skin. Fill about one quarter of your plate with lean proteins such as fish, chicken without skin, beans, eggs, and tofu.  Avoid premade and processed foods. These tend to be higher in sodium, added sugar, and fat.     Lifestyle   Work with your health care provider  to maintain a healthy body weight, or to lose weight. Ask what an ideal weight is for you.  Get at least 30 minutes of exercise that causes your heart to beat faster (aerobic exercise) most days of the week. Activities may include walking, swimming, or biking.       Monitoring   Monitor your blood pressure at home as  told by your health care provider. Your personal target blood pressure may vary depending on your medical conditions, your age, and other factors.  Have your blood pressure checked regularly, as often as told by your health care provider. Working with your health care provider   Review all the medicines you take with your health care provider because there may be side effects or interactions.  Talk with your health care provider about your diet, exercise habits, and other lifestyle factors that may be contributing to hypertension.  Visit your health care provider regularly. Your health care provider can help you create and adjust your plan for managing hypertension. Will I need medicine to control my blood pressure? Your health care provider may prescribe medicine if lifestyle changes are not enough to get your blood pressure under control, and if:  Your systolic blood pressure is 130 or higher.  Your diastolic blood pressure is 80 or higher. Take medicines only as told by your health care provider. Follow the directions carefully. Blood pressure medicines must be taken as prescribed. The medicine does not work as well when you skip doses. Skipping doses also puts you at risk for problems. Contact a health care provider if:  You think you are having a reaction to medicines you have taken.  You have repeated (recurrent) headaches.  You feel dizzy.  You have swelling in your ankles.  You have trouble with your vision. Get help right away if:  You develop a severe headache or confusion.  You have unusual weakness or numbness, or you feel faint.  You have severe pain in your chest or abdomen.  You vomit repeatedly.  You have trouble breathing. Summary  Hypertension is when the force of blood pumping through your arteries is too strong. If this condition is not controlled, it may put you at risk for serious complications.  Your personal target blood pressure may vary depending  on your medical conditions, your age, and other factors. For most people, a normal blood pressure is less than 120/80.  Hypertension is managed by lifestyle changes, medicines, or both. Lifestyle changes include weight loss, eating a healthy, low-sodium diet, exercising more, and limiting alcohol. This information is not intended to replace advice given to you by your health care provider. Make sure you discuss any questions you have with your health care provider. Document Released: 06/30/2012 Document Revised: 09/03/2016 Document Reviewed: 09/03/2016 Elsevier Interactive Patient Education  2017 [employer].

## 2017-02-20 NOTE — Progress Notes (Signed)
Subjective:  This chart was scribed for Robert Knapp, MD by Tamsen Roers, at [place] at [facility].  This patient was seen in room 3 and the patient's care was started at 11:54 AM.   Chief Complaint  Patient presents with  . Medication Refill    Oxycodone, fentanyl      Patient ID: Robert Burnett, male    DOB: 1961-02-26, 56 y.o.   MRN: [id number]  HPI HPI Comments: Robert Burnett is a 56 y.o. male who presents to Primary Care at [facility] for medication refill.   NIO:EVOJJ Losartan and Atenolol for his bp.  He does have a blood pressure cuff at home and notes that his blood pressure is often in the 140s-150s/80s when it has been 24 hours since he took his BP med.  But often at home his blood pressure 120-130s. -- Patient is compliant with his blood pressure medication and has a cuff at home to check it. A couple days ago, it was reading 150/75. Patient denies any issues with urination but does get up more than 1 time while sleeping if he does drink a lot of water.   T2DM:Four months prior Hemoglobin A1C was decreased to 6.6. Takes Metformin 1000 mg. Pt states his blood sugars have been doing better this past month with it running 126 this morning.  He ran out of his diabetes medicines last week but cbgs 125-150. Up to 173 fasting at the highest when on meds.  He has not had hypoglycemic episodes.  No GI/GU concerns.  No vision changes.  Feet tingling about the same. The glipizide made him gain weight. Lab Results  Component Value Date   HGBA1C 6.6 10/27/2016   HGBA1C 9.2 11/07/2015   HGBA1C 8.9 07/14/2015   HGBA1C 10.8 03/18/2015  ---Patient states that his blood sugar has been doing good.  He has not had any times where it has been too low. He denies any side effects from the Metformin.    Pain:Takes Soma for his pain and reports it gets some of his pain but not all of it.  He again recounts the story about stepping in a hole while dragging in deer after hunting.  He does  get very severe posterior leg/hamstring spasms and notes that he has much less of spasms on it. --- Patient states that his medication really helps with his pain.    GERD:Pt takes Protonix every 3-4 days, but mainly 4 days in between usage. -- Patient states that he does not get any indigestion or heart burn as long as he takes Protonix.  He does not have to take it daily.   Allergies:Pt has had to use Flonase about a month ago. --- Patients allergies are doing well and he has only used his Flonase 1 time this year.   BP left arm recheck: 155/74    Patient Active Problem List   Diagnosis Date Noted  . Fatty liver disease, nonalcoholic 00/93/8182  . Hypertension 06/27/2016  . Thoracic spondylosis with myelopathy 03/16/2013  . Carpal tunnel syndrome 03/16/2013  . Chest wall pain, chronic 03/16/2013  . Type 2 diabetes mellitus with peripheral neuropathy ([place]) 03/16/2013  . Debility 03/16/2013  . Chronic pain syndrome 03/16/2013  . Lumbar degenerative disc disease 12/12/2011  . Costochondritis 12/12/2011  . Diabetic peripheral neuropathy associated with type 2 diabetes mellitus ([place]) 12/12/2011   Past Medical History:  Diagnosis Date  . Allergy   . Arthritis   . Diabetes mellitus  toe amp 4/12  . Hyperlipidemia   . Hypertension    Past Surgical History:  Procedure Laterality Date  . KNEE SURGERY  1975 and 1989   Allergies  Allergen Reactions  . Prozac [Fluoxetine Hcl]   . Amlodipine Rash   Prior to Admission medications   Medication Sig Start Date End Date Taking? Authorizing Provider  aspirin 81 MG tablet Take 81 mg by mouth daily.    Historical Provider, MD  atenolol (TENORMIN) 100 MG tablet Take 1 tablet (100 mg total) by mouth daily. 10/27/16   Robert Knapp, MD  carisoprodol (SOMA) 350 MG tablet TAKE ONE TABLET BY MOUTH FOUR TIMES DAILY AS NEEDED FOR MUSCLE SPASMS. 10/27/16   Robert Knapp, MD  fentaNYL (DURAGESIC - DOSED MCG/HR) 75 MCG/HR Place 1 patch (75 mcg total)  onto the skin every 3 (three) days. May fill 30 days after script. 10/27/16   Robert Knapp, MD  fentaNYL (DURAGESIC) 75 MCG/HR Place 1 patch (75 mcg total) onto the skin every 3 (three) days. May fill 60 days after script. 10/27/16   Robert Knapp, MD  fentaNYL (DURAGESIC) 75 MCG/HR Place 1 patch (75 mcg total) onto the skin every 3 (three) days. May fill 90 days after script. 10/27/16   Robert Knapp, MD  fentaNYL (DURAGESIC) 75 MCG/HR Place 1 patch (75 mcg total) onto the skin every 3 (three) days. 10/27/16   Robert Knapp, MD  fluticasone (FLONASE) 50 MCG/ACT nasal spray Place 2 sprays into the nose daily.    Historical Provider, MD  losartan (COZAAR) 100 MG tablet Take 1 tablet (100 mg total) by mouth daily. 06/27/16   Robert Knapp, MD  metFORMIN (GLUCOPHAGE) 1000 MG tablet Take 1 tablet (1,000 mg total) by mouth 2 (two) times daily with a meal. 10/27/16   Robert Knapp, MD  Oxycodone HCl 10 MG TABS Take 1 tablet (10 mg total) by mouth every 4 (four) hours as needed (pain). 10/27/16   Robert Knapp, MD  Oxycodone HCl 10 MG TABS Take 1 tablet (10 mg total) by mouth every 6 (six) hours as needed (pain). May fill 30 days after script. 10/27/16   Robert Knapp, MD  Oxycodone HCl 10 MG TABS Take 1 tablet (10 mg total) by mouth every 6 (six) hours as needed. May fill 90 days after script. 10/27/16   Robert Knapp, MD  Oxycodone HCl 10 MG TABS Take 1 tablet (10 mg total) by mouth every 6 (six) hours as needed (pain). May fill 60 days after script 10/27/16   Robert Knapp, MD  pantoprazole ([place]) 40 MG tablet TAKE 1 TABLET BY MOUTH ONCE A DAY 08/29/16   Robert Knapp, MD   Social History   Social History  . Marital status: Single    Spouse name: N/A  . Number of children: N/A  . Years of education: N/A   Occupational History  . Not on file.   Social History Main Topics  . Smoking status: Former Smoker    Packs/day: 2.00    Years: 5.00    Types: Cigarettes    Quit date: 10/20/1998  . Smokeless tobacco: Former Systems developer     Comment: used  chewing tobacco for about a year at age 51  . Alcohol use No  . Drug use: No  . Sexual activity: Not on file   Other Topics Concern  . Not on file   Social History Narrative  . No narrative on  file      Review of Systems  Constitutional: Negative for chills and fever.  Eyes: Negative for pain, redness and itching.  Respiratory: Negative for cough, choking and shortness of breath.   Gastrointestinal: Negative for nausea and vomiting.  Neurological: Negative for syncope and speech difficulty.       Objective:   Physical Exam  Constitutional: He appears well-developed and well-nourished. No distress.  HENT:  Head: Normocephalic and atraumatic.  Neck: No thyromegaly present.  Cardiovascular: Normal rate, regular rhythm, S1 normal, S2 normal and normal heart sounds.  Exam reveals no gallop and no friction rub.   No murmur heard. Pulmonary/Chest: Effort normal and breath sounds normal. No respiratory distress. He has no wheezes. He has no rales.  Good air movement.   Neurological: He is alert.  Skin: Skin is warm and dry.  Psychiatric: He has a normal mood and affect. His behavior is normal.   Vitals:   02/20/17 1147  BP: (!) 175/74  Pulse: 64  Resp: 16  Temp: 98.7 F (37.1 C)  TempSrc: Oral  SpO2: 97%  Weight: 194 lb (88 kg)  Height: 5' 11.5" (1.816 m)       Assessment & Plan:   1. Essential hypertension - cont atenolol. Add hctz 25mg  into losartan 100mg . Increase water and K in diet  2. Chronic pain syndrome - adequately controlled on current regimen. Refilled. F/u in 4 mos for additional refills.  3.      DMII with peripheral neuropathy - cbgs at goal on current regimen of metformin 1g bid. Pt is self-pay so no optho eval. Cont asa 81mg .  Check a1c and bmp at f/u OV in 4 mos    Meds ordered this encounter  Medications  . fentaNYL (DURAGESIC) 75 MCG/HR    Sig: Place 1 patch (75 mcg total) onto the skin every 3 (three) days. May fill 60 days after script.     Dispense:  10 patch    Refill:  0  . fentaNYL (DURAGESIC) 75 MCG/HR    Sig: Place 1 patch (75 mcg total) onto the skin every 3 (three) days. May fill 90 days after script.    Dispense:  10 patch    Refill:  0  . fentaNYL (DURAGESIC) 75 MCG/HR    Sig: Place 1 patch (75 mcg total) onto the skin every 3 (three) days.    Dispense:  10 patch    Refill:  0  . Oxycodone HCl 10 MG TABS    Sig: Take 1 tablet (10 mg total) by mouth every 4 (four) hours as needed (pain). May fill 30 days after script.    Dispense:  150 tablet    Refill:  0  . Oxycodone HCl 10 MG TABS    Sig: Take 1 tablet (10 mg total) by mouth every 4 (four) hours as needed. May fill 90 days after script.    Dispense:  150 tablet    Refill:  0  . Oxycodone HCl 10 MG TABS    Sig: Take 1 tablet (10 mg total) by mouth every 4 (four) hours as needed (pain). May fill 60 days after script    Dispense:  150 tablet    Refill:  0  . fentaNYL (DURAGESIC - DOSED MCG/HR) 75 MCG/HR    Sig: Place 1 patch (75 mcg total) onto the skin every 3 (three) days. May fill 30 days after script.    Dispense:  10 patch    Refill:  0  .  Oxycodone HCl 10 MG TABS    Sig: Take 1 tablet (10 mg total) by mouth every 4 (four) hours as needed (pain).    Dispense:  150 tablet    Refill:  0  . losartan-hydrochlorothiazide (HYZAAR) 100-25 MG tablet    Sig: Take 1 tablet by mouth daily.    Dispense:  90 tablet    Refill:  1  . carisoprodol (SOMA) 350 MG tablet    Sig: TAKE ONE TABLET BY MOUTH FOUR TIMES DAILY AS NEEDED FOR MUSCLE SPASMS.    Dispense:  120 tablet    Refill:  3    I personally performed the services described in this documentation, which was scribed in my presence. The recorded information has been reviewed and considered, and addended by me as needed.   [name], M.D.  Primary Care at [facility] [address] [place], [place] [address] [phone] phone [phone] fax  02/20/17 12:25 PM

## 2017-06-18 ENCOUNTER — Ambulatory Visit (INDEPENDENT_AMBULATORY_CARE_PROVIDER_SITE_OTHER): Payer: Self-pay | Admitting: Family Medicine

## 2017-06-18 ENCOUNTER — Encounter: Payer: Self-pay | Admitting: Family Medicine

## 2017-06-18 VITALS — BP 124/84 | HR 52 | Temp 98.0°F | Resp 16 | Ht 71.26 in | Wt 184.0 lb

## 2017-06-18 DIAGNOSIS — G894 Chronic pain syndrome: Secondary | ICD-10-CM

## 2017-06-18 DIAGNOSIS — E1142 Type 2 diabetes mellitus with diabetic polyneuropathy: Secondary | ICD-10-CM

## 2017-06-18 MED ORDER — OXYCODONE HCL 10 MG PO TABS: 10.0000 mg | ORAL_TABLET | ORAL | 0 refills | Status: DC | PRN

## 2017-06-18 MED ORDER — FENTANYL 75 MCG/HR TD PT72: 75.0000 ug | MEDICATED_PATCH | TRANSDERMAL | 0 refills | Status: DC

## 2017-06-18 MED ORDER — CARISOPRODOL 350 MG PO TABS: ORAL_TABLET | ORAL | 3 refills | Status: DC

## 2017-06-18 NOTE — Progress Notes (Deleted)
   Subjective:    Patient ID: Robert Burnett, male    DOB: [birth year], 56 y.o.   MRN: [id number]  HPI  Checks CBGs qam occasionally has was about 100. Does go up to 150 after eating at times.  When faseting is almost always <120.  No problems with the metformin. He does have a callus on the bottom of the lft foot which he shaves down freq but keeps coming down.  Needs a protonix evey 4-6d and works when he3 has the burning in his chest.   Taking asa 81mg .  Review of Systems     Objective:   Physical Exam    BP 124/84   Pulse (!) 52   Temp 98 F (36.7 C) (Oral)   Resp 16   Ht 5' 11.26" (1.81 m)   Wt 184 lb (83.5 kg)   SpO2 99%   BMI 25.48 kg/m      Assessment & Plan:

## 2017-06-18 NOTE — Patient Instructions (Addendum)
Check out needymeds to see lots of great resources for people with diabetes - like how to get eye exams.    IF you received an x-ray today, you will receive an invoice from [facility]. Please contact [facility] at [phone] with questions or concerns regarding your invoice.   IF you received labwork today, you will receive an invoice from [facility]. Please contact [facility] at [phone] with questions or concerns regarding your invoice.   Our billing staff will not be able to assist you with questions regarding bills from these companies.  You will be contacted with the lab results as soon as they are available. The fastest way to get your results is to activate your My Chart account. Instructions are located on the last page of this paperwork. If you have not heard from [place] regarding the results in 2 weeks, please contact this office.      Diabetes and Foot Care Diabetes may cause you to have problems because of poor blood supply (circulation) to your feet and legs. This may cause the skin on your feet to become thinner, break easier, and heal more slowly. Your skin may become dry, and the skin may peel and crack. You may also have nerve damage in your legs and feet causing decreased feeling in them. You may not notice minor injuries to your feet that could lead to infections or more serious problems. Taking care of your feet is one of the most important things you can do for yourself. Follow these instructions at home:  Wear shoes at all times, even in the house. Do not go barefoot. Bare feet are easily injured.  Check your feet daily for blisters, cuts, and redness. If you cannot see the bottom of your feet, use a mirror or ask someone for help.  Wash your feet with warm water (do not use hot water) and mild soap. Then pat your feet and the areas between your toes until they are completely dry. Do not soak your feet as this can dry your skin.  Apply a moisturizing  lotion or petroleum jelly (that does not contain alcohol and is unscented) to the skin on your feet and to dry, brittle toenails. Do not apply lotion between your toes.  Trim your toenails straight across. Do not dig under them or around the cuticle. File the edges of your nails with an emery board or nail file.  Do not cut corns or calluses or try to remove them with medicine.  Wear clean socks or stockings every day. Make sure they are not too tight. Do not wear knee-high stockings since they may decrease blood flow to your legs.  Wear shoes that fit properly and have enough cushioning. To break in new shoes, wear them for just a few hours a day. This prevents you from injuring your feet. Always look in your shoes before you put them on to be sure there are no objects inside.  Do not cross your legs. This may decrease the blood flow to your feet.  If you find a minor scrape, cut, or break in the skin on your feet, keep it and the skin around it clean and dry. These areas may be cleansed with mild soap and water. Do not cleanse the area with peroxide, alcohol, or iodine.  When you remove an adhesive bandage, be sure not to damage the skin around it.  If you have a wound, look at it several times a day to make sure it is  healing.  Do not use heating pads or hot water bottles. They may burn your skin. If you have lost feeling in your feet or legs, you may not know it is happening until it is too late.  Make sure your health care provider performs a complete foot exam at least annually or more often if you have foot problems. Report any cuts, sores, or bruises to your health care provider immediately. Contact a health care provider if:  You have an injury that is not healing.  You have cuts or breaks in the skin.  You have an ingrown nail.  You notice redness on your legs or feet.  You feel burning or tingling in your legs or feet.  You have pain or cramps in your legs and  feet.  Your legs or feet are numb.  Your feet always feel cold. Get help right away if:  There is increasing redness, swelling, or pain in or around a wound.  There is a red line that goes up your leg.  Pus is coming from a wound.  You develop a fever or as directed by your health care provider.  You notice a bad smell coming from an ulcer or wound. This information is not intended to replace advice given to you by your health care provider. Make sure you discuss any questions you have with your health care provider. Document Released: 10/03/2000 Document Revised: 03/13/2016 Document Reviewed: 03/15/2013 Elsevier Interactive Patient Education  2017 [place] are small areas of thickened skin that occur on the top, sides, or tip of a toe. They contain a cone-shaped core with a point that can press on a nerve below. This causes pain. Calluses are areas of thickened skin that can occur anywhere on the body including hands, fingers, palms, soles of the feet, and heels.Calluses are usually larger than corns. What are the causes? Corns and calluses are caused by rubbing (friction) or pressure, such as from shoes that are too tight or do not fit properly. What increases the risk? Corns are more likely to develop in people who have toe deformities, such as hammer toes. Since calluses can occur with friction to any area of the skin, calluses are more likely to develop in people who:  Work with their hands.  Wear shoes that fit poorly, shoes that are too tight, or shoes that are high-heeled.  Have toes deformities.  What are the signs or symptoms? Symptoms of a corn or callus include:  A hard growth on the skin.  Pain or tenderness under the skin.  Redness and swelling.  Increased discomfort while wearing tight-fitting shoes.  How is this diagnosed? Corns and calluses may be diagnosed with a medical history and physical exam. How is this  treated? Corns and calluses may be treated with:  Removing the cause of the friction or pressure. This may include: ? Changing your shoes. ? Wearing shoe inserts (orthotics) or other protective layers in your shoes, such as a corn pad. ? Wearing gloves.  Medicines to help soften skin in the hardened, thickened areas.  Reducing the size of the corn or callus by removing the dead layers of skin.  Antibiotic medicines to treat infection.  Surgery, if a toe deformity is the cause.  Follow these instructions at home:  Take medicines only as directed by your health care provider.  If you were prescribed an antibiotic, finish all of it even if you start to feel better.  Wear shoes that fit well. Avoid wearing high-heeled shoes and shoes that are too tight or too loose.  Wear any padding, protective layers, gloves, or orthotics as directed by your health care provider.  Soak your hands or feet and then use a file or pumice stone to soften your corn or callus. Do this as directed by your health care provider.  Check your corn or callus every day for signs of infection. Watch for: ? Redness, swelling, or pain. ? Fluid, blood, or pus. Contact a health care provider if:  Your symptoms do not improve with treatment.  You have increased redness, swelling, or pain at the site of your corn or callus.  You have fluid, blood, or pus coming from your corn or callus.  You have new symptoms. This information is not intended to replace advice given to you by your health care provider. Make sure you discuss any questions you have with your health care provider. Document Released: 07/12/2004 Document Revised: 04/25/2016 Document Reviewed: 10/02/2014 Elsevier Interactive Patient Education  [employer].

## 2017-06-18 NOTE — Progress Notes (Signed)
Subjective:    Patient ID: Robert Burnett, male    DOB: 1961-10-16, 56 y.o.   MRN: [id number]  Chief Complaint  Patient presents with  . Pain    follow-up   . Medication Refill    Fentanyl, Oxycodone     HPI HPI Comments: CAEDON Burnett is a 56 y.o. male who presents to Primary Care at [facility] for medication refill.   [name] for his bp. He does have a blood pressure cuff at home and notes that his blood pressure is often in the 140s-150s/80s when it has been 24 hours since he took his BP med. But often at home his blood pressure 120-130s. -- Patient is compliant with his blood pressure medication and has a cuff at home to check it. A couple days ago, it was reading 150/75. Patient denies any issues with urination but does get up more than 1 time while sleeping if he does drink a lot of water.   T2DM:Four months prior Hemoglobin A1C was decreased to 6.6. Takes Metformin 1000 mg. Pt states his blood sugars have been doing better this past month with it running 126 this morning. He ran out of his diabetes medicines last week but cbgs 125-150. Up to 173 fasting at the highest when on meds. He has not had hypoglycemic episodes. No GI/GU concerns. No vision changes. Feet tingling about the same. The glipizide made him gain weight. Patient states that his blood sugar has been doing good.  He has not had any times where it has been too low. He denies any side effects from the Metformin.  Checks CBGs qam occasionally has was about 100. Does go up to 150 after eating at times.  When faseting is almost always <120.  No problems with the metformin. He does have a callus on the bottom of the lft foot which he shaves down freq but keeps coming down. Taking asa 81mg .   Pain:Takes Soma for his pain and reports it gets some of his pain but not all of it. He again recounts the story about stepping in a hole while dragging in deer after hunting. He does get very  severe posterior leg/hamstring spasms and notes that he has much less of spasms on it. --- Patient states that his medication really helps with his pain.    GERD:Pt takes Protonix every 3-4 days, but mainly 4 days in between usage. -- Patient states that he does not get any indigestion or heart burn as long as he takes Protonix.  He does not have to take it daily.  Needs a protonix evey 4-6d and works when he has the burning in his chest.   Allergies:Pt has had to use Flonase about a month ago. --- Patients allergies are doing well and he has only used his Flonase 1 time this year.   Past Medical History:  Diagnosis Date  . Allergy   . Arthritis   . Diabetes mellitus    toe amp 4/12  . Hyperlipidemia   . Hypertension    Past Surgical History:  Procedure Laterality Date  . KNEE SURGERY  1975 and 1989   Current Outpatient Prescriptions on File Prior to Visit  Medication Sig Dispense Refill  . aspirin 81 MG tablet Take 81 mg by mouth daily.    Marland Kitchen atenolol (TENORMIN) 100 MG tablet Take 1 tablet (100 mg total) by mouth daily. 90 tablet 3  . fluticasone (FLONASE) 50 MCG/ACT nasal spray Place 2 sprays  into the nose daily.    Marland Kitchen losartan-hydrochlorothiazide (HYZAAR) 100-25 MG tablet Take 1 tablet by mouth daily. 90 tablet 1  . metFORMIN (GLUCOPHAGE) 1000 MG tablet Take 1 tablet (1,000 mg total) by mouth 2 (two) times daily with a meal. 180 tablet 3  . pantoprazole (PROTONIX) 40 MG tablet TAKE 1 TABLET BY MOUTH ONCE A DAY 90 tablet 1   No current facility-administered medications on file prior to visit.    Allergies  Allergen Reactions  . Prozac [Fluoxetine Hcl]   . Amlodipine Rash   Family History  Problem Relation Age of Onset  . Hypertension Father   . Diabetes Father   . Cancer Father   . Diabetes Sister   . Cancer Sister   . Cancer Mother    Social History   Social History  . Marital status: Single    Spouse name: N/A  . Number of children: N/A  . Years of  education: N/A   Social History Main Topics  . Smoking status: Former Smoker    Packs/day: 2.00    Years: 5.00    Types: Cigarettes    Quit date: 10/20/1998  . Smokeless tobacco: Former Systems developer     Comment: used chewing tobacco for about a year at age 30  . Alcohol use No  . Drug use: No  . Sexual activity: Not Asked   Other Topics Concern  . None   Social History Narrative  . None   Depression screen [facility] 2/9 06/18/2017 02/20/2017 10/27/2016 06/27/2016 03/03/2016  Decreased Interest 0 0 0 0 0  Down, Depressed, Hopeless 0 0 0 0 0  PHQ - 2 Score 0 0 0 0 0  Altered sleeping - - - - -  Tired, decreased energy - - - - -  Change in appetite - - - - -  Feeling bad or failure about yourself  - - - - -  Trouble concentrating - - - - -  Moving slowly or fidgety/restless - - - - -  Suicidal thoughts - - - - -  PHQ-9 Score - - - - -     Review of Systems See hpi    Objective:   Physical Exam  Constitutional: He is oriented to person, place, and time. He appears well-developed and well-nourished. No distress.  HENT:  Head: Normocephalic and atraumatic.  Eyes: Pupils are equal, round, and reactive to light. Conjunctivae are normal. No scleral icterus.  Neck: Normal range of motion. Neck supple. No thyromegaly present.  Cardiovascular: Normal rate, regular rhythm, normal heart sounds and intact distal pulses.   Pulmonary/Chest: Effort normal and breath sounds normal. No respiratory distress.  Musculoskeletal: He exhibits no edema.  Lymphadenopathy:    He has no cervical adenopathy.  Neurological: He is alert and oriented to person, place, and time.  Skin: Skin is warm and dry. He is not diaphoretic.  Psychiatric: He has a normal mood and affect. His behavior is normal.   Diabetic Foot Exam - Simple   Simple Foot Form Diabetic Foot exam was performed with the following findings:  Yes 06/18/2017 11:46 AM  Visual Inspection See comments:  Yes Sensation Testing Intact to touch and  monofilament testing bilaterally:  Yes Pulse Check Posterior Tibialis and Dorsalis pulse intact bilaterally:  Yes Comments Missing left 2nd toe.  Decreased pedal pulses 1+ in left foot with cooler to touch, 2+ in right.       BP 124/84   Pulse (!) 52   Temp 98  F (36.7 C) (Oral)   Resp 16   Ht 5' 11.26" (1.81 m)   Wt 184 lb (83.5 kg)   SpO2 99%   BMI 25.48 kg/m   Assessment & Plan:   1. Type 2 diabetes mellitus with diabetic polyneuropathy, without long-term current use of insulin (HCC) = cbgs well controlled, lab next visit - limited as pt self-pay  2. Chronic pain syndrome - doing well, refilled chronic meds.  3.      HTN - Refill losartan-hctz 100-25 x 3 mos whenever requested  Orders Placed This Encounter  Procedures  . HM DIABETES FOOT EXAM    Meds ordered this encounter  Medications  . Oxycodone HCl 10 MG TABS    Sig: Take 1 tablet (10 mg total) by mouth every 4 (four) hours as needed (pain). May fill 30 days after script.    Dispense:  150 tablet    Refill:  0  . Oxycodone HCl 10 MG TABS    Sig: Take 1 tablet (10 mg total) by mouth every 4 (four) hours as needed. May fill 90 days after script.    Dispense:  150 tablet    Refill:  0  . Oxycodone HCl 10 MG TABS    Sig: Take 1 tablet (10 mg total) by mouth every 4 (four) hours as needed (pain). May fill 60 days after script    Dispense:  150 tablet    Refill:  0  . Oxycodone HCl 10 MG TABS    Sig: Take 1 tablet (10 mg total) by mouth every 4 (four) hours as needed (pain).    Dispense:  150 tablet    Refill:  0  . fentaNYL (DURAGESIC - DOSED MCG/HR) 75 MCG/HR    Sig: Place 1 patch (75 mcg total) onto the skin every 3 (three) days. May fill 30 days after script.    Dispense:  10 patch    Refill:  0  . fentaNYL (DURAGESIC) 75 MCG/HR    Sig: Place 1 patch (75 mcg total) onto the skin every 3 (three) days. May fill 60 days after script.    Dispense:  10 patch    Refill:  0  . fentaNYL (DURAGESIC) 75 MCG/HR     Sig: Place 1 patch (75 mcg total) onto the skin every 3 (three) days. May fill 90 days after script.    Dispense:  10 patch    Refill:  0  . fentaNYL (DURAGESIC) 75 MCG/HR    Sig: Place 1 patch (75 mcg total) onto the skin every 3 (three) days.    Dispense:  10 patch    Refill:  0  . carisoprodol (SOMA) 350 MG tablet    Sig: TAKE ONE TABLET BY MOUTH FOUR TIMES DAILY AS NEEDED FOR MUSCLE SPASMS.    Dispense:  120 tablet    Refill:  3     [name], M.D.  Primary Care at [facility] [address] [place], [place] [address] [phone] phone [phone] fax  06/21/17 12:44 AM

## 2017-10-10 ENCOUNTER — Other Ambulatory Visit: Payer: Self-pay

## 2017-10-10 ENCOUNTER — Ambulatory Visit (INDEPENDENT_AMBULATORY_CARE_PROVIDER_SITE_OTHER): Payer: Self-pay | Admitting: Family Medicine

## 2017-10-10 ENCOUNTER — Encounter: Payer: Self-pay | Admitting: Family Medicine

## 2017-10-10 VITALS — BP 118/76 | HR 65 | Temp 98.0°F | Resp 16 | Ht 71.26 in | Wt 186.4 lb

## 2017-10-10 DIAGNOSIS — G8929 Other chronic pain: Secondary | ICD-10-CM

## 2017-10-10 DIAGNOSIS — E1142 Type 2 diabetes mellitus with diabetic polyneuropathy: Secondary | ICD-10-CM

## 2017-10-10 DIAGNOSIS — R0789 Other chest pain: Secondary | ICD-10-CM

## 2017-10-10 DIAGNOSIS — M5136 Other intervertebral disc degeneration, lumbar region: Secondary | ICD-10-CM

## 2017-10-10 DIAGNOSIS — M51369 Other intervertebral disc degeneration, lumbar region without mention of lumbar back pain or lower extremity pain: Secondary | ICD-10-CM

## 2017-10-10 DIAGNOSIS — R5381 Other malaise: Secondary | ICD-10-CM

## 2017-10-10 DIAGNOSIS — I10 Essential (primary) hypertension: Secondary | ICD-10-CM

## 2017-10-10 DIAGNOSIS — G894 Chronic pain syndrome: Secondary | ICD-10-CM

## 2017-10-10 DIAGNOSIS — M4714 Other spondylosis with myelopathy, thoracic region: Secondary | ICD-10-CM

## 2017-10-10 MED ORDER — FENTANYL 75 MCG/HR TD PT72: 75.0000 ug | MEDICATED_PATCH | TRANSDERMAL | 0 refills | Status: DC

## 2017-10-10 MED ORDER — ATENOLOL 100 MG PO TABS: 100.0000 mg | ORAL_TABLET | Freq: Every day | ORAL | 3 refills | Status: DC

## 2017-10-10 MED ORDER — OXYCODONE HCL 10 MG PO TABS: 10.0000 mg | ORAL_TABLET | ORAL | 0 refills | Status: DC | PRN

## 2017-10-10 MED ORDER — LOSARTAN POTASSIUM-HCTZ 100-25 MG PO TABS: 1.0000 | ORAL_TABLET | Freq: Every day | ORAL | 1 refills | Status: DC

## 2017-10-10 MED ORDER — METFORMIN HCL 1000 MG PO TABS: 1000.0000 mg | ORAL_TABLET | Freq: Two times a day (BID) | ORAL | 0 refills | Status: DC

## 2017-10-10 MED ORDER — CARISOPRODOL 350 MG PO TABS: ORAL_TABLET | ORAL | 3 refills | Status: DC

## 2017-10-10 NOTE — Patient Instructions (Signed)
     IF you received an x-ray today, you will receive an invoice from [facility]. Please contact [facility] at [phone] with questions or concerns regarding your invoice.   IF you received labwork today, you will receive an invoice from [facility]. Please contact [facility] at [phone] with questions or concerns regarding your invoice.   Our billing staff will not be able to assist you with questions regarding bills from these companies.  You will be contacted with the lab results as soon as they are available. The fastest way to get your results is to activate your My Chart account. Instructions are located on the last page of this paperwork. If you have not heard from us regarding the results in 2 weeks, please contact this office.     

## 2017-10-10 NOTE — Progress Notes (Signed)
Subjective:  By signing my name below, I, Robert Burnett, attest that this documentation has been prepared under the direction and in the presence of Robert Cheadle, MD. Electronically Signed: [name], [place]. 10/10/2017 , 11:27 AM .  Patient was seen in Room 1 .   Patient ID: Robert Burnett, male    DOB: [birth year], 56 y.o.   MRN: [id number] Chief Complaint  Patient presents with  . Medication Refill    fentanyl patch, oxycodone, soma   HPI Robert Burnett is a 56 y.o. male who presents to Primary Care at [facility] requesting medication refill.   HTN He checks his BP at home, last checked 2 nights ago at 128-130/86. He takes Losartan-HCTZ 100-25mg  QD.   Cyst of foot He mentions cyst popping on the bottom of his left foot about 2 months ago. He describes pus expressed as "stringy" from the cyst. He applied peroxide and antibiotic ointment over the area for about 3 days.   GERD He took protonix for reflux, but hasn't needed it for about a month recently.   Diabetes He occasionally checks his Burnett sugar, last checked 2 days ago at 97. He states highest number recently up to 128. He denies any symptomatic lows. He mentions not taking his metformin everyday. He recently drank more sweet tea over the past 2-3 days during the day; although, he's decreased his soda intake.   Past Medical History:  Diagnosis Date  . Allergy   . Arthritis   . Diabetes mellitus    toe amp 4/12  . Hyperlipidemia   . Hypertension    Past Surgical History:  Procedure Laterality Date  . [place]   Prior to Admission medications   Medication Sig Start Date End Date Taking? Authorizing Provider  aspirin 81 MG tablet Take 81 mg by mouth daily.    [provider]  atenolol (TENORMIN) 100 MG tablet Take 1 tablet (100 mg total) by mouth daily. 10/27/16   [name], MD  carisoprodol (SOMA) 350 MG tablet TAKE ONE TABLET BY MOUTH FOUR TIMES DAILY AS NEEDED FOR MUSCLE SPASMS.  06/18/17   [name], MD  fentaNYL (DURAGESIC - DOSED MCG/HR) 75 MCG/HR Place 1 patch (75 mcg total) onto the skin every 3 (three) days. May fill 30 days after script. 07/18/17   [name], MD  fentaNYL (DURAGESIC) 75 MCG/HR Place 1 patch (75 mcg total) onto the skin every 3 (three) days. May fill 60 days after script. 08/16/17   [name], MD  fentaNYL (DURAGESIC) 75 MCG/HR Place 1 patch (75 mcg total) onto the skin every 3 (three) days. May fill 90 days after script. 09/15/17   [name], MD  fentaNYL (DURAGESIC) 75 MCG/HR Place 1 patch (75 mcg total) onto the skin every 3 (three) days. 06/18/17   [name], MD  fluticasone (FLONASE) 50 MCG/ACT nasal spray Place 2 sprays into the nose daily.    [provider]  losartan-hydrochlorothiazide (HYZAAR) 100-25 MG tablet Take 1 tablet by mouth daily. 02/20/17   [name], MD  metFORMIN (GLUCOPHAGE) 1000 MG tablet Take 1 tablet (1,000 mg total) by mouth 2 (two) times daily with a meal. 10/27/16   [name], MD  Oxycodone HCl 10 MG TABS Take 1 tablet (10 mg total) by mouth every 4 (four) hours as needed (pain). May fill 30 days after script. 07/18/17   [name], MD  Oxycodone HCl 10  MG TABS Take 1 tablet (10 mg total) by mouth every 4 (four) hours as needed. May fill 90 days after script. 09/15/17   [name], MD  Oxycodone HCl 10 MG TABS Take 1 tablet (10 mg total) by mouth every 4 (four) hours as needed (pain). May fill 60 days after script 08/16/17   [name], MD  Oxycodone HCl 10 MG TABS Take 1 tablet (10 mg total) by mouth every 4 (four) hours as needed (pain). 06/18/17   [name], MD  pantoprazole (PROTONIX) 40 MG tablet TAKE 1 TABLET BY MOUTH ONCE A DAY 08/29/16   [name], MD   Allergies  Allergen Reactions  . Prozac [Fluoxetine Hcl]   . Amlodipine Rash   Family History  Problem Relation Age of Onset  . Hypertension Father   . Diabetes Father   . Cancer Father   . Diabetes Sister   . Cancer Sister   . Cancer  Mother    Social History   Socioeconomic History  . Marital status: Single    Spouse name: None  . Number of children: None  . Years of education: None  . Highest education level: None  Social Needs  . Financial resource strain: None  . Food insecurity - worry: None  . Food insecurity - inability: None  . Transportation needs - medical: None  . Transportation needs - non-medical: None  Occupational History  . None  Tobacco Use  . Smoking status: Former Smoker    Packs/day: 2.00    Years: 5.00    Pack years: 10.00    Types: Cigarettes    Last attempt to quit: 10/20/1998    Years since quitting: 18.9  . Smokeless tobacco: Former Systems developer  . Tobacco comment: used chewing tobacco for about a year at age 82  Substance and Sexual Activity  . Alcohol use: No  . Drug use: No  . Sexual activity: None  Other Topics Concern  . None  Social History Narrative  . None   Depression screen [facility] 2/9 10/10/2017 06/18/2017 02/20/2017 10/27/2016 06/27/2016  Decreased Interest 0 0 0 0 0  Down, Depressed, Hopeless 0 0 0 0 0  PHQ - 2 Score 0 0 0 0 0  Altered sleeping - - - - -  Tired, decreased energy - - - - -  Change in appetite - - - - -  Feeling bad or failure about yourself  - - - - -  Trouble concentrating - - - - -  Moving slowly or fidgety/restless - - - - -  Suicidal thoughts - - - - -  PHQ-9 Score - - - - -    Review of Systems  Constitutional: Negative for fatigue and unexpected weight change.  Eyes: Negative for visual disturbance.  Respiratory: Negative for cough, chest tightness and shortness of breath.   Cardiovascular: Negative for chest pain, palpitations and leg swelling.  Gastrointestinal: Negative for abdominal pain and Burnett in stool.  Neurological: Negative for dizziness, light-headedness and headaches.       Objective:   Physical Exam  Constitutional: He is oriented to person, place, and time. He appears well-developed and well-nourished. No distress.  HENT:  Head:  Normocephalic and atraumatic.  Eyes: EOM are normal. Pupils are equal, round, and reactive to light.  Neck: Neck supple.  Cardiovascular: Normal rate, regular rhythm, S1 normal, S2 normal and normal heart sounds.  No murmur heard. Pulmonary/Chest: Effort normal and breath sounds normal. No respiratory  distress. He has no wheezes.  Musculoskeletal: Normal range of motion.  Neurological: He is alert and oriented to person, place, and time.  Skin: Skin is warm and dry.  Psychiatric: He has a normal mood and affect. His behavior is normal.  Nursing note and vitals reviewed.   Pulse 65   Temp 98 F (36.7 C)   Resp 16   Ht 5' 11.26" (1.81 m)   Wt 186 lb 6.4 oz (84.6 kg)   SpO2 100%   BMI 25.81 kg/m      Assessment & Plan:    1. Type 2 diabetes mellitus with peripheral neuropathy (HCC) cbgs at home well controlled. Pt declined a1c and cmp today due to cost but agrees to do at next f/u OV in 4 mos.   2. Essential hypertension - well controlled, cont current regimen  3. Chronic pain syndrome - refilled all meds for pt to fill on 10/10/17, 11/09/17, 12/07/17, 01/04/18  4. Debility   5. Chest wall pain, chronic   6. Lumbar degenerative disc disease   7. Thoracic spondylosis with myelopathy    Today I have utilized the [place] Controlled Substance Registry's online query to confirm compliance regarding the patient's controlled medications. My review reveals appropriate prescription fills and that I am the sole provider of these medications. Rechecks will occur regularly and the patient is aware of our use of the system.  Try Legal-aide to help get insurance through affordable care act.  Meds ordered this encounter  Medications  . Oxycodone HCl 10 MG TABS    Sig: Take 1 tablet (10 mg total) by mouth every 4 (four) hours as needed.    Dispense:  150 tablet    Refill:  0  . Oxycodone HCl 10 MG TABS    Sig: Take 1 tablet (10 mg total) by mouth every 4 (four) hours as needed (pain).     Dispense:  150 tablet    Refill:  0  . Oxycodone HCl 10 MG TABS    Sig: Take 1 tablet (10 mg total) by mouth every 4 (four) hours as needed (pain).    Dispense:  150 tablet    Refill:  0  . Oxycodone HCl 10 MG TABS    Sig: Take 1 tablet (10 mg total) by mouth every 4 (four) hours as needed (pain).    Dispense:  150 tablet    Refill:  0  . atenolol (TENORMIN) 100 MG tablet    Sig: Take 1 tablet (100 mg total) by mouth daily.    Dispense:  90 tablet    Refill:  3  . fentaNYL (DURAGESIC - DOSED MCG/HR) 75 MCG/HR    Sig: Place 1 patch (75 mcg total) onto the skin every 3 (three) days.    Dispense:  10 patch    Refill:  0  . fentaNYL (DURAGESIC) 75 MCG/HR    Sig: Place 1 patch (75 mcg total) onto the skin every 3 (three) days.    Dispense:  10 patch    Refill:  0  . fentaNYL (DURAGESIC) 75 MCG/HR    Sig: Place 1 patch (75 mcg total) onto the skin every 3 (three) days.    Dispense:  10 patch    Refill:  0  . fentaNYL (DURAGESIC) 75 MCG/HR    Sig: Place 1 patch (75 mcg total) onto the skin every 3 (three) days.    Dispense:  10 patch    Refill:  0  . carisoprodol (SOMA) 350 MG  tablet    Sig: TAKE ONE TABLET BY MOUTH FOUR TIMES DAILY AS NEEDED FOR MUSCLE SPASMS.    Dispense:  120 tablet    Refill:  3    May fill no sooner than every 28 days  . losartan-hydrochlorothiazide (HYZAAR) 100-25 MG tablet    Sig: Take 1 tablet by mouth daily.    Dispense:  90 tablet    Refill:  1  . metFORMIN (GLUCOPHAGE) 1000 MG tablet    Sig: Take 1 tablet (1,000 mg total) by mouth 2 (two) times daily with a meal.    Dispense:  180 tablet    Refill:  0    I personally performed the services described in this documentation, which was scribed in my presence. The recorded information has been reviewed and considered, and addended by me as needed.   Robert Burnett, M.D.  Primary Care at [facility] [address] [place], [place] [address] [phone] phone [phone] fax  10/18/17 12:26  AM

## 2017-10-18 ENCOUNTER — Encounter: Payer: Self-pay | Admitting: Family Medicine

## 2017-10-22 ENCOUNTER — Ambulatory Visit: Payer: Self-pay | Admitting: Family Medicine

## 2018-01-28 ENCOUNTER — Ambulatory Visit: Payer: Self-pay | Admitting: Family Medicine

## 2018-02-01 ENCOUNTER — Encounter: Payer: Self-pay | Admitting: Family Medicine

## 2018-02-01 ENCOUNTER — Other Ambulatory Visit: Payer: Self-pay

## 2018-02-01 ENCOUNTER — Ambulatory Visit (INDEPENDENT_AMBULATORY_CARE_PROVIDER_SITE_OTHER): Payer: Self-pay | Admitting: Family Medicine

## 2018-02-01 VITALS — BP 130/74 | HR 54 | Temp 98.0°F | Resp 18 | Ht 71.26 in | Wt 180.8 lb

## 2018-02-01 DIAGNOSIS — E1142 Type 2 diabetes mellitus with diabetic polyneuropathy: Secondary | ICD-10-CM

## 2018-02-01 DIAGNOSIS — G894 Chronic pain syndrome: Secondary | ICD-10-CM

## 2018-02-01 LAB — COMPREHENSIVE METABOLIC PANEL
A/G RATIO: 1.7 (ref 1.2–2.2)
ALT: 6 IU/L (ref 0–44)
AST: 10 IU/L (ref 0–40)
Albumin: 4.3 g/dL (ref 3.5–5.5)
Alkaline Phosphatase: 79 IU/L (ref 39–117)
BILIRUBIN TOTAL: 0.6 mg/dL (ref 0.0–1.2)
BUN/Creatinine Ratio: 17 (ref 9–20)
BUN: 17 mg/dL (ref 6–24)
CHLORIDE: 98 mmol/L (ref 96–106)
CO2: 26 mmol/L (ref 20–29)
Calcium: 9.5 mg/dL (ref 8.7–10.2)
Creatinine, Ser: 1.03 mg/dL (ref 0.76–1.27)
GFR, EST AFRICAN AMERICAN: 93 mL/min/{1.73_m2} (ref >59)
GFR, EST NON AFRICAN AMERICAN: 80 mL/min/{1.73_m2} (ref >59)
GLOBULIN, TOTAL: 2.5 g/dL (ref 1.5–4.5)
Glucose: 118 mg/dL — ABNORMAL HIGH (ref 65–99)
POTASSIUM: 3.6 mmol/L (ref 3.5–5.2)
SODIUM: 140 mmol/L (ref 134–144)
TOTAL PROTEIN: 6.8 g/dL (ref 6.0–8.5)

## 2018-02-01 LAB — POCT GLYCOSYLATED HEMOGLOBIN (HGB A1C): Hemoglobin A1C: 5.7

## 2018-02-01 MED ORDER — FENTANYL 75 MCG/HR TD PT72: 75.0000 ug | MEDICATED_PATCH | TRANSDERMAL | 0 refills | Status: DC

## 2018-02-01 MED ORDER — FENTANYL 75 MCG/HR TD PT72
75.0000 ug | MEDICATED_PATCH | TRANSDERMAL | 0 refills | Status: DC
Start: 2018-02-01 — End: 2018-05-24

## 2018-02-01 MED ORDER — OXYCODONE HCL 10 MG PO TABS: 10.0000 mg | ORAL_TABLET | ORAL | 0 refills | Status: DC | PRN

## 2018-02-01 MED ORDER — CARISOPRODOL 350 MG PO TABS: ORAL_TABLET | ORAL | 3 refills | Status: DC

## 2018-02-01 MED ORDER — LOSARTAN POTASSIUM-HCTZ 100-25 MG PO TABS: 1.0000 | ORAL_TABLET | Freq: Every day | ORAL | 1 refills | Status: DC

## 2018-02-01 MED ORDER — OXYCODONE HCL 10 MG PO TABS
10.0000 mg | ORAL_TABLET | ORAL | 0 refills | Status: DC | PRN
Start: 2018-03-29 — End: 2018-05-24

## 2018-02-01 NOTE — Progress Notes (Signed)
By signing my name below, I, [name], attest that this documentation has been prepared under the direction and in the presence of Dr. Laurey Arrow. [name]. Electronically Signed: Baldwin Jamaica, Medical Scribe 02/01/2018 at 12:33 PM.  Subjective:    Patient ID: Robert Burnett, male    DOB: 1961/08/14, 57 y.o.   MRN: [id number] Chief Complaint  Patient presents with  . Diabetes  . Hypertension  . Pain  . Follow-up    HPI Robert Burnett is a 57 y.o. male who presents to Primary Care at [facility] for a routine follow up regarding his diabetes and blood pressure management.   He notes that he has lost some weight recently and notes that he has been eating less than he used to. He cites that his chronic chest wall pain decreases the amount of food he feels comfortable eating.  Diabetes Management: He has been taking Metformin twice a day. He notes that his blood sugars have been as high as 125 recently, after taking a half of a Metformin.   Heartburn/Indigestion: He also notes reduced heartburn and indigestion even after stopping Protonix, and now uses OTC anti-acid as needed.   Blood Pressure: He also notes that his blood pressure has been doing better, averaging a systolic measurement of 222.   Plantar Cyst: He denies any redness with his plantar cyst. He shaves off the dried, dead skin routinely without any trouble.  Pain Management: He notes that his pain medication has helped to manage his pain significantly. He denies any associated constipation or trouble sleeping.    Patient Active Problem List   Diagnosis Date Noted  . Fatty liver disease, nonalcoholic 97/98/9211  . Hypertension 06/27/2016  . Thoracic spondylosis with myelopathy 03/16/2013  . Carpal tunnel syndrome 03/16/2013  . Chest wall pain, chronic 03/16/2013  . Type 2 diabetes mellitus with peripheral neuropathy ([place]) 03/16/2013  . Debility 03/16/2013  . Chronic pain syndrome 03/16/2013  . Lumbar degenerative disc  disease 12/12/2011  . Costochondritis 12/12/2011  . Diabetic peripheral neuropathy associated with type 2 diabetes mellitus ([place]) 12/12/2011   Past Medical History:  Diagnosis Date  . Allergy   . Arthritis   . Diabetes mellitus    toe amp 4/12  . Hyperlipidemia   . Hypertension    Past Surgical History:  Procedure Laterality Date  . KNEE SURGERY  1975 and 1989   Allergies  Allergen Reactions  . Prozac [Fluoxetine Hcl]   . Amlodipine Rash   Prior to Admission medications   Medication Sig Start Date End Date Taking? Authorizing Provider  aspirin 81 MG tablet Take 81 mg by mouth daily.   Yes [provider]  atenolol (TENORMIN) 100 MG tablet Take 1 tablet (100 mg total) by mouth daily. 10/10/17  Yes [name], MD  carisoprodol (SOMA) 350 MG tablet TAKE ONE TABLET BY MOUTH FOUR TIMES DAILY AS NEEDED FOR MUSCLE SPASMS. 10/10/17  Yes [name], MD  fentaNYL (DURAGESIC - DOSED MCG/HR) 75 MCG/HR Place 1 patch (75 mcg total) onto the skin every 3 (three) days. 11/09/17  Yes [name], MD  fentaNYL (DURAGESIC) 75 MCG/HR Place 1 patch (75 mcg total) onto the skin every 3 (three) days. 12/07/17  Yes [name], MD  fentaNYL (DURAGESIC) 75 MCG/HR Place 1 patch (75 mcg total) onto the skin every 3 (three) days. 01/04/18  Yes [name], MD  fentaNYL (DURAGESIC) 75 MCG/HR Place 1 patch (75 mcg total) onto the skin every 3 (  three) days. 10/10/17  Yes [name], MD  fluticasone [facility]) 50 MCG/ACT nasal spray Place 2 sprays into the nose daily.   Yes [provider]  losartan-hydrochlorothiazide (HYZAAR) 100-25 MG tablet Take 1 tablet by mouth daily. 10/10/17  Yes [name], MD  metFORMIN (GLUCOPHAGE) 1000 MG tablet Take 1 tablet (1,000 mg total) by mouth 2 (two) times daily with a meal. 10/10/17  Yes [name], MD  Oxycodone HCl 10 MG TABS Take 1 tablet (10 mg total) by mouth every 4 (four) hours as needed. 01/04/18  Yes [name], MD  Oxycodone HCl 10 MG TABS Take 1  tablet (10 mg total) by mouth every 4 (four) hours as needed (pain). 12/07/17  Yes [name], MD  Oxycodone HCl 10 MG TABS Take 1 tablet (10 mg total) by mouth every 4 (four) hours as needed (pain). 10/10/17  Yes [name], MD  Oxycodone HCl 10 MG TABS Take 1 tablet (10 mg total) by mouth every 4 (four) hours as needed (pain). 11/09/17  Yes [name], MD  pantoprazole (PROTONIX) 40 MG tablet TAKE 1 TABLET BY MOUTH ONCE A DAY 08/29/16  Yes [name], MD   Social History   Socioeconomic History  . Marital status: Single    Spouse name: Not on file  . Number of children: Not on file  . Years of education: Not on file  . Highest education level: Not on file  Occupational History  . Not on file  Social Needs  . Financial resource strain: Not on file  . Food insecurity:    Worry: Not on file    Inability: Not on file  . Transportation needs:    Medical: Not on file    Non-medical: Not on file  Tobacco Use  . Smoking status: Former Smoker    Packs/day: 2.00    Years: 5.00    Pack years: 10.00    Types: Cigarettes    Last attempt to quit: 10/20/1998    Years since quitting: 19.2  . Smokeless tobacco: Former Systems developer  . Tobacco comment: used chewing tobacco for about a year at age 24  Substance and Sexual Activity  . Alcohol use: No  . Drug use: No  . Sexual activity: Not on file  Lifestyle  . Physical activity:    Days per week: Not on file    Minutes per session: Not on file  . Stress: Not on file  Relationships  . Social connections:    Talks on phone: Not on file    Gets together: Not on file    Attends religious service: Not on file    Active member of club or organization: Not on file    Attends meetings of clubs or organizations: Not on file    Relationship status: Not on file  . Intimate partner violence:    Fear of current or ex partner: Not on file    Emotionally abused: Not on file    Physically abused: Not on file    Forced sexual activity: Not on file  Other  Topics Concern  . Not on file  Social History Narrative  . Not on file    Review of Systems  Constitutional: Negative for fatigue, fever and unexpected weight change.  Cardiovascular: Positive for chest pain.  Gastrointestinal: Positive for abdominal pain. Negative for constipation.  Musculoskeletal: Positive for back pain.   Negative for heartburn and indigestion.  Objective:   Physical Exam  Constitutional: He is oriented to person, place, and time. He appears well-developed and well-nourished.  HENT:  Head: Normocephalic and atraumatic.  Cardiovascular: Normal rate and regular rhythm.  Pulmonary/Chest: Effort normal and breath sounds normal.  Neurological: He is alert and oriented to person, place, and time.  Skin: Skin is warm and dry.   Vitals:   02/01/18 1155  BP: 130/74  Pulse: (!) 54  Resp: 18  Temp: 98 F (36.7 C)  TempSrc: Oral  SpO2: 100%  Weight: 180 lb 12.8 oz (82 kg)  Height: 5' 11.26" (1.81 m)          Results for orders placed or performed in visit on 02/01/18  POCT glycosylated hemoglobin (Hb A1C)  Result Value Ref Range   Hemoglobin A1C 5.7    Assessment & Plan:  Today I have utilized the [place] Controlled Substance Registry's online query to confirm compliance regarding the patient's controlled medications. My review reveals appropriate prescription fills and that I am the sole provider of these medications. Rechecks will occur regularly and the patient is aware of our use of the system.  1. Type 2 diabetes mellitus with peripheral neuropathy ([place])   2. Chronic pain syndrome     Orders Placed This Encounter  Procedures  . Comprehensive metabolic panel  . POCT glycosylated hemoglobin (Hb A1C)    Meds ordered this encounter  Medications  . carisoprodol (SOMA) 350 MG tablet    Sig: TAKE ONE TABLET BY MOUTH FOUR TIMES DAILY AS NEEDED FOR MUSCLE SPASMS.    Dispense:  120 tablet    Refill:  3    May fill no sooner than every 28 days  .  fentaNYL (DURAGESIC) 75 MCG/HR    Sig: Place 1 patch (75 mcg total) onto the skin every 3 (three) days.    Dispense:  10 patch    Refill:  0    Chronic pain syndrome  . fentaNYL (DURAGESIC) 75 MCG/HR    Sig: Place 1 patch (75 mcg total) onto the skin every 3 (three) days.    Dispense:  10 patch    Refill:  0    Chronic pain syndrome  . fentaNYL (DURAGESIC) 75 MCG/HR    Sig: Place 1 patch (75 mcg total) onto the skin every 3 (three) days.    Dispense:  10 patch    Refill:  0    Chronic pain syndrome  . fentaNYL (DURAGESIC - DOSED MCG/HR) 75 MCG/HR    Sig: Place 1 patch (75 mcg total) onto the skin every 3 (three) days.    Dispense:  10 patch    Refill:  0    Chronic pain syndrome  . Oxycodone HCl 10 MG TABS    Sig: Take 1 tablet (10 mg total) by mouth every 4 (four) hours as needed.    Dispense:  150 tablet    Refill:  0    Chronic pain syndrome  . Oxycodone HCl 10 MG TABS    Sig: Take 1 tablet (10 mg total) by mouth every 4 (four) hours as needed (pain).    Dispense:  150 tablet    Refill:  0    Chronic pain syndrome  . Oxycodone HCl 10 MG TABS    Sig: Take 1 tablet (10 mg total) by mouth every 4 (four) hours as needed (pain).    Dispense:  150 tablet    Refill:  0    Chronic pain syndrome  . Oxycodone  HCl 10 MG TABS    Sig: Take 1 tablet (10 mg total) by mouth every 4 (four) hours as needed (pain).    Dispense:  150 tablet    Refill:  0    Chronic pain syndrome  . losartan-hydrochlorothiazide (HYZAAR) 100-25 MG tablet    Sig: Take 1 tablet by mouth daily.    Dispense:  90 tablet    Refill:  1    I personally performed the services described in this documentation, which was scribed in my presence. The recorded information has been reviewed and considered, and addended by me as needed.   [name], M.D.  Primary Care at [facility] [address] [place], [place] [address] [phone] phone [phone] fax  07/13/18 11:58 AM

## 2018-02-01 NOTE — Patient Instructions (Signed)
     IF you received an x-ray today, you will receive an invoice from [facility]. Please contact [facility] at [phone] with questions or concerns regarding your invoice.   IF you received labwork today, you will receive an invoice from [facility]. Please contact [facility] at [phone] with questions or concerns regarding your invoice.   Our billing staff will not be able to assist you with questions regarding bills from these companies.  You will be contacted with the lab results as soon as they are available. The fastest way to get your results is to activate your My Chart account. Instructions are located on the last page of this paperwork. If you have not heard from us regarding the results in 2 weeks, please contact this office.     

## 2018-05-21 ENCOUNTER — Telehealth: Payer: Self-pay | Admitting: Family Medicine

## 2018-05-21 NOTE — Telephone Encounter (Signed)
Tried to call pt to inform them of the building number for their appt with Dr. [name] on 05/27/18. Number has been disconnected or changed.

## 2018-05-24 ENCOUNTER — Other Ambulatory Visit: Payer: Self-pay

## 2018-05-24 ENCOUNTER — Encounter: Payer: Self-pay | Admitting: Family Medicine

## 2018-05-24 ENCOUNTER — Ambulatory Visit (INDEPENDENT_AMBULATORY_CARE_PROVIDER_SITE_OTHER): Payer: Self-pay | Admitting: Family Medicine

## 2018-05-24 VITALS — BP 116/78 | HR 70 | Temp 99.0°F | Resp 17 | Ht 71.26 in | Wt 174.6 lb

## 2018-05-24 DIAGNOSIS — Z1211 Encounter for screening for malignant neoplasm of colon: Secondary | ICD-10-CM

## 2018-05-24 DIAGNOSIS — G894 Chronic pain syndrome: Secondary | ICD-10-CM

## 2018-05-24 DIAGNOSIS — Z1212 Encounter for screening for malignant neoplasm of rectum: Secondary | ICD-10-CM

## 2018-05-24 MED ORDER — FENTANYL 75 MCG/HR TD PT72
75.0000 ug | MEDICATED_PATCH | TRANSDERMAL | 0 refills | Status: DC
Start: 2018-08-16 — End: 2018-09-13

## 2018-05-24 MED ORDER — OXYCODONE HCL 10 MG PO TABS: 10.0000 mg | ORAL_TABLET | ORAL | 0 refills | Status: DC | PRN

## 2018-05-24 MED ORDER — FENTANYL 75 MCG/HR TD PT72: 75.0000 ug | MEDICATED_PATCH | TRANSDERMAL | 0 refills | Status: DC

## 2018-05-24 MED ORDER — CARISOPRODOL 350 MG PO TABS: ORAL_TABLET | ORAL | 3 refills | Status: DC

## 2018-05-24 NOTE — Patient Instructions (Signed)
     IF you received an x-ray today, you will receive an invoice from [facility]. Please contact [facility] at [phone] with questions or concerns regarding your invoice.   IF you received labwork today, you will receive an invoice from [facility]. Please contact [facility] at [phone] with questions or concerns regarding your invoice.   Our billing staff will not be able to assist you with questions regarding bills from these companies.  You will be contacted with the lab results as soon as they are available. The fastest way to get your results is to activate your My Chart account. Instructions are located on the last page of this paperwork. If you have not heard from us regarding the results in 2 weeks, please contact this office.     

## 2018-05-24 NOTE — Progress Notes (Signed)
Subjective:    Patient ID: Robert Burnett, male    DOB: [birth year], 57 y.o.   MRN: [id number] Chief Complaint  Patient presents with  . Medication Refill    follow up and refills on atenolol, carisoprodol, fentanyl, fluticasone, lasartan-hctz, oxycodone    HPI  Everything is about the same.  He is no longer having to take the metformin.  His cbg when he last checked it was 110-120 during the day and at night. He is not eating as much as it hurts his ribs and his chest if he eats to much. The pain is getting worse as time goes on and so he can't eat as much as he used to.  No night sweats. Normal energy levels - pain really zaps it out of him.  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 Bowels are normal color. If he eats very much, he feels like there is a giant ball that is pushing up again his innards and his ribs.  Not having to take protonix but does takes a tums occ. He checks BP outside office and usually he skips a day between taking his BP meds together. He did take his meds this a.m. About 6 am When he eats it hurts his central sternum since last 2001 to early 2002  Past Medical History:  Diagnosis Date  . Allergy   . Arthritis   . Diabetes mellitus    toe amp 4/12  . Hyperlipidemia   . Hypertension    Past Surgical History:  Procedure Laterality Date  . KNEE SURGERY  1975 and 1989   Current Outpatient Medications on File Prior to Visit  Medication Sig Dispense Refill  . aspirin 81 MG tablet Take 81 mg by mouth daily.    Marland Kitchen atenolol (TENORMIN) 100 MG tablet Take 1 tablet (100 mg total) by mouth daily. 90 tablet 3  . fluticasone (FLONASE) 50 MCG/ACT nasal spray Place 2 sprays into the nose daily.    Marland Kitchen losartan-hydrochlorothiazide (HYZAAR) 100-25 MG tablet Take 1 tablet by mouth daily. 90 tablet 1   No current facility-administered medications on file prior to visit.    Allergies  Allergen Reactions   . Prozac [Fluoxetine Hcl]   . Amlodipine Rash   Family History  Problem Relation Age of Onset  . Hypertension Father   . Diabetes Father   . Cancer Father   . Diabetes Sister   . Cancer Sister   . Cancer Mother    Social History   Socioeconomic History  . Marital status: Single    Spouse name: Not on file  . Number of children: Not on file  . Years of education: Not on file  . Highest education level: Not on file  Occupational History  . Not on file  Social Needs  . Financial resource strain: Not on file  . Food insecurity:    Worry: Not on file    Inability: Not on file  . Transportation needs:    Medical: Not on file    Non-medical: Not on file  Tobacco Use  . Smoking status: Former Smoker    Packs/day: 2.00    Years: 5.00    Pack years: 10.00    Types: Cigarettes    Last attempt to quit: 10/20/1998    Years since quitting: 19.7  . Smokeless tobacco: Former Systems developer  . Tobacco comment: used chewing tobacco  for about a year at age 54  Substance and Sexual Activity  . Alcohol use: No  . Drug use: No  . Sexual activity: Not on file  Lifestyle  . Physical activity:    Days per week: Not on file    Minutes per session: Not on file  . Stress: Not on file  Relationships  . Social connections:    Talks on phone: Not on file    Gets together: Not on file    Attends religious service: Not on file    Active member of club or organization: Not on file    Attends meetings of clubs or organizations: Not on file    Relationship status: Not on file  Other Topics Concern  . Not on file  Social History Narrative  . Not on file   Depression screen [facility] 2/9 05/24/2018 02/01/2018 10/10/2017 06/18/2017 02/20/2017  Decreased Interest 0 0 0 0 0  Down, Depressed, Hopeless 0 0 0 0 0  PHQ - 2 Score 0 0 0 0 0  Altered sleeping - - - - -  Tired, decreased energy - - - - -  Change in appetite - - - - -  Feeling bad or failure about yourself  - - - - -  Trouble concentrating - - - - -    Moving slowly or fidgety/restless - - - - -  Suicidal thoughts - - - - -  PHQ-9 Score - - - - -      Review of Systems See hpi    Objective:   Physical Exam  Constitutional: He is oriented to person, place, and time. He appears well-developed and well-nourished. No distress.  HENT:  Head: Normocephalic and atraumatic.  Eyes: Pupils are equal, round, and reactive to light. Conjunctivae are normal. No scleral icterus.  Neck: Normal range of motion. Neck supple. No thyromegaly present.  Cardiovascular: Normal rate, regular rhythm, normal heart sounds and intact distal pulses.  Pulmonary/Chest: Effort normal and breath sounds normal. No respiratory distress.  Musculoskeletal: He exhibits no edema.  Lymphadenopathy:    He has no cervical adenopathy.  Neurological: He is alert and oriented to person, place, and time.  Skin: Skin is warm and dry. He is not diaphoretic.  Psychiatric: He has a normal mood and affect. His behavior is normal.   BP 116/78 (BP Location: Right Arm, Patient Position: Sitting, Cuff Size: Normal)   Pulse 70   Temp 99 F (37.2 C) (Oral)   Resp 17   Ht 5' 11.26" (1.81 m)   Wt 174 lb 9.6 oz (79.2 kg)   SpO2 98%   BMI 24.17 kg/m      Assessment & Plan:   1. Chronic pain syndrome - stable, meds refilled  2. Screening for colorectal cancer - self-pay but agrees to home FOBT x 3    Orders Placed This Encounter  Procedures  . POC Hemoccult Bld/Stl (3-Cd Home Screen)    Standing Status:   Future    Standing Expiration Date:   05/25/2019    Meds ordered this encounter  Medications  . Oxycodone HCl 10 MG TABS    Sig: Take 1 tablet (10 mg total) by mouth every 4 (four) hours as needed (pain).    Dispense:  150 tablet    Refill:  0    Chronic pain syndrome  . Oxycodone HCl 10 MG TABS    Sig: Take 1 tablet (10 mg total) by mouth every 4 (four) hours as needed (  pain).    Dispense:  150 tablet    Refill:  0    Chronic pain syndrome  . Oxycodone HCl 10  MG TABS    Sig: Take 1 tablet (10 mg total) by mouth every 4 (four) hours as needed.    Dispense:  150 tablet    Refill:  0    Chronic pain syndrome  . Oxycodone HCl 10 MG TABS    Sig: Take 1 tablet (10 mg total) by mouth every 4 (four) hours as needed (pain).    Dispense:  150 tablet    Refill:  0    Chronic pain syndrome  . carisoprodol (SOMA) 350 MG tablet    Sig: TAKE ONE TABLET BY MOUTH FOUR TIMES DAILY AS NEEDED FOR MUSCLE SPASMS.    Dispense:  120 tablet    Refill:  3    May fill no sooner than every 28 days  . fentaNYL (DURAGESIC - DOSED MCG/HR) 75 MCG/HR    Sig: Place 1 patch (75 mcg total) onto the skin every 3 (three) days.    Dispense:  10 patch    Refill:  0    Chronic pain syndrome  . fentaNYL (DURAGESIC) 75 MCG/HR    Sig: Place 1 patch (75 mcg total) onto the skin every 3 (three) days.    Dispense:  10 patch    Refill:  0    Chronic pain syndrome  . fentaNYL (DURAGESIC) 75 MCG/HR    Sig: Place 1 patch (75 mcg total) onto the skin every 3 (three) days.    Dispense:  10 patch    Refill:  0    Chronic pain syndrome  . fentaNYL (DURAGESIC) 75 MCG/HR    Sig: Place 1 patch (75 mcg total) onto the skin every 3 (three) days.    Dispense:  10 patch    Refill:  0    Chronic pain syndrome    Today I have utilized the [place] Controlled Substance Registry's online query to confirm compliance regarding the patient's controlled medications. My review reveals appropriate prescription fills and that I am the sole provider of these medications. Rechecks will occur regularly and the patient is aware of our use of the system.   [name], M.D.  Primary Care at [facility] [address] [place], [place] [address] [phone] phone [phone] fax  07/13/18 12:02 PM

## 2018-05-27 ENCOUNTER — Telehealth: Payer: Self-pay | Admitting: Family Medicine

## 2018-05-27 ENCOUNTER — Ambulatory Visit: Payer: Self-pay | Admitting: Family Medicine

## 2018-05-27 NOTE — Telephone Encounter (Signed)
Copied from [place] [id number]. Topic: General - Other >> May 27, 2018  2:13 PM Keene Breath wrote: Reason for CRM: Patient called to let the nurse know that he will not be able to come to the office today to leave a stool sample because his arthritis is bothering him and he is have a hard time getting around.  Please call patient to set up another time for him to come. CB# [phone]

## 2018-06-03 ENCOUNTER — Ambulatory Visit: Payer: Self-pay | Admitting: Family Medicine

## 2018-07-21 ENCOUNTER — Other Ambulatory Visit: Payer: Self-pay | Admitting: Family Medicine

## 2018-07-21 NOTE — Telephone Encounter (Signed)
Requested medication (s) are due for refill today: Yes  Requested medication (s) are on the active medication list: no  Last refill:  08/29/16  Future visit scheduled: yes  Notes to clinic:  Med is not on current med list    Requested Prescriptions  Pending Prescriptions Disp Refills   pantoprazole (PROTONIX) 40 MG tablet [Pharmacy Med Name: PANTOPRAZOLE SODIUM 40MG  TER] 90 tablet 1    Sig: TAKE 1 TABLET BY MOUTH ONCE DAILY     Gastroenterology: Proton Pump Inhibitors Passed - 07/21/2018  1:18 PM      Passed - Valid encounter within last 12 months    Recent Outpatient Visits          1 month ago Chronic pain syndrome   [facility] at [facility], Laurey Arrow, MD   5 months ago Type 2 diabetes mellitus with peripheral neuropathy [facility])   [facility] at [facility], Laurey Arrow, MD   9 months ago Type 2 diabetes mellitus with peripheral neuropathy [facility])   [facility] at [facility], Laurey Arrow, MD   1 year ago Type 2 diabetes mellitus with diabetic polyneuropathy, without long-term current use of insulin [facility])   [facility] at [facility], Laurey Arrow, MD   1 year ago Essential hypertension   [facility] at [facility], Laurey Arrow, MD      Future Appointments            In 1 month [name] Laurey Arrow, MD Primary Care at [place]

## 2018-09-13 ENCOUNTER — Other Ambulatory Visit: Payer: Self-pay

## 2018-09-13 ENCOUNTER — Encounter: Payer: Self-pay | Admitting: Family Medicine

## 2018-09-13 ENCOUNTER — Ambulatory Visit: Payer: Self-pay | Admitting: Family Medicine

## 2018-09-13 VITALS — BP 116/73 | HR 76 | Temp 98.0°F | Resp 16 | Wt 189.0 lb

## 2018-09-13 DIAGNOSIS — E1142 Type 2 diabetes mellitus with diabetic polyneuropathy: Secondary | ICD-10-CM

## 2018-09-13 DIAGNOSIS — R319 Hematuria, unspecified: Secondary | ICD-10-CM

## 2018-09-13 DIAGNOSIS — R809 Proteinuria, unspecified: Secondary | ICD-10-CM

## 2018-09-13 DIAGNOSIS — G894 Chronic pain syndrome: Secondary | ICD-10-CM

## 2018-09-13 DIAGNOSIS — R590 Localized enlarged lymph nodes: Secondary | ICD-10-CM

## 2018-09-13 DIAGNOSIS — I10 Essential (primary) hypertension: Secondary | ICD-10-CM

## 2018-09-13 LAB — POCT URINALYSIS DIP (MANUAL ENTRY)
BILIRUBIN UA: NEGATIVE
Glucose, UA: NEGATIVE mg/dL
Ketones, POC UA: NEGATIVE mg/dL
LEUKOCYTES UA: NEGATIVE
NITRITE UA: NEGATIVE
PH UA: 7.5 (ref 5.0–8.0)
Spec Grav, UA: 1.02 (ref 1.010–1.025)
UROBILINOGEN UA: 0.2 U/dL

## 2018-09-13 MED ORDER — LOSARTAN POTASSIUM-HCTZ 100-25 MG PO TABS: 1.0000 | ORAL_TABLET | Freq: Every day | ORAL | 1 refills | Status: DC

## 2018-09-13 MED ORDER — FENTANYL 75 MCG/HR TD PT72: 75.0000 ug | MEDICATED_PATCH | TRANSDERMAL | 0 refills | Status: DC

## 2018-09-13 MED ORDER — OXYCODONE HCL 10 MG PO TABS: 10.0000 mg | ORAL_TABLET | ORAL | 0 refills | Status: DC | PRN

## 2018-09-13 MED ORDER — CARISOPRODOL 350 MG PO TABS: ORAL_TABLET | ORAL | 2 refills | Status: DC

## 2018-09-13 MED ORDER — PANTOPRAZOLE SODIUM 40 MG PO TBEC: 40.0000 mg | DELAYED_RELEASE_TABLET | Freq: Every day | ORAL | 3 refills | Status: DC

## 2018-09-13 MED ORDER — ATENOLOL 100 MG PO TABS: 100.0000 mg | ORAL_TABLET | Freq: Every day | ORAL | 3 refills | Status: DC

## 2018-09-13 NOTE — Patient Instructions (Addendum)
You have blood and protein in your urine so I do recommend that we do blood test to check on your blood counts and your kidney function.  If you decide to come back next month for further evaluation, call ahead so I can put in the tests and we can do it as a lab only visit to avoid the cost of an office visit.  If the lymph node in the middle of the left side of your neck does not go away within a month or if at all it becomes more tender large, or from, please come back in ASAP for chest x-ray, blood counts, and inflammation level.  Please stop smoking ASAP as this could be making both the lymph node and the blood in the urine much worse.  When you come back in for your next visit make sure you are very well hydrated and have been drinking a lot of water and have eaten that day so we can see if the blood has cleared up.   If you have lab work done today you will be contacted with your lab results within the next 2 weeks.  If you have not heard from [place] then please contact us. The fastest way to get your results is to register for My Chart.   IF you received an x-ray today, you will receive an invoice from [facility]. Please contact [facility] at [phone] with questions or concerns regarding your invoice.   IF you received labwork today, you will receive an invoice from [facility]. Please contact [facility] at [phone] with questions or concerns regarding your invoice.   Our billing staff will not be able to assist you with questions regarding bills from these companies.  You will be contacted with the lab results as soon as they are available. The fastest way to get your results is to activate your My Chart account. Instructions are located on the last page of this paperwork. If you have not heard from [place] regarding the results in 2 weeks, please contact this office.    Hematuria, Adult Hematuria is blood in your urine. It can be caused by a bladder infection, kidney  infection, prostate infection, kidney stone, or cancer of your urinary tract. Infections can usually be treated with medicine, and a kidney stone usually will pass through your urine. If neither of these is the cause of your hematuria, further workup to find out the reason may be needed. It is very important that you tell your health care provider about any blood you see in your urine, even if the blood stops without treatment or happens without causing pain. Blood in your urine that happens and then stops and then happens again can be a symptom of a very serious condition. Also, pain is not a symptom in the initial stages of many urinary cancers. Follow these instructions at home:  Drink lots of fluid, 3-4 quarts a day. If you have been diagnosed with an infection, cranberry juice is especially recommended, in addition to large amounts of water.  Avoid caffeine, tea, and carbonated beverages because they tend to irritate the bladder.  Avoid alcohol because it may irritate the prostate.  Take all medicines as directed by your health care provider.  If you were prescribed an antibiotic medicine, finish it all even if you start to feel better.  If you have been diagnosed with a kidney stone, follow your health care provider's instructions regarding straining your urine to catch the stone.  Empty your  bladder often. Avoid holding urine for long periods of time.  After a bowel movement, women should cleanse front to back. Use each tissue only once.  Empty your bladder before and after sexual intercourse if you are a male. Contact a health care provider if:  You develop back pain.  You have a fever.  You have a feeling of sickness in your stomach (nausea) or vomiting.  Your symptoms are not better in 3 days. Return sooner if you are getting worse. Get help right away if:  You develop severe vomiting and are unable to keep the medicine down.  You develop severe back or abdominal pain  despite taking your medicines.  You begin passing a large amount of blood or clots in your urine.  You feel extremely weak or faint, or you pass out. This information is not intended to replace advice given to you by your health care provider. Make sure you discuss any questions you have with your health care provider. Document Released: 10/06/2005 Document Revised: 03/13/2016 Document Reviewed: 06/06/2013 Elsevier Interactive Patient Education  2017 [employer] Inc.  Lymphadenopathy Lymphadenopathy refers to swollen or enlarged lymph glands, also called lymph nodes. Lymph glands are part of your body's defense (immune) system, which protects the body from infections, germs, and diseases. Lymph glands are found in many locations in your body, including the neck, underarm, and groin. Many things can cause lymph glands to become enlarged. When your immune system responds to germs, such as viruses or bacteria, infection-fighting cells and fluid build up. This causes the glands to grow in size. Usually, this is not something to worry about. The swelling and any soreness often go away without treatment. However, swollen lymph glands can also be caused by a number of diseases. Your health care provider may do various tests to help determine the cause. If the cause of your swollen lymph glands cannot be found, it is important to monitor your condition to make sure the swelling goes away. Follow these instructions at home: Watch your condition for any changes. The following actions may help to lessen any discomfort you are feeling:  Get plenty of rest.  Take medicines only as directed by your health care provider. Your health care provider may recommend over-the-counter medicines for pain.  Apply moist heat compresses to the site of swollen lymph nodes as directed by your health care provider. This can help reduce any pain.  Check your lymph nodes daily for any changes.  Keep all follow-up visits as  directed by your health care provider. This is important.  Contact a health care provider if:  Your lymph nodes are still swollen after 2 weeks.  Your swelling increases or spreads to other areas.  Your lymph nodes are hard, seem fixed to the skin, or are growing rapidly.  Your skin over the lymph nodes is red and inflamed.  You have a fever.  You have chills.  You have fatigue.  You develop a sore throat.  You have abdominal pain.  You have weight loss.  You have night sweats. Get help right away if:  You notice fluid leaking from the area of the enlarged lymph node.  You have severe pain in any area of your body.  You have chest pain.  You have shortness of breath. This information is not intended to replace advice given to you by your health care provider. Make sure you discuss any questions you have with your health care provider. Document Released: 07/15/2008 Document Revised: 03/13/2016  Document Reviewed: 05/11/2014 Elsevier Interactive Patient Education  [employer].

## 2018-09-13 NOTE — Progress Notes (Signed)
Subjective:    Patient: Robert Burnett  DOB: Jan 08, 1961; 57 y.o.   MRN: [id number]  Chief Complaint  Patient presents with  . Chronic Pain Syndrome    4 month follow-up   . Diabetes    HPI DM: Not checking cbgs anymore since a1c was so good.  No polyphagia or polydypsia or polyuria. He has noticed a little worsening of his near vision intermittently.  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   HTN:  Checking BP at home regularly and well controlled.  Chronic pain: Much worse currently due to rain.  Just go last month of refill on the soma so will not need until ~12/25.   Several months ago he wanted of acid rflux med but never got called in - has been taking more antacid tablets a few a day but not every day.  Stopped smokinmg 15 years ago though started young as raised on tobacco farm.  Then he made his own pipe and had started smoking it intermittently since then.   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   Medical History Past Medical History:  Diagnosis Date  . Allergy   . Arthritis   . Diabetes mellitus    toe amp 4/12  . Hyperlipidemia   . Hypertension    Past Surgical History:  Procedure Laterality Date  . KNEE SURGERY  1975 and 1989   Current Outpatient Medications on File Prior to Visit  Medication Sig Dispense Refill  . aspirin 81 MG tablet Take 81 mg by mouth daily.    Marland Kitchen atenolol (TENORMIN) 100 MG tablet Take 1 tablet (100 mg total) by mouth daily. 90 tablet 3  . carisoprodol (SOMA) 350 MG tablet TAKE ONE TABLET BY MOUTH FOUR TIMES DAILY AS NEEDED FOR MUSCLE SPASMS. 120 tablet 3  . fentaNYL (DURAGESIC - DOSED MCG/HR) 75 MCG/HR Place 1 patch (75 mcg total) onto the skin every 3 (three) days. 10 patch 0  . fentaNYL (DURAGESIC) 75 MCG/HR Place 1 patch (75 mcg total) onto the skin every 3 (three) days. 10 patch 0  . fentaNYL (DURAGESIC) 75 MCG/HR Place 1 patch (75 mcg total) onto the skin every 3 (three) days. 10 patch 0  . fentaNYL (DURAGESIC) 75 MCG/HR Place 1  patch (75 mcg total) onto the skin every 3 (three) days. 10 patch 0  . fluticasone (FLONASE) 50 MCG/ACT nasal spray Place 2 sprays into the nose daily.    Marland Kitchen losartan-hydrochlorothiazide (HYZAAR) 100-25 MG tablet Take 1 tablet by mouth daily. 90 tablet 1  . Oxycodone HCl 10 MG TABS Take 1 tablet (10 mg total) by mouth every 4 (four) hours as needed (pain). 150 tablet 0  . Oxycodone HCl 10 MG TABS Take 1 tablet (10 mg total) by mouth every 4 (four) hours as needed (pain). 150 tablet 0  . Oxycodone HCl 10 MG TABS Take 1 tablet (10 mg total) by mouth every 4 (four) hours as needed. 150 tablet 0  . Oxycodone HCl 10 MG TABS Take 1 tablet (10 mg total) by mouth every 4 (four) hours as needed (pain). 150 tablet 0   No current facility-administered medications on file prior to visit.    Allergies  Allergen Reactions  . Prozac [Fluoxetine Hcl]   . Amlodipine Rash   Family History  Problem Relation Age of Onset  . Hypertension Father   . Diabetes Father   . Cancer Father   . Diabetes Sister   . Cancer Sister   . Cancer  Mother    Social History   Socioeconomic History  . Marital status: Single    Spouse name: Not on file  . Number of children: Not on file  . Years of education: Not on file  . Highest education level: Not on file  Occupational History  . Not on file  Social Needs  . Financial resource strain: Not on file  . Food insecurity:    Worry: Not on file    Inability: Not on file  . Transportation needs:    Medical: Not on file    Non-medical: Not on file  Tobacco Use  . Smoking status: Former Smoker    Packs/day: 2.00    Years: 5.00    Pack years: 10.00    Types: Cigarettes    Last attempt to quit: 10/20/1998    Years since quitting: 19.9  . Smokeless tobacco: Former Systems developer  . Tobacco comment: used chewing tobacco for about a year at age 58  Substance and Sexual Activity  . Alcohol use: No  . Drug use: No  . Sexual activity: Not on file  Lifestyle  . Physical  activity:    Days per week: Not on file    Minutes per session: Not on file  . Stress: Not on file  Relationships  . Social connections:    Talks on phone: Not on file    Gets together: Not on file    Attends religious service: Not on file    Active member of club or organization: Not on file    Attends meetings of clubs or organizations: Not on file    Relationship status: Not on file  Other Topics Concern  . Not on file  Social History Narrative  . Not on file   Depression screen [facility] 2/9 05/24/2018 02/01/2018 10/10/2017 06/18/2017 02/20/2017  Decreased Interest 0 0 0 0 0  Down, Depressed, Hopeless 0 0 0 0 0  PHQ - 2 Score 0 0 0 0 0  Altered sleeping - - - - -  Tired, decreased energy - - - - -  Change in appetite - - - - -  Feeling bad or failure about yourself  - - - - -  Trouble concentrating - - - - -  Moving slowly or fidgety/restless - - - - -  Suicidal thoughts - - - - -  PHQ-9 Score - - - - -    ROS As noted in HPI  Objective:  BP 116/73   Pulse 76   Temp 98 F (36.7 C) (Oral)   Resp 16   Wt 189 lb (85.7 kg)   SpO2 100%   BMI 26.17 kg/m  Physical Exam  Constitutional: He is oriented to person, place, and time. He appears well-developed and well-nourished. No distress.  HENT:  Head: Normocephalic and atraumatic.  Eyes: Pupils are equal, round, and reactive to light. Conjunctivae are normal. No scleral icterus.  Neck: Normal range of motion. Neck supple. No thyromegaly present.  Cardiovascular: Normal rate, regular rhythm, normal heart sounds and intact distal pulses.  Pulmonary/Chest: Effort normal and breath sounds normal. No respiratory distress.  Musculoskeletal: He exhibits no edema.  Lymphadenopathy:    He has cervical adenopathy.       Left cervical: Superficial cervical (mid neck, approx 2 cm dm, soft, mobile, non tender, poorly defined) adenopathy present.  Neurological: He is alert and oriented to person, place, and time.  Skin: Skin is warm and  dry. He is not diaphoretic.  Psychiatric: He has a normal mood and affect. His behavior is normal.    Diabetic Foot Exam - Simple   Simple Foot Form Visual Inspection See comments:  Yes Sensation Testing Intact to touch and monofilament testing bilaterally:  Yes Pulse Check Posterior Tibialis and Dorsalis pulse intact bilaterally:  Yes Comments Left foot has callus at the heel of the foot and top below the toes. Pt states he has a cyst the burst on the left foot.      [facility] TESTING Office Visit on 09/13/2018  Component Date Value Ref Range Status  . Color, UA 09/13/2018 brown* yellow Final  . Clarity, UA 09/13/2018 cloudy* clear Final  . Glucose, UA 09/13/2018 negative  negative mg/dL Final  . Bilirubin, UA 09/13/2018 negative  negative Final  . Ketones, POC UA 09/13/2018 negative  negative mg/dL Final  . Spec Grav, UA 09/13/2018 1.020  1.010 - 1.025 Final  . Blood, UA 09/13/2018 large* negative Final  . pH, UA 09/13/2018 7.5  5.0 - 8.0 Final  . Protein Ur, POC 09/13/2018 =30* negative mg/dL Final  . Urobilinogen, UA 09/13/2018 0.2  0.2 or 1.0 E.U./dL Final  . Nitrite, UA 09/13/2018 Negative  Negative Final  . Leukocytes, UA 09/13/2018 Negative  Negative Final     Assessment & Plan:   1. Type 2 diabetes mellitus with peripheral neuropathy (HCC) - no glucose in urine today reassuring, pt denies any acute sxs of hyperglcyemia  2. Essential hypertension - very well controlled on current regimen  3. Chronic pain syndrome - refilled chronic meds  4. Anterior cervical adenopathy   5. Hematuria, unspecified type   6. Proteinuria, unspecified type    Recommend cbc, esr, bmp, urine micro, and CXR but pt refuses due to cost - no health ins and cannot afford gas, tickets to keep truck registered, etc. Needs to get get orange card again (doesn't qualify for disability as not worked in so long and has to much property/equity to qualify for medicaid.) Pt agrees that if left focal anterior  cervical adenopathy has not resolved in 2 wks or notices urine is still dark/tea colored to call and will come in for lab/radiology -only visit to get these tests done to further eval the unexpected proteinuria/hematuria as well as the isolated left anterior cervical adenopathy.    Patient will continue on current chronic medications other than changes noted above, so ok to refill when needed.   See after visit summary for patient specific instructions.  Orders Placed This Encounter  Procedures  . POCT urinalysis dipstick  . HM DIABETES FOOT EXAM    Meds ordered this encounter  Medications  . atenolol (TENORMIN) 100 MG tablet    Sig: Take 1 tablet (100 mg total) by mouth daily.    Dispense:  90 tablet    Refill:  3  . carisoprodol (SOMA) 350 MG tablet    Sig: TAKE ONE TABLET BY MOUTH FOUR TIMES DAILY AS NEEDED FOR MUSCLE SPASMS.    Dispense:  120 tablet    Refill:  2    May fill no sooner than every 28 days  . pantoprazole (PROTONIX) 40 MG tablet    Sig: Take 1 tablet (40 mg total) by mouth daily.    Dispense:  30 tablet    Refill:  3  . fentaNYL (DURAGESIC) 75 MCG/HR    Sig: Place 1 patch (75 mcg total) onto the skin every 3 (three) days.    Dispense:  10 patch  Refill:  0    Chronic pain syndrome  . fentaNYL (DURAGESIC) 75 MCG/HR    Sig: Place 1 patch (75 mcg total) onto the skin every 3 (three) days.    Dispense:  10 patch    Refill:  0    Chronic pain syndrome  . fentaNYL (DURAGESIC) 75 MCG/HR    Sig: Place 1 patch (75 mcg total) onto the skin every 3 (three) days.    Dispense:  10 patch    Refill:  0    Chronic pain syndrome  . Oxycodone HCl 10 MG TABS    Sig: Take 1 tablet (10 mg total) by mouth every 4 (four) hours as needed (pain).    Dispense:  150 tablet    Refill:  0    Chronic pain syndrome  . Oxycodone HCl 10 MG TABS    Sig: Take 1 tablet (10 mg total) by mouth every 4 (four) hours as needed (pain).    Dispense:  150 tablet    Refill:  0    Chronic  pain syndrome  . Oxycodone HCl 10 MG TABS    Sig: Take 1 tablet (10 mg total) by mouth every 4 (four) hours as needed (pain).    Dispense:  150 tablet    Refill:  0    Chronic pain syndrome  . Oxycodone HCl 10 MG TABS    Sig: Take 1 tablet (10 mg total) by mouth every 4 (four) hours as needed.    Dispense:  150 tablet    Refill:  0    Chronic pain syndrome  . fentaNYL (DURAGESIC - DOSED MCG/HR) 75 MCG/HR    Sig: Place 1 patch (75 mcg total) onto the skin every 3 (three) days.    Dispense:  10 patch    Refill:  0    Chronic pain syndrome  . losartan-hydrochlorothiazide (HYZAAR) 100-25 MG tablet    Sig: Take 1 tablet by mouth daily.    Dispense:  90 tablet    Refill:  1    Patient verbalized to me that they understand the following: diagnosis, what is being done for them, what to expect and what should be done at home.  Their questions have been answered. They understand that I am unable to predict every possible medication interaction or adverse outcome and that if any unexpected symptoms arise, they should contact us and their pharmacist, as well as never hesitate to seek urgent/emergent care at [facility] or ER if they think it might be warranted.    [name], MD, MPH [facility] [address] [place], [place]  [address] [phone] Office phone  [phone] Office fax  09/13/18 12:24 PM

## 2018-09-19 DIAGNOSIS — R319 Hematuria, unspecified: Secondary | ICD-10-CM | POA: Insufficient documentation

## 2018-09-19 DIAGNOSIS — R809 Proteinuria, unspecified: Secondary | ICD-10-CM | POA: Insufficient documentation

## 2018-09-19 DIAGNOSIS — R590 Localized enlarged lymph nodes: Secondary | ICD-10-CM | POA: Insufficient documentation

## 2018-12-20 ENCOUNTER — Telehealth: Payer: Self-pay | Admitting: Family Medicine

## 2018-12-20 NOTE — Telephone Encounter (Signed)
Tried to call patient to let them know Dr. [name] is no longer at [place] [facility] and that their appointment with her is going to be cancelled.   They do not have a voicemail set up so we could not leave a message. If patient calls back, please try to get them rescheduled with a different provider or let them know that Dr [name] is going to be working at [facility] the number there is [phone].

## 2018-12-26 ENCOUNTER — Other Ambulatory Visit: Payer: Self-pay | Admitting: Family Medicine

## 2018-12-26 DIAGNOSIS — G894 Chronic pain syndrome: Secondary | ICD-10-CM

## 2018-12-26 MED ORDER — FENTANYL 75 MCG/HR TD PT72: 1.0000 | MEDICATED_PATCH | TRANSDERMAL | 0 refills | Status: DC

## 2018-12-26 MED ORDER — OXYCODONE HCL 10 MG PO TABS: 10.0000 mg | ORAL_TABLET | ORAL | 0 refills | Status: DC | PRN

## 2018-12-26 NOTE — Progress Notes (Signed)
Called in refills pt will need until he can establish with me at the new practice.

## 2019-01-03 ENCOUNTER — Ambulatory Visit: Payer: Self-pay | Admitting: Family Medicine

## 2019-01-06 ENCOUNTER — Other Ambulatory Visit: Payer: Self-pay

## 2019-01-06 ENCOUNTER — Ambulatory Visit: Payer: Self-pay | Admitting: Family Medicine

## 2019-01-06 ENCOUNTER — Encounter: Payer: Self-pay | Admitting: Family Medicine

## 2019-01-06 VITALS — BP 116/74 | HR 70 | Temp 98.3°F | Resp 16 | Ht 73.0 in | Wt 192.2 lb

## 2019-01-06 DIAGNOSIS — E1142 Type 2 diabetes mellitus with diabetic polyneuropathy: Secondary | ICD-10-CM

## 2019-01-06 DIAGNOSIS — I10 Essential (primary) hypertension: Secondary | ICD-10-CM

## 2019-01-06 DIAGNOSIS — R12 Heartburn: Secondary | ICD-10-CM

## 2019-01-06 DIAGNOSIS — G894 Chronic pain syndrome: Secondary | ICD-10-CM

## 2019-01-06 DIAGNOSIS — R319 Hematuria, unspecified: Secondary | ICD-10-CM

## 2019-01-06 DIAGNOSIS — R809 Proteinuria, unspecified: Secondary | ICD-10-CM

## 2019-01-06 DIAGNOSIS — R21 Rash and other nonspecific skin eruption: Secondary | ICD-10-CM

## 2019-01-06 LAB — POCT URINALYSIS DIP (MANUAL ENTRY)
Glucose, UA: NEGATIVE mg/dL
Leukocytes, UA: NEGATIVE
Nitrite, UA: NEGATIVE
Protein Ur, POC: 30 mg/dL — AB
Spec Grav, UA: 1.025 (ref 1.010–1.025)
Urobilinogen, UA: 1 E.U./dL
pH, UA: 5.5 (ref 5.0–8.0)

## 2019-01-06 LAB — POC MICROSCOPIC URINALYSIS (UMFC): Mucus: ABSENT

## 2019-01-06 MED ORDER — PANTOPRAZOLE SODIUM 40 MG PO TBEC: 40.0000 mg | DELAYED_RELEASE_TABLET | Freq: Every day | ORAL | 3 refills | Status: DC

## 2019-01-06 MED ORDER — ATENOLOL 100 MG PO TABS: 50.0000 mg | ORAL_TABLET | Freq: Every day | ORAL | 1 refills | Status: DC

## 2019-01-06 MED ORDER — LOSARTAN POTASSIUM-HCTZ 100-25 MG PO TABS: 0.5000 | ORAL_TABLET | Freq: Every day | ORAL | 1 refills | Status: DC
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
## 2019-01-07 LAB — BASIC METABOLIC PANEL
BUN/Creatinine Ratio: 15 (ref 9–20)
BUN: 27 mg/dL — ABNORMAL HIGH (ref 6–24)
CO2: 18 mmol/L — ABNORMAL LOW (ref 20–29)
Calcium: 9.6 mg/dL (ref 8.7–10.2)
Chloride: 96 mmol/L (ref 96–106)
Creatinine, Ser: 1.76 mg/dL — ABNORMAL HIGH (ref 0.76–1.27)
GFR calc Af Amer: 48 mL/min/{1.73_m2} — ABNORMAL LOW (ref >59)
GFR calc non Af Amer: 42 mL/min/{1.73_m2} — ABNORMAL LOW (ref >59)
Glucose: 95 mg/dL (ref 65–99)
POTASSIUM: 4.1 mmol/L (ref 3.5–5.2)
Sodium: 136 mmol/L (ref 134–144)

## 2019-01-09 ENCOUNTER — Other Ambulatory Visit: Payer: Self-pay | Admitting: Family Medicine

## 2019-01-09 DIAGNOSIS — R319 Hematuria, unspecified: Secondary | ICD-10-CM

## 2019-01-09 DIAGNOSIS — R7989 Other specified abnormal findings of blood chemistry: Secondary | ICD-10-CM

## 2019-01-09 NOTE — Progress Notes (Signed)
See lab notes

## 2019-03-30 ENCOUNTER — Telehealth: Payer: Self-pay | Admitting: Family Medicine

## 2019-03-30 NOTE — Telephone Encounter (Signed)
Copied from [place] [id number]. Topic: Quick Communication - Rx Refill/Question >> Mar 30, 2019  5:21 PM Nils Flack wrote: Medication: fentaNYL (DURAGESIC) 75 MCG/HR and Oxycodone HCl 10 MG TABS Has the patient contacted their pharmacy? Yes.   (Agent: If no, request that the patient contact the pharmacy for the refill.) (Agent: If yes, when and what did the pharmacy advise?)  Preferred Pharmacy (with phone number or street name): [facility]  Pharm said that they did not receive these rx's   Agent: Please be advised that RX refills may take up to 3 business days. We ask that you follow-up with your pharmacy.

## 2019-04-04 ENCOUNTER — Telehealth: Payer: Self-pay | Admitting: Family Medicine

## 2019-04-04 NOTE — Telephone Encounter (Signed)
Pt needs med refills and ROI to Dr. Raul Del office.

## 2019-04-04 NOTE — Telephone Encounter (Signed)
Pt is needing an appt

## 2019-04-05 NOTE — Telephone Encounter (Signed)
TRANSFERRING TO DR [name]

## 2019-04-06 NOTE — Telephone Encounter (Signed)
More info please - what refills and please assist in ROI request. Thanks.

## 2019-04-07 NOTE — Telephone Encounter (Signed)
After speaking with pt he informed me he has an appointment with [name] and no longer needs RX

## 2019-04-07 NOTE — Telephone Encounter (Signed)
He needs refill on pain medication at this time but I will call pt and advise him to make an appointment with Dr. [name] office and reccommend  pain management. Is this something you would like me to do?

## 2020-09-19 ENCOUNTER — Other Ambulatory Visit (HOSPITAL_COMMUNITY): Payer: Self-pay | Admitting: Urology

## 2020-09-19 DIAGNOSIS — R310 Gross hematuria: Secondary | ICD-10-CM

## 2020-10-02 ENCOUNTER — Ambulatory Visit (HOSPITAL_COMMUNITY)
Admission: RE | Admit: 2020-10-02 | Discharge: 2020-10-02 | Disposition: A | Discharge status: Home / Self Care | Payer: Self-pay | Source: Ambulatory Visit | Attending: Urology | Admitting: Urology

## 2020-10-02 ENCOUNTER — Other Ambulatory Visit: Payer: Self-pay

## 2020-10-02 DIAGNOSIS — R310 Gross hematuria: Secondary | ICD-10-CM | POA: Insufficient documentation

## 2020-10-02 MED ORDER — IOHEXOL 300 MG/ML  SOLN
100.0000 mL | Freq: Once | INTRAMUSCULAR | Status: AC | PRN
  Administered 2020-10-02: 100 mL via INTRAVENOUS

## 2020-10-02 MED ORDER — SODIUM CHLORIDE (PF) 0.9 % IJ SOLN
INTRAMUSCULAR | Status: AC
  Filled 2020-10-02: qty 50

## 2020-10-02 MED ORDER — SODIUM CHLORIDE 0.9 % IV SOLN
INTRAVENOUS | Status: AC
  Filled 2020-10-02: qty 250

## 2020-10-09 ENCOUNTER — Other Ambulatory Visit: Payer: Self-pay | Admitting: Urology

## 2020-10-24 NOTE — Progress Notes (Signed)
DUE TO COVID-19 ONLY ONE VISITOR IS ALLOWED TO COME WITH YOU AND STAY IN THE WAITING ROOM ONLY DURING PRE OP AND PROCEDURE DAY OF SURGERY. THE 1 VISITOR  MAY VISIT WITH YOU AFTER SURGERY IN YOUR PRIVATE ROOM DURING VISITING HOURS ONLY!  YOU NEED TO HAVE A COVID 19 TEST ON__1/03/2021 _____ @__1040  am _____, THIS TEST MUST BE DONE BEFORE SURGERY,  COVID TESTING SITE [address] [place] , IT IS ON THE RIGHT GOING OUT [address] APPROXIMATELY  2 MINUTES PAST [employer] ON THE RIGHT. ONCE YOUR COVID TEST IS COMPLETED,  PLEASE BEGIN THE QUARANTINE INSTRUCTIONS AS OUTLINED IN YOUR HANDOUT.                Robert Burnett  10/24/2020   Your procedure is scheduled on: 10/29/2020    Report to [facility] Main  Entrance   Report to admitting a t130pm          Call this number if you have problems the morning of surgery [phone]    Remember: Do not eat food , candy gum or mints :After Midnight. You may have clear liquids from midnight until 1230pm    CLEAR LIQUID DIET   Foods Allowed                                                                       Coffee and tea, regular and decaf                              Plain Jell-O any favor except red or purple                                            Fruit ices (not with fruit pulp)                                      Iced Popsicles                                     Carbonated beverages, regular and diet                                    Cranberry, grape and apple juices Sports drinks like Gatorade Lightly seasoned clear broth or consume(fat free) Sugar, honey syrup   _____________________________________________________________________    BRUSH YOUR TEETH MORNING OF SURGERY AND RINSE YOUR MOUTH OUT, NO CHEWING GUM CANDY OR MINTS.     Take these medicines the morning of surgery with A SIP OF WATER: atenolol, protonix   DO NOT TAKE ANY DIABETIC MEDICATIONS DAY OF YOUR SURGERY                                You may not have any  metal on your body including hair pins and              piercings  Do not wear jewelry, make-up, lotions, powders or perfumes, deodorant             Do not wear nail polish on your fingernails.  Do not shave  48 hours prior to surgery.              Men may shave face and neck.   Do not bring valuables to the hospital. [place].  Contacts, dentures or bridgework may not be worn into surgery.  Leave suitcase in the car. After surgery it may be brought to your room.     Patients discharged the day of surgery will not be allowed to drive home. IF YOU ARE HAVING SURGERY AND GOING HOME THE SAME DAY, YOU MUST HAVE AN ADULT TO DRIVE YOU HOME AND BE WITH YOU FOR 24 HOURS. YOU MAY GO HOME BY TAXI OR UBER OR ORTHERWISE, BUT AN ADULT MUST ACCOMPANY YOU HOME AND STAY WITH YOU FOR 24 HOURS.  Name and phone number of your driver:  Special Instructions: N/A              Please read over the following fact sheets you were given: _____________________________________________________________________  [facility] - Preparing for Surgery Before surgery, you can play an important role.  Because skin is not sterile, your skin needs to be as free of germs as possible.  You can reduce the number of germs on your skin by washing with CHG (chlorahexidine gluconate) soap before surgery.  CHG is an antiseptic cleaner which kills germs and bonds with the skin to continue killing germs even after washing. Please DO NOT use if you have an allergy to CHG or antibacterial soaps.  If your skin becomes reddened/irritated stop using the CHG and inform your nurse when you arrive at Short Stay. Do not shave (including legs and underarms) for at least 48 hours prior to the first CHG shower.  You may shave your face/neck. Please follow these instructions carefully:  1.  Shower with CHG Soap the night before surgery and the  morning of  Surgery.  2.  If you choose to wash your hair, wash your hair first as usual with your  normal  shampoo.  3.  After you shampoo, rinse your hair and body thoroughly to remove the  shampoo.                           4.  Use CHG as you would any other liquid soap.  You can apply chg directly  to the skin and wash                       Gently with a scrungie or clean washcloth.  5.  Apply the CHG Soap to your body ONLY FROM THE NECK DOWN.   Do not use on face/ open                           Wound or open sores. Avoid contact with eyes, ears mouth and genitals (private parts).                       Wash face,  Genitals (private parts) with your normal soap.             6.  Wash thoroughly, paying special attention to the area where your surgery  will be performed.  7.  Thoroughly rinse your body with warm water from the neck down.  8.  DO NOT shower/wash with your normal soap after using and rinsing off  the CHG Soap.                9.  Pat yourself dry with a clean towel.            10.  Wear clean pajamas.            11.  Place clean sheets on your bed the night of your first shower and do not  sleep with pets. Day of Surgery : Do not apply any lotions/deodorants the morning of surgery.  Please wear clean clothes to the hospital/surgery center.  FAILURE TO FOLLOW THESE INSTRUCTIONS MAY RESULT IN THE CANCELLATION OF YOUR SURGERY PATIENT SIGNATURE_________________________________  NURSE SIGNATURE__________________________________  ________________________________________________________________________

## 2020-10-25 ENCOUNTER — Other Ambulatory Visit (HOSPITAL_COMMUNITY)
Admission: RE | Admit: 2020-10-25 | Discharge: 2020-10-25 | Disposition: A | Discharge status: Home / Self Care | Payer: Self-pay | Source: Ambulatory Visit | Attending: Urology | Admitting: Urology

## 2020-10-25 ENCOUNTER — Other Ambulatory Visit: Payer: Self-pay

## 2020-10-25 ENCOUNTER — Encounter (HOSPITAL_COMMUNITY)
Admission: RE | Admit: 2020-10-25 | Discharge: 2020-10-25 | Disposition: A | Discharge status: Home / Self Care | Payer: Self-pay | Source: Ambulatory Visit | Attending: Urology | Admitting: Urology

## 2020-10-25 ENCOUNTER — Encounter (HOSPITAL_COMMUNITY): Payer: Self-pay

## 2020-10-25 DIAGNOSIS — Z01818 Encounter for other preprocedural examination: Secondary | ICD-10-CM | POA: Insufficient documentation

## 2020-10-25 DIAGNOSIS — Z20822 Contact with and (suspected) exposure to covid-19: Secondary | ICD-10-CM | POA: Insufficient documentation

## 2020-10-25 HISTORY — DX: Pneumonia, unspecified organism: J18.9

## 2020-10-25 HISTORY — DX: Unspecified asthma, uncomplicated: J45.909

## 2020-10-25 HISTORY — DX: Gastro-esophageal reflux disease without esophagitis: K21.9

## 2020-10-25 LAB — CBC
HCT: 34.8 % — ABNORMAL LOW (ref 39.0–52.0)
Hemoglobin: 11.3 g/dL — ABNORMAL LOW (ref 13.0–17.0)
MCH: 28.5 pg (ref 26.0–34.0)
MCHC: 32.5 g/dL (ref 30.0–36.0)
MCV: 87.7 fL (ref 80.0–100.0)
Platelets: 192 10*3/uL (ref 150–400)
RBC: 3.97 MIL/uL — ABNORMAL LOW (ref 4.22–5.81)
RDW: 14.1 % (ref 11.5–15.5)
WBC: 7.9 10*3/uL (ref 4.0–10.5)
nRBC: 0 % (ref 0.0–0.2)

## 2020-10-25 LAB — BASIC METABOLIC PANEL
Anion gap: 13 (ref 5–15)
BUN: 10 mg/dL (ref 6–20)
CO2: 24 mmol/L (ref 22–32)
Calcium: 9.2 mg/dL (ref 8.9–10.3)
Chloride: 100 mmol/L (ref 98–111)
Creatinine, Ser: 1.24 mg/dL (ref 0.61–1.24)
GFR, Estimated: >60 mL/min (ref >60)
Glucose, Bld: 171 mg/dL — ABNORMAL HIGH (ref 70–99)
Potassium: 4.6 mmol/L (ref 3.5–5.1)
Sodium: 137 mmol/L (ref 135–145)

## 2020-10-25 LAB — GLUCOSE, CAPILLARY: Glucose-Capillary: 168 mg/dL — ABNORMAL HIGH (ref 70–99)

## 2020-10-25 LAB — HEMOGLOBIN A1C
Hgb A1c MFr Bld: 6.8 % — ABNORMAL HIGH (ref 4.8–5.6)
Mean Plasma Glucose: 148.46 mg/dL

## 2020-10-25 LAB — SARS CORONAVIRUS 2 (TAT 6-24 HRS): SARS Coronavirus 2: NEGATIVE

## 2020-10-26 NOTE — H&P (Signed)
Gross hematuria   Robert Burnett is a 60 year old gentleman who had presented this summer to the resident clinic and was seen by Dr. Everlena Cooper for recurrent gross hematuria. This had been occurring over the past year intermittently. He has a distant history of 2 decades of cigarette smoking but had quit. He does smoke a pipe daily for the past few years. He is not on any anticoagulants except for aspirin. Baseline serum creatinine was noted to be 1.56 the past. He did have a PSA in 2020 that was 0.44. He was recommended to undergo a hematuria evaluation including CT imaging and cystoscopy. He delayed this due to concerns about his finances. He returns today and states that he has continued to have intermittent gross hematuria and developed temporary urinary clot retention this morning. He was subsequently able to void and feels that he did empty his bladder. He returns today for further evaluation. He denies any flank or abdominal pain.     ALLERGIES: Blood Pressure Med (Can't Remember The Name Of It) Prozac - Skin Rash    MEDICATIONS: Aspirin 325 mg tablet  Atenolol 100 mg tablet  Fentanyl  Losartan-Hydrochlorothiazide 50 mg-12.5 mg tablet  Oxycodone Hcl 10 mg tablet  Protonix 40 mg tablet, delayed release     GU PSH: No GU PSH    NON-GU PSH: Foot/toes Surgery Procedure, Left, 2nd toe removed     GU PMH: Gross hematuria - 04/12/2020 Kidney Failure Unspec    NON-GU PMH: Arthritis Depression GERD Hypercholesterolemia Hypertension    FAMILY HISTORY: Bone Cancer - Grandmother Breast Cancer - Mother Death In The Family Father - Father Death In The Family Mother - Mother Lung Cancer - Father pancreatic cancer - Sister   SOCIAL HISTORY: Marital Status: Single Preferred Language: English; Ethnicity: Not Hispanic Or Latino; Race: White Current Smoking Status: Patient does not smoke anymore. Has not smoked since 03/20/2018. Smoked for 8 years. Smoked 1 pack per day.   Tobacco Use  Assessment Completed: Used Tobacco in last 30 days? Drinks 3 caffeinated drinks per day. Patient's occupation is/was disabled.     Notes: Smokes a pipe occasionally   REVIEW OF SYSTEMS:    GU Review Male:   Patient denies frequent urination, hard to postpone urination, burning/ pain with urination, get up at night to urinate, leakage of urine, stream starts and stops, trouble starting your streams, and have to strain to urinate .  Gastrointestinal (Lower):   Patient denies diarrhea and constipation.  Gastrointestinal (Upper):   Patient denies nausea and vomiting.  Constitutional:   Patient denies fever, night sweats, weight loss, and fatigue.  Skin:   Patient denies skin rash/ lesion and itching.  Eyes:   Patient denies blurred vision and double vision.  Ears/ Nose/ Throat:   Patient denies sore throat and sinus problems.  Hematologic/Lymphatic:   Patient denies swollen glands and easy bruising.  Cardiovascular:   Patient denies leg swelling and chest pains.  Respiratory:   Patient denies cough and shortness of breath.  Endocrine:   Patient denies excessive thirst.  Musculoskeletal:   Patient denies back pain and joint pain.  Neurological:   Patient denies headaches and dizziness.  Psychologic:   Patient denies depression and anxiety.   VITAL SIGNS:     Weight 195 lb / 88.45 kg  Height 73 in / 185.42 cm  BMI 25.7 kg/m   MULTI-SYSTEM PHYSICAL EXAMINATION:    Constitutional: Well-nourished. No physical deformities. Normally developed. Good grooming.  Respiratory: No labored breathing,  no use of accessory muscles.   Cardiovascular: Normal temperature, normal extremity pulses, no swelling, no varicosities.  Gastrointestinal: No mass, no tenderness, no rigidity, non obese abdomen. No CVA tenderness.        ASSESSMENT:      ICD-10 Details  1 GU:   Gross hematuria - R31.0   2   BPH w/o LUTS - N40.0    PLAN:         1. Gross hematuria: He is going to be treated with antibiotic  therapy over the next few days pending a urine culture. Most likely, his hematuria is related to BPH and he will be started on finasteride 5 mg daily. I have recommended that he proceed with the remainder of his hematuria evaluation including a hematuria protocol CT scan. He will be called with these results. Regardless, I have recommended that he follow up in 6 months even if his upper tract imaging is normal to assess his progress and for any recurrence of bleeding. If he does have any concerning findings on his CT scan for his cytology, we will discuss proceeding with additional evaluation.    I reviewed his CT scan and discussed these findings. 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 I have recommended that he proceed with cystoscopy, left retrograde pyelogram, left ureteroscopy with biopsy and possible left ureteral stent. We reviewed this procedure in detail including the potential risks and the expected recovery process. He is unable to have this performed next week due to a lack of transportation but we will plan to schedule this hopefully in early January.     * Signed by Raynelle Bring, M.D. on 10/05/20 at 5:02 PM (EST)*

## 2020-10-29 ENCOUNTER — Ambulatory Visit (HOSPITAL_COMMUNITY): Payer: Self-pay | Admitting: Certified Registered Nurse Anesthetist

## 2020-10-29 ENCOUNTER — Encounter (HOSPITAL_COMMUNITY): Payer: Self-pay | Admitting: Urology

## 2020-10-29 ENCOUNTER — Encounter (HOSPITAL_COMMUNITY)
Admission: RE | Disposition: A | Discharge status: Home / Self Care | Payer: Self-pay | Source: Home / Self Care | Attending: Urology

## 2020-10-29 ENCOUNTER — Ambulatory Visit (HOSPITAL_COMMUNITY)
Admission: RE | Admit: 2020-10-29 | Discharge: 2020-10-29 | Disposition: A | Discharge status: Home / Self Care | Payer: Self-pay | Attending: Urology | Admitting: Urology

## 2020-10-29 ENCOUNTER — Ambulatory Visit (HOSPITAL_COMMUNITY): Payer: Self-pay

## 2020-10-29 DIAGNOSIS — Z7982 Long term (current) use of aspirin: Secondary | ICD-10-CM | POA: Insufficient documentation

## 2020-10-29 DIAGNOSIS — N400 Enlarged prostate without lower urinary tract symptoms: Secondary | ICD-10-CM | POA: Insufficient documentation

## 2020-10-29 DIAGNOSIS — Z79899 Other long term (current) drug therapy: Secondary | ICD-10-CM | POA: Insufficient documentation

## 2020-10-29 DIAGNOSIS — Z803 Family history of malignant neoplasm of breast: Secondary | ICD-10-CM | POA: Insufficient documentation

## 2020-10-29 DIAGNOSIS — Z800 Family history of malignant neoplasm of digestive organs: Secondary | ICD-10-CM | POA: Insufficient documentation

## 2020-10-29 DIAGNOSIS — C662 Malignant neoplasm of left ureter: Secondary | ICD-10-CM | POA: Insufficient documentation

## 2020-10-29 DIAGNOSIS — Z808 Family history of malignant neoplasm of other organs or systems: Secondary | ICD-10-CM | POA: Insufficient documentation

## 2020-10-29 DIAGNOSIS — F17290 Nicotine dependence, other tobacco product, uncomplicated: Secondary | ICD-10-CM | POA: Insufficient documentation

## 2020-10-29 DIAGNOSIS — Z801 Family history of malignant neoplasm of trachea, bronchus and lung: Secondary | ICD-10-CM | POA: Insufficient documentation

## 2020-10-29 DIAGNOSIS — Z888 Allergy status to other drugs, medicaments and biological substances status: Secondary | ICD-10-CM | POA: Insufficient documentation

## 2020-10-29 DIAGNOSIS — R319 Hematuria, unspecified: Secondary | ICD-10-CM | POA: Insufficient documentation

## 2020-10-29 HISTORY — PX: CYSTOSCOPY/RETROGRADE/URETEROSCOPY: SHX5316

## 2020-10-29 SURGERY — CYSTOSCOPY/RETROGRADE/URETEROSCOPY
Anesthesia: General | Laterality: Left

## 2020-10-29 MED ORDER — LIDOCAINE 2% (20 MG/ML) 5 ML SYRINGE
INTRAMUSCULAR | Status: DC | PRN
  Administered 2020-10-29: 100 mg via INTRAVENOUS

## 2020-10-29 MED ORDER — ACETAMINOPHEN 500 MG PO TABS
1000.0000 mg | ORAL_TABLET | Freq: Once | ORAL | Status: AC
  Administered 2020-10-29: 1000 mg via ORAL
  Filled 2020-10-29: qty 2

## 2020-10-29 MED ORDER — CEFAZOLIN SODIUM-DEXTROSE 2-4 GM/100ML-% IV SOLN
2.0000 g | Freq: Once | INTRAVENOUS | Status: AC
  Administered 2020-10-29: 2 g via INTRAVENOUS
  Filled 2020-10-29: qty 100

## 2020-10-29 MED ORDER — SODIUM CHLORIDE 0.9 % IR SOLN
Status: DC | PRN
  Administered 2020-10-29: 1000 mL

## 2020-10-29 MED ORDER — LACTATED RINGERS IV SOLN: INTRAVENOUS | Status: DC

## 2020-10-29 MED ORDER — CHLORHEXIDINE GLUCONATE 0.12 % MT SOLN
15.0000 mL | Freq: Once | OROMUCOSAL | Status: AC
  Administered 2020-10-29: 15 mL via OROMUCOSAL

## 2020-10-29 MED ORDER — FENTANYL CITRATE (PF) 100 MCG/2ML IJ SOLN
INTRAMUSCULAR | Status: AC
  Administered 2020-10-29: 50 ug via INTRAVENOUS
  Filled 2020-10-29: qty 2

## 2020-10-29 MED ORDER — IOHEXOL 300 MG/ML  SOLN
INTRAMUSCULAR | Status: DC | PRN
  Administered 2020-10-29: 30 mL

## 2020-10-29 MED ORDER — SODIUM CHLORIDE 0.9 % IR SOLN
Status: DC | PRN
  Administered 2020-10-29: 3000 mL

## 2020-10-29 MED ORDER — FENTANYL CITRATE (PF) 100 MCG/2ML IJ SOLN
INTRAMUSCULAR | Status: AC
  Filled 2020-10-29: qty 2

## 2020-10-29 MED ORDER — FENTANYL CITRATE (PF) 100 MCG/2ML IJ SOLN
INTRAMUSCULAR | Status: DC | PRN
  Administered 2020-10-29 (×4): 25 ug via INTRAVENOUS

## 2020-10-29 MED ORDER — DEXAMETHASONE SODIUM PHOSPHATE 10 MG/ML IJ SOLN
INTRAMUSCULAR | Status: DC | PRN
  Administered 2020-10-29: 4 mg via INTRAVENOUS

## 2020-10-29 MED ORDER — FENTANYL CITRATE (PF) 100 MCG/2ML IJ SOLN
25.0000 ug | INTRAMUSCULAR | Status: DC | PRN
  Administered 2020-10-29: 50 ug via INTRAVENOUS

## 2020-10-29 MED ORDER — ONDANSETRON HCL 4 MG/2ML IJ SOLN
INTRAMUSCULAR | Status: DC | PRN
  Administered 2020-10-29: 4 mg via INTRAVENOUS

## 2020-10-29 MED ORDER — ORAL CARE MOUTH RINSE: 15.0000 mL | Freq: Once | OROMUCOSAL | Status: AC

## 2020-10-29 MED ORDER — PROPOFOL 10 MG/ML IV BOLUS
INTRAVENOUS | Status: DC | PRN
  Administered 2020-10-29: 200 mg via INTRAVENOUS

## 2020-10-29 SURGICAL SUPPLY — 23 items
BAG URO CATCHER STRL LF (MISCELLANEOUS) ×2 IMPLANT
BASKET STNLS GEMINI 4WIRE 3FR (BASKET) ×2 IMPLANT
BASKET ZERO TIP NITINOL 2.4FR (BASKET) IMPLANT
BRUSH URET BIOPSY 3F (UROLOGICAL SUPPLIES) ×2 IMPLANT
CATH INTERMIT  6FR 70CM (CATHETERS) ×2 IMPLANT
CLOTH BEACON ORANGE TIMEOUT ST (SAFETY) ×2 IMPLANT
GLOVE SURG ENC TEXT LTX SZ7.5 (GLOVE) ×2 IMPLANT
GOWN STRL REUS W/TWL LRG LVL3 (GOWN DISPOSABLE) ×2 IMPLANT
GUIDEWIRE STR DUAL SENSOR (WIRE) ×4 IMPLANT
GUIDEWIRE ZIPWRE .038 STRAIGHT (WIRE) ×2 IMPLANT
IV NS 1000ML (IV SOLUTION) ×2
IV NS 1000ML BAXH (IV SOLUTION) ×1 IMPLANT
KIT TURNOVER KIT A (KITS) IMPLANT
LASER FIB FLEXIVA PULSE ID 365 (Laser) IMPLANT
MANIFOLD NEPTUNE II (INSTRUMENTS) ×2 IMPLANT
PACK CYSTO (CUSTOM PROCEDURE TRAY) ×2 IMPLANT
SHEATH URETERAL 12FRX35CM (MISCELLANEOUS) IMPLANT
STENT URET 6FRX24 CONTOUR (STENTS) IMPLANT
STENT URET 6FRX26 CONTOUR (STENTS) ×2 IMPLANT
TRACTIP FLEXIVA PULS ID 200XHI (Laser) IMPLANT
TRACTIP FLEXIVA PULSE ID 200 (Laser)
TUBING CONNECTING 10 (TUBING) ×2 IMPLANT
TUBING UROLOGY SET (TUBING) ×2 IMPLANT

## 2020-10-29 NOTE — Op Note (Signed)
Preoperative diagnosis: 1.  Hematuria 2.  Left ureteral tumor  Postoperative diagnosis: 1.  Hematuria 2.  Left ureteral tumor  Procedures: 1.  Cystoscopy 2.  Left retrograde pyelography with interpretation 3.  Left ureteroscopy 4.  Brush biopsy of left ureteral tumor 5.  Biopsy of left ureteral tumor 6.  Left ureteral stent (6 x 26-no string)  Surgeon: [name] MD  Anesthesia: General  Complications: None  EBL: Minimal  Specimens: 1.  Brush biopsy of left ureteral tumor 2.  Biopsy of left ureteral tumor  Disposition of specimens: Pathology  Intraoperative findings: Left retrograde pyelography was performed with Omnipaque contrast and a 6 French ureteral catheter.  Initially, a large filling defect was seen in the mid and upper distal ureter.  No contrast was seen filling above this level.  After a 0.38 Glidewire was able to be passed up beyond the point of obstruction, a 6 [place] ureteral catheter was advanced up past the obstruction and again Omnipaque contrast was injected.  This demonstrated a large hydronephrotic kidney and renal collecting system without evidence of filling defects within the renal collecting system or upper ureter.  Indication: This is a 60 year old gentleman who presented with painless, gross hematuria.  He underwent an evaluation including cystoscopy and hematuria protocol CT imaging.  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  He presents today for further diagnostic evaluation with the above procedures.  The potential risks, complications, and the expected recovery process was discussed in detail.  Informed consent was obtained.  Description of procedure: The patient was taken to the operating room and general anesthetic was administered.  He was given preoperative antibiotics, placed in the dorsolithotomy position, and prepped and draped in the usual sterile fashion.  Next, a preoperative  timeout was performed.  Cystourethroscopy was then performed using a 22 French cystoscope.  This revealed a normal anterior and posterior urethra.  Inspection of the bladder revealed blood near the left ureteral orifice.  No bladder tumors, stones, or other mucosal pathology was identified on cystoscopy.  Attention then turned to the left ureteral orifice.  A 6 French ureteral catheter was used to intubate the distal left ureter and Omnipaque contrast was injected to perform a retrograde pyelogram.  Findings are as dictated above.  A large filling defect was noted in the mid and upper distal left ureter.  An attempt to place a 0.38 sensor guidewire was unsuccessful.  A 0.38 Glidewire was then passed through the 6 [place] ureteral catheter and was able to be advanced up into the kidney.  Proper position was confirmed after the ureteral catheter was advanced and contrast was injected.  The Glidewire was then exchanged for the sensor guidewire.  A 6 French semirigid ureteroscope was then used to perform left ureteroscopy.  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  A brush biopsy was obtained with this area.  In addition, biopsies of the left ureteral tumor were then obtained using a Gemini stone basket.  A 6 x 26 double-J ureteral stent was then advanced over the wire and the wire was removed.  A good curl was noted in the renal pelvis as well as within the bladder.  The patient tolerated the procedure well without complications.  He was able to be awakened and transferred to the recovery unit in satisfactory condition.

## 2020-10-29 NOTE — Discharge Instructions (Signed)

## 2020-10-29 NOTE — Interval H&P Note (Signed)
History and Physical Interval Note:  10/29/2020 2:29 PM  Robert Burnett  has presented today for surgery, with the diagnosis of LEFT URETERAL NEOPLASM.  The various methods of treatment have been discussed with the patient and family. After consideration of risks, benefits and other options for treatment, the patient has consented to  Procedure(s): CYSTOSCOPY/RETROGRADE/ LEFT URETEROSCOPY WITH BIOPSY, POSSIBLE LEFT URETERAL STENT (Left) as a surgical intervention.  The patient's history has been reviewed, patient examined, no change in status, stable for surgery.  I have reviewed the patient's chart and labs.  Questions were answered to the patient's satisfaction.     [employer]

## 2020-10-29 NOTE — Anesthesia Procedure Notes (Signed)
Procedure Name: LMA Insertion Performed by: Virgilio Broadhead H, CRNA Pre-anesthesia Checklist: Patient identified, Emergency Drugs available, Suction available and Patient being monitored Patient Re-evaluated:Patient Re-evaluated prior to induction Oxygen Delivery Method: Circle System Utilized Preoxygenation: Pre-oxygenation with 100% oxygen Induction Type: IV induction Ventilation: Mask ventilation without difficulty LMA: LMA with gastric port inserted LMA Size: 5.0 Number of attempts: 1 Airway Equipment and Method: Bite block Placement Confirmation: positive ETCO2 Tube secured with: Tape Dental Injury: Teeth and Oropharynx as per pre-operative assessment        

## 2020-10-29 NOTE — Transfer of Care (Signed)
Immediate Anesthesia Transfer of Care Note  Patient: Robert Burnett  Procedure(s) Performed: Procedure(s): CYSTOSCOPY/RETROGRADE/ LEFT URETEROSCOPY WITH BIOPSY, LEFT URETERAL STENT (Left)  Patient Location: PACU  Anesthesia Type:General  Level of Consciousness: Alert, Awake, Oriented  Airway & Oxygen Therapy: Patient Spontanous Breathing  Post-op Assessment: Report given to RN  Post vital signs: Reviewed and stable  Last Vitals:  Vitals:   10/29/20 1630 10/29/20 1645  BP: 131/75 133/79  Pulse: 67 63  Resp: 14 (!) 22  Temp:    SpO2: 58% 52%    Complications: No apparent anesthesia complications

## 2020-10-29 NOTE — Anesthesia Postprocedure Evaluation (Signed)
Anesthesia Post Note  Patient: Robert Burnett  Procedure(s) Performed: CYSTOSCOPY/RETROGRADE/ LEFT URETEROSCOPY WITH BIOPSY, LEFT URETERAL STENT (Left )     Patient location during evaluation: PACU Anesthesia Type: General Level of consciousness: awake and alert Pain management: pain level controlled Vital Signs Assessment: post-procedure vital signs reviewed and stable Respiratory status: spontaneous breathing, nonlabored ventilation and respiratory function stable Cardiovascular status: blood pressure returned to baseline and stable Postop Assessment: no apparent nausea or vomiting Anesthetic complications: no   No complications documented.  Last Vitals:  Vitals:   10/29/20 1630 10/29/20 1645  BP: 131/75 133/79  Pulse: 67 63  Resp: 14 (!) 22  Temp:    SpO2: 99% 98%    Last Pain:  Vitals:   10/29/20 1645  TempSrc:   PainSc: 6                  Simren Popson,W. EDMOND

## 2020-10-29 NOTE — Anesthesia Preprocedure Evaluation (Addendum)
Anesthesia Evaluation  Patient identified by MRN, date of birth, ID band Patient awake    Reviewed: Allergy & Precautions, H&P , NPO status , Patient's Chart, lab work & pertinent test results  Airway Mallampati: II  TM Distance: >3 FB Neck ROM: Full    Dental no notable dental hx. (+) Poor Dentition, Dental Advisory Given   Pulmonary asthma , Current Smoker and Patient abstained from smoking.,    Pulmonary exam normal breath sounds clear to auscultation       Cardiovascular hypertension, Pt. on medications and Pt. on home beta blockers  Rhythm:Regular Rate:Normal     Neuro/Psych negative neurological ROS  negative psych ROS   GI/Hepatic Neg liver ROS, GERD  Medicated,  Endo/Other  diabetes  Renal/GU negative Renal ROS  negative genitourinary   Musculoskeletal  (+) Arthritis , Osteoarthritis,    Abdominal   Peds  Hematology negative hematology ROS (+)   Anesthesia Other Findings   Reproductive/Obstetrics negative OB ROS                            Anesthesia Physical Anesthesia Plan  ASA: II  Anesthesia Plan: General   Post-op Pain Management:    Induction: Intravenous  PONV Risk Score and Plan: 2 and Ondansetron, Midazolam and Dexamethasone  Airway Management Planned: LMA  Additional Equipment:   Intra-op Plan:   Post-operative Plan: Extubation in OR  Informed Consent: I have reviewed the patients History and Physical, chart, labs and discussed the procedure including the risks, benefits and alternatives for the proposed anesthesia with the patient or authorized representative who has indicated his/her understanding and acceptance.     Dental advisory given  Plan Discussed with: CRNA  Anesthesia Plan Comments:         Anesthesia Quick Evaluation

## 2020-10-30 ENCOUNTER — Encounter (HOSPITAL_COMMUNITY): Payer: Self-pay | Admitting: Urology

## 2020-10-31 LAB — SURGICAL PATHOLOGY

## 2020-10-31 LAB — CYTOLOGY - NON PAP

## 2020-11-19 ENCOUNTER — Other Ambulatory Visit: Payer: Self-pay | Admitting: Urology

## 2020-11-20 ENCOUNTER — Other Ambulatory Visit: Payer: Self-pay | Admitting: Urology

## 2020-11-20 DIAGNOSIS — C662 Malignant neoplasm of left ureter: Secondary | ICD-10-CM

## 2020-11-28 ENCOUNTER — Encounter (HOSPITAL_COMMUNITY): Payer: Self-pay

## 2020-11-28 ENCOUNTER — Ambulatory Visit (HOSPITAL_COMMUNITY)
Admission: RE | Admit: 2020-11-28 | Discharge: 2020-11-28 | Disposition: A | Discharge status: Home / Self Care | Payer: 59 | Source: Ambulatory Visit | Attending: Urology | Admitting: Urology

## 2020-11-28 ENCOUNTER — Other Ambulatory Visit: Payer: Self-pay

## 2020-11-28 ENCOUNTER — Other Ambulatory Visit: Payer: Self-pay | Admitting: Urology

## 2020-11-28 DIAGNOSIS — C662 Malignant neoplasm of left ureter: Secondary | ICD-10-CM | POA: Diagnosis not present

## 2020-11-28 LAB — POCT I-STAT CREATININE: Creatinine, Ser: 1.3 mg/dL — ABNORMAL HIGH (ref 0.61–1.24)

## 2020-11-28 MED ORDER — IOHEXOL 300 MG/ML  SOLN
75.0000 mL | Freq: Once | INTRAMUSCULAR | Status: AC | PRN
  Administered 2020-11-28: 75 mL via INTRAVENOUS

## 2020-12-06 NOTE — Progress Notes (Addendum)
COVID Vaccine Completed: x2 Date COVID Vaccine completed:  10-17-20 11-09-20 Has received booster: N/A COVID vaccine manufacturer: Pfizer     Date of COVID positive in last 90 days: N/A  PCP - [name], MD Cardiologist - N/A  Chest x-ray - CT chest 11-29-20 Epic EKG - 10-25-20 Epic Stress Test -  ECHO -  Cardiac Cath -  Pacemaker/ICD device last checked:  Sleep Study - N/A CPAP -   Fasting Blood Sugar -  Checks Blood Sugar - no longer checks, not on meds for three years.  CBG 190 at PAT  Blood Thinner Instructions:N/A Aspirin Instructions: Last Dose:  Activity level:  Can go up a flight of stairs and perform activities of daily living without stopping and without symptoms of chest pain or shortness of breath.  Patient does have severe back pain due to arthritis.     Anesthesia review: N/A Patient denies shortness of breath, fever, cough and chest pain at PAT appointment   Patient verbalized understanding of instructions that were given to them at the PAT appointment. Patient was also instructed that they will need to review over the PAT instructions again at home before surgery.

## 2020-12-06 NOTE — Patient Instructions (Addendum)
DUE TO COVID-19 ONLY ONE VISITOR IS ALLOWED TO COME WITH YOU AND STAY IN THE WAITING ROOM ONLY DURING PRE OP AND PROCEDURE.   IF YOU WILL BE ADMITTED INTO THE HOSPITAL YOU ARE ALLOWED ONE SUPPORT PERSON DURING VISITATION HOURS ONLY (10AM -8PM)   . The support person may change daily. . The support person must pass our screening, gel in and out, and wear a mask at all times, including in the patient's room. . Patients must also wear a mask when staff or their support person are in the room.   COVID SWAB TESTING MUST BE COMPLETED ON:  Monday, 12-10-20 @ 1:00 PM   [address] [place], [place] [address]  (Must self quarantine after testing. Follow instructions on handout.)    Your procedure is scheduled on:  Thursday, 12-13-20   Report to [facility] Main  Entrance    Report to admitting at 9:00 AM   Call this number if you have problems the morning of surgery [phone]   Do not eat food :After Midnight.   May have liquids until 8:00 AM day of surgery  CLEAR LIQUID DIET  Foods Allowed                                                                     Foods Excluded  Water, Black Coffee and tea, regular and decaf               liquids that you cannot  Plain Jell-O in any flavor  (No red)                                      see through such as: Fruit ices (not with fruit pulp)                                      milk, soups, orange juice              Iced Popsicles (No red)                                      All solid food                                   Apple juices Sports drinks like Gatorade (No red) Lightly seasoned clear broth or consume(fat free) Sugar, honey syrup      Oral Hygiene is also important to reduce your risk of infection.                                    Remember - BRUSH YOUR TEETH THE MORNING OF SURGERY WITH YOUR REGULAR TOOTHPASTE   Do NOT smoke after Midnight   Take these medicines the morning of surgery with A SIP OF WATER:  Atenolol,  Finasteride, Pantoprazole  You may not have any metal on your body including jewelry, and body piercings             Do not wear  lotions, powders, perfumes/cologne, or deodorant             Men may shave face and neck.   Do not bring valuables to the hospital. [place].   Contacts, dentures or bridgework may not be worn into surgery.   Bring small overnight bag day of surgery.                 Please read over the following fact sheets you were given: IF YOU HAVE QUESTIONS ABOUT YOUR PRE OP INSTRUCTIONS PLEASE CALL  [place] - Preparing for Surgery Before surgery, you can play an important role.  Because skin is not sterile, your skin needs to be as free of germs as possible.  You can reduce the number of germs on your skin by washing with CHG (chlorahexidine gluconate) soap before surgery.  CHG is an antiseptic cleaner which kills germs and bonds with the skin to continue killing germs even after washing. Please DO NOT use if you have an allergy to CHG or antibacterial soaps.  If your skin becomes reddened/irritated stop using the CHG and inform your nurse when you arrive at Short Stay. Do not shave (including legs and underarms) for at least 48 hours prior to the first CHG shower.  You may shave your face/neck.  Please follow these instructions carefully:  1.  Shower with CHG Soap the night before surgery and the  morning of surgery.  2.  If you choose to wash your hair, wash your hair first as usual with your normal  shampoo.  3.  After you shampoo, rinse your hair and body thoroughly to remove the shampoo.                             4.  Use CHG as you would any other liquid soap.  You can apply chg directly to the skin and wash.  Gently with a scrungie or clean washcloth.  5.  Apply the CHG Soap to your body ONLY FROM THE NECK DOWN.   Do   not use on face/ open                            Wound or open sores. Avoid contact with eyes, ears mouth and   genitals (private parts).                       Wash face,  Genitals (private parts) with your normal soap.             6.  Wash thoroughly, paying special attention to the area where your    surgery  will be performed.  7.  Thoroughly rinse your body with warm water from the neck down.  8.  DO NOT shower/wash with your normal soap after using and rinsing off the CHG Soap.                9.  Pat yourself dry with a clean towel.            10.  Wear clean pajamas.  11.  Place clean sheets on your bed the night of your first shower and do not  sleep with pets. Day of Surgery : Do not apply any lotions/deodorants the morning of surgery.  Please wear clean clothes to the hospital/surgery center.  FAILURE TO FOLLOW THESE INSTRUCTIONS MAY RESULT IN THE CANCELLATION OF YOUR SURGERY  PATIENT SIGNATURE_________________________________  NURSE SIGNATURE__________________________________  ________________________________________________________________________  WHAT IS A BLOOD TRANSFUSION? Blood Transfusion Information  A transfusion is the replacement of blood or some of its parts. Blood is made up of multiple cells which provide different functions.  Red blood cells carry oxygen and are used for blood loss replacement.  White blood cells fight against infection.  Platelets control bleeding.  Plasma helps clot blood.  Other blood products are available for specialized needs, such as hemophilia or other clotting disorders. BEFORE THE TRANSFUSION  Who gives blood for transfusions?   Healthy volunteers who are fully evaluated to make sure their blood is safe. This is blood bank blood. Transfusion therapy is the safest it has ever been in the practice of medicine. Before blood is taken from a donor, a complete history is taken to make sure that person has no history of diseases nor engages in risky social behavior  (examples are intravenous drug use or sexual activity with multiple partners). The donor's travel history is screened to minimize risk of transmitting infections, such as malaria. The donated blood is tested for signs of infectious diseases, such as HIV and hepatitis. The blood is then tested to be sure it is compatible with you in order to minimize the chance of a transfusion reaction. If you or a relative donates blood, this is often done in anticipation of surgery and is not appropriate for emergency situations. It takes many days to process the donated blood. RISKS AND COMPLICATIONS Although transfusion therapy is very safe and saves many lives, the main dangers of transfusion include:   Getting an infectious disease.  Developing a transfusion reaction. This is an allergic reaction to something in the blood you were given. Every precaution is taken to prevent this. The decision to have a blood transfusion has been considered carefully by your caregiver before blood is given. Blood is not given unless the benefits outweigh the risks. AFTER THE TRANSFUSION  Right after receiving a blood transfusion, you will usually feel much better and more energetic. This is especially true if your red blood cells have gotten low (anemic). The transfusion raises the level of the red blood cells which carry oxygen, and this usually causes an energy increase.  The nurse administering the transfusion will monitor you carefully for complications. HOME CARE INSTRUCTIONS  No special instructions are needed after a transfusion. You may find your energy is better. Speak with your caregiver about any limitations on activity for underlying diseases you may have. SEEK MEDICAL CARE IF:   Your condition is not improving after your transfusion.  You develop redness or irritation at the intravenous (IV) site. SEEK IMMEDIATE MEDICAL CARE IF:  Any of the following symptoms occur over the next 12 hours:  Shaking  chills.  You have a temperature by mouth above 102 F (38.9 C), not controlled by medicine.  Chest, back, or muscle pain.  People around you feel you are not acting correctly or are confused.  Shortness of breath or difficulty breathing.  Dizziness and fainting.  You get a rash or develop hives.  You have a decrease in urine output.  Your urine turns a dark  color or changes to pink, red, or brown. Any of the following symptoms occur over the next 10 days:  You have a temperature by mouth above 102 F (38.9 C), not controlled by medicine.  Shortness of breath.  Weakness after normal activity.  The white part of the eye turns yellow (jaundice).  You have a decrease in the amount of urine or are urinating less often.  Your urine turns a dark color or changes to pink, red, or brown. Document Released: 10/03/2000 Document Revised: 12/29/2011 Document Reviewed: 05/22/2008 [facility] Patient Information 2014 [place], [place].  _______________________________________________________________________

## 2020-12-07 ENCOUNTER — Other Ambulatory Visit: Payer: Self-pay

## 2020-12-07 ENCOUNTER — Encounter (HOSPITAL_COMMUNITY): Payer: Self-pay

## 2020-12-07 ENCOUNTER — Encounter (HOSPITAL_COMMUNITY)
Admission: RE | Admit: 2020-12-07 | Discharge: 2020-12-07 | Disposition: A | Discharge status: Home / Self Care | Payer: 59 | Source: Ambulatory Visit | Attending: Urology | Admitting: Urology

## 2020-12-07 DIAGNOSIS — Z01812 Encounter for preprocedural laboratory examination: Secondary | ICD-10-CM | POA: Insufficient documentation

## 2020-12-07 HISTORY — DX: Anemia, unspecified: D64.9

## 2020-12-07 HISTORY — DX: Malignant neoplasm of urinary organ, unspecified: C68.9

## 2020-12-07 LAB — CBC
HCT: 30.4 % — ABNORMAL LOW (ref 39.0–52.0)
Hemoglobin: 9.7 g/dL — ABNORMAL LOW (ref 13.0–17.0)
MCH: 27.2 pg (ref 26.0–34.0)
MCHC: 31.9 g/dL (ref 30.0–36.0)
MCV: 85.4 fL (ref 80.0–100.0)
Platelets: 127 10*3/uL — ABNORMAL LOW (ref 150–400)
RBC: 3.56 MIL/uL — ABNORMAL LOW (ref 4.22–5.81)
RDW: 14.6 % (ref 11.5–15.5)
WBC: 6.6 10*3/uL (ref 4.0–10.5)
nRBC: 0 % (ref 0.0–0.2)

## 2020-12-07 LAB — BASIC METABOLIC PANEL
Anion gap: 13 (ref 5–15)
BUN: 14 mg/dL (ref 6–20)
CO2: 23 mmol/L (ref 22–32)
Calcium: 8.8 mg/dL — ABNORMAL LOW (ref 8.9–10.3)
Chloride: 103 mmol/L (ref 98–111)
Creatinine, Ser: 1.35 mg/dL — ABNORMAL HIGH (ref 0.61–1.24)
GFR, Estimated: >60 mL/min (ref >60)
Glucose, Bld: 189 mg/dL — ABNORMAL HIGH (ref 70–99)
Potassium: 4.1 mmol/L (ref 3.5–5.1)
Sodium: 139 mmol/L (ref 135–145)

## 2020-12-07 LAB — GLUCOSE, CAPILLARY: Glucose-Capillary: 190 mg/dL — ABNORMAL HIGH (ref 70–99)

## 2020-12-07 NOTE — Progress Notes (Signed)
CBC sent to Dr. Alinda Money to review.

## 2020-12-10 ENCOUNTER — Other Ambulatory Visit (HOSPITAL_COMMUNITY)
Admission: RE | Admit: 2020-12-10 | Discharge: 2020-12-10 | Disposition: A | Discharge status: Home / Self Care | Payer: 59 | Source: Ambulatory Visit | Attending: Urology | Admitting: Urology

## 2020-12-10 DIAGNOSIS — Z20822 Contact with and (suspected) exposure to covid-19: Secondary | ICD-10-CM | POA: Insufficient documentation

## 2020-12-10 DIAGNOSIS — Z01812 Encounter for preprocedural laboratory examination: Secondary | ICD-10-CM | POA: Insufficient documentation

## 2020-12-11 LAB — SARS CORONAVIRUS 2 (TAT 6-24 HRS): SARS Coronavirus 2: NEGATIVE

## 2020-12-12 ENCOUNTER — Encounter (HOSPITAL_COMMUNITY): Payer: Self-pay | Admitting: Urology

## 2020-12-12 NOTE — H&P (Signed)
Urothelial carcinoma of the left ureter   Robert Burnett is a 60 year old gentleman who presented with gross hematuria and was found to have a large left ureteral tumor in the mid/distal ureter with a poorly functional left kidney. He returns today after undergoing left ureteroscopy, biopsy and left ureteral stenting. His pathology confirmed low grade, papillary urothelial carcinoma. No evidence of metastatic disease to the abdomen or pelvis was noted. Baseline Cr is 1.24.   PMH: Hypertension, GERD, hyperlipidemia, depression, arthritis (chronic narcotic therapy)  PSH: No abdominal surgery     ALLERGIES: Blood Pressure Med (Can't Remember The Name Of It) Prozac - Skin Rash    MEDICATIONS: Aspirin 325 mg tablet  Finasteride 5 mg tablet 1 tablet PO Daily  Atenolol 100 mg tablet  Fentanyl  Losartan-Hydrochlorothiazide 50 mg-12.5 mg tablet  Oxycodone Hcl 10 mg tablet  Protonix 40 mg tablet, delayed release     GU PSH: Cysto Uretero Biopsy Fulgura, Left - 10/29/2020 Cystoscopy - 09/12/2020 Cystoscopy Insert Stent, Left - 10/29/2020     NON-GU PSH: Foot/toes Surgery Procedure, Left, 2nd toe removed     GU PMH: BPH w/o LUTS - 09/12/2020 Gross hematuria - 09/12/2020, - 04/12/2020 Kidney Failure Unspec      PMH Notes:   1) Urothelial carcinoma: He initially presented to [facility] in the summer of 2021 with painless, gross hematuria. Despite recommendations and due to financial concerns, he did not follow up as recommended initially. He presented to me in November 2021 with recurrent gross hematuria and clots. He agreed to undergo an evaluation with CT imaging and cystoscopy which revealed findings consistent with a large left ureteral tumor and poorly functional left kidney.   Nov-Dec 2021: Cystoscopy - unremarkable, CT imaging - Large mid/distal left ureteral tumor with poorly functional left kidney, no evidence of metastatic or locally advanced disease.  Jan 2022: Left  ureteroscopy - Cytology (atypical), Biopsy (low grade papillary urothelial carcinoma), Left ureteral stent   NON-GU PMH: Arthritis Depression GERD Hypercholesterolemia Hypertension    FAMILY HISTORY: Bone Cancer - Grandmother Breast Cancer - Mother Death In The Family Father - Father Death In The Family Mother - Mother Lung Cancer - Father pancreatic cancer - Sister   SOCIAL HISTORY: Marital Status: Single Preferred Language: English; Ethnicity: Not Hispanic Or Latino; Race: White Current Smoking Status: Patient does not smoke anymore. Has not smoked since 03/20/2018. Smoked for 8 years. Smoked 1 pack per day.   Tobacco Use Assessment Completed: Used Tobacco in last 30 days? Drinks 3 caffeinated drinks per day. Patient's occupation is/was disabled.     Notes: Smokes a pipe occasionally   REVIEW OF SYSTEMS:    GU Review Male:   Patient denies frequent urination, hard to postpone urination, burning/ pain with urination, get up at night to urinate, leakage of urine, stream starts and stops, trouble starting your streams, and have to strain to urinate .  Gastrointestinal (Lower):   Patient denies diarrhea and constipation.  Gastrointestinal (Upper):   Patient denies nausea and vomiting.  Constitutional:   Patient denies fever, night sweats, weight loss, and fatigue.  Skin:   Patient denies skin rash/ lesion and itching.  Eyes:   Patient denies blurred vision and double vision.  Ears/ Nose/ Throat:   Patient denies sore throat and sinus problems.  Hematologic/Lymphatic:   Patient denies swollen glands and easy bruising.  Cardiovascular:   Patient denies chest pains and leg swelling.  Respiratory:   Patient denies cough and shortness  of breath.  Endocrine:   Patient denies excessive thirst.  Musculoskeletal:   Patient denies back pain and joint pain.  Neurological:   Patient denies headaches and dizziness.  Psychologic:   Patient denies depression and anxiety.   VITAL SIGNS:      Weight 197 lb / 89.36 kg  Height 73 in / 185.42 cm  BMI 26.0 kg/m   MULTI-SYSTEM PHYSICAL EXAMINATION:    Constitutional: Well-nourished. No physical deformities. Normally developed. Good grooming.  Respiratory: No labored breathing, no use of accessory muscles. Clear bilaterally.  Cardiovascular: Normal temperature, normal extremity pulses, no swelling, no varicosities. Regular rate and rhythm.  Gastrointestinal: No mass, no tenderness, no rigidity, non obese abdomen.      Complexity of Data:  Records Review:   Pathology Reports, Previous Patient Records       ASSESSMENT:      ICD-10 Details  1 GU:   Left ureteral cancer - C66.2    PLAN:        1. Urothelial carcinoma of the left ureter: We reviewed his pathology indicating a low-grade urothelial carcinoma. Considering the fact that he appears to have a poorly functional left kidney and considering the large size of his left ureteral tumor, I have recommended that he proceed with a left robot assisted laparoscopic nephro ureterectomy for definitive therapy. He will undergo CT imaging of the chest to exclude metastatic disease prior to treatment. We have discussed this surgery in detail including the potential risks associated with general anesthesia related to cardiopulmonary risks, postoperative venothromboembolism, etc. We have also discussed the risks of damage to adjacent organs/structures, risk of worsening renal function, risk of cancer recurrence and need for further therapy, risk of urine leak, and the expected postoperative recovery process. He agrees to proceed as recommended. He will also receive postoperative intravesical gemcitabine.

## 2020-12-13 ENCOUNTER — Encounter (HOSPITAL_COMMUNITY): Payer: Self-pay | Admitting: Urology

## 2020-12-13 ENCOUNTER — Other Ambulatory Visit (HOSPITAL_COMMUNITY): Payer: Self-pay | Admitting: Physician Assistant

## 2020-12-13 ENCOUNTER — Other Ambulatory Visit: Payer: Self-pay

## 2020-12-13 ENCOUNTER — Inpatient Hospital Stay (HOSPITAL_COMMUNITY): Payer: 59 | Admitting: Anesthesiology

## 2020-12-13 ENCOUNTER — Encounter (HOSPITAL_COMMUNITY)
Admission: RE | Disposition: A | Discharge status: Home / Self Care | Payer: Self-pay | Source: Home / Self Care | Attending: Urology

## 2020-12-13 ENCOUNTER — Inpatient Hospital Stay (HOSPITAL_COMMUNITY)
Admission: RE | Admit: 2020-12-13 | Discharge: 2020-12-16 | DRG: 658 | Disposition: A | Discharge status: Home / Self Care | Payer: 59 | Attending: Urology | Admitting: Urology

## 2020-12-13 DIAGNOSIS — Z801 Family history of malignant neoplasm of trachea, bronchus and lung: Secondary | ICD-10-CM | POA: Diagnosis not present

## 2020-12-13 DIAGNOSIS — Z79899 Other long term (current) drug therapy: Secondary | ICD-10-CM | POA: Diagnosis not present

## 2020-12-13 DIAGNOSIS — F17290 Nicotine dependence, other tobacco product, uncomplicated: Secondary | ICD-10-CM | POA: Diagnosis present

## 2020-12-13 DIAGNOSIS — C662 Malignant neoplasm of left ureter: Secondary | ICD-10-CM | POA: Diagnosis present

## 2020-12-13 DIAGNOSIS — K219 Gastro-esophageal reflux disease without esophagitis: Secondary | ICD-10-CM | POA: Diagnosis present

## 2020-12-13 DIAGNOSIS — N401 Enlarged prostate with lower urinary tract symptoms: Secondary | ICD-10-CM | POA: Diagnosis present

## 2020-12-13 DIAGNOSIS — Z20822 Contact with and (suspected) exposure to covid-19: Secondary | ICD-10-CM | POA: Diagnosis present

## 2020-12-13 DIAGNOSIS — I10 Essential (primary) hypertension: Secondary | ICD-10-CM | POA: Diagnosis present

## 2020-12-13 DIAGNOSIS — Z7982 Long term (current) use of aspirin: Secondary | ICD-10-CM | POA: Diagnosis not present

## 2020-12-13 DIAGNOSIS — Z79891 Long term (current) use of opiate analgesic: Secondary | ICD-10-CM

## 2020-12-13 DIAGNOSIS — G894 Chronic pain syndrome: Secondary | ICD-10-CM

## 2020-12-13 DIAGNOSIS — E785 Hyperlipidemia, unspecified: Secondary | ICD-10-CM | POA: Diagnosis present

## 2020-12-13 DIAGNOSIS — Z800 Family history of malignant neoplasm of digestive organs: Secondary | ICD-10-CM

## 2020-12-13 DIAGNOSIS — E7800 Pure hypercholesterolemia, unspecified: Secondary | ICD-10-CM | POA: Diagnosis present

## 2020-12-13 HISTORY — PX: ROBOT ASSITED LAPAROSCOPIC NEPHROURETERECTOMY: SHX6077

## 2020-12-13 LAB — BASIC METABOLIC PANEL
Anion gap: 12 (ref 5–15)
BUN: 21 mg/dL — ABNORMAL HIGH (ref 6–20)
CO2: 22 mmol/L (ref 22–32)
Calcium: 8.5 mg/dL — ABNORMAL LOW (ref 8.9–10.3)
Chloride: 102 mmol/L (ref 98–111)
Creatinine, Ser: 1.49 mg/dL — ABNORMAL HIGH (ref 0.61–1.24)
GFR, Estimated: 53 mL/min — ABNORMAL LOW (ref >60)
Glucose, Bld: 258 mg/dL — ABNORMAL HIGH (ref 70–99)
Potassium: 4.4 mmol/L (ref 3.5–5.1)
Sodium: 136 mmol/L (ref 135–145)

## 2020-12-13 LAB — GLUCOSE, CAPILLARY
Glucose-Capillary: 168 mg/dL — ABNORMAL HIGH (ref 70–99)
Glucose-Capillary: 264 mg/dL — ABNORMAL HIGH (ref 70–99)
Glucose-Capillary: 237 mg/dL — ABNORMAL HIGH (ref 70–99)

## 2020-12-13 LAB — HEMOGLOBIN AND HEMATOCRIT, BLOOD
HCT: 29.4 % — ABNORMAL LOW (ref 39.0–52.0)
Hemoglobin: 9.2 g/dL — ABNORMAL LOW (ref 13.0–17.0)

## 2020-12-13 LAB — TYPE AND SCREEN
ABO/RH(D): AB POS
Antibody Screen: NEGATIVE

## 2020-12-13 LAB — ABO/RH: ABO/RH(D): AB POS

## 2020-12-13 SURGERY — NEPHROURETERECTOMY, ROBOT-ASSISTED, LAPAROSCOPIC
Anesthesia: General | Laterality: Left

## 2020-12-13 MED ORDER — CEFAZOLIN SODIUM-DEXTROSE 1-4 GM/50ML-% IV SOLN
1.0000 g | Freq: Three times a day (TID) | INTRAVENOUS | Status: AC
Start: 2020-12-13 — End: 2020-12-14
  Administered 2020-12-13 – 2020-12-14 (×2): 1 g via INTRAVENOUS
  Filled 2020-12-13 (×2): qty 50

## 2020-12-13 MED ORDER — DIPHENHYDRAMINE HCL 12.5 MG/5ML PO ELIX: 12.5000 mg | ORAL_SOLUTION | Freq: Four times a day (QID) | ORAL | Status: DC | PRN

## 2020-12-13 MED ORDER — ACETAMINOPHEN 10 MG/ML IV SOLN: 1000.0000 mg | Freq: Once | INTRAVENOUS | Status: DC | PRN

## 2020-12-13 MED ORDER — MIDAZOLAM HCL 5 MG/5ML IJ SOLN
INTRAMUSCULAR | Status: DC | PRN
  Administered 2020-12-13: 2 mg via INTRAVENOUS

## 2020-12-13 MED ORDER — HYDROMORPHONE HCL 1 MG/ML IJ SOLN
INTRAMUSCULAR | Status: AC
  Administered 2020-12-13: 0.5 mg via INTRAVENOUS
  Filled 2020-12-13: qty 2

## 2020-12-13 MED ORDER — HYDROMORPHONE HCL 1 MG/ML IJ SOLN: 0.2500 mg | INTRAMUSCULAR | Status: DC | PRN

## 2020-12-13 MED ORDER — SUGAMMADEX SODIUM 200 MG/2ML IV SOLN
INTRAVENOUS | Status: DC | PRN
  Administered 2020-12-13: 200 mg via INTRAVENOUS

## 2020-12-13 MED ORDER — ACETAMINOPHEN 10 MG/ML IV SOLN
1000.0000 mg | Freq: Four times a day (QID) | INTRAVENOUS | Status: DC
  Administered 2020-12-13 – 2020-12-14 (×2): 1000 mg via INTRAVENOUS
  Filled 2020-12-13 (×2): qty 100

## 2020-12-13 MED ORDER — BUPIVACAINE-EPINEPHRINE 0.25% -1:200000 IJ SOLN
INTRAMUSCULAR | Status: AC
  Filled 2020-12-13: qty 1

## 2020-12-13 MED ORDER — HYDROMORPHONE HCL 2 MG/ML IJ SOLN
INTRAMUSCULAR | Status: AC
  Filled 2020-12-13: qty 1

## 2020-12-13 MED ORDER — DEXAMETHASONE SODIUM PHOSPHATE 10 MG/ML IJ SOLN
INTRAMUSCULAR | Status: DC | PRN
  Administered 2020-12-13: 8 mg via INTRAVENOUS

## 2020-12-13 MED ORDER — OXYCODONE HCL 5 MG/5ML PO SOLN: 5.0000 mg | Freq: Once | ORAL | Status: DC | PRN

## 2020-12-13 MED ORDER — FENTANYL 75 MCG/HR TD PT72
1.0000 | MEDICATED_PATCH | TRANSDERMAL | Status: DC
Start: 2020-12-13 — End: 2020-12-16
  Administered 2020-12-13: 1 via TRANSDERMAL
  Filled 2020-12-13: qty 1

## 2020-12-13 MED ORDER — ATORVASTATIN CALCIUM 40 MG PO TABS
40.0000 mg | ORAL_TABLET | Freq: Every day | ORAL | Status: DC
  Administered 2020-12-13 – 2020-12-15 (×3): 40 mg via ORAL
  Filled 2020-12-13 (×3): qty 1

## 2020-12-13 MED ORDER — DOCUSATE SODIUM 100 MG PO CAPS
100.0000 mg | ORAL_CAPSULE | Freq: Two times a day (BID) | ORAL | Status: DC
  Administered 2020-12-13 – 2020-12-16 (×6): 100 mg via ORAL
  Filled 2020-12-13 (×6): qty 1

## 2020-12-13 MED ORDER — AMISULPRIDE (ANTIEMETIC) 5 MG/2ML IV SOLN: 10.0000 mg | Freq: Once | INTRAVENOUS | Status: DC | PRN

## 2020-12-13 MED ORDER — OXYCODONE HCL 5 MG PO TABS: 5.0000 mg | ORAL_TABLET | Freq: Once | ORAL | Status: DC | PRN

## 2020-12-13 MED ORDER — BUPIVACAINE LIPOSOME 1.3 % IJ SUSP
20.0000 mL | Freq: Once | INTRAMUSCULAR | Status: AC
  Administered 2020-12-13: 20 mL
  Filled 2020-12-13: qty 20

## 2020-12-13 MED ORDER — DEXTROSE-NACL 5-0.45 % IV SOLN: INTRAVENOUS | Status: DC

## 2020-12-13 MED ORDER — SODIUM CHLORIDE (PF) 0.9 % IJ SOLN
INTRAMUSCULAR | Status: AC
  Filled 2020-12-13: qty 10

## 2020-12-13 MED ORDER — STERILE WATER FOR IRRIGATION IR SOLN
Status: DC | PRN
  Administered 2020-12-13: 1000 mL

## 2020-12-13 MED ORDER — HYDROMORPHONE HCL 1 MG/ML IJ SOLN
0.2500 mg | INTRAMUSCULAR | Status: DC | PRN
  Administered 2020-12-13 (×3): 0.5 mg via INTRAVENOUS

## 2020-12-13 MED ORDER — LACTATED RINGERS IV SOLN: INTRAVENOUS | Status: DC

## 2020-12-13 MED ORDER — ONDANSETRON HCL 4 MG/2ML IJ SOLN
INTRAMUSCULAR | Status: DC | PRN
  Administered 2020-12-13: 4 mg via INTRAVENOUS

## 2020-12-13 MED ORDER — LOSARTAN POTASSIUM-HCTZ 50-12.5 MG PO TABS: 1.0000 | ORAL_TABLET | Freq: Every day | ORAL | Status: DC

## 2020-12-13 MED ORDER — KETAMINE HCL 10 MG/ML IJ SOLN
INTRAMUSCULAR | Status: DC | PRN
  Administered 2020-12-13: 20 mg via INTRAVENOUS
  Administered 2020-12-13: 30 mg via INTRAVENOUS
  Administered 2020-12-13: 20 mg via INTRAVENOUS

## 2020-12-13 MED ORDER — SODIUM CHLORIDE (PF) 0.9 % IJ SOLN
INTRAMUSCULAR | Status: DC | PRN
  Administered 2020-12-13: 20 mL via INTRAVENOUS

## 2020-12-13 MED ORDER — CEFAZOLIN SODIUM-DEXTROSE 2-4 GM/100ML-% IV SOLN
2.0000 g | Freq: Once | INTRAVENOUS | Status: AC
  Administered 2020-12-13: 2 g via INTRAVENOUS
  Filled 2020-12-13: qty 100

## 2020-12-13 MED ORDER — CHLORHEXIDINE GLUCONATE 0.12 % MT SOLN
15.0000 mL | Freq: Once | OROMUCOSAL | Status: AC
  Administered 2020-12-13: 15 mL via OROMUCOSAL

## 2020-12-13 MED ORDER — LIDOCAINE HCL (PF) 2 % IJ SOLN
INTRAMUSCULAR | Status: AC
  Filled 2020-12-13: qty 15

## 2020-12-13 MED ORDER — LACTATED RINGERS IV SOLN: INTRAVENOUS | Status: DC | PRN

## 2020-12-13 MED ORDER — INSULIN ASPART 100 UNIT/ML ~~LOC~~ SOLN: 5.0000 [IU] | Freq: Once | SUBCUTANEOUS | Status: AC

## 2020-12-13 MED ORDER — DIPHENHYDRAMINE HCL 50 MG/ML IJ SOLN
12.5000 mg | Freq: Four times a day (QID) | INTRAMUSCULAR | Status: DC | PRN
  Administered 2020-12-14: 12.5 mg via INTRAVENOUS
  Filled 2020-12-13: qty 1

## 2020-12-13 MED ORDER — LACTATED RINGERS IR SOLN
Status: DC | PRN
  Administered 2020-12-13: 1

## 2020-12-13 MED ORDER — FINASTERIDE 5 MG PO TABS
5.0000 mg | ORAL_TABLET | Freq: Every day | ORAL | Status: DC
  Administered 2020-12-14 – 2020-12-16 (×3): 5 mg via ORAL
  Filled 2020-12-13 (×3): qty 1

## 2020-12-13 MED ORDER — CHLORHEXIDINE GLUCONATE CLOTH 2 % EX PADS
6.0000 | MEDICATED_PAD | Freq: Every day | CUTANEOUS | Status: DC
  Administered 2020-12-14 – 2020-12-16 (×3): 6 via TOPICAL

## 2020-12-13 MED ORDER — FENTANYL CITRATE (PF) 250 MCG/5ML IJ SOLN
INTRAMUSCULAR | Status: AC
  Filled 2020-12-13: qty 5

## 2020-12-13 MED ORDER — HYDROCHLOROTHIAZIDE 12.5 MG PO CAPS
12.5000 mg | ORAL_CAPSULE | Freq: Every day | ORAL | Status: DC
  Administered 2020-12-13 – 2020-12-16 (×4): 12.5 mg via ORAL
  Filled 2020-12-13 (×4): qty 1

## 2020-12-13 MED ORDER — LOSARTAN POTASSIUM 50 MG PO TABS
50.0000 mg | ORAL_TABLET | Freq: Every day | ORAL | Status: DC
  Administered 2020-12-13 – 2020-12-16 (×4): 50 mg via ORAL
  Filled 2020-12-13 (×4): qty 1

## 2020-12-13 MED ORDER — ROCURONIUM BROMIDE 10 MG/ML (PF) SYRINGE
PREFILLED_SYRINGE | INTRAVENOUS | Status: AC
  Filled 2020-12-13: qty 10

## 2020-12-13 MED ORDER — BUPIVACAINE-EPINEPHRINE 0.25% -1:200000 IJ SOLN
INTRAMUSCULAR | Status: DC | PRN
  Administered 2020-12-13: 9 mL

## 2020-12-13 MED ORDER — HYDROMORPHONE HCL 1 MG/ML IJ SOLN
INTRAMUSCULAR | Status: DC | PRN
  Administered 2020-12-13: 1 mg via INTRAVENOUS

## 2020-12-13 MED ORDER — ONDANSETRON HCL 4 MG/2ML IJ SOLN: 4.0000 mg | INTRAMUSCULAR | Status: DC | PRN

## 2020-12-13 MED ORDER — EPHEDRINE SULFATE-NACL 50-0.9 MG/10ML-% IV SOSY
PREFILLED_SYRINGE | INTRAVENOUS | Status: DC | PRN
  Administered 2020-12-13: 10 mg via INTRAVENOUS

## 2020-12-13 MED ORDER — ORAL CARE MOUTH RINSE: 15.0000 mL | Freq: Once | OROMUCOSAL | Status: AC

## 2020-12-13 MED ORDER — KETAMINE HCL 10 MG/ML IJ SOLN
INTRAMUSCULAR | Status: AC
  Filled 2020-12-13: qty 1

## 2020-12-13 MED ORDER — ACETAMINOPHEN 10 MG/ML IV SOLN
INTRAVENOUS | Status: AC
  Administered 2020-12-13: 1000 mg via INTRAVENOUS
  Filled 2020-12-13: qty 100

## 2020-12-13 MED ORDER — OXYCODONE HCL 10 MG PO TABS: 10.0000 mg | ORAL_TABLET | ORAL | 0 refills | Status: DC | PRN

## 2020-12-13 MED ORDER — PROPOFOL 10 MG/ML IV BOLUS
INTRAVENOUS | Status: AC
  Filled 2020-12-13: qty 20

## 2020-12-13 MED ORDER — MIDAZOLAM HCL 2 MG/2ML IJ SOLN
INTRAMUSCULAR | Status: AC
  Filled 2020-12-13: qty 2

## 2020-12-13 MED ORDER — FENTANYL CITRATE (PF) 100 MCG/2ML IJ SOLN
INTRAMUSCULAR | Status: DC | PRN
  Administered 2020-12-13: 150 ug via INTRAVENOUS
  Administered 2020-12-13 (×2): 50 ug via INTRAVENOUS

## 2020-12-13 MED ORDER — INSULIN ASPART 100 UNIT/ML ~~LOC~~ SOLN
SUBCUTANEOUS | Status: AC
  Administered 2020-12-13: 5 [IU] via SUBCUTANEOUS
  Filled 2020-12-13: qty 1

## 2020-12-13 MED ORDER — PROPOFOL 10 MG/ML IV BOLUS
INTRAVENOUS | Status: DC | PRN
  Administered 2020-12-13: 130 mg via INTRAVENOUS

## 2020-12-13 MED ORDER — PHENYLEPHRINE HCL (PRESSORS) 10 MG/ML IV SOLN
INTRAVENOUS | Status: AC
  Filled 2020-12-13: qty 1

## 2020-12-13 MED ORDER — PANTOPRAZOLE SODIUM 40 MG PO TBEC
40.0000 mg | DELAYED_RELEASE_TABLET | Freq: Every day | ORAL | Status: DC
  Administered 2020-12-14 – 2020-12-16 (×3): 40 mg via ORAL
  Filled 2020-12-13 (×3): qty 1

## 2020-12-13 MED ORDER — ATENOLOL 50 MG PO TABS
50.0000 mg | ORAL_TABLET | Freq: Every day | ORAL | Status: DC
  Administered 2020-12-14 – 2020-12-16 (×3): 50 mg via ORAL
  Filled 2020-12-13 (×3): qty 1

## 2020-12-13 MED ORDER — ONDANSETRON HCL 4 MG/2ML IJ SOLN: 4.0000 mg | Freq: Once | INTRAMUSCULAR | Status: DC | PRN

## 2020-12-13 MED ORDER — HYDROMORPHONE HCL 1 MG/ML IJ SOLN
0.5000 mg | INTRAMUSCULAR | Status: DC | PRN
  Administered 2020-12-13 – 2020-12-14 (×5): 1 mg via INTRAVENOUS
  Filled 2020-12-13 (×5): qty 1

## 2020-12-13 MED ORDER — ROCURONIUM BROMIDE 10 MG/ML (PF) SYRINGE
PREFILLED_SYRINGE | INTRAVENOUS | Status: DC | PRN
  Administered 2020-12-13: 70 mg via INTRAVENOUS
  Administered 2020-12-13: 20 mg via INTRAVENOUS
  Administered 2020-12-13: 10 mg via INTRAVENOUS
  Administered 2020-12-13: 30 mg via INTRAVENOUS
  Administered 2020-12-13: 20 mg via INTRAVENOUS

## 2020-12-13 MED ORDER — LIDOCAINE 2% (20 MG/ML) 5 ML SYRINGE
INTRAMUSCULAR | Status: DC | PRN
  Administered 2020-12-13: 80 mg via INTRAVENOUS
  Administered 2020-12-13: 1.5 mg/kg/h via INTRAVENOUS

## 2020-12-13 MED ORDER — GEMCITABINE CHEMO FOR BLADDER INSTILLATION 2000 MG
2000.0000 mg | Freq: Once | INTRAVENOUS | Status: AC
  Administered 2020-12-13: 2000 mg via INTRAVESICAL
  Filled 2020-12-13: qty 2000

## 2020-12-13 MED ORDER — EPHEDRINE 5 MG/ML INJ
INTRAVENOUS | Status: AC
  Filled 2020-12-13: qty 10

## 2020-12-13 MED FILL — oxyCODONE HCL 10 MG TABS: 10 | 2 days supply | Qty: 10 | Fill #0

## 2020-12-13 SURGICAL SUPPLY — 65 items
BAG LAPAROSCOPIC 12 15 PORT 16 (BASKET) ×1 IMPLANT
BAG RETRIEVAL 12/15 (BASKET) ×2
CATH 18FR 3 WAY 30 (CATHETERS) ×2
CATH 18FR 3WAY 30CC (CATHETERS) ×1 IMPLANT
CATH FOLEY 3WAY  5CC 18FR (CATHETERS)
CATH FOLEY 3WAY 5CC 18FR (CATHETERS) IMPLANT
CHLORAPREP W/TINT 26 (MISCELLANEOUS) ×2 IMPLANT
CLIP VESOLOCK LG 6/CT PURPLE (CLIP) ×4 IMPLANT
CLIP VESOLOCK MED LG 6/CT (CLIP) ×2 IMPLANT
COVER SURGICAL LIGHT HANDLE (MISCELLANEOUS) ×2 IMPLANT
COVER TIP SHEARS 8 DVNC (MISCELLANEOUS) ×1 IMPLANT
COVER TIP SHEARS 8MM DA VINCI (MISCELLANEOUS) ×2
COVER WAND RF STERILE (DRAPES) IMPLANT
CUTTER ECHEON FLEX ENDO 45 340 (ENDOMECHANICALS) ×2 IMPLANT
CUTTER FLEX LINEAR 45M (STAPLE) IMPLANT
DECANTER SPIKE VIAL GLASS SM (MISCELLANEOUS) ×2 IMPLANT
DERMABOND ADVANCED (GAUZE/BANDAGES/DRESSINGS) ×1
DERMABOND ADVANCED .7 DNX12 (GAUZE/BANDAGES/DRESSINGS) ×1 IMPLANT
DRAIN CHANNEL 15F RND FF 3/16 (WOUND CARE) ×2 IMPLANT
DRAPE ARM DVNC X/XI (DISPOSABLE) ×4 IMPLANT
DRAPE COLUMN DVNC XI (DISPOSABLE) ×1 IMPLANT
DRAPE DA VINCI XI ARM (DISPOSABLE) ×8
DRAPE DA VINCI XI COLUMN (DISPOSABLE) ×2
DRAPE INCISE IOBAN 66X45 STRL (DRAPES) ×2 IMPLANT
DRAPE LAPAROSCOPIC ABDOMINAL (DRAPES) ×2 IMPLANT
DRAPE SHEET LG 3/4 BI-LAMINATE (DRAPES) ×2 IMPLANT
DRSG TEGADERM 4X4.75 (GAUZE/BANDAGES/DRESSINGS) ×2 IMPLANT
ELECT PENCIL ROCKER SW 15FT (MISCELLANEOUS) ×2 IMPLANT
ELECT REM PT RETURN 15FT ADLT (MISCELLANEOUS) ×2 IMPLANT
EVACUATOR SILICONE 100CC (DRAIN) ×2 IMPLANT
GLOVE SURG ENC MOIS LTX SZ6.5 (GLOVE) ×2 IMPLANT
GLOVE SURG ENC TEXT LTX SZ7.5 (GLOVE) ×4 IMPLANT
GOWN STRL REUS W/TWL LRG LVL3 (GOWN DISPOSABLE) ×6 IMPLANT
IRRIG SUCT STRYKERFLOW 2 WTIP (MISCELLANEOUS)
IRRIGATION SUCT STRKRFLW 2 WTP (MISCELLANEOUS) IMPLANT
KIT BASIN OR (CUSTOM PROCEDURE TRAY) ×2 IMPLANT
KIT TURNOVER KIT A (KITS) ×2 IMPLANT
NS IRRIG 1000ML POUR BTL (IV SOLUTION) ×2 IMPLANT
PENCIL SMOKE EVACUATOR (MISCELLANEOUS) IMPLANT
PLUG CATH AND CAP STER (CATHETERS) ×4 IMPLANT
PROTECTOR NERVE ULNAR (MISCELLANEOUS) ×4 IMPLANT
RELOAD 45 VASCULAR/THIN (ENDOMECHANICALS) IMPLANT
SEAL CANN UNIV 5-8 DVNC XI (MISCELLANEOUS) ×5 IMPLANT
SEAL XI 5MM-8MM UNIVERSAL (MISCELLANEOUS) ×10
SET IRRIG Y TYPE TUR BLADDER L (SET/KITS/TRAYS/PACK) ×2 IMPLANT
SET TUBE SMOKE EVAC HIGH FLOW (TUBING) ×2 IMPLANT
SLEEVE SURGEON STRL (DRAPES) ×2 IMPLANT
SOLUTION ELECTROLUBE (MISCELLANEOUS) ×2 IMPLANT
STAPLE RELOAD 45 WHT (STAPLE) ×1 IMPLANT
STAPLE RELOAD 45MM WHITE (STAPLE) ×2
SUT ETHILON 3 0 PS 1 (SUTURE) ×2 IMPLANT
SUT MNCRL AB 4-0 PS2 18 (SUTURE) ×4 IMPLANT
SUT PDS AB 1 CTX 36 (SUTURE) ×4 IMPLANT
SUT V-LOC BARB 180 2/0GR6 GS22 (SUTURE)
SUT VIC AB 0 CT1 27 (SUTURE) ×2
SUT VIC AB 0 CT1 27XBRD ANTBC (SUTURE) ×1 IMPLANT
SUT VICRYL 0 UR6 27IN ABS (SUTURE) ×2 IMPLANT
SUT VLOC BARB 180 ABS3/0GR12 (SUTURE) ×2
SUTURE V-LC BRB 180 2/0GR6GS22 (SUTURE) IMPLANT
SUTURE VLOC BRB 180 ABS3/0GR12 (SUTURE) ×1 IMPLANT
TOWEL OR NON WOVEN STRL DISP B (DISPOSABLE) ×2 IMPLANT
TRAY FOLEY MTR SLVR 16FR STAT (SET/KITS/TRAYS/PACK) ×2 IMPLANT
TRAY LAPAROSCOPIC (CUSTOM PROCEDURE TRAY) ×2 IMPLANT
TROCAR XCEL 12X100 BLDLESS (ENDOMECHANICALS) ×4 IMPLANT
WATER STERILE IRR 1000ML POUR (IV SOLUTION) ×4 IMPLANT

## 2020-12-13 NOTE — Transfer of Care (Signed)
Immediate Anesthesia Transfer of Care Note  Patient: Robert Burnett  Procedure(s) Performed: XI ROBOT ASSITED LAPAROSCOPIC NEPHROURETERECTOMY , POST OPERATIVE INTRAVESICAL GEMCITABINE (Left )  Patient Location: PACU  Anesthesia Type:General  Level of Consciousness: drowsy  Airway & Oxygen Therapy: Patient Spontanous Breathing and Patient connected to face mask oxygen  Post-op Assessment: Report given to RN and Post -op Vital signs reviewed and stable  Post vital signs: Reviewed and stable  Last Vitals:  Vitals Value Taken Time  BP 139/97 12/13/20 1507  Temp    Pulse 113 12/13/20 1514  Resp 18 12/13/20 1514  SpO2 90 % 12/13/20 1514  Vitals shown include unvalidated device data.  Last Pain:  Vitals:   12/13/20 0939  TempSrc: Oral         Complications: No complications documented.

## 2020-12-13 NOTE — Anesthesia Postprocedure Evaluation (Signed)
Anesthesia Post Note  Patient: Robert Burnett  Procedure(s) Performed: XI ROBOT ASSITED LAPAROSCOPIC NEPHROURETERECTOMY , POST OPERATIVE INTRAVESICAL GEMCITABINE (Left )     Patient location during evaluation: PACU Anesthesia Type: General Level of consciousness: awake and alert Pain management: pain level controlled Vital Signs Assessment: post-procedure vital signs reviewed and stable Respiratory status: spontaneous breathing, nonlabored ventilation, respiratory function stable and patient connected to nasal cannula oxygen Cardiovascular status: blood pressure returned to baseline and stable Postop Assessment: no apparent nausea or vomiting Anesthetic complications: no   No complications documented.  Last Vitals:  Vitals:   12/13/20 1715 12/13/20 1759  BP: (!) 150/77 (!) 147/94  Pulse: 70 77  Resp: 12 20  Temp: 36.7 C 36.8 C  SpO2: 100% 96%    Last Pain:  Vitals:   12/13/20 1759  TempSrc: Oral  PainSc:                  [place]

## 2020-12-13 NOTE — Anesthesia Preprocedure Evaluation (Addendum)
Anesthesia Evaluation  Patient identified by MRN, date of birth, ID band Patient awake    Reviewed: Allergy & Precautions, NPO status , Patient's Chart, lab work & pertinent test results  Airway Mallampati: II  TM Distance: >3 FB Neck ROM: Full    Dental no notable dental hx. (+) Missing, Dental Advisory Given,    Pulmonary Current Smoker and Patient abstained from smoking.,    Pulmonary exam normal breath sounds clear to auscultation       Cardiovascular Exercise Tolerance: Good hypertension, Pt. on medications Normal cardiovascular exam Rhythm:Regular Rate:Normal     Neuro/Psych  Neuromuscular disease    GI/Hepatic GERD  ,  Endo/Other    Renal/GU Renal diseaseLab Results      Component                Value               Date                      CREATININE               1.35 (H)            12/07/2020                BUN                      14                  12/07/2020                NA                       139                 12/07/2020                K                        4.1                 12/07/2020                CL                       103                 12/07/2020                CO2                      23                  12/07/2020                Musculoskeletal  (+) Arthritis ,   Abdominal   Peds  Hematology  (+) anemia , Lab Results      Component                Value               Date                      WBC  6.6                 12/07/2020                HGB                      9.7 (L)             12/07/2020                HCT                      30.4 (L)            12/07/2020                MCV                      85.4                12/07/2020                PLT                      127 (L)             12/07/2020              Anesthesia Other Findings   Reproductive/Obstetrics                            Anesthesia Physical Anesthesia  Plan  ASA: III  Anesthesia Plan: General   Post-op Pain Management:    Induction: Intravenous  PONV Risk Score and Plan: 2 and Treatment may vary due to age or medical condition, Ondansetron and Midazolam  Airway Management Planned: Oral ETT  Additional Equipment: None  Intra-op Plan:   Post-operative Plan: Extubation in OR  Informed Consent: I have reviewed the patients History and Physical, chart, labs and discussed the procedure including the risks, benefits and alternatives for the proposed anesthesia with the patient or authorized representative who has indicated his/her understanding and acceptance.     Dental advisory given  Plan Discussed with: CRNA and Anesthesiologist  Anesthesia Plan Comments: (GA w lidocaine infusion)       Anesthesia Quick Evaluation

## 2020-12-13 NOTE — Op Note (Addendum)
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 Left robotic-assisted laparoscopic nephroureterectomy 2. Postoperative intravesical instillation of gemcitabine  Surgeon: [name]. M.D.   Assistant(s): Debbrah Alar, PA-C  An assistant was required for this surgical procedure.  The duties of the assistant included but were not limited to suctioning, passing suture, camera manipulation, retraction. This procedure would not be able to be performed without an Environmental consultant.  Resident: Dr. Carmie Kanner  Anesthesia: General   Complications: None  EBL: 100  IVF: 1600 mL crystalloid   Specimens:  1. Left kidney, ureter and bladder cuff  Disposition of specimens: Pathology   Drains:  1. # 15 Blake pelvic drain 2. 18 Fr Foley catheter  Indication:   Robert Burnett is a 60 y.o. 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 After a thorough review of the management options, he elected to proceed with surgical treatment and the above procedure. We have discussed the potential benefits and risks of the procedure, side effects of the proposed treatment, the likelihood of the patient achieving the goals of the procedure, and any potential problems that might occur during the procedure or recuperation. Informed consent has been obtained.   Description of procedure:   The patient was taken to the operating room and a general anesthetic was administered. The patient was given preoperative antibiotics, placed in the left modified flank position with care to pad all potential pressure points, and prepped and draped in the usual sterile fashion. Next a preoperative timeout was performed.   A site was selected on the left side of the umbilicus for placement of the camera port. This was placed using a standard open Hassan technique which allowed entry into the peritoneal  cavity under direct vision and without difficulty. A 12 mm port was placed in the upper midline. The camera was then used to inspect the abdomen and there was no evidence of any intra-abdominal injuries or other abnormalities. The remaining abdominal ports were then placed. 8 mm robotic ports were placed in the left upper quadrant, left lower quadrant, and far left lateral abdominal wall.  All ports were placed under direct vision without difficulty.   Utilizing the cautery scissors, the white line of Toldt was incised allowing the colon to be mobilized medially and the plane between the mesocolon and the anterior layer of Gerota's fascia to be developed and the kidney to be exposed. The ureter and gonadal vein were identified inferiorly and the ureter was lifted anteriorly off the psoas muscle. Dissection proceeded superiorly along the gonadal vein until it entered the inferior vena cava. The gonadal vein was divided after ligation with multiple Weck clips. The renal hilum was then encountered. There was a single renal artery and renal vein.  Gerota's fascia was then intentionally entered superiorly to spare the adrenal gland and the hepatorenal ligaments were divided. The lateral attachments to the kidney were then divided allowing the kidney to be freely mobile. Dissection then proceeded inferiorly along the ureter toward the pelvis. The gonadal vein was ligated with Weck clips distally and divided. The ureter was dissected free down the the common iliac vessels.   At this point attention returned to the renal fossa. Hemostasis was ensured.  Attention then focused on the pelvic dissection. The robotic cart was undocked. An additional 8 mm robotic port was placed in the lower right abdomen and the cart was rotated and docked  from an inferior direction. The ureter was further dissected freely into the pelvis after the overlying peritoneum was incised and the small vessels feeding the ureter was ligated with  bipolar energy. A Weck clip was placed on the ureter distally to avoid any risk of tumor spillage. The detrusor muscle fibers were divided and the mucosa could be visualized. A 3-0 V-lock suture was secured at the lateral side of the margin of resection in the bladder. The mucosa was then entered and the ureter and a surrounding bladder cuff were removed. The cystotomy was then closed with the v-lock suture in a running fashion with a second imbricating layer as well. The bladder was then filled with saline and there was no evidence for urine leak.   A # 15 Blake drain was then brought through the lateral lower port site and positioned in the perivesical space. The specimen was placed in an Endocatch II bag and later removed through a lower midline incision extending from the robotic port site in that area. The 12 mm upper midline incision was closed with a 0-vicryl suture laparoscopically and the camera port site was closed with a figure of eight 0- vicryl suture. The lower midline extraction site was closed with two running # 1 PDS sutures. All other laparoscopic/robotic ports were removed under direct vision and the pneumoperitoneum let down with inspection of the operative field performed and hemostasis again confirmed. All incision sites were then injected with local anesthetic and reapproximated at the skin level with 4-0 monocryl subcuticular closures. Dermabond was applied to the skin. The patient tolerated the procedure well and without complications. The patient was able to be extubated and transferred to the recovery unit in satisfactory condition.   In the PACU, I instilled 2000 mg of gemcitabine chemotherapy intravesically.  This was left indwelling with the catheter plugged for one hour.  [name] MD

## 2020-12-13 NOTE — Progress Notes (Signed)
Patient ID: Robert Burnett, male   DOB: 11-Jun-1961, 60 y.o.   MRN: [id number]  Post-op note  Subjective: The patient is doing well.  Complains of incisional pain.  Fentanyl patch not yet replaced.  Objective: Vital signs in last 24 hours: Temp:  [97.6 F (36.4 C)-98 F (36.7 C)] 97.6 F (36.4 C) (02/24 1507) Pulse Rate:  [66-82] 76 (02/24 1700) Resp:  [12-20] 17 (02/24 1700) BP: (136-168)/(79-97) 149/79 (02/24 1700) SpO2:  [89 %-100 %] 100 % (02/24 1700) Weight:  [95.1 kg] 95.1 kg (02/24 0945)  Intake/Output from previous day: No intake/output data recorded. Intake/Output this shift: Total I/O In: 2300 [I.V.:2300] Out: 560 [Urine:510; Blood:50]  Physical Exam:  General: Alert and oriented. Abdomen: Soft, Nondistended. Incisions: Clean and dry. GU: Urine clear  Lab Results: Recent Labs    12/13/20 1547  HGB 9.2*  HCT 29.4*   Intravesical gemcitabine administered in PACU  Assessment/Plan: POD#0   1) Continue to monitor, ambulate, IS, pain control   Robert Burnett. MD   LOS: 0 days   Robert Burnett 12/13/2020, 5:45 PM

## 2020-12-13 NOTE — Anesthesia Procedure Notes (Signed)
Procedure Name: Intubation Date/Time: 12/13/2020 11:29 AM Performed by: Lavina Hamman, CRNA Pre-anesthesia Checklist: Patient identified, Emergency Drugs available, Suction available, Patient being monitored and Timeout performed Patient Re-evaluated:Patient Re-evaluated prior to induction Oxygen Delivery Method: Circle system utilized Preoxygenation: Pre-oxygenation with 100% oxygen Induction Type: IV induction Ventilation: Mask ventilation without difficulty Laryngoscope Size: Mac and 4 Grade View: Grade I Tube type: Oral Tube size: 7.5 mm Number of attempts: 1 Airway Equipment and Method: Stylet Placement Confirmation: ETT inserted through vocal cords under direct vision,  positive ETCO2,  CO2 detector and breath sounds checked- equal and bilateral Secured at: 23 cm Tube secured with: Tape Dental Injury: Teeth and Oropharynx as per pre-operative assessment  Comments: ATOI

## 2020-12-13 NOTE — Discharge Instructions (Signed)

## 2020-12-14 ENCOUNTER — Encounter (HOSPITAL_COMMUNITY): Payer: Self-pay | Admitting: Urology

## 2020-12-14 LAB — BASIC METABOLIC PANEL
Anion gap: 12 (ref 5–15)
BUN: 24 mg/dL — ABNORMAL HIGH (ref 6–20)
CO2: 21 mmol/L — ABNORMAL LOW (ref 22–32)
Calcium: 8.1 mg/dL — ABNORMAL LOW (ref 8.9–10.3)
Chloride: 101 mmol/L (ref 98–111)
Creatinine, Ser: 1.42 mg/dL — ABNORMAL HIGH (ref 0.61–1.24)
GFR, Estimated: 57 mL/min — ABNORMAL LOW (ref >60)
Glucose, Bld: 207 mg/dL — ABNORMAL HIGH (ref 70–99)
Potassium: 4.9 mmol/L (ref 3.5–5.1)
Sodium: 134 mmol/L — ABNORMAL LOW (ref 135–145)

## 2020-12-14 LAB — HEMOGLOBIN AND HEMATOCRIT, BLOOD
HCT: 27.6 % — ABNORMAL LOW (ref 39.0–52.0)
Hemoglobin: 8.7 g/dL — ABNORMAL LOW (ref 13.0–17.0)

## 2020-12-14 LAB — CREATININE, FLUID (PLEURAL, PERITONEAL, JP DRAINAGE): Creat, Fluid: 1.4 mg/dL

## 2020-12-14 MED ORDER — OXYCODONE HCL 5 MG PO TABS
10.0000 mg | ORAL_TABLET | ORAL | Status: DC | PRN
  Administered 2020-12-14 (×3): 10 mg via ORAL
  Filled 2020-12-14 (×3): qty 2

## 2020-12-14 MED ORDER — HYDROMORPHONE HCL 1 MG/ML IJ SOLN
1.0000 mg | Freq: Once | INTRAMUSCULAR | Status: AC
  Administered 2020-12-14: 1 mg via INTRAVENOUS
  Filled 2020-12-14: qty 1

## 2020-12-14 MED ORDER — BISACODYL 10 MG RE SUPP
10.0000 mg | Freq: Once | RECTAL | Status: AC
  Administered 2020-12-14: 10 mg via RECTAL
  Filled 2020-12-14: qty 1

## 2020-12-14 MED ORDER — GABAPENTIN 300 MG PO CAPS
300.0000 mg | ORAL_CAPSULE | Freq: Three times a day (TID) | ORAL | Status: DC
  Administered 2020-12-14 – 2020-12-16 (×6): 300 mg via ORAL
  Filled 2020-12-14 (×7): qty 1

## 2020-12-14 MED ORDER — OXYCODONE HCL 5 MG PO TABS
10.0000 mg | ORAL_TABLET | ORAL | Status: DC | PRN
  Administered 2020-12-14 – 2020-12-16 (×13): 10 mg via ORAL
  Filled 2020-12-14 (×13): qty 2

## 2020-12-14 NOTE — Plan of Care (Signed)
  Problem: Health Behavior/Discharge Planning: Goal: Ability to manage health-related needs will improve Outcome: Progressing   

## 2020-12-14 NOTE — Progress Notes (Signed)
Patient ID: Robert Burnett, male   DOB: 03-12-1961, 60 y.o.   MRN: [id number]  Pt doing better but still with significant pain.  Now on Fentanyl patch and oxycodone 10 mg q 4 hrs per his usual chronic regimen.  Still needed IV hydromorphone for breakthrough but was able to ambulate well.  Tolerating diet but no flatus.  Abd: Soft and ND  Drain Cr 1.4  Plan: - D/C drain and SL IVF - Main issue continues to be pain control and likely related to his chronic narcotic use.  Will increase frequency of oral oxycodone to every 3 hrs and start gabapentin orally. - Plan for d/c home hopefully tomorrow if pain is tolerable - Pathology pending.  Has f/u next Tuesday for cystogram.

## 2020-12-14 NOTE — Plan of Care (Signed)

## 2020-12-14 NOTE — Progress Notes (Signed)
Patient ID: Robert Burnett, male   DOB: [birth year], 60 y.o.   MRN: [id number]  1 Day Post-Op Subjective: Overall, he has done well overnight.  Main complaint is pain control.  He does have his fentanyl patch on and has been getting hydromorphone every 2 hrs.  He did ambulate twice despite pain.  He is tolerating liquids.  No nausea or vomiting.  No flatus yet.  Objective: Vital signs in last 24 hours: Temp:  [97.6 F (36.4 C)-98.2 F (36.8 C)] 97.8 F (36.6 C) (02/25 0600) Pulse Rate:  [60-82] 60 (02/25 0600) Resp:  [12-20] 16 (02/25 0600) BP: (112-168)/(69-97) 112/69 (02/25 0600) SpO2:  [89 %-100 %] 97 % (02/25 0600) Weight:  [95.1 kg] 95.1 kg (02/24 0945)  Intake/Output from previous day: 02/24 0701 - 02/25 0700 In: 3939.8 [P.O.:360; I.V.:3229.8; IV Piggyback:350] Out: 7106 [Urine:1435; Drains:80; Blood:50] Intake/Output this shift: No intake/output data recorded.  Physical Exam:  General: Alert and oriented Abdomen: Soft, ND, positive BS, drain with minimal output Incisions: C/D/I Ext: NT, No erythema  Lab Results: Recent Labs    12/13/20 1547 12/14/20 0403  HGB 9.2* 8.7*  HCT 29.4* 27.6*   BMET Recent Labs    12/13/20 1547 12/14/20 0403  NA 136 134*  K 4.4 4.9  CL 102 101  CO2 22 21*  GLUCOSE 258* 207*  BUN 21* 24*  CREATININE 1.49* 1.42*  CALCIUM 8.5* 8.1*     Studies/Results: Pathology pending  Assessment/Plan: POD # 1 s/p left RAL nephroureterectomy - Ambulate, IS - Restart oral pain control regimen (pt on fentanyl patch and 10 mg oxycodone q 4 hr prn at baseline)  - Advance diet - Check drain Cr - Dulcolax suppository - Catheter teaching - Renal function and Hgb stable - Will reassess later today for possible discharge. Anticipate that pain control will be major limiting factor.   LOS: 1 day   Robert Burnett 12/14/2020, 7:31 AM

## 2020-12-15 LAB — BASIC METABOLIC PANEL
Anion gap: 11 (ref 5–15)
BUN: 23 mg/dL — ABNORMAL HIGH (ref 6–20)
CO2: 25 mmol/L (ref 22–32)
Calcium: 8.4 mg/dL — ABNORMAL LOW (ref 8.9–10.3)
Chloride: 101 mmol/L (ref 98–111)
Creatinine, Ser: 1.24 mg/dL (ref 0.61–1.24)
GFR, Estimated: >60 mL/min (ref >60)
Glucose, Bld: 139 mg/dL — ABNORMAL HIGH (ref 70–99)
Potassium: 4.8 mmol/L (ref 3.5–5.1)
Sodium: 137 mmol/L (ref 135–145)

## 2020-12-15 LAB — HEMOGLOBIN AND HEMATOCRIT, BLOOD
HCT: 28.5 % — ABNORMAL LOW (ref 39.0–52.0)
Hemoglobin: 8.8 g/dL — ABNORMAL LOW (ref 13.0–17.0)

## 2020-12-15 NOTE — Plan of Care (Signed)
  Problem: Education: Goal: Knowledge of General Education information will improve Description: Including pain rating scale, medication(s)/side effects and non-pharmacologic comfort measures Outcome: Progressing   Problem: Elimination: Goal: Will not experience complications related to bowel motility Outcome: Progressing   Problem: Pain Managment: Goal: General experience of comfort will improve Outcome: Progressing   

## 2020-12-15 NOTE — Progress Notes (Signed)
S: He's getting close but not ready to go. Was able to get into chair this AM and lift his head off the pillow although he also reports walking the halls several times. Pain slowly improving. Discussed how neck, back pain affects his whole chest and body as well as surgical recovery. +flatus.  O:  . Vitals:   12/15/20 0653 12/15/20 1053  BP: 136/76 135/77  Pulse: (!) 56 64  Resp: 18 16  Temp: 98.5 F (36.9 C) 98.3 F (36.8 C)  SpO2: 98% 98%   .  Intake/Output Summary (Last 24 hours) at 12/15/2020 1722 Last data filed at 12/15/2020 1100 Gross per 24 hour  Intake --  Output 1250 ml  Net -1250 ml   NAD Sitting in chair CV - RRR Abd - soft, mild TTP, inc c/d/i Ext - no calf pain or swelling Foley - in place - urine clear   POD#2 Procedure:  1. Left robotic-assisted laparoscopic nephroureterectomy Postoperative intravesical instillation of gemcitabine  -pain control better today -continue to ambulate -Hgb stable today and cr improved  -anticipate d/c in AM

## 2020-12-16 ENCOUNTER — Other Ambulatory Visit (HOSPITAL_COMMUNITY): Payer: Self-pay | Admitting: Urology

## 2020-12-16 MED ORDER — ONDANSETRON HCL 8 MG PO TABS: 8.0000 mg | ORAL_TABLET | Freq: Three times a day (TID) | ORAL | 0 refills | Status: DC | PRN

## 2020-12-16 NOTE — Discharge Summary (Signed)
Physician Discharge Summary  Patient ID: Robert Burnett MRN: [id number] DOB/AGE: July 03, 1961 60 y.o.  Admit date: 12/13/2020 Discharge date: 12/16/2020  Admission Diagnoses: Ureteral cancer, left  Discharge Diagnoses:  Active Problems:   Ureteral cancer, left [facility])   Discharged Condition: good  Hospital Course: Admitted following Left robotic-assisted laparoscopic nephroureterectomy. By POD#3 he was ambulating on his own, tolerating a regular diet and had adequate pain control. JP removed POD#1. Home with foley catheter. Pt said he gets waves of pain from his chronic conditions and now with the surgery which can bring on some nausea and requested some nausea medicine for home. Again, he is eating well and passing flatus.   Consults: none  Significant Diagnostic Studies: none  Treatments: surgery: Left robotic-assisted laparoscopic nephroureterectomy   Discharge Exam: Blood pressure 134/87, pulse 64, temperature 98.6 F (37 C), temperature source Oral, resp. rate 20, height 6\' 1"  (1.854 m), weight 95.1 kg, SpO2 99 %. Sitting in chair eating lunch CV - RRR Lungs - reg effort and depth Abd - soft, NT, inc c/d/i Ext - no calf swelling Foley in place - urine clear yellow    Disposition: Discharge disposition: 01-Home or Self Care       Discharge Instructions    Discharge patient   Complete by: As directed    Discharge disposition: 01-Home or Self Care   Discharge patient date: 12/16/2020     Allergies as of 12/16/2020      Reactions   Prozac [fluoxetine Hcl]    Headaches    Amlodipine Rash      Medication List    TAKE these medications   atenolol 100 MG tablet Commonly known as: TENORMIN Take 0.5 tablets (50 mg total) by mouth daily. What changed: how much to take   atorvastatin 40 MG tablet Commonly known as: LIPITOR Take 40 mg by mouth at bedtime.   fentaNYL 75 MCG/HR Commonly known as: [place] 1 patch onto the skin every 3 (three) days.    finasteride 5 MG tablet Commonly known as: PROSCAR Take 5 mg by mouth daily.   losartan-hydrochlorothiazide 50-12.5 MG tablet Commonly known as: HYZAAR Take 1 tablet by mouth daily.   ondansetron 8 MG tablet Commonly known as: ZOFRAN Take 1 tablet (8 mg total) by mouth every 8 (eight) hours as needed for nausea or vomiting.   Oxycodone HCl 10 MG Tabs Take 1 tablet (10 mg total) by mouth every 4 (four) hours as needed (pain).   pantoprazole 40 MG tablet Commonly known as: PROTONIX Take 1 tablet (40 mg total) by mouth daily. What changed:   when to take this  reasons to take this       Follow-up Information    Raynelle Bring, MD On 12/18/2020.   Specialty: Urology Why: at 1:15 Contact information: [place] [address] [phone]               Signed: Festus Aloe 12/16/2020, 2:32 PM

## 2020-12-17 LAB — SURGICAL PATHOLOGY

## 2020-12-17 MED FILL — ONDANSETRON HCL 8 MG TABLET: 8 | 3 days supply | Qty: 9 | Fill #0

## 2021-05-21 ENCOUNTER — Other Ambulatory Visit: Payer: Self-pay

## 2021-05-21 ENCOUNTER — Ambulatory Visit (INDEPENDENT_AMBULATORY_CARE_PROVIDER_SITE_OTHER): Payer: 59

## 2021-05-21 ENCOUNTER — Ambulatory Visit (INDEPENDENT_AMBULATORY_CARE_PROVIDER_SITE_OTHER): Payer: 59 | Admitting: Podiatry

## 2021-05-21 ENCOUNTER — Encounter: Payer: Self-pay | Admitting: Podiatry

## 2021-05-21 DIAGNOSIS — E08621 Diabetes mellitus due to underlying condition with foot ulcer: Secondary | ICD-10-CM | POA: Diagnosis not present

## 2021-05-21 DIAGNOSIS — L97529 Non-pressure chronic ulcer of other part of left foot with unspecified severity: Secondary | ICD-10-CM

## 2021-05-21 DIAGNOSIS — L97522 Non-pressure chronic ulcer of other part of left foot with fat layer exposed: Secondary | ICD-10-CM

## 2021-05-21 MED ORDER — SILVER SULFADIAZINE 1 % EX CREA: 1.0000 "application " | TOPICAL_CREAM | Freq: Every day | CUTANEOUS | 0 refills | Status: DC

## 2021-05-21 MED ORDER — DOXYCYCLINE HYCLATE 100 MG PO TABS: 100.0000 mg | ORAL_TABLET | Freq: Two times a day (BID) | ORAL | 0 refills | Status: DC

## 2021-05-21 NOTE — Patient Instructions (Signed)
You can was the foot with soap and water daily. Dry very well. Apply a small amount of silvadene ointment and a bandage daily. Start the doxycycline I sent to the pharmacy. This is the antibiotic. Monitor for any signs/symptoms of infection. Call the office immediately if any occur or go directly to the emergency room. Call with any questions/concerns.

## 2021-05-27 NOTE — Progress Notes (Signed)
Subjective:   Patient ID: Robert Burnett, male   DOB: 60 y.o.   MRN: [id number]   HPI 60 year old male presents the office today for concerns of a "cyst" on the bottom of his left foot.  This is been ongoing for quite some time he states that recently drained and opened up.  He noticed a pocket of pus when it drained.  He said no recent treatment for this.  He has a history of left second toe amputation for my Dr. Sharol Given.  Denies any fevers or chills.  No other concerns.   Review of Systems  All other systems reviewed and are negative.  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([place])     Past Surgical History:  Procedure Laterality Date   CYSTOSCOPY/RETROGRADE/URETEROSCOPY Left 10/29/2020   Procedure: CYSTOSCOPY/RETROGRADE/ LEFT URETEROSCOPY WITH BIOPSY, LEFT URETERAL STENT;  Surgeon: Raynelle Bring, MD;  Location: [facility];  Service: Urology;  Laterality: Left;   KNEE SURGERY  1975 and 1989   MULTIPLE TOOTH EXTRACTIONS     ROBOT ASSITED LAPAROSCOPIC NEPHROURETERECTOMY Left 12/13/2020   Procedure: XI ROBOT ASSITED LAPAROSCOPIC NEPHROURETERECTOMY , POST OPERATIVE INTRAVESICAL GEMCITABINE;  Surgeon: Raynelle Bring, MD;  Location: [facility];  Service: Urology;  Laterality: Left;  ONLY NEEDS 240 MIN   TOEM AMPUTATION      2012   WISDOM TOOTH EXTRACTION       Current Outpatient Medications:    doxycycline (VIBRA-TABS) 100 MG tablet, Take 1 tablet (100 mg total) by mouth 2 (two) times daily., Disp: 20 tablet, Rfl: 0   silver sulfADIAZINE (SILVADENE) 1 % cream, Apply 1 application topically daily., Disp: 50 g, Rfl: 0   atenolol (TENORMIN) 100 MG tablet, Take 0.5 tablets (50 mg total) by  mouth daily. (Patient taking differently: Take 100 mg by mouth daily.), Disp: 90 tablet, Rfl: 1   atorvastatin (LIPITOR) 40 MG tablet, Take 40 mg by mouth at bedtime., Disp: , Rfl:    atorvastatin (LIPITOR) 80 MG tablet, Take 80 mg by mouth daily., Disp: , Rfl:    cephALEXin (KEFLEX) 500 MG capsule, Take 500 mg by mouth 4 (four) times daily., Disp: , Rfl:    fentaNYL (DURAGESIC) 75 MCG/HR, Place 1 patch onto the skin every 3 (three) days., Disp: 10 patch, Rfl: 0   finasteride (PROSCAR) 5 MG tablet, Take 5 mg by mouth daily., Disp: , Rfl:    losartan-hydrochlorothiazide (HYZAAR) 50-12.5 MG tablet, Take 1 tablet by mouth daily., Disp: , Rfl:    metFORMIN (GLUCOPHAGE-XR) 500 MG 24 hr tablet, Take 1,000 mg by mouth 2 (two) times daily., Disp: , Rfl:    ondansetron (ZOFRAN) 8 MG tablet, TAKE 1 TABLET BY MOUTH EVERY 8 HOURS AS NEEDED FOR NAUSEA OR VOMITING, Disp: 9 tablet, Rfl: 0   Oxycodone HCl 10 MG TABS, TAKE 1 TABLET BY MOUTH EVERY 4 HOURS AS NEEDED FOR PAIN, Disp: 10 tablet, Rfl: 0   pantoprazole (PROTONIX) 40 MG tablet, Take 1 tablet (40 mg total) by mouth daily. (Patient taking differently: Take  40 mg by mouth daily as needed (acid reflux/indigestion).), Disp: 30 tablet, Rfl: 3   sulfamethoxazole-trimethoprim (BACTRIM DS) 800-160 MG tablet, Take 2 tablets by mouth 2 (two) times daily., Disp: , Rfl:   Allergies  Allergen Reactions   Prozac [Fluoxetine Hcl]     Headaches    Amlodipine Rash          Objective:  Physical Exam  General: AAO x3, NAD  Dermatological: The plantar aspect left foot is a thick hyperkeratotic lesion with central granular wound present.  After debridement of the wound there is no probing to bone, undermining or tunneling.  After debridement of the wound it measured 1.5 x 1.3 x 0.2 cm.  Prior to debridement the wound was smaller at 0.8 x 0.5 x 0.1 cm but had hyperkeratotic tissue overlying the area.  There is no surrounding erythema, ascending cellulitis.  There is no  fluctuation of tissue there is no malodor.    Vascular: Dorsalis Pedis artery and Posterior Tibial artery pedal pulses are 2/4 bilateral with immedate capillary fill time. There is no pain with calf compression, swelling, warmth, erythema.   Neruologic: Sensation decreased with Semmes Weinstein monofilament  Musculoskeletal: Previous second toe amputation.  Muscular strength 5/5 in all groups tested bilateral.  Gait: Unassisted, Nonantalgic.       Assessment:   Ulceration left foot     Plan:  -Treatment options discussed including all alternatives, risks, and complications -Etiology of symptoms were discussed -X-rays were obtained and reviewed with the patient.  No evidence of acute fracture, osteomyelitis or soft tissue emphysema. -Today utilizing 312 with scalpel I was able debride nonviable devitalized tissue down to healthy, granular, bleeding wound base.  This is to remove nonviable devitalized tissue to help promote wound healing.  Minimal blood loss and was controlled with direct pressure.  Pre and post wound measurements as described above.  Prescribed Silvadene.  Also prescribed Bactrim.  Offloading.  Surgical shoe.  Monitor closely for signs or symptoms of worsening infection report to the emergency room should any occur. -Dressings ordered through [facility]   Return in about 2 weeks (around 06/04/2021) for ulcer.  Trula Slade DPM

## 2021-05-30 ENCOUNTER — Encounter: Payer: Self-pay | Admitting: Podiatry

## 2021-05-30 NOTE — Progress Notes (Signed)
Refaxed [facility] forms Collagen silver ordered

## 2021-06-04 ENCOUNTER — Encounter: Payer: Self-pay | Admitting: Podiatry

## 2021-06-04 ENCOUNTER — Ambulatory Visit (INDEPENDENT_AMBULATORY_CARE_PROVIDER_SITE_OTHER): Payer: 59 | Admitting: Podiatry

## 2021-06-04 ENCOUNTER — Other Ambulatory Visit: Payer: Self-pay

## 2021-06-04 DIAGNOSIS — E08621 Diabetes mellitus due to underlying condition with foot ulcer: Secondary | ICD-10-CM

## 2021-06-04 DIAGNOSIS — L97522 Non-pressure chronic ulcer of other part of left foot with fat layer exposed: Secondary | ICD-10-CM

## 2021-06-12 NOTE — Progress Notes (Signed)
Subjective: 60 year old male presents the office today for follow-up evaluation of an ulcer to the plantar aspect of his left foot.  He states that is doing better.  Denies any drainage.  Been wearing the offloading shoe which has been helpful.  He has no pain.  No increase in swelling or redness.  No fevers or chills.  No other concerns.  Objective: AAO x3, NAD DP/PT pulses palpable bilaterally, CRT less than 3 seconds On the plantar aspect left foot submetatarsal area is a granular wound with hyperkeratotic periwound.  After debridement of the wound today measures 1 x 1 x 0.1 cm without any surrounding erythema, ascending cellulitis.  There is no fluctuation crepitation there is no malodor.  Prior to debridement the wound was slightly 0.9 x 0.9 centimeters. No pain with calf compression, swelling, warmth, erythema  Assessment: Ulceration left foot  Plan: -All treatment options discussed with the patient including all alternatives, risks, complications.  -Today I utilized [id number] with scalpel to debride nonviable devitalized tissue to promote wound healing without any blood loss.  He tolerated the procedure well. -Continue with offloading surgical shoe as well daily dressing changes.  He did receive the products from [facility].  -Monitor for any clinical signs or symptoms of infection and directed to call the office immediately should any occur or go to the ER. -Patient encouraged to call the office with any questions, concerns, change in symptoms.   Trula Slade DPM

## 2021-06-27 ENCOUNTER — Ambulatory Visit (INDEPENDENT_AMBULATORY_CARE_PROVIDER_SITE_OTHER): Payer: 59 | Admitting: Podiatry

## 2021-06-27 ENCOUNTER — Other Ambulatory Visit: Payer: Self-pay

## 2021-06-27 DIAGNOSIS — L97522 Non-pressure chronic ulcer of other part of left foot with fat layer exposed: Secondary | ICD-10-CM

## 2021-06-27 DIAGNOSIS — E08621 Diabetes mellitus due to underlying condition with foot ulcer: Secondary | ICD-10-CM

## 2021-07-03 NOTE — Progress Notes (Signed)
Subjective: 60 year old male presents the office today for follow-up evaluation of an ulcer to the plantar aspect of his left foot.  He states the wound is doing better.  Minimal bloody drainage and there is no purulence.  He is remained in surgical shoe.  No swelling or redness.  He has no fevers or chills.  No other concerns today.   Objective: AAO x3, NAD DP/PT pulses palpable bilaterally, CRT less than 3 seconds On the plantar aspect left foot submetatarsal area is a granular wound with hyperkeratotic periwound.  After debridement of the wound today measures 0.7 x 0.7 x 0.1 cm without any surrounding erythema, ascending cellulitis.  There is no fluctuation crepitation there is no malodor.  Previous wound measurements are about the same. No pain with calf compression, swelling, warmth, erythema  Assessment: Ulceration left foot  Plan: -All treatment options discussed with the patient including all alternatives, risks, complications.  -Today I utilized [id number] with scalpel to debride nonviable devitalized tissue to promote wound healing without any blood loss.  He tolerated the procedure well. -Continue with offloading surgical shoe as well daily dressing changes.  He did receive the products from [facility].  -Monitor for any clinical signs or symptoms of infection and directed to call the office immediately should any occur or go to the ER. -Patient encouraged to call the office with any questions, concerns, change in symptoms.   Trula Slade DPM

## 2021-07-18 ENCOUNTER — Other Ambulatory Visit: Payer: Self-pay

## 2021-07-18 ENCOUNTER — Ambulatory Visit (INDEPENDENT_AMBULATORY_CARE_PROVIDER_SITE_OTHER): Payer: 59 | Admitting: Podiatry

## 2021-07-18 DIAGNOSIS — L97522 Non-pressure chronic ulcer of other part of left foot with fat layer exposed: Secondary | ICD-10-CM

## 2021-07-18 DIAGNOSIS — E08621 Diabetes mellitus due to underlying condition with foot ulcer: Secondary | ICD-10-CM | POA: Diagnosis not present

## 2021-07-18 DIAGNOSIS — M79675 Pain in left toe(s): Secondary | ICD-10-CM

## 2021-07-18 DIAGNOSIS — B351 Tinea unguium: Secondary | ICD-10-CM | POA: Diagnosis not present

## 2021-07-18 DIAGNOSIS — M79674 Pain in right toe(s): Secondary | ICD-10-CM

## 2021-07-19 NOTE — Progress Notes (Signed)
Subjective: 60 year old male presents the office today for follow-up evaluation of an ulcer to the plantar aspect of his left foot.  He states that incident not bothersome recently thinks the callus got thicker.  He has not seen any swelling or redness or any drainage.  He has been changing the bandage with Silvadene.   Asking for his nails be trimmed today as they are thick and elongated he cannot do them himself given back issues.   No fevers or chills.  No other concerns.  Objective: AAO x3, NAD DP/PT pulses palpable bilaterally, CRT less than 3 seconds On the plantar aspect left foot submetatarsal area is a granular wound with hyperkeratotic periwound.  After debridement of the wound today measures the same size at 0.7 x 0.7 x 0.1 cm without any surrounding erythema, ascending cellulitis.  Prior to debridement was smaller at 0.5 x 0.5 x 0.1 cm.  There is no fluctuation crepitation there is no malodor.  Previous wound measurements are about the same. Nails appear to be hypertrophic, dystrophic with yellow-brown discoloration and tenderness to the nails x 9.  History of amputation of the left foot and the remaining of the nails are tender. No pain with calf compression, swelling, warmth, erythema  Assessment: Ulceration left foot; symptomatic onychomycosis  Plan: -All treatment options discussed with the patient including all alternatives, risks, complications.  -Today I utilized #756 with scalpel to debride nonviable devitalized tissue to promote wound healing without any blood loss.  He tolerated the procedure well.  Continue surgical shoe, offloading.  I would like for him to switch to using Prism which we applied today and I also sent him home with the remainder of it. -Sharply debrided the nails x9 without any complications or bleeding. -Monitor for any clinical signs or symptoms of infection and directed to call the office immediately should any occur or go to the ER. -Patient  encouraged to call the office with any questions, concerns, change in symptoms.   Return in about 2 weeks (around 08/01/2021) for ulcer left foot.  Trula Slade DPM

## 2021-07-31 NOTE — Progress Notes (Signed)
   I, Robert Burnett, LAT, ATC, am serving as scribe for Dr. Lynne Leader.  Subjective:    CC: L elbow pain  HPI: Pt is a 60 y/o RHD male c/o L lateral elbow pain x several weeks w/ no known MOI. Pt locates pain to his L lateral elbow.  He denies any known injury.  He has not tried much treatment yet for this.  Swelling: yes UE numbness/tingling:No Aggravates: manual pressure to the area; laying on his L side w/ his L arm extended; picking up a glass of water Treatments tried: Voltaren gel;   Pertinent review of Systems: No fevers or chills  Relevant historical information: Thoracic DDD.  Diabetes.   Objective:    Vitals:   08/01/21 1253  BP: (!) 158/98  Pulse: 71  SpO2: 94%   General: Well Developed, well nourished, and in no acute distress.   MSK: Left elbow: Normal-appearing Normal motion. Intact strength Tender palpation lateral epicondyle. Pain with resisted wrist extension.  Lab and Radiology Results  Diagnostic Limited MSK Ultrasound of: Left lateral epicondyle Common extensor tendon origin with small bone avulsion at superficial tip consistent with lateral epicondylitis. Increased Doppler activity present within common extensor tendon again consistent with lateral epicondylitis.  No definitive tear present. Bony structures otherwise normal-appearing Impression: Lateral epicondylitis     Impression and Recommendations:    Assessment and Plan: 60 y.o. male with left lateral elbow pain primarily due to lateral epicondylitis.  Plan to treat with compression sleeve, nitroglycerin patch protocol, and home exercise program taught in clinic today by ATC.  Recheck in clinic in 1 month.  If not improving likely will proceed with steroid injection.  Would like to avoid injection if possible given his diabetes.  Can return to clinic sooner if needed.   PDMP not reviewed this encounter. Orders Placed This Encounter  Procedures   [place] LIMITED JOINT SPACE STRUCTURES UP  LEFT(NO LINKED CHARGES)    Order Specific Question:   Reason for Exam (SYMPTOM  OR DIAGNOSIS REQUIRED)    Answer:   L elbow pain    Order Specific Question:   Preferred imaging location?    Answer:   [facility]   Meds ordered this encounter  Medications   nitroGLYCERIN (NITRODUR - DOSED IN MG/24 HR) 0.2 mg/hr patch    Sig: Place 1/4 to 1/2 of a patch over affected region. Remove and replace once daily.  Slightly alter skin placement daily    Dispense:  30 patch    Refill:  1    For musculoskeletal purposes.  Okay to cut patch.    Discussed warning signs or symptoms. Please see discharge instructions. Patient expresses understanding.   The above documentation has been reviewed and is accurate and complete Lynne Leader, M.D.

## 2021-08-01 ENCOUNTER — Ambulatory Visit (INDEPENDENT_AMBULATORY_CARE_PROVIDER_SITE_OTHER): Payer: 59 | Admitting: Family Medicine

## 2021-08-01 ENCOUNTER — Encounter: Payer: Self-pay | Admitting: Family Medicine

## 2021-08-01 ENCOUNTER — Other Ambulatory Visit: Payer: Self-pay

## 2021-08-01 ENCOUNTER — Ambulatory Visit: Payer: Self-pay

## 2021-08-01 VITALS — BP 158/98 | HR 71 | Ht 73.0 in | Wt 194.8 lb

## 2021-08-01 DIAGNOSIS — M7712 Lateral epicondylitis, left elbow: Secondary | ICD-10-CM | POA: Diagnosis not present

## 2021-08-01 DIAGNOSIS — M25522 Pain in left elbow: Secondary | ICD-10-CM | POA: Diagnosis not present

## 2021-08-01 MED ORDER — NITROGLYCERIN 0.2 MG/HR TD PT24: MEDICATED_PATCH | TRANSDERMAL | 1 refills | Status: DC

## 2021-08-01 NOTE — Patient Instructions (Addendum)
Nice to meet you today.  Please perform the exercise program that we have prepared for you and gone over in detail on a daily basis.  In addition to the handout you were provided you can access your program through: [email or web address]    Your unique program code is:  [email or web address]  Nitroglycerin Protocol  Apply 1/4 nitroglycerin patch to affected area daily. Change position of patch within the affected area every 24 hours. You may experience a headache during the first 1-2 weeks of using the patch, these should subside. If you experience headaches after beginning nitroglycerin patch treatment, you may take your preferred over the counter pain reliever. Another side effect of the nitroglycerin patch is skin irritation or rash related to patch adhesive. Please notify our office if you develop more severe headaches or rash, and stop the patch. Tendon healing with nitroglycerin patch may require 12 to 24 weeks depending on the extent of injury. Men should not use if taking Viagra, Cialis, or Levitra.  Do not use if you have migraines or rosacea.    Follow-up in one month.

## 2021-08-05 ENCOUNTER — Ambulatory Visit: Payer: 59 | Admitting: Podiatry

## 2021-08-16 ENCOUNTER — Other Ambulatory Visit: Payer: Self-pay | Admitting: Podiatry

## 2021-08-16 ENCOUNTER — Telehealth: Payer: Self-pay | Admitting: Podiatry

## 2021-08-16 MED ORDER — DOXYCYCLINE HYCLATE 100 MG PO TABS
100.0000 mg | ORAL_TABLET | Freq: Two times a day (BID) | ORAL | 0 refills | Status: DC
Start: 2021-08-16 — End: 2021-08-29

## 2021-08-16 NOTE — Telephone Encounter (Signed)
Patient called to reschedule appointment he was to have had on 10/17. Said the cyst/wound on his left foot is now leaking and has a strong odor. Doesn't know if he needs an antibiotic called in.

## 2021-08-16 NOTE — Progress Notes (Signed)
Patient called stating that he has been having drainage f(about 1/2 teaspoon he states) from the wound. He had some pain but that is better. No swelling, redness, drainage he states. No fevers or chills. I have sent doxycyline. Recommenced to stay off of the foot and also to change the bandage daily. He has follow up on Monday but if there is any worsening before to call us or go to the ER.   He had to miss his last appointment due to a scheduling conflict he had with another appointment.

## 2021-08-19 ENCOUNTER — Other Ambulatory Visit: Payer: Self-pay

## 2021-08-19 ENCOUNTER — Ambulatory Visit (INDEPENDENT_AMBULATORY_CARE_PROVIDER_SITE_OTHER): Payer: 59 | Admitting: Podiatry

## 2021-08-19 ENCOUNTER — Encounter: Payer: Self-pay | Admitting: Podiatry

## 2021-08-19 ENCOUNTER — Ambulatory Visit (INDEPENDENT_AMBULATORY_CARE_PROVIDER_SITE_OTHER): Payer: 59

## 2021-08-19 VITALS — Temp 97.1°F

## 2021-08-19 DIAGNOSIS — E08621 Diabetes mellitus due to underlying condition with foot ulcer: Secondary | ICD-10-CM

## 2021-08-19 DIAGNOSIS — L97522 Non-pressure chronic ulcer of other part of left foot with fat layer exposed: Secondary | ICD-10-CM | POA: Diagnosis not present

## 2021-08-21 ENCOUNTER — Encounter: Payer: Self-pay | Admitting: Cardiology

## 2021-08-21 ENCOUNTER — Other Ambulatory Visit: Payer: Self-pay | Admitting: Urology

## 2021-08-21 NOTE — Progress Notes (Signed)
Subjective: 60 year old male presents the office today for follow-up evaluation of an ulcer to the plantar aspect of his left foot.  Unfortunate he missed his last appointment due to scheduling conflicts with another issue that he had.  He states that the calluses gotten thick and he tried trimming it himself.  Recently he noticed some drainage coming from the wound and he called the office and he was started on antibiotics.  No increase in swelling or redness to his foot.  He has no fevers or chills and reports.  He has no other concerns.  Objective: AAO x3, NAD DP/PT pulses palpable bilaterally, CRT less than 3 seconds On the plantar aspect the left foot is a thick hyperkeratotic lesion with central granular wound.  Prior to debridement the wound measured 0.7 x 0.6 x 0.2 cm.  After debridement it was a larger at 1.4 x 1.2 x 0.4 cm there is no probing to bone, and or tunneling.  Mild rim of erythema there is no ascending cellulitis.  No fluctuance or crepitation there is no malodor.  No significant drainage expressed and there is no purulence.  No pain with calf compression, swelling, warmth, erythema    Assessment: Ulceration left foot  Plan: -All treatment options discussed with the patient including all alternatives, risks, complications.  -Today I utilized #846 with scalpel to debride nonviable devitalized tissue to promote wound healing without any blood loss.  He tolerated the procedure well.  Continue with daily dressing changes.  Finish course of doxycycline.  Encouraged offloading at all times and elevation.  Monitor closely for any signs symptoms of worsening infection report to emergency room report immediately should any occur.  Trula Slade DPM

## 2021-08-22 NOTE — Progress Notes (Addendum)
COVID swab appointment: n/a  COVID Vaccine Completed: yes x2 Date COVID Vaccine completed: Has received booster: COVID vaccine manufacturer: Pfizer      Date of COVID positive in last 90 days: no  PCP - [name], MD Cardiologist - n/a  Chest x-ray - CT 11/29/20 Epic EKG - 10/25/20 Epic Stress Test - n/a ECHO - n/a Cardiac Cath - n/a Pacemaker/ICD device last checked: n/a Spinal Cord Stimulator: n/a  Sleep Study - n/a CPAP -   Fasting Blood Sugar -  Checks Blood Sugar _just got new meter, has not been checking  Blood Thinner Instructions: Aspirin Instructions: Last Dose:  Activity level:  Can go up a flight of stairs and perform activities of daily living without stopping and without symptoms of chest pain or shortness of breath.   Able to exercise without symptoms  Unable to go up a flight of stairs without symptoms of      Anesthesia review:   Patient denies shortness of breath, fever, cough and chest pain at PAT appointment   Patient verbalized understanding of instructions that were given to them at the PAT appointment. Patient was also instructed that they will need to review over the PAT instructions again at home before surgery.

## 2021-08-22 NOTE — Patient Instructions (Signed)
DUE TO COVID-19 ONLY ONE VISITOR IS ALLOWED TO COME WITH YOU AND STAY IN THE WAITING ROOM ONLY DURING PRE OP AND PROCEDURE.   **NO VISITORS ARE ALLOWED IN THE SHORT STAY AREA OR RECOVERY ROOM!!**       Your procedure is scheduled on: 08/29/21   Report to [facility] Main Entrance    Report to admitting at 12:30 PM   Call this number if you have problems the morning of surgery [phone]   Do not eat food :After Midnight.   May have liquids until 11:45 AM day of surgery  CLEAR LIQUID DIET  Foods Allowed                                                                     Foods Excluded  Water, Black Coffee and tea (no milk or creamer)           liquids that you cannot  Plain Jell-O in any flavor  (No red)                                    see through such as: Fruit ices (not with fruit pulp)                                            milk, soups, orange juice              Iced Popsicles (No red)                                                All solid food                                   Apple juices Sports drinks like Gatorade (No red) Lightly seasoned clear broth or consume(fat free) Sugar   Oral Hygiene is also important to reduce your risk of infection.                                    Remember - BRUSH YOUR TEETH THE MORNING OF SURGERY WITH YOUR REGULAR TOOTHPASTE   Do NOT smoke after Midnight   Take these medicines the morning of surgery with A SIP OF WATER: Atenolol, Lipitor, Proscar, Protonix.   DO NOT TAKE ANY ORAL DIABETIC MEDICATIONS DAY OF YOUR SURGERY  How to Manage Your Diabetes Before and After Surgery  Why is it important to control my blood sugar before and after surgery? Improving blood sugar levels before and after surgery helps healing and can limit problems. A way of improving blood sugar control is eating a healthy diet by:  Eating less sugar and carbohydrates  Increasing activity/exercise  Talking with your doctor about reaching your  blood sugar goals High blood sugars (greater than 180 mg/dL) can raise  your risk of infections and slow your recovery, so you will need to focus on controlling your diabetes during the weeks before surgery. Make sure that the doctor who takes care of your diabetes knows about your planned surgery including the date and location.  How do I manage my blood sugar before surgery? Check your blood sugar at least 4 times a day, starting 2 days before surgery, to make sure that the level is not too high or low. Check your blood sugar the morning of your surgery when you wake up and every 2 hours until you get to the Short Stay unit. If your blood sugar is less than 70 mg/dL, you will need to treat for low blood sugar: Do not take insulin. Treat a low blood sugar (less than 70 mg/dL) with  cup of clear juice (cranberry or apple), 4 glucose tablets, OR glucose gel. Recheck blood sugar in 15 minutes after treatment (to make sure it is greater than 70 mg/dL). If your blood sugar is not greater than 70 mg/dL on recheck, call [phone] for further instructions. Report your blood sugar to the short stay nurse when you get to Short Stay.  If you are admitted to the hospital after surgery: Your blood sugar will be checked by the staff and you will probably be given insulin after surgery (instead of oral diabetes medicines) to make sure you have good blood sugar levels. The goal for blood sugar control after surgery is 80-180 mg/dL.   WHAT DO I DO ABOUT MY DIABETES MEDICATION?  Do not take oral diabetes medicines (pills) the morning of surgery.  THE DAY BEFORE SURGERY, take Metformin as prescribed.       THE MORNING OF SURGERY, do not take Metformin.   Reviewed and Endorsed by [facility] Patient Education Committee, August 2015                               You may not have any metal on your body including jewelry, and body piercing             Do not wear lotions, powders, cologne, or  deodorant              Men may shave face and neck.   Do not bring valuables to the hospital. [place].   Contacts, dentures or bridgework may not be worn into surgery.    Patients discharged on the day of surgery will not be allowed to drive home.   Special Instructions: Bring a copy of your healthcare power of attorney and living will documents         the day of surgery if you haven't scanned them before.              Please read over the following fact sheets you were given: IF YOU HAVE QUESTIONS ABOUT YOUR PRE-OP INSTRUCTIONS PLEASE CALL [phone]- [facility] - Preparing for Surgery Before surgery, you can play an important role.  Because skin is not sterile, your skin needs to be as free of germs as possible.  You can reduce the number of germs on your skin by washing with CHG (chlorahexidine gluconate) soap before surgery.  CHG is an antiseptic cleaner which kills germs and bonds with the skin to continue killing germs even after washing. Please  DO NOT use if you have an allergy to CHG or antibacterial soaps.  If your skin becomes reddened/irritated stop using the CHG and inform your nurse when you arrive at Short Stay. Do not shave (including legs and underarms) for at least 48 hours prior to the first CHG shower.  You may shave your face/neck.  Please follow these instructions carefully:  1.  Shower with CHG Soap the night before surgery and the  morning of surgery.  2.  If you choose to wash your hair, wash your hair first as usual with your normal  shampoo.  3.  After you shampoo, rinse your hair and body thoroughly to remove the shampoo.                             4.  Use CHG as you would any other liquid soap.  You can apply chg directly to the skin and wash.  Gently with a scrungie or clean washcloth.  5.  Apply the CHG Soap to your body ONLY FROM THE NECK DOWN.   Do   not use on face/ open                            Wound or open sores. Avoid contact with eyes, ears mouth and   genitals (private parts).                       Wash face,  Genitals (private parts) with your normal soap.             6.  Wash thoroughly, paying special attention to the area where your    surgery  will be performed.  7.  Thoroughly rinse your body with warm water from the neck down.  8.  DO NOT shower/wash with your normal soap after using and rinsing off the CHG Soap.                9.  Pat yourself dry with a clean towel.            10.  Wear clean pajamas.            11.  Place clean sheets on your bed the night of your first shower and do not  sleep with pets. Day of Surgery : Do not apply any lotions/deodorants the morning of surgery.  Please wear clean clothes to the hospital/surgery center.  FAILURE TO FOLLOW THESE INSTRUCTIONS MAY RESULT IN THE CANCELLATION OF YOUR SURGERY  PATIENT SIGNATURE_________________________________  NURSE SIGNATURE__________________________________  ________________________________________________________________________

## 2021-08-23 ENCOUNTER — Other Ambulatory Visit: Payer: Self-pay

## 2021-08-23 ENCOUNTER — Encounter (HOSPITAL_COMMUNITY): Payer: Self-pay | Admitting: Emergency Medicine

## 2021-08-23 ENCOUNTER — Encounter (HOSPITAL_COMMUNITY)
Admission: RE | Admit: 2021-08-23 | Discharge: 2021-08-23 | Disposition: A | Discharge status: Home / Self Care | Payer: 59 | Source: Ambulatory Visit | Attending: Urology | Admitting: Urology

## 2021-08-23 ENCOUNTER — Emergency Department (HOSPITAL_COMMUNITY)
Admission: EM | Admit: 2021-08-23 | Discharge: 2021-08-23 | Disposition: A | Discharge status: Home / Self Care | Payer: 59 | Attending: Emergency Medicine | Admitting: Emergency Medicine

## 2021-08-23 VITALS — BP 127/83 | HR 64 | Temp 98.5°F | Resp 14 | Ht 73.0 in

## 2021-08-23 DIAGNOSIS — I10 Essential (primary) hypertension: Secondary | ICD-10-CM

## 2021-08-23 DIAGNOSIS — Z8554 Personal history of malignant neoplasm of ureter: Secondary | ICD-10-CM | POA: Insufficient documentation

## 2021-08-23 DIAGNOSIS — E1142 Type 2 diabetes mellitus with diabetic polyneuropathy: Secondary | ICD-10-CM | POA: Diagnosis not present

## 2021-08-23 DIAGNOSIS — Z79899 Other long term (current) drug therapy: Secondary | ICD-10-CM | POA: Diagnosis not present

## 2021-08-23 DIAGNOSIS — E1165 Type 2 diabetes mellitus with hyperglycemia: Secondary | ICD-10-CM | POA: Diagnosis not present

## 2021-08-23 DIAGNOSIS — Z7984 Long term (current) use of oral hypoglycemic drugs: Secondary | ICD-10-CM | POA: Insufficient documentation

## 2021-08-23 DIAGNOSIS — Z01812 Encounter for preprocedural laboratory examination: Secondary | ICD-10-CM | POA: Insufficient documentation

## 2021-08-23 DIAGNOSIS — J45909 Unspecified asthma, uncomplicated: Secondary | ICD-10-CM | POA: Insufficient documentation

## 2021-08-23 DIAGNOSIS — F17210 Nicotine dependence, cigarettes, uncomplicated: Secondary | ICD-10-CM | POA: Diagnosis not present

## 2021-08-23 DIAGNOSIS — R739 Hyperglycemia, unspecified: Secondary | ICD-10-CM | POA: Diagnosis present

## 2021-08-23 LAB — URINALYSIS, ROUTINE W REFLEX MICROSCOPIC
Bacteria, UA: NONE SEEN
Bilirubin Urine: NEGATIVE
Glucose, UA: >=500 mg/dL — AB
Ketones, ur: NEGATIVE mg/dL
Leukocytes,Ua: NEGATIVE
Nitrite: NEGATIVE
Protein, ur: NEGATIVE mg/dL
RBC / HPF: >50 RBC/hpf — ABNORMAL HIGH (ref 0–5)
Specific Gravity, Urine: 1.024 (ref 1.005–1.030)
pH: 6 (ref 5.0–8.0)

## 2021-08-23 LAB — CBC
HCT: 39.1 % (ref 39.0–52.0)
Hemoglobin: 13.8 g/dL (ref 13.0–17.0)
MCH: 31.9 pg (ref 26.0–34.0)
MCHC: 35.3 g/dL (ref 30.0–36.0)
MCV: 90.5 fL (ref 80.0–100.0)
Platelets: 173 10*3/uL (ref 150–400)
RBC: 4.32 MIL/uL (ref 4.22–5.81)
RDW: 12.7 % (ref 11.5–15.5)
WBC: 7.8 10*3/uL (ref 4.0–10.5)
nRBC: 0 % (ref 0.0–0.2)

## 2021-08-23 LAB — CBC WITH DIFFERENTIAL/PLATELET
Abs Immature Granulocytes: 0.03 10*3/uL (ref 0.00–0.07)
Basophils Absolute: 0.1 10*3/uL (ref 0.0–0.1)
Basophils Relative: 1 %
Eosinophils Absolute: 0.2 10*3/uL (ref 0.0–0.5)
Eosinophils Relative: 2 %
HCT: 39 % (ref 39.0–52.0)
Hemoglobin: 13.7 g/dL (ref 13.0–17.0)
Immature Granulocytes: 0 %
Lymphocytes Relative: 16 %
Lymphs Abs: 1.1 10*3/uL (ref 0.7–4.0)
MCH: 31.4 pg (ref 26.0–34.0)
MCHC: 35.1 g/dL (ref 30.0–36.0)
MCV: 89.4 fL (ref 80.0–100.0)
Monocytes Absolute: 0.5 10*3/uL (ref 0.1–1.0)
Monocytes Relative: 7 %
Neutro Abs: 5.4 10*3/uL (ref 1.7–7.7)
Neutrophils Relative %: 74 %
Platelets: 149 10*3/uL — ABNORMAL LOW (ref 150–400)
RBC: 4.36 MIL/uL (ref 4.22–5.81)
RDW: 12.6 % (ref 11.5–15.5)
WBC: 7.3 10*3/uL (ref 4.0–10.5)
nRBC: 0 % (ref 0.0–0.2)

## 2021-08-23 LAB — BASIC METABOLIC PANEL
Anion gap: 10 (ref 5–15)
Anion gap: 10 (ref 5–15)
BUN: 21 mg/dL (ref 8–23)
BUN: 22 mg/dL (ref 8–23)
CO2: 25 mmol/L (ref 22–32)
CO2: 24 mmol/L (ref 22–32)
Calcium: 9.1 mg/dL (ref 8.9–10.3)
Calcium: 9 mg/dL (ref 8.9–10.3)
Chloride: 92 mmol/L — ABNORMAL LOW (ref 98–111)
Chloride: 94 mmol/L — ABNORMAL LOW (ref 98–111)
Creatinine, Ser: 1.23 mg/dL (ref 0.61–1.24)
Creatinine, Ser: 1.22 mg/dL (ref 0.61–1.24)
GFR, Estimated: >60 mL/min (ref >60)
GFR, Estimated: >60 mL/min (ref >60)
Glucose, Bld: 543 mg/dL (ref 70–99)
Glucose, Bld: 483 mg/dL — ABNORMAL HIGH (ref 70–99)
Potassium: 4.2 mmol/L (ref 3.5–5.1)
Potassium: 4.5 mmol/L (ref 3.5–5.1)
Sodium: 127 mmol/L — ABNORMAL LOW (ref 135–145)
Sodium: 128 mmol/L — ABNORMAL LOW (ref 135–145)

## 2021-08-23 LAB — CBG MONITORING, ED
Glucose-Capillary: 536 mg/dL (ref 70–99)
Glucose-Capillary: 334 mg/dL — ABNORMAL HIGH (ref 70–99)

## 2021-08-23 LAB — HEMOGLOBIN A1C
Hgb A1c MFr Bld: 11.2 % — ABNORMAL HIGH (ref 4.8–5.6)
Mean Plasma Glucose: 274.74 mg/dL

## 2021-08-23 LAB — GLUCOSE, CAPILLARY
Glucose-Capillary: 594 mg/dL (ref 70–99)
Glucose-Capillary: 596 mg/dL (ref 70–99)

## 2021-08-23 MED ORDER — INSULIN ASPART 100 UNIT/ML IJ SOLN
10.0000 [IU] | Freq: Once | INTRAMUSCULAR | Status: AC
  Administered 2021-08-23: 10 [IU] via SUBCUTANEOUS
  Filled 2021-08-23: qty 0.1

## 2021-08-23 MED ORDER — SODIUM CHLORIDE 0.9 % IV SOLN: INTRAVENOUS | Status: DC

## 2021-08-23 MED ORDER — SODIUM CHLORIDE 0.9 % IV BOLUS
1000.0000 mL | Freq: Once | INTRAVENOUS | Status: AC
  Administered 2021-08-23: 1000 mL via INTRAVENOUS

## 2021-08-23 NOTE — ED Triage Notes (Signed)
Reports dry mouth and increased urination that has been intermittent, this episode started this morning. Was getting pre-op testing and found CBG to be high, is going for a bladder procedure soon. Only one kidney d/t cancer.

## 2021-08-23 NOTE — Progress Notes (Signed)
Ended patient's pre op appointment early. BS check was 596. Octaviano Glow, PA immediately. She came and spoke with patient. She recommended he be seen in an emergency department. Drew a CBC, BMP, A1C, and got a urine sample. Walked patient down to ED check in desk.

## 2021-08-23 NOTE — ED Provider Notes (Signed)
[place] DEPT Provider Note   CSN: [id number] Arrival date & time: 08/23/21  1320     History Chief Complaint  Patient presents with   Hyperglycemia    Robert Burnett is a 60 y.o. male.  60 year old male who presents with hyperglycemia.  Patient has remote history of diabetes and states he has been noncompliant with his diabetic diet.  Only recently started back on his oral hypoglycemics.  Was in preop testing today when he found his blood sugar was high and he was sent here for evaluation.  He does endorse polyuria as well as polydipsia.  Denies any weakness.      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   Proteinuria 09/19/2018   Hematuria 09/19/2018   Anterior cervical adenopathy 09/19/2018   Fatty liver disease, nonalcoholic 40/98/1191   Hypertension 06/27/2016   Thoracic spondylosis with myelopathy 03/16/2013   Carpal tunnel syndrome 03/16/2013   Chest wall pain, chronic 03/16/2013   Type 2 diabetes mellitus with peripheral neuropathy ([place]) 03/16/2013   Debility 03/16/2013   Chronic pain syndrome 03/16/2013   Lumbar degenerative disc disease 12/12/2011   Costochondritis 12/12/2011   Diabetic peripheral neuropathy associated with type 2 diabetes mellitus ([place]) 12/12/2011    Past Surgical History:  Procedure Laterality Date   CYSTOSCOPY/RETROGRADE/URETEROSCOPY Left 10/29/2020   Procedure: CYSTOSCOPY/RETROGRADE/ LEFT URETEROSCOPY WITH BIOPSY, LEFT URETERAL STENT;  Surgeon:  Raynelle Bring, MD;  Location: [facility];  Service: Urology;  Laterality: Left;   KNEE SURGERY  1975 and 1989   MULTIPLE TOOTH EXTRACTIONS     ROBOT ASSITED LAPAROSCOPIC NEPHROURETERECTOMY Left 12/13/2020   Procedure: XI ROBOT ASSITED LAPAROSCOPIC NEPHROURETERECTOMY , POST OPERATIVE INTRAVESICAL GEMCITABINE;  Surgeon: Raynelle Bring, MD;  Location: [facility];  Service: Urology;  Laterality: Left;  ONLY NEEDS 240 MIN   TOEM AMPUTATION      2012   WISDOM TOOTH EXTRACTION         Family History  Problem Relation Age of Onset   Hypertension Father    Diabetes Father    Cancer Father    Diabetes Sister    Cancer Sister    Cancer Mother     Social History   Tobacco Use   Smoking status: Every Day    Packs/day: 2.00    Years: 5.00    Pack years: 10.00    Types: Pipe, Cigarettes   Smokeless tobacco: Former    Types: Nurse, children's Use: Never used  Substance Use Topics   Alcohol use: No   Drug use: No    Home Medications Prior to Admission medications   Medication Sig Start Date End Date Taking? Authorizing Provider  atenolol (TENORMIN) 100 MG tablet Take 0.5 tablets (50 mg total) by mouth daily. Patient taking differently: Take 100 mg by mouth daily. 01/06/19   Carlota Raspberry,  Ranell Patrick, MD  atorvastatin (LIPITOR) 80 MG tablet Take 80 mg by mouth daily. 04/25/21   [provider]  doxycycline (VIBRA-TABS) 100 MG tablet Take 1 tablet (100 mg total) by mouth 2 (two) times daily. 08/16/21   Trula Slade, DPM  fentaNYL (DURAGESIC) 75 MCG/HR Place 1 patch onto the skin every 3 (three) days. 03/30/19   [name], MD  finasteride (PROSCAR) 5 MG tablet Take 5 mg by mouth daily.    [provider]  FREESTYLE LITE test strip 1 each 2 (two) times daily. 08/08/21   [provider]  Lancets (FREESTYLE) lancets 1 each 2 (two) times daily. 08/08/21   [provider]  losartan-hydrochlorothiazide (HYZAAR) 50-12.5 MG tablet Take 1 tablet by mouth daily.     [provider]  metFORMIN (GLUCOPHAGE-XR) 500 MG 24 hr tablet Take 1,000 mg by mouth 2 (two) times daily. 04/25/21   [provider]  nitroGLYCERIN (NITRODUR - DOSED IN MG/24 HR) 0.2 mg/hr patch Place 1/4 to 1/2 of a patch over affected region. Remove and replace once daily.  Slightly alter skin placement daily 08/01/21   Gregor Hams, MD  Oxycodone HCl 10 MG TABS Take by mouth. 07/10/21   [provider]  pantoprazole (PROTONIX) 40 MG tablet Take 1 tablet (40 mg total) by mouth daily. Patient taking differently: Take 40 mg by mouth daily as needed (acid reflux/indigestion). 01/06/19   Wendie Agreste, MD  silver sulfADIAZINE (SILVADENE) 1 % cream Apply 1 application topically daily. 05/21/21   Trula Slade, DPM    Allergies    Prozac [fluoxetine hcl] and Amlodipine  Review of Systems   Review of Systems  All other systems reviewed and are negative.  Physical Exam Updated Vital Signs BP (!) 142/86 (BP Location: Left Arm)   Pulse 78   Temp 97.8 F (36.6 C) (Oral)   Resp 16   Ht 1.854 m (6\' 1" )   Wt 89 kg   SpO2 99%   BMI 25.89 kg/m   Physical Exam Vitals and nursing note reviewed.  Constitutional:      General: He is not in acute distress.    Appearance: Normal appearance. He is well-developed. He is not toxic-appearing.  HENT:     Head: Normocephalic and atraumatic.  Eyes:     General: Lids are normal.     Conjunctiva/sclera: Conjunctivae normal.     Pupils: Pupils are equal, round, and reactive to light.  Neck:     Thyroid: No thyroid mass.     Trachea: No tracheal deviation.  Cardiovascular:     Rate and Rhythm: Normal rate and regular rhythm.     Heart sounds: Normal heart sounds. No murmur heard.   No gallop.  Pulmonary:     Effort: Pulmonary effort is normal. No respiratory distress.     Breath sounds: Normal breath sounds. No stridor. No decreased breath sounds, wheezing, rhonchi or rales.  Abdominal:     General: There is no  distension.     Palpations: Abdomen is soft.     Tenderness: There is no abdominal tenderness. There is no rebound.  Musculoskeletal:        General: No tenderness. Normal range of motion.     Cervical back: Normal range of motion and neck supple.  Skin:    General: Skin is warm and dry.     Findings: No abrasion or rash.  Neurological:     Mental Status: He is alert and  oriented to person, place, and time. Mental status is at baseline.     GCS: GCS eye subscore is 4. GCS verbal subscore is 5. GCS motor subscore is 6.     Cranial Nerves: No cranial nerve deficit.     Sensory: No sensory deficit.     Motor: Motor function is intact.  Psychiatric:        Attention and Perception: Attention normal.        Speech: Speech normal.        Behavior: Behavior normal.    ED Results / Procedures / Treatments   Labs (all labs ordered are listed, but only abnormal results are displayed) Labs Reviewed  CBG MONITORING, ED - Abnormal; Notable for the following components:      Result Value   Glucose-Capillary 536 (*)    All other components within normal limits  CBC WITH DIFFERENTIAL/PLATELET  BASIC METABOLIC PANEL    EKG None  Radiology No results found.  Procedures Procedures   Medications Ordered in ED Medications  sodium chloride 0.9 % bolus 1,000 mL (has no administration in time range)  0.9 %  sodium chloride infusion (has no administration in time range)  insulin aspart (novoLOG) injection 10 Units (has no administration in time range)    ED Course  I have reviewed the triage vital signs and the nursing notes.  Pertinent labs & imaging results that were available during my care of the patient were reviewed by me and considered in my medical decision making (see chart for details).    MDM Rules/Calculators/A&P                           Patient without evidence of DKA here.  Blood sugar managed with IV fluids as well as insulin and has improved.  Patient instructed to  follow a strict diabetic diet and take his metformin as directed Final Clinical Impression(s) / ED Diagnoses Final diagnoses:  None    Rx / DC Orders ED Discharge Orders     None        Lacretia Leigh, MD 08/23/21 1603

## 2021-08-27 NOTE — Progress Notes (Signed)
Anesthesia Chart Review   Case: [id number] Date/Time: 08/29/21 1301   Procedure: TRANSURETHRAL RESECTION OF BLADDER TUMOR (TURBT) WITH CYSTOSCOPY - GENERAL ANESTHESIA WITH PARALYSIS   Anesthesia type: General   Pre-op diagnosis: BLADDER CANCER   Location: [place] / [facility]   Surgeons: Raynelle Bring, MD       DISCUSSION:60 y.o. every day smoker with h/o HTN, GERD, uncontrolled diabetes, bladder cancer scheduled for above procedure 08/29/2021 with Dr. Raynelle Bring.   CBG 596 at PAT visit.  Pt reports he was recently started back on metformin. Pt referred to the ED for further evaluation. Surgeon made aware.   No evidence of DKA, blood sugar improved with IV fluids and insulin.   Poorly controlled diabetes, A1C 11.2.  Pt needs evaluation by PCP and better optimization of blood glucose prior to procedure. Discussed with Dr. Lynne Logan office.  Discussed risk of same day cancellation.  VS: BP 127/83   Pulse 64   Temp 36.9 C (Oral)   Resp 14   Ht 6\' 1"  (1.854 m)   SpO2 96%   BMI 25.70 kg/m   PROVIDERS: [name], MD is PCP    LABS:  pt referred to ED for evaluation, surgeon made aware (all labs ordered are listed, but only abnormal results are displayed)  Labs Reviewed  HEMOGLOBIN A1C - Abnormal; Notable for the following components:      Result Value   Hgb A1c MFr Bld 11.2 (*)    All other components within normal limits  BASIC METABOLIC PANEL - Abnormal; Notable for the following components:   Sodium 127 (*)    Chloride 92 (*)    Glucose, Bld 543 (*)    All other components within normal limits  GLUCOSE, CAPILLARY - Abnormal; Notable for the following components:   Glucose-Capillary 596 (*)    All other components within normal limits  GLUCOSE, CAPILLARY - Abnormal; Notable for the following components:   Glucose-Capillary 594 (*)    All other components within normal limits  URINALYSIS, ROUTINE W REFLEX MICROSCOPIC - Abnormal; Notable for the following  components:   Glucose, UA >=500 (*)    Hgb urine dipstick LARGE (*)    RBC / HPF >50 (*)    All other components within normal limits  CBC     IMAGES:   EKG: 10/25/2020 Rate 57 bpm  Sinus bradycardia with sinus arrhythmia  CV:  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([place])     Past Surgical History:  Procedure Laterality Date   CYSTOSCOPY/RETROGRADE/URETEROSCOPY Left 10/29/2020   Procedure: CYSTOSCOPY/RETROGRADE/ LEFT URETEROSCOPY WITH BIOPSY, LEFT URETERAL STENT;  Surgeon: Raynelle Bring, MD;  Location[facility];  Service: Urology;  Laterality: Left;   KNEE SURGERY Bilateral 1975 and 1989   MULTIPLE TOOTH EXTRACTIONS     ROBOT ASSITED LAPAROSCOPIC NEPHROURETERECTOMY Left 12/13/2020   Procedure: XI ROBOT ASSITED LAPAROSCOPIC NEPHROURETERECTOMY , POST OPERATIVE INTRAVESICAL GEMCITABINE;  Surgeon: Raynelle Bring, MD;  Location: [facility];  Service: Urology;  Laterality: Left;  ONLY  NEEDS 240 MIN   TOEM AMPUTATION  Left    2012, second toe   WISDOM TOOTH EXTRACTION      MEDICATIONS:  atenolol (TENORMIN) 100 MG tablet   atorvastatin (LIPITOR) 80 MG tablet   doxycycline (VIBRA-TABS) 100 MG tablet   fentaNYL (DURAGESIC) 75 MCG/HR   finasteride (PROSCAR) 5 MG tablet   FREESTYLE LITE test strip   Lancets (FREESTYLE) lancets   losartan-hydrochlorothiazide (HYZAAR) 50-12.5 MG tablet   metFORMIN (GLUCOPHAGE-XR) 500 MG 24 hr tablet   nitroGLYCERIN (NITRODUR - DOSED IN MG/24 HR) 0.2 mg/hr patch   Oxycodone HCl 10 MG TABS   pantoprazole (PROTONIX) 40 MG tablet   silver sulfADIAZINE (SILVADENE) 1 % cream   No current facility-administered medications for this encounter.     Robert Felix Ward, PA-C [facility] [phone]

## 2021-08-28 ENCOUNTER — Encounter (HOSPITAL_COMMUNITY): Payer: Self-pay

## 2021-08-28 ENCOUNTER — Encounter (HOSPITAL_COMMUNITY)
Admission: RE | Admit: 2021-08-28 | Discharge: 2021-08-28 | Disposition: A | Discharge status: Home / Self Care | Payer: 59 | Source: Ambulatory Visit | Attending: Urology | Admitting: Urology

## 2021-08-28 ENCOUNTER — Other Ambulatory Visit: Payer: Self-pay

## 2021-08-28 NOTE — Progress Notes (Addendum)
Patient was interviewed over the phone. Instructions were gone over verbally and emailed to patients personal email that he provided.  COVID swab appointment: n/a   COVID Vaccine Completed: yes x2 Date COVID Vaccine completed: Has received booster: COVID vaccine manufacturer: Pfizer       Date of COVID positive in last 90 days: no   PCP - [name], MD Cardiologist - n/a   Chest x-ray - CT 11/29/20 Epic EKG - 10/25/20 Epic Stress Test - n/a ECHO - n/a Cardiac Cath - n/a Pacemaker/ICD device last checked: n/a Spinal Cord Stimulator: n/a   Sleep Study - n/a CPAP -    Fasting Blood Sugar - post ED admission BS 150s-240s Checks Blood Sugar _ 3-6 times a day the past few days.   Blood Thinner Instructions: n/a Aspirin Instructions: Last Dose:   Activity level: Can perform activities of daily living without stopping and without symptoms of chest pain or shortness of breath. Pt avoids stairs due to arthritis, states he can go up short flight. Reports severe arthritis that causes chest/sternum pain that does interfere with ADLs                            Anesthesia review: open cyst to bottom of foot, DM, A1C 11.2 08/23/21, HTN, in ED 11/4  for BS 596   Patient denies shortness of breath, fever, cough and chest pain at PAT appointment     Patient verbalized understanding of instructions that were given to them at the PAT appointment. Patient was also instructed that they will need to review over the PAT instructions again at home before surgery.

## 2021-08-28 NOTE — H&P (Signed)
Office Visit Report     08/21/2021   --------------------------------------------------------------------------------   Robert Burnett  MRN: [id number]  DOB: 11-Jan-1961, 60 year old Male  SSN:    PRIMARY CARE:  Fannie Knee. [name], MD  REFERRING:  Fannie Knee. [name], MD  PROVIDER:  Raynelle Bring, M.D.  LOCATION:  [facility] [phone]     --------------------------------------------------------------------------------   CC/HPI: Urothelial carcinoma of the left ureter   Robert Burnett returns today for cystoscopic surveillance of his urothelial carcinoma. He states that he has noted some intermittent mild gross hematuria approximately 6 times since his last visit. He denies a change in voiding habits and has still been urinating relatively frequently and urgently as well as getting up 2-3 times per night. He did recently see his primary care physician, Dr. [name], who started him on metformin.     ALLERGIES: Blood Pressure Med (Can't Remember The Name Of It) Prozac - Skin Rash    MEDICATIONS: Doxycycline Hyclate  Finasteride 5 mg tablet 1 tablet PO Daily  Atenolol 100 mg tablet  Fentanyl  Losartan-Hydrochlorothiazide 50 mg-12.5 mg tablet  Oxycodone Hcl 10 mg tablet  Protonix 40 mg tablet, delayed release     GU PSH: Cysto Uretero Biopsy Fulgura, Left - 10/29/2020 Cystoscopy - 04/02/2021, 09/12/2020 Cystoscopy Insert Stent, Left - 10/29/2020 Inject For cystogram - 12/18/2020 Lap Nephro Ureterectomy, Left - 12/13/2020     NON-GU PSH: Foot/toes Surgery Procedure, Left, 2nd toe removed     GU PMH: Left ureteral cancer - 04/02/2021, - 01/22/2021, - 12/18/2020, - 11/09/2020 BPH w/o LUTS - 09/12/2020 Gross hematuria - 09/12/2020, - 04/12/2020 Kidney Failure Unspec      PMH Notes:   1) Urothelial carcinoma: He initially presented to [facility] in the summer of 2021 with painless, gross hematuria. Despite recommendations and due to financial concerns, he did not follow up as  recommended initially. He presented to me in November 2021 with recurrent gross hematuria and clots. He agreed to undergo an evaluation with CT imaging and cystoscopy which revealed findings consistent with a large left ureteral tumor and poorly functional left kidney. He is s/p left RAL nephroureterectomy on 12/13/20.  Pathology: High-grade, T1 urothelial carcinoma with negative surgical margins   Nov-Dec 2021: Cystoscopy - unremarkable, CT imaging - Large mid/distal left ureteral tumor with poorly functional left kidney, no evidence of metastatic or locally advanced disease.  Jan 2022: Left ureteroscopy - Cytology (atypical), Biopsy (low grade papillary urothelial carcinoma), Left ureteral stent  Feb 2022: Left RAL nephroureterectomy (high grade, T1 urothelial carcinoma with negative surgical margins)   NON-GU PMH: Arthritis Depression GERD Hypercholesterolemia Hypertension    FAMILY HISTORY: Bone Cancer - Grandmother Breast Cancer - Mother Death In The Family Father - Father Death In The Family Mother - Mother Lung Cancer - Father pancreatic cancer - Sister   SOCIAL HISTORY: Marital Status: Single Preferred Language: English; Ethnicity: Not Hispanic Or Latino; Race: White Current Smoking Status: Patient does not smoke anymore. Has not smoked since 03/20/2018. Smoked for 8 years. Smoked 1 pack per day.   Tobacco Use Assessment Completed: Used Tobacco in last 30 days? Drinks 3 caffeinated drinks per day. Patient's occupation is/was disabled.     Notes: Smokes a pipe occasionally   REVIEW OF SYSTEMS:    GU Review Male:   Patient denies frequent urination, hard to postpone urination, burning/ pain with urination, get up at night to urinate, leakage of urine, stream starts and stops, trouble starting your streams, and  have to strain to urinate .  Gastrointestinal (Upper):   Patient denies nausea and vomiting.  Gastrointestinal (Lower):   Patient denies diarrhea and constipation.   Constitutional:   Patient denies fever, night sweats, weight loss, and fatigue.  Skin:   Patient denies skin rash/ lesion and itching.  Eyes:   Patient denies blurred vision and double vision.  Ears/ Nose/ Throat:   Patient denies sore throat and sinus problems.  Hematologic/Lymphatic:   Patient denies swollen glands and easy bruising.  Cardiovascular:   Patient denies leg swelling and chest pains.  Respiratory:   Patient denies cough and shortness of breath.  Endocrine:   Patient denies excessive thirst.  Musculoskeletal:   Patient denies back pain and joint pain.  Neurological:   Patient denies headaches and dizziness.  Psychologic:   Patient denies depression and anxiety.   VITAL SIGNS:      08/21/2021 01:34 PM  Weight 193 lb / 87.54 kg  Height 73 in / 185.42 cm  BP 126/82 mmHg  Pulse 73 /min  Temperature 96.7 F / 35.9 C  BMI 25.5 kg/m   MULTI-SYSTEM PHYSICAL EXAMINATION:    Constitutional: Well-nourished. No physical deformities. Normally developed. Good grooming.  Neck: Neck symmetrical, not swollen. Normal tracheal position.  Respiratory: No labored breathing, no use of accessory muscles. Clear bilaterally.  Cardiovascular: Normal temperature, normal extremity pulses, no swelling, no varicosities. Regular rate and rhythm.  Lymphatic: No enlargement of neck, axillae, groin.  Skin: No paleness, no jaundice, no cyanosis. No lesion, no ulcer, no rash.  Neurologic / Psychiatric: Oriented to time, oriented to place, oriented to person. No depression, no anxiety, no agitation.  Gastrointestinal: No mass, no tenderness, no rigidity, non obese abdomen.  Eyes: Normal conjunctivae. Normal eyelids.  Ears, Nose, Mouth, and Throat: Left ear no scars, no lesions, no masses. Right ear no scars, no lesions, no masses. Nose no scars, no lesions, no masses. Normal hearing. Normal lips.  Musculoskeletal: Normal gait and station of head and neck.     Complexity of Data:  Records Review:    Previous Patient Records   PROCEDURES:         Flexible Cystoscopy - 52000  Indication: Urothelial carcinoma of the left ureter Risks, benefits, and potential complications of the procedure were discussed with the patient including infection, bleeding, voiding discomfort, urinary retention, fever, chills, sepsis, and others. All questions were answered. Informed consent was obtained. Sterile technique and intraurethral analgesia were used.  Meatus:  Normal size. Normal location. Normal condition.  Urethra:  No strictures.  External Sphincter:  Normal.  Verumontanum:  Normal.  Prostate:  Moderate hyperplasia. Non-obstructing.  Bladder Neck:  Non-obstructing.  Ureteral Orifices:  The left ureteral orifice is surgically absent. Right ureteral orifices in its expected anatomic location and effluxing clear urine.  Bladder:  A systematic examination of bladder was performed. There is a papillary tumor noted just within the bladder neck posteriorly. In addition, there are small papillary tumors located on both the right and left sides of the trigone. Posteriorly and extending up toward the dome, there are also multiple papillary tumors with the largest measuring 1.5 to 2 cm.      Chaperone: BS The procedure was well-tolerated and without complications. Instructions were given to call the office immediately if questions or problems.         Urinalysis w/Scope Dipstick Dipstick Cont'd Micro  Color: Amber Bilirubin: Neg mg/dL WBC/hpf: 0 - 5/hpf  Appearance: Clear Ketones: Neg mg/dL RBC/hpf: 3 -  10/hpf  Specific Gravity: 1.025 Blood: Trace ery/uL Bacteria: Rare (0-9/hpf)  pH: 6.0 Protein: 1+ mg/dL Cystals: NS (Not Seen)  Glucose: 3+ mg/dL Urobilinogen: 1.0 mg/dL Casts: NS (Not Seen)    Nitrites: Neg Trichomonas: Not Present    Leukocyte Esterase: Neg leu/uL Mucous: Not Present      Epithelial Cells: 0 - 5/hpf      Yeast: NS (Not Seen)      Sperm: Not Present    ASSESSMENT:      ICD-10 Details   1 GU:   Left ureteral cancer - C66.2   2   Urinary Frequency - R35.0   3   BPH w/LUTS - N40.1   4   Bladder tumor/neoplasm - D41.4    PLAN:           Orders         Schedule Labs: 4 Months - PSA with Reflex    4 Months - CMP    4 Months - Urinalysis  Lab Notes: Washing  X-Rays: 4 Months - C.T. Chest/ Abd/Pelvis With I.V. Contrast  Return Visit/Planned Activity: 4 Months - Office Visit, Cystoscopy  Return Visit/Planned Activity: Other See Visit Notes             Note: Will call to schedule surgery.          Document Letter(s):  Created for Patient: Clinical Summary         Notes:   1. Urothelial carcinoma of the left ureter/bladder: We reviewed his cystoscopic evaluation which does demonstrate multiple small bladder tumor recurrences. A bladder washing has been obtained for cytology. We discussed the need to proceed with cystoscopy and transurethral resection of his bladder tumors in the operating room. We reviewed this procedure in detail including the potential risks, complications, and expected recovery process. This will be scheduled for the near future. We will also plan to tentatively schedule him for his surveillance imaging and follow-up in approximately 4 months. Pending his upcoming TURBT, we will discuss whether he would require subsequent adjuvant therapy.   CC: Dr. [name]           Next Appointment:      Next Appointment: 08/29/2021 02:45 PM    Appointment Type: Surgery     Location: [facility] [phone]    Provider: Raynelle Bring, M.D.    Reason for Visit: [facility]/OP CYSTO , TURBT      * Signed by Raynelle Bring, M.D. on 08/21/21 at 7:33 PM (EDT*

## 2021-08-28 NOTE — Anesthesia Preprocedure Evaluation (Addendum)
Anesthesia Evaluation  Patient identified by MRN, date of birth, ID band Patient awake    Reviewed: Allergy & Precautions, NPO status , Patient's Chart, lab work & pertinent test results  Airway Mallampati: II  TM Distance: >3 FB Neck ROM: Full    Dental no notable dental hx.    Pulmonary asthma , Current Smoker,    Pulmonary exam normal breath sounds clear to auscultation       Cardiovascular hypertension, Pt. on medications Normal cardiovascular exam Rhythm:Regular Rate:Normal     Neuro/Psych Chronic pain  Uses fentanyl patches  Neuromuscular disease negative psych ROS   GI/Hepatic Neg liver ROS, GERD  ,  Endo/Other  diabetes, Poorly Controlled  Renal/GU negative Renal ROS  negative genitourinary   Musculoskeletal negative musculoskeletal ROS (+)   Abdominal   Peds negative pediatric ROS (+)  Hematology negative hematology ROS (+)   Anesthesia Other Findings   Reproductive/Obstetrics negative OB ROS                            Anesthesia Physical Anesthesia Plan  ASA: 3  Anesthesia Plan: General   Post-op Pain Management:    Induction: Intravenous  PONV Risk Score and Plan: 1 and Ondansetron and Treatment may vary due to age or medical condition  Airway Management Planned: Oral ETT  Additional Equipment:   Intra-op Plan:   Post-operative Plan: Extubation in OR  Informed Consent: I have reviewed the patients History and Physical, chart, labs and discussed the procedure including the risks, benefits and alternatives for the proposed anesthesia with the patient or authorized representative who has indicated his/her understanding and acceptance.     Dental advisory given  Plan Discussed with: CRNA and Surgeon  Anesthesia Plan Comments: (See PAT note 08/23/2021, Konrad Felix Ward, PA-C)       Anesthesia Quick Evaluation

## 2021-08-28 NOTE — Patient Instructions (Addendum)
DUE TO COVID-19 ONLY ONE VISITOR IS ALLOWED TO COME WITH YOU AND STAY IN THE WAITING ROOM ONLY DURING PRE OP AND PROCEDURE.   **NO VISITORS ARE ALLOWED IN THE SHORT STAY AREA OR RECOVERY ROOM!!**       Your procedure is scheduled on: 08/29/21   Report to [facility] Main Entrance    Report to admitting at 11:00 AM   Call this number if you have problems the morning of surgery [phone]   Follow clear liquid diet instructions given to you by surgeons office day before surgery.   May have liquids until 10:15 AM day of surgery  CLEAR LIQUID DIET  Foods Allowed                                                                     Foods Excluded  Water, Black Coffee and tea (no milk or creamer)           liquids that you cannot  Plain Jell-O in any flavor  (No red)                                    see through such as: Fruit ices (not with fruit pulp)                                            milk, soups, orange juice              Iced Popsicles (No red)                                                 All solid food                                   Apple juices Sports drinks like Gatorade (No red) Lightly seasoned clear broth or consume(fat free) Sugar    Oral Hygiene is also important to reduce your risk of infection.                                    Remember - BRUSH YOUR TEETH THE MORNING OF SURGERY WITH YOUR REGULAR TOOTHPASTE   Do NOT smoke after Midnight   Take these medicines the morning of surgery with A SIP OF WATER: Atenolol, Lipitor, Proscar, Protonix  DO NOT TAKE ANY ORAL DIABETIC MEDICATIONS DAY OF YOUR SURGERY  How to Manage Your Diabetes Before and After Surgery  Why is it important to control my blood sugar before and after surgery? Improving blood sugar levels before and after surgery helps healing and can limit problems. A way of improving blood sugar control is eating a healthy diet by:  Eating less sugar and carbohydrates  Increasing  activity/exercise  Talking with your doctor about reaching your blood sugar goals  High blood sugars (greater than 180 mg/dL) can raise your risk of infections and slow your recovery, so you will need to focus on controlling your diabetes during the weeks before surgery. Make sure that the doctor who takes care of your diabetes knows about your planned surgery including the date and location.  How do I manage my blood sugar before surgery? Check your blood sugar at least 4 times a day, starting 2 days before surgery, to make sure that the level is not too high or low. Check your blood sugar the morning of your surgery when you wake up and every 2 hours until you get to the Short Stay unit. If your blood sugar is less than 70 mg/dL, you will need to treat for low blood sugar: Do not take insulin. Treat a low blood sugar (less than 70 mg/dL) with  cup of clear juice (cranberry or apple), 4 glucose tablets, OR glucose gel. Recheck blood sugar in 15 minutes after treatment (to make sure it is greater than 70 mg/dL). If your blood sugar is not greater than 70 mg/dL on recheck, call [phone] for further instructions. Report your blood sugar to the short stay nurse when you get to Short Stay.  If you are admitted to the hospital after surgery: Your blood sugar will be checked by the staff and you will probably be given insulin after surgery (instead of oral diabetes medicines) to make sure you have good blood sugar levels. The goal for blood sugar control after surgery is 80-180 mg/dL.   WHAT DO I DO ABOUT MY DIABETES MEDICATION?  Do not take oral diabetes medicines (pills) the morning of surgery.  THE DAY BEFORE SURGERY, take Metformin as prescribed       THE MORNING OF SURGERY, do not take Metformin   Reviewed and Endorsed by [facility] Patient Education Committee, August 2015                               You may not have any metal on your body including jewelry, and body piercing              Do not wear lotions, powders, cologne, or deodorant              Men may shave face and neck.   Do not bring valuables to the hospital. [place].    Patients discharged on the day of surgery will not be allowed to drive home.              Please read over the following fact sheets you were given: IF YOU HAVE QUESTIONS ABOUT YOUR PRE-OP INSTRUCTIONS PLEASE CALL [facility] - Preparing for Surgery Before surgery, you can play an important role.  Because skin is not sterile, your skin needs to be as free of germs as possible.  You can reduce the number of germs on your skin by washing with CHG (chlorahexidine gluconate) soap before surgery.  CHG is an antiseptic cleaner which kills germs and bonds with the skin to continue killing germs even after washing. Please DO NOT use if you have an allergy to CHG or antibacterial soaps.  If your skin becomes reddened/irritated stop using the CHG and inform your nurse when you arrive at Short Stay. Do not shave (including legs  and underarms) for at least 48 hours prior to the first CHG shower.  You may shave your face/neck.  Please follow these instructions carefully:  1.  Shower with CHG Soap the night before surgery and the  morning of surgery.  2.  If you choose to wash your hair, wash your hair first as usual with your normal  shampoo.  3.  After you shampoo, rinse your hair and body thoroughly to remove the shampoo.                             4.  Use CHG as you would any other liquid soap.  You can apply chg directly to the skin and wash.  Gently with a scrungie or clean washcloth.  5.  Apply the CHG Soap to your body ONLY FROM THE NECK DOWN.   Do   not use on face/ open                           Wound or open sores. Avoid contact with eyes, ears mouth and   genitals (private parts).                       Wash face,  Genitals (private parts) with your normal soap.              6.  Wash thoroughly, paying special attention to the area where your    surgery  will be performed.  7.  Thoroughly rinse your body with warm water from the neck down.  8.  DO NOT shower/wash with your normal soap after using and rinsing off the CHG Soap.                9.  Pat yourself dry with a clean towel.            10.  Wear clean pajamas.            11.  Place clean sheets on your bed the night of your first shower and do not  sleep with pets. Day of Surgery : Do not apply any lotions/deodorants the morning of surgery.  Please wear clean clothes to the hospital/surgery center.  FAILURE TO FOLLOW THESE INSTRUCTIONS MAY RESULT IN THE CANCELLATION OF YOUR SURGERY  PATIENT SIGNATURE_________________________________  NURSE SIGNATURE__________________________________  ________________________________________________________________________

## 2021-08-29 ENCOUNTER — Ambulatory Visit (HOSPITAL_COMMUNITY): Payer: 59 | Admitting: Physician Assistant

## 2021-08-29 ENCOUNTER — Ambulatory Visit (HOSPITAL_COMMUNITY): Payer: 59 | Admitting: Anesthesiology

## 2021-08-29 ENCOUNTER — Ambulatory Visit (HOSPITAL_COMMUNITY)
Admission: RE | Admit: 2021-08-29 | Discharge: 2021-08-29 | Disposition: A | Discharge status: Home / Self Care | Payer: 59 | Source: Ambulatory Visit | Attending: Urology | Admitting: Urology

## 2021-08-29 ENCOUNTER — Encounter (HOSPITAL_COMMUNITY)
Admission: RE | Disposition: A | Discharge status: Home / Self Care | Payer: Self-pay | Source: Ambulatory Visit | Attending: Urology

## 2021-08-29 DIAGNOSIS — F17200 Nicotine dependence, unspecified, uncomplicated: Secondary | ICD-10-CM | POA: Diagnosis not present

## 2021-08-29 DIAGNOSIS — R350 Frequency of micturition: Secondary | ICD-10-CM | POA: Diagnosis not present

## 2021-08-29 DIAGNOSIS — Z8554 Personal history of malignant neoplasm of ureter: Secondary | ICD-10-CM | POA: Insufficient documentation

## 2021-08-29 DIAGNOSIS — Z906 Acquired absence of other parts of urinary tract: Secondary | ICD-10-CM | POA: Insufficient documentation

## 2021-08-29 DIAGNOSIS — C675 Malignant neoplasm of bladder neck: Secondary | ICD-10-CM | POA: Diagnosis not present

## 2021-08-29 DIAGNOSIS — N401 Enlarged prostate with lower urinary tract symptoms: Secondary | ICD-10-CM | POA: Diagnosis not present

## 2021-08-29 DIAGNOSIS — C679 Malignant neoplasm of bladder, unspecified: Secondary | ICD-10-CM | POA: Insufficient documentation

## 2021-08-29 DIAGNOSIS — E119 Type 2 diabetes mellitus without complications: Secondary | ICD-10-CM | POA: Diagnosis not present

## 2021-08-29 HISTORY — PX: TRANSURETHRAL RESECTION OF BLADDER TUMOR: SHX2575

## 2021-08-29 LAB — GLUCOSE, CAPILLARY
Glucose-Capillary: 217 mg/dL — ABNORMAL HIGH (ref 70–99)
Glucose-Capillary: 243 mg/dL — ABNORMAL HIGH (ref 70–99)

## 2021-08-29 SURGERY — TURBT (TRANSURETHRAL RESECTION OF BLADDER TUMOR)
Anesthesia: General

## 2021-08-29 MED ORDER — ROCURONIUM BROMIDE 10 MG/ML (PF) SYRINGE
PREFILLED_SYRINGE | INTRAVENOUS | Status: AC
  Filled 2021-08-29: qty 10

## 2021-08-29 MED ORDER — ONDANSETRON HCL 4 MG/2ML IJ SOLN: 4.0000 mg | Freq: Once | INTRAMUSCULAR | Status: DC | PRN

## 2021-08-29 MED ORDER — DEXAMETHASONE SODIUM PHOSPHATE 10 MG/ML IJ SOLN
INTRAMUSCULAR | Status: AC
  Filled 2021-08-29: qty 1

## 2021-08-29 MED ORDER — FENTANYL CITRATE (PF) 100 MCG/2ML IJ SOLN
INTRAMUSCULAR | Status: AC
  Filled 2021-08-29: qty 2

## 2021-08-29 MED ORDER — LACTATED RINGERS IV SOLN: INTRAVENOUS | Status: DC

## 2021-08-29 MED ORDER — ONDANSETRON HCL 4 MG/2ML IJ SOLN
INTRAMUSCULAR | Status: DC | PRN
  Administered 2021-08-29: 4 mg via INTRAVENOUS

## 2021-08-29 MED ORDER — PROPOFOL 10 MG/ML IV BOLUS
INTRAVENOUS | Status: AC
  Filled 2021-08-29: qty 20

## 2021-08-29 MED ORDER — OXYCODONE HCL 5 MG PO TABS
5.0000 mg | ORAL_TABLET | Freq: Once | ORAL | Status: AC | PRN
  Administered 2021-08-29: 5 mg via ORAL

## 2021-08-29 MED ORDER — SUGAMMADEX SODIUM 200 MG/2ML IV SOLN
INTRAVENOUS | Status: DC | PRN
  Administered 2021-08-29: 200 mg via INTRAVENOUS

## 2021-08-29 MED ORDER — ORAL CARE MOUTH RINSE
15.0000 mL | Freq: Once | OROMUCOSAL | Status: AC
  Administered 2021-08-29: 15 mL via OROMUCOSAL

## 2021-08-29 MED ORDER — HYDROMORPHONE HCL 1 MG/ML IJ SOLN
INTRAMUSCULAR | Status: AC
  Filled 2021-08-29: qty 1

## 2021-08-29 MED ORDER — ONDANSETRON HCL 4 MG/2ML IJ SOLN
INTRAMUSCULAR | Status: AC
  Filled 2021-08-29: qty 2

## 2021-08-29 MED ORDER — CHLORHEXIDINE GLUCONATE 0.12 % MT SOLN: 15.0000 mL | Freq: Once | OROMUCOSAL | Status: AC

## 2021-08-29 MED ORDER — GEMCITABINE CHEMO FOR BLADDER INSTILLATION 2000 MG
2000.0000 mg | Freq: Once | INTRAVENOUS | Status: AC
  Administered 2021-08-29: 2000 mg via INTRAVESICAL
  Filled 2021-08-29: qty 2000

## 2021-08-29 MED ORDER — DEXAMETHASONE SODIUM PHOSPHATE 10 MG/ML IJ SOLN
INTRAMUSCULAR | Status: DC | PRN
  Administered 2021-08-29: 10 mg via INTRAVENOUS

## 2021-08-29 MED ORDER — PROPOFOL 10 MG/ML IV BOLUS
INTRAVENOUS | Status: DC | PRN
  Administered 2021-08-29: 150 mg via INTRAVENOUS

## 2021-08-29 MED ORDER — FENTANYL CITRATE (PF) 100 MCG/2ML IJ SOLN
INTRAMUSCULAR | Status: DC | PRN
  Administered 2021-08-29 (×4): 50 ug via INTRAVENOUS

## 2021-08-29 MED ORDER — ACETAMINOPHEN 10 MG/ML IV SOLN
INTRAVENOUS | Status: AC
  Administered 2021-08-29: 1000 mg via INTRAVENOUS
  Filled 2021-08-29: qty 100

## 2021-08-29 MED ORDER — LIDOCAINE HCL (PF) 2 % IJ SOLN
INTRAMUSCULAR | Status: AC
  Filled 2021-08-29: qty 5

## 2021-08-29 MED ORDER — MIDAZOLAM HCL 5 MG/5ML IJ SOLN
INTRAMUSCULAR | Status: DC | PRN
Start: 2021-08-29 — End: 2021-08-29
  Administered 2021-08-29: 2 mg via INTRAVENOUS

## 2021-08-29 MED ORDER — PHENAZOPYRIDINE HCL 200 MG PO TABS: 200.0000 mg | ORAL_TABLET | Freq: Three times a day (TID) | ORAL | 0 refills | Status: DC | PRN

## 2021-08-29 MED ORDER — MIDAZOLAM HCL 2 MG/2ML IJ SOLN
INTRAMUSCULAR | Status: AC
  Filled 2021-08-29: qty 2

## 2021-08-29 MED ORDER — OXYCODONE HCL 5 MG PO TABS
ORAL_TABLET | ORAL | Status: AC
  Filled 2021-08-29: qty 1

## 2021-08-29 MED ORDER — LIDOCAINE 2% (20 MG/ML) 5 ML SYRINGE
INTRAMUSCULAR | Status: DC | PRN
  Administered 2021-08-29: 100 mg via INTRAVENOUS

## 2021-08-29 MED ORDER — SODIUM CHLORIDE 0.9 % IR SOLN
Status: DC | PRN
  Administered 2021-08-29: 1000 mL via INTRAVESICAL
  Administered 2021-08-29: 3000 mL

## 2021-08-29 MED ORDER — HYDROMORPHONE HCL 1 MG/ML IJ SOLN
0.2500 mg | INTRAMUSCULAR | Status: DC | PRN
  Administered 2021-08-29 (×4): 0.5 mg via INTRAVENOUS

## 2021-08-29 MED ORDER — ROCURONIUM BROMIDE 10 MG/ML (PF) SYRINGE
PREFILLED_SYRINGE | INTRAVENOUS | Status: DC | PRN
  Administered 2021-08-29: 60 mg via INTRAVENOUS

## 2021-08-29 MED ORDER — CEFAZOLIN SODIUM-DEXTROSE 2-4 GM/100ML-% IV SOLN
2.0000 g | Freq: Once | INTRAVENOUS | Status: AC
  Administered 2021-08-29: 2 g via INTRAVENOUS
  Filled 2021-08-29: qty 100

## 2021-08-29 MED ORDER — ACETAMINOPHEN 10 MG/ML IV SOLN: 1000.0000 mg | Freq: Once | INTRAVENOUS | Status: DC | PRN

## 2021-08-29 MED ORDER — OXYCODONE HCL 5 MG/5ML PO SOLN: 5.0000 mg | Freq: Once | ORAL | Status: AC | PRN

## 2021-08-29 SURGICAL SUPPLY — 22 items
BAG URINE DRAIN 2000ML AR STRL (UROLOGICAL SUPPLIES) IMPLANT
BAG URO CATCHER STRL LF (MISCELLANEOUS) ×2 IMPLANT
CATH FOLEY 2WAY SLVR  5CC 18FR (CATHETERS) ×2
CATH FOLEY 2WAY SLVR 5CC 18FR (CATHETERS) ×1 IMPLANT
DRAPE FOOT SWITCH (DRAPES) ×2 IMPLANT
DRSG TELFA 3X8 NADH (GAUZE/BANDAGES/DRESSINGS) ×2 IMPLANT
ELECT REM PT RETURN 15FT ADLT (MISCELLANEOUS) ×2 IMPLANT
GLOVE SURG ENC TEXT LTX SZ7.5 (GLOVE) ×2 IMPLANT
GOWN STRL REUS W/TWL LRG LVL3 (GOWN DISPOSABLE) ×2 IMPLANT
IV NS 1000ML (IV SOLUTION) ×2
IV NS 1000ML BAXH (IV SOLUTION) ×1 IMPLANT
IV NS IRRIG 3000ML ARTHROMATIC (IV SOLUTION) ×4 IMPLANT
KIT TURNOVER KIT A (KITS) ×2 IMPLANT
LOOP CUT BIPOLAR 24F LRG (ELECTROSURGICAL) ×2 IMPLANT
MANIFOLD NEPTUNE II (INSTRUMENTS) ×2 IMPLANT
PACK CYSTO (CUSTOM PROCEDURE TRAY) ×2 IMPLANT
PENCIL SMOKE EVACUATOR (MISCELLANEOUS) IMPLANT
PLUG CATH AND CAP STER (CATHETERS) ×2 IMPLANT
SYR TOOMEY IRRIG 70ML (MISCELLANEOUS) ×2
SYRINGE TOOMEY IRRIG 70ML (MISCELLANEOUS) ×1 IMPLANT
TUBING CONNECTING 10 (TUBING) ×2 IMPLANT
TUBING UROLOGY SET (TUBING) ×2 IMPLANT

## 2021-08-29 NOTE — Anesthesia Procedure Notes (Signed)
Procedure Name: Intubation Date/Time: 08/29/2021 1:24 PM Performed by: Maxwell Caul, CRNA Pre-anesthesia Checklist: Patient identified, Emergency Drugs available, Suction available and Patient being monitored Patient Re-evaluated:Patient Re-evaluated prior to induction Oxygen Delivery Method: Circle system utilized Preoxygenation: Pre-oxygenation with 100% oxygen Induction Type: IV induction Ventilation: Mask ventilation without difficulty Laryngoscope Size: Mac and 4 Grade View: Grade I Tube type: Oral Tube size: 7.5 mm Number of attempts: 1 Airway Equipment and Method: Stylet Placement Confirmation: ETT inserted through vocal cords under direct vision, positive ETCO2 and breath sounds checked- equal and bilateral Secured at: 22 cm Tube secured with: Tape Dental Injury: Teeth and Oropharynx as per pre-operative assessment

## 2021-08-29 NOTE — Anesthesia Postprocedure Evaluation (Signed)
Anesthesia Post Note  Patient: Robert Burnett  Procedure(s) Performed: TRANSURETHRAL RESECTION OF BLADDER TUMOR (TURBT) WITH CYSTOSCOPY     Patient location during evaluation: PACU Anesthesia Type: General Level of consciousness: awake and alert Pain management: pain level controlled Vital Signs Assessment: post-procedure vital signs reviewed and stable Respiratory status: spontaneous breathing, nonlabored ventilation, respiratory function stable and patient connected to nasal cannula oxygen Cardiovascular status: blood pressure returned to baseline and stable Postop Assessment: no apparent nausea or vomiting Anesthetic complications: no   No notable events documented.  Last Vitals:  Vitals:   08/29/21 1120 08/29/21 1402  BP: 132/73 132/85  Pulse: 63   Resp: 18   Temp:  36.4 C  SpO2: 97%     Last Pain:  Vitals:   08/29/21 1402  TempSrc:   PainSc: 5                  [name]

## 2021-08-29 NOTE — Transfer of Care (Signed)
Immediate Anesthesia Transfer of Care Note  Patient: Robert Burnett  Procedure(s) Performed: TRANSURETHRAL RESECTION OF BLADDER TUMOR (TURBT) WITH CYSTOSCOPY  Patient Location: PACU  Anesthesia Type:General  Level of Consciousness: awake, alert  and oriented  Airway & Oxygen Therapy: Patient Spontanous Breathing and Patient connected to face mask oxygen  Post-op Assessment: Report given to RN and Post -op Vital signs reviewed and stable  Post vital signs: Reviewed and stable  Last Vitals:  Vitals Value Taken Time  BP    Temp    Pulse    Resp    SpO2      Last Pain:  Vitals:   08/29/21 1120  TempSrc: Oral         Complications: No notable events documented.

## 2021-08-29 NOTE — Op Note (Signed)
Preoperative diagnosis: 1.  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  Bladder tumor  Postoperative diagnosis: 1.  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  Bladder tumor  Procedures: 1.  Cystoscopy 2.  Pelvic exam under anesthesia 3.  Transurethral resection of bladder tumor (largest tumor 1.5 cm) 4.  Postoperative instillation of intravesical gemcitabine  Surgeon: Robert Curia MD  Anesthesia: General  Complications: None  EBL: Minimal  Specimens: 1.  Bladder neck tumor 2.  Posterior bladder tumors 3.  Anterior bladder tumor  Disposition of specimens: Pathology  Intraoperative findings: He was noted to have approximately 6 small papillary bladder tumors with 1 located the bladder neck, multiple tumors posteriorly, and a single tumor located anteriorly.  There were also small adjacent papillary tumors.  Indication: Robert Burnett is a 60 year old gentleman with a history of urothelial carcinoma of the left ureter status post definitive surgical therapy with a left robot-assisted laparoscopic nephroureterectomy.  On subsequent surveillance cystoscopy, he was found to have multiple papillary tumor recurrences within the bladder with locations as identified above.  He was recommended he undergo the above procedures.  The potential risks, complications, and expected recovery process was discussed in detail.  Informed consent was obtained.  Description of procedure: The patient was taken to the operating room and a general anesthetic was administered.  He was given preoperative antibiotics, placed in the dorsolithotomy position, and prepped and draped in the usual sterile fashion.  General anesthesia with paralysis was utilized.  A preoperative timeout was performed.  Cystourethroscopy was performed with a 22 French cystoscope.  Utilizing a 30 degree and 70 degree lens, systematic  examination of bladder was identified bladder tumors located at the sites noted above.  The cystoscope was then removed and replaced with a 69 French resectoscope with the visual obturator.  The working element was then inserted and cutting loop resection was performed first of the bladder neck tumor.  The specimen was removed and hemostasis was achieved.  There were multiple posterior bladder tumors noted and these were all resected and subsequently sent as 1 specific specimen.  Care was taken to resect into the detrusor muscle.  Hemostasis was achieved at the sites and these tumors were removed as a separate specimen.  Attention then turned anteriorly where a single papillary tumor measuring approximately 1.5 cm was identified.  This was then carefully resected in a similar fashion.  Hemostasis was again achieved with electrocautery.  The specimen was removed and sent also as a separate specimen.  Hemostasis was ensured.  The bladder was then emptied and the pelvic exam under anesthesia was performed.  No pelvic mass was noted.  A 16 French Foley catheter was then inserted.  The patient tolerated the procedure well without complications.  He was able to be extubated and transferred to recovery unit in satisfactory condition.  In the PACU, 2000 mg of intravesical gemcitabine was instilled into the bladder and left indwelling for 1 hour.

## 2021-08-29 NOTE — Discharge Instructions (Addendum)
You may see some blood in the urine and may have some burning with urination for 48-72 hours. You also may notice that you have to urinate more frequently or urgently after your procedure which is normal.  You should call should you develop an inability urinate, fever > 101, persistent nausea and vomiting that prevents you from eating or drinking to stay hydrated.    

## 2021-08-29 NOTE — Interval H&P Note (Signed)
History and Physical Interval Note:  08/29/2021 1:02 PM  Robert Burnett  has presented today for surgery, with the diagnosis of BLADDER CANCER.  The various methods of treatment have been discussed with the patient and family. After consideration of risks, benefits and other options for treatment, the patient has consented to  Procedure(s) with comments: TRANSURETHRAL RESECTION OF BLADDER TUMOR (TURBT) WITH CYSTOSCOPY (N/A) - GENERAL ANESTHESIA WITH PARALYSIS as a surgical intervention.  The patient's history has been reviewed, patient examined, no change in status, stable for surgery.  I have reviewed the patient's chart and labs.  Questions were answered to the patient's satisfaction.     [employer]

## 2021-08-30 LAB — SURGICAL PATHOLOGY

## 2021-08-31 ENCOUNTER — Encounter (HOSPITAL_COMMUNITY): Payer: Self-pay | Admitting: Urology

## 2021-09-02 ENCOUNTER — Ambulatory Visit: Payer: 59 | Admitting: Podiatry

## 2021-09-05 ENCOUNTER — Ambulatory Visit: Payer: 59 | Admitting: Family Medicine

## 2021-09-05 NOTE — Progress Notes (Deleted)
   I, Peterson Lombard, LAT, ATC acting as a scribe for Lynne Leader, MD.  MADIX BLOWE is a 60 y.o. male who presents to [facility] today for f/u L elbow lateral epicondylitis. Pt was last seen by Dr. Georgina Snell on 08/01/21 and advised to use a compression sleeve, nitro patches, and was taught a HEP. Today, pt reports    Pertinent review of systems: ***  Relevant historical information: ***   Exam:  There were no vitals taken for this visit. General: Well Developed, well nourished, and in no acute distress.   MSK: ***    Lab and Radiology Results No results found for this or any previous visit (from the past 72 hour(s)). No results found.     Assessment and Plan: 60 y.o. male with ***   PDMP not reviewed this encounter. No orders of the defined types were placed in this encounter.  No orders of the defined types were placed in this encounter.    Discussed warning signs or symptoms. Please see discharge instructions. Patient expresses understanding.   ***

## 2022-03-24 ENCOUNTER — Other Ambulatory Visit: Payer: Self-pay

## 2022-03-24 ENCOUNTER — Inpatient Hospital Stay (HOSPITAL_COMMUNITY)
Admission: EM | Admit: 2022-03-24 | Discharge: 2022-04-04 | DRG: 239 | Disposition: A | Discharge status: Home Health | Payer: Self-pay | Attending: Internal Medicine | Admitting: Internal Medicine

## 2022-03-24 DIAGNOSIS — K760 Fatty (change of) liver, not elsewhere classified: Secondary | ICD-10-CM | POA: Diagnosis present

## 2022-03-24 DIAGNOSIS — L02612 Cutaneous abscess of left foot: Secondary | ICD-10-CM | POA: Diagnosis present

## 2022-03-24 DIAGNOSIS — E876 Hypokalemia: Secondary | ICD-10-CM | POA: Diagnosis present

## 2022-03-24 DIAGNOSIS — M65072 Abscess of tendon sheath, left ankle and foot: Secondary | ICD-10-CM

## 2022-03-24 DIAGNOSIS — B951 Streptococcus, group B, as the cause of diseases classified elsewhere: Secondary | ICD-10-CM | POA: Diagnosis present

## 2022-03-24 DIAGNOSIS — I96 Gangrene, not elsewhere classified: Principal | ICD-10-CM | POA: Diagnosis present

## 2022-03-24 DIAGNOSIS — M24575 Contracture, left foot: Secondary | ICD-10-CM | POA: Diagnosis present

## 2022-03-24 DIAGNOSIS — Z79899 Other long term (current) drug therapy: Secondary | ICD-10-CM

## 2022-03-24 DIAGNOSIS — E1152 Type 2 diabetes mellitus with diabetic peripheral angiopathy with gangrene: Principal | ICD-10-CM | POA: Diagnosis present

## 2022-03-24 DIAGNOSIS — R21 Rash and other nonspecific skin eruption: Secondary | ICD-10-CM | POA: Diagnosis present

## 2022-03-24 DIAGNOSIS — R7881 Bacteremia: Secondary | ICD-10-CM | POA: Diagnosis present

## 2022-03-24 DIAGNOSIS — L089 Local infection of the skin and subcutaneous tissue, unspecified: Secondary | ICD-10-CM | POA: Diagnosis present

## 2022-03-24 DIAGNOSIS — I10 Essential (primary) hypertension: Secondary | ICD-10-CM | POA: Diagnosis present

## 2022-03-24 DIAGNOSIS — R81 Glycosuria: Secondary | ICD-10-CM | POA: Diagnosis present

## 2022-03-24 DIAGNOSIS — J45909 Unspecified asthma, uncomplicated: Secondary | ICD-10-CM | POA: Diagnosis present

## 2022-03-24 DIAGNOSIS — Z888 Allergy status to other drugs, medicaments and biological substances status: Secondary | ICD-10-CM

## 2022-03-24 DIAGNOSIS — Z8701 Personal history of pneumonia (recurrent): Secondary | ICD-10-CM

## 2022-03-24 DIAGNOSIS — L97529 Non-pressure chronic ulcer of other part of left foot with unspecified severity: Secondary | ICD-10-CM | POA: Diagnosis present

## 2022-03-24 DIAGNOSIS — Z6823 Body mass index (BMI) 23.0-23.9, adult: Secondary | ICD-10-CM

## 2022-03-24 DIAGNOSIS — E11628 Type 2 diabetes mellitus with other skin complications: Secondary | ICD-10-CM | POA: Diagnosis present

## 2022-03-24 DIAGNOSIS — E43 Unspecified severe protein-calorie malnutrition: Secondary | ICD-10-CM | POA: Diagnosis present

## 2022-03-24 DIAGNOSIS — Z89422 Acquired absence of other left toe(s): Secondary | ICD-10-CM

## 2022-03-24 DIAGNOSIS — Z8550 Personal history of malignant neoplasm of unspecified urinary tract organ: Secondary | ICD-10-CM

## 2022-03-24 DIAGNOSIS — K219 Gastro-esophageal reflux disease without esophagitis: Secondary | ICD-10-CM | POA: Diagnosis present

## 2022-03-24 DIAGNOSIS — T383X6A Underdosing of insulin and oral hypoglycemic [antidiabetic] drugs, initial encounter: Secondary | ICD-10-CM | POA: Diagnosis present

## 2022-03-24 DIAGNOSIS — E1142 Type 2 diabetes mellitus with diabetic polyneuropathy: Secondary | ICD-10-CM | POA: Diagnosis present

## 2022-03-24 DIAGNOSIS — Z7984 Long term (current) use of oral hypoglycemic drugs: Secondary | ICD-10-CM

## 2022-03-24 DIAGNOSIS — M869 Osteomyelitis, unspecified: Secondary | ICD-10-CM | POA: Diagnosis present

## 2022-03-24 DIAGNOSIS — E1165 Type 2 diabetes mellitus with hyperglycemia: Secondary | ICD-10-CM | POA: Diagnosis present

## 2022-03-24 DIAGNOSIS — M7989 Other specified soft tissue disorders: Secondary | ICD-10-CM | POA: Diagnosis present

## 2022-03-24 DIAGNOSIS — M4714 Other spondylosis with myelopathy, thoracic region: Secondary | ICD-10-CM | POA: Diagnosis present

## 2022-03-24 DIAGNOSIS — R739 Hyperglycemia, unspecified: Secondary | ICD-10-CM

## 2022-03-24 DIAGNOSIS — F17290 Nicotine dependence, other tobacco product, uncomplicated: Secondary | ICD-10-CM | POA: Diagnosis present

## 2022-03-24 DIAGNOSIS — R824 Acetonuria: Secondary | ICD-10-CM | POA: Diagnosis present

## 2022-03-24 DIAGNOSIS — Z833 Family history of diabetes mellitus: Secondary | ICD-10-CM

## 2022-03-24 DIAGNOSIS — M868X7 Other osteomyelitis, ankle and foot: Secondary | ICD-10-CM

## 2022-03-24 DIAGNOSIS — E785 Hyperlipidemia, unspecified: Secondary | ICD-10-CM | POA: Diagnosis present

## 2022-03-24 DIAGNOSIS — Z8249 Family history of ischemic heart disease and other diseases of the circulatory system: Secondary | ICD-10-CM

## 2022-03-24 NOTE — ED Triage Notes (Signed)
Pt BIB EMS with reports of a left foot wound x 1 week. Pt's cbg [phone].

## 2022-03-25 ENCOUNTER — Inpatient Hospital Stay (HOSPITAL_COMMUNITY): Payer: Self-pay | Admitting: Certified Registered Nurse Anesthetist

## 2022-03-25 ENCOUNTER — Inpatient Hospital Stay (HOSPITAL_COMMUNITY): Payer: Self-pay

## 2022-03-25 ENCOUNTER — Other Ambulatory Visit: Payer: Self-pay

## 2022-03-25 ENCOUNTER — Encounter (HOSPITAL_COMMUNITY): Payer: Self-pay | Admitting: Certified Registered Nurse Anesthetist

## 2022-03-25 ENCOUNTER — Inpatient Hospital Stay (HOSPITAL_COMMUNITY): Payer: Medicaid Other

## 2022-03-25 ENCOUNTER — Encounter (HOSPITAL_COMMUNITY): Payer: Self-pay | Admitting: Internal Medicine

## 2022-03-25 ENCOUNTER — Encounter (HOSPITAL_COMMUNITY)
Admission: EM | Disposition: A | Discharge status: Home Health | Payer: Self-pay | Source: Home / Self Care | Attending: Internal Medicine

## 2022-03-25 ENCOUNTER — Inpatient Hospital Stay (HOSPITAL_COMMUNITY): Payer: Medicaid Other | Admitting: Certified Registered Nurse Anesthetist

## 2022-03-25 ENCOUNTER — Emergency Department (HOSPITAL_COMMUNITY): Payer: Medicaid Other

## 2022-03-25 DIAGNOSIS — I96 Gangrene, not elsewhere classified: Secondary | ICD-10-CM | POA: Diagnosis present

## 2022-03-25 DIAGNOSIS — E876 Hypokalemia: Secondary | ICD-10-CM | POA: Diagnosis present

## 2022-03-25 DIAGNOSIS — E43 Unspecified severe protein-calorie malnutrition: Secondary | ICD-10-CM | POA: Diagnosis present

## 2022-03-25 DIAGNOSIS — L0889 Other specified local infections of the skin and subcutaneous tissue: Secondary | ICD-10-CM

## 2022-03-25 DIAGNOSIS — E1169 Type 2 diabetes mellitus with other specified complication: Secondary | ICD-10-CM

## 2022-03-25 DIAGNOSIS — E1142 Type 2 diabetes mellitus with diabetic polyneuropathy: Secondary | ICD-10-CM

## 2022-03-25 DIAGNOSIS — M869 Osteomyelitis, unspecified: Secondary | ICD-10-CM

## 2022-03-25 DIAGNOSIS — Z7984 Long term (current) use of oral hypoglycemic drugs: Secondary | ICD-10-CM

## 2022-03-25 DIAGNOSIS — M7989 Other specified soft tissue disorders: Secondary | ICD-10-CM | POA: Diagnosis present

## 2022-03-25 DIAGNOSIS — L02611 Cutaneous abscess of right foot: Secondary | ICD-10-CM

## 2022-03-25 DIAGNOSIS — L0390 Cellulitis, unspecified: Secondary | ICD-10-CM

## 2022-03-25 DIAGNOSIS — E1152 Type 2 diabetes mellitus with diabetic peripheral angiopathy with gangrene: Secondary | ICD-10-CM

## 2022-03-25 DIAGNOSIS — M65072 Abscess of tendon sheath, left ankle and foot: Secondary | ICD-10-CM

## 2022-03-25 HISTORY — PX: APPLICATION OF WOUND VAC: SHX5189

## 2022-03-25 HISTORY — PX: AMPUTATION: SHX166

## 2022-03-25 LAB — BLOOD CULTURE ID PANEL (REFLEXED) - BCID2

## 2022-03-25 LAB — COMPREHENSIVE METABOLIC PANEL
ALT: 29 U/L (ref 0–44)
ALT: 22 U/L (ref 0–44)
AST: 27 U/L (ref 15–41)
AST: 19 U/L (ref 15–41)
Albumin: 2.7 g/dL — ABNORMAL LOW (ref 3.5–5.0)
Albumin: 2.3 g/dL — ABNORMAL LOW (ref 3.5–5.0)
Alkaline Phosphatase: 100 U/L (ref 38–126)
Alkaline Phosphatase: 71 U/L (ref 38–126)
Anion gap: 16 — ABNORMAL HIGH (ref 5–15)
Anion gap: 13 (ref 5–15)
BUN: 51 mg/dL — ABNORMAL HIGH (ref 8–23)
BUN: 44 mg/dL — ABNORMAL HIGH (ref 8–23)
CO2: 21 mmol/L — ABNORMAL LOW (ref 22–32)
CO2: 22 mmol/L (ref 22–32)
Calcium: 9.1 mg/dL (ref 8.9–10.3)
Calcium: 8.9 mg/dL (ref 8.9–10.3)
Chloride: 84 mmol/L — ABNORMAL LOW (ref 98–111)
Chloride: 96 mmol/L — ABNORMAL LOW (ref 98–111)
Creatinine, Ser: 1.48 mg/dL — ABNORMAL HIGH (ref 0.61–1.24)
Creatinine, Ser: 1.21 mg/dL (ref 0.61–1.24)
GFR, Estimated: 53 mL/min — ABNORMAL LOW (ref >60)
GFR, Estimated: >60 mL/min (ref >60)
Glucose, Bld: 646 mg/dL (ref 70–99)
Glucose, Bld: 252 mg/dL — ABNORMAL HIGH (ref 70–99)
Potassium: 4.2 mmol/L (ref 3.5–5.1)
Potassium: 3.1 mmol/L — ABNORMAL LOW (ref 3.5–5.1)
Sodium: 121 mmol/L — ABNORMAL LOW (ref 135–145)
Sodium: 131 mmol/L — ABNORMAL LOW (ref 135–145)
Total Bilirubin: 0.9 mg/dL (ref 0.3–1.2)
Total Bilirubin: 0.8 mg/dL (ref 0.3–1.2)
Total Protein: 8.2 g/dL — ABNORMAL HIGH (ref 6.5–8.1)
Total Protein: 6.8 g/dL (ref 6.5–8.1)

## 2022-03-25 LAB — URINALYSIS, ROUTINE W REFLEX MICROSCOPIC
Bacteria, UA: NONE SEEN
Bilirubin Urine: NEGATIVE
Glucose, UA: >=500 mg/dL — AB
Hgb urine dipstick: NEGATIVE
Ketones, ur: 20 mg/dL — AB
Leukocytes,Ua: NEGATIVE
Nitrite: NEGATIVE
Protein, ur: NEGATIVE mg/dL
Specific Gravity, Urine: 1.022 (ref 1.005–1.030)
pH: 5 (ref 5.0–8.0)

## 2022-03-25 LAB — PREALBUMIN: Prealbumin: <5 mg/dL — ABNORMAL LOW (ref 18–38)

## 2022-03-25 LAB — CBC WITH DIFFERENTIAL/PLATELET
Abs Immature Granulocytes: 0.07 10*3/uL (ref 0.00–0.07)
Abs Immature Granulocytes: 0.12 10*3/uL — ABNORMAL HIGH (ref 0.00–0.07)
Basophils Absolute: 0 10*3/uL (ref 0.0–0.1)
Basophils Absolute: 0 10*3/uL (ref 0.0–0.1)
Basophils Relative: 0 %
Basophils Relative: 0 %
Eosinophils Absolute: 0 10*3/uL (ref 0.0–0.5)
Eosinophils Absolute: 0 10*3/uL (ref 0.0–0.5)
Eosinophils Relative: 0 %
Eosinophils Relative: 0 %
HCT: 34.8 % — ABNORMAL LOW (ref 39.0–52.0)
HCT: 35.4 % — ABNORMAL LOW (ref 39.0–52.0)
Hemoglobin: 11.9 g/dL — ABNORMAL LOW (ref 13.0–17.0)
Hemoglobin: 12.1 g/dL — ABNORMAL LOW (ref 13.0–17.0)
Immature Granulocytes: 1 %
Immature Granulocytes: 1 %
Lymphocytes Relative: 6 %
Lymphocytes Relative: 8 %
Lymphs Abs: 0.7 10*3/uL (ref 0.7–4.0)
Lymphs Abs: 0.9 10*3/uL (ref 0.7–4.0)
MCH: 31.1 pg (ref 26.0–34.0)
MCH: 31.3 pg (ref 26.0–34.0)
MCHC: 33.6 g/dL (ref 30.0–36.0)
MCHC: 34.8 g/dL (ref 30.0–36.0)
MCV: 89.5 fL (ref 80.0–100.0)
MCV: 93.2 fL (ref 80.0–100.0)
Monocytes Absolute: 0.8 10*3/uL (ref 0.1–1.0)
Monocytes Absolute: 0.9 10*3/uL (ref 0.1–1.0)
Monocytes Relative: 7 %
Monocytes Relative: 8 %
Neutro Abs: 10 10*3/uL — ABNORMAL HIGH (ref 1.7–7.7)
Neutro Abs: 9.6 10*3/uL — ABNORMAL HIGH (ref 1.7–7.7)
Neutrophils Relative %: 83 %
Neutrophils Relative %: 86 %
Platelets: 231 10*3/uL (ref 150–400)
Platelets: 259 10*3/uL (ref 150–400)
RBC: 3.8 MIL/uL — ABNORMAL LOW (ref 4.22–5.81)
RBC: 3.89 MIL/uL — ABNORMAL LOW (ref 4.22–5.81)
RDW: 12.1 % (ref 11.5–15.5)
RDW: 12.2 % (ref 11.5–15.5)
WBC: 11.2 10*3/uL — ABNORMAL HIGH (ref 4.0–10.5)
WBC: 11.9 10*3/uL — ABNORMAL HIGH (ref 4.0–10.5)
nRBC: 0 % (ref 0.0–0.2)
nRBC: 0 % (ref 0.0–0.2)

## 2022-03-25 LAB — GLUCOSE, CAPILLARY
Glucose-Capillary: 246 mg/dL — ABNORMAL HIGH (ref 70–99)
Glucose-Capillary: 243 mg/dL — ABNORMAL HIGH (ref 70–99)
Glucose-Capillary: 235 mg/dL — ABNORMAL HIGH (ref 70–99)
Glucose-Capillary: 256 mg/dL — ABNORMAL HIGH (ref 70–99)
Glucose-Capillary: 227 mg/dL — ABNORMAL HIGH (ref 70–99)
Glucose-Capillary: 275 mg/dL — ABNORMAL HIGH (ref 70–99)
Glucose-Capillary: 258 mg/dL — ABNORMAL HIGH (ref 70–99)
Glucose-Capillary: 239 mg/dL — ABNORMAL HIGH (ref 70–99)
Glucose-Capillary: 255 mg/dL — ABNORMAL HIGH (ref 70–99)
Glucose-Capillary: 225 mg/dL — ABNORMAL HIGH (ref 70–99)
Glucose-Capillary: 271 mg/dL — ABNORMAL HIGH (ref 70–99)
Glucose-Capillary: 265 mg/dL — ABNORMAL HIGH (ref 70–99)
Glucose-Capillary: 261 mg/dL — ABNORMAL HIGH (ref 70–99)
Glucose-Capillary: 230 mg/dL — ABNORMAL HIGH (ref 70–99)
Glucose-Capillary: 230 mg/dL — ABNORMAL HIGH (ref 70–99)
Glucose-Capillary: 202 mg/dL — ABNORMAL HIGH (ref 70–99)
Glucose-Capillary: 252 mg/dL — ABNORMAL HIGH (ref 70–99)

## 2022-03-25 LAB — BASIC METABOLIC PANEL
Anion gap: 10 (ref 5–15)
BUN: 37 mg/dL — ABNORMAL HIGH (ref 8–23)
CO2: 24 mmol/L (ref 22–32)
Calcium: 8.9 mg/dL (ref 8.9–10.3)
Chloride: 99 mmol/L (ref 98–111)
Creatinine, Ser: 1.05 mg/dL (ref 0.61–1.24)
GFR, Estimated: >60 mL/min (ref >60)
Glucose, Bld: 385 mg/dL — ABNORMAL HIGH (ref 70–99)
Potassium: 3.1 mmol/L — ABNORMAL LOW (ref 3.5–5.1)
Sodium: 133 mmol/L — ABNORMAL LOW (ref 135–145)

## 2022-03-25 LAB — CBG MONITORING, ED
Glucose-Capillary: 229 mg/dL — ABNORMAL HIGH (ref 70–99)
Glucose-Capillary: 293 mg/dL — ABNORMAL HIGH (ref 70–99)
Glucose-Capillary: 409 mg/dL — ABNORMAL HIGH (ref 70–99)
Glucose-Capillary: >600 mg/dL (ref 70–99)
Glucose-Capillary: >600 mg/dL (ref 70–99)

## 2022-03-25 LAB — I-STAT CHEM 8, ED
BUN: 42 mg/dL — ABNORMAL HIGH (ref 8–23)
Calcium, Ion: 1.17 mmol/L (ref 1.15–1.40)
Chloride: 89 mmol/L — ABNORMAL LOW (ref 98–111)
Creatinine, Ser: 1.3 mg/dL — ABNORMAL HIGH (ref 0.61–1.24)
Glucose, Bld: 645 mg/dL (ref 70–99)
HCT: 37 % — ABNORMAL LOW (ref 39.0–52.0)
Hemoglobin: 12.6 g/dL — ABNORMAL LOW (ref 13.0–17.0)
Potassium: 4.4 mmol/L (ref 3.5–5.1)
Sodium: 123 mmol/L — ABNORMAL LOW (ref 135–145)
TCO2: 21 mmol/L — ABNORMAL LOW (ref 22–32)

## 2022-03-25 LAB — MAGNESIUM: Magnesium: 1.8 mg/dL (ref 1.7–2.4)

## 2022-03-25 LAB — BETA-HYDROXYBUTYRIC ACID
Beta-Hydroxybutyric Acid: 0.26 mmol/L (ref 0.05–0.27)
Beta-Hydroxybutyric Acid: 0.22 mmol/L (ref 0.05–0.27)

## 2022-03-25 LAB — BLOOD GAS, VENOUS
Acid-base deficit: 4.8 mmol/L — ABNORMAL HIGH (ref 0.0–2.0)
Bicarbonate: 21.6 mmol/L (ref 20.0–28.0)
O2 Saturation: 32.2 %
Patient temperature: 36.9
pCO2, Ven: 45 mmHg (ref 44–60)
pH, Ven: 7.29 (ref 7.25–7.43)
pO2, Ven: <31 mmHg — CL (ref 32–45)

## 2022-03-25 LAB — SEDIMENTATION RATE: Sed Rate: 115 mm/hr — ABNORMAL HIGH (ref 0–16)

## 2022-03-25 LAB — C-REACTIVE PROTEIN: CRP: 21.4 mg/dL — ABNORMAL HIGH (ref <1.0)

## 2022-03-25 LAB — HEMOGLOBIN A1C
Hgb A1c MFr Bld: 14.2 % — ABNORMAL HIGH (ref 4.8–5.6)
Mean Plasma Glucose: 360.84 mg/dL

## 2022-03-25 LAB — MRSA NEXT GEN BY PCR, NASAL: MRSA by PCR Next Gen: DETECTED — AB

## 2022-03-25 LAB — LACTIC ACID, PLASMA: Lactic Acid, Venous: 1.6 mmol/L (ref 0.5–1.9)

## 2022-03-25 SURGERY — AMPUTATION DIGIT
Anesthesia: Choice | Laterality: Left

## 2022-03-25 SURGERY — AMPUTATION DIGIT
Anesthesia: Monitor Anesthesia Care | Site: Foot | Laterality: Left

## 2022-03-25 MED ORDER — FINASTERIDE 5 MG PO TABS
5.0000 mg | ORAL_TABLET | Freq: Every day | ORAL | Status: DC
  Administered 2022-03-26 – 2022-04-04 (×10): 5 mg via ORAL
  Filled 2022-03-25 (×10): qty 1

## 2022-03-25 MED ORDER — HYDROMORPHONE HCL 1 MG/ML IJ SOLN
0.5000 mg | Freq: Once | INTRAMUSCULAR | Status: AC
  Administered 2022-03-25: 0.5 mg via INTRAVENOUS
  Filled 2022-03-25: qty 1

## 2022-03-25 MED ORDER — METRONIDAZOLE 500 MG/100ML IV SOLN
500.0000 mg | Freq: Two times a day (BID) | INTRAVENOUS | Status: DC
  Administered 2022-03-25 (×2): 500 mg via INTRAVENOUS
  Filled 2022-03-25 (×2): qty 100

## 2022-03-25 MED ORDER — MIDAZOLAM HCL 5 MG/5ML IJ SOLN
INTRAMUSCULAR | Status: DC | PRN
  Administered 2022-03-25: 2 mg via INTRAVENOUS

## 2022-03-25 MED ORDER — ACETAMINOPHEN 10 MG/ML IV SOLN: 1000.0000 mg | Freq: Once | INTRAVENOUS | Status: DC | PRN

## 2022-03-25 MED ORDER — FENTANYL CITRATE (PF) 250 MCG/5ML IJ SOLN
INTRAMUSCULAR | Status: AC
  Filled 2022-03-25: qty 5

## 2022-03-25 MED ORDER — LINEZOLID 600 MG/300ML IV SOLN
600.0000 mg | Freq: Two times a day (BID) | INTRAVENOUS | Status: DC
  Administered 2022-03-25 – 2022-03-27 (×4): 600 mg via INTRAVENOUS
  Filled 2022-03-25 (×5): qty 300

## 2022-03-25 MED ORDER — LACTATED RINGERS IV SOLN: INTRAVENOUS | Status: DC

## 2022-03-25 MED ORDER — 0.9 % SODIUM CHLORIDE (POUR BTL) OPTIME
TOPICAL | Status: DC | PRN
  Administered 2022-03-25: 1000 mL

## 2022-03-25 MED ORDER — ATORVASTATIN CALCIUM 40 MG PO TABS
80.0000 mg | ORAL_TABLET | Freq: Every day | ORAL | Status: DC
  Administered 2022-03-26 – 2022-04-04 (×10): 80 mg via ORAL
  Filled 2022-03-25 (×10): qty 2

## 2022-03-25 MED ORDER — CHLORHEXIDINE GLUCONATE CLOTH 2 % EX PADS
6.0000 | MEDICATED_PAD | Freq: Every day | CUTANEOUS | Status: DC
  Administered 2022-03-25 – 2022-03-26 (×3): 6 via TOPICAL

## 2022-03-25 MED ORDER — ACETAMINOPHEN 325 MG PO TABS
650.0000 mg | ORAL_TABLET | Freq: Four times a day (QID) | ORAL | Status: DC | PRN
  Administered 2022-04-03: 650 mg via ORAL
  Filled 2022-03-25: qty 2

## 2022-03-25 MED ORDER — LORAZEPAM 2 MG/ML IJ SOLN
0.7500 mg | Freq: Once | INTRAMUSCULAR | Status: AC | PRN
  Administered 2022-03-25: 0.75 mg via INTRAVENOUS
  Filled 2022-03-25: qty 1

## 2022-03-25 MED ORDER — PHENYLEPHRINE HCL (PRESSORS) 10 MG/ML IV SOLN
INTRAVENOUS | Status: DC | PRN
  Administered 2022-03-25 (×3): 160 ug via INTRAVENOUS

## 2022-03-25 MED ORDER — PROPOFOL 10 MG/ML IV BOLUS
INTRAVENOUS | Status: AC
  Filled 2022-03-25: qty 20

## 2022-03-25 MED ORDER — SODIUM CHLORIDE 0.9 % IV SOLN
2.0000 g | Freq: Once | INTRAVENOUS | Status: AC
  Administered 2022-03-25: 2 g via INTRAVENOUS
  Filled 2022-03-25: qty 12.5

## 2022-03-25 MED ORDER — DEXTROSE 50 % IV SOLN: 0.0000 mL | INTRAVENOUS | Status: DC | PRN

## 2022-03-25 MED ORDER — FENTANYL CITRATE (PF) 100 MCG/2ML IJ SOLN: 25.0000 ug | INTRAMUSCULAR | Status: DC | PRN

## 2022-03-25 MED ORDER — INSULIN REGULAR(HUMAN) IN NACL 100-0.9 UT/100ML-% IV SOLN
INTRAVENOUS | Status: DC
  Administered 2022-03-25: 15 [IU]/h via INTRAVENOUS
  Administered 2022-03-25: 4.2 [IU]/h via INTRAVENOUS
  Administered 2022-03-26: 4.6 [IU]/h via INTRAVENOUS
  Filled 2022-03-25 (×3): qty 100

## 2022-03-25 MED ORDER — ATENOLOL 50 MG PO TABS
100.0000 mg | ORAL_TABLET | Freq: Every day | ORAL | Status: DC
  Administered 2022-03-27 – 2022-04-04 (×9): 100 mg via ORAL
  Filled 2022-03-25 (×9): qty 2

## 2022-03-25 MED ORDER — SODIUM CHLORIDE 0.9 % IV SOLN
2.0000 g | Freq: Three times a day (TID) | INTRAVENOUS | Status: DC
  Administered 2022-03-25: 2 g via INTRAVENOUS
  Filled 2022-03-25: qty 12.5

## 2022-03-25 MED ORDER — SODIUM CHLORIDE 0.9 % IV SOLN: INTRAVENOUS | Status: DC | PRN

## 2022-03-25 MED ORDER — ORAL CARE MOUTH RINSE
15.0000 mL | Freq: Two times a day (BID) | OROMUCOSAL | Status: DC
  Administered 2022-03-25 – 2022-03-26 (×2): 15 mL via OROMUCOSAL

## 2022-03-25 MED ORDER — MIDAZOLAM HCL 2 MG/2ML IJ SOLN
INTRAMUSCULAR | Status: AC
  Filled 2022-03-25: qty 2

## 2022-03-25 MED ORDER — PROCHLORPERAZINE EDISYLATE 10 MG/2ML IJ SOLN: 10.0000 mg | Freq: Four times a day (QID) | INTRAMUSCULAR | Status: DC | PRN

## 2022-03-25 MED ORDER — PROPOFOL 500 MG/50ML IV EMUL
INTRAVENOUS | Status: DC | PRN
  Administered 2022-03-25: 75 ug/kg/min via INTRAVENOUS

## 2022-03-25 MED ORDER — BUPIVACAINE HCL (PF) 0.5 % IJ SOLN
INTRAMUSCULAR | Status: AC
  Filled 2022-03-25: qty 30

## 2022-03-25 MED ORDER — MAGNESIUM SULFATE 2 GM/50ML IV SOLN
2.0000 g | Freq: Once | INTRAVENOUS | Status: AC
  Administered 2022-03-25: 2 g via INTRAVENOUS
  Filled 2022-03-25: qty 50

## 2022-03-25 MED ORDER — MUPIROCIN 2 % EX OINT
TOPICAL_OINTMENT | Freq: Two times a day (BID) | CUTANEOUS | Status: DC
  Administered 2022-03-25 – 2022-03-27 (×3): 1 via NASAL
  Filled 2022-03-25 (×4): qty 22

## 2022-03-25 MED ORDER — HYDROMORPHONE HCL 1 MG/ML IJ SOLN
1.0000 mg | INTRAMUSCULAR | Status: DC | PRN
  Administered 2022-03-25 – 2022-04-02 (×21): 1 mg via INTRAVENOUS
  Filled 2022-03-25 (×21): qty 1

## 2022-03-25 MED ORDER — NALOXONE HCL 0.4 MG/ML IJ SOLN: 0.4000 mg | INTRAMUSCULAR | Status: DC | PRN

## 2022-03-25 MED ORDER — PHENYLEPHRINE 80 MCG/ML (10ML) SYRINGE FOR IV PUSH (FOR BLOOD PRESSURE SUPPORT)
PREFILLED_SYRINGE | INTRAVENOUS | Status: AC
Start: 2022-03-25 — End: ?
  Filled 2022-03-25: qty 20

## 2022-03-25 MED ORDER — POTASSIUM CHLORIDE 10 MEQ/100ML IV SOLN
10.0000 meq | INTRAVENOUS | Status: AC
  Administered 2022-03-25 (×3): 10 meq via INTRAVENOUS
  Filled 2022-03-25 (×3): qty 100

## 2022-03-25 MED ORDER — FENTANYL CITRATE (PF) 100 MCG/2ML IJ SOLN
INTRAMUSCULAR | Status: DC | PRN
  Administered 2022-03-25 (×2): 50 ug via INTRAVENOUS

## 2022-03-25 MED ORDER — VANCOMYCIN HCL 2000 MG/400ML IV SOLN
2000.0000 mg | Freq: Once | INTRAVENOUS | Status: AC
  Administered 2022-03-25: 2000 mg via INTRAVENOUS
  Filled 2022-03-25: qty 400

## 2022-03-25 MED ORDER — DEXTROSE IN LACTATED RINGERS 5 % IV SOLN: INTRAVENOUS | Status: DC

## 2022-03-25 MED ORDER — PIPERACILLIN-TAZOBACTAM 3.375 G IVPB
3.3750 g | Freq: Three times a day (TID) | INTRAVENOUS | Status: DC
  Administered 2022-03-25 – 2022-03-27 (×6): 3.375 g via INTRAVENOUS
  Filled 2022-03-25 (×7): qty 50

## 2022-03-25 MED ORDER — SODIUM CHLORIDE (PF) 0.9 % IJ SOLN
500.0000 mL | Freq: Once | INTRAMUSCULAR | Status: DC
  Filled 2022-03-25: qty 500

## 2022-03-25 MED ORDER — VANCOMYCIN HCL 1000 MG IV SOLR
INTRAVENOUS | Status: DC | PRN
  Administered 2022-03-25: 1000 mg via TOPICAL

## 2022-03-25 MED ORDER — ONDANSETRON HCL 4 MG/2ML IJ SOLN
4.0000 mg | Freq: Once | INTRAMUSCULAR | Status: AC
  Administered 2022-03-25: 4 mg via INTRAVENOUS
  Filled 2022-03-25: qty 2

## 2022-03-25 MED ORDER — PHENYLEPHRINE 80 MCG/ML (10ML) SYRINGE FOR IV PUSH (FOR BLOOD PRESSURE SUPPORT)
PREFILLED_SYRINGE | INTRAVENOUS | Status: AC
  Filled 2022-03-25: qty 40

## 2022-03-25 MED ORDER — LACTATED RINGERS IV BOLUS
1000.0000 mL | Freq: Once | INTRAVENOUS | Status: AC
  Administered 2022-03-25: 1000 mL via INTRAVENOUS

## 2022-03-25 MED ORDER — ACETAMINOPHEN 650 MG RE SUPP: 650.0000 mg | Freq: Four times a day (QID) | RECTAL | Status: DC | PRN

## 2022-03-25 MED ORDER — SODIUM CHLORIDE 0.9 % IV BOLUS: 500.0000 mL | Freq: Once | INTRAVENOUS | Status: DC

## 2022-03-25 MED ORDER — BUPIVACAINE HCL (PF) 0.5 % IJ SOLN
INTRAMUSCULAR | Status: DC | PRN
  Administered 2022-03-25: 20 mL

## 2022-03-25 MED ORDER — VANCOMYCIN HCL IN DEXTROSE 1-5 GM/200ML-% IV SOLN: 1000.0000 mg | Freq: Two times a day (BID) | INTRAVENOUS | Status: DC

## 2022-03-25 MED ORDER — VANCOMYCIN HCL 1000 MG IV SOLR
INTRAVENOUS | Status: AC
  Filled 2022-03-25: qty 20

## 2022-03-25 MED ORDER — MUPIROCIN 2 % EX OINT: TOPICAL_OINTMENT | Freq: Two times a day (BID) | CUTANEOUS | Status: DC

## 2022-03-25 MED ORDER — FENTANYL CITRATE PF 50 MCG/ML IJ SOSY: 25.0000 ug | PREFILLED_SYRINGE | INTRAMUSCULAR | Status: DC | PRN

## 2022-03-25 MED ORDER — FENTANYL 75 MCG/HR TD PT72
1.0000 | MEDICATED_PATCH | TRANSDERMAL | Status: DC
  Administered 2022-03-27 – 2022-04-02 (×3): 1 via TRANSDERMAL
  Filled 2022-03-25 (×3): qty 1

## 2022-03-25 MED ORDER — FENTANYL CITRATE PF 50 MCG/ML IJ SOSY
50.0000 ug | PREFILLED_SYRINGE | INTRAMUSCULAR | Status: DC | PRN
  Administered 2022-03-25: 50 ug via INTRAVENOUS
  Filled 2022-03-25: qty 1

## 2022-03-25 SURGICAL SUPPLY — 43 items
BAG COUNTER SPONGE SURGICOUNT (BAG) IMPLANT
BLADE SURG 15 STRL LF DISP TIS (BLADE) IMPLANT
BLADE SURG 15 STRL SS (BLADE) ×4
BNDG CONFORM 4 STRL LF (GAUZE/BANDAGES/DRESSINGS) ×1 IMPLANT
BNDG ELASTIC 4X5.8 VLCR STR LF (GAUZE/BANDAGES/DRESSINGS) ×1 IMPLANT
BNDG ELASTIC 6X5.8 VLCR STR LF (GAUZE/BANDAGES/DRESSINGS) ×1 IMPLANT
CLEANER TIP ELECTROSURG 2X2 (MISCELLANEOUS) IMPLANT
CNTNR URN SCR LID CUP LEK RST (MISCELLANEOUS) IMPLANT
CONT SPEC 4OZ STRL OR WHT (MISCELLANEOUS) ×2
COVER SURGICAL LIGHT HANDLE (MISCELLANEOUS) ×2 IMPLANT
CUFF TOURN SGL QUICK 18 (TOURNIQUET CUFF) ×1 IMPLANT
CUFF TOURN SGL QUICK 24 (TOURNIQUET CUFF)
CUFF TRNQT CYL 24X4X16.5-23 (TOURNIQUET CUFF) IMPLANT
DRSG VAC ATS SM SENSATRAC (GAUZE/BANDAGES/DRESSINGS) ×1 IMPLANT
GAUZE SPONGE 4X4 12PLY STRL (GAUZE/BANDAGES/DRESSINGS) ×2 IMPLANT
GAUZE XEROFORM 1X8 LF (GAUZE/BANDAGES/DRESSINGS) ×1 IMPLANT
GLOVE BIO SURGEON STRL SZ7.5 (GLOVE) ×2 IMPLANT
GLOVE BIOGEL PI IND STRL 7.5 (GLOVE) ×1 IMPLANT
GLOVE BIOGEL PI IND STRL 8 (GLOVE) ×1 IMPLANT
GLOVE BIOGEL PI INDICATOR 7.5 (GLOVE) ×1
GLOVE BIOGEL PI INDICATOR 8 (GLOVE) ×1
GLOVE ECLIPSE 8.0 STRL XLNG CF (GLOVE) ×2 IMPLANT
GLOVE SURG ENC TEXT LTX SZ7 (GLOVE) ×2 IMPLANT
GOWN STRL REUS W/ TWL XL LVL3 (GOWN DISPOSABLE) ×1 IMPLANT
GOWN STRL REUS W/TWL XL LVL3 (GOWN DISPOSABLE) ×2
KIT BASIN OR (CUSTOM PROCEDURE TRAY) ×2 IMPLANT
NDL HYPO 25X1 1.5 SAFETY (NEEDLE) ×1 IMPLANT
NEEDLE HYPO 25X1 1.5 SAFETY (NEEDLE) ×2 IMPLANT
PACK ORTHO EXTREMITY (CUSTOM PROCEDURE TRAY) ×2 IMPLANT
PADDING UNDERCAST 2 STRL (CAST SUPPLIES) ×1
PADDING UNDERCAST 2X4 STRL (CAST SUPPLIES) ×1 IMPLANT
PENCIL SMOKE EVACUATOR (MISCELLANEOUS) ×2 IMPLANT
SET INTERPULSE LAVAGE W/TIP (ORTHOPEDIC DISPOSABLE SUPPLIES) ×1 IMPLANT
SPIKE FLUID TRANSFER (MISCELLANEOUS) IMPLANT
STAPLER VISISTAT 35W (STAPLE) ×1 IMPLANT
SUT ETHILON 2 0 PS N (SUTURE) ×3 IMPLANT
SUT ETHILON 4 0 PS 2 18 (SUTURE) ×1 IMPLANT
SUT VIC AB 4-0 PS2 27 (SUTURE) ×1 IMPLANT
SWAB CULTURE ESWAB REG 1ML (MISCELLANEOUS) ×2 IMPLANT
SYR 20ML LL LF (SYRINGE) ×1 IMPLANT
TOWEL OR 17X26 10 PK STRL BLUE (TOWEL DISPOSABLE) ×2 IMPLANT
TOWEL OR NON WOVEN STRL DISP B (DISPOSABLE) ×2 IMPLANT
UNDERPAD 30X36 HEAVY ABSORB (UNDERPADS AND DIAPERS) ×3 IMPLANT

## 2022-03-25 NOTE — Progress Notes (Signed)
Per MD order, Robert Burnett, Fentanyl patches removed. Three located on patients abdomen upon admission to ICU, 1 on right side and two on left side. IV Fentanyl PRN ordered to cover pain per MD.

## 2022-03-25 NOTE — Brief Op Note (Signed)
03/25/2022  8:22 PM  PATIENT:  Robert Burnett  61 y.o. male  PRE-OPERATIVE DIAGNOSIS:  diabetic foot infection  POST-OPERATIVE DIAGNOSIS:  diabetic foot infection  PROCEDURE:  Procedure(s): AMPUTATION DIGITS,TRANSMETARSAL AMPUTATION, REMOVAL OF TOES AND BONE BEHIND TOES  (Left) APPLICATION OF WOUND VAC (Left)  SURGEON:  Surgeon(s) and Role:    * Estera Ozier, Stephan Minister, DPM - Primary    ASSISTANTS: none   ANESTHESIA:   MAC and ankle block  EBL:  10 mL   BLOOD ADMINISTERED:none  DRAINS:  Wound VAC    LOCAL MEDICATIONS USED:  MARCAINE    and Amount: 20 ml  SPECIMEN: Bone and soft tissue of left forefoot, deep tissue culture  DISPOSITION OF SPECIMEN:   Micro and pathology  COUNTS:  YES  TOURNIQUET:   Total Tourniquet Time Documented: Calf (Left) - 22 minutes Total: Calf (Left) - 22 minutes   DICTATION: .Note written in EPIC  PLAN OF CARE: Admit to inpatient   PATIENT DISPOSITION:  PACU - hemodynamically stable.   Delay start of Pharmacological VTE agent (>24hrs) due to surgical blood loss or risk of bleeding: yes

## 2022-03-25 NOTE — Plan of Care (Signed)
  Problem: Education: Goal: Knowledge of General Education information will improve Description: Including pain rating scale, medication(s)/side effects and non-pharmacologic comfort measures Outcome: Progressing   Problem: Health Behavior/Discharge Planning: Goal: Ability to manage health-related needs will improve Outcome: Progressing   Problem: Clinical Measurements: Goal: Ability to maintain clinical measurements within normal limits will improve Outcome: Progressing Goal: Will remain free from infection Outcome: Progressing Goal: Diagnostic test results will improve Outcome: Progressing Goal: Respiratory complications will improve Outcome: Progressing Goal: Cardiovascular complication will be avoided Outcome: Progressing   Problem: Activity: Goal: Risk for activity intolerance will decrease Outcome: Progressing   Problem: Nutrition: Goal: Adequate nutrition will be maintained Outcome: Progressing   Problem: Coping: Goal: Level of anxiety will decrease Outcome: Progressing   Problem: Elimination: Goal: Will not experience complications related to bowel motility Outcome: Progressing Goal: Will not experience complications related to urinary retention Outcome: Progressing   Problem: Pain Managment: Goal: General experience of comfort will improve Outcome: Progressing   Problem: Safety: Goal: Ability to remain free from injury will improve Outcome: Progressing   Problem: Education: Goal: Ability to describe self-care measures that may prevent or decrease complications (Diabetes Survival Skills Education) will improve Outcome: Progressing Goal: Individualized Educational Video(s) Outcome: Progressing   Problem: Cardiac: Goal: Ability to maintain an adequate cardiac output will improve Outcome: Progressing   Problem: Health Behavior/Discharge Planning: Goal: Ability to identify and utilize available resources and services will improve Outcome:  Progressing Goal: Ability to manage health-related needs will improve Outcome: Progressing   Problem: Fluid Volume: Goal: Ability to achieve a balanced intake and output will improve Outcome: Progressing   Problem: Metabolic: Goal: Ability to maintain appropriate glucose levels will improve Outcome: Progressing   Problem: Nutritional: Goal: Maintenance of adequate nutrition will improve Outcome: Progressing Goal: Maintenance of adequate weight for body size and type will improve Outcome: Progressing   Problem: Respiratory: Goal: Will regain and/or maintain adequate ventilation Outcome: Progressing   Problem: Urinary Elimination: Goal: Ability to achieve and maintain adequate renal perfusion and functioning will improve Outcome: Progressing   Problem: Education: Goal: Ability to describe self-care measures that may prevent or decrease complications (Diabetes Survival Skills Education) will improve Outcome: Progressing Goal: Individualized Educational Video(s) Outcome: Progressing   Problem: Cardiac: Goal: Ability to maintain an adequate cardiac output will improve Outcome: Progressing   Problem: Health Behavior/Discharge Planning: Goal: Ability to identify and utilize available resources and services will improve Outcome: Progressing Goal: Ability to manage health-related needs will improve Outcome: Progressing   Problem: Fluid Volume: Goal: Ability to achieve a balanced intake and output will improve Outcome: Progressing   Problem: Metabolic: Goal: Ability to maintain appropriate glucose levels will improve Outcome: Progressing   Problem: Nutritional: Goal: Maintenance of adequate nutrition will improve Outcome: Progressing Goal: Maintenance of adequate weight for body size and type will improve Outcome: Progressing   Problem: Respiratory: Goal: Will regain and/or maintain adequate ventilation Outcome: Progressing   Problem: Urinary Elimination: Goal:  Ability to achieve and maintain adequate renal perfusion and functioning will improve Outcome: Progressing

## 2022-03-25 NOTE — Anesthesia Preprocedure Evaluation (Addendum)
Anesthesia Evaluation  Patient identified by MRN, date of birth, ID band Patient awake    Reviewed: Allergy & Precautions, NPO status , Patient's Chart, lab work & pertinent test results  Airway Mallampati: II  TM Distance: >3 FB Neck ROM: Full    Dental no notable dental hx.    Pulmonary asthma , Current Smoker,    Pulmonary exam normal        Cardiovascular hypertension,  Rhythm:Regular Rate:Normal     Neuro/Psych negative neurological ROS  negative psych ROS   GI/Hepatic GERD  ,NASH   Endo/Other  diabetes, Poorly Controlled, Type 2, Insulin Dependent, Oral Hypoglycemic Agents  Renal/GU negative Renal ROS  negative genitourinary   Musculoskeletal  (+) Arthritis , Osteoarthritis,  Left foot infection   Abdominal Normal abdominal exam  (+)   Peds  Hematology  (+) Blood dyscrasia, anemia ,   Anesthesia Other Findings   Reproductive/Obstetrics                           Anesthesia Physical Anesthesia Plan  ASA: 3  Anesthesia Plan: MAC   Post-op Pain Management:    Induction: Intravenous  PONV Risk Score and Plan: Ondansetron, Dexamethasone, Propofol infusion, Midazolam and Treatment may vary due to age or medical condition  Airway Management Planned: Simple Face Mask, Natural Airway and Nasal Cannula  Additional Equipment: None  Intra-op Plan:   Post-operative Plan:   Informed Consent: I have reviewed the patients History and Physical, chart, labs and discussed the procedure including the risks, benefits and alternatives for the proposed anesthesia with the patient or authorized representative who has indicated his/her understanding and acceptance.     Dental advisory given  Plan Discussed with: CRNA  Anesthesia Plan Comments:        Anesthesia Quick Evaluation

## 2022-03-25 NOTE — H&P (Signed)
History and Physical    Patient: Robert Burnett OIN:[id number] DOB: [birth year] DOA: 03/24/2022 DOS: the patient was seen and examined on 03/25/2022 PCP: [name], MD  Patient coming from: Home  Chief Complaint:  Chief Complaint  Patient presents with   foot wound   HPI: ESKER Burnett is a 61 y.o. male with medical history significant of seasonal allergies, unspecified anemia, DDD, asthma, cigarette smoker, type II DM, diabetic peripheral neuropathy, nonalcoholic fatty liver disease, GERD, hyperlipidemia, hypertension, history of pneumonia x3, urothelial cancer who is coming to the emergency department due to worsening of left foot wound for the past 2 weeks, but particularly a lot worse in the last 2 days that it has changing color and been draining a foul-smelling discharge after he kept the wrap for a week.  He stated that his insurance ran out earlier this year and he was unable to get his medications.  He has some pain in the area, but not as intense due to peripheral neuropathy. He denied fever, chills, rhinorrhea, sore throat, wheezing or hemoptysis.  No chest pain, palpitations, diaphoresis, PND, orthopnea or pitting edema of the lower extremities.  No abdominal pain, nausea, emesis, diarrhea, constipation, melena or hematochezia.  No flank pain, dysuria, frequency or hematuria.  No polyuria, polydipsia, polyphagia or blurred vision.   ED course: Initial vital signs were temperature 98.6 F, pulse 71, respiration 18, BP 100/67 mmHg O2 sat 94% on room air.  In addition of fluids, the patient received analgesics and was started on vancomycin, cefepime and metronidazole.  Lab work: Urinalysis with glucosuria more than 500 and ketonuria 20 mg a deciliter.  CBC showed a white count 11.2, hemoglobin 12.1 g/dL and platelets 231.  Sodium was 121, corrected 134 mmol/L.  Potassium 4.2 and CO2 21 mmol/L.  Glucose 646, BUN 51 and creatinine 1.48 mg/dL.  Total protein 8.2 and albumin 2.7 g/dL.  The  rest of the LFTs were normal.  Lactic acid was normal.  Imaging: Questionable osteomyelitis on simple films and MRI.  No acute process in portable chest radiograph.   Review of Systems: As mentioned in the history of present illness. All other systems reviewed and are negative.  Past Medical History:  Diagnosis Date   Allergy    Anemia    Arthritis    ARTHRITIS IN SPINE - MAKES PT HAVE CHEST PAIN- USES fENTANYL PATCH    Asthma    IN TEENS    Diabetes mellitus    toe amp 4/12 NOT ON MEDS IN 2 YEARS    Diabetic peripheral neuropathy associated with type 2 diabetes mellitus ([place]) 12/12/2011   Fatty liver disease, nonalcoholic 7/0/9628   GERD (gastroesophageal reflux disease)    Hyperlipidemia    Hypertension    Pneumonia    HX OF X 3    Urothelial cancer ([place])    Past Surgical History:  Procedure Laterality Date   CYSTOSCOPY/RETROGRADE/URETEROSCOPY Left 10/29/2020   Procedure: CYSTOSCOPY/RETROGRADE/ LEFT URETEROSCOPY WITH BIOPSY, LEFT URETERAL STENT;  Surgeon: Raynelle Bring, MD;  Location: [facility];  Service: Urology;  Laterality: Left;   KNEE SURGERY Bilateral 1975 and 1989   MULTIPLE TOOTH EXTRACTIONS     ROBOT ASSITED LAPAROSCOPIC NEPHROURETERECTOMY Left 12/13/2020   Procedure: XI ROBOT ASSITED LAPAROSCOPIC NEPHROURETERECTOMY , POST OPERATIVE INTRAVESICAL GEMCITABINE;  Surgeon: Raynelle Bring, MD;  Location: [facility];  Service: Urology;  Laterality: Left;  ONLY NEEDS 240 MIN   TOEM AMPUTATION  Left    2012, second toe   TRANSURETHRAL  RESECTION OF BLADDER TUMOR N/A 08/29/2021   Procedure: TRANSURETHRAL RESECTION OF BLADDER TUMOR (TURBT) WITH CYSTOSCOPY;  Surgeon: Raynelle Bring, MD;  Location: [facility];  Service: Urology;  Laterality: N/A;  GENERAL ANESTHESIA WITH PARALYSIS   WISDOM TOOTH EXTRACTION     Social History:  reports that he has been smoking pipe and cigarettes. He has a 10.00 pack-year smoking history. He has quit using smokeless tobacco.  His smokeless tobacco use  included chew. He reports that he does not drink alcohol and does not use drugs.  Allergies  Allergen Reactions   Prozac [Fluoxetine Hcl]     Headaches    Amlodipine Rash    Family History  Problem Relation Age of Onset   Hypertension Father    Diabetes Father    Cancer Father    Diabetes Sister    Cancer Sister    Cancer Mother     Prior to Admission medications   Medication Sig Start Date End Date Taking? Authorizing Provider  atenolol (TENORMIN) 100 MG tablet Take 0.5 tablets (50 mg total) by mouth daily. Patient taking differently: Take 100 mg by mouth daily. 01/06/19  Yes Wendie Agreste, MD  atorvastatin (LIPITOR) 80 MG tablet Take 80 mg by mouth daily. 04/25/21  Yes [provider]  fentaNYL (DURAGESIC) 75 MCG/HR Place 1 patch onto the skin every 3 (three) days. 03/30/19  Yes [name], MD  finasteride (PROSCAR) 5 MG tablet Take 5 mg by mouth daily.   Yes [provider]  losartan-hydrochlorothiazide (HYZAAR) 50-12.5 MG tablet Take 1 tablet by mouth daily.   Yes [provider]  metFORMIN (GLUCOPHAGE-XR) 500 MG 24 hr tablet Take 500 mg by mouth 3 (three) times daily. 04/25/21  Yes [provider]  Oxycodone HCl 10 MG TABS Take 10 mg by mouth every 4 (four) hours as needed (pain). 07/10/21  Yes [provider]  pantoprazole (PROTONIX) 40 MG tablet Take 1 tablet (40 mg total) by mouth daily. Patient taking differently: Take 40 mg by mouth daily as needed (acid reflux/indigestion). 01/06/19  Yes Wendie Agreste, MD  FREESTYLE LITE test strip 1 each 2 (two) times daily. 08/08/21   [provider]  Lancets (FREESTYLE) lancets 1 each 2 (two) times daily. 08/08/21   [provider]  nitroGLYCERIN (NITRODUR - DOSED IN MG/24 HR) 0.2 mg/hr patch Place 1/4 to 1/2 of a patch over affected region. Remove and replace once daily.  Slightly alter skin placement daily Patient not taking: Reported on 03/25/2022 08/01/21   Gregor Hams, MD  phenazopyridine (PYRIDIUM) 200 MG tablet Take 1 tablet (200 mg total) by mouth 3 (three) times daily as needed for pain. Patient not taking: Reported on 03/25/2022 08/29/21   Raynelle Bring, MD    Physical Exam: Vitals:   03/25/22 0500 03/25/22 0545 03/25/22 0600 03/25/22 0700  BP:  127/69 128/73 (!) 116/98  Pulse:  75 71 78  Resp:  14 18 (!) 25  Temp:  99.2 F (37.3 C)    TempSrc:  Oral    SpO2:  95% 98% 98%  Weight: 77 kg     Height:  6' (1.829 m)     Physical Exam Vitals and nursing note reviewed.  Constitutional:      General: He is awake.     Appearance: He is ill-appearing.  HENT:     Head: Normocephalic.     Mouth/Throat:     Mouth: Mucous membranes are dry.  Eyes:  General: No scleral icterus.    Pupils: Pupils are equal, round, and reactive to light.  Neck:     Vascular: No JVD.  Cardiovascular:     Rate and Rhythm: Regular rhythm. Tachycardia present.     Pulses:          Dorsalis pedis pulses are 2+ on the right side and 2+ on the left side.     Heart sounds: S1 normal and S2 normal.  Pulmonary:     Effort: Pulmonary effort is normal.     Breath sounds: Normal breath sounds. No wheezing, rhonchi or rales.  Abdominal:     General: Bowel sounds are normal. There is no distension.     Palpations: Abdomen is soft.     Tenderness: There is no abdominal tenderness. There is no right CVA tenderness, left CVA tenderness or guarding.  Musculoskeletal:     Cervical back: Neck supple.     Right lower leg: No edema.     Left lower leg: No edema.  Feet:     Left foot:     Skin integrity: Ulcer, erythema and warmth present.     Comments: Left fifth toe amputation. Gangrenous ulcer with foul-smelling discharge. Please see pictures below. Skin:    General: Skin is warm and dry.     Findings: Rash present. Rash is macular.     Comments: Reticular macular purpuric rash of chest wall.  Neurological:     General: No focal deficit present.     Mental Status:  He is alert and oriented to person, place, and time.  Psychiatric:        Mood and Affect: Mood normal.        Behavior: Behavior normal. Behavior is cooperative.               Data Reviewed:  Results are pending, will review when available.  Assessment and Plan: Principal Problem:   Gangrene of left foot ([place])   Pyogenic inflammation of bone (HCC)   Necrotizing soft tissue infection In the setting of:   Type 2 diabetes mellitus with left diabetic foot infection ([place]) Admit to stepdown/inpatient. Continue IV fluids. Continue insulin infusion. Replace electrolytes as needed. Consult diabetes coordinator. Continue Zosyn and Zyvox. Analgesics as needed. Follow-up blood culture and sensitivity Check ESR, CRP and prealbumin. Follow CBC and CMP in a.m. Check MRI to right foot. Podiatry consult appreciated.  Active Problems:   Diabetic peripheral neuropathy associated    with type 2 diabetes mellitus (H[facility]) Analgesics as needed. May benefit from gabapentin or pregabalin.    Thoracic spondylosis with myelopathy Continue Duragesic patch.    Hypertension Continue atenolol 100 mg p.o. daily.    Fatty liver disease, nonalcoholic Monitor LFTs.    Hypokalemia Replacing. Magnesium was supplemented.    Protein-calorie malnutrition, severe (HCC) Protein supplementation. Nutritional services evaluation.     Advance Care Planning:   Code Status: Full Code   Consults: Podiatry (Dr. Sherryle Lis).  Family Communication:   Severity of Illness: The appropriate patient status for this patient is INPATIENT. Inpatient status is judged to be reasonable and necessary in order to provide the required intensity of service to ensure the patient's safety. The patient's presenting symptoms, physical exam findings, and initial radiographic and laboratory data in the context of their chronic comorbidities is felt to place them at high risk for further clinical deterioration.  Furthermore, it is not anticipated that the patient will be medically stable for discharge from the hospital within 2 midnights  of admission.   * I certify that at the point of admission it is my clinical judgment that the patient will require inpatient hospital care spanning beyond 2 midnights from the point of admission due to high intensity of service, high risk for further deterioration and high frequency of surveillance required.*  Author: Reubin Milan, MD 03/25/2022 8:28 AM  For on call review [email or web address].   This document was prepared using Dragon voice recognition software and may contain some unintended transcription errors.

## 2022-03-25 NOTE — Progress Notes (Signed)
A consult was received from an ED physician for Cefepime + Vancomycin per pharmacy dosing.  The patient's profile has been reviewed for ht/wt/allergies/indication/available labs.   A one time order has been placed for Cefepime 2gm + Vancomycin 2gm IV.   Further antibiotics/pharmacy consults should be ordered by admitting physician if indicated.                       Thank you, Netta Cedars PharmD 03/25/2022  12:12 AM

## 2022-03-25 NOTE — Consult Note (Signed)
Reason for Consult: Diabetic foot infection Referring Physician: Dr. [name] is an 61 y.o. male.  HPI: He was admitted overnight for worsening left foot infection he says his foot has never been this bad and has been worsening over the last few days and started develop malodor so he presented to the hospital for admission.  Past Medical History:  Diagnosis Date   Allergy    Anemia    Arthritis    ARTHRITIS IN SPINE - MAKES PT HAVE CHEST PAIN- USES fENTANYL PATCH    Asthma    IN TEENS    Diabetes mellitus    toe amp 4/12 NOT ON MEDS IN 2 YEARS    Diabetic peripheral neuropathy associated with type 2 diabetes mellitus ([place]) 12/12/2011   Fatty liver disease, nonalcoholic 11/22/6331   GERD (gastroesophageal reflux disease)    Hyperlipidemia    Hypertension    Pneumonia    HX OF X 3    Urothelial cancer ([place])     Past Surgical History:  Procedure Laterality Date   CYSTOSCOPY/RETROGRADE/URETEROSCOPY Left 10/29/2020   Procedure: CYSTOSCOPY/RETROGRADE/ LEFT URETEROSCOPY WITH BIOPSY, LEFT URETERAL STENT;  Surgeon: Raynelle Bring, MD;  Location: [facility];  Service: Urology;  Laterality: Left;   KNEE SURGERY Bilateral 1975 and 1989   MULTIPLE TOOTH EXTRACTIONS     ROBOT ASSITED LAPAROSCOPIC NEPHROURETERECTOMY Left 12/13/2020   Procedure: XI ROBOT ASSITED LAPAROSCOPIC NEPHROURETERECTOMY , POST OPERATIVE INTRAVESICAL GEMCITABINE;  Surgeon: Raynelle Bring, MD;  Location: [facility];  Service: Urology;  Laterality: Left;  ONLY NEEDS 240 MIN   TOEM AMPUTATION  Left    2012, second toe   TRANSURETHRAL RESECTION OF BLADDER TUMOR N/A 08/29/2021   Procedure: TRANSURETHRAL RESECTION OF BLADDER TUMOR (TURBT) WITH CYSTOSCOPY;  Surgeon: Raynelle Bring, MD;  Location: [facility];  Service: Urology;  Laterality: N/A;  GENERAL ANESTHESIA WITH PARALYSIS   WISDOM TOOTH EXTRACTION      Family History  Problem Relation Age of Onset   Hypertension Father    Diabetes Father    Cancer Father     Diabetes Sister    Cancer Sister    Cancer Mother     Social History:  reports that he has been smoking pipe and cigarettes. He has a 10.00 pack-year smoking history. He has quit using smokeless tobacco.  His smokeless tobacco use included chew. He reports that he does not drink alcohol and does not use drugs.  Allergies:  Allergies  Allergen Reactions   Prozac [Fluoxetine Hcl]     Headaches    Amlodipine Rash    Medications: I have reviewed the patient's current medications.  Results for orders placed or performed during the hospital encounter of 03/24/22 (from the past 48 hour(s))  CBC with Differential     Status: Abnormal   Collection Time: 03/24/22 12:30 AM  Result Value Ref Range   WBC 11.2 (H) 4.0 - 10.5 K/uL   RBC 3.89 (L) 4.22 - 5.81 MIL/uL   Hemoglobin 12.1 (L) 13.0 - 17.0 g/dL   HCT 34.8 (L) 39.0 - 52.0 %   MCV 89.5 80.0 - 100.0 fL   MCH 31.1 26.0 - 34.0 pg   MCHC 34.8 30.0 - 36.0 g/dL   RDW 12.1 11.5 - 15.5 %   Platelets 231 150 - 400 K/uL   nRBC 0.0 0.0 - 0.2 %   Neutrophils Relative % 86 %   Neutro Abs 9.6 (H) 1.7 - 7.7 K/uL   Lymphocytes Relative 6 %  Lymphs Abs 0.7 0.7 - 4.0 K/uL   Monocytes Relative 7 %   Monocytes Absolute 0.8 0.1 - 1.0 K/uL   Eosinophils Relative 0 %   Eosinophils Absolute 0.0 0.0 - 0.5 K/uL   Basophils Relative 0 %   Basophils Absolute 0.0 0.0 - 0.1 K/uL   Immature Granulocytes 1 %   Abs Immature Granulocytes 0.12 (H) 0.00 - 0.07 K/uL    Comment: Performed at [facility], [place] [address] [place], [place] [address]  POC CBG, ED     Status: Abnormal   Collection Time: 03/24/22 11:59 PM  Result Value Ref Range   Glucose-Capillary >600 (HH) 70 - 99 mg/dL    Comment: Glucose reference range applies only to samples taken after fasting for at least 8 hours.  Comprehensive metabolic panel     Status: Abnormal   Collection Time: 03/25/22 12:30 AM  Result Value Ref Range   Sodium 121 (L) 135 - 145 mmol/L   Potassium  4.2 3.5 - 5.1 mmol/L   Chloride 84 (L) 98 - 111 mmol/L   CO2 21 (L) 22 - 32 mmol/L   Glucose, Bld 646 (HH) 70 - 99 mg/dL    Comment: Glucose reference range applies only to samples taken after fasting for at least 8 hours. CRITICAL RESULT CALLED TO, READ BACK BY AND VERIFIED WITH:  LISA KELSEY RN 03/21/22 @ 0122 VS    BUN 51 (H) 8 - 23 mg/dL   Creatinine, Ser 1.48 (H) 0.61 - 1.24 mg/dL   Calcium 9.1 8.9 - 10.3 mg/dL   Total Protein 8.2 (H) 6.5 - 8.1 g/dL   Albumin 2.7 (L) 3.5 - 5.0 g/dL   AST 27 15 - 41 U/L   ALT 29 0 - 44 U/L   Alkaline Phosphatase 100 38 - 126 U/L   Total Bilirubin 0.9 0.3 - 1.2 mg/dL   GFR, Estimated 53 (L) >60 mL/min    Comment: (NOTE) Calculated using the CKD-EPI Creatinine Equation (2021)    Anion gap 16 (H) 5 - 15    Comment: Performed at [facility], [place] [address], [place], [place] [address]  Lactic acid     Status: None   Collection Time: 03/25/22 12:51 AM  Result Value Ref Range   Lactic Acid, Venous 1.6 0.5 - 1.9 mmol/L    Comment: Performed at [facility], [place] [address], [place], [place] [address]  I-stat chem 8, ED (not at [facility] or [facility])     Status: Abnormal   Collection Time: 03/25/22  1:51 AM  Result Value Ref Range   Sodium 123 (L) 135 - 145 mmol/L   Potassium 4.4 3.5 - 5.1 mmol/L   Chloride 89 (L) 98 - 111 mmol/L   BUN 42 (H) 8 - 23 mg/dL   Creatinine, Ser 1.30 (H) 0.61 - 1.24 mg/dL   Glucose, Bld 645 (HH) 70 - 99 mg/dL    Comment: Glucose reference range applies only to samples taken after fasting for at least 8 hours.   Calcium, Ion 1.17 1.15 - 1.40 mmol/L   TCO2 21 (L) 22 - 32 mmol/L   Hemoglobin 12.6 (L) 13.0 - 17.0 g/dL   HCT 37.0 (L) 39.0 - 52.0 %   Comment NOTIFIED PHYSICIAN   CBG monitoring, ED     Status: Abnormal   Collection Time: 03/25/22  2:28 AM  Result Value Ref Range   Glucose-Capillary >600 (HH) 70 - 99 mg/dL    Comment: Glucose reference  range applies only to samples taken after  fasting for at least 8 hours.  CBG monitoring, ED     Status: Abnormal   Collection Time: 03/25/22  3:16 AM  Result Value Ref Range   Glucose-Capillary 409 (H) 70 - 99 mg/dL    Comment: Glucose reference range applies only to samples taken after fasting for at least 8 hours.  Urinalysis, Routine w reflex microscopic Urine, Clean Catch     Status: Abnormal   Collection Time: 03/25/22  3:30 AM  Result Value Ref Range   Color, Urine YELLOW YELLOW   APPearance CLEAR CLEAR   Specific Gravity, Urine 1.022 1.005 - 1.030   pH 5.0 5.0 - 8.0   Glucose, UA >=500 (A) NEGATIVE mg/dL   Hgb urine dipstick NEGATIVE NEGATIVE   Bilirubin Urine NEGATIVE NEGATIVE   Ketones, ur 20 (A) NEGATIVE mg/dL   Protein, ur NEGATIVE NEGATIVE mg/dL   Nitrite NEGATIVE NEGATIVE   Leukocytes,Ua NEGATIVE NEGATIVE   Bacteria, UA NONE SEEN NONE SEEN   Mucus PRESENT     Comment: Performed at [place] [address], [place], [place] [address]  CBG monitoring, ED     Status: Abnormal   Collection Time: 03/25/22  3:55 AM  Result Value Ref Range   Glucose-Capillary 293 (H) 70 - 99 mg/dL    Comment: Glucose reference range applies only to samples taken after fasting for at least 8 hours.  CBG monitoring, ED     Status: Abnormal   Collection Time: 03/25/22  5:06 AM  Result Value Ref Range   Glucose-Capillary 229 (H) 70 - 99 mg/dL    Comment: Glucose reference range applies only to samples taken after fasting for at least 8 hours.  MRSA Next Gen by PCR, Nasal     Status: Abnormal   Collection Time: 03/25/22  5:48 AM   Specimen: Nasal Mucosa; Nasal Swab  Result Value Ref Range   MRSA by PCR Next Gen DETECTED (A) NOT DETECTED    Comment: (NOTE) The GeneXpert MRSA Assay (FDA approved for NASAL specimens only), is one component of a comprehensive MRSA colonization surveillance program. It is not intended to diagnose MRSA infection nor to guide or monitor treatment for MRSA infections. Test  performance is not FDA approved in patients less than 36 years old. Performed at [facility], [place] [address], [place], [place] [address]   Glucose, capillary     Status: Abnormal   Collection Time: 03/25/22  6:04 AM  Result Value Ref Range   Glucose-Capillary 246 (H) 70 - 99 mg/dL    Comment: Glucose reference range applies only to samples taken after fasting for at least 8 hours.  Glucose, capillary     Status: Abnormal   Collection Time: 03/25/22  7:19 AM  Result Value Ref Range   Glucose-Capillary 243 (H) 70 - 99 mg/dL    Comment: Glucose reference range applies only to samples taken after fasting for at least 8 hours.  Glucose, capillary     Status: Abnormal   Collection Time: 03/25/22  8:25 AM  Result Value Ref Range   Glucose-Capillary 235 (H) 70 - 99 mg/dL    Comment: Glucose reference range applies only to samples taken after fasting for at least 8 hours.  Glucose, capillary     Status: Abnormal   Collection Time: 03/25/22  9:39 AM  Result Value Ref Range   Glucose-Capillary 256 (H) 70 - 99 mg/dL    Comment: Glucose reference range  applies only to samples taken after fasting for at least 8 hours.  CBC with Differential/Platelet     Status: Abnormal   Collection Time: 03/25/22 10:11 AM  Result Value Ref Range   WBC 11.9 (H) 4.0 - 10.5 K/uL   RBC 3.80 (L) 4.22 - 5.81 MIL/uL   Hemoglobin 11.9 (L) 13.0 - 17.0 g/dL   HCT 35.4 (L) 39.0 - 52.0 %   MCV 93.2 80.0 - 100.0 fL   MCH 31.3 26.0 - 34.0 pg   MCHC 33.6 30.0 - 36.0 g/dL   RDW 12.2 11.5 - 15.5 %   Platelets 259 150 - 400 K/uL   nRBC 0.0 0.0 - 0.2 %   Neutrophils Relative % 83 %   Neutro Abs 10.0 (H) 1.7 - 7.7 K/uL   Lymphocytes Relative 8 %   Lymphs Abs 0.9 0.7 - 4.0 K/uL   Monocytes Relative 8 %   Monocytes Absolute 0.9 0.1 - 1.0 K/uL   Eosinophils Relative 0 %   Eosinophils Absolute 0.0 0.0 - 0.5 K/uL   Basophils Relative 0 %   Basophils Absolute 0.0 0.0 - 0.1 K/uL   Immature Granulocytes 1 %    Abs Immature Granulocytes 0.07 0.00 - 0.07 K/uL    Comment: Performed at [facility], [place] [address], [place], [place] [address]  Beta-hydroxybutyric acid     Status: None   Collection Time: 03/25/22 10:11 AM  Result Value Ref Range   Beta-Hydroxybutyric Acid 0.26 0.05 - 0.27 mmol/L    Comment: Performed at [facility], [place] [address], [place], [place] [address]  Blood gas, venous     Status: Abnormal   Collection Time: 03/25/22 10:16 AM  Result Value Ref Range   pH, Ven 7.29 7.25 - 7.43   pCO2, Ven 45 44 - 60 mmHg   pO2, Ven <31 (LL) 32 - 45 mmHg    Comment: CRITICAL RESULT CALLED TO, READ BACK BY AND VERIFIED WITH: COCHRAN, H. RN AT 2671 ON 03/25/2022 BY MECIAL J.    Bicarbonate 21.6 20.0 - 28.0 mmol/L   Acid-base deficit 4.8 (H) 0.0 - 2.0 mmol/L   O2 Saturation 32.2 %   Patient temperature 36.9     Comment: Performed at [facility], [place] [address], [place], [place] [address]  Glucose, capillary     Status: Abnormal   Collection Time: 03/25/22 10:40 AM  Result Value Ref Range   Glucose-Capillary 227 (H) 70 - 99 mg/dL    Comment: Glucose reference range applies only to samples taken after fasting for at least 8 hours.  Glucose, capillary     Status: Abnormal   Collection Time: 03/25/22 11:39 AM  Result Value Ref Range   Glucose-Capillary 275 (H) 70 - 99 mg/dL    Comment: Glucose reference range applies only to samples taken after fasting for at least 8 hours.  Comprehensive metabolic panel     Status: Abnormal   Collection Time: 03/25/22 11:51 AM  Result Value Ref Range   Sodium 131 (L) 135 - 145 mmol/L    Comment: DELTA CHECK NOTED   Potassium 3.1 (L) 3.5 - 5.1 mmol/L    Comment: DELTA CHECK NOTED   Chloride 96 (L) 98 - 111 mmol/L   CO2 22 22 - 32 mmol/L   Glucose, Bld 252 (H) 70 - 99 mg/dL    Comment: Glucose reference range applies only to samples taken after fasting for at least 8 hours. RESULTS VERIFIED VIA  RECOLLECT  BUN 44 (H) 8 - 23 mg/dL   Creatinine, Ser 1.21 0.61 - 1.24 mg/dL   Calcium 8.9 8.9 - 10.3 mg/dL   Total Protein 6.8 6.5 - 8.1 g/dL   Albumin 2.3 (L) 3.5 - 5.0 g/dL   AST 19 15 - 41 U/L   ALT 22 0 - 44 U/L   Alkaline Phosphatase 71 38 - 126 U/L   Total Bilirubin 0.8 0.3 - 1.2 mg/dL   GFR, Estimated >60 >60 mL/min    Comment: (NOTE) Calculated using the CKD-EPI Creatinine Equation (2021)    Anion gap 13 5 - 15    Comment: Performed at [facility], [place] [address], [place], [place] [address]  Magnesium     Status: None   Collection Time: 03/25/22 11:51 AM  Result Value Ref Range   Magnesium 1.8 1.7 - 2.4 mg/dL    Comment: Performed at [facility] [place] [address], [place], [place] [address]    DG CHEST PORT 1 VIEW  Result Date: 03/25/2022 CLINICAL DATA:  Preoperative evaluation EXAM: PORTABLE CHEST 1 VIEW COMPARISON:  2012 FINDINGS: The heart size and mediastinal contours are within normal limits. Both lungs are clear. No pleural effusion. The visualized skeletal structures are unremarkable. IMPRESSION: No acute process in the chest. Electronically Signed   By: Macy Mis M.D.   On: 03/25/2022 10:42   DG Foot Complete Left  Result Date: 03/25/2022 CLINICAL DATA:  Gangrene left foot. EXAM: LEFT FOOT - COMPLETE 3+ VIEW COMPARISON:  08/19/2021. FINDINGS: No acute fracture or dislocation. There has been amputation of the second digit and mid to distal second metatarsal. Degenerative changes are noted at the first metatarsophalangeal joint and midfoot. There is soft tissue swelling at the forefoot with subcutaneous air about the third, fourth, and fifth digits and distal metatarsals. Lucencies are noted in the base of the proximal phalanx of the first digit. IMPRESSION: 1. Soft tissue swelling at the forefoot with subcutaneous air involving the third, fourth, and fifth digits and distal metatarsals, concerning for necrotizing fasciitis. 2.  Lucencies in the base of the proximal phalanx of the fifth digit, possible osteomyelitis versus artifact overlying gas in the soft tissues. Electronically Signed   By: Brett Fairy M.D.   On: 03/25/2022 01:27   VAS [place] ABI WITH/WO TBI  Result Date: 03/25/2022  LOWER EXTREMITY DOPPLER STUDY Patient Name:  AMRIT CRESS  Date of Exam:   03/25/2022 Medical Rec #: [id number]         Accession #:    [id number] Date of Birth: 29-Apr-1961          Patient Gender: M Patient Age:   78 years Exam Location:  [facility] Procedure:      VAS [place] ABI WITH/WO TBI Referring Phys: DAVID ORTIZ --------------------------------------------------------------------------------  Indications: Ulceration. High Risk Factors: Hypertension, Diabetes.  Limitations: Today's exam was limited due to an open wound, involuntary patient              movement and bandages. Comparison Study: No prior studies. Performing Technologist: Carlos Levering RVT  Examination Guidelines: A complete evaluation includes at minimum, Doppler waveform signals and systolic blood pressure reading at the level of bilateral brachial, anterior tibial, and posterior tibial arteries, when vessel segments are accessible. Bilateral testing is considered an integral part of a complete examination. Photoelectric Plethysmograph (PPG) waveforms and toe systolic pressure readings are included as required and additional duplex testing as needed. Limited examinations for reoccurring indications may be  performed as noted.  ABI Findings: +---------+------------------+-----+---------+--------+ Right    Rt Pressure (mmHg)IndexWaveform Comment  +---------+------------------+-----+---------+--------+ Brachial 113                    triphasic         +---------+------------------+-----+---------+--------+ PTA      140               1.19 triphasic         +---------+------------------+-----+---------+--------+ DP       121               1.03 triphasic          +---------+------------------+-----+---------+--------+ Great Toe68                0.58                   +---------+------------------+-----+---------+--------+ +---------+------------------+-----+---------+----------+ Left     Lt Pressure (mmHg)IndexWaveform Comment    +---------+------------------+-----+---------+----------+ Brachial 118                    triphasic           +---------+------------------+-----+---------+----------+ PTA      137               1.16 biphasic            +---------+------------------+-----+---------+----------+ DP       125               1.06 biphasic            +---------+------------------+-----+---------+----------+ Great Toe                                Ulceration +---------+------------------+-----+---------+----------+  Summary: Right: Resting right ankle-brachial index is within normal range. No evidence of significant right lower extremity arterial disease. The right toe-brachial index is abnormal. Left: Resting left ankle-brachial index is within normal range. No evidence of significant left lower extremity arterial disease. Unable to obtain TBI due to great toe ulceration. *See table(s) above for measurements and observations.     Preliminary     Review of Systems  Constitutional:  Positive for chills and malaise/fatigue. Negative for fever.  Skin:        Foot infection left  All other systems reviewed and are negative. Blood pressure (!) 94/39, pulse 65, temperature 98.3 F (36.8 C), temperature source Oral, resp. rate 16, height 6' (1.829 m), weight 77 kg, SpO2 96 %.  Vitals:   03/25/22 1200 03/25/22 1212  BP: (!) 94/39   Pulse: 65   Resp: 16   Temp:  98.3 F (36.8 C)  SpO2: 96%     General AA&O x3. Normal mood and affect.  Vascular Dorsalis pedis and posterior tibial pulses  present 2+ left  Capillary refill to dorsal foot is normal Pedal hair growth diminished.  Neurologic Epicritic sensation grossly grossly  absent.  Dermatologic (Wound) Necrosis of lateral toes of left foot with significant malodor and purulence and exposed bone tendon  Orthopedic: Motor intact BLE.    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  MRI is ordered I spoke with the radiology tech and they will be completing this around 2 to 3 PM today -Antibiotics: Continue broad-spectrum antibiotics per IDSA guidelines.  Currently on Vanco Flagyl and cefepime -WB Status: He will be NWB on the LLE after surgery today -Surgical Plan: Plan  for OR tonight for transmetatarsal amputation.  I discussed the procedure itself the rationale for this and the risk benefits and potential complications of surgery with him.  He understands and wishes to proceed.  [name] 03/25/2022, 1:00 PM   Best available via secure chat for questions or concerns.

## 2022-03-25 NOTE — Progress Notes (Signed)
Inpatient Diabetes Program Recommendations  AACE/ADA: New Consensus Statement on Inpatient Glycemic Control (2015)  Target Ranges:  Prepandial:   less than 140 mg/dL      Peak postprandial:   less than 180 mg/dL (1-2 hours)      Critically ill patients:  140 - 180 mg/dL    Latest Reference Range & Units 03/25/22 00:30  Sodium 135 - 145 mmol/L 121 (L)  Potassium 3.5 - 5.1 mmol/L 4.2  Chloride 98 - 111 mmol/L 84 (L)  CO2 22 - 32 mmol/L 21 (L)  Glucose 70 - 99 mg/dL 646 (HH)  BUN 8 - 23 mg/dL 51 (H)  Creatinine 0.61 - 1.24 mg/dL 1.48 (H)  Calcium 8.9 - 10.3 mg/dL 9.1  Anion gap 5 - 15  16 (H)    Latest Reference Range & Units 08/23/21 13:13  Hemoglobin A1C 4.8 - 5.6 % 11.2 (H)    Latest Reference Range & Units 03/24/22 23:59 03/25/22 02:28 03/25/22 03:16 03/25/22 03:55 03/25/22 05:06 03/25/22 06:04  Glucose-Capillary 70 - 99 mg/dL >600 (HH) >600 (HH)  IV Insulin Drip Started 409 (H) 293 (H) 229 (H) 246 (H)  IV Insulin Drip Infusing     Admit with: Gangrenous left foot/ Hyperglycemia  History: DM2  Home DM Meds: Metformin 500 mg TID  Current Orders: IV Insulin Drip    Note IV Insulin Drip Started at 2:30am  MD- Note last A1c on file was 11.2% (November 2022)  Please order current Hemoglobin A1c level    --Will follow patient during hospitalization--  Wyn Quaker RN, MSN, CDE Diabetes Coordinator Inpatient Glycemic Control Team Team Pager: [phone] (8a-5p)

## 2022-03-25 NOTE — Progress Notes (Signed)
PHARMACY - PHYSICIAN COMMUNICATION CRITICAL VALUE ALERT - BLOOD CULTURE IDENTIFICATION (BCID)  Robert Burnett is an 61 y.o. male who presented to [facility] on 03/24/2022 with a chief complaint of diabetic foot infection  Assessment:  Blood culture bottles growing GPC.  BCID Streptococcus agalactiae.  Name of physician (or Provider) Contacted: Dr. Olevia Bowens  Current antibiotics: Zyvox, Zosyn   Changes to prescribed antibiotics recommended:  Continue current antibiotics until after operative management (OR tonight for transmetatarsal amputation) Consider narrowing to Penicillin-G 4 million units IV q4h for bacteremia after source control achieved.      Results for orders placed or performed during the hospital encounter of 03/24/22  Blood Culture ID Panel (Reflexed) (Collected: 03/25/2022 12:30 AM)  Result Value Ref Range   Enterococcus faecalis NOT DETECTED NOT DETECTED   Enterococcus Faecium NOT DETECTED NOT DETECTED   Listeria monocytogenes NOT DETECTED NOT DETECTED   Staphylococcus species NOT DETECTED NOT DETECTED   Staphylococcus aureus (BCID) NOT DETECTED NOT DETECTED   Staphylococcus epidermidis NOT DETECTED NOT DETECTED   Staphylococcus lugdunensis NOT DETECTED NOT DETECTED   Streptococcus species DETECTED (A) NOT DETECTED   Streptococcus agalactiae DETECTED (A) NOT DETECTED   Streptococcus pneumoniae NOT DETECTED NOT DETECTED   Streptococcus pyogenes NOT DETECTED NOT DETECTED   A.calcoaceticus-baumannii NOT DETECTED NOT DETECTED   Bacteroides fragilis NOT DETECTED NOT DETECTED   Enterobacterales NOT DETECTED NOT DETECTED   Enterobacter cloacae complex NOT DETECTED NOT DETECTED   Escherichia coli NOT DETECTED NOT DETECTED   Klebsiella aerogenes NOT DETECTED NOT DETECTED   Klebsiella oxytoca NOT DETECTED NOT DETECTED   Klebsiella pneumoniae NOT DETECTED NOT DETECTED   Proteus species NOT DETECTED NOT DETECTED   Salmonella species NOT DETECTED NOT DETECTED   Serratia  marcescens NOT DETECTED NOT DETECTED   Haemophilus influenzae NOT DETECTED NOT DETECTED   Neisseria meningitidis NOT DETECTED NOT DETECTED   Pseudomonas aeruginosa NOT DETECTED NOT DETECTED   Stenotrophomonas maltophilia NOT DETECTED NOT DETECTED   Candida albicans NOT DETECTED NOT DETECTED   Candida auris NOT DETECTED NOT DETECTED   Candida glabrata NOT DETECTED NOT DETECTED   Candida krusei NOT DETECTED NOT DETECTED   Candida parapsilosis NOT DETECTED NOT DETECTED   Candida tropicalis NOT DETECTED NOT DETECTED   Cryptococcus neoformans/gattii NOT DETECTED NOT DETECTED    Gretta Arab PharmD, BCPS [facility] main pharmacy [phone] 03/25/2022 4:28 PM

## 2022-03-25 NOTE — ED Provider Notes (Signed)
[facility] DEPT Provider Note   CSN: [id number] Arrival date & time: 03/24/22  2339     History  Chief Complaint  Patient presents with   foot wound    Robert Burnett is a 61 y.o. male with a past medical history of hypertension, hyperlipidemia,diabetes who presents the emergency department for evaluation of a foot wound.  The patient has had issues ongoing with a wound on his foot.  His insurance ran out earlier this year and he was unable to get his medications.  Patient states that he noted it was getting worse but he did not have the means to do anything about it.  He kept it wrapped for about a week but his foot pain was becoming increasingly severe today.  When he removed his sock today he was extremely concerned by what he saw and came to the emergency department for further evaluation he is status post left fifth toe amputation previously by Dr. Sharol Given.  He denies fevers.  He complains of significant pain.  He smokes a pipe daily.  EMS reports gangrene and unreasonably high blood sugar on their glucometer. HPI     Home Medications Prior to Admission medications   Medication Sig Start Date End Date Taking? Authorizing Provider  atenolol (TENORMIN) 100 MG tablet Take 0.5 tablets (50 mg total) by mouth daily. Patient taking differently: Take 100 mg by mouth daily. 01/06/19   Wendie Agreste, MD  atorvastatin (LIPITOR) 80 MG tablet Take 80 mg by mouth daily. 04/25/21   [provider]  fentaNYL (DURAGESIC) 75 MCG/HR Place 1 patch onto the skin every 3 (three) days. 03/30/19   [name], MD  finasteride (PROSCAR) 5 MG tablet Take 5 mg by mouth daily.    [provider]  FREESTYLE LITE test strip 1 each 2 (two) times daily. 08/08/21   [provider]  Lancets (FREESTYLE) lancets 1 each 2 (two) times daily. 08/08/21   [provider]  losartan-hydrochlorothiazide (HYZAAR) 50-12.5 MG tablet Take 1 tablet by mouth daily.     [provider]  metFORMIN (GLUCOPHAGE-XR) 500 MG 24 hr tablet Take 500 mg by mouth 3 (three) times daily. 04/25/21   [provider]  nitroGLYCERIN (NITRODUR - DOSED IN MG/24 HR) 0.2 mg/hr patch Place 1/4 to 1/2 of a patch over affected region. Remove and replace once daily.  Slightly alter skin placement daily Patient taking differently: Place 0.8 mg onto the skin daily. Place 1/4 of a patch over affected region. Remove and replace once daily.  Slightly alter skin placement daily 08/01/21   Gregor Hams, MD  Oxycodone HCl 10 MG TABS Take 10 mg by mouth every 4 (four) hours as needed (pain). 07/10/21   [provider]  pantoprazole (PROTONIX) 40 MG tablet Take 1 tablet (40 mg total) by mouth daily. Patient taking differently: Take 40 mg by mouth daily as needed (acid reflux/indigestion). 01/06/19   Wendie Agreste, MD  phenazopyridine (PYRIDIUM) 200 MG tablet Take 1 tablet (200 mg total) by mouth 3 (three) times daily as needed for pain. 08/29/21   Raynelle Bring, MD      Allergies    Prozac [fluoxetine hcl] and Amlodipine    Review of Systems   Review of Systems  Physical Exam Updated Vital Signs BP 100/67 (BP Location: Left Arm)   Pulse 71   Temp 98.6 F (37 C) (Oral)   Resp 18   Ht '6\' 1"'$  (1.854 m)  Wt 86.2 kg   SpO2 94%   BMI 25.07 kg/m  Physical Exam Vitals and nursing note reviewed.  Constitutional:      General: He is not in acute distress.    Appearance: He is well-developed. He is not diaphoretic.  HENT:     Head: Normocephalic and atraumatic.  Eyes:     General: No scleral icterus.    Conjunctiva/sclera: Conjunctivae normal.  Cardiovascular:     Rate and Rhythm: Normal rate and regular rhythm.     Heart sounds: Normal heart sounds.  Pulmonary:     Effort: Pulmonary effort is normal. No respiratory distress.     Breath sounds: Normal breath sounds.  Chest:     Comments: Chronic reticular skin mottling bilateral chest wall Abdominal:      Palpations: Abdomen is soft.     Tenderness: There is no abdominal tenderness.  Musculoskeletal:     Cervical back: Normal range of motion and neck supple.     Comments: Left foot is gangrenous, foul-smelling with necrotic tissue, exposed tendons.  There is erythema and swelling of the foot and ankle with lymphangitis extending medially along the left ankle.  2+ DP pulse present.  Skin:    General: Skin is warm and dry.  Neurological:     Mental Status: He is alert.  Psychiatric:        Behavior: Behavior normal.             ED Results / Procedures / Treatments   Labs (all labs ordered are listed, but only abnormal results are displayed) Labs Reviewed  CBG MONITORING, ED - Abnormal; Notable for the following components:      Result Value   Glucose-Capillary >600 (*)    All other components within normal limits  CBC WITH DIFFERENTIAL/PLATELET  COMPREHENSIVE METABOLIC PANEL    EKG None  Radiology No results found.  Procedures .Critical Care Performed by: Margarita Mail, PA-C Authorized by: Margarita Mail, PA-C   Critical care provider statement:    Critical care time (minutes):  60   Critical care time was exclusive of:  Separately billable procedures and treating other patients   Critical care was necessary to treat or prevent imminent or life-threatening deterioration of the following conditions:  Metabolic crisis   Critical care was time spent personally by me on the following activities:  Development of treatment plan with patient or surrogate, discussions with consultants, evaluation of patient's response to treatment, examination of patient, ordering and review of laboratory studies, ordering and review of radiographic studies, ordering and performing treatments and interventions, pulse oximetry, re-evaluation of patient's condition and review of old charts    Medications Ordered in ED Medications - No data to display  ED Course/ Medical Decision  Making/ A&P Clinical Course as of 03/25/22 0409  Tue Mar 25, 2022  0108 WBC(!): 11.2 [AH]  0108 Glucose-Capillary(!!): >600 [AH]  0146 Sodium(!): 121 [AH]  0146 CO2(!): 21 [AH]  0405 DG Foot Complete Left [AH]    Clinical Course User Index [AH] Margarita Mail, PA-C                           Medical Decision Making 61 y/o male who presents with gangrene of the Left foot and toes. On review of findings patient has gangrene and osteomyelitis. Although the radiologist is reading possible necrotizing fasciitis, I have visualized the Plain films and the air is in the region of the open, necrotic tissue  with no air tracking past this region and I do not think he has NF. Patient has soft pressures, hyperglycemia, ? Sepsis vs dehydration. Fluids and broad spectrum abx given.  Patient admitted to Hospitalist, [facility] .  Amount and/or Complexity of Data Reviewed Labs: ordered. Decision-making details documented in ED Course.    Details: cbc with leukocytosis, blood sugar >600 with associated hyponatremia, mild anion gap of 16- doubt dka Radiology: ordered and independent interpretation performed. Decision-making details documented in ED Course.    Details: i visualized and interpreted plain films of the foot.. + osteo and gas in the tissue  Risk Prescription drug management. Decision regarding hospitalization.           Final Clinical Impression(s) / ED Diagnoses Final diagnoses:  None    Rx / DC Orders ED Discharge Orders     None         Margarita Mail, PA-C 03/26/22 1546    Maudie Flakes, MD 03/27/22 [phone]

## 2022-03-25 NOTE — Progress Notes (Signed)
ABI's have been completed. Preliminary results can be found in CV Proc through chart review.   03/25/22 8:58 AM Robert Burnett RVT

## 2022-03-25 NOTE — Progress Notes (Signed)
  Carryover admission to the Day Admitter.  I discussed this case with the EDP, Margarita Mail, PA.  Per these discussions:  This is a 61 year old male with history of diabetes who is being admitted with gangrenous left foot presenting with several weeks of progressive left foot.  Started on IV vancomycin, Flagyl, cefepime, to be continued.  Additionally, blood sugars greater than 600 and 16.  Was started on insulin drip per Endo tool.  Less likely DKA, but VBG and beta hydroxybutyric acid pending.  We will continue individual with insulin drip for now and monitor every 4 BMPs through 9 AM.  Also ordered every hour CBG monitoring.  I have placed an order for inpatient admission for further evaluation management of gangrenous left foot.   I have placed some additional preliminary admit orders via the adult multi-morbid admission order set. I have also ordered n.p.o.  Of note, orthopedic surgery has not yet been consulted.    Babs Bertin, DO Hospitalist

## 2022-03-25 NOTE — Progress Notes (Signed)
Dr. Sherryle Lis called for consent orders. Pt is drowsy from medication given during MRI per ICU nurse. Pts cousin Robert Burnett is the emergency contact. He was called and is available to give consent.

## 2022-03-25 NOTE — Transfer of Care (Signed)
Immediate Anesthesia Transfer of Care Note  Patient: Robert Burnett  Procedure(s) Performed: AMPUTATION DIGITS,TRANSMETARSAL AMPUTATION, REMOVAL OF TOES AND BONE BEHIND TOES  (Left: Foot) APPLICATION OF WOUND VAC (Left: Foot)  Patient Location: PACU  Anesthesia Type:MAC  Level of Consciousness: sedated, patient cooperative and responds to stimulation  Airway & Oxygen Therapy: Patient Spontanous Breathing and Patient connected to face mask oxygen  Post-op Assessment: Report given to RN and Post -op Vital signs reviewed and stable  Post vital signs: Reviewed and stable  Last Vitals:  Vitals Value Taken Time  BP 99/52 03/25/22 2030  Temp    Pulse 34 03/25/22 2031  Resp 13 03/25/22 2031  SpO2 100 % 03/25/22 2031  Vitals shown include unvalidated device data.  Last Pain:  Vitals:   03/25/22 1610  TempSrc:   PainSc: Asleep         Complications: No notable events documented.

## 2022-03-25 NOTE — Op Note (Signed)
Patient Name: Robert Burnett DOB: December 31, 1960  MRN: [id number]   Date of Service: 03/24/2022 - 03/25/2022  Surgeon: Dr. Lanae Crumbly, DPM Assistants: None Pre-operative Diagnosis:  Severe diabetic foot infection Wet gangrene Necrotizing foot infection Osteomyelitis Post-operative Diagnosis:   1. Severe diabetic foot infectio  2. Wet gangrene  3. Necrotizing foot infection  4. Osteomyelitis  5. Abscess of plantar vault into tendon sheath of flexor tendons Procedures:  1) transmetatarsal amputation left foot  2) incision and drainage severe abscess of multiple fascial planes Pathology/Specimens: ID Type Source Tests Collected by Time Destination  1 : LEFT FORE FOOT Tissue Foot, Left SURGICAL PATHOLOGY [name], DPM 03/25/2022 1951   A : LEFT FOOT ABCESS Abscess Wound AEROBIC/ANAEROBIC CULTURE W GRAM STAIN (SURGICAL/DEEP WOUND) [name], [facility] 03/25/2022 1928    Anesthesia: Monitored anesthesia care and regional ankle block Hemostasis:  Total Tourniquet Time Documented: Calf (Left) - 22 minutes Total: Calf (Left) - 22 minutes  Estimated Blood Loss: 10 mL Materials: * No implants in log * Medications: 20 cc 0.5% Marcaine plain Complications: No complication noted  Indications for Procedure:  This is a 61 y.o. 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  He was admitted to the ICU with sepsis.  Operation was recommended and transmetatarsal amputation was recommended.  I discussed this with the patient in detail and the for today before having any sedation, prior to surgical consent being written and signed he had sedation for his MRI, I again discussed this plan with the patient's next of kin his cousin [name] who consented to the procedure by telephone.  The circulator witnessed the consent.   Procedure in Detail: Patient was identified in  pre-operative holding area. Formal consent was signed and the left lower extremity was marked. Patient was brought back to the operating room. Anesthesia was induced. The extremity was prepped and draped in the usual sterile fashion. Timeout was taken to confirm patient name, laterality, and procedure prior to incision.   Attention was then directed to the left foot which exhibited severe necrosis of the distal forefoot.  The tourniquet was inflated without exsanguination.  Severe purulence was already found and a deep tissue culture was taken of this and sent to microbiology.  A guillotine amputation was started circumferentially around the forefoot.  I made my best attempt to salvage the intact soft tissue medially to use as a flap for later closure.  Severe necrosis was noted throughout the intermetatarsal spaces.  The metatarsals were exposed with a key elevator and blunt dissection and resected using a sagittal saw in their mid section.  The amputation was completed in the forefoot was passed for pathologic analysis.  At this point I then explored the plantar vault which continued to exhibit purulent drainage from the midfoot.  I expressed this out from the flexor tendon sheath and explored this manually with a hemostat and my fingers.  Approximate 5 to 10 cc of purulence was further found.  A counterincision was made in the tarsal tunnel along the long flexor tendon sheaths.  This was explored and all abscess was evacuated.  The entirety of the wound was then thoroughly irrigated with 3 L normal sterile saline with a pulse evacuator.  The wound was then partially closed with 2-0 nylon and a wound VAC was applied  The foot was then dressed with dry sterile  dressings and Ace wrap. Patient tolerated the procedure well.   Disposition: Following a period of post-operative monitoring, patient will be transferred to the floor.  This to be the first of a staged procedure he will need to return to the operating  room at a later date for further amputation or final closure.  I discussed with the family members that he is still at high risk of limb loss.

## 2022-03-25 NOTE — ED Notes (Signed)
I made the PA aware of the critical I stat Chem 8

## 2022-03-25 NOTE — Progress Notes (Signed)
Pharmacy Antibiotic Note  TORRE SCHAUMBURG is a 61 y.o. male admitted on 03/24/2022 with  wound infection .  Pharmacy has been consulted for Cefepime + Vancomycin dosing.  Xray of left foot + necrotizing fascitis and possible osteomyelitis of 5th digit.  Plan: Cefepime 2gm IV q8h Vancomycin 1gm IV q12h to target AUC 400-550 Check Vancomycin levels at steady state Monitor renal function and cx data   Height: '6\' 1"'$  (185.4 cm) Weight: 86.2 kg (190 lb) IBW/kg (Calculated) : 79.9  Temp (24hrs), Avg:98.6 F (37 C), Min:98.6 F (37 C), Max:98.6 F (37 C)  Recent Labs  Lab 03/24/22 0030 03/25/22 0030 03/25/22 0051 03/25/22 0151  WBC 11.2*  --   --   --   CREATININE  --  1.48*  --  1.30*  LATICACIDVEN  --   --  1.6  --     Estimated Creatinine Clearance: 67.4 mL/min (A) (by C-G formula based on SCr of 1.3 mg/dL (H)).    Allergies  Allergen Reactions   Prozac [Fluoxetine Hcl]     Headaches    Amlodipine Rash    Antimicrobials this admission: 6/6 Cefepime >>  6/6 Metronidazole >>  6/6 Vancomycin >>  Dose adjustments this admission:  Microbiology results: 6/6 BCx:  Thank you for allowing pharmacy to be a part of this patient's care.  Netta Cedars PharmD 03/25/2022 4:12 AM

## 2022-03-26 LAB — BASIC METABOLIC PANEL
Anion gap: 11 (ref 5–15)
BUN: 21 mg/dL (ref 8–23)
CO2: 20 mmol/L — ABNORMAL LOW (ref 22–32)
Calcium: 8.4 mg/dL — ABNORMAL LOW (ref 8.9–10.3)
Chloride: 104 mmol/L (ref 98–111)
Creatinine, Ser: 1.07 mg/dL (ref 0.61–1.24)
GFR, Estimated: >60 mL/min (ref >60)
Glucose, Bld: 147 mg/dL — ABNORMAL HIGH (ref 70–99)
Potassium: 2.8 mmol/L — ABNORMAL LOW (ref 3.5–5.1)
Sodium: 135 mmol/L (ref 135–145)

## 2022-03-26 LAB — GLUCOSE, CAPILLARY
Glucose-Capillary: 114 mg/dL — ABNORMAL HIGH (ref 70–99)
Glucose-Capillary: 147 mg/dL — ABNORMAL HIGH (ref 70–99)
Glucose-Capillary: 147 mg/dL — ABNORMAL HIGH (ref 70–99)
Glucose-Capillary: 162 mg/dL — ABNORMAL HIGH (ref 70–99)
Glucose-Capillary: 166 mg/dL — ABNORMAL HIGH (ref 70–99)
Glucose-Capillary: 174 mg/dL — ABNORMAL HIGH (ref 70–99)
Glucose-Capillary: 210 mg/dL — ABNORMAL HIGH (ref 70–99)
Glucose-Capillary: 211 mg/dL — ABNORMAL HIGH (ref 70–99)
Glucose-Capillary: 212 mg/dL — ABNORMAL HIGH (ref 70–99)
Glucose-Capillary: 237 mg/dL — ABNORMAL HIGH (ref 70–99)
Glucose-Capillary: 241 mg/dL — ABNORMAL HIGH (ref 70–99)
Glucose-Capillary: 261 mg/dL — ABNORMAL HIGH (ref 70–99)
Glucose-Capillary: 267 mg/dL — ABNORMAL HIGH (ref 70–99)
Glucose-Capillary: 381 mg/dL — ABNORMAL HIGH (ref 70–99)

## 2022-03-26 LAB — BETA-HYDROXYBUTYRIC ACID: Beta-Hydroxybutyric Acid: 0.26 mmol/L (ref 0.05–0.27)

## 2022-03-26 MED ORDER — POTASSIUM CHLORIDE 10 MEQ/100ML IV SOLN
10.0000 meq | INTRAVENOUS | Status: AC
  Administered 2022-03-26 (×6): 10 meq via INTRAVENOUS
  Filled 2022-03-26 (×5): qty 100

## 2022-03-26 MED ORDER — HYDRALAZINE HCL 20 MG/ML IJ SOLN: 10.0000 mg | INTRAMUSCULAR | Status: DC | PRN

## 2022-03-26 MED ORDER — JUVEN PO PACK
1.0000 | PACK | Freq: Two times a day (BID) | ORAL | Status: DC
  Administered 2022-03-26 – 2022-04-04 (×16): 1 via ORAL
  Filled 2022-03-26 (×13): qty 1

## 2022-03-26 MED ORDER — SODIUM CHLORIDE 0.9 % IV SOLN
INTRAVENOUS | Status: AC
Start: 2022-03-26 — End: 2022-03-27

## 2022-03-26 MED ORDER — IPRATROPIUM-ALBUTEROL 0.5-2.5 (3) MG/3ML IN SOLN: 3.0000 mL | RESPIRATORY_TRACT | Status: DC | PRN

## 2022-03-26 MED ORDER — SENNOSIDES-DOCUSATE SODIUM 8.6-50 MG PO TABS
1.0000 | ORAL_TABLET | Freq: Every evening | ORAL | Status: DC | PRN
  Administered 2022-03-27: 1 via ORAL
  Filled 2022-03-26: qty 1

## 2022-03-26 MED ORDER — OXYCODONE HCL 5 MG PO TABS
5.0000 mg | ORAL_TABLET | ORAL | Status: DC | PRN
  Administered 2022-03-26 – 2022-04-04 (×21): 5 mg via ORAL
  Filled 2022-03-26 (×21): qty 1

## 2022-03-26 MED ORDER — MAGNESIUM SULFATE 2 GM/50ML IV SOLN
2.0000 g | Freq: Once | INTRAVENOUS | Status: AC
  Administered 2022-03-26: 2 g via INTRAVENOUS
  Filled 2022-03-26: qty 50

## 2022-03-26 MED ORDER — METOPROLOL TARTRATE 5 MG/5ML IV SOLN
5.0000 mg | INTRAVENOUS | Status: DC | PRN
  Administered 2022-03-26 (×2): 5 mg via INTRAVENOUS
  Filled 2022-03-26 (×2): qty 5

## 2022-03-26 MED ORDER — INSULIN STARTER KIT- PEN NEEDLES (ENGLISH)
1.0000 | Freq: Once | Status: AC
Start: 2022-03-26 — End: 2022-03-26
  Administered 2022-03-26: 1
  Filled 2022-03-26: qty 1

## 2022-03-26 MED ORDER — INSULIN GLARGINE-YFGN 100 UNIT/ML ~~LOC~~ SOLN
15.0000 [IU] | Freq: Every day | SUBCUTANEOUS | Status: DC
  Administered 2022-03-26 – 2022-03-27 (×2): 15 [IU] via SUBCUTANEOUS
  Filled 2022-03-26 (×2): qty 0.15

## 2022-03-26 MED ORDER — INSULIN ASPART 100 UNIT/ML IJ SOLN
0.0000 [IU] | Freq: Three times a day (TID) | INTRAMUSCULAR | Status: DC
  Administered 2022-03-26: 3 [IU] via SUBCUTANEOUS
  Administered 2022-03-27: 9 [IU] via SUBCUTANEOUS

## 2022-03-26 MED ORDER — TRAZODONE HCL 50 MG PO TABS: 50.0000 mg | ORAL_TABLET | Freq: Every evening | ORAL | Status: DC | PRN

## 2022-03-26 MED ORDER — DM-GUAIFENESIN ER 30-600 MG PO TB12
1.0000 | ORAL_TABLET | Freq: Two times a day (BID) | ORAL | Status: DC | PRN
Start: 2022-03-26 — End: 2022-03-26

## 2022-03-26 MED ORDER — INSULIN ASPART 100 UNIT/ML IJ SOLN
0.0000 [IU] | Freq: Every day | INTRAMUSCULAR | Status: DC
  Administered 2022-03-26: 5 [IU] via SUBCUTANEOUS

## 2022-03-26 MED ORDER — GUAIFENESIN ER 600 MG PO TB12
1200.0000 mg | ORAL_TABLET | Freq: Two times a day (BID) | ORAL | Status: DC | PRN
Start: 2022-03-26 — End: 2022-04-04

## 2022-03-26 MED ORDER — LIVING WELL WITH DIABETES BOOK
Freq: Once | Status: AC
Start: 2022-03-26 — End: 2022-03-26
  Administered 2022-03-26: 1
  Filled 2022-03-26: qty 1

## 2022-03-26 MED ORDER — MELATONIN 5 MG PO TABS
5.0000 mg | ORAL_TABLET | Freq: Every evening | ORAL | Status: DC | PRN
Start: 2022-03-26 — End: 2022-04-04
  Administered 2022-03-27 – 2022-04-01 (×4): 5 mg via ORAL
  Filled 2022-03-26 (×4): qty 1

## 2022-03-26 MED ORDER — B COMPLEX-C PO TABS
1.0000 | ORAL_TABLET | Freq: Every day | ORAL | Status: DC
  Administered 2022-03-26 – 2022-04-04 (×10): 1 via ORAL
  Filled 2022-03-26 (×11): qty 1

## 2022-03-26 NOTE — Progress Notes (Signed)
PROGRESS NOTE    Robert Burnett  WHQ:[id number] DOB: [birth year] DOA: 03/24/2022 PCP: [name], MD   Brief Narrative:  61 y.o. male with medical history significant of seasonal allergies, unspecified anemia, DDD, asthma, cigarette smoker, type II DM, diabetic peripheral neuropathy, nonalcoholic fatty liver disease, GERD, hyperlipidemia, hypertension, history of pneumonia x3, urothelial cancer who is coming to the emergency department due to worsening of left foot wound for the past 2 weeks, but particularly a lot worse in the last 2 days that it has changing color and been draining a foul-smelling discharge after he kept the wrap for a week.  Upon admission patient was noted to be hyperglycemic with evidence of gangrene left foot, necrotizing infection.  Podiatry was consulted and he underwent transmetatarsal left amputation with I&D on 6/6.   Assessment & Plan:  Principal Problem:   Gangrene of left foot (HCC) Active Problems:   Diabetic peripheral neuropathy associated with type 2 diabetes mellitus (HCC)   Thoracic spondylosis with myelopathy   Type 2 diabetes mellitus with left diabetic foot infection (HCC)   Hypertension   Fatty liver disease, nonalcoholic   Pyogenic inflammation of bone (HCC)   Necrotizing soft tissue infection   Hypokalemia   Protein-calorie malnutrition, severe (HCC)   Abscess of tendon sheath, left ankle and foot     Severe left foot diabetic infection with wet gangrene and necrotizing infection with osteomyelitis Abscess noted in the area as well - Status post transmetatarsal amputation of the left foot with I&D by podiatry on 6/6. -Pain control - IV antibiotics-linezolid, Zosyn -ID consulted.   Diabetes mellitus type 2 with severe hyperglycemia - On home patient is on metformin 500 mg 3 times daily.  Hemoglobin A1c is 14.2.  He will likely need to be on insulin at home rather than metformin. -On Lipitor 80 mg daily.  Diabetic coordinator  consulted  Thoracic spondylosis with myelopathy - Pain control  Essential hypertension - Atenolol  Nonalcoholic fatty liver disease - Monitor LFTs  Hypokalemia - Replete as necessary  Severe protein calorie malnutrition - Nutrition evaluation  DVT prophylaxis: SCDs Start: 03/25/22 0411  Code Status: Full  Family Communication:  None  Status is: Inpatient Remains inpatient appropriate because: Maintain hospital stay on antibiotics, discharge once cleared by podiatry and infectious disease.  In the meantime ongoing management for his blood glucose.    Subjective: Tells me off-and-on has been noncompliant with his insulin at home.  No other complaints.  Examination:  General exam: Appears calm and comfortable  Respiratory system: Clear to auscultation. Respiratory effort normal. Cardiovascular system: S1 & S2 heard, RRR. No JVD, murmurs, rubs, gallops or clicks. No pedal edema. Gastrointestinal system: Abdomen is nondistended, soft and nontender. No organomegaly or masses felt. Normal bowel sounds heard. Central nervous system: Alert and oriented. No focal neurological deficits. Extremities: Symmetric 5 x 5 power. Skin: No rashes, lesions or ulcers Psychiatry: Judgement and insight appear normal. Mood & affect appropriate.     Objective: Vitals:   03/26/22 0600 03/26/22 0610 03/26/22 0645 03/26/22 0705  BP: (!) 95/48 (!) 103/44  (!) 105/48  Pulse: 73 74  78  Resp: '17 13  17  '$ Temp:   99.4 F (37.4 C)   TempSrc:   Oral   SpO2: 94% 95%  95%  Weight:      Height:        Intake/Output Summary (Last 24 hours) at 03/26/2022 0820 Last data filed at 03/26/2022 0630 Gross per 24 hour  Intake 5223.68 ml  Output 1110 ml  Net 4113.68 ml   Filed Weights   03/24/22 2346 03/25/22 0500 03/26/22 0500  Weight: 86.2 kg 77 kg 81.4 kg     Data Reviewed:   CBC: Recent Labs  Lab 03/24/22 0030 03/25/22 0151 03/25/22 1011  WBC 11.2*  --  11.9*  NEUTROABS 9.6*  --  10.0*   HGB 12.1* 12.6* 11.9*  HCT 34.8* 37.0* 35.4*  MCV 89.5  --  93.2  PLT 231  --  130   Basic Metabolic Panel: Recent Labs  Lab 03/25/22 0030 03/25/22 0151 03/25/22 1151 03/25/22 1643  NA 121* 123* 131* 133*  K 4.2 4.4 3.1* 3.1*  CL 84* 89* 96* 99  CO2 21*  --  22 24  GLUCOSE 646* 645* 252* 385*  BUN 51* 42* 44* 37*  CREATININE 1.48* 1.30* 1.21 1.05  CALCIUM 9.1  --  8.9 8.9  MG  --   --  1.8  --    GFR: Estimated Creatinine Clearance: 81.1 mL/min (by C-G formula based on SCr of 1.05 mg/dL). Liver Function Tests: Recent Labs  Lab 03/25/22 0030 03/25/22 1151  AST 27 19  ALT 29 22  ALKPHOS 100 71  BILITOT 0.9 0.8  PROT 8.2* 6.8  ALBUMIN 2.7* 2.3*   No results for input(s): LIPASE, AMYLASE in the last 168 hours. No results for input(s): AMMONIA in the last 168 hours. Coagulation Profile: No results for input(s): INR, PROTIME in the last 168 hours. Cardiac Enzymes: No results for input(s): CKTOTAL, CKMB, CKMBINDEX, TROPONINI in the last 168 hours. BNP (last 3 results) No results for input(s): PROBNP in the last 8760 hours. HbA1C: Recent Labs    03/25/22 1011  HGBA1C 14.2*   CBG: Recent Labs  Lab 03/26/22 0406 03/26/22 0510 03/26/22 0611 03/26/22 0706 03/26/22 0813  GLUCAP 211* 237* 147* 147* 162*   Lipid Profile: No results for input(s): CHOL, HDL, LDLCALC, TRIG, CHOLHDL, LDLDIRECT in the last 72 hours. Thyroid Function Tests: No results for input(s): TSH, T4TOTAL, FREET4, T3FREE, THYROIDAB in the last 72 hours. Anemia Panel: No results for input(s): VITAMINB12, FOLATE, FERRITIN, TIBC, IRON, RETICCTPCT in the last 72 hours. Sepsis Labs: Recent Labs  Lab 03/25/22 0051  LATICACIDVEN 1.6    Recent Results (from the past 240 hour(s))  Blood Cultures x 2 sites     Status: None (Preliminary result)   Collection Time: 03/25/22 12:30 AM   Specimen: BLOOD RIGHT WRIST  Result Value Ref Range Status   Specimen Description   Final    BLOOD RIGHT  WRIST Performed at [facility] [address], [place], [place] [address]    Special Requests   Final    BOTTLES DRAWN AEROBIC AND ANAEROBIC Blood Culture adequate volume Performed at [facility] [address] [place], [place] [address]    Culture  Setup Time   Final    GRAM POSITIVE COCCI IN BOTH AEROBIC AND ANAEROBIC BOTTLES CRITICAL RESULT CALLED TO, READ BACK BY AND VERIFIED WITH: PHARMD CHRISTINE SHADE ON 03/25/22 @ 1625 BY DRT Performed at [facility], [place] [address] [place],  [address]    Culture PENDING  Incomplete   Report Status PENDING  Incomplete  Blood Culture ID Panel (Reflexed)     Status: Abnormal   Collection Time: 03/25/22 12:30 AM  Result Value Ref Range Status   Enterococcus faecalis NOT DETECTED NOT DETECTED Final   Enterococcus Faecium NOT DETECTED NOT DETECTED Final  Listeria monocytogenes NOT DETECTED NOT DETECTED Final   Staphylococcus species NOT DETECTED NOT DETECTED Final   Staphylococcus aureus (BCID) NOT DETECTED NOT DETECTED Final   Staphylococcus epidermidis NOT DETECTED NOT DETECTED Final   Staphylococcus lugdunensis NOT DETECTED NOT DETECTED Final   Streptococcus species DETECTED (A) NOT DETECTED Final    Comment: CRITICAL RESULT CALLED TO, READ BACK BY AND VERIFIED WITH: PHARMD CHRISTINE SHADE ON 03/25/22 @ 1625 BY DRT    Streptococcus agalactiae DETECTED (A) NOT DETECTED Final    Comment: CRITICAL RESULT CALLED TO, READ BACK BY AND VERIFIED WITH: PHARMD CHRISTINE SHADE ON 03/25/22 @ 1625 BY DRT    Streptococcus pneumoniae NOT DETECTED NOT DETECTED Final   Streptococcus pyogenes NOT DETECTED NOT DETECTED Final   A.calcoaceticus-baumannii NOT DETECTED NOT DETECTED Final   Bacteroides fragilis NOT DETECTED NOT DETECTED Final   Enterobacterales NOT DETECTED NOT DETECTED Final   Enterobacter cloacae complex NOT DETECTED NOT DETECTED Final   Escherichia coli NOT DETECTED NOT DETECTED Final    Klebsiella aerogenes NOT DETECTED NOT DETECTED Final   Klebsiella oxytoca NOT DETECTED NOT DETECTED Final   Klebsiella pneumoniae NOT DETECTED NOT DETECTED Final   Proteus species NOT DETECTED NOT DETECTED Final   Salmonella species NOT DETECTED NOT DETECTED Final   Serratia marcescens NOT DETECTED NOT DETECTED Final   Haemophilus influenzae NOT DETECTED NOT DETECTED Final   Neisseria meningitidis NOT DETECTED NOT DETECTED Final   Pseudomonas aeruginosa NOT DETECTED NOT DETECTED Final   Stenotrophomonas maltophilia NOT DETECTED NOT DETECTED Final   Candida albicans NOT DETECTED NOT DETECTED Final   Candida auris NOT DETECTED NOT DETECTED Final   Candida glabrata NOT DETECTED NOT DETECTED Final   Candida krusei NOT DETECTED NOT DETECTED Final   Candida parapsilosis NOT DETECTED NOT DETECTED Final   Candida tropicalis NOT DETECTED NOT DETECTED Final   Cryptococcus neoformans/gattii NOT DETECTED NOT DETECTED Final    Comment: Performed at [facility], [place] [address], [place], [place] [address]  MRSA Next Gen by PCR, Nasal     Status: Abnormal   Collection Time: 03/25/22  5:48 AM   Specimen: Nasal Mucosa; Nasal Swab  Result Value Ref Range Status   MRSA by PCR Next Gen DETECTED (A) NOT DETECTED Final    Comment: (NOTE) The GeneXpert MRSA Assay (FDA approved for NASAL specimens only), is one component of a comprehensive MRSA colonization surveillance program. It is not intended to diagnose MRSA infection nor to guide or monitor treatment for MRSA infections. Test performance is not FDA approved in patients less than 61 years old. Performed at [facility], [place] [address], [place], [place] [address]   Aerobic/Anaerobic Culture w Gram Stain (surgical/deep wound)     Status: None (Preliminary result)   Collection Time: 03/25/22  7:28 PM   Specimen: Wound; Abscess  Result Value Ref Range Status   Specimen Description   Final    ABSCESS FOOT LEFT Performed at  [facility] [address] [place], [place] [address]    Special Requests   Final    NONE Performed at [facility], [place] [address], [place], [place] [address]    Gram Stain   Final    NO SQUAMOUS EPITHELIAL CELLS SEEN FEW WBC SEEN FEW GRAM POSITIVE RODS FEW GRAM NEGATIVE RODS FEW GRAM POSITIVE COCCI Performed at [facility], [place] [address], [place],  [address]    Culture PENDING  Incomplete   Report Status  PENDING  Incomplete         Radiology Studies: MR FOOT LEFT WO CONTRAST  Result Date: 03/25/2022 CLINICAL DATA:  Bone mass or bone pain, foot, aggressive features on xray EXAM: MRI OF THE LEFT FOOT WITHOUT CONTRAST TECHNIQUE: Multiplanar, multisequence MR imaging of the left forefoot was performed. No intravenous contrast was administered. COMPARISON:  X-ray 03/25/2022 FINDINGS: Severely motion degraded, essentially nondiagnostic exam. Uncooperative patient. Patient refused any further imaging. Partial second ray amputation. Decreased T1 marrow signal within the third toe and distal third metatarsal, and to a lesser degree within the distal fourth and fifth rays, evaluation markedly limited. Probable flexor tenosynovitis within the forefoot. Soft tissue swelling with wound or ulceration at the distal forefoot laterally. Numerous foci of susceptibility compatible with soft tissue gas as seen radiographically. IMPRESSION: 1. Severely motion degraded, essentially non-diagnostic exam. See above. 2. Osteomyelitis of the distal third, fourth, and fifth rays is not excluded. 3. Probable flexor tenosynovitis within the forefoot. 4. Soft tissue swelling with wound or ulceration at the distal forefoot laterally. Numerous foci of susceptibility compatible with soft tissue gas as seen radiographically. Electronically Signed   By: Davina Poke D.O.   On: 03/25/2022 14:24   DG CHEST PORT 1 VIEW  Result Date: 03/25/2022 CLINICAL DATA:   Preoperative evaluation EXAM: PORTABLE CHEST 1 VIEW COMPARISON:  2012 FINDINGS: The heart size and mediastinal contours are within normal limits. Both lungs are clear. No pleural effusion. The visualized skeletal structures are unremarkable. IMPRESSION: No acute process in the chest. Electronically Signed   By: Macy Mis M.D.   On: 03/25/2022 10:42   DG Foot Complete Left  Result Date: 03/25/2022 CLINICAL DATA:  Gangrene left foot. EXAM: LEFT FOOT - COMPLETE 3+ VIEW COMPARISON:  08/19/2021. FINDINGS: No acute fracture or dislocation. There has been amputation of the second digit and mid to distal second metatarsal. Degenerative changes are noted at the first metatarsophalangeal joint and midfoot. There is soft tissue swelling at the forefoot with subcutaneous air about the third, fourth, and fifth digits and distal metatarsals. Lucencies are noted in the base of the proximal phalanx of the first digit. IMPRESSION: 1. Soft tissue swelling at the forefoot with subcutaneous air involving the third, fourth, and fifth digits and distal metatarsals, concerning for necrotizing fasciitis. 2. Lucencies in the base of the proximal phalanx of the fifth digit, possible osteomyelitis versus artifact overlying gas in the soft tissues. Electronically Signed   By: Brett Fairy M.D.   On: 03/25/2022 01:27   VAS [place] ABI WITH/WO TBI  Result Date: 03/25/2022  LOWER EXTREMITY DOPPLER STUDY Patient Name:  Robert Burnett  Date of Exam:   03/25/2022 Medical Rec #: [id number]         Accession #:    [id number] Date of Birth: 1961/04/11          Patient Gender: M Patient Age:   37 years Exam Location:  [facility] Procedure:      VAS [place] ABI WITH/WO TBI Referring Phys: DAVID ORTIZ --------------------------------------------------------------------------------  Indications: Ulceration. High Risk Factors: Hypertension, Diabetes.  Limitations: Today's exam was limited due to an open wound, involuntary patient               movement and bandages. Comparison Study: No prior studies. Performing Technologist: Carlos Levering RVT  Examination Guidelines: A complete evaluation includes at minimum, Doppler waveform signals and systolic blood pressure reading at the level of bilateral brachial, anterior tibial, and posterior tibial arteries, when  vessel segments are accessible. Bilateral testing is considered an integral part of a complete examination. Photoelectric Plethysmograph (PPG) waveforms and toe systolic pressure readings are included as required and additional duplex testing as needed. Limited examinations for reoccurring indications may be performed as noted.  ABI Findings: +---------+------------------+-----+---------+--------+ Right    Rt Pressure (mmHg)IndexWaveform Comment  +---------+------------------+-----+---------+--------+ Brachial 113                    triphasic         +---------+------------------+-----+---------+--------+ PTA      140               1.19 triphasic         +---------+------------------+-----+---------+--------+ DP       121               1.03 triphasic         +---------+------------------+-----+---------+--------+ Great Toe68                0.58                   +---------+------------------+-----+---------+--------+ +---------+------------------+-----+---------+----------+ Left     Lt Pressure (mmHg)IndexWaveform Comment    +---------+------------------+-----+---------+----------+ Brachial 118                    triphasic           +---------+------------------+-----+---------+----------+ PTA      137               1.16 biphasic            +---------+------------------+-----+---------+----------+ DP       125               1.06 biphasic            +---------+------------------+-----+---------+----------+ Great Toe                                Ulceration +---------+------------------+-----+---------+----------+  Summary: Right: Resting right  ankle-brachial index is within normal range. No evidence of significant right lower extremity arterial disease. The right toe-brachial index is abnormal. Left: Resting left ankle-brachial index is within normal range. No evidence of significant left lower extremity arterial disease. Unable to obtain TBI due to great toe ulceration. *See table(s) above for measurements and observations.  Electronically signed by Servando Snare MD on 03/25/2022 at 9:23:23 PM.    Final         Scheduled Meds:  atenolol  100 mg Oral Daily   atorvastatin  80 mg Oral Daily   Chlorhexidine Gluconate Cloth  6 each Topical Daily   [START ON 03/27/2022] fentaNYL  1 patch Transdermal Q72H   finasteride  5 mg Oral Daily   mouth rinse  15 mL Mouth Rinse BID   mupirocin ointment   Nasal BID   Continuous Infusions:  dextrose 5% lactated ringers 125 mL/hr at 03/26/22 0615   insulin 3.8 Units/hr (03/26/22 0707)   lactated ringers Stopped (03/25/22 0220)   linezolid (ZYVOX) IV Stopped (03/25/22 2310)   piperacillin-tazobactam (ZOSYN)  IV 12.5 mL/hr at 03/26/22 0600   sodium chloride Stopped (03/25/22 2309)     LOS: 1 day   Time spent= 35 mins    Abdias Hickam Arsenio Loader, MD [facility]  If 7PM-7AM, please contact night-coverage  03/26/2022, 8:20 AM

## 2022-03-26 NOTE — Progress Notes (Signed)
Subjective: POD # 1 s/p transmetatarsal amputation left foot, I&D.  He is feeling better today.  No fevers or chills.  Objective: AAO x3, NAD Wound VAC intact 125 mmHg.  There is no significant bloody drainage along the bandage. No pain with calf compression, swelling, warmth, erythema  Assessment: POD # 1 s/p left TMA  Plan: Overall doing better today.  No CBC, ordered for tomorrow.  He is afebrile currently however still hypotensive.  For now continue broad-spectrum IV antibiotics.  Dr. Sherryle Lis can change the wound [facility] tomorrow and discuss further surgical intervention.  He has had 50 mL of fluid from the wound VAC per chart review.  Podiatry will continue to follow.  Celesta Gentile, DPM

## 2022-03-26 NOTE — TOC Initial Note (Signed)
Transition of Care [facility]) - Initial/Assessment Note    Patient Details  Name: Robert Burnett MRN: [id number] Date of Birth: [birth year]  [facility]) CM/SW Contact:    Leeroy Cha, RN Phone Number: 03/26/2022, 7:20 AM  Clinical Narrative:                  Transition of Care [facility]) Screening Note   Patient Details  Name: Robert Burnett Date of Birth: 1961-04-18   Transition of Care [facility]) CM/SW Contact:    Leeroy Cha, RN Phone Number: 03/26/2022, 7:20 AM    Transition of Care Department [facility]) has reviewed patient and no TOC needs have been identified at this time. We will continue to monitor patient advancement through interdisciplinary progression rounds. If new patient transition needs arise, please place a TOC consult.    Expected Discharge Plan: [place] Barriers to Discharge: Continued Medical Work up   Patient Goals and CMS Choice Patient states their goals for this hospitalization and ongoing recovery are:: to go hopme and get well CMS Medicare.gov Compare Post Acute Care list provided to:: Patient    Expected Discharge Plan and Services Expected Discharge Plan: [place]   Discharge Planning Services: CM Consult   Living arrangements for the past 2 months: Single Family Home                                      Prior Living Arrangements/Services Living arrangements for the past 2 months: Single Family Home Lives with:: Self Patient language and need for interpreter reviewed:: Yes Do you feel safe going back to the place where you live?: Yes            Criminal Activity/Legal Involvement Pertinent to Current Situation/Hospitalization: No - Comment as needed  Activities of Daily Living      Permission Sought/Granted                  Emotional Assessment Appearance:: Appears stated age Attitude/Demeanor/Rapport: Engaged Affect (typically observed): Calm Orientation: :  Oriented to Place, Oriented to Self, Oriented to  Time, Oriented to Situation Alcohol / Substance Use: Not Applicable Psych Involvement: No (comment)  Admission diagnosis:  Gangrene ([place]) [I96] Hyperglycemia [R73.9] Gangrene of left foot (HCC) [I96] Other osteomyelitis of left foot ([place]) [Y56.3S9] Patient Active Problem List   Diagnosis Date Noted   Gangrene of left foot ([place]) 03/25/2022   Hypokalemia 03/25/2022   Protein-calorie malnutrition, severe ([place]) 03/25/2022   Pyogenic inflammation of bone ([place])    Necrotizing soft tissue infection    Abscess of tendon sheath, left ankle and foot    Lateral epicondylitis of left elbow 08/01/2021   Ureteral cancer, 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   Proteinuria 09/19/2018   Hematuria 09/19/2018   Anterior cervical adenopathy 09/19/2018   Fatty liver disease, nonalcoholic 37/34/2876   Hypertension 06/27/2016   Thoracic spondylosis with myelopathy 03/16/2013   Carpal tunnel syndrome 03/16/2013   Chest wall pain, chronic 03/16/2013   Type 2 diabetes mellitus with left diabetic foot infection ([place]) 03/16/2013   Debility 03/16/2013   Chronic pain syndrome 03/16/2013   Lumbar degenerative disc disease 12/12/2011   Costochondritis 12/12/2011   Diabetic peripheral neuropathy associated with type 2 diabetes mellitus ([place]) 12/12/2011   PCP:  [name], MD Pharmacy:   [facility], [place] [address]  [place] [address] Phone: [phone] Fax: [phone]     Social Determinants of Health (SDOH) Interventions    Readmission Risk Interventions     View : No data to display.

## 2022-03-26 NOTE — Progress Notes (Signed)
Late entry: Pt was on insulin gtts ,5 unit via pump. Check CBG '@2033'$  as admit PACU from OR, result 230. Unable to put it in endo tool . Last check CBG was 261 @ 1912, due past 47mn, said confirm Pt . Did confirmed, stil, could not put it in. Recheck cbg before tx to unit, same as 230. Report given to ICU Nurse via phone.remains insulin gtts @ 5unit when tx to unit.

## 2022-03-26 NOTE — Progress Notes (Signed)
Report called to Precious Bard RN. All questions answered at this time. All pt belongings and paper chart transported with patient. Pt was transferred in the bed on tele by RN. Handoff completed.

## 2022-03-26 NOTE — Progress Notes (Addendum)
Initial Nutrition Assessment  DOCUMENTATION CODES:   Not applicable  INTERVENTION:  - will 1 tablet vitamin B complex with vitamin C/day.  - will order 1 packet Juven BID, each packet provides 95 calories, 2.5 grams of protein (collagen), and 9.8 grams of carbohydrate (3 grams sugar); also contains 7 grams of L-arginine and L-glutamine, 300 mg vitamin C, 15 mg vitamin E, 1.2 mcg vitamin B-12, 9.5 mg zinc, 200 mg calcium, and 1.5 g  Calcium Beta-hydroxy-Beta-methylbutyrate to support wound healing.  - will place Carbohydrate Counting for People with Diabetes handout in AVS.   NUTRITION DIAGNOSIS:   Increased nutrient needs related to acute illness, post-op healing, wound healing as evidenced by estimated needs.  GOAL:   Patient will meet greater than or equal to 90% of their needs  MONITOR:   PO intake, Supplement acceptance, Labs, Weight trends  REASON FOR ASSESSMENT:   Consult Wound healing  ASSESSMENT:   61 y.o. male with medical history of seasonal allergies, unspecified anemia, DDD, asthma, cigarette smoker, type 2 DM, diabetic peripheral neuropathy, nonalcoholic fatty liver disease, GERD, HLD, HTN, and urothelial cancer. He presented to the ED due to worsening L foot wound x2 weeks that significantly worsened in the 2 days PTA. In the ED he was noted to be hyperglycemic and have evidence of gangrene to L foot and necrotizing infection. Podiatry was consulted and he underwent transmetatarsal L amputation with I&D on 6/6.  Patient is POD #1 transmetatarsal amputation of L foot and I&D of severe abscess.  Patient sitting up in bed. No visitors present at the time of RD visit. Patient had received lunch shortly before. No meal intake from breakfast documented. Patient had consumed ~50% of lunch prior to RD visit and was feeling very hungry and was continuing intake of meal.  He shares that he lives alone and that d/t severe spinal pain it is difficult to get around. Provided  empathetic listening as patient shares activities such as fishing and hunting that used to bring him joy but that he has been unable to do in many years.  He shares that he does not consistently check CBGs. He shares that he had been better about doing so early into DM dx but that more recently he has not been as diligent. Patient recalls visit from DM Coordinator earlier today but is unable to recall specifics from the visit.   Patient reports that he did not eat much to nothing at all for the 4-5 days PTA d/t pain and overall feeling unwell d/t L foot infection. He reports that even prior to that, he was typically only eating 1 meal/day.  Talked with patient about Juven and B complex and he is very receptive to these ordered.  Weight today is 179 lb and PTA the most recently documented weight was 196 lb on 08/23/21.   Labs reviewed; HgbA1c: 14.2%, CBGs: 114-267 mg/dl since midnight, K: 2.8 mmol/l, Ca: 8.7 mg/dl.  Medications reviewed; sliding scale novolog, 15 units semglee/day, 2 g IV Mg sulfate x1 run 6/6 and x1 run 6/7, 10 mEq IV KCl x6 runs 6/7.  IVF; LR @ 125 ml/hr.    NUTRITION - FOCUSED PHYSICAL EXAM:  Flowsheet Row Most Recent Value  Orbital Region No depletion  Upper Arm Region Mild depletion  Thoracic and Lumbar Region Unable to assess  Buccal Region No depletion  Temple Region No depletion  Clavicle Bone Region No depletion  Clavicle and Acromion Bone Region Mild depletion  Scapular Bone Region No depletion  Dorsal Hand No depletion  Patellar Region No depletion  Anterior Thigh Region No depletion  Posterior Calf Region No depletion  Edema (RD Assessment) Mild  [BLE]  Hair Reviewed  Eyes Reviewed  Mouth Reviewed  Skin Reviewed  Nails Reviewed       Diet Order:   Diet Order             Diet Carb Modified Fluid consistency: Thin; Room service appropriate? Yes  Diet effective now                   EDUCATION NEEDS:   Education needs have been  addressed  Skin:  Skin Assessment: Skin Integrity Issues: Skin Integrity Issues:: Incisions, Wound VAC Wound Vac: L foot Incisions: L foot (6/6)  Last BM:  PTA/unknown  Height:   Ht Readings from Last 1 Encounters:  03/25/22 6' (1.829 m)    Weight:   Wt Readings from Last 1 Encounters:  03/26/22 81.4 kg     BMI:  Body mass index is 24.34 kg/m.  Estimated Nutritional Needs:  Kcal:  2200-2500 kcal Protein:  110-125 grams Fluid:  >/= 2.5 L/day     Robert Matin, MS, RD, LDN Registered Dietitian II Inpatient Clinical Nutrition RD pager # and on-call/weekend pager # available in [facility]

## 2022-03-26 NOTE — Progress Notes (Signed)
Patient's heart rhythm on the monitor changed into what appeared to be afib. EKG obtained per protocol, and afib RVR confirmed with HR up to 130 bpm. BP remains stable, currently 115/54. Dr Reesa Chew notified by this RN and PRN metoprolol given IVP. HR 114 bpm at this time, afib rhythm. This RN will continue to carefully monitor pt for changes.

## 2022-03-26 NOTE — Progress Notes (Signed)
Inpatient Diabetes Program Recommendations  AACE/ADA: New Consensus Statement on Inpatient Glycemic Control (2015)  Target Ranges:  Prepandial:   less than 140 mg/dL      Peak postprandial:   less than 180 mg/dL (1-2 hours)      Critically ill patients:  140 - 180 mg/dL   Lab Results  Component Value Date   GLUCAP 174 (H) 03/26/2022   HGBA1C 14.2 (H) 03/25/2022    Review of Glycemic Control  Diabetes history: DM2 Outpatient Diabetes medications: metformin 500 TID Current orders for Inpatient glycemic control: Transitioning off IV insulin to Semglee 15 units QD, Novolog 0-9 units TID with meals and 0-5 HS  HgbA1C - 14.2%  Spoke with pt at bedside regarding his diabetes and HgbA1C of 14.2%, indicating an average glucose of > 350 mg/dl over the past 2-3 months. Pt states he lost his insurance in January of 2023 and needs to get a PCP to manage his diabetes. Has glucose meter and supplies at home although has not been checking blood sugars. Will need affordable insulin for home. Reviewed glucose and HgbA1C goals. Discussed importance of controlling blood sugars to prevent further complications.   Reviewed signs and symptoms of hyperglycemia and hypoglycemia along with treatment for both. Discussed impact of nutrition, exercise, stress, sickness, and medications on diabetes control. Pt states he needs to buckle down and get serious with his blood sugar control. Pt open to going home on insulin. Continue with diabetes education. Will order LWWD book and insulin pen starter kit. Demonstrated insulin pen use and will have further discussion, demonstration and teach-back when starter kit available. TOC consult for assistance in obtaining PCP.    Inpatient Diabetes Program Recommendations:    For home:  Novolin ReliOn 70/30 14 units BID  Continue to follow.  Thank you. Lorenda Peck, RD, LDN, CDE Inpatient Diabetes Coordinator [phone]

## 2022-03-26 NOTE — Anesthesia Postprocedure Evaluation (Signed)
Anesthesia Post Note  Patient: Robert Burnett  Procedure(s) Performed: AMPUTATION DIGITS,TRANSMETARSAL AMPUTATION, REMOVAL OF TOES AND BONE BEHIND TOES  (Left: Foot) APPLICATION OF WOUND VAC (Left: Foot)     Patient location during evaluation: PACU Anesthesia Type: MAC Level of consciousness: awake and alert Pain management: pain level controlled Vital Signs Assessment: post-procedure vital signs reviewed and stable Respiratory status: spontaneous breathing, nonlabored ventilation, respiratory function stable and patient connected to nasal cannula oxygen Cardiovascular status: stable and blood pressure returned to baseline Postop Assessment: no apparent nausea or vomiting Anesthetic complications: no   No notable events documented.  Last Vitals:  Vitals:   03/26/22 0600 03/26/22 0610  BP: (!) 95/48 (!) 103/44  Pulse: 73 74  Resp: 17 13  Temp:    SpO2: 94% 95%    Last Pain:  Vitals:   03/26/22 0555  TempSrc:   PainSc: Asleep                 March Rummage Lunette Tapp

## 2022-03-26 NOTE — Progress Notes (Signed)
Insulin gtt and associated fluids stopped per orders. Long acting insulin was given at 1155. This RN will continue to carefully monitor patient's CBG.

## 2022-03-26 NOTE — Discharge Instructions (Addendum)
Carbohydrate Counting For People With Diabetes ° °Foods with carbohydrates make your blood glucose level go up. Learning how to count carbohydrates can help you control your blood glucose levels. First, identify the foods you eat that contain carbohydrates. Then, using the Foods with Carbohydrates chart, determine about how much carbohydrates are in your meals and snacks. Make sure you are eating foods with fiber, protein, and healthy fat along with your carbohydrate foods. °Foods with Carbohydrates °The following table shows carbohydrate foods that have about 15 grams of carbohydrate each. Using measuring cups, spoons, or a food scale when you first begin learning about carbohydrate counting can help you learn about the portion sizes you typically eat. °The following foods have 15 grams carbohydrate each:  °Grains °1 slice bread (1 ounce)  °1 small tortilla (6-inch size)  °¼ large bagel (1 ounce)  °1/3 cup pasta or rice (cooked)  °½ hamburger or hot dog bun (¾ ounce)  °½ cup cooked cereal  °½ to ¾ cup ready-to-eat cereal  °2 taco shells (5-inch size) Fruit °1 small fresh fruit (¾ to 1 cup)  °½ medium banana  °17 small grapes (3 ounces)  °1 cup melon or berries  °½ cup canned or frozen fruit  °2 tablespoons dried fruit (blueberries, cherries, cranberries, raisins)  °½ cup unsweetened fruit juice  °Starchy Vegetables °½ cup cooked beans, peas, corn, potatoes/sweet potatoes  °¼ large baked potato (3 ounces)  °1 cup acorn or butternut squash  Snack Foods °3 to 6 crackers  °8 potato chips or 13 tortilla chips (¾ ounce to 1 ounce)  °3 cups popped popcorn  °Dairy °3/4 cup (6 ounces) nonfat plain yogurt, or yogurt with sugar-free sweetener  °1 cup milk  °1 cup plain rice, soy, coconut or flavored almond milk Sweets and Desserts °½ cup ice cream or frozen yogurt  °1 tablespoon jam, jelly, pancake syrup, table sugar, or honey  °2 tablespoons light pancake syrup  °1 inch square of frosted cake or 2 inch square of unfrosted  cake  °2 small cookies (2/3 ounce each) or ¼ large cookie  °Sometimes you’ll have to estimate carbohydrate amounts if you don’t know the exact recipe. One cup of mixed foods like soups can have 1 to 2 carbohydrate servings, while some casseroles might have 2 or more servings of carbohydrate. °Foods that have less than 20 calories in each serving can be counted as “free” foods. Count 1 cup raw vegetables, or ½ cup cooked non-starchy vegetables as “free” foods. If you eat 3 or more servings at one meal, then count them as 1 carbohydrate serving.  °Foods without Carbohydrates  °Not all foods contain carbohydrates. Meat, some dairy, fats, non-starchy vegetables, and many beverages don’t contain carbohydrate. So when you count carbohydrates, you can generally exclude chicken, pork, beef, fish, seafood, eggs, tofu, cheese, butter, sour cream, avocado, nuts, seeds, olives, mayonnaise, water, black coffee, unsweetened tea, and zero-calorie drinks. Vegetables with no or low carbohydrate include green beans, cauliflower, tomatoes, and onions. °How much carbohydrate should I eat at each meal?  °Carbohydrate counting can help you plan your meals and manage your weight. Following are some starting points for carbohydrate intake at each meal. Work with your registered dietitian nutritionist to find the best range that works for your blood glucose and weight.  ° To Lose Weight To Maintain Weight  °Women 2 - 3 carb servings 3 - 4 carb servings  °Men 3 - 4 carb servings 4 - 5 carb servings  °Checking your   blood glucose after meals will help you know if you need to adjust the timing, type, or number of carbohydrate servings in your meal plan. Achieve and keep a healthy body weight by balancing your food intake and physical activity.  Tips How should I plan my meals?  Plan for half the food on your plate to include non-starchy vegetables, like salad greens, broccoli, or carrots. Try to eat 3 to 5 servings of non-starchy vegetables  every day. Have a protein food at each meal. Protein foods include chicken, fish, meat, eggs, or beans (note that beans contain carbohydrate). These two food groups (non-starchy vegetables and proteins) are low in carbohydrate. If you fill up your plate with these foods, you will eat less carbohydrate but still fill up your stomach. Try to limit your carbohydrate portion to  of the plate.  What fats are healthiest to eat?  Diabetes increases risk for heart disease. To help protect your heart, eat more healthy fats, such as olive oil, nuts, and avocado. Eat less saturated fats like butter, cream, and high-fat meats, like bacon and sausage. Avoid trans fats, which are in all foods that list "partially hydrogenated oil" as an ingredient. What should I drink?  Choose drinks that are not sweetened with sugar. The healthiest choices are water, carbonated or seltzer waters, and tea and coffee without added sugars.  Sweet drinks will make your blood glucose go up very quickly. One serving of soda or energy drink is  cup. It is best to drink these beverages only if your blood glucose is low.  Artificially sweetened, or diet drinks, typically do not increase your blood glucose if they have zero calories in them. Read labels of beverages, as some diet drinks do have carbohydrate and will raise your blood glucose. Label Reading Tips Read Nutrition Facts labels to find out how many grams of carbohydrate are in a food you want to eat. Don't forget: sometimes serving sizes on the label aren't the same as how much food you are going to eat, so you may need to calculate how much carbohydrate is in the food you are serving yourself.   Carbohydrate Counting for People with Diabetes Sample 1-Day Menu  Breakfast  cup yogurt, low fat, low sugar (1 carbohydrate serving)   cup cereal, ready-to-eat, unsweetened (1 carbohydrate serving)  1 cup strawberries (1 carbohydrate serving)   cup almonds ( carbohydrate serving)   Lunch 1, 5 ounce can chunk light tuna  2 ounces cheese, low fat cheddar  6 whole wheat crackers (1 carbohydrate serving)  1 small apple (1 carbohydrate servings)   cup carrots ( carbohydrate serving)   cup snap peas  1 cup 1% milk (1 carbohydrate serving)   Evening Meal Stir fry made with: 3 ounces chicken  1 cup brown rice (3 carbohydrate servings)   cup broccoli ( carbohydrate serving)   cup green beans   cup onions  1 tablespoon olive oil  2 tablespoons teriyaki sauce ( carbohydrate serving)  Evening Snack 1 extra small banana (1 carbohydrate serving)  1 tablespoon peanut butter   Carbohydrate Counting for People with Diabetes Vegan Sample 1-Day Menu  Breakfast 1 cup cooked oatmeal (2 carbohydrate servings)   cup blueberries (1 carbohydrate serving)  2 tablespoons flaxseeds  1 cup soymilk fortified with calcium and vitamin D  1 cup coffee  Lunch 2 slices whole wheat bread (2 carbohydrate servings)   cup baked tofu   cup lettuce  2 slices tomato  2 slices avocado     cup baby carrots ( carbohydrate serving)  1 orange (1 carbohydrate serving)  1 cup soymilk fortified with calcium and vitamin D   Evening Meal Burrito made with: 1 6-inch corn tortilla (1 carbohydrate serving)  1 cup refried vegetarian beans (2 carbohydrate servings)   cup chopped tomatoes   cup lettuce   cup salsa  1/3 cup brown rice (1 carbohydrate serving)  1 tablespoon olive oil for rice   cup zucchini   Evening Snack 6 small whole grain crackers (1 carbohydrate serving)  2 apricots ( carbohydrate serving)   cup unsalted peanuts ( carbohydrate serving)    Carbohydrate Counting for People with Diabetes Vegetarian (Lacto-Ovo) Sample 1-Day Menu  Breakfast 1 cup cooked oatmeal (2 carbohydrate servings)   cup blueberries (1 carbohydrate serving)  2 tablespoons flaxseeds  1 egg  1 cup 1% milk (1 carbohydrate serving)  1 cup coffee  Lunch 2 slices whole wheat bread (2 carbohydrate  servings)  2 ounces low-fat cheese   cup lettuce  2 slices tomato  2 slices avocado   cup baby carrots ( carbohydrate serving)  1 orange (1 carbohydrate serving)  1 cup unsweetened tea  Evening Meal Burrito made with: 1 6-inch corn tortilla (1 carbohydrate serving)   cup refried vegetarian beans (1 carbohydrate serving)   cup tomatoes   cup lettuce   cup salsa  1/3 cup brown rice (1 carbohydrate serving)  1 tablespoon olive oil for rice   cup zucchini  1 cup 1% milk (1 carbohydrate serving)  Evening Snack 6 small whole grain crackers (1 carbohydrate serving)  2 apricots ( carbohydrate serving)   cup unsalted peanuts ( carbohydrate serving)    Copyright 2020  Academy of Nutrition and Dietetics. All rights reserved.  Using Nutrition Labels: Carbohydrate  Serving Size  Look at the serving size. All the information on the label is based on this portion. Servings Per Container  The number of servings contained in the package. Guidelines for Carbohydrate  Look at the total grams of carbohydrate in the serving size.  1 carbohydrate choice = 15 grams of carbohydrate. Range of Carbohydrate Grams Per Choice  Carbohydrate Grams/Choice Carbohydrate Choices  6-10   11-20 1  21-25 1  26-35 2  36-40 2  41-50 3  51-55 3  56-65 4  66-70 4  71-80 5    Copyright 2020  Academy of Nutrition and Dietetics. All rights reserved.    [facility] Stay Proper nutrition can help your body recover from illness and injury.   Foods and beverages high in protein, vitamins, and minerals help rebuild muscle loss, promote healing, & reduce fall risk.   In addition to eating healthy foods, a nutrition shake is an easy, delicious way to get the nutrition you need during and after your hospital stay  It is recommended that you continue to drink 2 bottles per day of: Glucerna Shake or Premier Protein for at least 1 month (30 days) after your hospital stay   Tips for  adding a nutrition shake into your routine: As allowed, drink one with vitamins or medications instead of water or juice Enjoy one as a tasty mid-morning or afternoon snack Drink cold or make a milkshake out of it Drink one instead of milk with cereal or snacks Use as a coffee creamer   Available at the following grocery stores and pharmacies:           * [employer]          * [employer] * [employer]  * [facility]      * Target  * [employer]   * [facility]  * [place] visit: www.ensure.com/join or http://dawson-may.com/   Suggested Substitutions Ensure Plus = Boost Plus = Carnation Breakfast Essentials = Boost Compact Ensure Active Clear = Boost Breeze Glucerna Shake = Boost Glucose Control = Carnation Breakfast Essentials SUGAR FREE    Please follow-up with podiatry Dr. Sherryle Lis.Do not change dressings till seen by podiatry.  Dr. Sherryle Lis will change dressings when he see you in the office.

## 2022-03-27 ENCOUNTER — Inpatient Hospital Stay (HOSPITAL_COMMUNITY): Payer: Self-pay

## 2022-03-27 ENCOUNTER — Encounter (HOSPITAL_COMMUNITY): Payer: Self-pay | Admitting: Podiatry

## 2022-03-27 DIAGNOSIS — R7881 Bacteremia: Secondary | ICD-10-CM

## 2022-03-27 LAB — CBC
HCT: 27.9 % — ABNORMAL LOW (ref 39.0–52.0)
Hemoglobin: 9.3 g/dL — ABNORMAL LOW (ref 13.0–17.0)
MCH: 30.9 pg (ref 26.0–34.0)
MCHC: 33.3 g/dL (ref 30.0–36.0)
MCV: 92.7 fL (ref 80.0–100.0)
Platelets: 142 10*3/uL — ABNORMAL LOW (ref 150–400)
RBC: 3.01 MIL/uL — ABNORMAL LOW (ref 4.22–5.81)
RDW: 12.3 % (ref 11.5–15.5)
WBC: 6.5 10*3/uL (ref 4.0–10.5)
nRBC: 0 % (ref 0.0–0.2)

## 2022-03-27 LAB — BASIC METABOLIC PANEL
Anion gap: 9 (ref 5–15)
BUN: 24 mg/dL — ABNORMAL HIGH (ref 8–23)
CO2: 23 mmol/L (ref 22–32)
Calcium: 8 mg/dL — ABNORMAL LOW (ref 8.9–10.3)
Chloride: 99 mmol/L (ref 98–111)
Creatinine, Ser: 1.3 mg/dL — ABNORMAL HIGH (ref 0.61–1.24)
GFR, Estimated: >60 mL/min (ref >60)
Glucose, Bld: 385 mg/dL — ABNORMAL HIGH (ref 70–99)
Potassium: 3.5 mmol/L (ref 3.5–5.1)
Sodium: 131 mmol/L — ABNORMAL LOW (ref 135–145)

## 2022-03-27 LAB — CULTURE, BLOOD (ROUTINE X 2): Special Requests: ADEQUATE

## 2022-03-27 LAB — GLUCOSE, CAPILLARY
Glucose-Capillary: 379 mg/dL — ABNORMAL HIGH (ref 70–99)
Glucose-Capillary: 405 mg/dL — ABNORMAL HIGH (ref 70–99)
Glucose-Capillary: 372 mg/dL — ABNORMAL HIGH (ref 70–99)
Glucose-Capillary: 297 mg/dL — ABNORMAL HIGH (ref 70–99)

## 2022-03-27 LAB — ECHOCARDIOGRAM COMPLETE
AR max vel: 3.14 cm2
AV Peak grad: 7.1 mmHg
Ao pk vel: 1.34 m/s
Area-P 1/2: 4.57 cm2
Height: 72 in
MV M vel: 4.23 m/s
MV Peak grad: 71.6 mmHg
S' Lateral: 3.8 cm
Weight: 2871.27 oz

## 2022-03-27 LAB — MAGNESIUM: Magnesium: 1.9 mg/dL (ref 1.7–2.4)

## 2022-03-27 MED ORDER — INSULIN ASPART 100 UNIT/ML IJ SOLN
0.0000 [IU] | Freq: Every day | INTRAMUSCULAR | Status: DC
  Administered 2022-03-27: 3 [IU] via SUBCUTANEOUS

## 2022-03-27 MED ORDER — INSULIN ASPART 100 UNIT/ML IJ SOLN
12.0000 [IU] | Freq: Once | INTRAMUSCULAR | Status: AC
Start: 2022-03-27 — End: 2022-03-27
  Administered 2022-03-27: 12 [IU] via SUBCUTANEOUS

## 2022-03-27 MED ORDER — INSULIN ASPART 100 UNIT/ML IJ SOLN
0.0000 [IU] | Freq: Three times a day (TID) | INTRAMUSCULAR | Status: DC
  Administered 2022-03-27: 15 [IU] via SUBCUTANEOUS
  Administered 2022-03-28: 2 [IU] via SUBCUTANEOUS
  Administered 2022-03-28 (×2): 8 [IU] via SUBCUTANEOUS
  Administered 2022-03-29 (×2): 15 [IU] via SUBCUTANEOUS

## 2022-03-27 MED ORDER — MAGNESIUM OXIDE -MG SUPPLEMENT 400 (240 MG) MG PO TABS
800.0000 mg | ORAL_TABLET | Freq: Once | ORAL | Status: AC
  Administered 2022-03-27: 800 mg via ORAL
  Filled 2022-03-27: qty 2

## 2022-03-27 MED ORDER — POTASSIUM CHLORIDE 10 MEQ/100ML IV SOLN
10.0000 meq | INTRAVENOUS | Status: AC
  Administered 2022-03-27 (×4): 10 meq via INTRAVENOUS
  Filled 2022-03-27 (×4): qty 100

## 2022-03-27 MED ORDER — INSULIN GLARGINE-YFGN 100 UNIT/ML ~~LOC~~ SOLN
25.0000 [IU] | Freq: Every day | SUBCUTANEOUS | Status: DC
  Filled 2022-03-27: qty 0.25

## 2022-03-27 MED ORDER — PENICILLIN G POTASSIUM 20000000 UNITS IJ SOLR
12.0000 10*6.[IU] | Freq: Two times a day (BID) | INTRAVENOUS | Status: DC
  Administered 2022-03-27 – 2022-04-04 (×15): 12 10*6.[IU] via INTRAVENOUS
  Filled 2022-03-27 (×17): qty 12

## 2022-03-27 MED ORDER — INSULIN GLARGINE-YFGN 100 UNIT/ML ~~LOC~~ SOLN
10.0000 [IU] | Freq: Once | SUBCUTANEOUS | Status: AC
  Administered 2022-03-27: 10 [IU] via SUBCUTANEOUS
  Filled 2022-03-27: qty 0.1

## 2022-03-27 NOTE — Progress Notes (Signed)
  Subjective:  Patient ID: Robert Burnett, male    DOB: 1961/02/23,  MRN: [id number]  Feeling okay so far much better than when he came in he says  Negative for chest pain and shortness of breath Fever: no Constitutional signs: no Review of all other systems is negative Objective:   Vitals:   03/27/22 0056 03/27/22 0548  BP: (!) 113/59 105/65  Pulse: 78 79  Resp: 18 16  Temp: 98.6 F (37 C) 98.6 F (37 C)  SpO2: 100% 94%   General AA&O x3. Normal mood and affect.  Vascular Dorsalis pedis and posterior tibial pulses 2/4 bilat. Brisk capillary refill to all digits. Pedal hair present.  Neurologic Epicritic sensation grossly intact.  Dermatologic Flap maintaining perfusion there is some maceration and bloody drainage.  Apex of corner appears somewhat dysvascular at rotation site, no purulence malodor or new signs of infection  Orthopedic: MMT 5/5 in dorsiflexion, plantarflexion, inversion, and eversion. Normal joint ROM without pain or crepitus.          Assessment & Plan:  Patient was evaluated and treated and all questions answered.  Severe diabetic foot infection left foot -I reviewed all remaining options for this.  I do think he has a chance at limb salvage as he is able to stop smoking and get his blood sugar under control.  I recommend we proceed and return to the OR tomorrow for wound closure he may require a full Lisfranc disarticulation.  An amputation proximal to this would likely be better served with a BKA so we will not go any further than this.  Should be able to have decent soft tissue coverage to move the medial flap to lateral.  Expect there will be a remaining deficit still and we will plan to address this with a skin substitute such as Integra and wound VAC.  Discontinue wound VAC for now may reinforce or change dressing if there is strikethrough  N.p.o. past midnight  Plan for surgery tomorrow after 5 PM  [name], DPM  Accessible via secure  chat for questions or concerns.

## 2022-03-27 NOTE — Progress Notes (Signed)
Inpatient Diabetes Program Recommendations  AACE/ADA: New Consensus Statement on Inpatient Glycemic Control (2015)  Target Ranges:  Prepandial:   less than 140 mg/dL      Peak postprandial:   less than 180 mg/dL (1-2 hours)      Critically ill patients:  140 - 180 mg/dL   Lab Results  Component Value Date   GLUCAP 297 (H) 03/27/2022   HGBA1C 14.2 (H) 03/25/2022    Review of Glycemic Control  Diabetes history: DM2 Outpatient Diabetes medications: metformin 500 TID Current orders for Inpatient glycemic control: Semglee 25 QD, Novolog 0-15 units TID with meals and 0-5 HS  HgbA1C - 14.2% NPO after MN for surgery  Inpatient Diabetes Program Recommendations:    While NPO, change Novolog 0-15 units to Q4H.  Educated patient on insulin pen use at home. Reviewed contents of insulin flexpen starter kit. Reviewed all steps if insulin pen including attachment of needle, 2-unit air shot, dialing up dose, giving injection, removing needle, disposal of sharps, storage of unused insulin, disposal of insulin etc. Patient able to provide successful return demonstration. Also reviewed troubleshooting with insulin pen. MD to give patient Rxs for insulin pens and insulin pen needles.  RN allowing pt to give his own insulin injections. Pt seems to be interested in ongoing diabetes education. Questions answered.  Follow closely. Needs tight glycemic control for healing.  Thank you. Lorenda Peck, RD, LDN, CDE Inpatient Diabetes Coordinator [phone]

## 2022-03-27 NOTE — TOC Progression Note (Addendum)
Transition of Care [facility]) - Progression Note    Patient Details  Name: Robert Burnett MRN: [id number] Date of Birth: Feb 20, 1961  [facility] CM/SW Contact  Leeroy Cha, RN Phone Number: 03/27/2022, 9:39 AM  Clinical Narrative:     Tentative appointment set with [facility] and wellness. Rn wcb with actual appointment time within the next 24-48 hrs.  July 12 at 0930 am. Expected Discharge Plan: [place] Barriers to Discharge: Continued Medical Work up  Expected Discharge Plan and Services Expected Discharge Plan: [place]   Discharge Planning Services: CM Consult   Living arrangements for the past 2 months: Single Family Home                                       Social Determinants of Health (SDOH) Interventions    Readmission Risk Interventions     No data to display

## 2022-03-27 NOTE — Progress Notes (Signed)
PROGRESS NOTE    Robert Burnett  HDQ:[id number] DOB: July 31, 1961 DOA: 03/24/2022 PCP: [name], MD   Brief Narrative:  61 y.o. male with medical history significant of seasonal allergies, unspecified anemia, DDD, asthma, cigarette smoker, type II DM, diabetic peripheral neuropathy, nonalcoholic fatty liver disease, GERD, hyperlipidemia, hypertension, history of pneumonia x3, urothelial cancer who is coming to the emergency department due to worsening of left foot wound for the past 2 weeks, but particularly a lot worse in the last 2 days that it has changing color and been draining a foul-smelling discharge after he kept the wrap for a week.  Upon admission patient was noted to be hyperglycemic with evidence of gangrene left foot, necrotizing infection.  Podiatry was consulted and he underwent transmetatarsal left amputation with I&D on 6/6.  Assessment & Plan:  Principal Problem:   Gangrene of left foot (HCC) Active Problems:   Diabetic peripheral neuropathy associated with type 2 diabetes mellitus (HCC)   Thoracic spondylosis with myelopathy   Type 2 diabetes mellitus with left diabetic foot infection (HCC)   Hypertension   Fatty liver disease, nonalcoholic   Pyogenic inflammation of bone (HCC)   Necrotizing soft tissue infection   Hypokalemia   Protein-calorie malnutrition, severe (HCC)   Abscess of tendon sheath, left ankle and foot     Severe left foot diabetic infection with wet gangrene and necrotizing infection with osteomyelitis Abscess noted in the area as well - Status post transmetatarsal amputation of the left foot with I&D by podiatry on 6/6.  N.p.o. past midnight for further debridement/amputation tomorrow by podiatry -Pain control - IV antibiotics-linezolid, Zosyn -ID consulted.  -[place] ABI - normal. No further vascular eval for now, can follow up outptn .  Group B strep bacteremia - Continue current IV antibiotics.  ID is following.  Echocardiogram and repeat  cultures ordered.  Diabetes mellitus type 2 with severe hyperglycemia - On home patient is on metformin 500 mg 3 times daily.  Hemoglobin A1c is 14.2.  Will increase Semglee to 25 units daily, will give additional 10 units right now.  Change sliding scale to moderate resistant.  Continue to adjust insulin as necessary.  Thoracic spondylosis with myelopathy - Pain control  Essential hypertension - Atenolol  Nonalcoholic fatty liver disease - Monitor LFTs  Hypokalemia - Replete as necessary  Severe protein calorie malnutrition - Nutrition evaluation  DVT prophylaxis: SCDs Start: 03/25/22 0411  Code Status: Full  Family Communication:  Eino Farber May, Answered by [facility]? Who introduced to me as his wife first later said she was the cousin. I told her I don't have patns permission to speak with her. She states she was an Therapist, sports and understands HIPAA.  [name] was busy therefore couldn't come to the phone.   Status is: Inpatient Remains inpatient appropriate because: Maintain hospital stay on antibiotics, discharge once cleared by podiatry and infectious disease.  In the meantime ongoing management for his blood glucose.  Subjective: Feels ok, no complaints during my visit besides aches and pain in his LE at the surgical site.   Examination:  Constitutional: Not in acute distress Respiratory: Clear to auscultation bilaterally Cardiovascular: Normal sinus rhythm, no rubs Abdomen: Nontender nondistended good bowel sounds Musculoskeletal: No edema noted Skin: No rashes seen Neurologic: CN 2-12 grossly intact.  And nonfocal Psychiatric: Normal judgment and insight. Alert and oriented x 3. Normal mood.  Surgical site of the foot noted, dressing in place.   Objective: Vitals:   03/26/22 2052 03/27/22 0056  03/27/22 0548 03/27/22 0913  BP: 101/69 (!) 113/59 105/65 114/71  Pulse: 83 78 79   Resp: '16 18 16 18  '$ Temp: 98.4 F (36.9 C) 98.6 F (37 C) 98.6 F (37 C)   TempSrc: Oral  Oral Oral   SpO2: 93% 100% 94%   Weight:      Height:        Intake/Output Summary (Last 24 hours) at 03/27/2022 1232 Last data filed at 03/27/2022 0925 Gross per 24 hour  Intake 3189.13 ml  Output 1200 ml  Net 1989.13 ml   Filed Weights   03/24/22 2346 03/25/22 0500 03/26/22 0500  Weight: 86.2 kg 77 kg 81.4 kg     Data Reviewed:   CBC: Recent Labs  Lab 03/24/22 0030 03/25/22 0151 03/25/22 1011 03/27/22 0421  WBC 11.2*  --  11.9* 6.5  NEUTROABS 9.6*  --  10.0*  --   HGB 12.1* 12.6* 11.9* 9.3*  HCT 34.8* 37.0* 35.4* 27.9*  MCV 89.5  --  93.2 92.7  PLT 231  --  259 591*   Basic Metabolic Panel: Recent Labs  Lab 03/25/22 0030 03/25/22 0151 03/25/22 1151 03/25/22 1643 03/26/22 0816 03/27/22 0421  NA 121* 123* 131* 133* 135 131*  K 4.2 4.4 3.1* 3.1* 2.8* 3.5  CL 84* 89* 96* 99 104 99  CO2 21*  --  22 24 20* 23  GLUCOSE 646* 645* 252* 385* 147* 385*  BUN 51* 42* 44* 37* 21 24*  CREATININE 1.48* 1.30* 1.21 1.05 1.07 1.30*  CALCIUM 9.1  --  8.9 8.9 8.4* 8.0*  MG  --   --  1.8  --   --  1.9   GFR: Estimated Creatinine Clearance: 65.5 mL/min (A) (by C-G formula based on SCr of 1.3 mg/dL (H)). Liver Function Tests: Recent Labs  Lab 03/25/22 0030 03/25/22 1151  AST 27 19  ALT 29 22  ALKPHOS 100 71  BILITOT 0.9 0.8  PROT 8.2* 6.8  ALBUMIN 2.7* 2.3*   No results for input(s): "LIPASE", "AMYLASE" in the last 168 hours. No results for input(s): "AMMONIA" in the last 168 hours. Coagulation Profile: No results for input(s): "INR", "PROTIME" in the last 168 hours. Cardiac Enzymes: No results for input(s): "CKTOTAL", "CKMB", "CKMBINDEX", "TROPONINI" in the last 168 hours. BNP (last 3 results) No results for input(s): "PROBNP" in the last 8760 hours. HbA1C: Recent Labs    03/25/22 1011  HGBA1C 14.2*   CBG: Recent Labs  Lab 03/26/22 1348 03/26/22 1629 03/26/22 2057 03/27/22 0737 03/27/22 1158  GLUCAP 114* 212* 381* 379* 405*   Lipid Profile: No  results for input(s): "CHOL", "HDL", "LDLCALC", "TRIG", "CHOLHDL", "LDLDIRECT" in the last 72 hours. Thyroid Function Tests: No results for input(s): "TSH", "T4TOTAL", "FREET4", "T3FREE", "THYROIDAB" in the last 72 hours. Anemia Panel: No results for input(s): "VITAMINB12", "FOLATE", "FERRITIN", "TIBC", "IRON", "RETICCTPCT" in the last 72 hours. Sepsis Labs: Recent Labs  Lab 03/25/22 0051  LATICACIDVEN 1.6    Recent Results (from the past 240 hour(s))  Blood Cultures x 2 sites     Status: Abnormal   Collection Time: 03/25/22 12:30 AM   Specimen: BLOOD RIGHT WRIST  Result Value Ref Range Status   Specimen Description   Final    BLOOD RIGHT WRIST Performed at [facility] [address] [place], [place] [address]    Special Requests   Final    BOTTLES DRAWN AEROBIC AND ANAEROBIC Blood Culture adequate volume Performed at [facility],  [place] [address], [place], [place] [address]    Culture  Setup Time   Final    GRAM POSITIVE COCCI IN BOTH AEROBIC AND ANAEROBIC BOTTLES CRITICAL RESULT CALLED TO, READ BACK BY AND VERIFIED WITH: PHARMD CHRISTINE SHADE ON 03/25/22 @ 1625 BY DRT Performed at [facility], [place] [address], [place], [place] [address]    Culture GROUP B STREP(S.AGALACTIAE)ISOLATED (A)  Final   Report Status 03/27/2022 FINAL  Final   Organism ID, Bacteria GROUP B STREP(S.AGALACTIAE)ISOLATED  Final      Susceptibility   Group b strep(s.agalactiae)isolated - MIC*    CLINDAMYCIN >=1 RESISTANT Resistant     AMPICILLIN <=0.25 SENSITIVE Sensitive     ERYTHROMYCIN >=8 RESISTANT Resistant     VANCOMYCIN 0.5 SENSITIVE Sensitive     CEFTRIAXONE <=0.12 SENSITIVE Sensitive     LEVOFLOXACIN 0.5 SENSITIVE Sensitive     * GROUP B STREP(S.AGALACTIAE)ISOLATED  Blood Culture ID Panel (Reflexed)     Status: Abnormal   Collection Time: 03/25/22 12:30 AM  Result Value Ref Range Status   Enterococcus faecalis NOT DETECTED NOT DETECTED Final    Enterococcus Faecium NOT DETECTED NOT DETECTED Final   Listeria monocytogenes NOT DETECTED NOT DETECTED Final   Staphylococcus species NOT DETECTED NOT DETECTED Final   Staphylococcus aureus (BCID) NOT DETECTED NOT DETECTED Final   Staphylococcus epidermidis NOT DETECTED NOT DETECTED Final   Staphylococcus lugdunensis NOT DETECTED NOT DETECTED Final   Streptococcus species DETECTED (A) NOT DETECTED Final    Comment: CRITICAL RESULT CALLED TO, READ BACK BY AND VERIFIED WITH: PHARMD CHRISTINE SHADE ON 03/25/22 @ 1625 BY DRT    Streptococcus agalactiae DETECTED (A) NOT DETECTED Final    Comment: CRITICAL RESULT CALLED TO, READ BACK BY AND VERIFIED WITH: PHARMD CHRISTINE SHADE ON 03/25/22 @ 1625 BY DRT    Streptococcus pneumoniae NOT DETECTED NOT DETECTED Final   Streptococcus pyogenes NOT DETECTED NOT DETECTED Final   A.calcoaceticus-baumannii NOT DETECTED NOT DETECTED Final   Bacteroides fragilis NOT DETECTED NOT DETECTED Final   Enterobacterales NOT DETECTED NOT DETECTED Final   Enterobacter cloacae complex NOT DETECTED NOT DETECTED Final   Escherichia coli NOT DETECTED NOT DETECTED Final   Klebsiella aerogenes NOT DETECTED NOT DETECTED Final   Klebsiella oxytoca NOT DETECTED NOT DETECTED Final   Klebsiella pneumoniae NOT DETECTED NOT DETECTED Final   Proteus species NOT DETECTED NOT DETECTED Final   Salmonella species NOT DETECTED NOT DETECTED Final   Serratia marcescens NOT DETECTED NOT DETECTED Final   Haemophilus influenzae NOT DETECTED NOT DETECTED Final   Neisseria meningitidis NOT DETECTED NOT DETECTED Final   Pseudomonas aeruginosa NOT DETECTED NOT DETECTED Final   Stenotrophomonas maltophilia NOT DETECTED NOT DETECTED Final   Candida albicans NOT DETECTED NOT DETECTED Final   Candida auris NOT DETECTED NOT DETECTED Final   Candida glabrata NOT DETECTED NOT DETECTED Final   Candida krusei NOT DETECTED NOT DETECTED Final   Candida parapsilosis NOT DETECTED NOT DETECTED Final    Candida tropicalis NOT DETECTED NOT DETECTED Final   Cryptococcus neoformans/gattii NOT DETECTED NOT DETECTED Final    Comment: Performed at [facility], [place] [address], [place], [place] [address]  Blood Cultures x 2 sites     Status: Abnormal   Collection Time: 03/25/22 12:41 AM   Specimen: BLOOD LEFT HAND  Result Value Ref Range Status   Specimen Description   Final    BLOOD LEFT HAND Performed at [facility] [address]  [place], [place] [address]    Special Requests   Final    BOTTLES DRAWN AEROBIC ONLY Blood Culture results may not be optimal due to an inadequate volume of blood received in culture bottles Performed at [facility] [address], [place], [place] [address]    Culture (A)  Final    GROUP B STREP(S.AGALACTIAE)ISOLATED SUSCEPTIBILITIES PERFORMED ON PREVIOUS CULTURE WITHIN THE LAST 5 DAYS. Performed at [facility], [place] [address], [place], [place] [address]    Report Status 03/27/2022 FINAL  Final  MRSA Next Gen by PCR, Nasal     Status: Abnormal   Collection Time: 03/25/22  5:48 AM   Specimen: Nasal Mucosa; Nasal Swab  Result Value Ref Range Status   MRSA by PCR Next Gen DETECTED (A) NOT DETECTED Final    Comment: (NOTE) The GeneXpert MRSA Assay (FDA approved for NASAL specimens only), is one component of a comprehensive MRSA colonization surveillance program. It is not intended to diagnose MRSA infection nor to guide or monitor treatment for MRSA infections. Test performance is not FDA approved in patients less than 64 years old. Performed at [facility], [place] [address], [place], [place] [address]   Aerobic/Anaerobic Culture w Gram Stain (surgical/deep wound)     Status: Abnormal (Preliminary result)   Collection Time: 03/25/22  7:28 PM   Specimen: Wound; Abscess  Result Value Ref Range Status   Specimen Description   Final    ABSCESS FOOT LEFT Performed at [facility] [address] [place], [place] [address]    Special Requests   Final    NONE Performed at [facility], [place] [address], [place], [place] [address]    Gram Stain   Final    NO SQUAMOUS EPITHELIAL CELLS SEEN FEW WBC SEEN FEW GRAM POSITIVE RODS FEW GRAM NEGATIVE RODS FEW GRAM POSITIVE COCCI Performed at [facility], [place] [address], [place],  [address]    Culture (A)  Final    MULTIPLE ORGANISMS PRESENT, NONE PREDOMINANT NO ANAEROBES ISOLATED; CULTURE IN PROGRESS FOR 5 DAYS    Report Status PENDING  Incomplete         Radiology Studies: MR FOOT LEFT WO CONTRAST  Result Date: 03/25/2022 CLINICAL DATA:  Bone mass or bone pain, foot, aggressive features on xray EXAM: MRI OF THE LEFT FOOT WITHOUT CONTRAST TECHNIQUE: Multiplanar, multisequence MR imaging of the left forefoot was performed. No intravenous contrast was administered. COMPARISON:  X-ray 03/25/2022 FINDINGS: Severely motion degraded, essentially nondiagnostic exam. Uncooperative patient. Patient refused any further imaging. Partial second ray amputation. Decreased T1 marrow signal within the third toe and distal third metatarsal, and to a lesser degree within the distal fourth and fifth rays, evaluation markedly limited. Probable flexor tenosynovitis within the forefoot. Soft tissue swelling with wound or ulceration at the distal forefoot laterally. Numerous foci of susceptibility compatible with soft tissue gas as seen radiographically. IMPRESSION: 1. Severely motion degraded, essentially non-diagnostic exam. See above. 2. Osteomyelitis of the distal third, fourth, and fifth rays is not excluded. 3. Probable flexor tenosynovitis within the forefoot. 4. Soft tissue swelling with wound or ulceration at the distal forefoot laterally. Numerous foci of susceptibility compatible with soft tissue gas as seen radiographically. Electronically Signed   By: Davina Poke D.O.   On: 03/25/2022 14:24         Scheduled Meds:  atenolol  100 mg Oral Daily   atorvastatin  80 mg Oral  Daily   B-complex with vitamin C  1 tablet Oral Daily   fentaNYL  1 patch Transdermal Q72H   finasteride  5 mg Oral Daily   insulin aspart  0-15 Units Subcutaneous TID WC   insulin aspart  0-5 Units Subcutaneous QHS   insulin aspart  12 Units Subcutaneous Once   insulin glargine-yfgn  10 Units Subcutaneous Once   [START ON 03/28/2022] insulin glargine-yfgn  25 Units Subcutaneous Daily   mupirocin ointment   Nasal BID   nutrition supplement (JUVEN)  1 packet Oral BID BM   Continuous Infusions:  sodium chloride 75 mL/hr at 03/27/22 0350   penicillin G potassium 12 Million Units in dextrose 5 % 500 mL continuous infusion     potassium chloride 10 mEq (03/27/22 1229)   sodium chloride Stopped (03/25/22 2309)     LOS: 2 days   Time spent= 35 mins    Kayci [place] Arsenio Loader, MD [facility]  If 7PM-7AM, please contact night-coverage  03/27/2022, 12:32 PM

## 2022-03-27 NOTE — Consult Note (Signed)
[facility]       Reason for Consult: Wet gangrenous foot   Referring Physician: dr. Reesa Chew  Principal Problem:   Gangrene of left foot ([place]) Active Problems:   Diabetic peripheral neuropathy associated with type 2 diabetes mellitus ([place])   Thoracic spondylosis with myelopathy   Type 2 diabetes mellitus with left diabetic foot infection ([place])   Hypertension   Fatty liver disease, nonalcoholic   Pyogenic inflammation of bone (HCC)   Necrotizing soft tissue infection   Hypokalemia   Protein-calorie malnutrition, severe (HCC)   Abscess of tendon sheath, left ankle and foot    atenolol  100 mg Oral Daily   atorvastatin  80 mg Oral Daily   B-complex with vitamin C  1 tablet Oral Daily   fentaNYL  1 patch Transdermal Q72H   finasteride  5 mg Oral Daily   insulin aspart  0-5 Units Subcutaneous QHS   insulin aspart  0-9 Units Subcutaneous TID WC   insulin glargine-yfgn  15 Units Subcutaneous Daily   mupirocin ointment   Nasal BID   nutrition supplement (JUVEN)  1 packet Oral BID BM    Recommendations: Penicillin IV Repeat blood cultures (sent) TTE  Assessment: He has gangrene and now s/p TMA.  ABIs noted but with a signfiicantly elevated A1c, smoker, this will be very hard to heal long term.  I discussed with him the need for tight blood sugar control and smoking cessation to give his foot a chance to heal but also emphasized that even with great efforts, his chance of healing are low.     Antibiotics: Linezolid and pip/tazo  HPI: Robert Burnett is a 61 y.o. male with poorly controlled diabetes type 2 with a recent Hgb A1c of 14, pipe smoker, who came in on 6/6 with worsening left foot wound.  Pictures reviewed and c/w gangrene.  He underwent operative management by Dr. Sherryle Lis on 6/6 with a transmetatarsal amputation and I and D with plans for further surgery tomorrow.  Recent ABI without TBI with no evidence of arterial disease.  Tmax 100.8,  initial WBC 11.2.  started on linezolid and piperacillin/tazobactam.    Review of Systems:  Constitutional: negative for fevers and chills All other systems reviewed and are negative    Past Medical History:  Diagnosis Date   Allergy    Anemia    Arthritis    ARTHRITIS IN SPINE - MAKES PT HAVE CHEST PAIN- USES fENTANYL PATCH    Asthma    IN TEENS    Diabetes mellitus    toe amp 4/12 NOT ON MEDS IN 2 YEARS    Diabetic peripheral neuropathy associated with type 2 diabetes mellitus ([place]) 12/12/2011   Fatty liver disease, nonalcoholic 12/22/3297   GERD (gastroesophageal reflux disease)    Hyperlipidemia    Hypertension    Pneumonia    HX OF X 3    Urothelial cancer (HCC)     Social History   Tobacco Use   Smoking status: Every Day    Packs/day: 2.00    Years: 5.00    Total pack years: 10.00    Types: Pipe, Cigarettes   Smokeless tobacco: Former    Types: Nurse, children's Use: Never used  Substance Use Topics   Alcohol use: No   Drug use: No    Family History  Problem Relation Age of Onset   Hypertension Father    Diabetes Father  Cancer Father    Diabetes Sister    Cancer Sister    Cancer Mother     Allergies  Allergen Reactions   Prozac [Fluoxetine Hcl]     Headaches    Amlodipine Rash    Physical Exam: Constitutional: in no apparent distress  Vitals:   03/27/22 0548 03/27/22 0913  BP: 105/65 114/71  Pulse: 79   Resp: 16 18  Temp: 98.6 F (37 C)   SpO2: 94%    EYES: anicteric Respiratory: normal respiratory effort Musculoskeletal: left foot wrapped Skin: no rash   Lab Results  Component Value Date   WBC 6.5 03/27/2022   HGB 9.3 (L) 03/27/2022   HCT 27.9 (L) 03/27/2022   MCV 92.7 03/27/2022   PLT 142 (L) 03/27/2022    Lab Results  Component Value Date   CREATININE 1.30 (H) 03/27/2022   BUN 24 (H) 03/27/2022   NA 131 (L) 03/27/2022   K 3.5 03/27/2022   CL 99 03/27/2022   CO2 23 03/27/2022    Lab Results  Component Value  Date   ALT 22 03/25/2022   AST 19 03/25/2022   ALKPHOS 71 03/25/2022     Microbiology: Recent Results (from the past 240 hour(s))  Blood Cultures x 2 sites     Status: Abnormal   Collection Time: 03/25/22 12:30 AM   Specimen: BLOOD RIGHT WRIST  Result Value Ref Range Status   Specimen Description   Final    BLOOD RIGHT WRIST Performed at [facility], [place] [address], [place], [place] [address]    Special Requests   Final    BOTTLES DRAWN AEROBIC AND ANAEROBIC Blood Culture adequate volume Performed at [facility] [address], [place], [place] [address]    Culture  Setup Time   Final    GRAM POSITIVE COCCI IN BOTH AEROBIC AND ANAEROBIC BOTTLES CRITICAL RESULT CALLED TO, READ BACK BY AND VERIFIED WITH: PHARMD CHRISTINE SHADE ON 03/25/22 @ 1625 BY DRT Performed at [facility], [place] [address], [place], [place] [address]    Culture GROUP B STREP(S.AGALACTIAE)ISOLATED (A)  Final   Report Status 03/27/2022 FINAL  Final   Organism ID, Bacteria GROUP B STREP(S.AGALACTIAE)ISOLATED  Final      Susceptibility   Group b strep(s.agalactiae)isolated - MIC*    CLINDAMYCIN >=1 RESISTANT Resistant     AMPICILLIN <=0.25 SENSITIVE Sensitive     ERYTHROMYCIN >=8 RESISTANT Resistant     VANCOMYCIN 0.5 SENSITIVE Sensitive     CEFTRIAXONE <=0.12 SENSITIVE Sensitive     LEVOFLOXACIN 0.5 SENSITIVE Sensitive     * GROUP B STREP(S.AGALACTIAE)ISOLATED  Blood Culture ID Panel (Reflexed)     Status: Abnormal   Collection Time: 03/25/22 12:30 AM  Result Value Ref Range Status   Enterococcus faecalis NOT DETECTED NOT DETECTED Final   Enterococcus Faecium NOT DETECTED NOT DETECTED Final   Listeria monocytogenes NOT DETECTED NOT DETECTED Final   Staphylococcus species NOT DETECTED NOT DETECTED Final   Staphylococcus aureus (BCID) NOT DETECTED NOT DETECTED Final   Staphylococcus epidermidis NOT DETECTED NOT DETECTED Final   Staphylococcus lugdunensis NOT  DETECTED NOT DETECTED Final   Streptococcus species DETECTED (A) NOT DETECTED Final    Comment: CRITICAL RESULT CALLED TO, READ BACK BY AND VERIFIED WITH: PHARMD CHRISTINE SHADE ON 03/25/22 @ 1625 BY DRT    Streptococcus agalactiae DETECTED (A) NOT DETECTED Final    Comment: CRITICAL RESULT CALLED TO, READ BACK BY AND VERIFIED WITH: [place] SHADE  ON 03/25/22 @ 1625 BY DRT    Streptococcus pneumoniae NOT DETECTED NOT DETECTED Final   Streptococcus pyogenes NOT DETECTED NOT DETECTED Final   A.calcoaceticus-baumannii NOT DETECTED NOT DETECTED Final   Bacteroides fragilis NOT DETECTED NOT DETECTED Final   Enterobacterales NOT DETECTED NOT DETECTED Final   Enterobacter cloacae complex NOT DETECTED NOT DETECTED Final   Escherichia coli NOT DETECTED NOT DETECTED Final   Klebsiella aerogenes NOT DETECTED NOT DETECTED Final   Klebsiella oxytoca NOT DETECTED NOT DETECTED Final   Klebsiella pneumoniae NOT DETECTED NOT DETECTED Final   Proteus species NOT DETECTED NOT DETECTED Final   Salmonella species NOT DETECTED NOT DETECTED Final   Serratia marcescens NOT DETECTED NOT DETECTED Final   Haemophilus influenzae NOT DETECTED NOT DETECTED Final   Neisseria meningitidis NOT DETECTED NOT DETECTED Final   Pseudomonas aeruginosa NOT DETECTED NOT DETECTED Final   Stenotrophomonas maltophilia NOT DETECTED NOT DETECTED Final   Candida albicans NOT DETECTED NOT DETECTED Final   Candida auris NOT DETECTED NOT DETECTED Final   Candida glabrata NOT DETECTED NOT DETECTED Final   Candida krusei NOT DETECTED NOT DETECTED Final   Candida parapsilosis NOT DETECTED NOT DETECTED Final   Candida tropicalis NOT DETECTED NOT DETECTED Final   Cryptococcus neoformans/gattii NOT DETECTED NOT DETECTED Final    Comment: Performed at [facility], [place] [address], [place], [place] [address]  Blood Cultures x 2 sites     Status: Abnormal   Collection Time: 03/25/22 12:41 AM   Specimen: BLOOD LEFT HAND  Result  Value Ref Range Status   Specimen Description   Final    BLOOD LEFT HAND Performed at [facility] [address], [place], [place] [address]    Special Requests   Final    BOTTLES DRAWN AEROBIC ONLY Blood Culture results may not be optimal due to an inadequate volume of blood received in culture bottles Performed at [facility] [address], [place], [place] [address]    Culture (A)  Final    GROUP B STREP(S.AGALACTIAE)ISOLATED SUSCEPTIBILITIES PERFORMED ON PREVIOUS CULTURE WITHIN THE LAST 5 DAYS. Performed at [facility], [place] [address], [place], [place] [address]    Report Status 03/27/2022 FINAL  Final  MRSA Next Gen by PCR, Nasal     Status: Abnormal   Collection Time: 03/25/22  5:48 AM   Specimen: Nasal Mucosa; Nasal Swab  Result Value Ref Range Status   MRSA by PCR Next Gen DETECTED (A) NOT DETECTED Final    Comment: (NOTE) The GeneXpert MRSA Assay (FDA approved for NASAL specimens only), is one component of a comprehensive MRSA colonization surveillance program. It is not intended to diagnose MRSA infection nor to guide or monitor treatment for MRSA infections. Test performance is not FDA approved in patients less than 73 years old. Performed at [facility], [place] [address] [place], [place] [address]   Aerobic/Anaerobic Culture w Gram Stain (surgical/deep wound)     Status: Abnormal (Preliminary result)   Collection Time: 03/25/22  7:28 PM   Specimen: Wound; Abscess  Result Value Ref Range Status   Specimen Description   Final    ABSCESS FOOT LEFT Performed at [facility] [address], [place], [place] [address]    Special Requests   Final    NONE Performed at [facility], [place] [address] [place], [place] [address]    Gram Stain   Final  NO SQUAMOUS EPITHELIAL CELLS SEEN FEW WBC SEEN FEW GRAM POSITIVE RODS FEW GRAM NEGATIVE RODS FEW GRAM POSITIVE  COCCI Performed at [facility], [place] [address] [place], [place] [address]    Culture (A)  Final    MULTIPLE ORGANISMS PRESENT, NONE PREDOMINANT NO ANAEROBES ISOLATED; CULTURE IN PROGRESS FOR 5 DAYS    Report Status PENDING  Incomplete    Thayer Headings, [facility] [email or web address] 03/27/2022, 12:17 PM

## 2022-03-28 ENCOUNTER — Encounter (HOSPITAL_COMMUNITY): Payer: Self-pay | Admitting: Internal Medicine

## 2022-03-28 ENCOUNTER — Other Ambulatory Visit: Payer: Self-pay

## 2022-03-28 ENCOUNTER — Inpatient Hospital Stay (HOSPITAL_COMMUNITY): Payer: Medicaid Other | Admitting: Anesthesiology

## 2022-03-28 ENCOUNTER — Encounter (HOSPITAL_COMMUNITY)
Admission: EM | Disposition: A | Discharge status: Home Health | Payer: Self-pay | Source: Home / Self Care | Attending: Internal Medicine

## 2022-03-28 DIAGNOSIS — S91302A Unspecified open wound, left foot, initial encounter: Secondary | ICD-10-CM

## 2022-03-28 DIAGNOSIS — Z794 Long term (current) use of insulin: Secondary | ICD-10-CM

## 2022-03-28 DIAGNOSIS — F17210 Nicotine dependence, cigarettes, uncomplicated: Secondary | ICD-10-CM

## 2022-03-28 DIAGNOSIS — I10 Essential (primary) hypertension: Secondary | ICD-10-CM

## 2022-03-28 DIAGNOSIS — E10628 Type 1 diabetes mellitus with other skin complications: Secondary | ICD-10-CM

## 2022-03-28 DIAGNOSIS — Z7984 Long term (current) use of oral hypoglycemic drugs: Secondary | ICD-10-CM

## 2022-03-28 DIAGNOSIS — L089 Local infection of the skin and subcutaneous tissue, unspecified: Secondary | ICD-10-CM

## 2022-03-28 HISTORY — PX: IRRIGATION AND DEBRIDEMENT FOOT: SHX6602

## 2022-03-28 HISTORY — PX: ACHILLES TENDON SURGERY: SHX542

## 2022-03-28 LAB — CBC
HCT: 27.5 % — ABNORMAL LOW (ref 39.0–52.0)
Hemoglobin: 9 g/dL — ABNORMAL LOW (ref 13.0–17.0)
MCH: 30.9 pg (ref 26.0–34.0)
MCHC: 32.7 g/dL (ref 30.0–36.0)
MCV: 94.5 fL (ref 80.0–100.0)
Platelets: 150 10*3/uL (ref 150–400)
RBC: 2.91 MIL/uL — ABNORMAL LOW (ref 4.22–5.81)
RDW: 12.2 % (ref 11.5–15.5)
WBC: 6.3 10*3/uL (ref 4.0–10.5)
nRBC: 0 % (ref 0.0–0.2)

## 2022-03-28 LAB — BASIC METABOLIC PANEL
Anion gap: 7 (ref 5–15)
BUN: 21 mg/dL (ref 8–23)
CO2: 24 mmol/L (ref 22–32)
Calcium: 7.8 mg/dL — ABNORMAL LOW (ref 8.9–10.3)
Chloride: 100 mmol/L (ref 98–111)
Creatinine, Ser: 1.13 mg/dL (ref 0.61–1.24)
GFR, Estimated: >60 mL/min (ref >60)
Glucose, Bld: 296 mg/dL — ABNORMAL HIGH (ref 70–99)
Potassium: 3.5 mmol/L (ref 3.5–5.1)
Sodium: 131 mmol/L — ABNORMAL LOW (ref 135–145)

## 2022-03-28 LAB — SURGICAL PATHOLOGY

## 2022-03-28 LAB — GLUCOSE, CAPILLARY
Glucose-Capillary: 297 mg/dL — ABNORMAL HIGH (ref 70–99)
Glucose-Capillary: 265 mg/dL — ABNORMAL HIGH (ref 70–99)
Glucose-Capillary: 143 mg/dL — ABNORMAL HIGH (ref 70–99)
Glucose-Capillary: 130 mg/dL — ABNORMAL HIGH (ref 70–99)
Glucose-Capillary: 149 mg/dL — ABNORMAL HIGH (ref 70–99)

## 2022-03-28 LAB — MAGNESIUM: Magnesium: 2 mg/dL (ref 1.7–2.4)

## 2022-03-28 SURGERY — IRRIGATION AND DEBRIDEMENT FOOT
Anesthesia: Regional | Laterality: Left

## 2022-03-28 MED ORDER — DEXMEDETOMIDINE (PRECEDEX) IN NS 20 MCG/5ML (4 MCG/ML) IV SYRINGE
PREFILLED_SYRINGE | INTRAVENOUS | Status: DC | PRN
  Administered 2022-03-28: 8 ug via INTRAVENOUS

## 2022-03-28 MED ORDER — INSULIN ASPART 100 UNIT/ML IJ SOLN
4.0000 [IU] | Freq: Three times a day (TID) | INTRAMUSCULAR | Status: DC
  Administered 2022-03-28 – 2022-03-30 (×6): 4 [IU] via SUBCUTANEOUS

## 2022-03-28 MED ORDER — SODIUM CHLORIDE 0.9 % IR SOLN
Status: DC | PRN
  Administered 2022-03-28: 3000 mL

## 2022-03-28 MED ORDER — CLONIDINE HCL (ANALGESIA) 100 MCG/ML EP SOLN
EPIDURAL | Status: DC | PRN
  Administered 2022-03-28: 100 ug

## 2022-03-28 MED ORDER — HYDROMORPHONE HCL 1 MG/ML IJ SOLN
0.2500 mg | INTRAMUSCULAR | Status: DC | PRN
  Administered 2022-03-28: 0.5 mg via INTRAVENOUS

## 2022-03-28 MED ORDER — PROPOFOL 10 MG/ML IV BOLUS
INTRAVENOUS | Status: DC | PRN
  Administered 2022-03-28: 150 mg via INTRAVENOUS

## 2022-03-28 MED ORDER — LACTATED RINGERS IV SOLN: INTRAVENOUS | Status: DC

## 2022-03-28 MED ORDER — FENTANYL CITRATE PF 50 MCG/ML IJ SOSY: 25.0000 ug | PREFILLED_SYRINGE | INTRAMUSCULAR | Status: DC | PRN

## 2022-03-28 MED ORDER — DEXAMETHASONE SODIUM PHOSPHATE 4 MG/ML IJ SOLN
INTRAMUSCULAR | Status: DC | PRN
  Administered 2022-03-28: 10 mg via INTRAVENOUS

## 2022-03-28 MED ORDER — PROPOFOL 10 MG/ML IV BOLUS
INTRAVENOUS | Status: AC
  Filled 2022-03-28: qty 20

## 2022-03-28 MED ORDER — POTASSIUM CHLORIDE 10 MEQ/100ML IV SOLN
10.0000 meq | INTRAVENOUS | Status: AC
  Administered 2022-03-28 (×3): 10 meq via INTRAVENOUS
  Filled 2022-03-28 (×3): qty 100

## 2022-03-28 MED ORDER — MIDAZOLAM HCL 2 MG/2ML IJ SOLN
INTRAMUSCULAR | Status: AC
  Administered 2022-03-28: 1 mg
  Filled 2022-03-28: qty 2

## 2022-03-28 MED ORDER — LIDOCAINE HCL (CARDIAC) PF 100 MG/5ML IV SOSY
PREFILLED_SYRINGE | INTRAVENOUS | Status: DC | PRN
  Administered 2022-03-28: 80 mg via INTRAVENOUS

## 2022-03-28 MED ORDER — ONDANSETRON HCL 4 MG/2ML IJ SOLN
INTRAMUSCULAR | Status: DC | PRN
  Administered 2022-03-28: 4 mg via INTRAVENOUS

## 2022-03-28 MED ORDER — SODIUM CHLORIDE 0.9 % IV SOLN: INTRAVENOUS | Status: AC

## 2022-03-28 MED ORDER — FENTANYL CITRATE PF 50 MCG/ML IJ SOSY
PREFILLED_SYRINGE | INTRAMUSCULAR | Status: AC
  Administered 2022-03-28: 50 ug
  Filled 2022-03-28: qty 2

## 2022-03-28 MED ORDER — PHENYLEPHRINE HCL-NACL 20-0.9 MG/250ML-% IV SOLN
INTRAVENOUS | Status: DC | PRN
  Administered 2022-03-28: 25 ug/min via INTRAVENOUS

## 2022-03-28 MED ORDER — ACETAMINOPHEN 10 MG/ML IV SOLN: 1000.0000 mg | Freq: Once | INTRAVENOUS | Status: DC | PRN

## 2022-03-28 MED ORDER — BUPIVACAINE HCL (PF) 0.5 % IJ SOLN
INTRAMUSCULAR | Status: AC
  Filled 2022-03-28: qty 30

## 2022-03-28 MED ORDER — DEXMEDETOMIDINE (PRECEDEX) IN NS 20 MCG/5ML (4 MCG/ML) IV SYRINGE
PREFILLED_SYRINGE | INTRAVENOUS | Status: AC
  Filled 2022-03-28: qty 5

## 2022-03-28 MED ORDER — ROPIVACAINE HCL 5 MG/ML IJ SOLN
INTRAMUSCULAR | Status: DC | PRN
  Administered 2022-03-28: 40 mL via PERINEURAL

## 2022-03-28 MED ORDER — INSULIN GLARGINE-YFGN 100 UNIT/ML ~~LOC~~ SOLN
30.0000 [IU] | Freq: Every day | SUBCUTANEOUS | Status: DC
  Filled 2022-03-28 (×3): qty 0.3

## 2022-03-28 MED ORDER — VANCOMYCIN HCL 1000 MG IV SOLR
INTRAVENOUS | Status: AC
  Filled 2022-03-28: qty 20

## 2022-03-28 MED ORDER — HYDROMORPHONE HCL 1 MG/ML IJ SOLN
INTRAMUSCULAR | Status: AC
  Filled 2022-03-28: qty 1

## 2022-03-28 SURGICAL SUPPLY — 62 items
BAG COUNTER SPONGE SURGICOUNT (BAG) ×1 IMPLANT
BLADE OSCILLATING/SAGITTAL (BLADE) ×2
BLADE SURG 15 STRL LF DISP TIS (BLADE) ×1 IMPLANT
BLADE SURG 15 STRL SS (BLADE)
BLADE SW THK.38XMED LNG THN (BLADE) IMPLANT
BNDG ELASTIC 3X5.8 VLCR STR LF (GAUZE/BANDAGES/DRESSINGS) ×2 IMPLANT
BNDG ELASTIC 4X5.8 VLCR STR LF (GAUZE/BANDAGES/DRESSINGS) ×1 IMPLANT
BNDG ESMARK 4X9 LF (GAUZE/BANDAGES/DRESSINGS) IMPLANT
BNDG GAUZE ELAST 4 BULKY (GAUZE/BANDAGES/DRESSINGS) ×2 IMPLANT
CHLORAPREP W/TINT 26 (MISCELLANEOUS) IMPLANT
CNTNR URN SCR LID CUP LEK RST (MISCELLANEOUS) IMPLANT
CONT SPEC 4OZ STRL OR WHT (MISCELLANEOUS)
COVER BACK TABLE 60X90IN (DRAPES) ×1 IMPLANT
CUFF TOURN SGL QUICK 18X4 (TOURNIQUET CUFF) IMPLANT
CUFF TOURN SGL QUICK 24 (TOURNIQUET CUFF)
CUFF TRNQT CYL 24X4X16.5-23 (TOURNIQUET CUFF) IMPLANT
DRAIN CHANNEL 10M FLAT 3/4 FLT (DRAIN) ×1 IMPLANT
DRAPE 3/4 80X56 (DRAPES) ×2 IMPLANT
DRAPE EXTREMITY T 121X128X90 (DISPOSABLE) ×2 IMPLANT
DRAPE SHEET LG 3/4 BI-LAMINATE (DRAPES) ×2 IMPLANT
DRAPE U-SHAPE 47X51 STRL (DRAPES) ×1 IMPLANT
ELECT REM PT RETURN 15FT ADLT (MISCELLANEOUS) ×2 IMPLANT
EVACUATOR SILICONE 100CC (DRAIN) ×1 IMPLANT
GAUZE SPONGE 4X4 12PLY STRL (GAUZE/BANDAGES/DRESSINGS) ×2 IMPLANT
GAUZE XEROFORM 1X8 LF (GAUZE/BANDAGES/DRESSINGS) IMPLANT
GAUZE XEROFORM 5X9 LF (GAUZE/BANDAGES/DRESSINGS) ×1 IMPLANT
GLOVE BIO SURGEON STRL SZ7.5 (GLOVE) ×7 IMPLANT
GLOVE BIOGEL PI IND STRL 8 (GLOVE) ×1 IMPLANT
GLOVE BIOGEL PI INDICATOR 8 (GLOVE) ×1
GOWN STRL REUS W/ TWL XL LVL3 (GOWN DISPOSABLE) ×1 IMPLANT
GOWN STRL REUS W/TWL XL LVL3 (GOWN DISPOSABLE) ×4
KIT BASIN OR (CUSTOM PROCEDURE TRAY) ×2 IMPLANT
KIT TURNOVER KIT A (KITS) ×1 IMPLANT
MANIFOLD NEPTUNE II (INSTRUMENTS) ×2 IMPLANT
NDL HYPO 25X1 1.5 SAFETY (NEEDLE) ×1 IMPLANT
NEEDLE HYPO 25X1 1.5 SAFETY (NEEDLE) ×2 IMPLANT
NS IRRIG 1000ML POUR BTL (IV SOLUTION) ×1 IMPLANT
PACK ORTHO EXTREMITY (CUSTOM PROCEDURE TRAY) ×1 IMPLANT
PADDING CAST ABS 4INX4YD NS (CAST SUPPLIES) ×1
PADDING CAST ABS COTTON 4X4 ST (CAST SUPPLIES) ×1 IMPLANT
SCOTCHCAST PLUS 4X4 WHITE (CAST SUPPLIES) ×1 IMPLANT
SET IRRIG Y TYPE TUR BLADDER L (SET/KITS/TRAYS/PACK) ×1 IMPLANT
SPLINT FIBERGLASS 4X30 (CAST SUPPLIES) ×1 IMPLANT
SPONGE T-LAP 4X18 ~~LOC~~+RFID (SPONGE) ×1 IMPLANT
STAPLER VISISTAT 35W (STAPLE) ×1 IMPLANT
STOCKINETTE 6  STRL (DRAPES) ×2
STOCKINETTE 6 STRL (DRAPES) ×1 IMPLANT
SUCTION FRAZIER HANDLE 10FR (MISCELLANEOUS)
SUCTION TUBE FRAZIER 10FR DISP (MISCELLANEOUS) IMPLANT
SUT ETHILON 2 0 PS N (SUTURE) ×1 IMPLANT
SUT ETHILON 3 0 PS 1 (SUTURE) IMPLANT
SUT ETHILON 4 0 PS 2 18 (SUTURE) IMPLANT
SUT MNCRL AB 3-0 PS2 18 (SUTURE) ×2 IMPLANT
SUT MNCRL AB 4-0 PS2 18 (SUTURE) IMPLANT
SUT PDS AB 2-0 CT2 27 (SUTURE) ×1 IMPLANT
SUT VIC AB 2-0 SH 27 (SUTURE)
SUT VIC AB 2-0 SH 27XBRD (SUTURE) IMPLANT
SYR BULB EAR ULCER 3OZ GRN STR (SYRINGE) ×1 IMPLANT
SYR CONTROL 10ML LL (SYRINGE) ×2 IMPLANT
TUBE IRRIGATION SET MISONIX (TUBING) IMPLANT
UNDERPAD 30X36 HEAVY ABSORB (UNDERPADS AND DIAPERS) ×2 IMPLANT
YANKAUER SUCT BULB TIP NO VENT (SUCTIONS) ×2 IMPLANT

## 2022-03-28 NOTE — Anesthesia Preprocedure Evaluation (Signed)
Anesthesia Evaluation  Patient identified by MRN, date of birth, ID band Patient awake    Reviewed: Allergy & Precautions, NPO status , Patient's Chart, lab work & pertinent test results  Airway Mallampati: II  TM Distance: >3 FB Neck ROM: Full    Dental no notable dental hx.    Pulmonary asthma , Current Smoker,    Pulmonary exam normal        Cardiovascular hypertension,  Rhythm:Regular Rate:Normal     Neuro/Psych negative neurological ROS  negative psych ROS   GI/Hepatic GERD  ,NASH   Endo/Other  diabetes, Poorly Controlled, Type 2, Insulin Dependent, Oral Hypoglycemic Agents  Renal/GU negative Renal ROS  negative genitourinary   Musculoskeletal  (+) Arthritis , Osteoarthritis,  Left foot infection   Abdominal Normal abdominal exam  (+)   Peds  Hematology  (+) Blood dyscrasia, anemia , Lab Results      Component                Value               Date                      WBC                      6.3                 03/28/2022                HGB                      9.0 (L)             03/28/2022                HCT                      27.5 (L)            03/28/2022                MCV                      94.5                03/28/2022                PLT                      150                 03/28/2022           Lab Results      Component                Value               Date                      NA                       131 (L)             03/28/2022                K  3.5                 03/28/2022                CO2                      24                  03/28/2022                GLUCOSE                  296 (H)             03/28/2022                BUN                      21                  03/28/2022                CREATININE               1.13                03/28/2022                CALCIUM                  7.8 (L)             03/28/2022                GFRNONAA                  >60                 03/28/2022             Anesthesia Other Findings   Reproductive/Obstetrics                             Anesthesia Physical  Anesthesia Plan  ASA: 3  Anesthesia Plan: General and Regional   Post-op Pain Management: Regional block*   Induction: Intravenous  PONV Risk Score and Plan: Ondansetron, Dexamethasone, Midazolam and Treatment may vary due to age or medical condition  Airway Management Planned: Mask and LMA  Additional Equipment: None  Intra-op Plan:   Post-operative Plan: Extubation in OR  Informed Consent: I have reviewed the patients History and Physical, chart, labs and discussed the procedure including the risks, benefits and alternatives for the proposed anesthesia with the patient or authorized representative who has indicated his/her understanding and acceptance.     Dental advisory given  Plan Discussed with: CRNA  Anesthesia Plan Comments:         Anesthesia Quick Evaluation

## 2022-03-28 NOTE — Anesthesia Procedure Notes (Signed)
Anesthesia Regional Block: Adductor canal block   Pre-Anesthetic Checklist: , timeout performed,  Correct Patient, Correct Site, Correct Laterality,  Correct Procedure, Correct Position, site marked,  Risks and benefits discussed,  Surgical consent,  Pre-op evaluation,  At surgeon's request and post-op pain management  Laterality: Left  Prep: Dura Prep       Needles:  Injection technique: Single-shot  Needle Type: Echogenic Stimulator Needle     Needle Length: 9cm  Needle Gauge: 20     Additional Needles:   Procedures:,,,, ultrasound used (permanent image in chart),,    Narrative:  Start time: 03/28/2022 6:58 PM End time: 03/28/2022 7:01 PM Injection made incrementally with aspirations every 5 mL.  Performed by: Personally  Anesthesiologist: Darral Dash, DO  Additional Notes: Patient identified. Risks/Benefits/Options discussed with patient including but not limited to bleeding, infection, nerve damage, failed block, incomplete pain control. Patient expressed understanding and wished to proceed. All questions were answered. Sterile technique was used throughout the entire procedure. Please see nursing notes for vital signs. Aspirated in 5cc intervals with injection for negative confirmation. Patient was given instructions on fall risk and not to get out of bed. All questions and concerns addressed with instructions to call with any issues or inadequate analgesia.

## 2022-03-28 NOTE — Transfer of Care (Signed)
Immediate Anesthesia Transfer of Care Note  Patient: Robert Burnett  Procedure(s) Performed: AMPUTATION LISFRANC, SECONDARY WOUND CLOSURE (Left) ACHILLES LENGTHENING/KIDNER (Left)  Patient Location: PACU  Anesthesia Type:General  Level of Consciousness: awake, alert  and oriented  Airway & Oxygen Therapy: Patient Spontanous Breathing and Patient connected to nasal cannula oxygen  Post-op Assessment: Report given to RN and Post -op Vital signs reviewed and stable  Post vital signs: Reviewed and stable  Last Vitals:  Vitals Value Taken Time  BP    Temp    Pulse    Resp    SpO2      Last Pain:  Vitals:   03/28/22 2051  TempSrc:   PainSc: 9       Patients Stated Pain Goal: 3 (26/20/35 5974)  Complications: No notable events documented.

## 2022-03-28 NOTE — Progress Notes (Signed)
AssistedDr. Jana Half with left, adductor canal, ultrasound guided block. Side rails up, monitors on throughout procedure. See vital signs in flow sheet. Tolerated Procedure well.  AssistedDr. Jana Half with left, popliteal, ultrasound guided block. Side rails up, monitors on throughout procedure. See vital signs in flow sheet. Tolerated Procedure well.

## 2022-03-28 NOTE — Anesthesia Procedure Notes (Signed)
Procedure Name: LMA Insertion Date/Time: 03/28/2022 7:09 PM  Performed by: [place] Manus Rudd, CRNAPre-anesthesia Checklist: Patient identified, Emergency Drugs available, Suction available and Patient being monitored Patient Re-evaluated:Patient Re-evaluated prior to induction Oxygen Delivery Method: Circle system utilized Preoxygenation: Pre-oxygenation with 100% oxygen Induction Type: IV induction Ventilation: Mask ventilation without difficulty LMA: LMA inserted LMA Size: 4.0 Number of attempts: 1 Airway Equipment and Method: Bite block Placement Confirmation: positive ETCO2 Tube secured with: Tape Dental Injury: Teeth and Oropharynx as per pre-operative assessment

## 2022-03-28 NOTE — Anesthesia Procedure Notes (Signed)
Anesthesia Regional Block: Popliteal block   Pre-Anesthetic Checklist: , timeout performed,  Correct Patient, Correct Site, Correct Laterality,  Correct Procedure, Correct Position, site marked,  Risks and benefits discussed,  Surgical consent,  Pre-op evaluation,  At surgeon's request and post-op pain management  Laterality: Left  Prep: Dura Prep       Needles:  Injection technique: Single-shot  Needle Type: Echogenic Stimulator Needle     Needle Length: 9cm  Needle Gauge: 20     Additional Needles:   Procedures:,,,, ultrasound used (permanent image in chart),,    Narrative:  Start time: 03/28/2022 7:03 PM End time: 03/28/2022 7:05 PM Injection made incrementally with aspirations every 5 mL.  Performed by: Personally  Anesthesiologist: Darral Dash, DO  Additional Notes: Patient identified. Risks/Benefits/Options discussed with patient including but not limited to bleeding, infection, nerve damage, failed block, incomplete pain control. Patient expressed understanding and wished to proceed. All questions were answered. Sterile technique was used throughout the entire procedure. Please see nursing notes for vital signs. Aspirated in 5cc intervals with injection for negative confirmation. Patient was given instructions on fall risk and not to get out of bed. All questions and concerns addressed with instructions to call with any issues or inadequate analgesia.

## 2022-03-28 NOTE — H&P (Signed)
History and Physical Interval Note:  03/28/2022 6:59 PM  Robert Burnett  has presented today for surgery, with the diagnosis of open wound, infection.  The various methods of treatment have been discussed with the patient and family. After consideration of risks, benefits and other options for treatment, the patient has consented to   Procedure(s) with comments: IRRIGATION AND DEBRIDEMENT FOOT (Left) ACHILLES LENGTHENING/KIDNER (Left) APPLICATION OF WOUND VAC (Left) GRAFT APPLICATION (Left) - Integra bilayer as a surgical intervention.  The patient's history has been reviewed, patient examined, no change in status, stable for surgery.  I have reviewed the patient's chart and labs.  Questions were answered to the patient's satisfaction.     [name]

## 2022-03-28 NOTE — Progress Notes (Signed)
PROGRESS NOTE    Robert Burnett  TDV:[id number] DOB: [birth year] DOA: 03/24/2022 PCP: [name], MD   Brief Narrative:  61 y.o. male with medical history significant of seasonal allergies, unspecified anemia, DDD, asthma, cigarette smoker, type II DM, diabetic peripheral neuropathy, nonalcoholic fatty liver disease, GERD, hyperlipidemia, hypertension, history of pneumonia x3, urothelial cancer who is coming to the emergency department due to worsening of left foot wound for the past 2 weeks, but particularly a lot worse in the last 2 days that it has changing color and been draining a foul-smelling discharge after he kept the wrap for a week.  Upon admission patient was noted to be hyperglycemic with evidence of gangrene left foot, necrotizing infection.  Podiatry was consulted and he underwent transmetatarsal left amputation with I&D on 6/6.  Eventually his blood cultures grew group B strep bacteremia, ID was consulted and patient was started on IV penicillin.  Assessment & Plan:  Principal Problem:   Gangrene of left foot (HCC) Active Problems:   Diabetic peripheral neuropathy associated with type 2 diabetes mellitus (HCC)   Thoracic spondylosis with myelopathy   Type 2 diabetes mellitus with left diabetic foot infection (HCC)   Hypertension   Fatty liver disease, nonalcoholic   Pyogenic inflammation of bone (HCC)   Necrotizing soft tissue infection   Hypokalemia   Protein-calorie malnutrition, severe (HCC)   Abscess of tendon sheath, left ankle and foot     Severe left foot diabetic infection with wet gangrene and necrotizing infection with osteomyelitis Abscess noted in the area as well - Status post transmetatarsal amputation of the left foot with I&D by podiatry on 6/6.  Currently n.p.o. for the OR by podiatry later today -Pain control, ID consulted.  While n.p.o. we will give him IV fluids - On IV penicillin. -[place] ABI - normal. No further vascular eval for now, can follow up  outptn .  Group B strep bacteremia - Continue current IV antibiotics.  Repeat cultures-pending.  Will defer need for TEE to ID - Echocardiogram-normal without evidence of vegetations - IV penicillin  Diabetes mellitus type 2 with severe hyperglycemia - On home patient is on metformin 500 mg 3 times daily.  Hemoglobin A1c is 14.2.  Increase Semglee to 30 units daily, sliding scale and Accu-Cheks.  NovoLog 4 units Premeal.    Thoracic spondylosis with myelopathy - Pain control  Essential hypertension - Atenolol  Nonalcoholic fatty liver disease - Monitor LFTs  Hypokalemia - Replete as necessary  Severe protein calorie malnutrition - Nutrition evaluation  DVT prophylaxis: SCDs Start: 03/25/22 0411  Code Status: Full  Family Communication: Spoke with patient's family member over the phone while I was in the room with him over the speaker from his phone.  Status is: Inpatient Remains inpatient appropriate because: Maintain hospital stay on antibiotics, discharge once cleared by podiatry and infectious disease.  In the meantime ongoing management for his blood glucose.  Subjective: Reporting of left lower extremity pain at this site of his infection.  No other complaints at this time.  Examination: Constitutional: Not in acute distress Respiratory: Clear to auscultation bilaterally Cardiovascular: Normal sinus rhythm, no rubs Abdomen: Nontender nondistended good bowel sounds Musculoskeletal: No edema noted Skin: No rashes seen Neurologic: CN 2-12 grossly intact.  And nonfocal Psychiatric: Normal judgment and insight. Alert and oriented x 3. Normal mood. Surgical site of the foot noted, dressing in place.   Objective: Vitals:   03/27/22 1403 03/27/22 2051 03/28/22 0500 03/28/22 0516  BP:  109/67 100/74  96/64  Pulse: 79 69  65  Resp: '20 18  18  '$ Temp: 99.5 F (37.5 C) 98.1 F (36.7 C)  98.4 F (36.9 C)  TempSrc: Oral Oral  Oral  SpO2: 96% 97%  99%  Weight:   80.9 kg    Height:        Intake/Output Summary (Last 24 hours) at 03/28/2022 0815 Last data filed at 03/28/2022 0600 Gross per 24 hour  Intake 2707.27 ml  Output 1400 ml  Net 1307.27 ml   Filed Weights   03/25/22 0500 03/26/22 0500 03/28/22 0500  Weight: 77 kg 81.4 kg 80.9 kg     Data Reviewed:   CBC: Recent Labs  Lab 03/24/22 0030 03/25/22 0151 03/25/22 1011 03/27/22 0421 03/28/22 0417  WBC 11.2*  --  11.9* 6.5 6.3  NEUTROABS 9.6*  --  10.0*  --   --   HGB 12.1* 12.6* 11.9* 9.3* 9.0*  HCT 34.8* 37.0* 35.4* 27.9* 27.5*  MCV 89.5  --  93.2 92.7 94.5  PLT 231  --  259 142* 938   Basic Metabolic Panel: Recent Labs  Lab 03/25/22 1151 03/25/22 1643 03/26/22 0816 03/27/22 0421 03/28/22 0417  NA 131* 133* 135 131* 131*  K 3.1* 3.1* 2.8* 3.5 3.5  CL 96* 99 104 99 100  CO2 22 24 20* 23 24  GLUCOSE 252* 385* 147* 385* 296*  BUN 44* 37* 21 24* 21  CREATININE 1.21 1.05 1.07 1.30* 1.13  CALCIUM 8.9 8.9 8.4* 8.0* 7.8*  MG 1.8  --   --  1.9 2.0   GFR: Estimated Creatinine Clearance: 75.3 mL/min (by C-G formula based on SCr of 1.13 mg/dL). Liver Function Tests: Recent Labs  Lab 03/25/22 0030 03/25/22 1151  AST 27 19  ALT 29 22  ALKPHOS 100 71  BILITOT 0.9 0.8  PROT 8.2* 6.8  ALBUMIN 2.7* 2.3*   No results for input(s): "LIPASE", "AMYLASE" in the last 168 hours. No results for input(s): "AMMONIA" in the last 168 hours. Coagulation Profile: No results for input(s): "INR", "PROTIME" in the last 168 hours. Cardiac Enzymes: No results for input(s): "CKTOTAL", "CKMB", "CKMBINDEX", "TROPONINI" in the last 168 hours. BNP (last 3 results) No results for input(s): "PROBNP" in the last 8760 hours. HbA1C: Recent Labs    03/25/22 1011  HGBA1C 14.2*   CBG: Recent Labs  Lab 03/27/22 0737 03/27/22 1158 03/27/22 1632 03/27/22 2143 03/28/22 0738  GLUCAP 379* 405* 372* 297* 297*   Lipid Profile: No results for input(s): "CHOL", "HDL", "LDLCALC", "TRIG", "CHOLHDL",  "LDLDIRECT" in the last 72 hours. Thyroid Function Tests: No results for input(s): "TSH", "T4TOTAL", "FREET4", "T3FREE", "THYROIDAB" in the last 72 hours. Anemia Panel: No results for input(s): "VITAMINB12", "FOLATE", "FERRITIN", "TIBC", "IRON", "RETICCTPCT" in the last 72 hours. Sepsis Labs: Recent Labs  Lab 03/25/22 0051  LATICACIDVEN 1.6    Recent Results (from the past 240 hour(s))  Blood Cultures x 2 sites     Status: Abnormal   Collection Time: 03/25/22 12:30 AM   Specimen: BLOOD RIGHT WRIST  Result Value Ref Range Status   Specimen Description   Final    BLOOD RIGHT WRIST Performed at [facility] [address] [place], [place] [address]    Special Requests   Final    BOTTLES DRAWN AEROBIC AND ANAEROBIC Blood Culture adequate volume Performed at [facility] [address], [place], [place] [address]    Culture  Setup Time   Final  GRAM POSITIVE COCCI IN BOTH AEROBIC AND ANAEROBIC BOTTLES CRITICAL RESULT CALLED TO, READ BACK BY AND VERIFIED WITH: PHARMD CHRISTINE SHADE ON 03/25/22 @ 1625 BY DRT Performed at [facility], [place] [address], [place], [place] [address]    Culture GROUP B STREP(S.AGALACTIAE)ISOLATED (A)  Final   Report Status 03/27/2022 FINAL  Final   Organism ID, Bacteria GROUP B STREP(S.AGALACTIAE)ISOLATED  Final      Susceptibility   Group b strep(s.agalactiae)isolated - MIC*    CLINDAMYCIN >=1 RESISTANT Resistant     AMPICILLIN <=0.25 SENSITIVE Sensitive     ERYTHROMYCIN >=8 RESISTANT Resistant     VANCOMYCIN 0.5 SENSITIVE Sensitive     CEFTRIAXONE <=0.12 SENSITIVE Sensitive     LEVOFLOXACIN 0.5 SENSITIVE Sensitive     * GROUP B STREP(S.AGALACTIAE)ISOLATED  Blood Culture ID Panel (Reflexed)     Status: Abnormal   Collection Time: 03/25/22 12:30 AM  Result Value Ref Range Status   Enterococcus faecalis NOT DETECTED NOT DETECTED Final   Enterococcus Faecium NOT DETECTED NOT DETECTED Final   Listeria  monocytogenes NOT DETECTED NOT DETECTED Final   Staphylococcus species NOT DETECTED NOT DETECTED Final   Staphylococcus aureus (BCID) NOT DETECTED NOT DETECTED Final   Staphylococcus epidermidis NOT DETECTED NOT DETECTED Final   Staphylococcus lugdunensis NOT DETECTED NOT DETECTED Final   Streptococcus species DETECTED (A) NOT DETECTED Final    Comment: CRITICAL RESULT CALLED TO, READ BACK BY AND VERIFIED WITH: PHARMD CHRISTINE SHADE ON 03/25/22 @ 1625 BY DRT    Streptococcus agalactiae DETECTED (A) NOT DETECTED Final    Comment: CRITICAL RESULT CALLED TO, READ BACK BY AND VERIFIED WITH: PHARMD CHRISTINE SHADE ON 03/25/22 @ 1625 BY DRT    Streptococcus pneumoniae NOT DETECTED NOT DETECTED Final   Streptococcus pyogenes NOT DETECTED NOT DETECTED Final   A.calcoaceticus-baumannii NOT DETECTED NOT DETECTED Final   Bacteroides fragilis NOT DETECTED NOT DETECTED Final   Enterobacterales NOT DETECTED NOT DETECTED Final   Enterobacter cloacae complex NOT DETECTED NOT DETECTED Final   Escherichia coli NOT DETECTED NOT DETECTED Final   Klebsiella aerogenes NOT DETECTED NOT DETECTED Final   Klebsiella oxytoca NOT DETECTED NOT DETECTED Final   Klebsiella pneumoniae NOT DETECTED NOT DETECTED Final   Proteus species NOT DETECTED NOT DETECTED Final   Salmonella species NOT DETECTED NOT DETECTED Final   Serratia marcescens NOT DETECTED NOT DETECTED Final   Haemophilus influenzae NOT DETECTED NOT DETECTED Final   Neisseria meningitidis NOT DETECTED NOT DETECTED Final   Pseudomonas aeruginosa NOT DETECTED NOT DETECTED Final   Stenotrophomonas maltophilia NOT DETECTED NOT DETECTED Final   Candida albicans NOT DETECTED NOT DETECTED Final   Candida auris NOT DETECTED NOT DETECTED Final   Candida glabrata NOT DETECTED NOT DETECTED Final   Candida krusei NOT DETECTED NOT DETECTED Final   Candida parapsilosis NOT DETECTED NOT DETECTED Final   Candida tropicalis NOT DETECTED NOT DETECTED Final   Cryptococcus  neoformans/gattii NOT DETECTED NOT DETECTED Final    Comment: Performed at [facility], [place] [address] [place], [place] [address]  Blood Cultures x 2 sites     Status: Abnormal   Collection Time: 03/25/22 12:41 AM   Specimen: BLOOD LEFT HAND  Result Value Ref Range Status   Specimen Description   Final    BLOOD LEFT HAND Performed at [facility] [address] [place], [place] [address]    Special Requests   Final    BOTTLES DRAWN AEROBIC ONLY Blood Culture  results may not be optimal due to an inadequate volume of blood received in culture bottles Performed at [facility], [place] [address] [place], [place] [address]    Culture (A)  Final    GROUP B STREP(S.AGALACTIAE)ISOLATED SUSCEPTIBILITIES PERFORMED ON PREVIOUS CULTURE WITHIN THE LAST 5 DAYS. Performed at [facility], [place] [address] [place], [place] [address]    Report Status 03/27/2022 FINAL  Final  MRSA Next Gen by PCR, Nasal     Status: Abnormal   Collection Time: 03/25/22  5:48 AM   Specimen: Nasal Mucosa; Nasal Swab  Result Value Ref Range Status   MRSA by PCR Next Gen DETECTED (A) NOT DETECTED Final    Comment: (NOTE) The GeneXpert MRSA Assay (FDA approved for NASAL specimens only), is one component of a comprehensive MRSA colonization surveillance program. It is not intended to diagnose MRSA infection nor to guide or monitor treatment for MRSA infections. Test performance is not FDA approved in patients less than 29 years old. Performed at [facility], [place] [address] [place], [place] [address]   Aerobic/Anaerobic Culture w Gram Stain (surgical/deep wound)     Status: Abnormal (Preliminary result)   Collection Time: 03/25/22  7:28 PM   Specimen: Wound; Abscess  Result Value Ref Range Status   Specimen Description   Final    ABSCESS FOOT LEFT Performed at [facility] [address], [place], [place] [address]    Special  Requests   Final    NONE Performed at [facility], [place] [address], [place], [place] [address]    Gram Stain   Final    NO SQUAMOUS EPITHELIAL CELLS SEEN FEW WBC SEEN FEW GRAM POSITIVE RODS FEW GRAM NEGATIVE RODS FEW GRAM POSITIVE COCCI Performed at [facility], [place] [address] [place],  [address]    Culture (A)  Final    MULTIPLE ORGANISMS PRESENT, NONE PREDOMINANT NO ANAEROBES ISOLATED; CULTURE IN PROGRESS FOR 5 DAYS    Report Status PENDING  Incomplete         Radiology Studies: ECHOCARDIOGRAM COMPLETE  Result Date: 03/27/2022    ECHOCARDIOGRAM REPORT   Patient Name:   Robert Burnett Date of Exam: 03/27/2022 Medical Rec #:  [id number]        Height:       72.0 in Accession #:    [id number]       Weight:       179.5 lb Date of Birth:  02-02-1961         BSA:          2.035 m Patient Age:    64 years         BP:           114/71 mmHg Patient Gender: M                HR:           74 bpm. Exam Location:  Inpatient Procedure: 2D Echo, Cardiac Doppler and Color Doppler Indications:    Bacteremia  History:        Patient has no prior history of Echocardiogram examinations.                 Risk Factors:Hypertension and Diabetes.  Sonographer:    Jefferey Pica Referring Phys: [id number] Runell Kovich CHIRAG Makaylie Dedeaux IMPRESSIONS  1. Left ventricular ejection fraction, by estimation, is 55 to 60%. The left ventricle has normal function. The left  ventricle has no regional wall motion abnormalities. Left ventricular diastolic parameters were normal.  2. Right ventricular systolic function is normal. The right ventricular size is normal. There is mildly elevated pulmonary artery systolic pressure.  3. The mitral valve is normal in structure. Mild mitral valve regurgitation. No evidence of mitral stenosis.  4. The aortic valve is tricuspid. Aortic valve regurgitation is not visualized. No aortic stenosis is present.  5. The inferior vena cava is dilated in size with <50% respiratory  variability, suggesting right atrial pressure of 15 mmHg. FINDINGS  Left Ventricle: Left ventricular ejection fraction, by estimation, is 55 to 60%. The left ventricle has normal function. The left ventricle has no regional wall motion abnormalities. The left ventricular internal cavity size was normal in size. There is  no left ventricular hypertrophy. Left ventricular diastolic parameters were normal. Indeterminate filling pressures. Right Ventricle: The right ventricular size is normal. No increase in right ventricular wall thickness. Right ventricular systolic function is normal. There is mildly elevated pulmonary artery systolic pressure. The tricuspid regurgitant velocity is 2.34  m/s, and with an assumed right atrial pressure of 15 mmHg, the estimated right ventricular systolic pressure is 00.7 mmHg. Left Atrium: Left atrial size was normal in size. Right Atrium: Right atrial size was normal in size. Pericardium: There is no evidence of pericardial effusion. Mitral Valve: The mitral valve is normal in structure. Mild mitral valve regurgitation. No evidence of mitral valve stenosis. Tricuspid Valve: The tricuspid valve is normal in structure. Tricuspid valve regurgitation is mild . No evidence of tricuspid stenosis. Aortic Valve: The aortic valve is tricuspid. Aortic valve regurgitation is not visualized. No aortic stenosis is present. Aortic valve peak gradient measures 7.1 mmHg. Pulmonic Valve: The pulmonic valve was normal in structure. Pulmonic valve regurgitation is not visualized. No evidence of pulmonic stenosis. Aorta: The aortic root is normal in size and structure. Venous: The inferior vena cava is dilated in size with less than 50% respiratory variability, suggesting right atrial pressure of 15 mmHg. IAS/Shunts: No atrial level shunt detected by color flow Doppler.  LEFT VENTRICLE PLAX 2D LVIDd:         5.20 cm   Diastology LVIDs:         3.80 cm   LV e' medial:    7.80 cm/s LV PW:         1.20 cm    LV E/e' medial:  13.1 LV IVS:        1.00 cm   LV e' lateral:   11.10 cm/s LVOT diam:     2.10 cm   LV E/e' lateral: 9.2 LV SV:         78 LV SV Index:   38 LVOT Area:     3.46 cm  RIGHT VENTRICLE             IVC RV Basal diam:  3.20 cm     IVC diam: 2.60 cm RV Mid diam:    3.50 cm RV S prime:     12.60 cm/s TAPSE (M-mode): 1.9 cm LEFT ATRIUM             Index        RIGHT ATRIUM           Index LA diam:        4.30 cm 2.11 cm/m   RA Area:     17.90 cm LA Vol (A2C):   41.0 ml 20.15 ml/m  RA Volume:   45.70 ml  22.46 ml/m LA Vol (A4C):   54.2 ml 26.64 ml/m LA Biplane Vol: 49.5 ml 24.33 ml/m  AORTIC VALVE                 PULMONIC VALVE AV Area (Vmax): 3.14 cm     PV Vmax:       0.69 m/s AV Vmax:        133.50 cm/s  PV Peak grad:  1.9 mmHg AV Peak Grad:   7.1 mmHg LVOT Vmax:      121.00 cm/s LVOT Vmean:     76.100 cm/s LVOT VTI:       0.225 m  AORTA Ao Root diam: 3.40 cm Ao Asc diam:  3.50 cm MITRAL VALVE                TRICUSPID VALVE MV Area (PHT): 4.57 cm     TR Peak grad:   21.9 mmHg MV Decel Time: 166 msec     TR Vmax:        234.00 cm/s MR Peak grad: 71.6 mmHg MR Vmax:      423.00 cm/s   SHUNTS MV E velocity: 102.00 cm/s  Systemic VTI:  0.22 m MV A velocity: 61.70 cm/s   Systemic Diam: 2.10 cm MV E/A ratio:  1.65 Skeet Latch MD Electronically signed by Skeet Latch MD Signature Date/Time: 03/27/2022/5:31:26 PM    Final         Scheduled Meds:  atenolol  100 mg Oral Daily   atorvastatin  80 mg Oral Daily   B-complex with vitamin C  1 tablet Oral Daily   fentaNYL  1 patch Transdermal Q72H   finasteride  5 mg Oral Daily   insulin aspart  0-15 Units Subcutaneous TID WC   insulin aspart  0-5 Units Subcutaneous QHS   insulin aspart  4 Units Subcutaneous TID WC   insulin glargine-yfgn  30 Units Subcutaneous Daily   mupirocin ointment   Nasal BID   nutrition supplement (JUVEN)  1 packet Oral BID BM   Continuous Infusions:  sodium chloride     penicillin G potassium 12 Million Units in  dextrose 5 % 500 mL continuous infusion 12 Million Units (03/28/22 0541)   sodium chloride Stopped (03/25/22 2309)     LOS: 3 days   Time spent= 35 mins    Garnette Greb Arsenio Loader, MD [facility]  If 7PM-7AM, please contact night-coverage  03/28/2022, 8:15 AM

## 2022-03-28 NOTE — Progress Notes (Signed)
[facility]   Reason for visit: Follow up on gangrene of left foot  Interval History: no issues with rash or diarrhea, WBC 6.3, remains afebrile.  Day 5 total antibiotics Day 2 penicillin  Physical Exam: Constitutional:  Vitals:   03/28/22 0516 03/28/22 0857  BP: 96/64 124/77  Pulse: 65 68  Resp: 18   Temp: 98.4 F (36.9 C)   SpO2: 99% 100%   patient appears in NAD Respiratory: Normal respiratory effort Skin: no rashes  Review of Systems: Constitutional: negative for fevers and chills  Lab Results  Component Value Date   WBC 6.3 03/28/2022   HGB 9.0 (L) 03/28/2022   HCT 27.5 (L) 03/28/2022   MCV 94.5 03/28/2022   PLT 150 03/28/2022    Lab Results  Component Value Date   CREATININE 1.13 03/28/2022   BUN 21 03/28/2022   NA 131 (L) 03/28/2022   K 3.5 03/28/2022   CL 100 03/28/2022   CO2 24 03/28/2022    Lab Results  Component Value Date   ALT 22 03/25/2022   AST 19 03/25/2022   ALKPHOS 71 03/25/2022     Microbiology: Recent Results (from the past 240 hour(s))  Blood Cultures x 2 sites     Status: Abnormal   Collection Time: 03/25/22 12:30 AM   Specimen: BLOOD RIGHT WRIST  Result Value Ref Range Status   Specimen Description   Final    BLOOD RIGHT WRIST Performed at [facility], [place] [address] [place], [place] [address]    Special Requests   Final    BOTTLES DRAWN AEROBIC AND ANAEROBIC Blood Culture adequate volume Performed at [facility] [address], [place], [place] [address]    Culture  Setup Time   Final    GRAM POSITIVE COCCI IN BOTH AEROBIC AND ANAEROBIC BOTTLES CRITICAL RESULT CALLED TO, READ BACK BY AND VERIFIED WITH: PHARMD CHRISTINE SHADE ON 03/25/22 @ 1625 BY DRT Performed at [facility], [place] [address], [place], [place] [address]    Culture GROUP B STREP(S.AGALACTIAE)ISOLATED (A)  Final   Report Status 03/27/2022 FINAL  Final   Organism ID, Bacteria GROUP B  STREP(S.AGALACTIAE)ISOLATED  Final      Susceptibility   Group b strep(s.agalactiae)isolated - MIC*    CLINDAMYCIN >=1 RESISTANT Resistant     AMPICILLIN <=0.25 SENSITIVE Sensitive     ERYTHROMYCIN >=8 RESISTANT Resistant     VANCOMYCIN 0.5 SENSITIVE Sensitive     CEFTRIAXONE <=0.12 SENSITIVE Sensitive     LEVOFLOXACIN 0.5 SENSITIVE Sensitive     * GROUP B STREP(S.AGALACTIAE)ISOLATED  Blood Culture ID Panel (Reflexed)     Status: Abnormal   Collection Time: 03/25/22 12:30 AM  Result Value Ref Range Status   Enterococcus faecalis NOT DETECTED NOT DETECTED Final   Enterococcus Faecium NOT DETECTED NOT DETECTED Final   Listeria monocytogenes NOT DETECTED NOT DETECTED Final   Staphylococcus species NOT DETECTED NOT DETECTED Final   Staphylococcus aureus (BCID) NOT DETECTED NOT DETECTED Final   Staphylococcus epidermidis NOT DETECTED NOT DETECTED Final   Staphylococcus lugdunensis NOT DETECTED NOT DETECTED Final   Streptococcus species DETECTED (A) NOT DETECTED Final    Comment: CRITICAL RESULT CALLED TO, READ BACK BY AND VERIFIED WITH: PHARMD CHRISTINE SHADE ON 03/25/22 @ 1625 BY DRT    Streptococcus agalactiae DETECTED (A) NOT DETECTED Final    Comment: CRITICAL RESULT CALLED TO, READ BACK BY AND VERIFIED WITH: PHARMD CHRISTINE SHADE ON 03/25/22 @  1625 BY DRT    Streptococcus pneumoniae NOT DETECTED NOT DETECTED Final   Streptococcus pyogenes NOT DETECTED NOT DETECTED Final   A.calcoaceticus-baumannii NOT DETECTED NOT DETECTED Final   Bacteroides fragilis NOT DETECTED NOT DETECTED Final   Enterobacterales NOT DETECTED NOT DETECTED Final   Enterobacter cloacae complex NOT DETECTED NOT DETECTED Final   Escherichia coli NOT DETECTED NOT DETECTED Final   Klebsiella aerogenes NOT DETECTED NOT DETECTED Final   Klebsiella oxytoca NOT DETECTED NOT DETECTED Final   Klebsiella pneumoniae NOT DETECTED NOT DETECTED Final   Proteus species NOT DETECTED NOT DETECTED Final   Salmonella species NOT  DETECTED NOT DETECTED Final   Serratia marcescens NOT DETECTED NOT DETECTED Final   Haemophilus influenzae NOT DETECTED NOT DETECTED Final   Neisseria meningitidis NOT DETECTED NOT DETECTED Final   Pseudomonas aeruginosa NOT DETECTED NOT DETECTED Final   Stenotrophomonas maltophilia NOT DETECTED NOT DETECTED Final   Candida albicans NOT DETECTED NOT DETECTED Final   Candida auris NOT DETECTED NOT DETECTED Final   Candida glabrata NOT DETECTED NOT DETECTED Final   Candida krusei NOT DETECTED NOT DETECTED Final   Candida parapsilosis NOT DETECTED NOT DETECTED Final   Candida tropicalis NOT DETECTED NOT DETECTED Final   Cryptococcus neoformans/gattii NOT DETECTED NOT DETECTED Final    Comment: Performed at [facility], [place] [address], [place], [place] [address]  Blood Cultures x 2 sites     Status: Abnormal   Collection Time: 03/25/22 12:41 AM   Specimen: BLOOD LEFT HAND  Result Value Ref Range Status   Specimen Description   Final    BLOOD LEFT HAND Performed at [facility] [address] [place], [place] [address]    Special Requests   Final    BOTTLES DRAWN AEROBIC ONLY Blood Culture results may not be optimal due to an inadequate volume of blood received in culture bottles Performed at [facility] [address], [place], [place] [address]    Culture (A)  Final    GROUP B STREP(S.AGALACTIAE)ISOLATED SUSCEPTIBILITIES PERFORMED ON PREVIOUS CULTURE WITHIN THE LAST 5 DAYS. Performed at [facility], [place] [address], [place], [place] [address]    Report Status 03/27/2022 FINAL  Final  MRSA Next Gen by PCR, Nasal     Status: Abnormal   Collection Time: 03/25/22  5:48 AM   Specimen: Nasal Mucosa; Nasal Swab  Result Value Ref Range Status   MRSA by PCR Next Gen DETECTED (A) NOT DETECTED Final    Comment: (NOTE) The GeneXpert MRSA Assay (FDA approved for NASAL specimens only), is one component of a comprehensive MRSA colonization  surveillance program. It is not intended to diagnose MRSA infection nor to guide or monitor treatment for MRSA infections. Test performance is not FDA approved in patients less than 30 years old. Performed at [facility], [place] [address], [place], [place] [address]   Aerobic/Anaerobic Culture w Gram Stain (surgical/deep wound)     Status: Abnormal (Preliminary result)   Collection Time: 03/25/22  7:28 PM   Specimen: Wound; Abscess  Result Value Ref Range Status   Specimen Description   Final    ABSCESS FOOT LEFT Performed at [facility] [address] [place], [place] [address]    Special Requests   Final    NONE Performed at [facility], [place] [address] [place], [place] [address]    Gram Stain   Final    NO SQUAMOUS  EPITHELIAL CELLS SEEN FEW WBC SEEN FEW GRAM POSITIVE RODS FEW GRAM NEGATIVE RODS FEW GRAM POSITIVE COCCI Performed at [facility], [place] [address] [place], [place] [address]    Culture (A)  Final    MULTIPLE ORGANISMS PRESENT, NONE PREDOMINANT NO ANAEROBES ISOLATED; CULTURE IN PROGRESS FOR 5 DAYS    Report Status PENDING  Incomplete    Impression/Plan:  1. 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  Plans for further surgery today per Dr. Sherryle Lis.   Continue with penicillin.  Will decide on plan this weekend after surgical intervention.  Dr. Baxter Flattery on tomorrow  2.  Diabetes - Hgb A1c very elevated and he will need much improved blood sugar control for healing.    3.  Bacteremia - group B STrep from #1 and on penicillin.  TTE without obvious concerns for vegetation.  Repeat blood cultures ngtd.

## 2022-03-28 NOTE — Brief Op Note (Signed)
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  PRE-OPERATIVE DIAGNOSIS:  diabetic foot infection, open wound  POST-OPERATIVE DIAGNOSIS:  diabetic foot  PROCEDURE:  Procedure(s): AMPUTATION LISFRANC, SECONDARY WOUND (Left) ACHILLES LENGTHENING/KIDNER (Left)  SURGEON:  Surgeon(s) and Role:    * Zury Fazzino, Stephan Minister, DPM - Primary    ASSISTANTS: none   ANESTHESIA:   regional and general  EBL:  10 mL   BLOOD ADMINISTERED:none  DRAINS: (left foot) Jackson-Pratt drain(s) with closed bulb suction in the      LOCAL MEDICATIONS USED:  NONE  SPECIMEN:  metatarsals  DISPOSITION OF SPECIMEN:  PATHOLOGY  COUNTS:  YES  TOURNIQUET:  * Missing tourniquet times found for documented tourniquets in log: [id number] *  DICTATION: .Note written in EPIC  PLAN OF CARE: Admit to inpatient   PATIENT DISPOSITION:  PACU - hemodynamically stable.   Delay start of Pharmacological VTE agent (>24hrs) due to surgical blood loss or risk of bleeding: yes

## 2022-03-28 NOTE — Progress Notes (Signed)
Pt returned to room from PACU. Report given. Pt VS obtained. Dressing observed. Pt reports no pain at this time.

## 2022-03-29 LAB — BASIC METABOLIC PANEL
Anion gap: 8 (ref 5–15)
BUN: 21 mg/dL (ref 8–23)
CO2: 20 mmol/L — ABNORMAL LOW (ref 22–32)
Calcium: 7.6 mg/dL — ABNORMAL LOW (ref 8.9–10.3)
Chloride: 102 mmol/L (ref 98–111)
Creatinine, Ser: 1.12 mg/dL (ref 0.61–1.24)
GFR, Estimated: >60 mL/min (ref >60)
Glucose, Bld: 437 mg/dL — ABNORMAL HIGH (ref 70–99)
Potassium: 5.1 mmol/L (ref 3.5–5.1)
Sodium: 130 mmol/L — ABNORMAL LOW (ref 135–145)

## 2022-03-29 LAB — GLUCOSE, CAPILLARY
Glucose-Capillary: 441 mg/dL — ABNORMAL HIGH (ref 70–99)
Glucose-Capillary: 426 mg/dL — ABNORMAL HIGH (ref 70–99)
Glucose-Capillary: 256 mg/dL — ABNORMAL HIGH (ref 70–99)
Glucose-Capillary: 162 mg/dL — ABNORMAL HIGH (ref 70–99)

## 2022-03-29 LAB — CBC
HCT: 28 % — ABNORMAL LOW (ref 39.0–52.0)
Hemoglobin: 9.1 g/dL — ABNORMAL LOW (ref 13.0–17.0)
MCH: 30.7 pg (ref 26.0–34.0)
MCHC: 32.5 g/dL (ref 30.0–36.0)
MCV: 94.6 fL (ref 80.0–100.0)
Platelets: 162 10*3/uL (ref 150–400)
RBC: 2.96 MIL/uL — ABNORMAL LOW (ref 4.22–5.81)
RDW: 12.5 % (ref 11.5–15.5)
WBC: 6.9 10*3/uL (ref 4.0–10.5)
nRBC: 0 % (ref 0.0–0.2)

## 2022-03-29 LAB — MAGNESIUM: Magnesium: 1.9 mg/dL (ref 1.7–2.4)

## 2022-03-29 MED ORDER — INSULIN ASPART 100 UNIT/ML IJ SOLN: 0.0000 [IU] | Freq: Every day | INTRAMUSCULAR | Status: DC

## 2022-03-29 MED ORDER — INSULIN GLARGINE-YFGN 100 UNIT/ML ~~LOC~~ SOLN
40.0000 [IU] | Freq: Every day | SUBCUTANEOUS | Status: DC
  Administered 2022-03-29 – 2022-04-04 (×7): 40 [IU] via SUBCUTANEOUS
  Filled 2022-03-29 (×7): qty 0.4

## 2022-03-29 MED ORDER — GLIPIZIDE 5 MG PO TABS
5.0000 mg | ORAL_TABLET | Freq: Every day | ORAL | Status: DC
  Administered 2022-03-29 – 2022-04-04 (×7): 5 mg via ORAL
  Filled 2022-03-29 (×7): qty 1

## 2022-03-29 MED ORDER — INSULIN ASPART 100 UNIT/ML IJ SOLN
0.0000 [IU] | Freq: Three times a day (TID) | INTRAMUSCULAR | Status: DC
  Administered 2022-03-29: 11 [IU] via SUBCUTANEOUS
  Administered 2022-03-30: 4 [IU] via SUBCUTANEOUS
  Administered 2022-03-30: 7 [IU] via SUBCUTANEOUS
  Administered 2022-03-31 – 2022-04-01 (×2): 3 [IU] via SUBCUTANEOUS
  Administered 2022-04-01: 4 [IU] via SUBCUTANEOUS
  Administered 2022-04-02: 3 [IU] via SUBCUTANEOUS
  Administered 2022-04-02: 4 [IU] via SUBCUTANEOUS
  Administered 2022-04-02: 7 [IU] via SUBCUTANEOUS
  Administered 2022-04-03 – 2022-04-04 (×3): 4 [IU] via SUBCUTANEOUS

## 2022-03-29 MED ORDER — FAMOTIDINE 20 MG PO TABS
20.0000 mg | ORAL_TABLET | Freq: Two times a day (BID) | ORAL | Status: DC
  Administered 2022-03-29 – 2022-04-04 (×12): 20 mg via ORAL
  Filled 2022-03-29 (×12): qty 1

## 2022-03-29 NOTE — Progress Notes (Signed)
  Subjective:  Patient ID: Robert Burnett, male    DOB: 1961-06-11,  MRN: [id number]  POD #1 status post Lisfranc amputation, TAL, secondary wound closure  Feeling okay.  He is having some pain in his back and shoulder  Negative for chest pain and shortness of breath Fever: no Constitutional signs: no Review of all other systems is negative Objective:   Vitals:   03/29/22 0455 03/29/22 1415  BP: 126/85 104/63  Pulse: 78 64  Resp: 20 18  Temp: (!) 97.5 F (36.4 C) 98.3 F (36.8 C)  SpO2: 100% 98%   General AA&O x3. Normal mood and affect.  Vascular Dorsalis pedis and posterior tibial pulses 2/4 bilat. Brisk capillary refill to all digits. Pedal hair present.  Neurologic Epicritic sensation grossly intact.  Dermatologic Splint is clean dry and intact.  JP drain with bloody drainage, approximately 50 cc in bulb  Orthopedic: MMT 5/5 in dorsiflexion, plantarflexion, inversion, and eversion. Normal joint ROM without pain or crepitus.          Assessment & Plan:  Patient was evaluated and treated and all questions answered.  Severe diabetic foot infection left foot -Overall doing well.  I will change the splint and remove the drain tomorrow. -  I was able to eradicate the remaining infection and the resection margins appeared healthy and clean.   -Expect he will be able to be discharged on 2 weeks of p.o. antibiotics, culture so far not growing any predominant organism may need to send on broad-spectrum. -I discussed this emergent situation with him in the recovery process.  He will need to be completely nonweightbearing on the left foot.  He lives by himself and I do not think he likely will be able to safely take care of himself at home.  I would recommend discharge to SNF or SAR if possible -I discussed the above with the patient and his cousin [name] as well by phone  [name], DPM  Accessible via secure chat for questions or concerns.

## 2022-03-29 NOTE — Progress Notes (Addendum)
PROGRESS NOTE    Robert Burnett  GXQ:[id number] DOB: [birth year] DOA: 03/24/2022 PCP: [name], MD   Brief Narrative:  61 y.o. male with medical history significant of seasonal allergies, unspecified anemia, DDD, asthma, cigarette smoker, type II DM, diabetic peripheral neuropathy, nonalcoholic fatty liver disease, GERD, hyperlipidemia, hypertension, history of pneumonia x3, urothelial cancer who is coming to the emergency department due to worsening of left foot wound for the past 2 weeks, but particularly a lot worse in the last 2 days that it has changing color and been draining a foul-smelling discharge after he kept the wrap for a week.  Upon admission patient was noted to be hyperglycemic with evidence of gangrene left foot, necrotizing infection.  Podiatry was consulted and he underwent transmetatarsal left amputation with I&D on 6/6.  Eventually his blood cultures grew group B strep bacteremia, ID was consulted and patient was started on IV penicillin.  Patient underwent repeat amputation by podiatry on 6/9.  Assessment & Plan:  Principal Problem:   Gangrene of left foot (HCC) Active Problems:   Diabetic peripheral neuropathy associated with type 2 diabetes mellitus (HCC)   Thoracic spondylosis with myelopathy   Type 2 diabetes mellitus with left diabetic foot infection (HCC)   Hypertension   Fatty liver disease, nonalcoholic   Pyogenic inflammation of bone (HCC)   Necrotizing soft tissue infection   Hypokalemia   Protein-calorie malnutrition, severe (HCC)   Abscess of tendon sheath, left ankle and foot     Severe left foot diabetic infection with wet gangrene and necrotizing infection with osteomyelitis Abscess noted in the area as well - Status post transmetatarsal amputation of the left foot with I&D by podiatry on 6/6.  Status post OR again by podiatry on 6/9 with Lisfranc amputation. -Pain control, ID consulted.  - On IV penicillin. -[place] ABI - normal. No further vascular  eval for now, can follow up outptn .  Group B strep bacteremia - Continue current IV antibiotics.  Repeat cultures-no growth to date.  No need for TEE per ID - Echocardiogram-normal without evidence of vegetations - IV penicillin  Diabetes mellitus type 2 with severe hyperglycemia - On home patient is on metformin 500 mg 3 times daily.  Hemoglobin A1c is 14.2.  Increase Semglee to 40 units daily, sliding scale and Accu-Chek.  NovoLog 4 units premeals. Add Glipizide '5mg'$  po daily   Thoracic spondylosis with myelopathy - Pain control  Essential hypertension - Atenolol  Nonalcoholic fatty liver disease - Monitor LFTs  Hypokalemia - Replete as necessary  Severe protein calorie malnutrition - Nutrition evaluation  DVT prophylaxis: SCDs Start: 03/25/22 0411  Code Status: Full  Family Communication: Spoke with patient's family members over the phone  Status is: Inpatient Remains inpatient appropriate because: Maintain hospital stay on antibiotics, discharge once cleared by podiatry and infectious disease.  In the meantime ongoing management for his blood glucose.  Subjective: Tolerated surgery well yesterday, no complaints this morning.  Sugars were high this morning after he drank cranberry juice.  Examination: Constitutional: Not in acute distress Respiratory: Clear to auscultation bilaterally Cardiovascular: Normal sinus rhythm, no rubs Abdomen: Nontender nondistended good bowel sounds Musculoskeletal: No edema noted Skin: No rashes seen Neurologic: CN 2-12 grossly intact.  And nonfocal Psychiatric: Normal judgment and insight. Alert and oriented x 3. Normal mood. Surgical site of the foot noted, drain and dressing in place.   Objective: Vitals:   03/28/22 2126 03/28/22 2139 03/29/22 0455 03/29/22 0500  BP:  106/74 126/85  Pulse: 65 67 78   Resp: '15 20 20   '$ Temp: 98.9 F (37.2 C) 98.5 F (36.9 C) (!) 97.5 F (36.4 C)   TempSrc:  Oral Oral   SpO2: 97% 98% 100%    Weight:    86.5 kg  Height:        Intake/Output Summary (Last 24 hours) at 03/29/2022 0849 Last data filed at 03/29/2022 0504 Gross per 24 hour  Intake 4086.49 ml  Output 1190 ml  Net 2896.49 ml   Filed Weights   03/28/22 0500 03/28/22 1713 03/29/22 0500  Weight: 80.9 kg 83.9 kg 86.5 kg     Data Reviewed:   CBC: Recent Labs  Lab 03/24/22 0030 03/25/22 0151 03/25/22 1011 03/27/22 0421 03/28/22 0417 03/29/22 0722  WBC 11.2*  --  11.9* 6.5 6.3 6.9  NEUTROABS 9.6*  --  10.0*  --   --   --   HGB 12.1* 12.6* 11.9* 9.3* 9.0* 9.1*  HCT 34.8* 37.0* 35.4* 27.9* 27.5* 28.0*  MCV 89.5  --  93.2 92.7 94.5 94.6  PLT 231  --  259 142* 150 427   Basic Metabolic Panel: Recent Labs  Lab 03/25/22 1151 03/25/22 1643 03/26/22 0816 03/27/22 0421 03/28/22 0417 03/29/22 0722  NA 131* 133* 135 131* 131* 130*  K 3.1* 3.1* 2.8* 3.5 3.5 5.1  CL 96* 99 104 99 100 102  CO2 22 24 20* 23 24 20*  GLUCOSE 252* 385* 147* 385* 296* 437*  BUN 44* 37* 21 24* 21 21  CREATININE 1.21 1.05 1.07 1.30* 1.13 1.12  CALCIUM 8.9 8.9 8.4* 8.0* 7.8* 7.6*  MG 1.8  --   --  1.9 2.0 1.9   GFR: Estimated Creatinine Clearance: 76 mL/min (by C-G formula based on SCr of 1.12 mg/dL). Liver Function Tests: Recent Labs  Lab 03/25/22 0030 03/25/22 1151  AST 27 19  ALT 29 22  ALKPHOS 100 71  BILITOT 0.9 0.8  PROT 8.2* 6.8  ALBUMIN 2.7* 2.3*   No results for input(s): "LIPASE", "AMYLASE" in the last 168 hours. No results for input(s): "AMMONIA" in the last 168 hours. Coagulation Profile: No results for input(s): "INR", "PROTIME" in the last 168 hours. Cardiac Enzymes: No results for input(s): "CKTOTAL", "CKMB", "CKMBINDEX", "TROPONINI" in the last 168 hours. BNP (last 3 results) No results for input(s): "PROBNP" in the last 8760 hours. HbA1C: No results for input(s): "HGBA1C" in the last 72 hours.  CBG: Recent Labs  Lab 03/28/22 1141 03/28/22 1556 03/28/22 1844 03/28/22 2056 03/29/22 0804   GLUCAP 265* 143* 130* 149* 441*   Lipid Profile: No results for input(s): "CHOL", "HDL", "LDLCALC", "TRIG", "CHOLHDL", "LDLDIRECT" in the last 72 hours. Thyroid Function Tests: No results for input(s): "TSH", "T4TOTAL", "FREET4", "T3FREE", "THYROIDAB" in the last 72 hours. Anemia Panel: No results for input(s): "VITAMINB12", "FOLATE", "FERRITIN", "TIBC", "IRON", "RETICCTPCT" in the last 72 hours. Sepsis Labs: Recent Labs  Lab 03/25/22 0051  LATICACIDVEN 1.6    Recent Results (from the past 240 hour(s))  Blood Cultures x 2 sites     Status: Abnormal   Collection Time: 03/25/22 12:30 AM   Specimen: BLOOD RIGHT WRIST  Result Value Ref Range Status   Specimen Description   Final    BLOOD RIGHT WRIST Performed at [facility] [address], [place], [place] [address]    Special Requests   Final    BOTTLES DRAWN AEROBIC AND ANAEROBIC Blood Culture adequate volume Performed at [facility], [address] [place], [place] [address]    Culture  Setup Time   Final    GRAM POSITIVE COCCI IN BOTH AEROBIC AND ANAEROBIC BOTTLES CRITICAL RESULT CALLED TO, READ BACK BY AND VERIFIED WITH: PHARMD CHRISTINE SHADE ON 03/25/22 @ 1625 BY DRT Performed at [facility], [place] [address] [place], [place] [address]    Culture GROUP B STREP(S.AGALACTIAE)ISOLATED (A)  Final   Report Status 03/27/2022 FINAL  Final   Organism ID, Bacteria GROUP B STREP(S.AGALACTIAE)ISOLATED  Final      Susceptibility   Group b strep(s.agalactiae)isolated - MIC*    CLINDAMYCIN >=1 RESISTANT Resistant     AMPICILLIN <=0.25 SENSITIVE Sensitive     ERYTHROMYCIN >=8 RESISTANT Resistant     VANCOMYCIN 0.5 SENSITIVE Sensitive     CEFTRIAXONE <=0.12 SENSITIVE Sensitive     LEVOFLOXACIN 0.5 SENSITIVE Sensitive     * GROUP B STREP(S.AGALACTIAE)ISOLATED  Blood Culture ID Panel (Reflexed)     Status: Abnormal   Collection Time: 03/25/22 12:30 AM  Result Value Ref Range Status    Enterococcus faecalis NOT DETECTED NOT DETECTED Final   Enterococcus Faecium NOT DETECTED NOT DETECTED Final   Listeria monocytogenes NOT DETECTED NOT DETECTED Final   Staphylococcus species NOT DETECTED NOT DETECTED Final   Staphylococcus aureus (BCID) NOT DETECTED NOT DETECTED Final   Staphylococcus epidermidis NOT DETECTED NOT DETECTED Final   Staphylococcus lugdunensis NOT DETECTED NOT DETECTED Final   Streptococcus species DETECTED (A) NOT DETECTED Final    Comment: CRITICAL RESULT CALLED TO, READ BACK BY AND VERIFIED WITH: PHARMD CHRISTINE SHADE ON 03/25/22 @ 1625 BY DRT    Streptococcus agalactiae DETECTED (A) NOT DETECTED Final    Comment: CRITICAL RESULT CALLED TO, READ BACK BY AND VERIFIED WITH: PHARMD CHRISTINE SHADE ON 03/25/22 @ 1625 BY DRT    Streptococcus pneumoniae NOT DETECTED NOT DETECTED Final   Streptococcus pyogenes NOT DETECTED NOT DETECTED Final   A.calcoaceticus-baumannii NOT DETECTED NOT DETECTED Final   Bacteroides fragilis NOT DETECTED NOT DETECTED Final   Enterobacterales NOT DETECTED NOT DETECTED Final   Enterobacter cloacae complex NOT DETECTED NOT DETECTED Final   Escherichia coli NOT DETECTED NOT DETECTED Final   Klebsiella aerogenes NOT DETECTED NOT DETECTED Final   Klebsiella oxytoca NOT DETECTED NOT DETECTED Final   Klebsiella pneumoniae NOT DETECTED NOT DETECTED Final   Proteus species NOT DETECTED NOT DETECTED Final   Salmonella species NOT DETECTED NOT DETECTED Final   Serratia marcescens NOT DETECTED NOT DETECTED Final   Haemophilus influenzae NOT DETECTED NOT DETECTED Final   Neisseria meningitidis NOT DETECTED NOT DETECTED Final   Pseudomonas aeruginosa NOT DETECTED NOT DETECTED Final   Stenotrophomonas maltophilia NOT DETECTED NOT DETECTED Final   Candida albicans NOT DETECTED NOT DETECTED Final   Candida auris NOT DETECTED NOT DETECTED Final   Candida glabrata NOT DETECTED NOT DETECTED Final   Candida krusei NOT DETECTED NOT DETECTED Final    Candida parapsilosis NOT DETECTED NOT DETECTED Final   Candida tropicalis NOT DETECTED NOT DETECTED Final   Cryptococcus neoformans/gattii NOT DETECTED NOT DETECTED Final    Comment: Performed at [facility], [place] [address], [place], [place] [address]  Blood Cultures x 2 sites     Status: Abnormal   Collection Time: 03/25/22 12:41 AM   Specimen: BLOOD LEFT HAND  Result Value Ref Range Status   Specimen Description   Final    BLOOD LEFT HAND Performed at [facility] [address] [place],  [place] [address]    Special Requests   Final    BOTTLES DRAWN AEROBIC ONLY Blood Culture results may not be optimal due to an inadequate volume of blood received in culture bottles Performed at [facility] [address] [place], [place] [address]    Culture (A)  Final    GROUP B STREP(S.AGALACTIAE)ISOLATED SUSCEPTIBILITIES PERFORMED ON PREVIOUS CULTURE WITHIN THE LAST 5 DAYS. Performed at [facility], [place] [address], [place], [place] [address]    Report Status 03/27/2022 FINAL  Final  MRSA Next Gen by PCR, Nasal     Status: Abnormal   Collection Time: 03/25/22  5:48 AM   Specimen: Nasal Mucosa; Nasal Swab  Result Value Ref Range Status   MRSA by PCR Next Gen DETECTED (A) NOT DETECTED Final    Comment: (NOTE) The GeneXpert MRSA Assay (FDA approved for NASAL specimens only), is one component of a comprehensive MRSA colonization surveillance program. It is not intended to diagnose MRSA infection nor to guide or monitor treatment for MRSA infections. Test performance is not FDA approved in patients less than 62 years old. Performed at [facility], [place] [address] [place], [place] [address]   Aerobic/Anaerobic Culture w Gram Stain (surgical/deep wound)     Status: Abnormal (Preliminary result)   Collection Time: 03/25/22  7:28 PM   Specimen: Wound; Abscess  Result Value Ref Range Status   Specimen Description   Final     ABSCESS FOOT LEFT Performed at [facility] [address] [place], [place] [address]    Special Requests   Final    NONE Performed at [facility], [place] [address], [place], [place] [address]    Gram Stain   Final    NO SQUAMOUS EPITHELIAL CELLS SEEN FEW WBC SEEN FEW GRAM POSITIVE RODS FEW GRAM NEGATIVE RODS FEW GRAM POSITIVE COCCI Performed at [facility], [place] [address], [place], [place] [address]    Culture (A)  Final    MULTIPLE ORGANISMS PRESENT, NONE PREDOMINANT NO ANAEROBES ISOLATED; CULTURE IN PROGRESS FOR 5 DAYS    Report Status PENDING  Incomplete  Culture, blood (Routine X 2) w Reflex to ID Panel     Status: None (Preliminary result)   Collection Time: 03/27/22  8:47 AM   Specimen: BLOOD LEFT WRIST  Result Value Ref Range Status   Specimen Description   Final    BLOOD LEFT WRIST Performed at [facility] [address] [place], [place] [address]    Special Requests   Final    BOTTLES DRAWN AEROBIC AND ANAEROBIC Blood Culture results may not be optimal due to an inadequate volume of blood received in culture bottles Performed at [facility] [address], [place], [place] [address]    Culture   Final    NO GROWTH 1 DAY Performed at [facility], [place] [address], [place], [place] [address]    Report Status PENDING  Incomplete  Culture, blood (Routine X 2) w Reflex to ID Panel     Status: None (Preliminary result)   Collection Time: 03/27/22  8:47 AM   Specimen: BLOOD  Result Value Ref Range Status   Specimen Description BLOOD BLOOD LEFT WRIST  Final   Special Requests   Final    BOTTLES DRAWN AEROBIC AND ANAEROBIC Blood Culture adequate volume   Culture   Final    NO GROWTH 1 DAY Performed at [facility], [place] [address], [place], [place] [address]    Report Status PENDING  Incomplete         Radiology Studies: ECHOCARDIOGRAM COMPLETE  Result  Date: 03/27/2022    ECHOCARDIOGRAM REPORT   Patient Name:   BETH SPACKMAN Date of Exam: 03/27/2022 Medical Rec #:  [id number]        Height:       72.0 in Accession #:    [id number]       Weight:       179.5 lb Date of Birth:  Oct 31, 1960         BSA:          2.035 m Patient Age:    55 years         BP:           114/71 mmHg Patient Gender: M                HR:           74 bpm. Exam Location:  Inpatient Procedure: 2D Echo, Cardiac Doppler and Color Doppler Indications:    Bacteremia  History:        Patient has no prior history of Echocardiogram examinations.                 Risk Factors:Hypertension and Diabetes.  Sonographer:    Jefferey Pica Referring Phys: [id number] Tashawn Laswell CHIRAG Callee Rohrig IMPRESSIONS  1. Left ventricular ejection fraction, by estimation, is 55 to 60%. The left ventricle has normal function. The left ventricle has no regional wall motion abnormalities. Left ventricular diastolic parameters were normal.  2. Right ventricular systolic function is normal. The right ventricular size is normal. There is mildly elevated pulmonary artery systolic pressure.  3. The mitral valve is normal in structure. Mild mitral valve regurgitation. No evidence of mitral stenosis.  4. The aortic valve is tricuspid. Aortic valve regurgitation is not visualized. No aortic stenosis is present.  5. The inferior vena cava is dilated in size with <50% respiratory variability, suggesting right atrial pressure of 15 mmHg. FINDINGS  Left Ventricle: Left ventricular ejection fraction, by estimation, is 55 to 60%. The left ventricle has normal function. The left ventricle has no regional wall motion abnormalities. The left ventricular internal cavity size was normal in size. There is  no left ventricular hypertrophy. Left ventricular diastolic parameters were normal. Indeterminate filling pressures. Right Ventricle: The right ventricular size is normal. No increase in right ventricular wall thickness. Right ventricular systolic  function is normal. There is mildly elevated pulmonary artery systolic pressure. The tricuspid regurgitant velocity is 2.34  m/s, and with an assumed right atrial pressure of 15 mmHg, the estimated right ventricular systolic pressure is 54.2 mmHg. Left Atrium: Left atrial size was normal in size. Right Atrium: Right atrial size was normal in size. Pericardium: There is no evidence of pericardial effusion. Mitral Valve: The mitral valve is normal in structure. Mild mitral valve regurgitation. No evidence of mitral valve stenosis. Tricuspid Valve: The tricuspid valve is normal in structure. Tricuspid valve regurgitation is mild . No evidence of tricuspid stenosis. Aortic Valve: The aortic valve is tricuspid. Aortic valve regurgitation is not visualized. No aortic stenosis is present. Aortic valve peak gradient measures 7.1 mmHg. Pulmonic Valve: The pulmonic valve was normal in structure. Pulmonic valve regurgitation is not visualized. No evidence of pulmonic stenosis. Aorta: The aortic root is normal in size and structure. Venous: The inferior vena cava is dilated in size  with less than 50% respiratory variability, suggesting right atrial pressure of 15 mmHg. IAS/Shunts: No atrial level shunt detected by color flow Doppler.  LEFT VENTRICLE PLAX 2D LVIDd:         5.20 cm   Diastology LVIDs:         3.80 cm   LV e' medial:    7.80 cm/s LV PW:         1.20 cm   LV E/e' medial:  13.1 LV IVS:        1.00 cm   LV e' lateral:   11.10 cm/s LVOT diam:     2.10 cm   LV E/e' lateral: 9.2 LV SV:         78 LV SV Index:   38 LVOT Area:     3.46 cm  RIGHT VENTRICLE             IVC RV Basal diam:  3.20 cm     IVC diam: 2.60 cm RV Mid diam:    3.50 cm RV S prime:     12.60 cm/s TAPSE (M-mode): 1.9 cm LEFT ATRIUM             Index        RIGHT ATRIUM           Index LA diam:        4.30 cm 2.11 cm/m   RA Area:     17.90 cm LA Vol (A2C):   41.0 ml 20.15 ml/m  RA Volume:   45.70 ml  22.46 ml/m LA Vol (A4C):   54.2 ml 26.64 ml/m LA  Biplane Vol: 49.5 ml 24.33 ml/m  AORTIC VALVE                 PULMONIC VALVE AV Area (Vmax): 3.14 cm     PV Vmax:       0.69 m/s AV Vmax:        133.50 cm/s  PV Peak grad:  1.9 mmHg AV Peak Grad:   7.1 mmHg LVOT Vmax:      121.00 cm/s LVOT Vmean:     76.100 cm/s LVOT VTI:       0.225 m  AORTA Ao Root diam: 3.40 cm Ao Asc diam:  3.50 cm MITRAL VALVE                TRICUSPID VALVE MV Area (PHT): 4.57 cm     TR Peak grad:   21.9 mmHg MV Decel Time: 166 msec     TR Vmax:        234.00 cm/s MR Peak grad: 71.6 mmHg MR Vmax:      423.00 cm/s   SHUNTS MV E velocity: 102.00 cm/s  Systemic VTI:  0.22 m MV A velocity: 61.70 cm/s   Systemic Diam: 2.10 cm MV E/A ratio:  1.65 Skeet Latch MD Electronically signed by Skeet Latch MD Signature Date/Time: 03/27/2022/5:31:26 PM    Final         Scheduled Meds:  atenolol  100 mg Oral Daily   atorvastatin  80 mg Oral Daily   B-complex with vitamin C  1 tablet Oral Daily   fentaNYL  1 patch Transdermal Q72H   finasteride  5 mg Oral Daily   HYDROmorphone       insulin aspart  0-15 Units Subcutaneous TID WC   insulin aspart  0-5 Units Subcutaneous QHS   insulin aspart  4 Units Subcutaneous TID WC   insulin glargine-yfgn  40 Units Subcutaneous Daily   mupirocin ointment   Nasal BID   nutrition supplement (JUVEN)  1 packet Oral BID BM   Continuous Infusions:  sodium chloride 75 mL/hr at 03/29/22 0501   penicillin G potassium 12 Million Units in dextrose 5 % 500 mL continuous infusion 12 Million Units (03/29/22 0504)   sodium chloride Stopped (03/25/22 2309)     LOS: 4 days   Time spent= 35 mins    Jahred Tatar Arsenio Loader, MD [facility]  If 7PM-7AM, please contact night-coverage  03/29/2022, 8:49 AM

## 2022-03-29 NOTE — Anesthesia Postprocedure Evaluation (Signed)
Anesthesia Post Note  Patient: Robert Burnett  Procedure(s) Performed: AMPUTATION LISFRANC, SECONDARY WOUND CLOSURE (Left) ACHILLES LENGTHENING/KIDNER (Left)     Patient location during evaluation: PACU Anesthesia Type: Regional and General Level of consciousness: awake and alert Pain management: pain level controlled Vital Signs Assessment: post-procedure vital signs reviewed and stable Respiratory status: spontaneous breathing, nonlabored ventilation, respiratory function stable and patient connected to nasal cannula oxygen Cardiovascular status: blood pressure returned to baseline and stable Postop Assessment: no apparent nausea or vomiting Anesthetic complications: no   No notable events documented.  Last Vitals:  Vitals:   03/29/22 0455 03/29/22 1415  BP: 126/85 104/63  Pulse: 78 64  Resp: 20 18  Temp: (!) 36.4 C 36.8 C  SpO2: 100% 98%    Last Pain:  Vitals:   03/29/22 1415  TempSrc: Oral  PainSc:                  March Rummage Tiani Stanbery

## 2022-03-30 ENCOUNTER — Encounter (HOSPITAL_COMMUNITY): Payer: Self-pay | Admitting: Podiatry

## 2022-03-30 LAB — CBC
HCT: 26.5 % — ABNORMAL LOW (ref 39.0–52.0)
Hemoglobin: 8.5 g/dL — ABNORMAL LOW (ref 13.0–17.0)
MCH: 30.7 pg (ref 26.0–34.0)
MCHC: 32.1 g/dL (ref 30.0–36.0)
MCV: 95.7 fL (ref 80.0–100.0)
Platelets: 170 10*3/uL (ref 150–400)
RBC: 2.77 MIL/uL — ABNORMAL LOW (ref 4.22–5.81)
RDW: 12.5 % (ref 11.5–15.5)
WBC: 5.5 10*3/uL (ref 4.0–10.5)
nRBC: 0 % (ref 0.0–0.2)

## 2022-03-30 LAB — BASIC METABOLIC PANEL
Anion gap: 6 (ref 5–15)
BUN: 21 mg/dL (ref 8–23)
CO2: 24 mmol/L (ref 22–32)
Calcium: 8.1 mg/dL — ABNORMAL LOW (ref 8.9–10.3)
Chloride: 106 mmol/L (ref 98–111)
Creatinine, Ser: 1.19 mg/dL (ref 0.61–1.24)
GFR, Estimated: >60 mL/min (ref >60)
Glucose, Bld: 147 mg/dL — ABNORMAL HIGH (ref 70–99)
Potassium: 4.2 mmol/L (ref 3.5–5.1)
Sodium: 136 mmol/L (ref 135–145)

## 2022-03-30 LAB — GLUCOSE, CAPILLARY
Glucose-Capillary: 151 mg/dL — ABNORMAL HIGH (ref 70–99)
Glucose-Capillary: 218 mg/dL — ABNORMAL HIGH (ref 70–99)
Glucose-Capillary: 88 mg/dL (ref 70–99)
Glucose-Capillary: 131 mg/dL — ABNORMAL HIGH (ref 70–99)

## 2022-03-30 LAB — MAGNESIUM: Magnesium: 1.9 mg/dL (ref 1.7–2.4)

## 2022-03-30 MED ORDER — INSULIN ASPART 100 UNIT/ML IJ SOLN
3.0000 [IU] | Freq: Three times a day (TID) | INTRAMUSCULAR | Status: DC
Start: 2022-03-30 — End: 2022-04-04
  Administered 2022-03-30 – 2022-04-04 (×15): 3 [IU] via SUBCUTANEOUS

## 2022-03-30 MED ORDER — INSULIN ASPART 100 UNIT/ML IJ SOLN: 6.0000 [IU] | Freq: Three times a day (TID) | INTRAMUSCULAR | Status: DC

## 2022-03-30 NOTE — Op Note (Signed)
Patient Name: Robert Burnett DOB: 07/29/1961  MRN: [id number]   Date of Service: 03/28/2022  Surgeon: Dr. Lanae Crumbly, DPM Assistants: None Pre-operative Diagnosis:  1) diabetic foot infection left foot 2) left foot osteomyelitis 3) open surgical wound left foot 4) equinus contracture of left foot Post-operative Diagnosis:  1) diabetic foot infection left foot 2) left foot osteomyelitis 3) open surgical wound left foot 4) equinus contracture of left foot Procedures:  1) amputation Lisfranc and tarsal left foot  2) delayed secondary closure of surgical wound  3) tendo Achilles lengthening Pathology/Specimens: ID Type Source Tests Collected by Time Destination  1 : left foot metatarsals Tissue PATH Other SURGICAL PATHOLOGY [name], [facility] 03/28/2022 1959    Anesthesia: General anesthesia with regional block Hemostasis:  Total Tourniquet Time Documented: Thigh (Left) - 67 minutes Total: Thigh (Left) - 67 minutes  Estimated Blood Loss: 10 mL Materials: * No implants in log * Medications: None Complications: None  Indications for Procedure:  This is a 61 y.o. male with a history of previous severe diabetic foot infection he underwent transmetatarsal amputation left open and negative pressure wound therapy applied earlier in this admission.  He returns today for wound closure, revision amputation and tendo Achilles lengthening.  This is a second of a two-stage procedure. Procedure in Detail: Patient was identified in pre-operative holding area. Formal consent was signed and the left lower extremity was marked. Patient was brought back to the operating room. Anesthesia was induced. The extremity was prepped and draped in the usual sterile fashion. Timeout was taken to confirm patient name, laterality, and procedure prior to incision.   Attention was then directed to the left foot which exhibited the open surgical wound, the staples and sutures were removed and the flap was  inspected.  It exhibited some new necrosis at the area of rotation on the medial side.  The distal edges appeared to be dysvascular as well.  I determined that this portion of the flap would not survive and begin debridement and resection of the nonviable portions utilizing a scalpel in an excisional full-thickness manner.  Once the necrotic portions were resected I reevaluated the tissue flaps.  Primary tissue closure was not possible with the current amount of bone remaining.  Additionally his pathology showed likely osteomyelitis at the resection margin and I determined to remove the remaining metatarsals and proceed with a Lisfranc amputation.  I carried dissection through the deep tissues with a combination of sharp and blunt dissection as well as electrocautery to achieve hemostasis and exposed the bases of the metatarsals at the tarsometatarsal joint.  Combination of osteotome and sharp scalpel was used to remove the metatarsals from their soft tissue attachments of the tarsometatarsal joints.  The remaining metatarsal bones appeared to be healthy and viable.  The metatarsals were sent for pathologic analysis.  The remaining tissue was then further debrided of any nonviable areas.  Once adequate healthy tissue remained the foot was thoroughly irrigated with sterile saline and a pulse irrigator.  Complex layered flap closure with advancement and rotation of the medial flap, dorsal flap and plantar lateral flap was then carried out utilizing combination of 2-0 PDS 3-0 Monocryl 2-0 nylon and skin staples.  Negative pressure wound therapy or skin substitute were not necessary to be applied.  Once this was complete I directed my attention to the posterior Achilles where a Hoke triple hemisection was completed utilizing a scalpel percutaneously with gentle dorsiflexion placed upon the ankle until the contracture  was released.  These incisions were closed with skin staples.  The foot was then dressed with  Xeroform dry sterile gauze, ABD pad Kerlix and a well-padded below the knee splint.. Patient tolerated the procedure well.   Disposition: Following a period of post-operative monitoring, patient will be transferred to the floor.

## 2022-03-30 NOTE — Evaluation (Signed)
Occupational Therapy Evaluation Patient Details Name: Robert Burnett MRN: [id number] DOB: 20-Jul-1961 Today's Date: 03/30/2022   History of Present Illness 61 y.o. male admitted through emergency department due to worsening of left foot wound, evidence of gangrene left foot, necrotizing infection  Podiatry was consulted and he underwent transmetatarsal left amputation with I&D on 6/6.    PMH: seasonal allergies, unspecified anemia, DDD, asthma, cigarette smoker, type II DM, diabetic peripheral neuropathy, nonalcoholic fatty liver disease, GERD, hyperlipidemia, hypertension, history of pneumonia x3, urothelial cancer .   Clinical Impression   Patient is a pleasant 61 year old male who was admitted for above. Patient lived at home independently with nephew who works during the day.  Patient was noted to have had a significant change in ability to complete ADLs with new NWB status of LLE. Patient was noted to have decreased functional activity tolerance, decreased endurance, decreased standing balance, decreased safety awareness, and decreased knowledge of AD/AE impacting participation in ADLs. Patient would continue to benefit from skilled OT services at this time while admitted and after d/c to address noted deficits in order to improve overall safety and independence in ADLs.        Recommendations for follow up therapy are one component of a multi-disciplinary discharge planning process, led by the attending physician.  Recommendations may be updated based on patient status, additional functional criteria and insurance authorization.   Follow Up Recommendations  Other (comment) (patient would benefit from SNF but has no insurance)    Assistance Recommended at Discharge Frequent or constant Supervision/Assistance  Patient can return home with the following A lot of help with walking and/or transfers;A lot of help with bathing/dressing/bathroom;Assistance with cooking/housework;Direct  supervision/assist for financial management;Assist for transportation;Help with stairs or ramp for entrance;Direct supervision/assist for medications management    Functional Status Assessment  Patient has had a recent decline in their functional status and demonstrates the ability to make significant improvements in function in a reasonable and predictable amount of time.  Equipment Recommendations  Other (comment) (drop arm BSC)    Recommendations for Other Services       Precautions / Restrictions Precautions Precautions: Fall Required Braces or Orthoses: Splint/Cast Splint/Cast: L ankle splint Restrictions Weight Bearing Restrictions: Yes LLE Weight Bearing: Non weight bearing      Mobility Bed Mobility Overal bed mobility: Needs Assistance Bed Mobility: Sit to Supine       Sit to supine: Min guard   General bed mobility comments: for lines and safety    Transfers                          Balance Overall balance assessment: Needs assistance Sitting-balance support: No upper extremity supported, Feet supported Sitting balance-[name]: Good     Standing balance support: During functional activity, Reliant on assistive device for balance Standing balance-[name]: Poor Standing balance comment: heavily reliant on device NWB status                           ADL either performed or assessed with clinical judgement   ADL Overall ADL's : Needs assistance/impaired Eating/Feeding: Modified independent;Sitting;Set up   Grooming: Set up;Sitting   Upper Body Bathing: Set up;Sitting   Lower Body Bathing: Sitting/lateral leans;Moderate assistance   Upper Body Dressing : Set up;Sitting   Lower Body Dressing: Moderate assistance;Sitting/lateral leans;Sit to/from stand   Toilet Transfer: Moderate assistance;Rolling walker (2 wheels) [employer] technical [employer]  Details (indicate cue type and reason): with continued cues for transfer from edge of  recliner to edge of bed. recliner in room does not have a drop arm on it. patient was educated on drop arm BSC Toileting- Clothing Manipulation and Hygiene: Maximal assistance;Sit to/from stand               Vision   Additional Comments: typically wears glasses     Perception     Praxis      Pertinent Vitals/Pain Pain Assessment Pain Assessment: 0-10 Pain Score: 6  Pain Location: L foot, chest Pain Descriptors / Indicators: Grimacing, Sore Pain Intervention(s): Limited activity within patient's tolerance, Monitored during session, Repositioned     Hand Dominance Right   Extremity/Trunk Assessment Upper Extremity Assessment Upper Extremity Assessment: Overall WFL for tasks assessed   Lower Extremity Assessment Lower Extremity Assessment: Defer to PT evaluation LLE Deficits / Details: knee and hip AROM grossly WFL, strength ~3/5, limited by pain LLE: Unable to fully assess due to immobilization;Unable to fully assess due to pain   Cervical / Trunk Assessment Cervical / Trunk Assessment: Normal   Communication Communication Communication: No difficulties   Cognition Arousal/Alertness: Awake/alert Behavior During Therapy: WFL for tasks assessed/performed Overall Cognitive Status: Within Functional Limits for tasks assessed                                       General Comments       Exercises     Shoulder Instructions      Home Living Family/patient expects to be discharged to:: Private residence Living Arrangements: Other relatives Available Help at Discharge: Family;Available PRN/intermittently Type of Home: House Home Access: Stairs to enter Entrance Stairs-Number of Steps: small step and 6 inch step   Home Layout: Two level;Able to live on main level with bedroom/bathroom               Home Equipment: Rolling Walker (2 wheels);Wheelchair - manual   Additional Comments: nephew works, just had hip surgery;      Prior  Functioning/Environment Prior Level of Function : Independent/Modified Independent                        OT Problem List: Decreased activity tolerance;Impaired balance (sitting and/or standing);Decreased safety awareness;Cardiopulmonary status limiting activity;Decreased knowledge of precautions;Decreased knowledge of use of DME or AE      OT Treatment/Interventions: Self-care/ADL training;Therapeutic exercise;Neuromuscular education;Energy conservation;DME and/or AE instruction;Therapeutic activities;Balance training;Patient/family education    OT Goals(Current goals can be found in the care plan section) Acute Rehab OT Goals Patient Stated Goal: to get back home OT Goal Formulation: With patient Time For Goal Achievement: 04/13/22 Potential to Achieve Goals: Fair  OT Frequency: Min 2X/week    Co-evaluation              AM-PAC OT "6 Clicks" Daily Activity     Outcome Measure Help from another person eating meals?: None Help from another person taking care of personal grooming?: None Help from another person toileting, which includes using toliet, bedpan, or urinal?: A Lot Help from another person bathing (including washing, rinsing, drying)?: A Lot Help from another person to put on and taking off regular upper body clothing?: A Little Help from another person to put on and taking off regular lower body clothing?: A Lot 6 Click Score: 17   End of Session Equipment  Utilized During Treatment: Gait belt;Rolling walker (2 wheels) Nurse Communication: Mobility status  Activity Tolerance: Patient tolerated treatment well Patient left: in bed;with call bell/phone within reach  OT Visit Diagnosis: Unsteadiness on feet (R26.81);Pain;Other abnormalities of gait and mobility (R26.89)                Time: 1420-1440 OT Time Calculation (min): 20 min Charges:  OT General Charges $OT Visit: 1 Visit OT Evaluation $OT Eval Moderate Complexity: 1 Mod  Jackelyn Poling OTR/L,  MS Acute Rehabilitation Department Office# [phone] Pager# [phone]   Marcellina Millin 03/30/2022, 4:00 PM

## 2022-03-30 NOTE — Evaluation (Signed)
Physical Therapy Evaluation Patient Details Name: Robert Burnett MRN: [id number] DOB: 02-24-1961 Today's Date: 03/30/2022  History of Present Illness  61 y.o. male admitted through emergency department due to worsening of left foot wound, evidence of gangrene left foot, necrotizing infection  Podiatry was consulted and he underwent transmetatarsal left amputation with I&D on 6/6.    PMH: seasonal allergies, unspecified anemia, DDD, asthma, cigarette smoker, type II DM, diabetic peripheral neuropathy, nonalcoholic fatty liver disease, GERD, hyperlipidemia, hypertension, history of pneumonia x3, urothelial cancer .  Clinical Impression  Pt admitted with above diagnosis.   Pt reports independence prior to this admission/surgeries. He has limited support at home. He needs SNF however has no insurance. Pt reports he has a w/c however cannot use it in the house, will discuss with him if his nephew could use w/c to get him in the house (has 2 separated steps)--he will likely not be able to afford PTAR transport home. Pt is quite weak and deconditioned at this time requiring min to mod assist overall, frequent cues and assist to adhere to NWB.  Will continue to follow in acute setting to maximize independence and safety for likely return home.  Possibly a knee scooter could maximize safety and IND if pt is able to afford this. Continue to assess and f/u with [facility] team for options.   Pt currently with functional limitations due to the deficits listed below (see PT Problem List). Pt will benefit from skilled PT to increase their independence and safety with mobility to allow discharge to the venue listed below.          Recommendations for follow up therapy are one component of a multi-disciplinary discharge planning process, led by the attending physician.  Recommendations may be updated based on patient status, additional functional criteria and insurance authorization.  Follow Up Recommendations Other  (comment) (pt will not have f/u-has no ins)    Assistance Recommended at Discharge Intermittent Supervision/Assistance  Patient can return home with the following  Help with stairs or ramp for entrance;Assist for transportation;Assistance with cooking/housework    Equipment Recommendations None recommended by PT  Recommendations for Other Services       Functional Status Assessment Patient has had a recent decline in their functional status and demonstrates the ability to make significant improvements in function in a reasonable and predictable amount of time.     Precautions / Restrictions Precautions Precautions: Fall Required Braces or Orthoses: Splint/Cast Splint/Cast: L ankle splint Restrictions LLE Weight Bearing: Non weight bearing      Mobility  Bed Mobility Overal bed mobility: Needs Assistance Bed Mobility: Supine to Sit     Supine to sit: Min guard     General bed mobility comments: for lines and safety, pt able to progress LLE off bed with incr time    Transfers Overall transfer level: Needs assistance Equipment used: Rolling walker (2 wheels) Transfers: Sit to/from Stand, Bed to chair/wheelchair/BSC Sit to Stand: Mod assist (bed not elevated, pt reports low bed at home)   Step pivot transfers: Min assist, Mod assist       General transfer comment: cues for hand placement, NWB LLE/position. assist with anterior-superior wt shift and to steady on rising and transition to RW. assist to balance and maintain NWB for pivotal steps bed to chair    Ambulation/Gait               General Gait Details: a few steps forward and back with min-mod assist for balance,  maneuvering RW and to maintain NWB  Stairs            Wheelchair Mobility    Modified Rankin (Stroke Patients Only)       Balance Overall balance assessment: Needs assistance Sitting-balance support: No upper extremity supported, Feet supported Sitting balance-[name]: Good      Standing balance support: During functional activity, Reliant on assistive device for balance Standing balance-[name]: Poor Standing balance comment: heavily reliant on device                             Pertinent Vitals/Pain Pain Assessment Pain Assessment: 0-10 Pain Score: 5  Pain Location: L foot, chest Pain Descriptors / Indicators: Grimacing, Sore Pain Intervention(s): Limited activity within patient's tolerance, Monitored during session, Premedicated before session, Repositioned    Home Living Family/patient expects to be discharged to:: Private residence   Available Help at Discharge: Family;Available PRN/intermittently (nephew assists some, has cousin that may be able to help) Type of Home: House Home Access: Stairs to enter   [employer] of Steps: small step and 6 inch step   Home Layout: Two level;Able to live on main level with bedroom/bathroom Home Equipment: Rolling Walker (2 wheels);Wheelchair - manual Additional Comments: nephew works, just had hip surgery;    Prior Function Prior Level of Function : Independent/Modified Independent                     Hand Dominance        Extremity/Trunk Assessment   Upper Extremity Assessment Upper Extremity Assessment: Defer to OT evaluation    Lower Extremity Assessment Lower Extremity Assessment: Generalized weakness;LLE deficits/detail LLE Deficits / Details: knee and hip AROM grossly WFL, strength ~3/5, limited by pain LLE: Unable to fully assess due to immobilization;Unable to fully assess due to pain       Communication   Communication: No difficulties  Cognition Arousal/Alertness: Awake/alert Behavior During Therapy: WFL for tasks assessed/performed Overall Cognitive Status: Within Functional Limits for tasks assessed                                          General Comments      Exercises     Assessment/Plan    PT Assessment Patient needs  continued PT services  PT Problem List Decreased strength;Decreased mobility;Decreased activity tolerance;Decreased balance;Decreased knowledge of use of DME;Pain       PT Treatment Interventions DME instruction;Therapeutic exercise;Gait training;Stair training;Functional mobility training;Therapeutic activities;Patient/family education;Balance training    PT Goals (Current goals can be found in the Care Plan section)  Acute Rehab PT Goals Patient Stated Goal: to get better, foot healed PT Goal Formulation: With patient Time For Goal Achievement: 04/13/22 Potential to Achieve Goals: Good    Frequency Min 4X/week     Co-evaluation               AM-PAC PT "6 Clicks" Mobility  Outcome Measure Help needed turning from your back to your side while in a flat bed without using bedrails?: A Little Help needed moving from lying on your back to sitting on the side of a flat bed without using bedrails?: A Little Help needed moving to and from a bed to a chair (including a wheelchair)?: A Lot Help needed standing up from a chair using your arms (e.g., wheelchair or  bedside chair)?: A Lot Help needed to walk in hospital room?: A Lot Help needed climbing 3-5 steps with a railing? : Total 6 Click Score: 13    End of Session Equipment Utilized During Treatment: Gait belt Activity Tolerance: Patient limited by fatigue;Patient limited by pain Patient left: in chair;with call bell/phone within reach;with chair alarm set (alarm off per RN d/t dying  batteries) Nurse Communication: Mobility status PT Visit Diagnosis: Other abnormalities of gait and mobility (R26.89);Difficulty in walking, not elsewhere classified (R26.2);Muscle weakness (generalized) (M62.81)    Time: 1916-6060 PT Time Calculation (min) (ACUTE ONLY): 24 min   Charges:   PT Evaluation $PT Eval Low Complexity: 1 Low PT Treatments $Therapeutic Activity: 8-22 mins        Baxter Flattery, PT  Acute Rehab Dept [facility])  [phone] Pager [phone]  03/30/2022   [facility] 03/30/2022, 12:57 PM

## 2022-03-30 NOTE — Progress Notes (Signed)
PROGRESS NOTE    Robert Burnett  ZOX:[id number] DOB: [birth year] DOA: 03/24/2022 PCP: [name], MD   Brief Narrative:  61 y.o. male with medical history significant of seasonal allergies, unspecified anemia, DDD, asthma, cigarette smoker, type II DM, diabetic peripheral neuropathy, nonalcoholic fatty liver disease, GERD, hyperlipidemia, hypertension, history of pneumonia x3, urothelial cancer who is coming to the emergency department due to worsening of left foot wound for the past 2 weeks, but particularly a lot worse in the last 2 days that it has changing color and been draining a foul-smelling discharge after he kept the wrap for a week.  Upon admission patient was noted to be hyperglycemic with evidence of gangrene left foot, necrotizing infection.  Podiatry was consulted and he underwent transmetatarsal left amputation with I&D on 6/6.  Eventually his blood cultures grew group B strep bacteremia, ID was consulted and patient was started on IV penicillin.  Patient underwent repeat amputation by podiatry on 6/9.  Assessment & Plan:  Principal Problem:   Gangrene of left foot (HCC) Active Problems:   Diabetic peripheral neuropathy associated with type 2 diabetes mellitus (HCC)   Thoracic spondylosis with myelopathy   Type 2 diabetes mellitus with left diabetic foot infection (HCC)   Hypertension   Fatty liver disease, nonalcoholic   Pyogenic inflammation of bone (HCC)   Necrotizing soft tissue infection   Hypokalemia   Protein-calorie malnutrition, severe (HCC)   Abscess of tendon sheath, left ankle and foot     Severe left foot diabetic infection with wet gangrene and necrotizing infection with osteomyelitis Abscess noted in the area as well - Status post transmetatarsal amputation of the left foot with I&D by podiatry on 6/6.  Status post OR again by podiatry on 6/9 with Lisfranc amputation.  According to podiatry they were able to eradicate remaining infection with clean margins.   Eventually plan on 2 weeks of oral antibiotic.  Cleared for PT OT with nonweightbearing on the left foot - Podiatry planning on splint and drain removal today. -Pain control, ID consulted.  - On IV penicillin. -[place] ABI - normal. No further vascular eval for now, can follow up outptn .  Group B strep bacteremia - Currently on IV penicillin repeat cultures-no growth to date.  No need for TEE per ID - Echocardiogram-normal without evidence of vegetations - ID following  Diabetes mellitus type 2 with severe hyperglycemia; improved this morning.  - On home patient is on metformin 500 mg 3 times daily.  Hemoglobin A1c is 14.2.  Increase Semglee to 40 units daily, sliding scale and Accu-Chek.  NovoLog 3 units premeals. Add Glipizide '5mg'$  po daily   Thoracic spondylosis with myelopathy - Pain control  Essential hypertension - Atenolol  Nonalcoholic fatty liver disease - Monitor LFTs  Hypokalemia - Replete as necessary  Severe protein calorie malnutrition - Nutrition evaluation  DVT prophylaxis: SCDs Start: 03/25/22 0411  Code Status: Full  Family Communication: Spoke with patient's family members over the phone  Status is: Inpatient Remains inpatient appropriate because: Continue IV antibiotics until cleared by infectious disease.  In the meantime patient will need PT/OT and safe disposition planning.  Subjective: No complaints this morning, feels okay.  Some discomfort at the surgical site as expected.  Examination: Constitutional: Not in acute distress Respiratory: Clear to auscultation bilaterally Cardiovascular: Normal sinus rhythm, no rubs Abdomen: Nontender nondistended good bowel sounds Musculoskeletal: No edema noted Skin: No rashes seen Neurologic: CN 2-12 grossly intact.  And nonfocal Psychiatric: Normal judgment  and insight. Alert and oriented x 3. Normal mood. Surgical site of the left foot noted, drain and dressing in place.   Objective: Vitals:   03/29/22 0500  03/29/22 1415 03/29/22 2101 03/30/22 0441  BP:  104/63 118/76 110/71  Pulse:  64 64 (!) 52  Resp:  '18 20 18  '$ Temp:  98.3 F (36.8 C) 98 F (36.7 C) 98.2 F (36.8 C)  TempSrc:  Oral Oral Oral  SpO2:  98% 99% 100%  Weight: 86.5 kg     Height:        Intake/Output Summary (Last 24 hours) at 03/30/2022 0849 Last data filed at 03/30/2022 0446 Gross per 24 hour  Intake 2448.29 ml  Output 1960 ml  Net 488.29 ml   Filed Weights   03/28/22 0500 03/28/22 1713 03/29/22 0500  Weight: 80.9 kg 83.9 kg 86.5 kg     Data Reviewed:   CBC: Recent Labs  Lab 03/24/22 0030 03/25/22 0151 03/25/22 1011 03/27/22 0421 03/28/22 0417 03/29/22 0722 03/30/22 0442  WBC 11.2*  --  11.9* 6.5 6.3 6.9 5.5  NEUTROABS 9.6*  --  10.0*  --   --   --   --   HGB 12.1*   < > 11.9* 9.3* 9.0* 9.1* 8.5*  HCT 34.8*   < > 35.4* 27.9* 27.5* 28.0* 26.5*  MCV 89.5  --  93.2 92.7 94.5 94.6 95.7  PLT 231  --  259 142* 150 162 170   < > = values in this interval not displayed.   Basic Metabolic Panel: Recent Labs  Lab 03/25/22 1151 03/25/22 1643 03/26/22 0816 03/27/22 0421 03/28/22 0417 03/29/22 0722 03/30/22 0442  NA 131*   < > 135 131* 131* 130* 136  K 3.1*   < > 2.8* 3.5 3.5 5.1 4.2  CL 96*   < > 104 99 100 102 106  CO2 22   < > 20* 23 24 20* 24  GLUCOSE 252*   < > 147* 385* 296* 437* 147*  BUN 44*   < > 21 24* '21 21 21  '$ CREATININE 1.21   < > 1.07 1.30* 1.13 1.12 1.19  CALCIUM 8.9   < > 8.4* 8.0* 7.8* 7.6* 8.1*  MG 1.8  --   --  1.9 2.0 1.9 1.9   < > = values in this interval not displayed.   GFR: Estimated Creatinine Clearance: 71.5 mL/min (by C-G formula based on SCr of 1.19 mg/dL). Liver Function Tests: Recent Labs  Lab 03/25/22 0030 03/25/22 1151  AST 27 19  ALT 29 22  ALKPHOS 100 71  BILITOT 0.9 0.8  PROT 8.2* 6.8  ALBUMIN 2.7* 2.3*   No results for input(s): "LIPASE", "AMYLASE" in the last 168 hours. No results for input(s): "AMMONIA" in the last 168 hours. Coagulation  Profile: No results for input(s): "INR", "PROTIME" in the last 168 hours. Cardiac Enzymes: No results for input(s): "CKTOTAL", "CKMB", "CKMBINDEX", "TROPONINI" in the last 168 hours. BNP (last 3 results) No results for input(s): "PROBNP" in the last 8760 hours. HbA1C: No results for input(s): "HGBA1C" in the last 72 hours.  CBG: Recent Labs  Lab 03/29/22 0804 03/29/22 1129 03/29/22 1641 03/29/22 2103 03/30/22 0807  GLUCAP 441* 426* 256* 162* 151*   Lipid Profile: No results for input(s): "CHOL", "HDL", "LDLCALC", "TRIG", "CHOLHDL", "LDLDIRECT" in the last 72 hours. Thyroid Function Tests: No results for input(s): "TSH", "T4TOTAL", "FREET4", "T3FREE", "THYROIDAB" in the last 72 hours. Anemia Panel: No results for input(s): "VITAMINB12", "  FOLATE", "FERRITIN", "TIBC", "IRON", "RETICCTPCT" in the last 72 hours. Sepsis Labs: Recent Labs  Lab 03/25/22 0051  LATICACIDVEN 1.6    Recent Results (from the past 240 hour(s))  Blood Cultures x 2 sites     Status: Abnormal   Collection Time: 03/25/22 12:30 AM   Specimen: BLOOD RIGHT WRIST  Result Value Ref Range Status   Specimen Description   Final    BLOOD RIGHT WRIST Performed at [facility] [address], [place], [place] [address]    Special Requests   Final    BOTTLES DRAWN AEROBIC AND ANAEROBIC Blood Culture adequate volume Performed at [facility] [address], [place], [place] [address]    Culture  Setup Time   Final    GRAM POSITIVE COCCI IN BOTH AEROBIC AND ANAEROBIC BOTTLES CRITICAL RESULT CALLED TO, READ BACK BY AND VERIFIED WITH: PHARMD CHRISTINE SHADE ON 03/25/22 @ 1625 BY DRT Performed at [facility], [place] [address] [place], [place] [address]    Culture GROUP B STREP(S.AGALACTIAE)ISOLATED (A)  Final   Report Status 03/27/2022 FINAL  Final   Organism ID, Bacteria GROUP B STREP(S.AGALACTIAE)ISOLATED  Final      Susceptibility   Group b  strep(s.agalactiae)isolated - MIC*    CLINDAMYCIN >=1 RESISTANT Resistant     AMPICILLIN <=0.25 SENSITIVE Sensitive     ERYTHROMYCIN >=8 RESISTANT Resistant     VANCOMYCIN 0.5 SENSITIVE Sensitive     CEFTRIAXONE <=0.12 SENSITIVE Sensitive     LEVOFLOXACIN 0.5 SENSITIVE Sensitive     * GROUP B STREP(S.AGALACTIAE)ISOLATED  Blood Culture ID Panel (Reflexed)     Status: Abnormal   Collection Time: 03/25/22 12:30 AM  Result Value Ref Range Status   Enterococcus faecalis NOT DETECTED NOT DETECTED Final   Enterococcus Faecium NOT DETECTED NOT DETECTED Final   Listeria monocytogenes NOT DETECTED NOT DETECTED Final   Staphylococcus species NOT DETECTED NOT DETECTED Final   Staphylococcus aureus (BCID) NOT DETECTED NOT DETECTED Final   Staphylococcus epidermidis NOT DETECTED NOT DETECTED Final   Staphylococcus lugdunensis NOT DETECTED NOT DETECTED Final   Streptococcus species DETECTED (A) NOT DETECTED Final    Comment: CRITICAL RESULT CALLED TO, READ BACK BY AND VERIFIED WITH: PHARMD CHRISTINE SHADE ON 03/25/22 @ 1625 BY DRT    Streptococcus agalactiae DETECTED (A) NOT DETECTED Final    Comment: CRITICAL RESULT CALLED TO, READ BACK BY AND VERIFIED WITH: PHARMD CHRISTINE SHADE ON 03/25/22 @ 1625 BY DRT    Streptococcus pneumoniae NOT DETECTED NOT DETECTED Final   Streptococcus pyogenes NOT DETECTED NOT DETECTED Final   A.calcoaceticus-baumannii NOT DETECTED NOT DETECTED Final   Bacteroides fragilis NOT DETECTED NOT DETECTED Final   Enterobacterales NOT DETECTED NOT DETECTED Final   Enterobacter cloacae complex NOT DETECTED NOT DETECTED Final   Escherichia coli NOT DETECTED NOT DETECTED Final   Klebsiella aerogenes NOT DETECTED NOT DETECTED Final   Klebsiella oxytoca NOT DETECTED NOT DETECTED Final   Klebsiella pneumoniae NOT DETECTED NOT DETECTED Final   Proteus species NOT DETECTED NOT DETECTED Final   Salmonella species NOT DETECTED NOT DETECTED Final   Serratia marcescens NOT DETECTED NOT  DETECTED Final   Haemophilus influenzae NOT DETECTED NOT DETECTED Final   Neisseria meningitidis NOT DETECTED NOT DETECTED Final   Pseudomonas aeruginosa NOT DETECTED NOT DETECTED Final   Stenotrophomonas maltophilia NOT DETECTED NOT DETECTED Final   Candida albicans NOT DETECTED NOT DETECTED Final   Candida auris NOT DETECTED NOT DETECTED Final  Candida glabrata NOT DETECTED NOT DETECTED Final   Candida krusei NOT DETECTED NOT DETECTED Final   Candida parapsilosis NOT DETECTED NOT DETECTED Final   Candida tropicalis NOT DETECTED NOT DETECTED Final   Cryptococcus neoformans/gattii NOT DETECTED NOT DETECTED Final    Comment: Performed at [facility], [place] [address] [place], [place] [address]  Blood Cultures x 2 sites     Status: Abnormal   Collection Time: 03/25/22 12:41 AM   Specimen: BLOOD LEFT HAND  Result Value Ref Range Status   Specimen Description   Final    BLOOD LEFT HAND Performed at [facility] [address] [place], [place] [address]    Special Requests   Final    BOTTLES DRAWN AEROBIC ONLY Blood Culture results may not be optimal due to an inadequate volume of blood received in culture bottles Performed at [facility] [address], [place], [place] [address]    Culture (A)  Final    GROUP B STREP(S.AGALACTIAE)ISOLATED SUSCEPTIBILITIES PERFORMED ON PREVIOUS CULTURE WITHIN THE LAST 5 DAYS. Performed at [facility], [place] [address], [place], [place] [address]    Report Status 03/27/2022 FINAL  Final  MRSA Next Gen by PCR, Nasal     Status: Abnormal   Collection Time: 03/25/22  5:48 AM   Specimen: Nasal Mucosa; Nasal Swab  Result Value Ref Range Status   MRSA by PCR Next Gen DETECTED (A) NOT DETECTED Final    Comment: (NOTE) The GeneXpert MRSA Assay (FDA approved for NASAL specimens only), is one component of a comprehensive MRSA colonization surveillance program. It is not intended to diagnose MRSA infection  nor to guide or monitor treatment for MRSA infections. Test performance is not FDA approved in patients less than 44 years old. Performed at [facility], [place] [address] [place], [place] [address]   Aerobic/Anaerobic Culture w Gram Stain (surgical/deep wound)     Status: Abnormal (Preliminary result)   Collection Time: 03/25/22  7:28 PM   Specimen: Wound; Abscess  Result Value Ref Range Status   Specimen Description   Final    ABSCESS FOOT LEFT Performed at [facility] [address], [place], [place] [address]    Special Requests   Final    NONE Performed at [facility], [place] [address], [place], [place] [address]    Gram Stain   Final    NO SQUAMOUS EPITHELIAL CELLS SEEN FEW WBC SEEN FEW GRAM POSITIVE RODS FEW GRAM NEGATIVE RODS FEW GRAM POSITIVE COCCI Performed at [facility], [place] [address] [place], [place] [address]    Culture (A)  Final    MULTIPLE ORGANISMS PRESENT, NONE PREDOMINANT NO ANAEROBES ISOLATED; CULTURE IN PROGRESS FOR 5 DAYS    Report Status PENDING  Incomplete  Culture, blood (Routine X 2) w Reflex to ID Panel     Status: None (Preliminary result)   Collection Time: 03/27/22  8:47 AM   Specimen: BLOOD LEFT WRIST  Result Value Ref Range Status   Specimen Description   Final    BLOOD LEFT WRIST Performed at [facility] [address], [place], [place] [address]    Special Requests   Final    BOTTLES DRAWN AEROBIC AND ANAEROBIC Blood Culture results may not be optimal due to an inadequate volume of blood received in culture bottles Performed at [facility] [address] [place], [place] [address]  Culture   Final    NO GROWTH 2 DAYS Performed at [facility], [place] [address], [place], [place] [address]    Report Status PENDING  Incomplete  Culture, blood (Routine X 2) w Reflex to ID Panel     Status: None (Preliminary result)    Collection Time: 03/27/22  8:47 AM   Specimen: BLOOD  Result Value Ref Range Status   Specimen Description BLOOD BLOOD LEFT WRIST  Final   Special Requests   Final    BOTTLES DRAWN AEROBIC AND ANAEROBIC Blood Culture adequate volume   Culture   Final    NO GROWTH 2 DAYS Performed at [facility], [place] [address] [place], [place] [address]    Report Status PENDING  Incomplete         Radiology Studies: No results found.      Scheduled Meds:  atenolol  100 mg Oral Daily   atorvastatin  80 mg Oral Daily   B-complex with vitamin C  1 tablet Oral Daily   famotidine  20 mg Oral BID   fentaNYL  1 patch Transdermal Q72H   finasteride  5 mg Oral Daily   glipiZIDE  5 mg Oral QAC breakfast   insulin aspart  0-20 Units Subcutaneous TID WC   insulin aspart  0-5 Units Subcutaneous QHS   insulin aspart  6 Units Subcutaneous TID WC   insulin glargine-yfgn  40 Units Subcutaneous Daily   mupirocin ointment   Nasal BID   nutrition supplement (JUVEN)  1 packet Oral BID BM   Continuous Infusions:  penicillin G potassium 12 Million Units in dextrose 5 % 500 mL continuous infusion 41.7 mL/hr at 03/30/22 0446   sodium chloride Stopped (03/25/22 2309)     LOS: 5 days   Time spent= 35 mins    Makai Dumond Arsenio Loader, MD [facility]  If 7PM-7AM, please contact night-coverage  03/30/2022, 8:49 AM

## 2022-03-31 ENCOUNTER — Ambulatory Visit: Payer: Self-pay | Admitting: Podiatry

## 2022-03-31 LAB — GLUCOSE, CAPILLARY
Glucose-Capillary: 121 mg/dL — ABNORMAL HIGH (ref 70–99)
Glucose-Capillary: 92 mg/dL (ref 70–99)
Glucose-Capillary: 93 mg/dL (ref 70–99)
Glucose-Capillary: 178 mg/dL — ABNORMAL HIGH (ref 70–99)

## 2022-03-31 LAB — BASIC METABOLIC PANEL
Anion gap: 7 (ref 5–15)
BUN: 22 mg/dL (ref 8–23)
CO2: 26 mmol/L (ref 22–32)
Calcium: 8.6 mg/dL — ABNORMAL LOW (ref 8.9–10.3)
Chloride: 103 mmol/L (ref 98–111)
Creatinine, Ser: 1.03 mg/dL (ref 0.61–1.24)
GFR, Estimated: >60 mL/min (ref >60)
Glucose, Bld: 151 mg/dL — ABNORMAL HIGH (ref 70–99)
Potassium: 4.9 mmol/L (ref 3.5–5.1)
Sodium: 136 mmol/L (ref 135–145)

## 2022-03-31 LAB — CBC
HCT: 29.1 % — ABNORMAL LOW (ref 39.0–52.0)
Hemoglobin: 9.4 g/dL — ABNORMAL LOW (ref 13.0–17.0)
MCH: 30.8 pg (ref 26.0–34.0)
MCHC: 32.3 g/dL (ref 30.0–36.0)
MCV: 95.4 fL (ref 80.0–100.0)
Platelets: 191 10*3/uL (ref 150–400)
RBC: 3.05 MIL/uL — ABNORMAL LOW (ref 4.22–5.81)
RDW: 12.4 % (ref 11.5–15.5)
WBC: 5.8 10*3/uL (ref 4.0–10.5)
nRBC: 0 % (ref 0.0–0.2)

## 2022-03-31 LAB — MAGNESIUM: Magnesium: 1.7 mg/dL (ref 1.7–2.4)

## 2022-03-31 MED ORDER — CALCIUM CARBONATE ANTACID 500 MG PO CHEW
1.0000 | CHEWABLE_TABLET | Freq: Three times a day (TID) | ORAL | Status: DC | PRN
Start: 2022-03-31 — End: 2022-04-04
  Administered 2022-03-31: 200 mg via ORAL
  Filled 2022-03-31: qty 1

## 2022-03-31 NOTE — Progress Notes (Signed)
PROGRESS NOTE    Robert Burnett  OIZ:[id number] DOB: 29-Nov-1960 DOA: 03/24/2022 PCP: [name], MD   Brief Narrative:  61 y.o. male with medical history significant of seasonal allergies, unspecified anemia, DDD, asthma, cigarette smoker, type II DM, diabetic peripheral neuropathy, nonalcoholic fatty liver disease, GERD, hyperlipidemia, hypertension, history of pneumonia x3, urothelial cancer who is coming to the emergency department due to worsening of left foot wound for the past 2 weeks, but particularly a lot worse in the last 2 days that it has changing color and been draining a foul-smelling discharge after he kept the wrap for a week.  Upon admission patient was noted to be hyperglycemic with evidence of gangrene left foot, necrotizing infection.  Podiatry was consulted and he underwent transmetatarsal left amputation with I&D on 6/6.  Eventually his blood cultures grew group B strep bacteremia, ID was consulted and patient was started on IV penicillin.  Patient underwent repeat amputation by podiatry on 6/9.  Assessment & Plan:  Principal Problem:   Gangrene of left foot (HCC) Active Problems:   Diabetic peripheral neuropathy associated with type 2 diabetes mellitus (HCC)   Thoracic spondylosis with myelopathy   Type 2 diabetes mellitus with left diabetic foot infection (HCC)   Hypertension   Fatty liver disease, nonalcoholic   Pyogenic inflammation of bone (HCC)   Necrotizing soft tissue infection   Hypokalemia   Protein-calorie malnutrition, severe (HCC)   Abscess of tendon sheath, left ankle and foot     Severe left foot diabetic infection with wet gangrene and necrotizing infection with osteomyelitis Abscess noted in the area as well - Status post transmetatarsal amputation of the left foot with I&D by podiatry on 6/6.  Status post OR again by podiatry on 6/9 with Lisfranc amputation.  According to podiatry they were able to eradicate remaining infection with clean margins.   We will plan for total of minimum 2 weeks of antibiotics but will have ID weigh in on this as well. - Podiatry removed his drain on 6/11. -Pain control, ID consulted.  - On IV penicillin. -[place] ABI - normal. No further vascular eval for now, can follow up outptn .  Group B strep bacteremia - Currently on IV penicillin repeat cultures-no growth to date.  No need for TEE per ID - Echocardiogram-normal without evidence of vegetations - ID following  Diabetes mellitus type 2 with severe hyperglycemia; improved - On home patient is on metformin 500 mg 3 times daily.  Hemoglobin A1c is 14.2.  Increase Semglee to 40 units daily, sliding scale and Accu-Chek.  NovoLog 3 units premeals.  Glipizide 5 mg daily  Thoracic spondylosis with myelopathy - Pain control  Essential hypertension - Atenolol  Nonalcoholic fatty liver disease - Monitor LFTs  Hypokalemia - Replete as necessary  Severe protein calorie malnutrition - Nutrition evaluation  DVT prophylaxis: SCDs Start: 03/25/22 0411  Code Status: Full  Family Communication: Family has periodically been updated  Status is: Inpatient Remains inpatient appropriate because: Patient not doing better, approaching discharge over next 24-48 hours once cleared by infectious disease.  In the meantime PT has recommended SNF therefore [facility] team consulted.  Subjective: Patient's drain was removed yesterday which she tolerated well, does not have any complaints this morning.  Blood sugar appears to be better controlled.  Examination: Constitutional: Not in acute distress Respiratory: Clear to auscultation bilaterally Cardiovascular: Normal sinus rhythm, no rubs Abdomen: Nontender nondistended good bowel sounds Musculoskeletal: No edema noted Skin: No rashes seen Neurologic: CN  2-12 grossly intact.  And nonfocal Psychiatric: Normal judgment and insight. Alert and oriented x 3. Normal mood. Surgical site of the left foot noted, and dressing in  place  Objective: Vitals:   03/31/22 0323 03/31/22 0327 03/31/22 0924 03/31/22 0927  BP: 118/74  (!) 120/59 123/78  Pulse: (!) 58  68   Resp: 20     Temp: 98.6 F (37 C)     TempSrc: Oral     SpO2: 100%  99%   Weight:  87.9 kg    Height:        Intake/Output Summary (Last 24 hours) at 03/31/2022 1219 Last data filed at 03/31/2022 1209 Gross per 24 hour  Intake 2508.7 ml  Output 4500 ml  Net -1991.3 ml   Filed Weights   03/28/22 1713 03/29/22 0500 03/31/22 0327  Weight: 83.9 kg 86.5 kg 87.9 kg     Data Reviewed:   CBC: Recent Labs  Lab 03/25/22 1011 03/27/22 0421 03/28/22 0417 03/29/22 0722 03/30/22 0442 03/31/22 0352  WBC 11.9* 6.5 6.3 6.9 5.5 5.8  NEUTROABS 10.0*  --   --   --   --   --   HGB 11.9* 9.3* 9.0* 9.1* 8.5* 9.4*  HCT 35.4* 27.9* 27.5* 28.0* 26.5* 29.1*  MCV 93.2 92.7 94.5 94.6 95.7 95.4  PLT 259 142* 150 162 170 096   Basic Metabolic Panel: Recent Labs  Lab 03/27/22 0421 03/28/22 0417 03/29/22 0722 03/30/22 0442 03/31/22 0352  NA 131* 131* 130* 136 136  K 3.5 3.5 5.1 4.2 4.9  CL 99 100 102 106 103  CO2 23 24 20* 24 26  GLUCOSE 385* 296* 437* 147* 151*  BUN 24* '21 21 21 22  '$ CREATININE 1.30* 1.13 1.12 1.19 1.03  CALCIUM 8.0* 7.8* 7.6* 8.1* 8.6*  MG 1.9 2.0 1.9 1.9 1.7   GFR: Estimated Creatinine Clearance: 82.7 mL/min (by C-G formula based on SCr of 1.03 mg/dL). Liver Function Tests: Recent Labs  Lab 03/25/22 0030 03/25/22 1151  AST 27 19  ALT 29 22  ALKPHOS 100 71  BILITOT 0.9 0.8  PROT 8.2* 6.8  ALBUMIN 2.7* 2.3*   No results for input(s): "LIPASE", "AMYLASE" in the last 168 hours. No results for input(s): "AMMONIA" in the last 168 hours. Coagulation Profile: No results for input(s): "INR", "PROTIME" in the last 168 hours. Cardiac Enzymes: No results for input(s): "CKTOTAL", "CKMB", "CKMBINDEX", "TROPONINI" in the last 168 hours. BNP (last 3 results) No results for input(s): "PROBNP" in the last 8760 hours. HbA1C: No  results for input(s): "HGBA1C" in the last 72 hours.  CBG: Recent Labs  Lab 03/30/22 1159 03/30/22 1614 03/30/22 2123 03/31/22 0727 03/31/22 1136  GLUCAP 218* 88 131* 121* 92   Lipid Profile: No results for input(s): "CHOL", "HDL", "LDLCALC", "TRIG", "CHOLHDL", "LDLDIRECT" in the last 72 hours. Thyroid Function Tests: No results for input(s): "TSH", "T4TOTAL", "FREET4", "T3FREE", "THYROIDAB" in the last 72 hours. Anemia Panel: No results for input(s): "VITAMINB12", "FOLATE", "FERRITIN", "TIBC", "IRON", "RETICCTPCT" in the last 72 hours. Sepsis Labs: Recent Labs  Lab 03/25/22 0051  LATICACIDVEN 1.6    Recent Results (from the past 240 hour(s))  Blood Cultures x 2 sites     Status: Abnormal   Collection Time: 03/25/22 12:30 AM   Specimen: BLOOD RIGHT WRIST  Result Value Ref Range Status   Specimen Description   Final    BLOOD RIGHT WRIST Performed at [facility] [address], [place], [place] [address]  Special Requests   Final    BOTTLES DRAWN AEROBIC AND ANAEROBIC Blood Culture adequate volume Performed at [facility] [address], [place], [place] [address]    Culture  Setup Time   Final    GRAM POSITIVE COCCI IN BOTH AEROBIC AND ANAEROBIC BOTTLES CRITICAL RESULT CALLED TO, READ BACK BY AND VERIFIED WITH: PHARMD CHRISTINE SHADE ON 03/25/22 @ 1625 BY DRT Performed at [facility], [place] [address], [place], [place] [address]    Culture GROUP B STREP(S.AGALACTIAE)ISOLATED (A)  Final   Report Status 03/27/2022 FINAL  Final   Organism ID, Bacteria GROUP B STREP(S.AGALACTIAE)ISOLATED  Final      Susceptibility   Group b strep(s.agalactiae)isolated - MIC*    CLINDAMYCIN >=1 RESISTANT Resistant     AMPICILLIN <=0.25 SENSITIVE Sensitive     ERYTHROMYCIN >=8 RESISTANT Resistant     VANCOMYCIN 0.5 SENSITIVE Sensitive     CEFTRIAXONE <=0.12 SENSITIVE Sensitive     LEVOFLOXACIN 0.5 SENSITIVE Sensitive     * GROUP B  STREP(S.AGALACTIAE)ISOLATED  Blood Culture ID Panel (Reflexed)     Status: Abnormal   Collection Time: 03/25/22 12:30 AM  Result Value Ref Range Status   Enterococcus faecalis NOT DETECTED NOT DETECTED Final   Enterococcus Faecium NOT DETECTED NOT DETECTED Final   Listeria monocytogenes NOT DETECTED NOT DETECTED Final   Staphylococcus species NOT DETECTED NOT DETECTED Final   Staphylococcus aureus (BCID) NOT DETECTED NOT DETECTED Final   Staphylococcus epidermidis NOT DETECTED NOT DETECTED Final   Staphylococcus lugdunensis NOT DETECTED NOT DETECTED Final   Streptococcus species DETECTED (A) NOT DETECTED Final    Comment: CRITICAL RESULT CALLED TO, READ BACK BY AND VERIFIED WITH: PHARMD CHRISTINE SHADE ON 03/25/22 @ 1625 BY DRT    Streptococcus agalactiae DETECTED (A) NOT DETECTED Final    Comment: CRITICAL RESULT CALLED TO, READ BACK BY AND VERIFIED WITH: PHARMD CHRISTINE SHADE ON 03/25/22 @ 1625 BY DRT    Streptococcus pneumoniae NOT DETECTED NOT DETECTED Final   Streptococcus pyogenes NOT DETECTED NOT DETECTED Final   A.calcoaceticus-baumannii NOT DETECTED NOT DETECTED Final   Bacteroides fragilis NOT DETECTED NOT DETECTED Final   Enterobacterales NOT DETECTED NOT DETECTED Final   Enterobacter cloacae complex NOT DETECTED NOT DETECTED Final   Escherichia coli NOT DETECTED NOT DETECTED Final   Klebsiella aerogenes NOT DETECTED NOT DETECTED Final   Klebsiella oxytoca NOT DETECTED NOT DETECTED Final   Klebsiella pneumoniae NOT DETECTED NOT DETECTED Final   Proteus species NOT DETECTED NOT DETECTED Final   Salmonella species NOT DETECTED NOT DETECTED Final   Serratia marcescens NOT DETECTED NOT DETECTED Final   Haemophilus influenzae NOT DETECTED NOT DETECTED Final   Neisseria meningitidis NOT DETECTED NOT DETECTED Final   Pseudomonas aeruginosa NOT DETECTED NOT DETECTED Final   Stenotrophomonas maltophilia NOT DETECTED NOT DETECTED Final   Candida albicans NOT DETECTED NOT DETECTED  Final   Candida auris NOT DETECTED NOT DETECTED Final   Candida glabrata NOT DETECTED NOT DETECTED Final   Candida krusei NOT DETECTED NOT DETECTED Final   Candida parapsilosis NOT DETECTED NOT DETECTED Final   Candida tropicalis NOT DETECTED NOT DETECTED Final   Cryptococcus neoformans/gattii NOT DETECTED NOT DETECTED Final    Comment: Performed at [facility], [place] [address], [place], [place] [address]  Blood Cultures x 2 sites     Status: Abnormal   Collection Time: 03/25/22 12:41 AM   Specimen: BLOOD LEFT HAND  Result Value Ref Range Status  Specimen Description   Final    BLOOD LEFT HAND Performed at [facility] [address], [place], [place] [address]    Special Requests   Final    BOTTLES DRAWN AEROBIC ONLY Blood Culture results may not be optimal due to an inadequate volume of blood received in culture bottles Performed at [facility] [address] [place], [place] [address]    Culture (A)  Final    GROUP B STREP(S.AGALACTIAE)ISOLATED SUSCEPTIBILITIES PERFORMED ON PREVIOUS CULTURE WITHIN THE LAST 5 DAYS. Performed at [facility], [place] [address], [place], [place] [address]    Report Status 03/27/2022 FINAL  Final  MRSA Next Gen by PCR, Nasal     Status: Abnormal   Collection Time: 03/25/22  5:48 AM   Specimen: Nasal Mucosa; Nasal Swab  Result Value Ref Range Status   MRSA by PCR Next Gen DETECTED (A) NOT DETECTED Final    Comment: (NOTE) The GeneXpert MRSA Assay (FDA approved for NASAL specimens only), is one component of a comprehensive MRSA colonization surveillance program. It is not intended to diagnose MRSA infection nor to guide or monitor treatment for MRSA infections. Test performance is not FDA approved in patients less than 3 years old. Performed at [facility], [place] [address] [place], [place] [address]   Aerobic/Anaerobic Culture w Gram Stain (surgical/deep wound)     Status:  None (Preliminary result)   Collection Time: 03/25/22  7:28 PM   Specimen: Wound; Abscess  Result Value Ref Range Status   Specimen Description   Final    ABSCESS FOOT LEFT Performed at [facility] [address] [place], [place] [address]    Special Requests   Final    NONE Performed at [facility], [place] [address] [place], [place] [address]    Gram Stain   Final    NO SQUAMOUS EPITHELIAL CELLS SEEN FEW WBC SEEN FEW GRAM POSITIVE RODS FEW GRAM NEGATIVE RODS FEW GRAM POSITIVE COCCI    Culture   Final    CULTURE REINCUBATED FOR BETTER GROWTH NO ANAEROBES ISOLATED Performed at [facility], [place] [address], [place], [place] [address]    Report Status PENDING  Incomplete  Culture, blood (Routine X 2) w Reflex to ID Panel     Status: None (Preliminary result)   Collection Time: 03/27/22  8:47 AM   Specimen: BLOOD LEFT WRIST  Result Value Ref Range Status   Specimen Description   Final    BLOOD LEFT WRIST Performed at [facility] [address] [place], [place] [address]    Special Requests   Final    BOTTLES DRAWN AEROBIC AND ANAEROBIC Blood Culture results may not be optimal due to an inadequate volume of blood received in culture bottles Performed at [facility] [address], [place], [place] [address]    Culture   Final    NO GROWTH 4 DAYS Performed at [facility], [place] [address], [place], [place] [address]    Report Status PENDING  Incomplete  Culture, blood (Routine X 2) w Reflex to ID Panel     Status: None (Preliminary result)   Collection Time: 03/27/22  8:47 AM   Specimen: BLOOD  Result Value Ref Range Status   Specimen Description BLOOD BLOOD LEFT WRIST  Final   Special Requests   Final    BOTTLES DRAWN AEROBIC AND ANAEROBIC Blood Culture adequate volume  Culture   Final    NO GROWTH 4 DAYS Performed at [facility], [place] [address], [place], [place]  [address]    Report Status PENDING  Incomplete         Radiology Studies: No results found.      Scheduled Meds:  atenolol  100 mg Oral Daily   atorvastatin  80 mg Oral Daily   B-complex with vitamin C  1 tablet Oral Daily   famotidine  20 mg Oral BID   fentaNYL  1 patch Transdermal Q72H   finasteride  5 mg Oral Daily   glipiZIDE  5 mg Oral QAC breakfast   insulin aspart  0-20 Units Subcutaneous TID WC   insulin aspart  0-5 Units Subcutaneous QHS   insulin aspart  3 Units Subcutaneous TID WC   insulin glargine-yfgn  40 Units Subcutaneous Daily   mupirocin ointment   Nasal BID   nutrition supplement (JUVEN)  1 packet Oral BID BM   Continuous Infusions:  penicillin G potassium 12 Million Units in dextrose 5 % 500 mL continuous infusion 41.7 mL/hr at 03/31/22 1209   sodium chloride Stopped (03/25/22 2309)     LOS: 6 days   Time spent= 35 mins    Marquies Wanat Arsenio Loader, MD [facility]  If 7PM-7AM, please contact night-coverage  03/31/2022, 12:19 PM

## 2022-03-31 NOTE — Progress Notes (Signed)
  Subjective:  Patient ID: Robert Burnett, male    DOB: Mar 10, 1961,  MRN: [id number]  POD #2 status post Lisfranc amputation, TAL, secondary wound closure  Feeling okay.  His pain is improving  Negative for chest pain and shortness of breath Fever: no Constitutional signs: no Review of all other systems is negative Objective:   Vitals:   03/30/22 2123 03/31/22 0323  BP: 125/84 118/74  Pulse: 61 (!) 58  Resp: 18 20  Temp: 98.1 F (36.7 C) 98.6 F (37 C)  SpO2: 100% 100%   General AA&O x3. Normal mood and affect.  Vascular Dorsalis pedis and posterior tibial pulses 2/4 bilat. Brisk capillary refill to all digits. Pedal hair present.  Neurologic Epicritic sensation grossly intact.  Dermatologic Splint is clean dry and intact.  JP drain with bloody drainage, approximately 20 cc in bulb  Orthopedic: MMT 5/5 in dorsiflexion, plantarflexion, inversion, and eversion. Normal joint ROM without pain or crepitus.               Assessment & Plan:  Patient was evaluated and treated and all questions answered.  Severe diabetic foot infection left foot -Overall doing well.  Dressing was changed today and the splint was removed.  The skin flaps appear to be well perfused.  JP drain removed uneventfully -Expect he will be able to be discharged on 2 weeks of p.o. antibiotics, culture so far not growing any predominant organism may need to send on broad-spectrum. -We again discussed his postop recovery process.  He will need to be completely nonweightbearing on the left foot.  He lives by himself and I do not think he likely will be able to safely take care of himself at home.  I would recommend discharge to SNF or SAR if possible -Continue NWB on LLE in short leg splint -No further dressing changes needed until he sees me in the office -My office will schedule his follow-up  [name], DPM  Accessible via secure chat for questions or concerns.

## 2022-03-31 NOTE — NC FL2 (Signed)
[place] LEVEL OF CARE SCREENING TOOL     IDENTIFICATION  Patient Name: Robert Burnett Birthdate: May 15, 1961 Sex: male Admission Date (Current Location): 03/24/2022  [facility] and [place] Number:  Herbalist and Address:  [facility],  [place], [place]      Provider Number: [id number]  Attending Physician Name and Address:  Damita Lack, MD  Relative Name and Phone Number:  May,[place] Relative [phone] Nephew [phone],   [phone] cell    Current Level of Care: Hospital Recommended Level of Care: [place] Prior Approval Number:    Date Approved/Denied:   PASRR Number: [id number]  Discharge Plan: SNF    Current Diagnoses: Patient Active Problem List   Diagnosis Date Noted   Gangrene of left foot ([place]) 03/25/2022   Hypokalemia 03/25/2022   Protein-calorie malnutrition, severe ([place]) 03/25/2022   Pyogenic inflammation of bone ([place])    Necrotizing soft tissue infection    Abscess of tendon sheath, left ankle and foot    Lateral epicondylitis of left elbow 08/01/2021   Ureteral cancer, 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   Proteinuria 09/19/2018   Hematuria 09/19/2018   Anterior cervical adenopathy 09/19/2018   Fatty liver disease, nonalcoholic 04/12/7627   Hypertension 06/27/2016   Thoracic spondylosis with myelopathy 03/16/2013   Carpal tunnel syndrome 03/16/2013   Chest wall pain, chronic 03/16/2013   Type 2 diabetes mellitus with left diabetic foot infection ([place]) 03/16/2013   Debility 03/16/2013   Chronic pain syndrome 03/16/2013   Lumbar degenerative disc disease 12/12/2011   Costochondritis 12/12/2011   Diabetic peripheral neuropathy associated with type 2 diabetes mellitus ([place]) 12/12/2011    Orientation RESPIRATION BLADDER Height & Weight     Self, Time, Situation, Place  Normal Continent Weight: 87.9 kg Height:  6' (182.9 cm)  BEHAVIORAL SYMPTOMS/MOOD NEUROLOGICAL BOWEL  NUTRITION STATUS      Continent Diet (Carb Modified)  AMBULATORY STATUS COMMUNICATION OF NEEDS Skin   Extensive Assist Verbally Surgical wounds (amputation of left partical foot)                       Personal Care Assistance Level of Assistance  Bathing, Feeding, Dressing Bathing Assistance: Limited assistance Feeding assistance: Limited assistance Dressing Assistance: Limited assistance     Functional Limitations Info  Sight, Hearing, Speech Sight Info: Adequate Hearing Info: Adequate Speech Info: Adequate    SPECIAL CARE FACTORS FREQUENCY  PT (By licensed PT), OT (By licensed OT)     PT Frequency: x5 week OT Frequency: x5 week            Contractures Contractures Info: Not present    Additional Factors Info  Code Status, Allergies Code Status Info: FULL Allergies Info: Prozac (Fluoxetine Hcl), Amlodipine           Current Medications (03/31/2022):  This is the current hospital active medication list Current Facility-Administered Medications  Medication Dose Route Frequency Provider Last Rate Last Admin   acetaminophen (TYLENOL) tablet 650 mg  650 mg Oral Q6H PRN [name], DPM       Or   acetaminophen (TYLENOL) suppository 650 mg  650 mg Rectal Q6H PRN [name], DPM       atenolol (TENORMIN) tablet 100 mg  100 mg Oral Daily Lanae Crumbly R, DPM   100 mg at 03/31/22 0926   atorvastatin (LIPITOR) tablet 80 mg  80 mg Oral Daily McDonald, Adam R, DPM   80 mg  at 03/31/22 0926   B-complex with vitamin C tablet 1 tablet  1 tablet Oral Daily [name], DPM   1 tablet at 03/31/22 2025   calcium carbonate (TUMS - dosed in mg elemental calcium) chewable tablet 200 mg of elemental calcium  1 tablet Oral TID PRN Amin, Ankit Chirag, MD   200 mg of elemental calcium at 03/31/22 1041   dextrose 50 % solution 0-50 mL  0-50 mL Intravenous PRN [name], DPM       famotidine (PEPCID) tablet 20 mg  20 mg Oral BID Amin, Ankit Chirag, MD   20 mg at  03/31/22 0926   fentaNYL (DURAGESIC) 75 MCG/HR 1 patch  1 patch Transdermal Q72H [name], DPM   1 patch at 03/30/22 0853   finasteride (PROSCAR) tablet 5 mg  5 mg Oral Daily McDonald, Adam R, DPM   5 mg at 03/31/22 0926   glipiZIDE (GLUCOTROL) tablet 5 mg  5 mg Oral QAC breakfast Amin, Ankit Chirag, MD   5 mg at 03/31/22 0730   guaiFENesin (MUCINEX) 12 hr tablet 1,200 mg  1,200 mg Oral BID PRN [name], DPM       hydrALAZINE (APRESOLINE) injection 10 mg  10 mg Intravenous Q4H PRN McDonald, Stephan Minister, DPM       HYDROmorphone (DILAUDID) injection 1 mg  1 mg Intravenous Q4H PRN [name], DPM   1 mg at 03/31/22 1147   insulin aspart (novoLOG) injection 0-20 Units  0-20 Units Subcutaneous TID WC Amin, Ankit Chirag, MD   3 Units at 03/31/22 0731   insulin aspart (novoLOG) injection 0-5 Units  0-5 Units Subcutaneous QHS Amin, Ankit Chirag, MD       insulin aspart (novoLOG) injection 3 Units  3 Units Subcutaneous TID WC Amin, Ankit Chirag, MD   3 Units at 03/31/22 1303   insulin glargine-yfgn (SEMGLEE) injection 40 Units  40 Units Subcutaneous Daily Amin, Ankit Chirag, MD   40 Units at 03/31/22 0926   ipratropium-albuterol (DUONEB) 0.5-2.5 (3) MG/3ML nebulizer solution 3 mL  3 mL Nebulization Q4H PRN [name], DPM       melatonin tablet 5 mg  5 mg Oral QHS PRN [name], DPM   5 mg at 03/29/22 2121   metoprolol tartrate (LOPRESSOR) injection 5 mg  5 mg Intravenous Q4H PRN [name], DPM   5 mg at 03/26/22 1454   mupirocin ointment (BACTROBAN) 2 %   Nasal BID [name], DPM   Given at 03/31/22 0926   naloxone (NARCAN) injection 0.4 mg  0.4 mg Intravenous PRN [name], DPM       nutrition supplement (JUVEN) (JUVEN) powder packet 1 packet  1 packet Oral BID BM [name], DPM   1 packet at 03/31/22 0926   oxyCODONE (Oxy IR/ROXICODONE) immediate release tablet 5 mg  5 mg Oral Q4H PRN [name], DPM   5 mg at 03/31/22 1507   penicillin G  potassium 12 Million Units in dextrose 5 % 500 mL continuous infusion  12 Million Units Intravenous Q12H [name], DPM 41.7 mL/hr at 03/31/22 1209 Infusion Verify at 03/31/22 1209   prochlorperazine (COMPAZINE) injection 10 mg  10 mg Intravenous Q6H PRN [name], DPM       senna-docusate (Senokot-S) tablet 1 tablet  1 tablet Oral QHS PRN [name], DPM   1 tablet at 03/27/22 1355     Discharge  Medications: Please see discharge summary for a list of discharge medications.  Relevant Imaging Results:  Relevant Lab Results:   Additional Information SS#[id number]  Purcell Mouton, RN

## 2022-03-31 NOTE — Progress Notes (Signed)
Physical Therapy Treatment Patient Details Name: Robert Burnett MRN: [id number] DOB: 03/17/1961 Today's Date: 03/31/2022   History of Present Illness 61 y.o. male admitted through emergency department due to worsening of left foot wound, evidence of gangrene left foot, necrotizing infection  Podiatry was consulted and he underwent transmetatarsal left amputation with I&D on 6/6.    PMH: seasonal allergies, unspecified anemia, DDD, asthma, cigarette smoker, type II DM, diabetic peripheral neuropathy, nonalcoholic fatty liver disease, GERD, hyperlipidemia, hypertension, history of pneumonia x3, urothelial cancer .    PT Comments    Pt with incr activity tolerance this session. Able to amb short distances, does fatigue but demonstrates improved compliance with NWB LLE.  Continue PT POC  Recommendations for follow up therapy are one component of a multi-disciplinary discharge planning process, led by the attending physician.  Recommendations may be updated based on patient status, additional functional criteria and insurance authorization.  Follow Up Recommendations  Home health PT (if avail, needs SNF - no ins)     Assistance Recommended at Discharge Intermittent Supervision/Assistance  Patient can return home with the following Help with stairs or ramp for entrance;Assist for transportation;Assistance with cooking/housework   Equipment Recommendations  None recommended by PT    Recommendations for Other Services       Precautions / Restrictions Precautions Precautions: Fall Required Braces or Orthoses: Splint/Cast Splint/Cast: L ankle splint Restrictions LLE Weight Bearing: Non weight bearing     Mobility  Bed Mobility               General bed mobility comments: in recliner    Transfers Overall transfer level: Needs assistance Equipment used: Rolling walker (2 wheels) Transfers: Sit to/from Stand Sit to Stand: Min assist           General transfer comment:  cues for hand placement, NWB LLE/position. assist with anterior-superior wt shift and to steady on rising and transition to RW.    Ambulation/Gait Ambulation/Gait assistance: Min assist Gait Distance (Feet): 20 Feet (x2) Assistive device: Rolling walker (2 wheels)         General Gait Details: cues for RW position, and to push through UEs. good adherence to NWB on LLE   Stairs             Wheelchair Mobility    Modified Rankin (Stroke Patients Only)       Balance           Standing balance support: During functional activity, Single extremity supported Standing balance-[name]: Poor Standing balance comment: reliant on at least unilateral  UE for static, bil UEs for dynamic tasks                            Cognition Arousal/Alertness: Awake/alert Behavior During Therapy: WFL for tasks assessed/performed Overall Cognitive Status: Within Functional Limits for tasks assessed                                          Exercises      General Comments        Pertinent Vitals/Pain Pain Assessment Pain Assessment: 0-10 Pain Score: 7  Pain Location: L foot Pain Descriptors / Indicators: Pounding, Throbbing Pain Intervention(s): Limited activity within patient's tolerance, Monitored during session, Repositioned    Home Living  Prior Function            PT Goals (current goals can now be found in the care plan section) Acute Rehab PT Goals Patient Stated Goal: to get better, foot healed PT Goal Formulation: With patient Time For Goal Achievement: 04/13/22 Potential to Achieve Goals: Good Progress towards PT goals: Progressing toward goals    Frequency    Min 4X/week      PT Plan Current plan remains appropriate    Co-evaluation              AM-PAC PT "6 Clicks" Mobility   Outcome Measure  Help needed turning from your back to your side while in a flat bed without  using bedrails?: A Little Help needed moving from lying on your back to sitting on the side of a flat bed without using bedrails?: A Little Help needed moving to and from a bed to a chair (including a wheelchair)?: A Lot Help needed standing up from a chair using your arms (e.g., wheelchair or bedside chair)?: A Lot Help needed to walk in hospital room?: A Lot Help needed climbing 3-5 steps with a railing? : Total 6 Click Score: 13    End of Session Equipment Utilized During Treatment: Gait belt Activity Tolerance: Patient tolerated treatment well Patient left: in chair;with call bell/phone within reach;with chair alarm set Nurse Communication: Mobility status PT Visit Diagnosis: Other abnormalities of gait and mobility (R26.89);Difficulty in walking, not elsewhere classified (R26.2);Muscle weakness (generalized) (M62.81)     Time: 9728-2060 PT Time Calculation (min) (ACUTE ONLY): 30 min  Charges:  $Gait Training: 23-37 mins                     Baxter Flattery, PT  Acute Rehab Dept ([place]) [phone] Pager [phone]  03/31/2022    [facility] 03/31/2022, 3:02 PM

## 2022-03-31 NOTE — Progress Notes (Signed)
Occupational Therapy Treatment Patient Details Name: Robert Burnett MRN: [id number] DOB: May 10, 1961 Today's Date: 03/31/2022   History of present illness 61 y.o. male admitted through emergency department due to worsening of left foot wound, evidence of gangrene left foot, necrotizing infection  Podiatry was consulted and he underwent transmetatarsal left amputation with I&D on 6/6.    PMH: seasonal allergies, unspecified anemia, DDD, asthma, cigarette smoker, type II DM, diabetic peripheral neuropathy, nonalcoholic fatty liver disease, GERD, hyperlipidemia, hypertension, history of pneumonia x3, urothelial cancer .   OT comments  Patient was educated on LB dressing tasks with AE with patient provided with total hip kit on this date. Patient verbalized and demonstrated ability to use AE on this date. Patient was educate don scooting with patient able to scoot along EOB both ways with MI. Patient's discharge plan remains appropriate at this time. OT will continue to follow acutely.     Recommendations for follow up therapy are one component of a multi-disciplinary discharge planning process, led by the attending physician.  Recommendations may be updated based on patient status, additional functional criteria and insurance authorization.    Follow Up Recommendations  Other (comment) (woudl benefit from SNF but no insurance)    Assistance Recommended at Discharge Frequent or constant Supervision/Assistance  Patient can return home with the following  A lot of help with walking and/or transfers;A lot of help with bathing/dressing/bathroom;Assistance with cooking/housework;Direct supervision/assist for financial management;Assist for transportation;Help with stairs or ramp for entrance;Direct supervision/assist for medications management   Equipment Recommendations  Other (comment) (drop arm BSC)    Recommendations for Other Services      Precautions / Restrictions Precautions Precautions:  Fall Required Braces or Orthoses: Splint/Cast Splint/Cast: L ankle splint Restrictions Weight Bearing Restrictions: Yes LLE Weight Bearing: Non weight bearing          Balance Overall balance assessment: Needs assistance Sitting-balance support: No upper extremity supported, Feet supported Sitting balance-[name]: Good     Standing balance support: During functional activity, Single extremity supported Standing balance-[name]: Fair                             ADL either performed or assessed with clinical judgement   ADL Overall ADL's : Needs assistance/impaired                       Lower Body Dressing Details (indicate cue type and reason): patient was educated on weight shifting and worked on standing with one UE support. patient was min guard for standing with one UE to simulate pants pulling up. patient verbalized and demosntrated understanding patietn ntoed to need cues to keep LLE off the ground with education provided on difference between NWB and TDWB. patient verbalized understanding. Toilet Transfer: Minimal Print production planner Details (indicate cue type and reason): patient transferred from reclienr to edge of bed with RW with hopping technique with min for balance and cues for maintaining WB restrictions.           General ADL Comments: patietn was able to participate in scooting up and down bed with education on scoot transfers to reduce falls risk in next level of care. patient verbalized understanding. patient reported he was going to buy a lift chiar at home to make it easier to get up. patient was educated on maintaining strength and  that lift chairs can be a crutch at times. patient verbalized understanding.  Extremity/Trunk Assessment              Vision       Perception     Praxis      Cognition Arousal/Alertness: Awake/alert Behavior During Therapy: WFL for tasks assessed/performed Overall Cognitive  Status: Within Functional Limits for tasks assessed                                          Exercises      Shoulder Instructions       General Comments      Pertinent Vitals/ Pain       Pain Assessment Pain Assessment: 0-10 Pain Score: 6  Pain Location: L foot, chest Pain Descriptors / Indicators: Grimacing, Sore Pain Intervention(s): Limited activity within patient's tolerance, Monitored during session, Repositioned  Home Living                                          Prior Functioning/Environment              Frequency  Min 2X/week        Progress Toward Goals  OT Goals(current goals can now be found in the care plan section)        Plan Discharge plan remains appropriate    Co-evaluation                 AM-PAC OT "6 Clicks" Daily Activity     Outcome Measure   Help from another person eating meals?: None Help from another person taking care of personal grooming?: None Help from another person toileting, which includes using toliet, bedpan, or urinal?: A Lot Help from another person bathing (including washing, rinsing, drying)?: A Little Help from another person to put on and taking off regular upper body clothing?: A Little Help from another person to put on and taking off regular lower body clothing?: A Little 6 Click Score: 19    End of Session Equipment Utilized During Treatment: Gait belt;Rolling walker (2 wheels)  OT Visit Diagnosis: Unsteadiness on feet (R26.81);Pain;Other abnormalities of gait and mobility (R26.89)   Activity Tolerance Patient tolerated treatment well   Patient Left in chair;with call bell/phone within reach   Nurse Communication Mobility status        Time: 0045-9977 OT Time Calculation (min): 35 min  Charges: OT General Charges $OT Visit: 1 Visit OT Treatments $Self Care/Home Management : 23-37 mins  Jackelyn Poling OTR/L, MS Acute Rehabilitation Department Office#  [phone] Pager# [phone]   Marcellina Millin 03/31/2022, 10:15 AM

## 2022-03-31 NOTE — Progress Notes (Signed)
ID Brief Note      Component 6 d ago  Specimen Description ABSCESS FOOT LEFT  Performed at [facility] [address] [place], [place] [address]   Special Requests NONE  Performed at [facility], [place] [address] [place], [place] [address]   Gram Stain NO SQUAMOUS EPITHELIAL CELLS SEEN  FEW WBC SEEN  FEW GRAM POSITIVE RODS  FEW GRAM NEGATIVE RODS  FEW GRAM POSITIVE COCCI   Culture CULTURE REINCUBATED FOR BETTER GROWTH  NO ANAEROBES ISOLATED  Performed at [facility], [place] [address], [place], [place] [address]   Report Status PENDING       Rosiland Oz, MD Infectious Disease Physician [facility] [address] [place], [place] [address] Phone: [phone]  Fax: [phone]

## 2022-04-01 ENCOUNTER — Ambulatory Visit: Payer: Self-pay | Admitting: Podiatry

## 2022-04-01 DIAGNOSIS — M86172 Other acute osteomyelitis, left ankle and foot: Secondary | ICD-10-CM

## 2022-04-01 DIAGNOSIS — Z89432 Acquired absence of left foot: Secondary | ICD-10-CM

## 2022-04-01 DIAGNOSIS — B951 Streptococcus, group B, as the cause of diseases classified elsewhere: Secondary | ICD-10-CM

## 2022-04-01 DIAGNOSIS — R7881 Bacteremia: Secondary | ICD-10-CM

## 2022-04-01 LAB — CULTURE, BLOOD (ROUTINE X 2)
Culture: NO GROWTH
Culture: NO GROWTH
Special Requests: ADEQUATE

## 2022-04-01 LAB — CBC
HCT: 29.6 % — ABNORMAL LOW (ref 39.0–52.0)
Hemoglobin: 9.8 g/dL — ABNORMAL LOW (ref 13.0–17.0)
MCH: 31.1 pg (ref 26.0–34.0)
MCHC: 33.1 g/dL (ref 30.0–36.0)
MCV: 94 fL (ref 80.0–100.0)
Platelets: 204 10*3/uL (ref 150–400)
RBC: 3.15 MIL/uL — ABNORMAL LOW (ref 4.22–5.81)
RDW: 12.6 % (ref 11.5–15.5)
WBC: 5.5 10*3/uL (ref 4.0–10.5)
nRBC: 0 % (ref 0.0–0.2)

## 2022-04-01 LAB — GLUCOSE, CAPILLARY
Glucose-Capillary: 89 mg/dL (ref 70–99)
Glucose-Capillary: 146 mg/dL — ABNORMAL HIGH (ref 70–99)
Glucose-Capillary: 183 mg/dL — ABNORMAL HIGH (ref 70–99)

## 2022-04-01 LAB — BASIC METABOLIC PANEL
Anion gap: 7 (ref 5–15)
BUN: 25 mg/dL — ABNORMAL HIGH (ref 8–23)
CO2: 28 mmol/L (ref 22–32)
Calcium: 8.8 mg/dL — ABNORMAL LOW (ref 8.9–10.3)
Chloride: 100 mmol/L (ref 98–111)
Creatinine, Ser: 0.92 mg/dL (ref 0.61–1.24)
GFR, Estimated: >60 mL/min (ref >60)
Glucose, Bld: 172 mg/dL — ABNORMAL HIGH (ref 70–99)
Potassium: 4.6 mmol/L (ref 3.5–5.1)
Sodium: 135 mmol/L (ref 135–145)

## 2022-04-01 LAB — SURGICAL PATHOLOGY

## 2022-04-01 LAB — MAGNESIUM: Magnesium: 1.7 mg/dL (ref 1.7–2.4)

## 2022-04-01 NOTE — Progress Notes (Signed)
PROGRESS NOTE    Robert Burnett  WOE:[id number] DOB: 31-Oct-1960 DOA: 03/24/2022 PCP: [name], MD   Brief Narrative:  61 y.o. male with medical history significant of seasonal allergies, unspecified anemia, DDD, asthma, cigarette smoker, type II DM, diabetic peripheral neuropathy, nonalcoholic fatty liver disease, GERD, hyperlipidemia, hypertension, history of pneumonia x3, urothelial cancer who is coming to the emergency department due to worsening of left foot wound for the past 2 weeks, but particularly a lot worse in the last 2 days that it has changing color and been draining a foul-smelling discharge after he kept the wrap for a week.  Upon admission patient was noted to be hyperglycemic with evidence of gangrene left foot, necrotizing infection.  Podiatry was consulted and he underwent transmetatarsal left amputation with I&D on 6/6.  Eventually his blood cultures grew group B strep bacteremia, ID was consulted and patient was started on IV penicillin.  Patient underwent repeat amputation by podiatry on 6/9.  Drain was removed by podiatry on 6/11.  Now awaiting safe disposition, working with TOC.  Assessment & Plan:  Principal Problem:   Gangrene of left foot (HCC) Active Problems:   Diabetic peripheral neuropathy associated with type 2 diabetes mellitus (HCC)   Thoracic spondylosis with myelopathy   Type 2 diabetes mellitus with left diabetic foot infection (HCC)   Hypertension   Fatty liver disease, nonalcoholic   Pyogenic inflammation of bone (HCC)   Necrotizing soft tissue infection   Hypokalemia   Protein-calorie malnutrition, severe (HCC)   Abscess of tendon sheath, left ankle and foot     Severe left foot diabetic infection with wet gangrene and necrotizing infection with osteomyelitis Abscess noted in the area as well - Status post transmetatarsal amputation of the left foot with I&D by podiatry on 6/6.  Status post OR again by podiatry on 6/9 with Lisfranc amputation.   According to podiatry they were able to eradicate remaining infection with clean margins.  We will plan for total of minimum 2 weeks of antibiotics but will have ID weigh in on this as well. - Podiatry removed his drain on 6/11. - IV PCN; ID to weigh in on Abx.  -[place] ABI - normal. No further vascular eval for now, can follow up outptn .  Group B strep bacteremia - Currently on IV penicillin repeat cultures-no growth to date.  No need for TEE per ID - Echocardiogram-normal without evidence of vegetations - ID following  Diabetes mellitus type 2 with severe hyperglycemia; Better controlled.  - On home patient is on metformin 500 mg 3 times daily.  Hemoglobin A1c is 14.2.  Increase Semglee to 40 units daily, sliding scale and Accu-Chek.  NovoLog 3 units premeals.  Glipizide 5 mg daily  Thoracic spondylosis with myelopathy - Pain control  Essential hypertension - Atenolol  Nonalcoholic fatty liver disease - Monitor LFTs  Hypokalemia - Replete as necessary  Severe protein calorie malnutrition - Nutrition evaluation  DVT prophylaxis: SCDs Start: 03/25/22 0411  Code Status: Full  Family Communication: Family has periodically been updated  Status is: Inpatient Remains inpatient appropriate because: TOC working on safe dispo, in the meantime ID to work on   Subjective: Doing well remains afebrile.  No complaints at this time.  Examination: Constitutional: Not in acute distress Respiratory: Clear to auscultation bilaterally Cardiovascular: Normal sinus rhythm, no rubs Abdomen: Nontender nondistended good bowel sounds Musculoskeletal: No edema noted Skin: No rashes seen Neurologic: CN 2-12 grossly intact.  And nonfocal Psychiatric: Normal judgment  and insight. Alert and oriented x 3. Normal mood. Surgical site of the left foot noted, and dressing in place  Objective: Vitals:   03/31/22 1441 03/31/22 2033 04/01/22 0500 04/01/22 0544  BP: 130/74 122/80  123/76  Pulse: 65 60   (!) 59  Resp: '20 20  19  '$ Temp: 99 F (37.2 C) 98.4 F (36.9 C)  98.2 F (36.8 C)  TempSrc: Oral Oral  Oral  SpO2: 99% 100%  100%  Weight:   82.4 kg   Height:        Intake/Output Summary (Last 24 hours) at 04/01/2022 1206 Last data filed at 04/01/2022 0900 Gross per 24 hour  Intake 1732.62 ml  Output 3350 ml  Net -1617.38 ml   Filed Weights   03/29/22 0500 03/31/22 0327 04/01/22 0500  Weight: 86.5 kg 87.9 kg 82.4 kg     Data Reviewed:   CBC: Recent Labs  Lab 03/28/22 0417 03/29/22 0722 03/30/22 0442 03/31/22 0352 04/01/22 0355  WBC 6.3 6.9 5.5 5.8 5.5  HGB 9.0* 9.1* 8.5* 9.4* 9.8*  HCT 27.5* 28.0* 26.5* 29.1* 29.6*  MCV 94.5 94.6 95.7 95.4 94.0  PLT 150 162 170 191 672   Basic Metabolic Panel: Recent Labs  Lab 03/28/22 0417 03/29/22 0722 03/30/22 0442 03/31/22 0352 04/01/22 0355  NA 131* 130* 136 136 135  K 3.5 5.1 4.2 4.9 4.6  CL 100 102 106 103 100  CO2 24 20* '24 26 28  '$ GLUCOSE 296* 437* 147* 151* 172*  BUN '21 21 21 22 '$ 25*  CREATININE 1.13 1.12 1.19 1.03 0.92  CALCIUM 7.8* 7.6* 8.1* 8.6* 8.8*  MG 2.0 1.9 1.9 1.7 1.7   GFR: Estimated Creatinine Clearance: 92.5 mL/min (by C-G formula based on SCr of 0.92 mg/dL). Liver Function Tests: No results for input(s): "AST", "ALT", "ALKPHOS", "BILITOT", "PROT", "ALBUMIN" in the last 168 hours.  No results for input(s): "LIPASE", "AMYLASE" in the last 168 hours. No results for input(s): "AMMONIA" in the last 168 hours. Coagulation Profile: No results for input(s): "INR", "PROTIME" in the last 168 hours. Cardiac Enzymes: No results for input(s): "CKTOTAL", "CKMB", "CKMBINDEX", "TROPONINI" in the last 168 hours. BNP (last 3 results) No results for input(s): "PROBNP" in the last 8760 hours. HbA1C: No results for input(s): "HGBA1C" in the last 72 hours.  CBG: Recent Labs  Lab 03/30/22 2123 03/31/22 0727 03/31/22 1136 03/31/22 1706 03/31/22 2031  GLUCAP 131* 121* 92 93 178*   Lipid Profile: No  results for input(s): "CHOL", "HDL", "LDLCALC", "TRIG", "CHOLHDL", "LDLDIRECT" in the last 72 hours. Thyroid Function Tests: No results for input(s): "TSH", "T4TOTAL", "FREET4", "T3FREE", "THYROIDAB" in the last 72 hours. Anemia Panel: No results for input(s): "VITAMINB12", "FOLATE", "FERRITIN", "TIBC", "IRON", "RETICCTPCT" in the last 72 hours. Sepsis Labs: No results for input(s): "PROCALCITON", "LATICACIDVEN" in the last 168 hours.   Recent Results (from the past 240 hour(s))  Blood Cultures x 2 sites     Status: Abnormal   Collection Time: 03/25/22 12:30 AM   Specimen: BLOOD RIGHT WRIST  Result Value Ref Range Status   Specimen Description   Final    BLOOD RIGHT WRIST Performed at [facility] [address], [place], [place] [address]    Special Requests   Final    BOTTLES DRAWN AEROBIC AND ANAEROBIC Blood Culture adequate volume Performed at [facility] [address] [place], [place] [address]    Culture  Setup Time   Final    GRAM POSITIVE  COCCI IN BOTH AEROBIC AND ANAEROBIC BOTTLES CRITICAL RESULT CALLED TO, READ BACK BY AND VERIFIED WITH: PHARMD CHRISTINE SHADE ON 03/25/22 @ 1625 BY DRT Performed at [facility], [place] [address] [place], [place] [address]    Culture GROUP B STREP(S.AGALACTIAE)ISOLATED (A)  Final   Report Status 03/27/2022 FINAL  Final   Organism ID, Bacteria GROUP B STREP(S.AGALACTIAE)ISOLATED  Final      Susceptibility   Group b strep(s.agalactiae)isolated - MIC*    CLINDAMYCIN >=1 RESISTANT Resistant     AMPICILLIN <=0.25 SENSITIVE Sensitive     ERYTHROMYCIN >=8 RESISTANT Resistant     VANCOMYCIN 0.5 SENSITIVE Sensitive     CEFTRIAXONE <=0.12 SENSITIVE Sensitive     LEVOFLOXACIN 0.5 SENSITIVE Sensitive     * GROUP B STREP(S.AGALACTIAE)ISOLATED  Blood Culture ID Panel (Reflexed)     Status: Abnormal   Collection Time: 03/25/22 12:30 AM  Result Value Ref Range Status   Enterococcus faecalis NOT  DETECTED NOT DETECTED Final   Enterococcus Faecium NOT DETECTED NOT DETECTED Final   Listeria monocytogenes NOT DETECTED NOT DETECTED Final   Staphylococcus species NOT DETECTED NOT DETECTED Final   Staphylococcus aureus (BCID) NOT DETECTED NOT DETECTED Final   Staphylococcus epidermidis NOT DETECTED NOT DETECTED Final   Staphylococcus lugdunensis NOT DETECTED NOT DETECTED Final   Streptococcus species DETECTED (A) NOT DETECTED Final    Comment: CRITICAL RESULT CALLED TO, READ BACK BY AND VERIFIED WITH: PHARMD CHRISTINE SHADE ON 03/25/22 @ 1625 BY DRT    Streptococcus agalactiae DETECTED (A) NOT DETECTED Final    Comment: CRITICAL RESULT CALLED TO, READ BACK BY AND VERIFIED WITH: PHARMD CHRISTINE SHADE ON 03/25/22 @ 1625 BY DRT    Streptococcus pneumoniae NOT DETECTED NOT DETECTED Final   Streptococcus pyogenes NOT DETECTED NOT DETECTED Final   A.calcoaceticus-baumannii NOT DETECTED NOT DETECTED Final   Bacteroides fragilis NOT DETECTED NOT DETECTED Final   Enterobacterales NOT DETECTED NOT DETECTED Final   Enterobacter cloacae complex NOT DETECTED NOT DETECTED Final   Escherichia coli NOT DETECTED NOT DETECTED Final   Klebsiella aerogenes NOT DETECTED NOT DETECTED Final   Klebsiella oxytoca NOT DETECTED NOT DETECTED Final   Klebsiella pneumoniae NOT DETECTED NOT DETECTED Final   Proteus species NOT DETECTED NOT DETECTED Final   Salmonella species NOT DETECTED NOT DETECTED Final   Serratia marcescens NOT DETECTED NOT DETECTED Final   Haemophilus influenzae NOT DETECTED NOT DETECTED Final   Neisseria meningitidis NOT DETECTED NOT DETECTED Final   Pseudomonas aeruginosa NOT DETECTED NOT DETECTED Final   Stenotrophomonas maltophilia NOT DETECTED NOT DETECTED Final   Candida albicans NOT DETECTED NOT DETECTED Final   Candida auris NOT DETECTED NOT DETECTED Final   Candida glabrata NOT DETECTED NOT DETECTED Final   Candida krusei NOT DETECTED NOT DETECTED Final   Candida parapsilosis NOT  DETECTED NOT DETECTED Final   Candida tropicalis NOT DETECTED NOT DETECTED Final   Cryptococcus neoformans/gattii NOT DETECTED NOT DETECTED Final    Comment: Performed at [facility], [place] [address] [place], [place] [address]  Blood Cultures x 2 sites     Status: Abnormal   Collection Time: 03/25/22 12:41 AM   Specimen: BLOOD LEFT HAND  Result Value Ref Range Status   Specimen Description   Final    BLOOD LEFT HAND Performed at [facility] [address] [place], [place] [address]    Special Requests   Final    BOTTLES DRAWN AEROBIC ONLY Blood Culture results may  not be optimal due to an inadequate volume of blood received in culture bottles Performed at [facility] [address], [place], [place] [address]    Culture (A)  Final    GROUP B STREP(S.AGALACTIAE)ISOLATED SUSCEPTIBILITIES PERFORMED ON PREVIOUS CULTURE WITHIN THE LAST 5 DAYS. Performed at [facility], [place] [address], [place], [place] [address]    Report Status 03/27/2022 FINAL  Final  MRSA Next Gen by PCR, Nasal     Status: Abnormal   Collection Time: 03/25/22  5:48 AM   Specimen: Nasal Mucosa; Nasal Swab  Result Value Ref Range Status   MRSA by PCR Next Gen DETECTED (A) NOT DETECTED Final    Comment: (NOTE) The GeneXpert MRSA Assay (FDA approved for NASAL specimens only), is one component of a comprehensive MRSA colonization surveillance program. It is not intended to diagnose MRSA infection nor to guide or monitor treatment for MRSA infections. Test performance is not FDA approved in patients less than 23 years old. Performed at [facility], [place] [address], [place], [place] [address]   Aerobic/Anaerobic Culture w Gram Stain (surgical/deep wound)     Status: None (Preliminary result)   Collection Time: 03/25/22  7:28 PM   Specimen: Wound; Abscess  Result Value Ref Range Status   Specimen Description   Final    ABSCESS FOOT  LEFT Performed at [facility] [address], [place], [place] [address]    Special Requests   Final    NONE Performed at [facility], [place] [address], [place], [place] [address]    Gram Stain   Final    NO SQUAMOUS EPITHELIAL CELLS SEEN FEW WBC SEEN FEW GRAM POSITIVE RODS FEW GRAM NEGATIVE RODS FEW GRAM POSITIVE COCCI    Culture   Final    MODERATE GRAM NEGATIVE RODS FEW GROUP B STREP(S.AGALACTIAE)ISOLATED TESTING AGAINST S. AGALACTIAE NOT ROUTINELY PERFORMED DUE TO PREDICTABILITY OF AMP/PEN/VAN SUSCEPTIBILITY. Performed at [facility], [place] [address], [place], [place] [address]    Report Status PENDING  Incomplete  Culture, blood (Routine X 2) w Reflex to ID Panel     Status: None   Collection Time: 03/27/22  8:47 AM   Specimen: BLOOD LEFT WRIST  Result Value Ref Range Status   Specimen Description   Final    BLOOD LEFT WRIST Performed at [facility] [address], [place], [place] [address]    Special Requests   Final    BOTTLES DRAWN AEROBIC AND ANAEROBIC Blood Culture results may not be optimal due to an inadequate volume of blood received in culture bottles Performed at [facility] [address] [place], [place] [address]    Culture   Final    NO GROWTH 5 DAYS Performed at [facility], [place] [address] [place], [place] [address]    Report Status 04/01/2022 FINAL  Final  Culture, blood (Routine X 2) w Reflex to ID Panel     Status: None   Collection Time: 03/27/22  8:47 AM   Specimen: BLOOD  Result Value Ref Range Status   Specimen Description BLOOD BLOOD LEFT WRIST  Final   Special Requests   Final    BOTTLES DRAWN AEROBIC AND ANAEROBIC Blood Culture adequate volume   Culture   Final    NO GROWTH 5 DAYS Performed at [facility], [place] [address] [place],  [address]    Report Status 04/01/2022 FINAL  Final         Radiology Studies: No results  found.      Scheduled Meds:  atenolol  100 mg Oral Daily   atorvastatin  80 mg Oral Daily   B-complex with vitamin C  1 tablet Oral Daily   famotidine  20 mg Oral BID   fentaNYL  1 patch Transdermal Q72H   finasteride  5 mg Oral Daily   glipiZIDE  5 mg Oral QAC breakfast   insulin aspart  0-20 Units Subcutaneous TID WC   insulin aspart  0-5 Units Subcutaneous QHS   insulin aspart  3 Units Subcutaneous TID WC   insulin glargine-yfgn  40 Units Subcutaneous Daily   mupirocin ointment   Nasal BID   nutrition supplement (JUVEN)  1 packet Oral BID BM   Continuous Infusions:  penicillin G potassium 12 Million Units in dextrose 5 % 500 mL continuous infusion 12 Million Units (04/01/22 0500)     LOS: 7 days   Time spent= 35 mins    Keyonda Bickle Arsenio Loader, MD [facility]  If 7PM-7AM, please contact night-coverage  04/01/2022, 12:06 PM

## 2022-04-01 NOTE — Progress Notes (Signed)
[facility] for Infectious Disease   Reason for visit: Follow up on osteomyelitis  Interval History: s/p further operative debridement on 6/9 with further resection of infected bone with remaining bone healthy and viable.  WBC wnl. Remains afebrile.     Physical Exam: Constitutional:  Vitals:   04/01/22 0544 04/01/22 1206  BP: 123/76 118/72  Pulse: (!) 59 (!) 57  Resp: 19 20  Temp: 98.2 F (36.8 C) 98.4 F (36.9 C)  SpO2: 100% 99%   patient appears in NAD Respiratory: Normal respiratory effort MS: foot wrapped  Review of Systems: Constitutional: negative for fevers and chills  Lab Results  Component Value Date   WBC 5.5 04/01/2022   HGB 9.8 (L) 04/01/2022   HCT 29.6 (L) 04/01/2022   MCV 94.0 04/01/2022   PLT 204 04/01/2022    Lab Results  Component Value Date   CREATININE 0.92 04/01/2022   BUN 25 (H) 04/01/2022   NA 135 04/01/2022   K 4.6 04/01/2022   CL 100 04/01/2022   CO2 28 04/01/2022    Lab Results  Component Value Date   ALT 22 03/25/2022   AST 19 03/25/2022   ALKPHOS 71 03/25/2022     Microbiology: Recent Results (from the past 240 hour(s))  Blood Cultures x 2 sites     Status: Abnormal   Collection Time: 03/25/22 12:30 AM   Specimen: BLOOD RIGHT WRIST  Result Value Ref Range Status   Specimen Description   Final    BLOOD RIGHT WRIST Performed at [facility], [place] [address], [place], [place] [address]    Special Requests   Final    BOTTLES DRAWN AEROBIC AND ANAEROBIC Blood Culture adequate volume Performed at [facility] [address] [place], [place] [address]    Culture  Setup Time   Final    GRAM POSITIVE COCCI IN BOTH AEROBIC AND ANAEROBIC BOTTLES CRITICAL RESULT CALLED TO, READ BACK BY AND VERIFIED WITH: PHARMD CHRISTINE SHADE ON 03/25/22 @ 1625 BY DRT Performed at [facility], [place] [address], [place], [place] [address]    Culture GROUP B STREP(S.AGALACTIAE)ISOLATED (A)  Final    Report Status 03/27/2022 FINAL  Final   Organism ID, Bacteria GROUP B STREP(S.AGALACTIAE)ISOLATED  Final      Susceptibility   Group b strep(s.agalactiae)isolated - MIC*    CLINDAMYCIN >=1 RESISTANT Resistant     AMPICILLIN <=0.25 SENSITIVE Sensitive     ERYTHROMYCIN >=8 RESISTANT Resistant     VANCOMYCIN 0.5 SENSITIVE Sensitive     CEFTRIAXONE <=0.12 SENSITIVE Sensitive     LEVOFLOXACIN 0.5 SENSITIVE Sensitive     * GROUP B STREP(S.AGALACTIAE)ISOLATED  Blood Culture ID Panel (Reflexed)     Status: Abnormal   Collection Time: 03/25/22 12:30 AM  Result Value Ref Range Status   Enterococcus faecalis NOT DETECTED NOT DETECTED Final   Enterococcus Faecium NOT DETECTED NOT DETECTED Final   Listeria monocytogenes NOT DETECTED NOT DETECTED Final   Staphylococcus species NOT DETECTED NOT DETECTED Final   Staphylococcus aureus (BCID) NOT DETECTED NOT DETECTED Final   Staphylococcus epidermidis NOT DETECTED NOT DETECTED Final   Staphylococcus lugdunensis NOT DETECTED NOT DETECTED Final   Streptococcus species DETECTED (A) NOT DETECTED Final    Comment: CRITICAL RESULT CALLED TO, READ BACK BY AND VERIFIED WITH: PHARMD CHRISTINE SHADE ON 03/25/22 @ 1625 BY DRT    Streptococcus agalactiae DETECTED (A) NOT DETECTED Final    Comment: CRITICAL RESULT CALLED TO, READ BACK  BY AND VERIFIED WITH: PHARMD CHRISTINE SHADE ON 03/25/22 @ 1625 BY DRT    Streptococcus pneumoniae NOT DETECTED NOT DETECTED Final   Streptococcus pyogenes NOT DETECTED NOT DETECTED Final   A.calcoaceticus-baumannii NOT DETECTED NOT DETECTED Final   Bacteroides fragilis NOT DETECTED NOT DETECTED Final   Enterobacterales NOT DETECTED NOT DETECTED Final   Enterobacter cloacae complex NOT DETECTED NOT DETECTED Final   Escherichia coli NOT DETECTED NOT DETECTED Final   Klebsiella aerogenes NOT DETECTED NOT DETECTED Final   Klebsiella oxytoca NOT DETECTED NOT DETECTED Final   Klebsiella pneumoniae NOT DETECTED NOT DETECTED Final    Proteus species NOT DETECTED NOT DETECTED Final   Salmonella species NOT DETECTED NOT DETECTED Final   Serratia marcescens NOT DETECTED NOT DETECTED Final   Haemophilus influenzae NOT DETECTED NOT DETECTED Final   Neisseria meningitidis NOT DETECTED NOT DETECTED Final   Pseudomonas aeruginosa NOT DETECTED NOT DETECTED Final   Stenotrophomonas maltophilia NOT DETECTED NOT DETECTED Final   Candida albicans NOT DETECTED NOT DETECTED Final   Candida auris NOT DETECTED NOT DETECTED Final   Candida glabrata NOT DETECTED NOT DETECTED Final   Candida krusei NOT DETECTED NOT DETECTED Final   Candida parapsilosis NOT DETECTED NOT DETECTED Final   Candida tropicalis NOT DETECTED NOT DETECTED Final   Cryptococcus neoformans/gattii NOT DETECTED NOT DETECTED Final    Comment: Performed at [facility], [place]. [address] [place], [place] [address]  Blood Cultures x 2 sites     Status: Abnormal   Collection Time: 03/25/22 12:41 AM   Specimen: BLOOD LEFT HAND  Result Value Ref Range Status   Specimen Description   Final    BLOOD LEFT HAND Performed at [facility] [address], [place], [place] [address]    Special Requests   Final    BOTTLES DRAWN AEROBIC ONLY Blood Culture results may not be optimal due to an inadequate volume of blood received in culture bottles Performed at [facility] [address], [place], [place] [address]    Culture (A)  Final    GROUP B STREP(S.AGALACTIAE)ISOLATED SUSCEPTIBILITIES PERFORMED ON PREVIOUS CULTURE WITHIN THE LAST 5 DAYS. Performed at [facility], [place] [address] [place], [place] [address]    Report Status 03/27/2022 FINAL  Final  MRSA Next Gen by PCR, Nasal     Status: Abnormal   Collection Time: 03/25/22  5:48 AM   Specimen: Nasal Mucosa; Nasal Swab  Result Value Ref Range Status   MRSA by PCR Next Gen DETECTED (A) NOT DETECTED Final    Comment: (NOTE) The GeneXpert MRSA Assay (FDA approved for NASAL  specimens only), is one component of a comprehensive MRSA colonization surveillance program. It is not intended to diagnose MRSA infection nor to guide or monitor treatment for MRSA infections. Test performance is not FDA approved in patients less than 9 years old. Performed at [facility], [place] [address] [place], [place] [address]   Aerobic/Anaerobic Culture w Gram Stain (surgical/deep wound)     Status: None (Preliminary result)   Collection Time: 03/25/22  7:28 PM   Specimen: Wound; Abscess  Result Value Ref Range Status   Specimen Description   Final    ABSCESS FOOT LEFT Performed at [facility] [address], [place], [place] [address]    Special Requests   Final    NONE Performed at [facility], [place] [address] [place], [place] [address]  Gram Stain   Final    NO SQUAMOUS EPITHELIAL CELLS SEEN FEW WBC SEEN FEW GRAM POSITIVE RODS FEW GRAM NEGATIVE RODS FEW GRAM POSITIVE COCCI    Culture   Final    MODERATE GRAM NEGATIVE RODS FEW GROUP B STREP(S.AGALACTIAE)ISOLATED TESTING AGAINST S. AGALACTIAE NOT ROUTINELY PERFORMED DUE TO PREDICTABILITY OF AMP/PEN/VAN SUSCEPTIBILITY. Performed at [facility], [place] [address], [place], [place] [address]    Report Status PENDING  Incomplete  Culture, blood (Routine X 2) w Reflex to ID Panel     Status: None   Collection Time: 03/27/22  8:47 AM   Specimen: BLOOD LEFT WRIST  Result Value Ref Range Status   Specimen Description   Final    BLOOD LEFT WRIST Performed at [facility] [address], [place], [place] [address]    Special Requests   Final    BOTTLES DRAWN AEROBIC AND ANAEROBIC Blood Culture results may not be optimal due to an inadequate volume of blood received in culture bottles Performed at [facility] [address] [place], [place] [address]    Culture   Final    NO GROWTH 5 DAYS Performed at [facility], [place] [address] [place], [place] [address]    Report Status 04/01/2022 FINAL  Final  Culture, blood (Routine X 2) w Reflex to ID Panel     Status: None   Collection Time: 03/27/22  8:47 AM   Specimen: BLOOD  Result Value Ref Range Status   Specimen Description BLOOD BLOOD LEFT WRIST  Final   Special Requests   Final    BOTTLES DRAWN AEROBIC AND ANAEROBIC Blood Culture adequate volume   Culture   Final    NO GROWTH 5 DAYS Performed at [facility], [place] [address], [place], [place] [address]    Report Status 04/01/2022 FINAL  Final    Impression/Plan:  1. Osteomyelitis left foot - he is now s/p amputation x 2 with removal of infected bone and tissue.  Also with abscess debridement.  Previous cultures noted with GNRs and group B Strep.  Also Group B Strep in blood cultures. On penicillin and no concerns.  Cultures most c/w Strep deep infection with infected tissue removed.  At this point, he can continue with penicillin inpatient and at discharge can take oral amoxicillin 1000 mg three times a day for 7 days further at discharge.    2.  Group B Strep bacteremia - repeat blood cultures without growth and final.  On treatment as above.  TTE without concern for infection.  No indication for TEE with negative repeat blood cultures and source as above.   Continue treatment as above.    3.  Diabetes - needs much improved control to improve chance of healing foot.    He will follow up with podiatry and we are available if needed after discharge.  Otherwise, I will sign off, call with any questions.

## 2022-04-02 LAB — CBC
HCT: 28.8 % — ABNORMAL LOW (ref 39.0–52.0)
Hemoglobin: 9.5 g/dL — ABNORMAL LOW (ref 13.0–17.0)
MCH: 31 pg (ref 26.0–34.0)
MCHC: 33 g/dL (ref 30.0–36.0)
MCV: 94.1 fL (ref 80.0–100.0)
Platelets: 201 10*3/uL (ref 150–400)
RBC: 3.06 MIL/uL — ABNORMAL LOW (ref 4.22–5.81)
RDW: 12.5 % (ref 11.5–15.5)
WBC: 5.4 10*3/uL (ref 4.0–10.5)
nRBC: 0 % (ref 0.0–0.2)

## 2022-04-02 LAB — BASIC METABOLIC PANEL
Anion gap: 9 (ref 5–15)
BUN: 26 mg/dL — ABNORMAL HIGH (ref 8–23)
CO2: 28 mmol/L (ref 22–32)
Calcium: 8.8 mg/dL — ABNORMAL LOW (ref 8.9–10.3)
Chloride: 96 mmol/L — ABNORMAL LOW (ref 98–111)
Creatinine, Ser: 0.98 mg/dL (ref 0.61–1.24)
GFR, Estimated: >60 mL/min (ref >60)
Glucose, Bld: 204 mg/dL — ABNORMAL HIGH (ref 70–99)
Potassium: 4.2 mmol/L (ref 3.5–5.1)
Sodium: 133 mmol/L — ABNORMAL LOW (ref 135–145)

## 2022-04-02 LAB — GLUCOSE, CAPILLARY
Glucose-Capillary: 214 mg/dL — ABNORMAL HIGH (ref 70–99)
Glucose-Capillary: 133 mg/dL — ABNORMAL HIGH (ref 70–99)
Glucose-Capillary: 173 mg/dL — ABNORMAL HIGH (ref 70–99)
Glucose-Capillary: 129 mg/dL — ABNORMAL HIGH (ref 70–99)

## 2022-04-02 LAB — MAGNESIUM: Magnesium: 1.6 mg/dL — ABNORMAL LOW (ref 1.7–2.4)

## 2022-04-02 NOTE — Progress Notes (Signed)
PROGRESS NOTE    Robert Burnett  EXH:[id number] DOB: 01/29/1961 DOA: 03/24/2022 PCP: [name], MD  Brief Narrative:61 y.o. male with medical history significant of seasonal allergies, unspecified anemia, DDD, asthma, cigarette smoker, type II DM, diabetic peripheral neuropathy, nonalcoholic fatty liver disease, GERD, hyperlipidemia, hypertension, history of pneumonia x3, urothelial cancer who is coming to the emergency department due to worsening of left foot wound for the past 2 weeks, but particularly a lot worse in the last 2 days that it has changing color and been draining a foul-smelling discharge after he kept the wrap for a week.  Upon admission patient was noted to be hyperglycemic with evidence of gangrene left foot, necrotizing infection.  Podiatry was consulted and he underwent transmetatarsal left amputation with I&D on 6/6.  Eventually his blood cultures grew group B strep bacteremia, ID was consulted and patient was started on IV penicillin.  Patient underwent repeat amputation by podiatry on 6/9.  Drain was removed by podiatry on 6/11.  Now awaiting safe disposition, working with TOC.  Assessment & Plan:   Principal Problem:   Gangrene of left foot (HCC) Active Problems:   Diabetic peripheral neuropathy associated with type 2 diabetes mellitus (HCC)   Thoracic spondylosis with myelopathy   Type 2 diabetes mellitus with left diabetic foot infection (HCC)   Hypertension   Fatty liver disease, nonalcoholic   Pyogenic inflammation of bone (HCC)   Necrotizing soft tissue infection   Hypokalemia   Protein-calorie malnutrition, severe (HCC)   Abscess of tendon sheath, left ankle and foot   Severe left foot diabetic infection with wet gangrene and necrotizing infection with osteomyelitis Abscess noted in the area as well - Status post transmetatarsal amputation of the left foot with I&D by podiatry on 6/6.  Status post OR again by podiatry on 6/9 with Lisfranc amputation.   According to podiatry they were able to eradicate remaining infection with clean margins.  We will plan for total of minimum 2 weeks of antibiotics but will have ID weigh in on this as well. - Podiatry removed his drain on 6/11. - IV PCN; ID to weigh in on Abx.  -[place] ABI - normal. No further vascular eval for now, can follow up outptn .   Group B strep bacteremia - Currently on IV penicillin repeat cultures-no growth to date.  No need for TEE per ID - Echocardiogram-normal without evidence of vegetations - ID following   Diabetes mellitus type 2 with severe hyperglycemia; Better controlled.  - On home patient is on metformin 500 mg 3 times daily.  Hemoglobin A1c is 14.2.  Increase Semglee to 40 units daily, sliding scale and Accu-Chek.  NovoLog 3 units premeals.  Glipizide 5 mg daily   Thoracic spondylosis with myelopathy - Pain control   Essential hypertension - Atenolol   Nonalcoholic fatty liver disease - Monitor LFTs   Hypokalemia - Replete as necessary  Severe protein calorie malnutrition - Nutrition evaluation       Nutrition Problem: Increased nutrient needs Etiology: acute illness, post-op healing, wound healing     Signs/Symptoms: estimated needs    Interventions: Juven, MVI  Estimated body mass index is 24.64 kg/m as calculated from the following:   Height as of this encounter: 6' (1.829 m).   Weight as of this encounter: 82.4 kg.  DVT prophylaxis: scd Code Status: full Family Communication:none Disposition Plan:  Status is: Inpatient Remains inpatient appropriate because: Waiting for safe disposition   Subjective: Resting in bed in  nad  Left leg covered with dressing  Objective: Vitals:   04/01/22 1206 04/01/22 2041 04/02/22 0530 04/02/22 1352  BP: 118/72 120/78 126/78 (!) 109/58  Pulse: (!) 57 68 (!) 54 64  Resp: '20  18 18  '$ Temp: 98.4 F (36.9 C) 98.5 F (36.9 C) 98.3 F (36.8 C) 99.8 F (37.7 C)  TempSrc: Oral   Oral  SpO2: 99% 100%  98% 95%  Weight:      Height:        Intake/Output Summary (Last 24 hours) at 04/02/2022 1502 Last data filed at 04/02/2022 1328 Gross per 24 hour  Intake 1677.6 ml  Output 3150 ml  Net -1472.4 ml   Filed Weights   03/29/22 0500 03/31/22 0327 04/01/22 0500  Weight: 86.5 kg 87.9 kg 82.4 kg    Examination:  General exam: Appears calm and comfortable  Respiratory system: Clear to auscultation. Respiratory effort normal. Cardiovascular system: S1 & S2 heard, RRR. No JVD, murmurs, rubs, gallops or clicks. No pedal edema. Gastrointestinal system: Abdomen is nondistended, soft and nontender. No organomegaly or masses felt. Normal bowel sounds heard. Central nervous system: Alert and oriented. No focal neurological deficits. Extremities: left leg wrapped  Skin: No rashes, lesions or ulcers Psychiatry: Judgement and insight appear normal. Mood & affect appropriate.     Data Reviewed: I have personally reviewed following labs and imaging studies  CBC: Recent Labs  Lab 03/29/22 0722 03/30/22 0442 03/31/22 0352 04/01/22 0355 04/02/22 0436  WBC 6.9 5.5 5.8 5.5 5.4  HGB 9.1* 8.5* 9.4* 9.8* 9.5*  HCT 28.0* 26.5* 29.1* 29.6* 28.8*  MCV 94.6 95.7 95.4 94.0 94.1  PLT 162 170 191 204 030   Basic Metabolic Panel: Recent Labs  Lab 03/29/22 0722 03/30/22 0442 03/31/22 0352 04/01/22 0355 04/02/22 0436  NA 130* 136 136 135 133*  K 5.1 4.2 4.9 4.6 4.2  CL 102 106 103 100 96*  CO2 20* '24 26 28 28  '$ GLUCOSE 437* 147* 151* 172* 204*  BUN '21 21 22 '$ 25* 26*  CREATININE 1.12 1.19 1.03 0.92 0.98  CALCIUM 7.6* 8.1* 8.6* 8.8* 8.8*  MG 1.9 1.9 1.7 1.7 1.6*   GFR: Estimated Creatinine Clearance: 86.9 mL/min (by C-G formula based on SCr of 0.98 mg/dL). Liver Function Tests: No results for input(s): "AST", "ALT", "ALKPHOS", "BILITOT", "PROT", "ALBUMIN" in the last 168 hours. No results for input(s): "LIPASE", "AMYLASE" in the last 168 hours. No results for input(s): "AMMONIA" in the last  168 hours. Coagulation Profile: No results for input(s): "INR", "PROTIME" in the last 168 hours. Cardiac Enzymes: No results for input(s): "CKTOTAL", "CKMB", "CKMBINDEX", "TROPONINI" in the last 168 hours. BNP (last 3 results) No results for input(s): "PROBNP" in the last 8760 hours. HbA1C: No results for input(s): "HGBA1C" in the last 72 hours. CBG: Recent Labs  Lab 04/01/22 1220 04/01/22 1639 04/01/22 2055 04/02/22 0746 04/02/22 1146  GLUCAP 89 146* 183* 214* 133*   Lipid Profile: No results for input(s): "CHOL", "HDL", "LDLCALC", "TRIG", "CHOLHDL", "LDLDIRECT" in the last 72 hours. Thyroid Function Tests: No results for input(s): "TSH", "T4TOTAL", "FREET4", "T3FREE", "THYROIDAB" in the last 72 hours. Anemia Panel: No results for input(s): "VITAMINB12", "FOLATE", "FERRITIN", "TIBC", "IRON", "RETICCTPCT" in the last 72 hours. Sepsis Labs: No results for input(s): "PROCALCITON", "LATICACIDVEN" in the last 168 hours.  Recent Results (from the past 240 hour(s))  Blood Cultures x 2 sites     Status: Abnormal   Collection Time: 03/25/22 12:30 AM   Specimen: BLOOD RIGHT  WRIST  Result Value Ref Range Status   Specimen Description   Final    BLOOD RIGHT WRIST Performed at [facility] [address] [place], [place] [address]    Special Requests   Final    BOTTLES DRAWN AEROBIC AND ANAEROBIC Blood Culture adequate volume Performed at [facility] [address], [place], [place] [address]    Culture  Setup Time   Final    GRAM POSITIVE COCCI IN BOTH AEROBIC AND ANAEROBIC BOTTLES CRITICAL RESULT CALLED TO, READ BACK BY AND VERIFIED WITH: PHARMD CHRISTINE SHADE ON 03/25/22 @ 1625 BY DRT Performed at [facility], [place] [address], [place], [place] [address]    Culture GROUP B STREP(S.AGALACTIAE)ISOLATED (A)  Final   Report Status 03/27/2022 FINAL  Final   Organism ID, Bacteria GROUP B STREP(S.AGALACTIAE)ISOLATED  Final       Susceptibility   Group b strep(s.agalactiae)isolated - MIC*    CLINDAMYCIN >=1 RESISTANT Resistant     AMPICILLIN <=0.25 SENSITIVE Sensitive     ERYTHROMYCIN >=8 RESISTANT Resistant     VANCOMYCIN 0.5 SENSITIVE Sensitive     CEFTRIAXONE <=0.12 SENSITIVE Sensitive     LEVOFLOXACIN 0.5 SENSITIVE Sensitive     * GROUP B STREP(S.AGALACTIAE)ISOLATED  Blood Culture ID Panel (Reflexed)     Status: Abnormal   Collection Time: 03/25/22 12:30 AM  Result Value Ref Range Status   Enterococcus faecalis NOT DETECTED NOT DETECTED Final   Enterococcus Faecium NOT DETECTED NOT DETECTED Final   Listeria monocytogenes NOT DETECTED NOT DETECTED Final   Staphylococcus species NOT DETECTED NOT DETECTED Final   Staphylococcus aureus (BCID) NOT DETECTED NOT DETECTED Final   Staphylococcus epidermidis NOT DETECTED NOT DETECTED Final   Staphylococcus lugdunensis NOT DETECTED NOT DETECTED Final   Streptococcus species DETECTED (A) NOT DETECTED Final    Comment: CRITICAL RESULT CALLED TO, READ BACK BY AND VERIFIED WITH: PHARMD CHRISTINE SHADE ON 03/25/22 @ 1625 BY DRT    Streptococcus agalactiae DETECTED (A) NOT DETECTED Final    Comment: CRITICAL RESULT CALLED TO, READ BACK BY AND VERIFIED WITH: PHARMD CHRISTINE SHADE ON 03/25/22 @ 1625 BY DRT    Streptococcus pneumoniae NOT DETECTED NOT DETECTED Final   Streptococcus pyogenes NOT DETECTED NOT DETECTED Final   A.calcoaceticus-baumannii NOT DETECTED NOT DETECTED Final   Bacteroides fragilis NOT DETECTED NOT DETECTED Final   Enterobacterales NOT DETECTED NOT DETECTED Final   Enterobacter cloacae complex NOT DETECTED NOT DETECTED Final   Escherichia coli NOT DETECTED NOT DETECTED Final   Klebsiella aerogenes NOT DETECTED NOT DETECTED Final   Klebsiella oxytoca NOT DETECTED NOT DETECTED Final   Klebsiella pneumoniae NOT DETECTED NOT DETECTED Final   Proteus species NOT DETECTED NOT DETECTED Final   Salmonella species NOT DETECTED NOT DETECTED Final   Serratia  marcescens NOT DETECTED NOT DETECTED Final   Haemophilus influenzae NOT DETECTED NOT DETECTED Final   Neisseria meningitidis NOT DETECTED NOT DETECTED Final   Pseudomonas aeruginosa NOT DETECTED NOT DETECTED Final   Stenotrophomonas maltophilia NOT DETECTED NOT DETECTED Final   Candida albicans NOT DETECTED NOT DETECTED Final   Candida auris NOT DETECTED NOT DETECTED Final   Candida glabrata NOT DETECTED NOT DETECTED Final   Candida krusei NOT DETECTED NOT DETECTED Final   Candida parapsilosis NOT DETECTED NOT DETECTED Final   Candida tropicalis NOT DETECTED NOT DETECTED Final   Cryptococcus neoformans/gattii NOT DETECTED NOT DETECTED Final    Comment: Performed at [facility], [place]  [address] [place], [place] [address]  Blood Cultures x 2 sites     Status: Abnormal   Collection Time: 03/25/22 12:41 AM   Specimen: BLOOD LEFT HAND  Result Value Ref Range Status   Specimen Description   Final    BLOOD LEFT HAND Performed at [facility] [address] [place], [place] [address]    Special Requests   Final    BOTTLES DRAWN AEROBIC ONLY Blood Culture results may not be optimal due to an inadequate volume of blood received in culture bottles Performed at [facility] [address], [place], [place] [address]    Culture (A)  Final    GROUP B STREP(S.AGALACTIAE)ISOLATED SUSCEPTIBILITIES PERFORMED ON PREVIOUS CULTURE WITHIN THE LAST 5 DAYS. Performed at [facility], [place] [address] [place], [place] [address]    Report Status 03/27/2022 FINAL  Final  MRSA Next Gen by PCR, Nasal     Status: Abnormal   Collection Time: 03/25/22  5:48 AM   Specimen: Nasal Mucosa; Nasal Swab  Result Value Ref Range Status   MRSA by PCR Next Gen DETECTED (A) NOT DETECTED Final    Comment: (NOTE) The GeneXpert MRSA Assay (FDA approved for NASAL specimens only), is one component of a comprehensive MRSA colonization surveillance program. It is not intended  to diagnose MRSA infection nor to guide or monitor treatment for MRSA infections. Test performance is not FDA approved in patients less than 74 years old. Performed at [facility], [place] [address] [place], [place] [address]   Aerobic/Anaerobic Culture w Gram Stain (surgical/deep wound)     Status: None (Preliminary result)   Collection Time: 03/25/22  7:28 PM   Specimen: Wound; Abscess  Result Value Ref Range Status   Specimen Description   Final    ABSCESS FOOT LEFT Performed at [facility] [address] [place], [place] [address]    Special Requests   Final    NONE Performed at [facility], [place] [address] [place], [place] [address]    Gram Stain   Final    NO SQUAMOUS EPITHELIAL CELLS SEEN FEW WBC SEEN FEW GRAM POSITIVE RODS FEW GRAM NEGATIVE RODS FEW GRAM POSITIVE COCCI    Culture   Final    MODERATE GRAM NEGATIVE RODS FEW GROUP B STREP(S.AGALACTIAE)ISOLATED TESTING AGAINST S. AGALACTIAE NOT ROUTINELY PERFORMED DUE TO PREDICTABILITY OF AMP/PEN/VAN SUSCEPTIBILITY. Performed at [facility], [place] [address], [place], [place] [address]    Report Status PENDING  Incomplete  Culture, blood (Routine X 2) w Reflex to ID Panel     Status: None   Collection Time: 03/27/22  8:47 AM   Specimen: BLOOD LEFT WRIST  Result Value Ref Range Status   Specimen Description   Final    BLOOD LEFT WRIST Performed at [facility] [address] [place], [place] [address]    Special Requests   Final    BOTTLES DRAWN AEROBIC AND ANAEROBIC Blood Culture results may not be optimal due to an inadequate volume of blood received in culture bottles Performed at [facility] [address], [place], [place] [address]    Culture   Final    NO GROWTH 5 DAYS Performed at [facility], [place] [address] [place],  [address]    Report Status 04/01/2022 FINAL  Final  Culture, blood  (Routine X 2) w Reflex to ID Panel  Status: None   Collection Time: 03/27/22  8:47 AM   Specimen: BLOOD  Result Value Ref Range Status   Specimen Description BLOOD BLOOD LEFT WRIST  Final   Special Requests   Final    BOTTLES DRAWN AEROBIC AND ANAEROBIC Blood Culture adequate volume   Culture   Final    NO GROWTH 5 DAYS Performed at [facility], [address] [place], [place] [address]    Report Status 04/01/2022 FINAL  Final         Radiology Studies: No results found.      Scheduled Meds:  atenolol  100 mg Oral Daily   atorvastatin  80 mg Oral Daily   B-complex with vitamin C  1 tablet Oral Daily   famotidine  20 mg Oral BID   fentaNYL  1 patch Transdermal Q72H   finasteride  5 mg Oral Daily   glipiZIDE  5 mg Oral QAC breakfast   insulin aspart  0-20 Units Subcutaneous TID WC   insulin aspart  0-5 Units Subcutaneous QHS   insulin aspart  3 Units Subcutaneous TID WC   insulin glargine-yfgn  40 Units Subcutaneous Daily   mupirocin ointment   Nasal BID   nutrition supplement (JUVEN)  1 packet Oral BID BM   Continuous Infusions:  penicillin G potassium 12 Million Units in dextrose 5 % 500 mL continuous infusion 12 Million Units (04/02/22 0917)     LOS: 8 days    Time spent: 30 min  Georgette Shell, MD  04/02/2022, 3:02 PM

## 2022-04-02 NOTE — Progress Notes (Signed)
Nutrition Follow-up  DOCUMENTATION CODES:   Not applicable  INTERVENTION:  - continue Juven BID. - Carbohydrate Counting handout from the [employer] entered in AVS on 6/7. - will add smart phrase in AVS for patient to consume 2 bottles of oral nutrition supplement for at least 1 month after d/c.   NUTRITION DIAGNOSIS:   Increased nutrient needs related to acute illness, post-op healing, wound healing as evidenced by estimated needs. -ongoing  GOAL:   Patient will meet greater than or equal to 90% of their needs -minimally met on average  MONITOR:   PO intake, Supplement acceptance, Labs, Weight trends   ASSESSMENT:   61 y.o. male with medical history of seasonal allergies, unspecified anemia, DDD, asthma, cigarette smoker, type 2 DM, diabetic peripheral neuropathy, nonalcoholic fatty liver disease, GERD, HLD, HTN, and urothelial cancer. He presented to the ED due to worsening L foot wound x2 weeks that significantly worsened in the 2 days PTA. In the ED he was noted to be hyperglycemic and have evidence of gangrene to L foot and necrotizing infection. Podiatry was consulted and he underwent transmetatarsal L amputation with I&D on 6/6.   He has been eating 75-100% at meals from breakfast on 6/10-dinner yesterday. He has been accepting Juven 100% of the time offered.   Weight has been fluctuating throughout hospitalization. Mild pitting edema to RLE documented in the edema section of flow sheet.    Labs reviewed; HgbA1c: 14.2%, CBG: 214 mg/dl, Na: 133 mmol/l, Cl: 96 mmol/l, BUN: 26 mg/dl, Mg: 1.6 mg/dl.  Medications reviewed; 1 tablet vitamin B complex with vitamin C/day, 20 mg oral pepcid/day, 5 mg glucotrol/day, sliding scale novolog, 3 units novolog TID, 40 units semglee/day.   Diet Order:   Diet Order             Diet Carb Modified Fluid consistency: Thin; Room service appropriate? Yes  Diet effective now                   EDUCATION  NEEDS:   Education needs have been addressed  Skin:  Skin Assessment: Skin Integrity Issues: Skin Integrity Issues:: Incisions, Wound VAC Wound Vac: L foot Incisions: L foot (6/6)  Last BM:  6/8 (type 5, large amount) is the last documented in flow sheet  Height:   Ht Readings from Last 1 Encounters:  03/28/22 6' (1.829 m)    Weight:   Wt Readings from Last 1 Encounters:  04/01/22 82.4 kg     BMI:  Body mass index is 24.64 kg/m.  Estimated Nutritional Needs:  Kcal:  2200-2500 kcal Protein:  110-125 grams Fluid:  >/= 2.5 L/day     Jarome Matin, MS, RD, LDN Registered Dietitian II Inpatient Clinical Nutrition RD pager # and on-call/weekend pager # available in [facility]

## 2022-04-02 NOTE — Progress Notes (Addendum)
Physical Therapy Treatment Patient Details Name: Robert Burnett MRN: [id number] DOB: [birth year] Today's Date: 04/02/2022   History of Present Illness 61 y.o. male with medical history significant of seasonal allergies, unspecified anemia, DDD, asthma, cigarette smoker, type II DM, diabetic peripheral neuropathy, nonalcoholic fatty liver disease, GERD, hyperlipidemia, hypertension, history of pneumonia x3, urothelial cancer who is coming to the emergency department due to worsening of left foot wound for the past 2 weeks, but particularly a lot worse in the last 2 days that it has changing color and been draining a foul-smelling discharge after he kept the wrap for a week.  Upon admission patient was noted to be hyperglycemic with evidence of gangrene left foot, necrotizing infection.  Podiatry was consulted and he underwent transmetatarsal left amputation with I&D on 6/6.  Eventually his blood cultures grew group B strep bacteremia, ID was consulted and patient was started on IV penicillin.  Patient underwent repeat amputation by podiatry on 6/9.  Drain was removed by podiatry on 6/11.    PT Comments    Pt OOB in recliner.  AxO x 3 very pleasant.  Asissted with amb around room then practiced stairs.  General stair comments: pt has 2 steps NO rail to get "onto the porch".  Instructed and demonstrated up "backward" with walker only need to do one steps and have the wheelchair parked at top then pt can sit in wheelchair and will have enough room to turn wc around.  coming down, will only need to do one step forward with walker.  Also took a video using pt's phone (with permission) to demonstrate.  Educated on elevation.    Recommendations for follow up therapy are one component of a multi-disciplinary discharge planning process, led by the attending physician.  Recommendations may be updated based on patient status, additional functional criteria and insurance authorization.  Follow Up Recommendations   Home health PT     Assistance Recommended at Discharge Intermittent Supervision/Assistance  Patient can return home with the following Help with stairs or ramp for entrance;Assist for transportation;Assistance with cooking/housework   Equipment Recommendations  None recommended by PT  Pt HAS a walker and a wheelchair  Recommendations for Other Services       Precautions / Restrictions Precautions Precautions: Fall Required Braces or Orthoses: Splint/Cast Splint/Cast: L ankle splint Splint/Cast - Date Prophylactic Dressing Applied (if applicable): 84/69/62 Restrictions Weight Bearing Restrictions: Yes LLE Weight Bearing: Non weight bearing Other Position/Activity Restrictions: elevation     Mobility  Bed Mobility               General bed mobility comments: OOB in recliner    Transfers Overall transfer level: Needs assistance Equipment used: Rolling walker (2 wheels) Transfers: Sit to/from Stand Sit to Stand: Min guard, Min assist   Step pivot transfers: Min guard, Min assist       General transfer comment: cues for hand placement, NWB LLE/position. assist with anterior-superior wt shift and to steady on rising and transition to RW.    Ambulation/Gait Ambulation/Gait assistance: Min guard, Min assist Gait Distance (Feet): 12 Feet Assistive device: Rolling walker (2 wheels) Gait Pattern/deviations: Step-to pattern, Decreased stance time - left Gait velocity: decreased     General Gait Details: 25% cues for RW position, and to push through UEs. good adherence to NWB on LLE   Stairs Stairs: Yes Stairs assistance: Min assist Stair Management: No rails, Step to pattern, Backwards, With walker Number of Stairs: 1 General stair comments: pt has 2  steps NO rail to get "onto the porch".  Instructed and demonstrated up "backward" with walker only need to do one steps and have the wheelchair parked at top then pt can sit in wheelchair and will have enough room to  turn wc around.  coming down, will only need to do one step forward with walker.  Also took a viseo using pt's phone (with permission) to demonstrate.   Wheelchair Mobility    Modified Rankin (Stroke Patients Only)       Balance                                            Cognition Arousal/Alertness: Awake/alert Behavior During Therapy: WFL for tasks assessed/performed Overall Cognitive Status: Within Functional Limits for tasks assessed                                 General Comments: AxO X 3 very motivated        Exercises      General Comments        Pertinent Vitals/Pain Pain Assessment Pain Assessment: Faces Faces Pain Scale: Hurts little more Pain Location: L foot when in depedent position Pain Descriptors / Indicators: Pounding, Throbbing Pain Intervention(s): Monitored during session, Premedicated before session, Repositioned    Home Living                          Prior Function            PT Goals (current goals can now be found in the care plan section) Progress towards PT goals: Progressing toward goals    Frequency    Min 4X/week      PT Plan Current plan remains appropriate    Co-evaluation              AM-PAC PT "6 Clicks" Mobility   Outcome Measure  Help needed turning from your back to your side while in a flat bed without using bedrails?: A Little Help needed moving from lying on your back to sitting on the side of a flat bed without using bedrails?: A Little Help needed moving to and from a bed to a chair (including a wheelchair)?: A Little Help needed standing up from a chair using your arms (e.g., wheelchair or bedside chair)?: A Little Help needed to walk in hospital room?: A Little Help needed climbing 3-5 steps with a railing? : A Lot 6 Click Score: 17    End of Session Equipment Utilized During Treatment: Gait belt Activity Tolerance: Patient tolerated treatment  well Patient left: in chair;with call bell/phone within reach;with chair alarm set Nurse Communication: Mobility status PT Visit Diagnosis: Other abnormalities of gait and mobility (R26.89);Difficulty in walking, not elsewhere classified (R26.2);Muscle weakness (generalized) (M62.81)     Time: 0175-1025 PT Time Calculation (min) (ACUTE ONLY): 34 min  Charges:  $Gait Training: 8-22 mins $Therapeutic Activity: 8-22 mins                     Rica Koyanagi  PTA Acute  Rehabilitation Services Pager      [phone] Office      [phone]

## 2022-04-03 LAB — GLUCOSE, CAPILLARY
Glucose-Capillary: 92 mg/dL (ref 70–99)
Glucose-Capillary: 179 mg/dL — ABNORMAL HIGH (ref 70–99)
Glucose-Capillary: 185 mg/dL — ABNORMAL HIGH (ref 70–99)
Glucose-Capillary: 96 mg/dL (ref 70–99)

## 2022-04-03 LAB — AEROBIC/ANAEROBIC CULTURE W GRAM STAIN (SURGICAL/DEEP WOUND): Gram Stain: NONE SEEN

## 2022-04-03 NOTE — Progress Notes (Signed)
PROGRESS NOTE    Robert Burnett  KPV:[id number] DOB: 07-Jul-1961 DOA: 03/24/2022 PCP: [name], MD  Brief Narrative:61 y.o. male with medical history significant of seasonal allergies, unspecified anemia, DDD, asthma, cigarette smoker, type II DM, diabetic peripheral neuropathy, nonalcoholic fatty liver disease, GERD, hyperlipidemia, hypertension, history of pneumonia x3, urothelial cancer who is coming to the emergency department due to worsening of left foot wound for the past 2 weeks, but particularly a lot worse in the last 2 days that it has changing color and been draining a foul-smelling discharge after he kept the wrap for a week.  Upon admission patient was noted to be hyperglycemic with evidence of gangrene left foot, necrotizing infection.  Podiatry was consulted and he underwent transmetatarsal left amputation with I&D on 6/6.  Eventually his blood cultures grew group B strep bacteremia, ID was consulted and patient was started on IV penicillin.  Patient underwent repeat amputation by podiatry on 6/9.  Drain was removed by podiatry on 6/11.  Now awaiting safe disposition, working with TOC.  Assessment & Plan:   Principal Problem:   Gangrene of left foot (HCC) Active Problems:   Diabetic peripheral neuropathy associated with type 2 diabetes mellitus (HCC)   Thoracic spondylosis with myelopathy   Type 2 diabetes mellitus with left diabetic foot infection (HCC)   Hypertension   Fatty liver disease, nonalcoholic   Pyogenic inflammation of bone (HCC)   Necrotizing soft tissue infection   Hypokalemia   Protein-calorie malnutrition, severe (HCC)   Abscess of tendon sheath, left ankle and foot   Severe left foot diabetic infection with wet gangrene and necrotizing infection with osteomyelitis Abscess noted in the area as well - Status post transmetatarsal amputation of the left foot with I&D by podiatry on 6/6.  Status post OR again by podiatry on 6/9 with Lisfranc amputation.   According to podiatry they were able to eradicate remaining infection with clean margins.  - Podiatry removed his drain on 6/11. -[place] ABI - normal. No further vascular eval for now, can follow up outptn . Plan Home tomorrow on p.o. antibiotics.   Group B strep bacteremia  repeat cultures-no growth to date.  No need for TEE per ID - Echocardiogram-normal without evidence of vegetations - ID following   Diabetes mellitus type 2 with severe hyperglycemia; Better controlled.  - On home patient is on metformin 500 mg 3 times daily.  Hemoglobin A1c is 14.2.  Increase Semglee to 40 units daily, sliding scale and Accu-Chek.  NovoLog 3 units premeals.  Glipizide 5 mg daily   Thoracic spondylosis with myelopathy - Pain control   Essential hypertension - Atenolol   Nonalcoholic fatty liver disease - Monitor LFTs   Hypokalemia - Replete as necessary  Severe protein calorie malnutrition - Nutrition evaluation       Nutrition Problem: Increased nutrient needs Etiology: acute illness, post-op healing, wound healing     Signs/Symptoms: estimated needs    Interventions: Juven, MVI  Estimated body mass index is 24.64 kg/m as calculated from the following:   Height as of this encounter: 6' (1.829 m).   Weight as of this encounter: 82.4 kg.  DVT prophylaxis: scd Code Status: full Family Communication:none Disposition Plan:  Status is: Inpatient Remains inpatient appropriate because: Waiting for safe disposition   Subjective: Resting in bed in nad  Left leg covered with dressing  Objective: Vitals:   04/02/22 0530 04/02/22 1352 04/02/22 2105 04/03/22 1258  BP: 126/78 (!) 109/58 124/75 117/77  Pulse: Marland Kitchen)  54 64 (!) 58 63  Resp: '18 18 18 19  '$ Temp: 98.3 F (36.8 C) 99.8 F (37.7 C) 98.3 F (36.8 C) 99 F (37.2 C)  TempSrc:  Oral Oral Oral  SpO2: 98% 95% 98% 99%  Weight:      Height:        Intake/Output Summary (Last 24 hours) at 04/03/2022 1345 Last data filed at  04/03/2022 0036 Gross per 24 hour  Intake 403.1 ml  Output 1525 ml  Net -1121.9 ml    Filed Weights   03/29/22 0500 03/31/22 0327 04/01/22 0500  Weight: 86.5 kg 87.9 kg 82.4 kg    Examination:  General exam: Appears calm and comfortable  Respiratory system: Clear to auscultation. Respiratory effort normal. Cardiovascular system: S1 & S2 heard, RRR. No JVD, murmurs, rubs, gallops or clicks. No pedal edema. Gastrointestinal system: Abdomen is nondistended, soft and nontender. No organomegaly or masses felt. Normal bowel sounds heard. Central nervous system: Alert and oriented. No focal neurological deficits. Extremities: left leg wrapped  Skin: No rashes, lesions or ulcers Psychiatry: Judgement and insight appear normal. Mood & affect appropriate.     Data Reviewed: I have personally reviewed following labs and imaging studies  CBC: Recent Labs  Lab 03/29/22 0722 03/30/22 0442 03/31/22 0352 04/01/22 0355 04/02/22 0436  WBC 6.9 5.5 5.8 5.5 5.4  HGB 9.1* 8.5* 9.4* 9.8* 9.5*  HCT 28.0* 26.5* 29.1* 29.6* 28.8*  MCV 94.6 95.7 95.4 94.0 94.1  PLT 162 170 191 204 491    Basic Metabolic Panel: Recent Labs  Lab 03/29/22 0722 03/30/22 0442 03/31/22 0352 04/01/22 0355 04/02/22 0436  NA 130* 136 136 135 133*  K 5.1 4.2 4.9 4.6 4.2  CL 102 106 103 100 96*  CO2 20* '24 26 28 28  '$ GLUCOSE 437* 147* 151* 172* 204*  BUN '21 21 22 '$ 25* 26*  CREATININE 1.12 1.19 1.03 0.92 0.98  CALCIUM 7.6* 8.1* 8.6* 8.8* 8.8*  MG 1.9 1.9 1.7 1.7 1.6*    GFR: Estimated Creatinine Clearance: 86.9 mL/min (by C-G formula based on SCr of 0.98 mg/dL). Liver Function Tests: No results for input(s): "AST", "ALT", "ALKPHOS", "BILITOT", "PROT", "ALBUMIN" in the last 168 hours. No results for input(s): "LIPASE", "AMYLASE" in the last 168 hours. No results for input(s): "AMMONIA" in the last 168 hours. Coagulation Profile: No results for input(s): "INR", "PROTIME" in the last 168 hours. Cardiac  Enzymes: No results for input(s): "CKTOTAL", "CKMB", "CKMBINDEX", "TROPONINI" in the last 168 hours. BNP (last 3 results) No results for input(s): "PROBNP" in the last 8760 hours. HbA1C: No results for input(s): "HGBA1C" in the last 72 hours. CBG: Recent Labs  Lab 04/02/22 1146 04/02/22 1628 04/02/22 2103 04/03/22 0746 04/03/22 1135  GLUCAP 133* 173* 129* 92 179*    Lipid Profile: No results for input(s): "CHOL", "HDL", "LDLCALC", "TRIG", "CHOLHDL", "LDLDIRECT" in the last 72 hours. Thyroid Function Tests: No results for input(s): "TSH", "T4TOTAL", "FREET4", "T3FREE", "THYROIDAB" in the last 72 hours. Anemia Panel: No results for input(s): "VITAMINB12", "FOLATE", "FERRITIN", "TIBC", "IRON", "RETICCTPCT" in the last 72 hours. Sepsis Labs: No results for input(s): "PROCALCITON", "LATICACIDVEN" in the last 168 hours.  Recent Results (from the past 240 hour(s))  Blood Cultures x 2 sites     Status: Abnormal   Collection Time: 03/25/22 12:30 AM   Specimen: BLOOD RIGHT WRIST  Result Value Ref Range Status   Specimen Description   Final    BLOOD RIGHT WRIST Performed at [facility],  [place] [address], [place], [place] [address]    Special Requests   Final    BOTTLES DRAWN AEROBIC AND ANAEROBIC Blood Culture adequate volume Performed at [place] [address] [place], [place] [address]    Culture  Setup Time   Final    GRAM POSITIVE COCCI IN BOTH AEROBIC AND ANAEROBIC BOTTLES CRITICAL RESULT CALLED TO, READ BACK BY AND VERIFIED WITH: PHARMD CHRISTINE SHADE ON 03/25/22 @ 1625 BY DRT Performed at [facility], [place] [address] [place], [place] [address]    Culture GROUP B STREP(S.AGALACTIAE)ISOLATED (A)  Final   Report Status 03/27/2022 FINAL  Final   Organism ID, Bacteria GROUP B STREP(S.AGALACTIAE)ISOLATED  Final      Susceptibility   Group b strep(s.agalactiae)isolated - MIC*    CLINDAMYCIN >=1 RESISTANT Resistant      AMPICILLIN <=0.25 SENSITIVE Sensitive     ERYTHROMYCIN >=8 RESISTANT Resistant     VANCOMYCIN 0.5 SENSITIVE Sensitive     CEFTRIAXONE <=0.12 SENSITIVE Sensitive     LEVOFLOXACIN 0.5 SENSITIVE Sensitive     * GROUP B STREP(S.AGALACTIAE)ISOLATED  Blood Culture ID Panel (Reflexed)     Status: Abnormal   Collection Time: 03/25/22 12:30 AM  Result Value Ref Range Status   Enterococcus faecalis NOT DETECTED NOT DETECTED Final   Enterococcus Faecium NOT DETECTED NOT DETECTED Final   Listeria monocytogenes NOT DETECTED NOT DETECTED Final   Staphylococcus species NOT DETECTED NOT DETECTED Final   Staphylococcus aureus (BCID) NOT DETECTED NOT DETECTED Final   Staphylococcus epidermidis NOT DETECTED NOT DETECTED Final   Staphylococcus lugdunensis NOT DETECTED NOT DETECTED Final   Streptococcus species DETECTED (A) NOT DETECTED Final    Comment: CRITICAL RESULT CALLED TO, READ BACK BY AND VERIFIED WITH: PHARMD CHRISTINE SHADE ON 03/25/22 @ 1625 BY DRT    Streptococcus agalactiae DETECTED (A) NOT DETECTED Final    Comment: CRITICAL RESULT CALLED TO, READ BACK BY AND VERIFIED WITH: PHARMD CHRISTINE SHADE ON 03/25/22 @ 1625 BY DRT    Streptococcus pneumoniae NOT DETECTED NOT DETECTED Final   Streptococcus pyogenes NOT DETECTED NOT DETECTED Final   A.calcoaceticus-baumannii NOT DETECTED NOT DETECTED Final   Bacteroides fragilis NOT DETECTED NOT DETECTED Final   Enterobacterales NOT DETECTED NOT DETECTED Final   Enterobacter cloacae complex NOT DETECTED NOT DETECTED Final   Escherichia coli NOT DETECTED NOT DETECTED Final   Klebsiella aerogenes NOT DETECTED NOT DETECTED Final   Klebsiella oxytoca NOT DETECTED NOT DETECTED Final   Klebsiella pneumoniae NOT DETECTED NOT DETECTED Final   Proteus species NOT DETECTED NOT DETECTED Final   Salmonella species NOT DETECTED NOT DETECTED Final   Serratia marcescens NOT DETECTED NOT DETECTED Final   Haemophilus influenzae NOT DETECTED NOT DETECTED Final    Neisseria meningitidis NOT DETECTED NOT DETECTED Final   Pseudomonas aeruginosa NOT DETECTED NOT DETECTED Final   Stenotrophomonas maltophilia NOT DETECTED NOT DETECTED Final   Candida albicans NOT DETECTED NOT DETECTED Final   Candida auris NOT DETECTED NOT DETECTED Final   Candida glabrata NOT DETECTED NOT DETECTED Final   Candida krusei NOT DETECTED NOT DETECTED Final   Candida parapsilosis NOT DETECTED NOT DETECTED Final   Candida tropicalis NOT DETECTED NOT DETECTED Final   Cryptococcus neoformans/gattii NOT DETECTED NOT DETECTED Final    Comment: Performed at [facility], [place] [address] [place],  [address]  Blood Cultures x 2 sites     Status: Abnormal   Collection Time: 03/25/22 12:41 AM  Specimen: BLOOD LEFT HAND  Result Value Ref Range Status   Specimen Description   Final    BLOOD LEFT HAND Performed at [place] [address] [place], [place] [address]    Special Requests   Final    BOTTLES DRAWN AEROBIC ONLY Blood Culture results may not be optimal due to an inadequate volume of blood received in culture bottles Performed at [place] [address] [place], [place] [address]    Culture (A)  Final    GROUP B STREP(S.AGALACTIAE)ISOLATED SUSCEPTIBILITIES PERFORMED ON PREVIOUS CULTURE WITHIN THE LAST 5 DAYS. Performed at [facility], [place] [address] [place], [place] [address]    Report Status 03/27/2022 FINAL  Final  MRSA Next Gen by PCR, Nasal     Status: Abnormal   Collection Time: 03/25/22  5:48 AM   Specimen: Nasal Mucosa; Nasal Swab  Result Value Ref Range Status   MRSA by PCR Next Gen DETECTED (A) NOT DETECTED Final    Comment: (NOTE) The GeneXpert MRSA Assay (FDA approved for NASAL specimens only), is one component of a comprehensive MRSA colonization surveillance program. It is not intended to diagnose MRSA infection nor to guide or monitor treatment for MRSA infections. Test performance is  not FDA approved in patients less than 55 years old. Performed at [facility], [place] [address] [place], [place] [address]   Aerobic/Anaerobic Culture w Gram Stain (surgical/deep wound)     Status: None   Collection Time: 03/25/22  7:28 PM   Specimen: Wound; Abscess  Result Value Ref Range Status   Specimen Description   Final    ABSCESS FOOT LEFT Performed at [place] [address] [place], [place] [address]    Special Requests   Final    NONE Performed at [facility], [place] [address] [place], [place] [address]    Gram Stain   Final    NO SQUAMOUS EPITHELIAL CELLS SEEN FEW WBC SEEN FEW GRAM POSITIVE RODS FEW GRAM NEGATIVE RODS FEW GRAM POSITIVE COCCI Performed at [facility], [place] [address] [place], [place] [address]    Culture   Final    MODERATE PROTEUS VULGARIS FEW GROUP B STREP(S.AGALACTIAE)ISOLATED TESTING AGAINST S. AGALACTIAE NOT ROUTINELY PERFORMED DUE TO PREDICTABILITY OF AMP/PEN/VAN SUSCEPTIBILITY. NO ANAEROBES ISOLATED; CULTURE IN PROGRESS FOR 5 DAYS    Report Status 04/03/2022 FINAL  Final   Organism ID, Bacteria PROTEUS VULGARIS  Final      Susceptibility   Proteus vulgaris - MIC*    AMPICILLIN >=32 RESISTANT Resistant     CEFAZOLIN >=64 RESISTANT Resistant     CEFEPIME 4 INTERMEDIATE Intermediate     CEFTAZIDIME <=1 SENSITIVE Sensitive     CIPROFLOXACIN <=0.25 SENSITIVE Sensitive     GENTAMICIN <=1 SENSITIVE Sensitive     IMIPENEM 2 SENSITIVE Sensitive     TRIMETH/SULFA <=20 SENSITIVE Sensitive     AMPICILLIN/SULBACTAM 16 INTERMEDIATE Intermediate     PIP/TAZO <=4 SENSITIVE Sensitive     * MODERATE PROTEUS VULGARIS  Culture, blood (Routine X 2) w Reflex to ID Panel     Status: None   Collection Time: 03/27/22  8:47 AM   Specimen: BLOOD LEFT WRIST  Result Value Ref Range Status   Specimen Description   Final    BLOOD LEFT WRIST Performed at [place]  [address] [place], [place] [address]    Special Requests   Final  BOTTLES DRAWN AEROBIC AND ANAEROBIC Blood Culture results may not be optimal due to an inadequate volume of blood received in culture bottles Performed at [facility], [place] [address], [place], [place] [address]    Culture   Final    NO GROWTH 5 DAYS Performed at [facility], [place] [address], [place], [place] [address]    Report Status 04/01/2022 FINAL  Final  Culture, blood (Routine X 2) w Reflex to ID Panel     Status: None   Collection Time: 03/27/22  8:47 AM   Specimen: BLOOD  Result Value Ref Range Status   Specimen Description BLOOD BLOOD LEFT WRIST  Final   Special Requests   Final    BOTTLES DRAWN AEROBIC AND ANAEROBIC Blood Culture adequate volume   Culture   Final    NO GROWTH 5 DAYS Performed at [facility], [place] [address] [place],  [address]    Report Status 04/01/2022 FINAL  Final         Radiology Studies: No results found.      Scheduled Meds:  atenolol  100 mg Oral Daily   atorvastatin  80 mg Oral Daily   B-complex with vitamin C  1 tablet Oral Daily   famotidine  20 mg Oral BID   fentaNYL  1 patch Transdermal Q72H   finasteride  5 mg Oral Daily   glipiZIDE  5 mg Oral QAC breakfast   insulin aspart  0-20 Units Subcutaneous TID WC   insulin aspart  0-5 Units Subcutaneous QHS   insulin aspart  3 Units Subcutaneous TID WC   insulin glargine-yfgn  40 Units Subcutaneous Daily   mupirocin ointment   Nasal BID   nutrition supplement (JUVEN)  1 packet Oral BID BM   Continuous Infusions:  penicillin G potassium 12 Million Units in dextrose 5 % 500 mL continuous infusion 12 Million Units (04/03/22 1046)     LOS: 9 days    Time spent: 30 min  Georgette Shell, MD  04/03/2022, 1:45 PM

## 2022-04-03 NOTE — TOC Progression Note (Addendum)
Transition of Care [facility]) - Progression Note    Patient Details  Name: Robert Burnett MRN: [id number] Date of Birth: August 11, 1961  [facility] CM/SW Contact  Purcell Mouton, RN Phone Number: 04/03/2022, 11:53 AM  Clinical Narrative:    Spoke with pt concerning discharge plans home, related to no SNF bed offers. Pt was okay with going home. Pt states that he will arrange for transportation home. Waiting on [facility] agency to agree to service pt at home. Pt states that have a RW at home.    Expected Discharge Plan: [place] Barriers to Discharge: Continued Medical Work up  Expected Discharge Plan and Services Expected Discharge Plan: [place]   Discharge Planning Services: CM Consult   Living arrangements for the past 2 months: Single Family Home                                       Social Determinants of Health (SDOH) Interventions    Readmission Risk Interventions     No data to display

## 2022-04-03 NOTE — Progress Notes (Signed)
PHYSICAL THERAPY  Checked on pt twice to see Eating lunch Bathroom with NT  Pt has had no SNF offers and now plans to D/C to home.  He HAS a walker and a wheelchair.  We did practice stairs yesterday and pt plans to use the wheelchair to get into house. Pt has met his mobility goals but will continue to follow.  Rica Koyanagi  PTA Acute  Rehabilitation Services Pager      [phone] Office      [phone]

## 2022-04-04 LAB — GLUCOSE, CAPILLARY
Glucose-Capillary: 119 mg/dL — ABNORMAL HIGH (ref 70–99)
Glucose-Capillary: 165 mg/dL — ABNORMAL HIGH (ref 70–99)
Glucose-Capillary: 82 mg/dL (ref 70–99)

## 2022-04-04 MED ORDER — INSULIN ASPART 100 UNIT/ML IJ SOLN
3.0000 [IU] | Freq: Three times a day (TID) | INTRAMUSCULAR | 11 refills | Status: DC
Start: 2022-04-04 — End: 2022-04-04

## 2022-04-04 MED ORDER — GLIPIZIDE 5 MG PO TABS: 5.0000 mg | ORAL_TABLET | Freq: Every day | ORAL | 2 refills | Status: DC

## 2022-04-04 MED ORDER — NOVOLIN 70/30 FLEXPEN RELION (70-30) 100 UNIT/ML ~~LOC~~ SUPN: 28.0000 [IU] | PEN_INJECTOR | Freq: Two times a day (BID) | SUBCUTANEOUS | 11 refills | Status: DC

## 2022-04-04 MED ORDER — B COMPLEX-C PO TABS: 1.0000 | ORAL_TABLET | Freq: Every day | ORAL | Status: DC

## 2022-04-04 MED ORDER — AMOXICILLIN 500 MG PO CAPS: 1000.0000 mg | ORAL_CAPSULE | Freq: Three times a day (TID) | ORAL | 0 refills | Status: DC

## 2022-04-04 MED ORDER — OXYCODONE HCL 10 MG PO TABS: 10.0000 mg | ORAL_TABLET | ORAL | 0 refills | Status: AC | PRN

## 2022-04-04 MED ORDER — ORAL CARE MOUTH RINSE: 15.0000 mL | OROMUCOSAL | Status: DC | PRN

## 2022-04-04 MED ORDER — INSULIN GLARGINE-YFGN 100 UNIT/ML ~~LOC~~ SOLN
40.0000 [IU] | Freq: Every day | SUBCUTANEOUS | 11 refills | Status: DC
Start: 2022-04-04 — End: 2022-04-04

## 2022-04-04 MED ORDER — "PEN NEEDLES 3/16"" 31G X 5 MM MISC": 28.0000 [IU] | Freq: Two times a day (BID) | 10 refills | Status: DC

## 2022-04-04 MED ORDER — FENTANYL 75 MCG/HR TD PT72: 1.0000 | MEDICATED_PATCH | TRANSDERMAL | 0 refills | Status: DC

## 2022-04-04 NOTE — Progress Notes (Signed)
All VS stable. Glucose under control with last cbg of 82.   RN reviewed discharge paperwork with patient and all questions were answered. Patient stated understanding.  Patient gathered all belongings.  NT transported patient to exit via wheelchair where family took patient in vehicle.

## 2022-04-04 NOTE — TOC Progression Note (Signed)
Transition of Care [facility]) - Progression Note    Patient Details  Name: Robert Burnett MRN: [id number] Date of Birth: Jan 09, 1961  Transition of Care [facility]) CM/SW Contact  Purcell Mouton, RN Phone Number: 04/04/2022, 11:38 AM  Clinical Narrative:     [name] will follow pt at home with HHPT. Pt has a RW and WC at home.   Expected Discharge Plan: [place] Barriers to Discharge: Continued Medical Work up  Expected Discharge Plan and Services Expected Discharge Plan: [place]   Discharge Planning Services: CM Consult   Living arrangements for the past 2 months: Single Family Home                                       Social Determinants of Health (SDOH) Interventions    Readmission Risk Interventions     No data to display

## 2022-04-04 NOTE — Discharge Summary (Signed)
Physician Discharge Summary  Robert Burnett JEH:[id number] DOB: 1960-11-11 DOA: 03/24/2022  PCP: [name], MD  Admit date: 03/24/2022 Discharge date: 04/04/2022  Admitted From: Home Disposition: Home  Recommendations for Outpatient Follow-up:  Follow up with PCP in 1-2 weeks Please obtain BMP/CBC in one week Please follow up with podiatry  Home Health: Yes Equipment/Devices: None Discharge Condition: Stable CODE STATUS: Full code Diet recommendation: Carb modified Brief/Interim Summary:  61 y.o. male with medical history significant of seasonal allergies, unspecified anemia, DDD, asthma, cigarette smoker, type II DM, diabetic peripheral neuropathy, nonalcoholic fatty liver disease, GERD, hyperlipidemia, hypertension, history of pneumonia x3, urothelial cancer who is coming to the emergency department due to worsening of left foot wound for the past 2 weeks, but particularly a lot worse in the last 2 days that it has changing color and been draining a foul-smelling discharge after he kept the wrap for a week.  Upon admission patient was noted to be hyperglycemic with evidence of gangrene left foot, necrotizing infection.  Podiatry was consulted and he underwent transmetatarsal left amputation with I&D on 6/6.  Eventually his blood cultures grew group B strep bacteremia, ID was consulted and patient was started on IV penicillin.  Patient underwent repeat amputation by podiatry on 6/9.  Drain was removed by podiatry on 6/11.   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  Status post OR again by podiatry on 6/9 with Lisfranc amputation.  According to podiatry they were able to eradicate remaining infection with clean margins.  - Podiatry removed his drain on 6/11. -[place] ABI - normal. No further vascular eval for now, can follow up outptn . He will be discharged home on amoxicillin 1000 mg 3 times a day for 7 days. Patient was followed by infectious disease throughout the hospital stay. He will follow-up with podiatrist he does not need to change his dressings till seen by podiatrist.   Group B strep bacteremia-DC home on amoxicillin 1000 mg 3 times a day for 7 days.  repeat cultures-no growth to date.  No need for TEE per ID - Echocardiogram-normal without evidence of vegetations   Diabetes mellitus type 2 with severe hyperglycemia; Better controlled.  He was discharged on 70/30 Novolin pen 28 units twice a day and glipizide.  His A1c was 14.2. Metformin was stopped.  Thoracic spondylosis with myelopathy Continue home medications including Duragesic patch and oxycodone.   Essential hypertension - Atenolol   Nonalcoholic fatty liver disease - Monitor LFTs as outpatient.   Hypokalemia resolved.  Severe protein calorie malnutrition -He was followed by nutritionist during the hospital stay.   Nutrition Problem: Increased nutrient needs Etiology: acute illness, post-op healing, wound healing    Signs/Symptoms: estimated needs     Interventions: Juven, MVI  Estimated body mass index is 23.47 kg/m as calculated  from the following:   Height as of this encounter: 6' (1.829 m).   Weight as of this encounter: 78.5 kg.  Discharge Instructions  Discharge Instructions     Call MD for:   difficulty breathing, headache or visual disturbances   Complete by: As directed    Call MD for:  persistant nausea and vomiting   Complete by: As directed    Call MD for:  redness, tenderness, or signs of infection (pain, swelling, redness, odor or green/yellow discharge around incision site)   Complete by: As directed    Call MD for:  severe uncontrolled pain   Complete by: As directed    Diet - low sodium heart healthy   Complete by: As directed    Discharge wound care:   Complete by: As directed    See discharge instructions   Increase activity slowly   Complete by: As directed       Allergies as of 04/04/2022       Reactions   Prozac [fluoxetine Hcl]    Headaches    Amlodipine Rash        Medication List     STOP taking these medications    losartan-hydrochlorothiazide 50-12.5 MG tablet Commonly known as: HYZAAR   metFORMIN 500 MG 24 hr tablet Commonly known as: GLUCOPHAGE-XR   nitroGLYCERIN 0.2 mg/hr patch Commonly known as: NITRODUR - Dosed in mg/24 hr   phenazopyridine 200 MG tablet Commonly known as: Pyridium       TAKE these medications    amoxicillin 500 MG capsule Commonly known as: AMOXIL Take 2 capsules (1,000 mg total) by mouth 3 (three) times daily.   atenolol 100 MG tablet Commonly known as: TENORMIN Take 0.5 tablets (50 mg total) by mouth daily. What changed: how much to take   atorvastatin 80 MG tablet Commonly known as: LIPITOR Take 80 mg by mouth daily.   B-complex with vitamin C tablet Take 1 tablet by mouth daily.   fentaNYL 75 MCG/HR Commonly known as: [place] 1 patch onto the skin every 3 (three) days. Start taking on: April 05, 2022   finasteride 5 MG tablet Commonly known as: PROSCAR Take 5 mg by mouth daily.   freestyle lancets 1 each 2 (two) times daily.   FREESTYLE LITE test strip Generic drug: glucose blood 1 each 2 (two) times daily.   glipiZIDE 5 MG tablet Commonly known as: GLUCOTROL Take 1  tablet (5 mg total) by mouth daily before breakfast. Start taking on: April 05, 2022   insulin aspart 100 UNIT/ML injection Commonly known as: novoLOG Inject 3 Units into the skin 3 (three) times daily with meals.   insulin glargine-yfgn 100 UNIT/ML injection Commonly known as: SEMGLEE Inject 0.4 mLs (40 Units total) into the skin daily.   Oxycodone HCl 10 MG Tabs Take 1 tablet (10 mg total) by mouth every 4 (four) hours as needed for up to 7 days (pain).   pantoprazole 40 MG tablet Commonly known as: PROTONIX Take 1 tablet (40 mg total) by mouth daily. What changed:  when to take this reasons to take this               Discharge Care Instructions  (From admission, onward)           Start     Ordered   04/04/22 0000  Discharge wound care:       Comments: See discharge instructions   04/04/22 1201  Follow-up Information     [place]. Go on 04/30/2022.   Why: go to clinic for insulin review and to obtain a pcp., Diabetic recheck and to obtain Pcp time of appointment is 0930 Contact information: [place] [id number] [place], [facility] Follow up.   Specialty: [place] Why: Will call before coming out to your home for home health physical therapy Contact information: [address] [place] [address] [phone]         [name], DPM Follow up.   Specialty: Podiatry Contact information: [place] [address] [phone]                Allergies  Allergen Reactions   Prozac [Fluoxetine Hcl]     Headaches    Amlodipine Rash    Consultations: Podiatry and infectious disease   Procedures/Studies: ECHOCARDIOGRAM COMPLETE  Result Date: 03/27/2022    ECHOCARDIOGRAM REPORT   Patient Name:   Robert Burnett Date of Exam: 03/27/2022 Medical Rec #:  [id number]        Height:        72.0 in Accession #:    [id number]       Weight:       179.5 lb Date of Birth:  08/18/1961         BSA:          2.035 m Patient Age:    71 years         BP:           114/71 mmHg Patient Gender: M                HR:           74 bpm. Exam Location:  Inpatient Procedure: 2D Echo, Cardiac Doppler and Color Doppler Indications:    Bacteremia  History:        Patient has no prior history of Echocardiogram examinations.                 Risk Factors:Hypertension and Diabetes.  Sonographer:    Jefferey Pica Referring Phys: [id number] ANKIT CHIRAG AMIN IMPRESSIONS  1. Left ventricular ejection fraction, by estimation, is 55 to 60%. The left ventricle has normal function. The left ventricle has no regional wall motion abnormalities. Left ventricular diastolic parameters were normal.  2. Right ventricular systolic function is normal. The right ventricular size is normal. There is mildly elevated pulmonary artery systolic pressure.  3. The mitral valve is normal in structure. Mild mitral valve regurgitation. No evidence of mitral stenosis.  4. The aortic valve is tricuspid. Aortic valve regurgitation is not visualized. No aortic stenosis is present.  5. The inferior vena cava is dilated in size with <50% respiratory variability, suggesting right atrial pressure of 15 mmHg. FINDINGS  Left Ventricle: Left ventricular ejection fraction, by estimation, is 55 to 60%. The left ventricle has normal function. The left ventricle has no regional wall motion abnormalities. The left ventricular internal cavity size was normal in size. There is  no left ventricular hypertrophy. Left ventricular diastolic parameters were normal. Indeterminate filling pressures. Right Ventricle: The right ventricular size is normal. No increase in right ventricular wall thickness. Right ventricular systolic function is normal. There is mildly elevated pulmonary artery systolic pressure. The tricuspid regurgitant velocity is 2.34  m/s, and with an assumed  right atrial  pressure of 15 mmHg, the estimated right ventricular systolic pressure is 16.3 mmHg. Left Atrium: Left atrial size was normal in size. Right Atrium: Right atrial size was normal in size. Pericardium: There is no evidence of pericardial effusion. Mitral Valve: The mitral valve is normal in structure. Mild mitral valve regurgitation. No evidence of mitral valve stenosis. Tricuspid Valve: The tricuspid valve is normal in structure. Tricuspid valve regurgitation is mild . No evidence of tricuspid stenosis. Aortic Valve: The aortic valve is tricuspid. Aortic valve regurgitation is not visualized. No aortic stenosis is present. Aortic valve peak gradient measures 7.1 mmHg. Pulmonic Valve: The pulmonic valve was normal in structure. Pulmonic valve regurgitation is not visualized. No evidence of pulmonic stenosis. Aorta: The aortic root is normal in size and structure. Venous: The inferior vena cava is dilated in size with less than 50% respiratory variability, suggesting right atrial pressure of 15 mmHg. IAS/Shunts: No atrial level shunt detected by color flow Doppler.  LEFT VENTRICLE PLAX 2D LVIDd:         5.20 cm   Diastology LVIDs:         3.80 cm   LV e' medial:    7.80 cm/s LV PW:         1.20 cm   LV E/e' medial:  13.1 LV IVS:        1.00 cm   LV e' lateral:   11.10 cm/s LVOT diam:     2.10 cm   LV E/e' lateral: 9.2 LV SV:         78 LV SV Index:   38 LVOT Area:     3.46 cm  RIGHT VENTRICLE             IVC RV Basal diam:  3.20 cm     IVC diam: 2.60 cm RV Mid diam:    3.50 cm RV S prime:     12.60 cm/s TAPSE (M-mode): 1.9 cm LEFT ATRIUM             Index        RIGHT ATRIUM           Index LA diam:        4.30 cm 2.11 cm/m   RA Area:     17.90 cm LA Vol (A2C):   41.0 ml 20.15 ml/m  RA Volume:   45.70 ml  22.46 ml/m LA Vol (A4C):   54.2 ml 26.64 ml/m LA Biplane Vol: 49.5 ml 24.33 ml/m  AORTIC VALVE                 PULMONIC VALVE AV Area (Vmax): 3.14 cm     PV Vmax:       0.69 m/s AV Vmax:         133.50 cm/s  PV Peak grad:  1.9 mmHg AV Peak Grad:   7.1 mmHg LVOT Vmax:      121.00 cm/s LVOT Vmean:     76.100 cm/s LVOT VTI:       0.225 m  AORTA Ao Root diam: 3.40 cm Ao Asc diam:  3.50 cm MITRAL VALVE                TRICUSPID VALVE MV Area (PHT): 4.57 cm     TR Peak grad:   21.9 mmHg MV Decel Time: 166 msec     TR Vmax:        234.00 cm/s MR Peak grad: 71.6 mmHg MR Vmax:  423.00 cm/s   SHUNTS MV E velocity: 102.00 cm/s  Systemic VTI:  0.22 m MV A velocity: 61.70 cm/s   Systemic Diam: 2.10 cm MV E/A ratio:  1.65 Skeet Latch MD Electronically signed by Skeet Latch MD Signature Date/Time: 03/27/2022/5:31:26 PM    Final    VAS [place] ABI WITH/WO TBI  Result Date: 03/25/2022  LOWER EXTREMITY DOPPLER STUDY Patient Name:  Robert Burnett  Date of Exam:   03/25/2022 Medical Rec #: [id number]         Accession #:    [id number] Date of Birth: [birth year]          Patient Gender: M Patient Age:   63 years Exam Location:  [facility] Procedure:      VAS [place] ABI WITH/WO TBI Referring Phys: DAVID ORTIZ --------------------------------------------------------------------------------  Indications: Ulceration. High Risk Factors: Hypertension, Diabetes.  Limitations: Today's exam was limited due to an open wound, involuntary patient              movement and bandages. Comparison Study: No prior studies. Performing Technologist: Carlos Levering RVT  Examination Guidelines: A complete evaluation includes at minimum, Doppler waveform signals and systolic blood pressure reading at the level of bilateral brachial, anterior tibial, and posterior tibial arteries, when vessel segments are accessible. Bilateral testing is considered an integral part of a complete examination. Photoelectric Plethysmograph (PPG) waveforms and toe systolic pressure readings are included as required and additional duplex testing as needed. Limited examinations for reoccurring indications may be performed as noted.  ABI Findings:  +---------+------------------+-----+---------+--------+ Right    Rt Pressure (mmHg)IndexWaveform Comment  +---------+------------------+-----+---------+--------+ Brachial 113                    triphasic         +---------+------------------+-----+---------+--------+ PTA      140               1.19 triphasic         +---------+------------------+-----+---------+--------+ DP       121               1.03 triphasic         +---------+------------------+-----+---------+--------+ Great Toe68                0.58                   +---------+------------------+-----+---------+--------+ +---------+------------------+-----+---------+----------+ Left     Lt Pressure (mmHg)IndexWaveform Comment    +---------+------------------+-----+---------+----------+ Brachial 118                    triphasic           +---------+------------------+-----+---------+----------+ PTA      137               1.16 biphasic            +---------+------------------+-----+---------+----------+ DP       125               1.06 biphasic            +---------+------------------+-----+---------+----------+ Great Toe                                Ulceration +---------+------------------+-----+---------+----------+  Summary: Right: Resting right ankle-brachial index is within normal range. No evidence of significant right lower extremity arterial disease. The right toe-brachial index is abnormal. Left: Resting left ankle-brachial index is within normal range. No evidence  of significant left lower extremity arterial disease. Unable to obtain TBI due to great toe ulceration. *See table(s) above for measurements and observations.  Electronically signed by Servando Snare MD on 03/25/2022 at 9:23:23 PM.    Final    MR FOOT LEFT WO CONTRAST  Result Date: 03/25/2022 CLINICAL DATA:  Bone mass or bone pain, foot, aggressive features on xray EXAM: MRI OF THE LEFT FOOT WITHOUT CONTRAST TECHNIQUE: Multiplanar,  multisequence MR imaging of the left forefoot was performed. No intravenous contrast was administered. COMPARISON:  X-ray 03/25/2022 FINDINGS: Severely motion degraded, essentially nondiagnostic exam. Uncooperative patient. Patient refused any further imaging. Partial second ray amputation. Decreased T1 marrow signal within the third toe and distal third metatarsal, and to a lesser degree within the distal fourth and fifth rays, evaluation markedly limited. Probable flexor tenosynovitis within the forefoot. Soft tissue swelling with wound or ulceration at the distal forefoot laterally. Numerous foci of susceptibility compatible with soft tissue gas as seen radiographically. IMPRESSION: 1. Severely motion degraded, essentially non-diagnostic exam. See above. 2. Osteomyelitis of the distal third, fourth, and fifth rays is not excluded. 3. Probable flexor tenosynovitis within the forefoot. 4. Soft tissue swelling with wound or ulceration at the distal forefoot laterally. Numerous foci of susceptibility compatible with soft tissue gas as seen radiographically. Electronically Signed   By: Davina Poke D.O.   On: 03/25/2022 14:24   DG CHEST PORT 1 VIEW  Result Date: 03/25/2022 CLINICAL DATA:  Preoperative evaluation EXAM: PORTABLE CHEST 1 VIEW COMPARISON:  2012 FINDINGS: The heart size and mediastinal contours are within normal limits. Both lungs are clear. No pleural effusion. The visualized skeletal structures are unremarkable. IMPRESSION: No acute process in the chest. Electronically Signed   By: Macy Mis M.D.   On: 03/25/2022 10:42   DG Foot Complete Left  Result Date: 03/25/2022 CLINICAL DATA:  Gangrene left foot. EXAM: LEFT FOOT - COMPLETE 3+ VIEW COMPARISON:  08/19/2021. FINDINGS: No acute fracture or dislocation. There has been amputation of the second digit and mid to distal second metatarsal. Degenerative changes are noted at the first metatarsophalangeal joint and midfoot. There is soft tissue  swelling at the forefoot with subcutaneous air about the third, fourth, and fifth digits and distal metatarsals. Lucencies are noted in the base of the proximal phalanx of the first digit. IMPRESSION: 1. Soft tissue swelling at the forefoot with subcutaneous air involving the third, fourth, and fifth digits and distal metatarsals, concerning for necrotizing fasciitis. 2. Lucencies in the base of the proximal phalanx of the fifth digit, possible osteomyelitis versus artifact overlying gas in the soft tissues. Electronically Signed   By: Brett Fairy M.D.   On: 03/25/2022 01:27   (Echo, Carotid, EGD, Colonoscopy, ERCP)    Subjective:  He is sitting up in bed in no acute distress he is anxious that he is going home  Discharge Exam: Vitals:   04/04/22 0354 04/04/22 1246  BP: 135/88 (!) 115/55  Pulse: (!) 59 (!) 59  Resp: 16 14  Temp: 98.2 F (36.8 C) 98.7 F (37.1 C)  SpO2: 100% 97%   Vitals:   04/03/22 2030 04/04/22 0354 04/04/22 0359 04/04/22 1246  BP: 119/72 135/88  (!) 115/55  Pulse: (!) 56 (!) 59  (!) 59  Resp: '16 16  14  '$ Temp: 98.3 F (36.8 C) 98.2 F (36.8 C)  98.7 F (37.1 C)  TempSrc: Oral Oral  Oral  SpO2: 99% 100%  97%  Weight:   78.5 kg  Height:        General: Pt is alert, awake, not in acute distress Cardiovascular: RRR, S1/S2 +, no rubs, no gallops Respiratory: CTA bilaterally, no wheezing, no rhonchi Abdominal: Soft, NT, ND, bowel sounds + Extremities: Left foot covered with dressing    The results of significant diagnostics from this hospitalization (including imaging, microbiology, ancillary and laboratory) are listed below for reference.     Microbiology: Recent Results (from the past 240 hour(s))  Aerobic/Anaerobic Culture w Gram Stain (surgical/deep wound)     Status: None   Collection Time: 03/25/22  7:28 PM   Specimen: Wound; Abscess  Result Value Ref Range Status   Specimen Description   Final    ABSCESS FOOT LEFT Performed at [facility] [address], [place], [place] [address]    Special Requests   Final    NONE Performed at [facility], [place] [address] [place], [place] [address]    Gram Stain   Final    NO SQUAMOUS EPITHELIAL CELLS SEEN FEW WBC SEEN FEW GRAM POSITIVE RODS FEW GRAM NEGATIVE RODS FEW GRAM POSITIVE COCCI Performed at [facility], [place] [address], [place], [place] [address]    Culture   Final    MODERATE PROTEUS VULGARIS FEW GROUP B STREP(S.AGALACTIAE)ISOLATED TESTING AGAINST S. AGALACTIAE NOT ROUTINELY PERFORMED DUE TO PREDICTABILITY OF AMP/PEN/VAN SUSCEPTIBILITY. NO ANAEROBES ISOLATED; CULTURE IN PROGRESS FOR 5 DAYS    Report Status 04/03/2022 FINAL  Final   Organism ID, Bacteria PROTEUS VULGARIS  Final      Susceptibility   Proteus vulgaris - MIC*    AMPICILLIN >=32 RESISTANT Resistant     CEFAZOLIN >=64 RESISTANT Resistant     CEFEPIME 4 INTERMEDIATE Intermediate     CEFTAZIDIME <=1 SENSITIVE Sensitive     CIPROFLOXACIN <=0.25 SENSITIVE Sensitive     GENTAMICIN <=1 SENSITIVE Sensitive     IMIPENEM 2 SENSITIVE Sensitive     TRIMETH/SULFA <=20 SENSITIVE Sensitive     AMPICILLIN/SULBACTAM 16 INTERMEDIATE Intermediate     PIP/TAZO <=4 SENSITIVE Sensitive     * MODERATE PROTEUS VULGARIS  Culture, blood (Routine X 2) w Reflex to ID Panel     Status: None   Collection Time: 03/27/22  8:47 AM   Specimen: BLOOD LEFT WRIST  Result Value Ref Range Status   Specimen Description   Final    BLOOD LEFT WRIST Performed at [facility] [address], [place], [place] [address]    Special Requests   Final    BOTTLES DRAWN AEROBIC AND ANAEROBIC Blood Culture results may not be optimal due to an inadequate volume of blood received in culture bottles Performed at [facility] [address], [place], [place] [address]    Culture   Final    NO GROWTH 5 DAYS Performed at [facility], [place] [address],  [place],  [address]    Report Status 04/01/2022 FINAL  Final  Culture, blood (Routine X 2) w Reflex to ID Panel     Status: None   Collection Time: 03/27/22  8:47 AM   Specimen: BLOOD  Result Value Ref Range Status   Specimen Description BLOOD BLOOD LEFT WRIST  Final   Special Requests   Final    BOTTLES DRAWN AEROBIC AND ANAEROBIC Blood Culture adequate volume   Culture   Final    NO GROWTH 5 DAYS Performed at [facility], [place] [address] [place], [place]  [address]    Report Status 04/01/2022 FINAL  Final     Labs: BNP (last 3 results) No results for input(s): "BNP" in the last 8760 hours. Basic Metabolic Panel: Recent Labs  Lab 03/29/22 0722 03/30/22 0442 03/31/22 0352 04/01/22 0355 04/02/22 0436  NA 130* 136 136 135 133*  K 5.1 4.2 4.9 4.6 4.2  CL 102 106 103 100 96*  CO2 20* '24 26 28 28  '$ GLUCOSE 437* 147* 151* 172* 204*  BUN '21 21 22 '$ 25* 26*  CREATININE 1.12 1.19 1.03 0.92 0.98  CALCIUM 7.6* 8.1* 8.6* 8.8* 8.8*  MG 1.9 1.9 1.7 1.7 1.6*   Liver Function Tests: No results for input(s): "AST", "ALT", "ALKPHOS", "BILITOT", "PROT", "ALBUMIN" in the last 168 hours. No results for input(s): "LIPASE", "AMYLASE" in the last 168 hours. No results for input(s): "AMMONIA" in the last 168 hours. CBC: Recent Labs  Lab 03/29/22 0722 03/30/22 0442 03/31/22 0352 04/01/22 0355 04/02/22 0436  WBC 6.9 5.5 5.8 5.5 5.4  HGB 9.1* 8.5* 9.4* 9.8* 9.5*  HCT 28.0* 26.5* 29.1* 29.6* 28.8*  MCV 94.6 95.7 95.4 94.0 94.1  PLT 162 170 191 204 201   Cardiac Enzymes: No results for input(s): "CKTOTAL", "CKMB", "CKMBINDEX", "TROPONINI" in the last 168 hours. BNP: Invalid input(s): "POCBNP" CBG: Recent Labs  Lab 04/03/22 1135 04/03/22 1650 04/03/22 2027 04/04/22 0744 04/04/22 1125  GLUCAP 179* 185* 96 119* 165*   D-Dimer No results for input(s): "DDIMER" in the last 72 hours. Hgb A1c No results for input(s): "HGBA1C" in the last 72 hours. Lipid Profile No results  for input(s): "CHOL", "HDL", "LDLCALC", "TRIG", "CHOLHDL", "LDLDIRECT" in the last 72 hours. Thyroid function studies No results for input(s): "TSH", "T4TOTAL", "T3FREE", "THYROIDAB" in the last 72 hours.  Invalid input(s): "FREET3" Anemia work up No results for input(s): "VITAMINB12", "FOLATE", "FERRITIN", "TIBC", "IRON", "RETICCTPCT" in the last 72 hours. Urinalysis    Component Value Date/Time   COLORURINE YELLOW 03/25/2022 0330   APPEARANCEUR CLEAR 03/25/2022 0330   LABSPEC 1.022 03/25/2022 0330   PHURINE 5.0 03/25/2022 0330   GLUCOSEU >=500 (A) 03/25/2022 0330   HGBUR NEGATIVE 03/25/2022 0330   BILIRUBINUR NEGATIVE 03/25/2022 0330   BILIRUBINUR small (A) 01/06/2019 1102   KETONESUR 20 (A) 03/25/2022 0330   PROTEINUR NEGATIVE 03/25/2022 0330   UROBILINOGEN 1.0 01/06/2019 1102   NITRITE NEGATIVE 03/25/2022 0330   LEUKOCYTESUR NEGATIVE 03/25/2022 0330   Sepsis Labs Recent Labs  Lab 03/30/22 0442 03/31/22 0352 04/01/22 0355 04/02/22 0436  WBC 5.5 5.8 5.5 5.4   Microbiology Recent Results (from the past 240 hour(s))  Aerobic/Anaerobic Culture w Gram Stain (surgical/deep wound)     Status: None   Collection Time: 03/25/22  7:28 PM   Specimen: Wound; Abscess  Result Value Ref Range Status   Specimen Description   Final    ABSCESS FOOT LEFT Performed at [facility] [address] [place], [place] [address]    Special Requests   Final    NONE Performed at [facility], [place] [address], [place], [place] [address]    Gram Stain   Final    NO SQUAMOUS EPITHELIAL CELLS SEEN FEW WBC SEEN FEW GRAM POSITIVE RODS FEW GRAM NEGATIVE RODS FEW GRAM POSITIVE COCCI Performed at [facility], [place] [address], [place], [place] [address]    Culture   Final    MODERATE PROTEUS VULGARIS FEW GROUP B STREP(S.AGALACTIAE)ISOLATED TESTING AGAINST S. AGALACTIAE NOT ROUTINELY PERFORMED DUE TO PREDICTABILITY OF  AMP/PEN/VAN SUSCEPTIBILITY. NO  ANAEROBES ISOLATED; CULTURE IN PROGRESS FOR 5 DAYS    Report Status 04/03/2022 FINAL  Final   Organism ID, Bacteria PROTEUS VULGARIS  Final      Susceptibility   Proteus vulgaris - MIC*    AMPICILLIN >=32 RESISTANT Resistant     CEFAZOLIN >=64 RESISTANT Resistant     CEFEPIME 4 INTERMEDIATE Intermediate     CEFTAZIDIME <=1 SENSITIVE Sensitive     CIPROFLOXACIN <=0.25 SENSITIVE Sensitive     GENTAMICIN <=1 SENSITIVE Sensitive     IMIPENEM 2 SENSITIVE Sensitive     TRIMETH/SULFA <=20 SENSITIVE Sensitive     AMPICILLIN/SULBACTAM 16 INTERMEDIATE Intermediate     PIP/TAZO <=4 SENSITIVE Sensitive     * MODERATE PROTEUS VULGARIS  Culture, blood (Routine X 2) w Reflex to ID Panel     Status: None   Collection Time: 03/27/22  8:47 AM   Specimen: BLOOD LEFT WRIST  Result Value Ref Range Status   Specimen Description   Final    BLOOD LEFT WRIST Performed at [facility] [address], [place], [place] [address]    Special Requests   Final    BOTTLES DRAWN AEROBIC AND ANAEROBIC Blood Culture results may not be optimal due to an inadequate volume of blood received in culture bottles Performed at [facility] [address], [place], [place] [address]    Culture   Final    NO GROWTH 5 DAYS Performed at [facility], [place] [address] [place], [place] [address]    Report Status 04/01/2022 FINAL  Final  Culture, blood (Routine X 2) w Reflex to ID Panel     Status: None   Collection Time: 03/27/22  8:47 AM   Specimen: BLOOD  Result Value Ref Range Status   Specimen Description BLOOD BLOOD LEFT WRIST  Final   Special Requests   Final    BOTTLES DRAWN AEROBIC AND ANAEROBIC Blood Culture adequate volume   Culture   Final    NO GROWTH 5 DAYS Performed at [facility], [place] [address], [place], [place] [address]    Report Status 04/01/2022 FINAL  Final     Time coordinating discharge: 36 minutes  SIGNED:   Georgette Shell,  MD  [facility] 04/04/2022, 1:45 PM

## 2022-04-04 NOTE — Plan of Care (Signed)

## 2022-04-04 NOTE — Plan of Care (Signed)
  Problem: Health Behavior/Discharge Planning: Goal: Ability to manage health-related needs will improve Outcome: Progressing   Problem: Clinical Measurements: Goal: Ability to maintain clinical measurements within normal limits will improve Outcome: Progressing   Problem: Activity: Goal: Risk for activity intolerance will decrease Outcome: Progressing   Problem: Nutrition: Goal: Adequate nutrition will be maintained Outcome: Progressing   Problem: Coping: Goal: Level of anxiety will decrease Outcome: Progressing   Problem: Elimination: Goal: Will not experience complications related to bowel motility Outcome: Progressing   Problem: Pain Managment: Goal: General experience of comfort will improve Outcome: Progressing   Problem: Safety: Goal: Ability to remain free from injury will improve Outcome: Progressing   Problem: Skin Integrity: Goal: Risk for impaired skin integrity will decrease Outcome: Progressing   Problem: Education: Goal: Ability to describe self-care measures that may prevent or decrease complications (Diabetes Survival Skills Education) will improve Outcome: Progressing   Problem: Coping: Goal: Ability to adjust to condition or change in health will improve Outcome: Progressing   Problem: Fluid Volume: Goal: Ability to maintain a balanced intake and output will improve Outcome: Progressing   Problem: Health Behavior/Discharge Planning: Goal: Ability to identify and utilize available resources and services will improve Outcome: Progressing Goal: Ability to manage health-related needs will improve Outcome: Progressing   Problem: Metabolic: Goal: Ability to maintain appropriate glucose levels will improve Outcome: Progressing   Problem: Nutritional: Goal: Maintenance of adequate nutrition will improve Outcome: Progressing Goal: Progress toward achieving an optimal weight will improve Outcome: Progressing   Problem: Skin Integrity: Goal:  Risk for impaired skin integrity will decrease Outcome: Progressing   Problem: Tissue Perfusion: Goal: Adequacy of tissue perfusion will improve Outcome: Progressing

## 2022-04-04 NOTE — Progress Notes (Signed)
Inpatient Diabetes Program Recommendations  AACE/ADA: New Consensus Statement on Inpatient Glycemic Control (2015)  Target Ranges:  Prepandial:   less than 140 mg/dL      Peak postprandial:   less than 180 mg/dL (1-2 hours)      Critically ill patients:  140 - 180 mg/dL   Lab Results  Component Value Date   GLUCAP 165 (H) 04/04/2022   HGBA1C 14.2 (H) 03/25/2022    Review of Glycemic Control  Pt to be discharged today. Asked if he had any questions about going home on insulin and he said he would not be able to afford Semglee and Novolog prescription. He looked up cost on [facility] and was concerned about the price.   MD contacted and will send home on affordable insulin pen and glipizide 5 mg with breakfast  Reviewed insulin pen administration and importance of monitoring blood sugars and f/u with PCP. Needs tight glucose control for healing.  Inpatient Diabetes Program Recommendations:    For home:  Novolin ReliOn 70/30 flexpen 28 units BID Glipizide 5 mg QAM  Prescriptions for insulin pen needles and blood glucose meter kit given.  Answered all questions/concerns.  Discussed with MD, RN.  Thank you. Lorenda Peck, RD, LDN, CDE Inpatient Diabetes Coordinator [phone]

## 2022-04-10 ENCOUNTER — Ambulatory Visit: Payer: Self-pay | Admitting: Podiatry

## 2022-04-16 ENCOUNTER — Telehealth: Payer: Self-pay

## 2022-04-16 NOTE — Telephone Encounter (Signed)
Amy called to get verbal orders for PT. Called her back to inform her that we did not see this patient while he was in the hospital and he does not have any follow up appointments with our clinic

## 2022-04-18 ENCOUNTER — Telehealth: Payer: Self-pay | Admitting: Podiatry

## 2022-04-18 NOTE — Telephone Encounter (Signed)
We did get pt in on 7.25 but pt had sugery 6.9.2023.Marland KitchenMarland Kitchen

## 2022-04-18 NOTE — Telephone Encounter (Signed)
Pt called to schedule an appt for hospital follow up from amputation.Next available with Dr Sherryle Lis is 8.14.2023. Can we get him in soomer?

## 2022-04-30 ENCOUNTER — Telehealth: Payer: Self-pay

## 2022-04-30 ENCOUNTER — Ambulatory Visit: Payer: Commercial Managed Care - HMO | Attending: Family Medicine | Admitting: Family Medicine

## 2022-04-30 ENCOUNTER — Encounter: Payer: Self-pay | Admitting: Family Medicine

## 2022-04-30 ENCOUNTER — Other Ambulatory Visit: Payer: Self-pay

## 2022-04-30 ENCOUNTER — Ambulatory Visit (INDEPENDENT_AMBULATORY_CARE_PROVIDER_SITE_OTHER): Payer: Self-pay

## 2022-04-30 VITALS — BP 150/74 | HR 64 | Temp 98.5°F | Ht 72.0 in | Wt 176.6 lb

## 2022-04-30 DIAGNOSIS — K219 Gastro-esophageal reflux disease without esophagitis: Secondary | ICD-10-CM | POA: Insufficient documentation

## 2022-04-30 DIAGNOSIS — R079 Chest pain, unspecified: Secondary | ICD-10-CM | POA: Insufficient documentation

## 2022-04-30 DIAGNOSIS — E11628 Type 2 diabetes mellitus with other skin complications: Secondary | ICD-10-CM

## 2022-04-30 DIAGNOSIS — Z89422 Acquired absence of other left toe(s): Secondary | ICD-10-CM | POA: Insufficient documentation

## 2022-04-30 DIAGNOSIS — L089 Local infection of the skin and subcutaneous tissue, unspecified: Secondary | ICD-10-CM

## 2022-04-30 DIAGNOSIS — J45909 Unspecified asthma, uncomplicated: Secondary | ICD-10-CM | POA: Insufficient documentation

## 2022-04-30 DIAGNOSIS — K760 Fatty (change of) liver, not elsewhere classified: Secondary | ICD-10-CM | POA: Insufficient documentation

## 2022-04-30 DIAGNOSIS — Z89439 Acquired absence of unspecified foot: Secondary | ICD-10-CM | POA: Insufficient documentation

## 2022-04-30 DIAGNOSIS — E08621 Diabetes mellitus due to underlying condition with foot ulcer: Secondary | ICD-10-CM

## 2022-04-30 DIAGNOSIS — E785 Hyperlipidemia, unspecified: Secondary | ICD-10-CM | POA: Insufficient documentation

## 2022-04-30 DIAGNOSIS — C662 Malignant neoplasm of left ureter: Secondary | ICD-10-CM | POA: Diagnosis not present

## 2022-04-30 DIAGNOSIS — L97522 Non-pressure chronic ulcer of other part of left foot with fat layer exposed: Secondary | ICD-10-CM

## 2022-04-30 DIAGNOSIS — I10 Essential (primary) hypertension: Secondary | ICD-10-CM | POA: Insufficient documentation

## 2022-04-30 DIAGNOSIS — Z7984 Long term (current) use of oral hypoglycemic drugs: Secondary | ICD-10-CM | POA: Insufficient documentation

## 2022-04-30 DIAGNOSIS — Z794 Long term (current) use of insulin: Secondary | ICD-10-CM | POA: Insufficient documentation

## 2022-04-30 LAB — GLUCOSE, POCT (MANUAL RESULT ENTRY): POC Glucose: 249 mg/dl — AB (ref 70–99)

## 2022-04-30 MED ORDER — FREESTYLE LANCETS MISC: 1.0000 | Freq: Three times a day (TID) | 11 refills | Status: DC

## 2022-04-30 MED ORDER — FREESTYLE LITE TEST VI STRP: 1.0000 | ORAL_STRIP | Freq: Three times a day (TID) | 11 refills | Status: DC

## 2022-04-30 NOTE — Progress Notes (Signed)
Subjective:  Patient ID: Robert Burnett, male    DOB: 1960-12-31  Age: 61 y.o. MRN: [id number]  CC: Hospitalization Follow-up   HPI Robert Burnett is a 61 y.o. 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  Blood cultures grew strep bacteremia and he was treated with IV antibiotic.  Subsequently discharged on amoxicillin.  Interval History: He has an upcoming appointment with podiatry on 05/08/2022.  Since discharge he has not had his wound dressing change.  Blood sugars at home have been between 141-151 and the highest has been 300. He has not been taking Glipizide as he states a Pharmacist told him it could cause hypoglycemia.  His BP is elevated and he did not take his antihypertensive today. At home BP was controlled at 127/60 previously.  He Complains of chest pain which last occurred yesterday at around 5am and he had to go lie down and lasts for up to an hour. Past Medical History:  Diagnosis Date   Allergy    Anemia    Arthritis    ARTHRITIS IN SPINE - MAKES PT HAVE CHEST PAIN- USES fENTANYL PATCH    Asthma    IN TEENS    Diabetes mellitus    toe amp 4/12 NOT ON MEDS IN 2 YEARS    Diabetic peripheral neuropathy associated with type 2 diabetes mellitus ([place]) 12/12/2011   Fatty liver disease, nonalcoholic 04/24/7340   GERD (gastroesophageal reflux disease)    Hyperlipidemia    Hypertension    Pneumonia    HX OF X 3    Urothelial cancer ([place])     Past Surgical History:  Procedure Laterality Date   ACHILLES TENDON SURGERY Left 03/28/2022   Procedure: ACHILLES LENGTHENING/KIDNER;  Surgeon: [name], DPM;  Location: [facility];  Service: Podiatry;  Laterality: Left;    AMPUTATION Left 03/25/2022   Procedure: AMPUTATION DIGITS,TRANSMETARSAL AMPUTATION, REMOVAL OF TOES AND BONE BEHIND TOES ;  Surgeon: [name], DPM;  Location: [facility];  Service: Podiatry;  Laterality: Left;   APPLICATION OF WOUND VAC Left 03/25/2022   Procedure: APPLICATION OF WOUND VAC;  Surgeon: [name], DPM;  Location: [facility];  Service: Podiatry;  Laterality: Left;   CYSTOSCOPY/RETROGRADE/URETEROSCOPY Left 10/29/2020   Procedure: CYSTOSCOPY/RETROGRADE/ LEFT URETEROSCOPY WITH BIOPSY, LEFT URETERAL STENT;  Surgeon: Raynelle Bring, MD;  Location: [facility];  Service: Urology;  Laterality: Left;   IRRIGATION AND DEBRIDEMENT FOOT Left 03/28/2022   Procedure: AMPUTATION LISFRANC, SECONDARY WOUND CLOSURE;  Surgeon: [name], DPM;  Location: [facility];  Service: Podiatry;  Laterality: Left;   KNEE SURGERY Bilateral 1975 and 1989   MULTIPLE TOOTH EXTRACTIONS     ROBOT ASSITED LAPAROSCOPIC NEPHROURETERECTOMY Left 12/13/2020   Procedure: XI ROBOT ASSITED LAPAROSCOPIC NEPHROURETERECTOMY , POST OPERATIVE INTRAVESICAL GEMCITABINE;  Surgeon: Raynelle Bring, MD;  Location: [facility];  Service: Urology;  Laterality: Left;  ONLY NEEDS 240 MIN   TOEM AMPUTATION  Left    2012, second toe   TRANSURETHRAL RESECTION OF BLADDER TUMOR N/A 08/29/2021   Procedure: TRANSURETHRAL RESECTION OF BLADDER TUMOR (TURBT) WITH CYSTOSCOPY;  Surgeon: Raynelle Bring, MD;  Location: [facility];  Service: Urology;  Laterality: N/A;  GENERAL ANESTHESIA WITH PARALYSIS   WISDOM TOOTH EXTRACTION  Family History  Problem Relation Age of Onset   Hypertension Father    Diabetes Father    Cancer Father    Diabetes Sister    Cancer Sister    Cancer Mother     Social History   Socioeconomic History   Marital status: Single    Spouse name: Not on file   Number of children: Not on file   Years of education: Not on file   Highest education level: Not on file  Occupational History   Not on file  Tobacco Use   Smoking  status: Every Day    Packs/day: 2.00    Years: 5.00    Total pack years: 10.00    Types: Pipe, Cigarettes   Smokeless tobacco: Former    Types: Nurse, children's Use: Never used  Substance and Sexual Activity   Alcohol use: No   Drug use: No   Sexual activity: Not on file  Other Topics Concern   Not on file  Social History Narrative   Not on file   Social Determinants of Health   Financial Resource Strain: Not on file  Food Insecurity: Not on file  Transportation Needs: Not on file  Physical Activity: Not on file  Stress: Not on file  Social Connections: Not on file    Allergies  Allergen Reactions   Prozac [Fluoxetine Hcl]     Headaches    Amlodipine Rash    Outpatient Medications Prior to Visit  Medication Sig Dispense Refill   amoxicillin (AMOXIL) 500 MG capsule Take 2 capsules (1,000 mg total) by mouth 3 (three) times daily. 42 capsule 0   atenolol (TENORMIN) 100 MG tablet Take 0.5 tablets (50 mg total) by mouth daily. (Patient taking differently: Take 100 mg by mouth daily.) 90 tablet 1   atorvastatin (LIPITOR) 80 MG tablet Take 80 mg by mouth daily.     B Complex-C (B-COMPLEX WITH VITAMIN C) tablet Take 1 tablet by mouth daily.     fentaNYL (DURAGESIC) 75 MCG/HR Place 1 patch onto the skin every 3 (three) days. 5 patch 0   finasteride (PROSCAR) 5 MG tablet Take 5 mg by mouth daily.     glipiZIDE (GLUCOTROL) 5 MG tablet Take 1 tablet (5 mg total) by mouth daily before breakfast. 30 tablet 2   insulin isophane & regular human KwikPen (NOVOLIN 70/30 KWIKPEN) (70-30) 100 UNIT/ML KwikPen Inject 28 Units into the skin 2 (two) times daily. 15 mL 11   Insulin Pen Needle (PEN NEEDLES 3/16") 31G X 5 MM MISC 28 Units by Does not apply route 2 (two) times daily. 200 each 10   pantoprazole (PROTONIX) 40 MG tablet Take 1 tablet (40 mg total) by mouth daily. (Patient taking differently: Take 40 mg by mouth daily as needed (acid reflux/indigestion).) 30 tablet 3    FREESTYLE LITE test strip 1 each 2 (two) times daily.     Lancets (FREESTYLE) lancets 1 each 2 (two) times daily.     No facility-administered medications prior to visit.     ROS Review of Systems  Constitutional:  Negative for activity change and appetite change.  HENT:  Negative for sinus pressure and sore throat.   Respiratory:  Positive for chest tightness. Negative for shortness of breath and wheezing.   Cardiovascular:  Negative for chest pain and palpitations.  Gastrointestinal:  Negative for abdominal distention, abdominal pain and constipation.  Genitourinary: Negative.   Musculoskeletal: Negative.   Psychiatric/Behavioral:  Negative for behavioral  problems and dysphoric mood.     Objective:  BP (!) 150/74   Pulse 64   Temp 98.5 F (36.9 C) (Oral)   Ht 6' (1.829 m)   Wt 176 lb 9.6 oz (80.1 kg)   SpO2 99%   BMI 23.95 kg/m      04/30/2022    9:47 AM 04/04/2022   12:46 PM 04/04/2022    3:59 AM  BP/Weight  Systolic BP 510 258   Diastolic BP 74 55   Wt. (Lbs) 176.6  173.06  BMI 23.95 kg/m2  23.47 kg/m2      Physical Exam Constitutional:      Appearance: He is well-developed.  Cardiovascular:     Rate and Rhythm: Normal rate.     Heart sounds: Normal heart sounds. No murmur heard. Pulmonary:     Effort: Pulmonary effort is normal.     Breath sounds: Normal breath sounds. No wheezing or rales.  Chest:     Chest wall: No tenderness.  Abdominal:     General: Bowel sounds are normal. There is no distension.     Palpations: Abdomen is soft. There is no mass.     Tenderness: There is no abdominal tenderness.  Musculoskeletal:        General: Normal range of motion.     Right lower leg: No edema.     Left lower leg: No edema.  Neurological:     Mental Status: He is alert and oriented to person, place, and time.  Psychiatric:        Mood and Affect: Mood normal.        Latest Ref Rng & Units 04/02/2022    4:36 AM 04/01/2022    3:55 AM 03/31/2022    3:52  AM  CMP  Glucose 70 - 99 mg/dL 204  172  151   BUN 8 - 23 mg/dL '26  25  22   '$ Creatinine 0.61 - 1.24 mg/dL 0.98  0.92  1.03   Sodium 135 - 145 mmol/L 133  135  136   Potassium 3.5 - 5.1 mmol/L 4.2  4.6  4.9   Chloride 98 - 111 mmol/L 96  100  103   CO2 22 - 32 mmol/L '28  28  26   '$ Calcium 8.9 - 10.3 mg/dL 8.8  8.8  8.6     Lipid Panel     Component Value Date/Time   CHOL 182 06/01/2014 1505   TRIG 263 (H) 06/01/2014 1505   HDL 35 (L) 06/01/2014 1505   CHOLHDL 5.2 06/01/2014 1505   VLDL 53 (H) 06/01/2014 1505   LDLCALC 94 06/01/2014 1505    CBC    Component Value Date/Time   WBC 5.4 04/02/2022 0436   RBC 3.06 (L) 04/02/2022 0436   HGB 9.5 (L) 04/02/2022 0436   HCT 28.8 (L) 04/02/2022 0436   PLT 201 04/02/2022 0436   MCV 94.1 04/02/2022 0436   MCH 31.0 04/02/2022 0436   MCHC 33.0 04/02/2022 0436   RDW 12.5 04/02/2022 0436   LYMPHSABS 0.9 03/25/2022 1011   MONOABS 0.9 03/25/2022 1011   EOSABS 0.0 03/25/2022 1011   BASOSABS 0.0 03/25/2022 1011    Lab Results  Component Value Date   HGBA1C 14.2 (H) 03/25/2022    Assessment & Plan:  1. Ureteral cancer, left [facility]) Status post transurethral resection of bladder tumor in 2022  2. Type 2 diabetes mellitus with left diabetic foot infection ([place]) Uncontrolled with A1c of 14.2 Blood sugars seem to  have improved somewhat Increase his NovoLog 70/30 from 28 units twice daily to 30 units twice daily Discontinue glipizide We will review blood sugar log at next visit and adjust regimen accordingly - FREESTYLE LITE test strip; 1 each by Other route 3 (three) times daily.  Dispense: 100 each; Refill: 11 - Lancets (FREESTYLE) lancets; 1 each by Other route 3 (three) times daily.  Dispense: 100 each; Refill: 11  3. Chest pain, unspecified type EKG reveals chronic ST changes compared to previous EKG He is at high risk of cardiovascular disease and will benefit from a stress test - Ambulatory referral to Cardiology  4. Primary  hypertension Uncontrolled due to not taking his medications today Medication adherence emphasized Counseled on blood pressure goal of less than 130/80, low-sodium, DASH diet, medication compliance, 150 minutes of moderate intensity exercise per week. Discussed medication compliance, adverse effects.  5. History of transmetatarsal amputation of foot ([place]) Has not had a hospital follow-up with his surgeon since discharge We have called the podiatry clinic and were able to secure an appointment for wound dressing change today and he will be going over there from the clinic Follow-up with podiatry    Meds ordered this encounter  Medications   FREESTYLE LITE test strip    Sig: 1 each by Other route 3 (three) times daily.    Dispense:  100 each    Refill:  11   Lancets (FREESTYLE) lancets    Sig: 1 each by Other route 3 (three) times daily.    Dispense:  100 each    Refill:  11    Follow-up: Return in about 1 month (around 05/31/2022) for Diabetes follow-up.       Charlott Rakes, MD, FAAFP. [facility] [place], [place]   04/30/2022, 10:59 AM

## 2022-04-30 NOTE — Progress Notes (Signed)
Patient in the office for dressing change post amputation.   Patient denies nausea, vomiting, fever and chills.   Dressing changed performed followed by splint application and advised patient to keep dress clean, dry and intact. Patient verbalized understanding.   Advised patient to continue monitoring for signs and symptoms of infection, if noted call the office or go to ED.   Patient scheduled to see Dr. Sherryle Lis for follow-up.

## 2022-04-30 NOTE — Patient Instructions (Signed)
Chest Wall Pain Chest wall pain is pain in or around the bones and muscles of your chest. Sometimes, an injury causes this pain. Excessive coughing or overuse of arm and chest muscles may also cause chest wall pain. Sometimes, the cause may not be known. This pain may take several weeks or longer to get better. Follow these instructions at home: Managing pain, stiffness, and swelling  If directed, put ice on the painful area: Put ice in a plastic bag. Place a towel between your skin and the bag. Leave the ice on for 20 minutes, 2-3 times per day. Activity Rest as told by your health care provider. Avoid activities that cause pain. These include any activities that use your chest muscles or your abdominal and side muscles to lift heavy items. Ask your health care provider what activities are safe for you. General instructions  Take over-the-counter and prescription medicines only as told by your health care provider. Do not use any products that contain nicotine or tobacco, such as cigarettes, e-cigarettes, and chewing tobacco. These can delay healing after injury. If you need help quitting, ask your health care provider. Keep all follow-up visits as told by your health care provider. This is important. Contact a health care provider if: You have a fever. Your chest pain becomes worse. You have new symptoms. Get help right away if: You have nausea or vomiting. You feel sweaty or light-headed. You have a cough with mucus from your lungs (sputum) or you cough up blood. You develop shortness of breath. These symptoms may represent a serious problem that is an emergency. Do not wait to see if the symptoms will go away. Get medical help right away. Call your local emergency services (911 in the [place] Do not drive yourself to the hospital. Summary Chest wall pain is pain in or around the bones and muscles of your chest. Depending on the cause, it may be treated with ice, rest, medicines, and  avoiding activities that cause pain. Contact a health care provider if you have a fever, worsening chest pain, or new symptoms. Get help right away if you feel light-headed or you develop shortness of breath. These symptoms may be an emergency. This information is not intended to replace advice given to you by your health care provider. Make sure you discuss any questions you have with your health care provider. Document Revised: 12/21/2020 Document Reviewed: 12/21/2020 Elsevier Patient Education  2023 Elsevier Inc.  

## 2022-04-30 NOTE — Progress Notes (Signed)
Slight chest pain. [id number]

## 2022-04-30 NOTE — Addendum Note (Signed)
Addended by: Gomez Cleverly on: 04/30/2022 11:17 AM   Modules accepted: Orders

## 2022-04-30 NOTE — Telephone Encounter (Signed)
I called [facility] to inquire about post op follow up for the patient. He stated he was instructed not to change the dressing on his foot, the surgeon would change it.  The patient had surgery 03/28/2022 and the dressing remains intact at his appointment with Dr Margarita Rana today.   I spoke to [place] at [facility] who said that the patient canceled his appointment with them on 04/10/2022.  She was able to schedule the patient to be seen today at 1130 for a dressing change with the nurse. If needed today, the doctor will be pulled in to see him. They will also keep the follow up appointment that he already has scheduled for 7/202/230.

## 2022-05-08 ENCOUNTER — Ambulatory Visit (INDEPENDENT_AMBULATORY_CARE_PROVIDER_SITE_OTHER): Payer: Commercial Managed Care - HMO | Admitting: Podiatry

## 2022-05-08 DIAGNOSIS — E08621 Diabetes mellitus due to underlying condition with foot ulcer: Secondary | ICD-10-CM

## 2022-05-08 DIAGNOSIS — L97522 Non-pressure chronic ulcer of other part of left foot with fat layer exposed: Secondary | ICD-10-CM | POA: Diagnosis not present

## 2022-05-12 NOTE — Progress Notes (Signed)
  Subjective:  Patient ID: Robert Burnett, male    DOB: [birth year],  MRN: [id number]  Chief Complaint  Patient presents with   Routine Post Op     L AMPUTATION DIGITS TRANSMETATARSAL AMPUTATION, REMOVAL OF TOES AND BONE BEHIND TOES, AMPUTATION LISFRANC, SECONDARY WOUND CLOSURE, a little pain inn the left foot, heel      61 y.o. male returns for post-op check.  Overall doing well  Review of Systems: Negative except as noted in the HPI. Denies N/V/F/Ch.   Objective:  There were no vitals filed for this visit. There is no height or weight on file to calculate BMI. Constitutional Well developed. Well nourished.  Vascular Foot warm and well perfused. Capillary refill normal to all digits.  Calf is soft and supple, no posterior calf or knee pain, negative Homans' sign  Neurologic Normal speech. Oriented to person, place, and time. Epicritic sensation to light touch grossly present bilaterally.  Dermatologic Incisions are well-healed  Orthopedic: He has minimal to palpation noted about the surgical site.   Assessment:   1. 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  S/p foot surgery left -Progressing as expected post-operatively. -WB Status: WBAT in surgical shoe, he may transition back to regular shoe gear, we will have him scheduled to be fitted for an amputation filler and a prescription for [facility] for this was written -Sutures: Staples removed today. -Medications: No refills required -He may resume regular bathing  Return in about 4 weeks (around 06/05/2022) for post op (no x-rays).

## 2022-05-13 ENCOUNTER — Ambulatory Visit: Payer: Self-pay | Admitting: Podiatry

## 2022-06-02 ENCOUNTER — Ambulatory Visit: Payer: MEDICAID | Admitting: Podiatry

## 2022-06-04 ENCOUNTER — Other Ambulatory Visit: Payer: Self-pay

## 2022-06-04 ENCOUNTER — Inpatient Hospital Stay (HOSPITAL_COMMUNITY)
Admission: EM | Admit: 2022-06-04 | Discharge: 2022-06-08 | DRG: 246 | Disposition: A | Discharge status: Home Health | Payer: Commercial Managed Care - HMO | Attending: Internal Medicine | Admitting: Internal Medicine

## 2022-06-04 ENCOUNTER — Emergency Department (HOSPITAL_COMMUNITY): Payer: Commercial Managed Care - HMO

## 2022-06-04 ENCOUNTER — Encounter (HOSPITAL_COMMUNITY): Payer: Self-pay | Admitting: Nurse Practitioner

## 2022-06-04 ENCOUNTER — Inpatient Hospital Stay (HOSPITAL_COMMUNITY): Payer: Commercial Managed Care - HMO

## 2022-06-04 DIAGNOSIS — I700 Atherosclerosis of aorta: Secondary | ICD-10-CM | POA: Diagnosis present

## 2022-06-04 DIAGNOSIS — R079 Chest pain, unspecified: Secondary | ICD-10-CM | POA: Diagnosis not present

## 2022-06-04 DIAGNOSIS — F17290 Nicotine dependence, other tobacco product, uncomplicated: Secondary | ICD-10-CM | POA: Diagnosis present

## 2022-06-04 DIAGNOSIS — E1165 Type 2 diabetes mellitus with hyperglycemia: Secondary | ICD-10-CM | POA: Diagnosis present

## 2022-06-04 DIAGNOSIS — Z833 Family history of diabetes mellitus: Secondary | ICD-10-CM

## 2022-06-04 DIAGNOSIS — E871 Hypo-osmolality and hyponatremia: Secondary | ICD-10-CM | POA: Diagnosis present

## 2022-06-04 DIAGNOSIS — E1122 Type 2 diabetes mellitus with diabetic chronic kidney disease: Secondary | ICD-10-CM | POA: Diagnosis present

## 2022-06-04 DIAGNOSIS — D631 Anemia in chronic kidney disease: Secondary | ICD-10-CM | POA: Diagnosis present

## 2022-06-04 DIAGNOSIS — I255 Ischemic cardiomyopathy: Secondary | ICD-10-CM | POA: Diagnosis present

## 2022-06-04 DIAGNOSIS — G8929 Other chronic pain: Secondary | ICD-10-CM | POA: Diagnosis present

## 2022-06-04 DIAGNOSIS — N179 Acute kidney failure, unspecified: Secondary | ICD-10-CM | POA: Diagnosis present

## 2022-06-04 DIAGNOSIS — E118 Type 2 diabetes mellitus with unspecified complications: Secondary | ICD-10-CM

## 2022-06-04 DIAGNOSIS — I10 Essential (primary) hypertension: Secondary | ICD-10-CM | POA: Diagnosis present

## 2022-06-04 DIAGNOSIS — L97529 Non-pressure chronic ulcer of other part of left foot with unspecified severity: Secondary | ICD-10-CM | POA: Diagnosis present

## 2022-06-04 DIAGNOSIS — I130 Hypertensive heart and chronic kidney disease with heart failure and stage 1 through stage 4 chronic kidney disease, or unspecified chronic kidney disease: Secondary | ICD-10-CM | POA: Diagnosis present

## 2022-06-04 DIAGNOSIS — K760 Fatty (change of) liver, not elsewhere classified: Secondary | ICD-10-CM | POA: Diagnosis present

## 2022-06-04 DIAGNOSIS — I480 Paroxysmal atrial fibrillation: Secondary | ICD-10-CM | POA: Diagnosis present

## 2022-06-04 DIAGNOSIS — Z794 Long term (current) use of insulin: Secondary | ICD-10-CM

## 2022-06-04 DIAGNOSIS — D696 Thrombocytopenia, unspecified: Secondary | ICD-10-CM | POA: Diagnosis not present

## 2022-06-04 DIAGNOSIS — Z79899 Other long term (current) drug therapy: Secondary | ICD-10-CM

## 2022-06-04 DIAGNOSIS — R12 Heartburn: Secondary | ICD-10-CM

## 2022-06-04 DIAGNOSIS — I214 Non-ST elevation (NSTEMI) myocardial infarction: Secondary | ICD-10-CM | POA: Diagnosis present

## 2022-06-04 DIAGNOSIS — M7989 Other specified soft tissue disorders: Secondary | ICD-10-CM

## 2022-06-04 DIAGNOSIS — R0602 Shortness of breath: Secondary | ICD-10-CM | POA: Diagnosis present

## 2022-06-04 DIAGNOSIS — Z8249 Family history of ischemic heart disease and other diseases of the circulatory system: Secondary | ICD-10-CM

## 2022-06-04 DIAGNOSIS — I5021 Acute systolic (congestive) heart failure: Secondary | ICD-10-CM | POA: Diagnosis not present

## 2022-06-04 DIAGNOSIS — K219 Gastro-esophageal reflux disease without esophagitis: Secondary | ICD-10-CM | POA: Diagnosis present

## 2022-06-04 DIAGNOSIS — E785 Hyperlipidemia, unspecified: Secondary | ICD-10-CM

## 2022-06-04 DIAGNOSIS — E1142 Type 2 diabetes mellitus with diabetic polyneuropathy: Secondary | ICD-10-CM | POA: Diagnosis present

## 2022-06-04 DIAGNOSIS — Z8550 Personal history of malignant neoplasm of unspecified urinary tract organ: Secondary | ICD-10-CM

## 2022-06-04 DIAGNOSIS — I5023 Acute on chronic systolic (congestive) heart failure: Secondary | ICD-10-CM | POA: Diagnosis present

## 2022-06-04 DIAGNOSIS — N182 Chronic kidney disease, stage 2 (mild): Secondary | ICD-10-CM | POA: Diagnosis present

## 2022-06-04 DIAGNOSIS — Z89432 Acquired absence of left foot: Secondary | ICD-10-CM | POA: Diagnosis not present

## 2022-06-04 DIAGNOSIS — I2510 Atherosclerotic heart disease of native coronary artery without angina pectoris: Secondary | ICD-10-CM | POA: Diagnosis present

## 2022-06-04 DIAGNOSIS — E782 Mixed hyperlipidemia: Secondary | ICD-10-CM | POA: Diagnosis present

## 2022-06-04 DIAGNOSIS — E11621 Type 2 diabetes mellitus with foot ulcer: Secondary | ICD-10-CM

## 2022-06-04 DIAGNOSIS — Z905 Acquired absence of kidney: Secondary | ICD-10-CM

## 2022-06-04 DIAGNOSIS — Z801 Family history of malignant neoplasm of trachea, bronchus and lung: Secondary | ICD-10-CM | POA: Diagnosis not present

## 2022-06-04 DIAGNOSIS — Z955 Presence of coronary angioplasty implant and graft: Secondary | ICD-10-CM

## 2022-06-04 DIAGNOSIS — E1151 Type 2 diabetes mellitus with diabetic peripheral angiopathy without gangrene: Secondary | ICD-10-CM | POA: Diagnosis present

## 2022-06-04 DIAGNOSIS — I5031 Acute diastolic (congestive) heart failure: Secondary | ICD-10-CM | POA: Diagnosis not present

## 2022-06-04 HISTORY — DX: Bacteremia: R78.81

## 2022-06-04 HISTORY — DX: Personal history of other diseases of the musculoskeletal system and connective tissue: Z87.39

## 2022-06-04 HISTORY — DX: Tobacco use: Z72.0

## 2022-06-04 HISTORY — DX: Personal history of other medical treatment: Z92.89

## 2022-06-04 HISTORY — DX: Osteomyelitis, unspecified: M86.9

## 2022-06-04 LAB — BASIC METABOLIC PANEL
Anion gap: 10 (ref 5–15)
BUN: 17 mg/dL (ref 8–23)
CO2: 23 mmol/L (ref 22–32)
Calcium: 9.2 mg/dL (ref 8.9–10.3)
Chloride: 103 mmol/L (ref 98–111)
Creatinine, Ser: 1.17 mg/dL (ref 0.61–1.24)
GFR, Estimated: >60 mL/min (ref >60)
Glucose, Bld: 119 mg/dL — ABNORMAL HIGH (ref 70–99)
Potassium: 4 mmol/L (ref 3.5–5.1)
Sodium: 136 mmol/L (ref 135–145)

## 2022-06-04 LAB — CBC WITH DIFFERENTIAL/PLATELET
Abs Immature Granulocytes: 0.02 10*3/uL (ref 0.00–0.07)
Basophils Absolute: 0 10*3/uL (ref 0.0–0.1)
Basophils Relative: 1 %
Eosinophils Absolute: 0.1 10*3/uL (ref 0.0–0.5)
Eosinophils Relative: 1 %
HCT: 32.1 % — ABNORMAL LOW (ref 39.0–52.0)
Hemoglobin: 10.4 g/dL — ABNORMAL LOW (ref 13.0–17.0)
Immature Granulocytes: 0 %
Lymphocytes Relative: 17 %
Lymphs Abs: 1 10*3/uL (ref 0.7–4.0)
MCH: 28 pg (ref 26.0–34.0)
MCHC: 32.4 g/dL (ref 30.0–36.0)
MCV: 86.5 fL (ref 80.0–100.0)
Monocytes Absolute: 0.5 10*3/uL (ref 0.1–1.0)
Monocytes Relative: 8 %
Neutro Abs: 4.3 10*3/uL (ref 1.7–7.7)
Neutrophils Relative %: 73 %
Platelets: 145 10*3/uL — ABNORMAL LOW (ref 150–400)
RBC: 3.71 MIL/uL — ABNORMAL LOW (ref 4.22–5.81)
RDW: 14 % (ref 11.5–15.5)
WBC: 5.9 10*3/uL (ref 4.0–10.5)
nRBC: 0 % (ref 0.0–0.2)

## 2022-06-04 LAB — CBG MONITORING, ED
Glucose-Capillary: 135 mg/dL — ABNORMAL HIGH (ref 70–99)
Glucose-Capillary: 251 mg/dL — ABNORMAL HIGH (ref 70–99)

## 2022-06-04 LAB — TROPONIN I (HIGH SENSITIVITY)
Troponin I (High Sensitivity): 809 ng/L (ref <18)
Troponin I (High Sensitivity): 817 ng/L (ref <18)
Troponin I (High Sensitivity): 654 ng/L (ref <18)
Troponin I (High Sensitivity): 549 ng/L (ref <18)

## 2022-06-04 LAB — HIV ANTIBODY (ROUTINE TESTING W REFLEX): HIV Screen 4th Generation wRfx: NONREACTIVE

## 2022-06-04 LAB — HEPARIN LEVEL (UNFRACTIONATED): Heparin Unfractionated: <0.1 IU/mL — ABNORMAL LOW (ref 0.30–0.70)

## 2022-06-04 LAB — MAGNESIUM: Magnesium: 1.6 mg/dL — ABNORMAL LOW (ref 1.7–2.4)

## 2022-06-04 LAB — D-DIMER, QUANTITATIVE: D-Dimer, Quant: 0.88 ug/mL-FEU — ABNORMAL HIGH (ref 0.00–0.50)

## 2022-06-04 MED ORDER — IOHEXOL 350 MG/ML SOLN
100.0000 mL | Freq: Once | INTRAVENOUS | Status: AC | PRN
  Administered 2022-06-04: 100 mL via INTRAVENOUS

## 2022-06-04 MED ORDER — MORPHINE SULFATE (PF) 2 MG/ML IV SOLN
1.0000 mg | INTRAVENOUS | Status: DC | PRN
  Administered 2022-06-04 – 2022-06-05 (×3): 1 mg via INTRAVENOUS
  Filled 2022-06-04 (×3): qty 1

## 2022-06-04 MED ORDER — ONDANSETRON HCL 4 MG/2ML IJ SOLN
4.0000 mg | Freq: Once | INTRAMUSCULAR | Status: DC
  Filled 2022-06-04: qty 2

## 2022-06-04 MED ORDER — HEPARIN (PORCINE) 25000 UT/250ML-% IV SOLN
1000.0000 [IU]/h | INTRAVENOUS | Status: DC
  Administered 2022-06-04: 1000 [IU]/h via INTRAVENOUS
  Filled 2022-06-04: qty 250

## 2022-06-04 MED ORDER — NITROGLYCERIN 2 % TD OINT: 1.0000 [in_us] | TOPICAL_OINTMENT | Freq: Once | TRANSDERMAL | Status: DC

## 2022-06-04 MED ORDER — SODIUM CHLORIDE 0.9% FLUSH
3.0000 mL | Freq: Two times a day (BID) | INTRAVENOUS | Status: DC
  Administered 2022-06-05 – 2022-06-07 (×3): 3 mL via INTRAVENOUS

## 2022-06-04 MED ORDER — FENTANYL 75 MCG/HR TD PT72: 1.0000 | MEDICATED_PATCH | TRANSDERMAL | Status: DC

## 2022-06-04 MED ORDER — FINASTERIDE 5 MG PO TABS
5.0000 mg | ORAL_TABLET | Freq: Every day | ORAL | Status: DC
  Administered 2022-06-04 – 2022-06-08 (×4): 5 mg via ORAL
  Filled 2022-06-04 (×4): qty 1

## 2022-06-04 MED ORDER — ONDANSETRON HCL 4 MG/2ML IJ SOLN
4.0000 mg | Freq: Four times a day (QID) | INTRAMUSCULAR | Status: DC | PRN
  Administered 2022-06-04: 4 mg via INTRAVENOUS
  Filled 2022-06-04: qty 2

## 2022-06-04 MED ORDER — PANTOPRAZOLE SODIUM 40 MG PO TBEC: 40.0000 mg | DELAYED_RELEASE_TABLET | Freq: Every day | ORAL | Status: DC | PRN

## 2022-06-04 MED ORDER — NITROGLYCERIN IN D5W 200-5 MCG/ML-% IV SOLN
0.0000 ug/min | INTRAVENOUS | Status: DC
  Administered 2022-06-04: 5 ug/min via INTRAVENOUS
  Administered 2022-06-05: 45 ug/min via INTRAVENOUS
  Filled 2022-06-04 (×2): qty 250

## 2022-06-04 MED ORDER — METOPROLOL TARTRATE 25 MG PO TABS
25.0000 mg | ORAL_TABLET | Freq: Two times a day (BID) | ORAL | Status: DC
  Administered 2022-06-04 – 2022-06-05 (×2): 25 mg via ORAL
  Filled 2022-06-04 (×3): qty 1

## 2022-06-04 MED ORDER — MORPHINE SULFATE (PF) 4 MG/ML IV SOLN: 4.0000 mg | Freq: Once | INTRAVENOUS | Status: DC

## 2022-06-04 MED ORDER — MAGNESIUM SULFATE 2 GM/50ML IV SOLN
2.0000 g | Freq: Once | INTRAVENOUS | Status: AC
Start: 2022-06-04 — End: 2022-06-04
  Administered 2022-06-04: 2 g via INTRAVENOUS
  Filled 2022-06-04: qty 50

## 2022-06-04 MED ORDER — ACETAMINOPHEN 325 MG PO TABS
650.0000 mg | ORAL_TABLET | ORAL | Status: DC | PRN
Start: 2022-06-04 — End: 2022-06-08
  Administered 2022-06-04: 650 mg via ORAL
  Filled 2022-06-04: qty 2

## 2022-06-04 MED ORDER — ASPIRIN 81 MG PO CHEW
324.0000 mg | CHEWABLE_TABLET | Freq: Once | ORAL | Status: AC
  Administered 2022-06-04: 324 mg via ORAL
  Filled 2022-06-04: qty 4

## 2022-06-04 MED ORDER — ASPIRIN 81 MG PO CHEW
81.0000 mg | CHEWABLE_TABLET | Freq: Every day | ORAL | Status: DC
Start: 2022-06-05 — End: 2022-06-06
  Administered 2022-06-05: 81 mg via ORAL
  Filled 2022-06-04: qty 1

## 2022-06-04 MED ORDER — INSULIN ASPART 100 UNIT/ML IJ SOLN
0.0000 [IU] | Freq: Three times a day (TID) | INTRAMUSCULAR | Status: DC
  Administered 2022-06-05 (×2): 3 [IU] via SUBCUTANEOUS

## 2022-06-04 MED ORDER — HYDROMORPHONE HCL 1 MG/ML IJ SOLN: 1.0000 mg | Freq: Once | INTRAMUSCULAR | Status: DC

## 2022-06-04 MED ORDER — HEPARIN BOLUS VIA INFUSION
4000.0000 [IU] | Freq: Once | INTRAVENOUS | Status: AC
  Administered 2022-06-04: 4000 [IU] via INTRAVENOUS
  Filled 2022-06-04: qty 4000

## 2022-06-04 MED ORDER — INSULIN DETEMIR 100 UNIT/ML ~~LOC~~ SOLN
5.0000 [IU] | Freq: Every day | SUBCUTANEOUS | Status: DC
Start: 2022-06-04 — End: 2022-06-06
  Administered 2022-06-04 – 2022-06-06 (×2): 5 [IU] via SUBCUTANEOUS
  Filled 2022-06-04 (×3): qty 0.05

## 2022-06-04 MED ORDER — NITROGLYCERIN 0.4 MG SL SUBL
0.4000 mg | SUBLINGUAL_TABLET | SUBLINGUAL | Status: DC | PRN
  Administered 2022-06-04 (×3): 0.4 mg via SUBLINGUAL
  Filled 2022-06-04: qty 1

## 2022-06-04 MED ORDER — ATORVASTATIN CALCIUM 80 MG PO TABS
80.0000 mg | ORAL_TABLET | Freq: Every day | ORAL | Status: DC
  Administered 2022-06-04 – 2022-06-08 (×4): 80 mg via ORAL
  Filled 2022-06-04 (×3): qty 1
  Filled 2022-06-04: qty 2

## 2022-06-04 NOTE — Assessment & Plan Note (Addendum)
61 year old presenting with 1 week history of progressively worsening chest pain with associated shortness of breath worse with exertion then even having at rest concerning for ACS. Risk factors include uncontrolled diabetes, age, HLD/HTN. No family history of CAD that he is aware of -admit to progressive -cardiology consulted with plans for [facility] tomorrow -troponin: 807>817 -continue heparin gtt -continue nitro gtt -echo  -given ASA and took ASA at home ('325mg'$  today), continue '81mg'$  tomorrow/daily  -already on atenolol daily, cards changed to lopressor '25mg'$  BID -continue lipitor '80mg'$  daily, repeat lipid panel in AM  -f/u on CTA chest for PE rule out. D-dimer mildly elevated at .88. LLE doppler negative for DVT  -has on fentanyl patch for pain, will need changed tomorrow, morphine PRN severe breakthrough pain  -NPO at midnight

## 2022-06-04 NOTE — ED Triage Notes (Signed)
BIB [facility] after pt  called to report increase chest wall pain. Per EMS, pt has this pain regularly but is worse today with inability to catch  breath.   HX: arthritis, bladder cancer, left kidney removal

## 2022-06-04 NOTE — ED Notes (Signed)
Patient transported to CT with RN 

## 2022-06-04 NOTE — Progress Notes (Signed)
ANTICOAGULATION CONSULT NOTE - Initial Consult  Pharmacy Consult for heparin Indication: chest pain/ACS  Allergies  Allergen Reactions   Prozac [Fluoxetine Hcl]     Headaches    Amlodipine Rash    Patient Measurements:   Heparin Dosing Weight: TBW  Vital Signs: Temp: 98.3 F (36.8 C) (08/16 1400) Temp Source: Oral (08/16 1400) BP: 135/88 (08/16 1645) Pulse Rate: 70 (08/16 1645)  Labs: Recent Labs    06/04/22 1517  HGB 10.4*  HCT 32.1*  PLT 145*  CREATININE 1.17  TROPONINIHS 809*    CrCl cannot be calculated (Unknown ideal weight.).   Medical History: Past Medical History:  Diagnosis Date   Allergy    Anemia    Arthritis    ARTHRITIS IN SPINE - MAKES PT HAVE CHEST PAIN- USES fENTANYL PATCH    Asthma    IN TEENS    Diabetes mellitus    toe amp 4/12 NOT ON MEDS IN 2 YEARS    Diabetic peripheral neuropathy associated with type 2 diabetes mellitus ([place]) 12/12/2011   Fatty liver disease, nonalcoholic 7/0/4888   GERD (gastroesophageal reflux disease)    Hyperlipidemia    Hypertension    Pneumonia    HX OF X 3    Urothelial cancer (HCC)     Assessment: 26 YOM presenting with CP, concern for ACS, he is not on anticoagulation PTA  Goal of Therapy:  Heparin level 0.3-0.7 units/ml Monitor platelets by anticoagulation protocol: Yes   Plan:  Heparin 4000 units IV x 1, and gtt at 1000 units/hr F/u 6 hour heparin level F/u cards eval and recs, also CT angio of chest  Bertis Ruddy, PharmD Clinical Pharmacist ED Pharmacist Phone # [phone] 06/04/2022 5:14 PM

## 2022-06-04 NOTE — Progress Notes (Signed)
LLE venous duplex has been completed.  Preliminary results given to Dr. Doren Custard.   Results can be found under chart review under CV PROC. 06/04/2022 5:53 PM Roxann Vierra RVT, RDMS

## 2022-06-04 NOTE — Consult Note (Addendum)
Cardiology Consult    Patient ID: Robert Burnett MRN: [id number], DOB/AGE: 62-Jun-1962   Admit date: 06/04/2022 Date of Consult: 06/04/2022  Primary Physician: [name], MD Primary Cardiologist: Sherren Mocha, MD Requesting Provider: R. Doren Custard, MD  Patient Profile    [name] is a 61 y.o. 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 Doren Custard.  Past Medical History   Past Medical History:  Diagnosis Date   Allergy    Anemia    Arthritis    ARTHRITIS IN SPINE - MAKES PT HAVE CHEST PAIN- USES fENTANYL PATCH    Asthma    IN TEENS    Bacteremia    a. 03/2022 Group B Strep bacteremia in setting of diabetic L foot infxn/gangrene/osteo.   Diabetes mellitus    Diabetic peripheral neuropathy associated with type 2 diabetes mellitus ([place]) 12/12/2011   Fatty liver disease, nonalcoholic 50/27/7412   Foot osteomyelitis, left ([place])    a. 2012 s/p toe amputations on L; b. 03/2022 - admitted w/ wet gangrene and necrotizing infxn w/ osteomyelitis-->s/p transmetatarsal followed by Lisfranc amputation.   GERD (gastroesophageal reflux disease)    H/O degenerative disc disease    History of echocardiogram    a. 03/2022 Echo: EF 55-60%, no rwma, nl RV fxn, mildly elev PASP. Mild MR.   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[facility])     Past Surgical History:  Procedure Laterality Date   ACHILLES TENDON SURGERY Left 03/28/2022   Procedure: ACHILLES LENGTHENING/KIDNER;  Surgeon: [name], DPM;  Location: [facility];  Service: Podiatry;  Laterality: Left;   AMPUTATION Left 03/25/2022   Procedure:  AMPUTATION DIGITS,TRANSMETARSAL AMPUTATION, REMOVAL OF TOES AND BONE BEHIND TOES ;  Surgeon: [name], DPM;  Location: [facility];  Service: Podiatry;  Laterality: Left;   APPLICATION OF WOUND VAC Left 03/25/2022   Procedure: APPLICATION OF WOUND VAC;  Surgeon: [name], DPM;  Location: [facility];  Service: Podiatry;  Laterality: Left;   CYSTOSCOPY/RETROGRADE/URETEROSCOPY Left 10/29/2020   Procedure: CYSTOSCOPY/RETROGRADE/ LEFT URETEROSCOPY WITH BIOPSY, LEFT URETERAL STENT;  Surgeon: Raynelle Bring, MD;  Location: [facility];  Service: Urology;  Laterality: Left;   IRRIGATION AND DEBRIDEMENT FOOT Left 03/28/2022   Procedure: AMPUTATION LISFRANC, SECONDARY WOUND CLOSURE;  Surgeon: [name], DPM;  Location: [facility];  Service: Podiatry;  Laterality: Left;   KNEE SURGERY Bilateral 1975 and 1989   MULTIPLE TOOTH EXTRACTIONS     ROBOT ASSITED LAPAROSCOPIC NEPHROURETERECTOMY Left 12/13/2020   Procedure: XI ROBOT ASSITED LAPAROSCOPIC NEPHROURETERECTOMY , POST OPERATIVE INTRAVESICAL GEMCITABINE;  Surgeon: Raynelle Bring, MD;  Location: [facility];  Service: Urology;  Laterality: Left;  ONLY NEEDS 240 MIN   TOEM AMPUTATION  Left    2012, second toe   TRANSURETHRAL RESECTION OF BLADDER TUMOR N/A 08/29/2021   Procedure: TRANSURETHRAL RESECTION OF BLADDER TUMOR (TURBT) WITH CYSTOSCOPY;  Surgeon: Raynelle Bring, MD;  Location: [facility];  Service: Urology;  Laterality: N/A;  GENERAL ANESTHESIA WITH PARALYSIS   WISDOM TOOTH EXTRACTION       Allergies  Allergies  Allergen Reactions  Amlodipine Rash   Prozac [Fluoxetine Hcl] Rash    History of Present Illness    61 y.o. 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  He has no prior cardiac history.  In  the setting of diabetic foot ulcer and left foot cyst, he underwent amputation of 2 toes in 2012.  Notes indicate that he has a long history of noncompliance with diabetes medications.  In June 2023, he was admitted to [facility] long with worsening left foot pain pain, swelling, and ulcerations.  He was diagnosed with wet gangrene and osteomyelitis as well as group B Streptococcus bacteremia.  He underwent transmetatarsal amputation of the left foot followed by Lisfranc amputation.  Echocardiogram was performed in the setting of bacteremia and showed an EF of 55 to 60% with normal RV function, mildly elevated PASP, and mild mitral regurgitation.  ABIs were normal bilaterally.  Of note, ECG on June 7 was performed in the setting of tachycardia noted on telemetry by nursing staff.  This did show atrial fibrillation at 122 bpm.  Follow-up ECG a day later showed sinus rhythm with PACs.  He was not initiated on any anticoagulation.  He was followed by infectious disease and discharged home on 7 days of amoxicillin.  Following discharge, he has been sedentary.  He notes that even prior to his amputation, he was relatively sedentary.  He has chronic pain in the setting of osteoarthritis that involves his back and chest/rib cage.  Over the past 1 to 2 weeks, he has started experiencing intermittent chest tightness, different from his typical osteoarthritic pain, associated with profound dyspnea, lasting several hours, and resolving spontaneously.  He had a particularly bad episode that occurred on August 13 and over the past 48 hours, he has had persistent low-level chest discomfort with intermittent worsening of dyspnea.  He had a very restless night last night and had trouble catching his breath, which prompted him to call EMS earlier this afternoon.  He was taken to the [facility] ED, where ECG shows anterolateral ST and T changes including T wave inversion in leads V2 and V3, similar to what was seen back in June.  Initial  troponin elevated at 809.  Labs notable for stable, anemia of chronic disease with a H&H of 10.4/32.1.  Patient was noted to have left greater than right lower extremity swelling in the setting of recent sedentary status.  Lower extremity duplex was performed and preliminarily does not show DVT.  Patient continues complain of 3/10 chest discomfort which he identifies as being different from his typical arthritic pain.  CT of the chest with contrast is pending to rule out PE.  Inpatient Medications     [START ON 06/05/2022] insulin aspart  0-15 Units Subcutaneous TID WC    morphine injection  4 mg Intravenous Once   ondansetron (ZOFRAN) IV  4 mg Intravenous Once    Family History    Family History  Problem Relation Age of Onset   Cancer Mother    Hypertension Father    Diabetes Father    Lung cancer Father    Diabetes Sister    Cancer Sister    He indicated that his mother is deceased. He indicated that his father is deceased. He indicated that his sister is deceased. He indicated that his  maternal grandmother is deceased. He indicated that his maternal grandfather is deceased. He indicated that his paternal grandmother is deceased. He indicated that his paternal grandfather is deceased.   Social History    Social History   Socioeconomic History   Marital status: Single    Spouse name: Not on file   Number of children: Not on file   Years of education: Not on file   Highest education level: Not on file  Occupational History   Not on file  Tobacco Use   Smoking status: Every Day    Packs/day: 2.00    Years: 5.00    Total pack years: 10.00    Types: Pipe, Cigarettes   Smokeless tobacco: Former    Types: Chew   Tobacco comments:    Using pipe until about a week ago (05/2022)  Vaping Use   Vaping Use: Never used  Substance and Sexual Activity   Alcohol use: No   Drug use: No   Sexual activity: Not on file  Other Topics Concern   Not on file  Social History Narrative    Lives in [place] by himself.  Sedentary in the setting of chronic arthritic pain impacting his chest/back along w/ L foot amputation.   Social Determinants of Health   Financial Resource Strain: Not on file  Food Insecurity: Not on file  Transportation Needs: Not on file  Physical Activity: Not on file  Stress: Not on file  Social Connections: Not on file  Intimate Partner Violence: Not on file     Review of Systems    General:  No chills, fever, night sweats or weight changes.  Cardiovascular:  +++ chest pain, +++ dyspnea on exertion, +++ L LE edema, no orthopnea, palpitations, paroxysmal nocturnal dyspnea. Dermatological: No rash, lesions/masses Respiratory: No cough, +++ dyspnea Urologic: No hematuria, dysuria Abdominal:   No nausea, vomiting, diarrhea, bright red blood per rectum, melena, or hematemesis Neurologic:  No visual changes, wkns, changes in mental status. All other systems reviewed and are otherwise negative except as noted above.  Physical Exam    Blood pressure (!) 143/93, pulse 68, temperature 98.3 F (36.8 C), temperature source Oral, resp. rate 14, SpO2 97 %.  General: Pleasant, NAD Psych: Normal affect. Neuro: Alert and oriented X 3. Moves all extremities spontaneously. HEENT: Normal  Neck: Supple without bruits or JVD. Lungs:  Resp regular and unlabored, CTA. Heart: RRR, distant, no s3, s4, or murmurs. Abdomen: Soft, non-tender, non-distended, BS + x 4.  Extremities: No clubbing, cyanosis.  1+ Left lower leg edema.  L transmetatarsal amputation.  DP/PT 1+, Radials 2+ and equal bilaterally.  Labs    Cardiac Enzymes Recent Labs  Lab 06/04/22 1517  TROPONINIHS 809*      Lab Results  Component Value Date   WBC 5.9 06/04/2022   HGB 10.4 (L) 06/04/2022   HCT 32.1 (L) 06/04/2022   MCV 86.5 06/04/2022   PLT 145 (L) 06/04/2022    Recent Labs  Lab 06/04/22 1517  NA 136  K 4.0  CL 103  CO2 23  BUN 17  CREATININE 1.17  CALCIUM 9.2  GLUCOSE  119*   Lab Results  Component Value Date   CHOL 182 06/01/2014   HDL 35 (L) 06/01/2014   LDLCALC 94 06/01/2014   TRIG 263 (H) 06/01/2014     Radiology Studies    VAS [place] LOWER EXTREMITY VENOUS (DVT) (7a-7p)  Result Date: 06/04/2022  Lower Venous DVT Study Patient Name:  JAQUANE BOUGHNER  Date of Exam:   06/04/2022 Medical Rec #: [id number]         Accession #:    [id number] Date of Birth: October 03, 1961          Patient Gender: M Patient Age:   42 years Exam Location:  [facility] Procedure:      VAS [place] LOWER EXTREMITY VENOUS (DVT) Referring Phys: Campbell Stall --------------------------------------------------------------------------------  Indications: Chest pain and LLE swelling.  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 Comparison Study: No previous exams Performing Technologist: Jody Hill RVT, RDMS  Examination Guidelines: A complete evaluation includes B-mode imaging, spectral Doppler, color Doppler, and power Doppler as needed of all accessible portions of each vessel. Bilateral testing is considered an integral part of a complete examination. Limited examinations for reoccurring indications may be performed as noted. The reflux portion of the exam is performed with the patient in reverse Trendelenburg.  +-----+---------------+---------+-----------+----------+--------------+ RIGHTCompressibilityPhasicitySpontaneityPropertiesThrombus Aging +-----+---------------+---------+-----------+----------+--------------+ CFV  Full           No       Yes                                 +-----+---------------+---------+-----------+----------+--------------+ Pulsatile doppler waveforms  +---------+---------------+---------+-----------+----------+-------------------+ LEFT     CompressibilityPhasicitySpontaneityPropertiesThrombus Aging      +---------+---------------+---------+-----------+----------+-------------------+ CFV      Full           No       Yes                                       +---------+---------------+---------+-----------+----------+-------------------+ SFJ      Full                                                             +---------+---------------+---------+-----------+----------+-------------------+ FV Prox  Full           No       Yes                                      +---------+---------------+---------+-----------+----------+-------------------+ FV Mid   Full           No       Yes                                      +---------+---------------+---------+-----------+----------+-------------------+ FV DistalFull           No       Yes                                      +---------+---------------+---------+-----------+----------+-------------------+ PFV      Full                                                             +---------+---------------+---------+-----------+----------+-------------------+  POP      Full           No       Yes                                      +---------+---------------+---------+-----------+----------+-------------------+ PTV      Full                                                             +---------+---------------+---------+-----------+----------+-------------------+ PERO                                                  Not well visualized +---------+---------------+---------+-----------+----------+-------------------+    Summary: RIGHT: - No evidence of common femoral vein obstruction.  LEFT: - There is no evidence of deep vein thrombosis in the lower extremity.  - No cystic structure found in the popliteal fossa. - Ultrasound characteristics of enlarged lymph nodes noted in the groin. Subcutaneous edema of calf and ankle.  *See table(s) above for measurements and observations.    Preliminary    DG Chest Portable 1 View  Result Date: 06/04/2022 CLINICAL DATA:  Chest pain EXAM: PORTABLE CHEST 1 VIEW COMPARISON:  03/25/2022 FINDINGS: Mild cardiomegaly. Mild,  diffuse bilateral interstitial pulmonary opacity. The visualized skeletal structures are unremarkable. IMPRESSION: Mild cardiomegaly with mild, diffuse bilateral interstitial pulmonary opacity, likely edema. No focal airspace opacity. Electronically Signed   By: Delanna Ahmadi M.D.   On: 06/04/2022 15:11    ECG & Cardiac Imaging    RSR, 70, mild lateral ST depression, TWI V2 - personally reviewed.  Assessment & Plan    1. NSTEMI:  Pt w/o prior cardiac history presented to the ED today w/ a 1 wk h/o progressive rest and exertional sscp and dyspnea, often lasting several hours.  Following a more severe episode of c/p on 8/13, he has had relatively low level but constant chest pain with episodic worsening associated with profound dyspnea.  He was restless throughout the day/evening yesterday and this AM, prompting ED presentation today.  Here, ECG notable for lateral ST depression and anterior TWI, similar to what was seen in 03/2022.  HsTrop elevated @ 809.  In setting of sedentary state and ongoing left lower extremity swelling, a lower ext u/s was performed and is preliminarily negative.  CTA chest pending.  Presentation appears to be most consistent w/ ACS/NSTEMI.  We will add heparin/ntg/asa/? blocker/statin rx.  Options for mgmt discussed w/ plan for cath in AM.  The patient understands that risks include but are not limited to stroke (1 in 1000), death (1 in 1000), kidney failure [usually temporary] (1 in 500), bleeding (1 in 200), allergic reaction [possibly serious] (1 in 200), and agrees to proceed.    2.  Essential HTN: Stable on ? blocker @ home.  3.  HL:  Will add lipitor 80.  4.  DMII:  Per IM.    5.  Tob abuse:  Was smoking a pipe most recently.  Quit about a week ago.  Cessation advised.  6.  Chronic pain/osteoarthritis:  Severe per  pt.  Uses fentanyl patches.  Per IM.  7.  H/o osteomyelitis s/p L foot transmetatarsal amputation:  In 03/2022.  Sedentary since.  LLE U/S neg for DVT per  prelim report.  Prev nl ABIs.  No recent fevers/chills/ulcerations.  8.  PAF:  pt w documented Afib during hospitalization in June (6/7 ECG).  Duration unclear though f/u ECG a day later showed sinus rhythm.  Not prev on [place].  CHA2DS2VASc = 2.  Follow tele.  Would plan to add DOAC if he has recurrent afib.  Risk Assessment/Risk Scores:     TIMI Risk Score for Unstable Angina or Non-ST Elevation MI:   The patient's TIMI risk score is 4, which indicates a 20% risk of all cause mortality, new or recurrent myocardial infarction or need for urgent revascularization in the next 14 days.      Signed, Murray Hodgkins, NP 06/04/2022, 6:30 PM  For questions or updates, please contact   Please consult [email or web address] for contact info under Cardiology/STEMI.   Patient seen, examined. Available data reviewed. Agree with findings, assessment, and plan as outlined by Ignacia Bayley, NP.  The patient is alert, oriented, in no acute distress.  HEENT is normal, carotid upstrokes are normal without bruits, JVP is normal, lungs are clear bilaterally, heart is regular rate and rhythm with no murmur gallop, abdomen is soft and nontender, extremities show 1+ left lower leg edema with left transmetatarsal amputation.  No significant edema on the right.  Skin is otherwise warm and dry with no rash.  Neurologic is grossly intact.  The patient's EKG shows sinus rhythm with ST and T wave abnormality consider anterior ischemia.  High-sensitivity troponin is elevated with a value of 809.  Renal function is normal with a creatinine of 1.17.  The patient has chronic anemia with hemoglobin of 10.4 which is actually increased from his baseline recent labs.  D-dimer is mildly elevated at 0.88.  The patient's symptoms seem suggestive of ACS/non-STEMI.  He also is at high risk of PE and I think it is appropriate to perform a CTA study to evaluate for pulmonary embolus as already ordered by the ED physician.  He will be treated with IV  heparin and nitroglycerin until pain-free.  He currently reports mild chest discomfort that he rates at 3/10 with his most severe episode occurring about 3 days ago.  As long as his CT scan does not demonstrate any evidence of pulmonary embolus as etiology of his symptoms, he should undergo cardiac catheterization with possible PCI.  We will put him on the schedule for tomorrow but he understands if his symptoms progress he may need to be done more urgently. I have reviewed the risks, indications, and alternatives to cardiac catheterization, possible angioplasty, and stenting with the patient. Risks include but are not limited to bleeding, infection, vascular injury, stroke, myocardial infection, arrhythmia, kidney injury, radiation-related injury in the case of prolonged fluoroscopy use, emergency cardiac surgery, and death. The patient understands the risks of serious complication is 1-2 in 8119 with diagnostic cardiac cath and 1-2% or less with angioplasty/stenting.  The patient's diabetes and chronic pain will be managed by the hospitalist team.  We will follow throughout his hospital course.  Otherwise as outlined above.  Sherren Mocha, M.D. 06/04/2022 6:47 PM

## 2022-06-04 NOTE — Assessment & Plan Note (Signed)
repleted in ED, follow labs tomorrow

## 2022-06-04 NOTE — Assessment & Plan Note (Addendum)
Continue lipitor '80mg'$  daily

## 2022-06-04 NOTE — Assessment & Plan Note (Addendum)
Decently controlled Continue atenolol '100mg'$  daily-cardiology changed to lopressor '25mg'$  BID  On NG gtt, watch pressures

## 2022-06-04 NOTE — Assessment & Plan Note (Addendum)
A1C of 14.2 in 03/2022 Hold his 70/30 insulin dosing with NPO at midnight Start levemir 10 units daily (will give only 5 tonight since NPO at midnight) Sliding scale insulin and accuchecks qac/hs

## 2022-06-04 NOTE — Assessment & Plan Note (Addendum)
History of transmetatarsal amputation of his left toes in 03/2022 New wound on bottom of his foot that he noticed maybe a week ago Check foot xray, no erythema, drainage or evidence of infection at this point Cbc with no leukocytosis ABI normal in 03/2022 Wound care consult  DVT doppler negative for DVT of LLE

## 2022-06-04 NOTE — H&P (Signed)
History and Physical    Patient: Robert Burnett GEX:[id number] DOB: 08-26-1961 DOA: 06/04/2022 DOS: the patient was seen and examined on 06/04/2022 PCP: [name], MD  Patient coming from: Home - lives with his nephew.    Chief Complaint: chest pain and shortness of breath   HPI: Robert Burnett is a 61 y.o. male with medical history significant of HTN, T2DM, fatty liver, HLD, hx of urothelial cancer, diabetic neuropathy who presented to ED with complaints of chest pain and shortness of breath x 1 week.  Chest pain was initially intermittent, but then Sunday became more constant with associated shortness of breath. Sunday the chest pain became more intense that he started to shake and he could hardly walk. He states this eventually got better, but came back. Today he states the pain got bad and he had associated shortness of breath that made him come to the hospital. Pain was so bad he could not sleep or breath last night. Pain is substernal and rated as an 8-9/10. He feels like an elephant is on his chest. He feels like he has been beat with a sledge hammer to his chest. He denies any associated diaphoresis or N/V. He states the chest pain is worse with exertion.    Denies any fever/chills, vision changes/headaches, cough, abdominal pain, N/V/D, dysuria. He has noticed that his left lower leg has been swollen over the last week. He denies any pain in this leg. He leads a sedentary lifestyle due to his chronic pain from his arthritis.   He was admitted in 03/2022 for diabetic foot infection with wet gangrene and necrotizing infection and osteomyelitis. S/p transmetatarsal amputation of the left foot with I&D by podiatry on 03/25/22 then OR again on 6/9 with Lisfranc amputation. ABI normal.   He smokes a pipe and does not drink alcohol. No family history of heart disease.   ER Course:  vitals: afebrile, bp: 122/77, HR: 72, RR: 26, oxygen: 97%RA Pertinent labs: hgb: 10.4, troponin 809, mag: 1.6,   CXR: mild cardiomegaly with mild, diffuse bilateral interstitial pulmonary opacity, likely edema. No focal airspace disease DVT doppler: prelim read no DVT in LLE.  In ED: given ASA, started on heparin gtt and NG gtt. Magnesium repleted. Cardiology consulted. TR[facility] asked to admit.      Review of Systems: As mentioned in the history of present illness. All other systems reviewed and are negative. Past Medical History:  Diagnosis Date   Allergy    Anemia    Arthritis    ARTHRITIS IN SPINE - MAKES PT HAVE CHEST PAIN- USES fENTANYL PATCH    Asthma    IN TEENS    Bacteremia    a. 03/2022 Group B Strep bacteremia in setting of diabetic L foot infxn/gangrene/osteo.   Diabetes mellitus    Diabetic peripheral neuropathy associated with type 2 diabetes mellitus ([place]) 12/12/2011   Fatty liver disease, nonalcoholic 10/22/7251   Foot osteomyelitis, left ([place])    a. 2012 s/p toe amputations on L; b. 03/2022 - admitted w/ wet gangrene and necrotizing infxn w/ osteomyelitis-->s/p transmetatarsal followed by Lisfranc amputation.   GERD (gastroesophageal reflux disease)    H/O degenerative disc disease    History of echocardiogram    a. 03/2022 Echo: EF 55-60%, no rwma, nl RV fxn, mildly elev PASP. Mild MR.   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([place])  Past Surgical History:  Procedure Laterality Date   ACHILLES TENDON SURGERY Left 03/28/2022   Procedure: ACHILLES LENGTHENING/KIDNER;  Surgeon: [name], DPM;  Location: [facility];  Service: Podiatry;  Laterality: Left;   AMPUTATION Left 03/25/2022   Procedure: AMPUTATION DIGITS,TRANSMETARSAL AMPUTATION, REMOVAL OF TOES AND BONE BEHIND TOES ;  Surgeon: [name], DPM;  Location: [facility];  Service: Podiatry;  Laterality: Left;   APPLICATION OF WOUND VAC Left 03/25/2022   Procedure: APPLICATION OF WOUND VAC;  Surgeon: [name], DPM;  Location: [facility];  Service: Podiatry;   Laterality: Left;   CYSTOSCOPY/RETROGRADE/URETEROSCOPY Left 10/29/2020   Procedure: CYSTOSCOPY/RETROGRADE/ LEFT URETEROSCOPY WITH BIOPSY, LEFT URETERAL STENT;  Surgeon: Raynelle Bring, MD;  Location: [facility];  Service: Urology;  Laterality: Left;   IRRIGATION AND DEBRIDEMENT FOOT Left 03/28/2022   Procedure: AMPUTATION LISFRANC, SECONDARY WOUND CLOSURE;  Surgeon: [name], DPM;  Location: [facility];  Service: Podiatry;  Laterality: Left;   KNEE SURGERY Bilateral 1975 and 1989   MULTIPLE TOOTH EXTRACTIONS     ROBOT ASSITED LAPAROSCOPIC NEPHROURETERECTOMY Left 12/13/2020   Procedure: XI ROBOT ASSITED LAPAROSCOPIC NEPHROURETERECTOMY , POST OPERATIVE INTRAVESICAL GEMCITABINE;  Surgeon: Raynelle Bring, MD;  Location: [facility];  Service: Urology;  Laterality: Left;  ONLY NEEDS 240 MIN   TOEM AMPUTATION  Left    2012, second toe   TRANSURETHRAL RESECTION OF BLADDER TUMOR N/A 08/29/2021   Procedure: TRANSURETHRAL RESECTION OF BLADDER TUMOR (TURBT) WITH CYSTOSCOPY;  Surgeon: Raynelle Bring, MD;  Location: [facility];  Service: Urology;  Laterality: N/A;  GENERAL ANESTHESIA WITH PARALYSIS   WISDOM TOOTH EXTRACTION     Social History:  reports that he has been smoking pipe and cigarettes. He has a 10.00 pack-year smoking history. He has quit using smokeless tobacco.  His smokeless tobacco use included chew. He reports that he does not drink alcohol and does not use drugs.  Allergies  Allergen Reactions   Amlodipine Rash   Prozac [Fluoxetine Hcl] Rash    Family History  Problem Relation Age of Onset   Cancer Mother    Hypertension Father    Diabetes Father    Lung cancer Father    Diabetes Sister    Cancer Sister     Prior to Admission medications   Medication Sig Start Date End Date Taking? Authorizing Provider  amoxicillin (AMOXIL) 500 MG capsule Take 2 capsules (1,000 mg total) by mouth 3 (three) times daily. 04/04/22   Georgette Shell, MD  atenolol (TENORMIN) 100 MG tablet Take 0.5  tablets (50 mg total) by mouth daily. Patient taking differently: Take 100 mg by mouth daily. 01/06/19   Wendie Agreste, MD  atorvastatin (LIPITOR) 80 MG tablet Take 80 mg by mouth daily. 04/25/21   [provider]  B Complex-C (B-COMPLEX WITH VITAMIN C) tablet Take 1 tablet by mouth daily. 04/04/22   Georgette Shell, MD  fentaNYL (DURAGESIC) 75 MCG/HR Place 1 patch onto the skin every 3 (three) days. 04/05/22   Georgette Shell, MD  finasteride (PROSCAR) 5 MG tablet Take 5 mg by mouth daily.    [provider]  FREESTYLE LITE test strip 1 each by Other route 3 (three) times daily. 04/30/22   Charlott Rakes, MD  insulin isophane & regular human KwikPen (NOVOLIN 70/30 KWIKPEN) (70-30) 100 UNIT/ML KwikPen Inject 28 Units into the skin 2 (two) times daily. Patient taking differently: Inject 30 Units into the skin 2 (two) times daily. 04/04/22   Landis Gandy  G, MD  Insulin Pen Needle (PEN NEEDLES 3/16") 31G X 5 MM MISC 28 Units by Does not apply route 2 (two) times daily. 04/04/22   Georgette Shell, MD  Lancets (FREESTYLE) lancets 1 each by Other route 3 (three) times daily. 04/30/22   Charlott Rakes, MD  pantoprazole (PROTONIX) 40 MG tablet Take 1 tablet (40 mg total) by mouth daily. Patient taking differently: Take 40 mg by mouth daily as needed (acid reflux/indigestion). 01/06/19   Wendie Agreste, MD    Physical Exam: Vitals:   06/04/22 1645 06/04/22 1700 06/04/22 1845 06/04/22 1915  BP: 135/88 (!) 143/93 126/79 126/79  Pulse: 70 68 65 65  Resp: '20 14 15 19  '$ Temp:   98.3 F (36.8 C)   TempSrc:   Oral   SpO2: 98% 97% 98% 97%   General:  Appears calm and comfortable and is in NAD Eyes:  PERRL, EOMI, normal lids, iris ENT:  grossly normal hearing, lips & tongue, mmm; appropriate dentition Neck:  no LAD, masses or thyromegaly; no carotid bruits Cardiovascular:  RRR, no m/r/g. LLE edema to below the knee  Respiratory:   CTA bilaterally with no  wheezes/rales/rhonchi.  Normal respiratory effort. Abdomen:  soft, NT, ND, NABS Back:   normal alignment, no CVAT Skin:  no rash or induration seen on limited exam Musculoskeletal:  grossly normal tone BUE/BLE, good ROM, no bony abnormality Lower extremity:  LLE edema to below the knee.  Left foot with transmetatarsal amputation and open wound on bottom of foot. No surrounding erythema, drainage.    Psychiatric:  grossly normal mood and affect, speech fluent and appropriate, AOx3 Neurologic:  CN 2-12 grossly intact, moves all extremities in coordinated fashion, sensation intact   Radiological Exams on Admission: Independently reviewed - see discussion in A/P where applicable  VAS [place] LOWER EXTREMITY VENOUS (DVT) (7a-7p)  Result Date: 06/04/2022  Lower Venous DVT Study Patient Name:  Robert Burnett  Date of Exam:   06/04/2022 Medical Rec #: [id number]         Accession #:    [id number] Date of Birth: 11/12/1960          Patient Gender: M Patient Age:   37 years Exam Location:  [facility] Procedure:      VAS [place] LOWER EXTREMITY VENOUS (DVT) Referring Phys: Campbell Stall --------------------------------------------------------------------------------  Indications: Chest pain and LLE swelling.  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 Comparison Study: No previous exams Performing Technologist: Jody Hill RVT, RDMS  Examination Guidelines: A complete evaluation includes B-mode imaging, spectral Doppler, color Doppler, and power Doppler as needed of all accessible portions of each vessel. Bilateral testing is considered an integral part of a complete examination. Limited examinations for reoccurring indications may be performed as noted. The reflux portion of the exam is performed with the patient in reverse Trendelenburg.  +-----+---------------+---------+-----------+----------+--------------+ RIGHTCompressibilityPhasicitySpontaneityPropertiesThrombus Aging  +-----+---------------+---------+-----------+----------+--------------+ CFV  Full           No       Yes                                 +-----+---------------+---------+-----------+----------+--------------+ Pulsatile doppler waveforms  +---------+---------------+---------+-----------+----------+-------------------+ LEFT     CompressibilityPhasicitySpontaneityPropertiesThrombus Aging      +---------+---------------+---------+-----------+----------+-------------------+ CFV      Full           No       Yes                                      +---------+---------------+---------+-----------+----------+-------------------+  SFJ      Full                                                             +---------+---------------+---------+-----------+----------+-------------------+ FV Prox  Full           No       Yes                                      +---------+---------------+---------+-----------+----------+-------------------+ FV Mid   Full           No       Yes                                      +---------+---------------+---------+-----------+----------+-------------------+ FV DistalFull           No       Yes                                      +---------+---------------+---------+-----------+----------+-------------------+ PFV      Full                                                             +---------+---------------+---------+-----------+----------+-------------------+ POP      Full           No       Yes                                      +---------+---------------+---------+-----------+----------+-------------------+ PTV      Full                                                             +---------+---------------+---------+-----------+----------+-------------------+ PERO                                                  Not well visualized +---------+---------------+---------+-----------+----------+-------------------+     Summary: RIGHT: - No evidence of common femoral vein obstruction.  LEFT: - There is no evidence of deep vein thrombosis in the lower extremity.  - No cystic structure found in the popliteal fossa. - Ultrasound characteristics of enlarged lymph nodes noted in the groin. Subcutaneous edema of calf and ankle.  *See table(s) above for measurements and observations.    Preliminary    DG Chest Portable 1 View  Result Date: 06/04/2022 CLINICAL DATA:  Chest pain EXAM: PORTABLE CHEST 1 VIEW COMPARISON:  03/25/2022 FINDINGS: Mild cardiomegaly. Mild, diffuse bilateral interstitial  pulmonary opacity. The visualized skeletal structures are unremarkable. IMPRESSION: Mild cardiomegaly with mild, diffuse bilateral interstitial pulmonary opacity, likely edema. No focal airspace opacity. Electronically Signed   By: Delanna Ahmadi M.D.   On: 06/04/2022 15:11    EKG: Independently reviewed.  NSR with rate 68; nonspecific ST changes with no evidence of acute ischemia. T wave inversion in V2/v3    Labs on Admission: I have personally reviewed the available labs and imaging studies at the time of the admission.  Pertinent labs:   Troponin 034>742 Mag: 1.6   Assessment and Plan: Principal Problem:   NSTEMI (non-ST elevated myocardial infarction) ([place]) Active Problems:   Hypomagnesemia   Diabetic ulcer of left foot (HCC)   Type 2 diabetes mellitus with complication, with long-term current use of insulin (HCC)   Hypertension   Hyperlipidemia    Assessment and Plan: * NSTEMI (non-ST elevated myocardial infarction) ([place]) 61 year old presenting with 1 week history of progressively worsening chest pain with associated shortness of breath worse with exertion then even having at rest concerning for ACS. Risk factors include uncontrolled diabetes, age, HLD/HTN. No family history of CAD that he is aware of -admit to progressive -cardiology consulted with plans for [facility] tomorrow -troponin: 807>817 -continue heparin  gtt -continue nitro gtt -echo  -given ASA and took ASA at home ('325mg'$  today), continue '81mg'$  tomorrow/daily  -already on atenolol daily, cards changed to lopressor '25mg'$  BID -continue lipitor '80mg'$  daily, repeat lipid panel in AM  -f/u on CTA chest for PE rule out. D-dimer mildly elevated at .88. 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 in 03/2022 New wound on bottom of his foot that he noticed maybe a week ago Check foot xray, no erythema, drainage or evidence of infection at this point Cbc with no leukocytosis ABI normal in 03/2022 Wound care consult  DVT doppler negative for DVT of LLE    Type 2 diabetes mellitus with complication, with long-term current use of insulin (HCC) A1C of 14.2 in 03/2022 Hold his 70/30 insulin dosing with NPO at midnight Start levemir 10 units daily (will give only 5 tonight since NPO at midnight) Sliding scale insulin and accuchecks qac/hs   Hypertension Decently controlled Continue atenolol '100mg'$  daily-cardiology changed to lopressor '25mg'$  BID  On NG gtt, watch pressures   Hyperlipidemia Continue lipitor '80mg'$  daily     Advance Care Planning:   Code Status: Full Code   Consults: cardiology   DVT Prophylaxis: heparin gtt   Family Communication: updated Cherice May by phone (cousin) [phone]  Severity of Illness: The appropriate patient status for this patient is INPATIENT. Inpatient status is judged to be reasonable and necessary in order to provide the required intensity of service to ensure the patient's safety. The patient's presenting symptoms, physical exam findings, and initial radiographic and laboratory data in the context of their chronic comorbidities is felt to place them at high risk  for further clinical deterioration. Furthermore, it is not anticipated that the patient will be medically stable for discharge from the hospital within 2 midnights of admission.   * I certify that at the point of admission it is my clinical judgment that the patient will require inpatient hospital care spanning beyond 2 midnights from the point  of admission due to high intensity of service, high risk for further deterioration and high frequency of surveillance required.*  Author: Orma Flaming, MD 06/04/2022 7:36 PM  For on call review [email or web address].

## 2022-06-04 NOTE — ED Provider Notes (Addendum)
[facility] EMERGENCY DEPARTMENT Provider Note   CSN: [id number] Arrival date & time: 06/04/22  1354     History  Chief Complaint  Patient presents with  . Chest Pain    Robert Burnett is a 61 y.o. male.  Pt is a 61 yo male presenting for chest pain. Patient admits to severe ;sternal chest pain with associated sob that started three days ago, described as intermittent, at rest, making it difficult to sleep due to difficulty in breathing. Admits to baseline back arthritis and pain that radiates to the chest but never this severe or associated with sob. No fevers, chills, or coughing. Smokes a pipe. Hx of HTN, DM, hyperlipidemia. Compliant with home meds. Pt does report unilateral leg swelling in left leg x 1 week. No prior hx of DVT/Pe's. No recent falls/injuries. Not on anticoagulation.   The history is provided by the patient. No language interpreter was used.  Chest Pain Associated symptoms: shortness of breath   Associated symptoms: no abdominal pain, no back pain, no cough, no fever, no palpitations and no vomiting        Home Medications Prior to Admission medications   Medication Sig Start Date End Date Taking? Authorizing Provider  amoxicillin (AMOXIL) 500 MG capsule Take 2 capsules (1,000 mg total) by mouth 3 (three) times daily. 04/04/22   Georgette Shell, MD  atenolol (TENORMIN) 100 MG tablet Take 0.5 tablets (50 mg total) by mouth daily. Patient taking differently: Take 100 mg by mouth daily. 01/06/19   Wendie Agreste, MD  atorvastatin (LIPITOR) 80 MG tablet Take 80 mg by mouth daily. 04/25/21   [provider]  B Complex-C (B-COMPLEX WITH VITAMIN C) tablet Take 1 tablet by mouth daily. 04/04/22   Georgette Shell, MD  fentaNYL (DURAGESIC) 75 MCG/HR Place 1 patch onto the skin every 3 (three) days. 04/05/22   Georgette Shell, MD  finasteride (PROSCAR) 5 MG tablet Take 5 mg by mouth daily.    [provider]  FREESTYLE  LITE test strip 1 each by Other route 3 (three) times daily. 04/30/22   Charlott Rakes, MD  insulin isophane & regular human KwikPen (NOVOLIN 70/30 KWIKPEN) (70-30) 100 UNIT/ML KwikPen Inject 28 Units into the skin 2 (two) times daily. Patient taking differently: Inject 30 Units into the skin 2 (two) times daily. 04/04/22   Georgette Shell, MD  Insulin Pen Needle (PEN NEEDLES 3/16") 31G X 5 MM MISC 28 Units by Does not apply route 2 (two) times daily. 04/04/22   Georgette Shell, MD  Lancets (FREESTYLE) lancets 1 each by Other route 3 (three) times daily. 04/30/22   Charlott Rakes, MD  pantoprazole (PROTONIX) 40 MG tablet Take 1 tablet (40 mg total) by mouth daily. Patient taking differently: Take 40 mg by mouth daily as needed (acid reflux/indigestion). 01/06/19   Wendie Agreste, MD      Allergies    Prozac [fluoxetine hcl] and Amlodipine    Review of Systems   Review of Systems  Constitutional:  Negative for chills and fever.  HENT:  Negative for ear pain and sore throat.   Eyes:  Negative for pain and visual disturbance.  Respiratory:  Positive for shortness of breath. Negative for cough.   Cardiovascular:  Positive for chest pain. Negative for palpitations.  Gastrointestinal:  Negative for abdominal pain and vomiting.  Genitourinary:  Negative for dysuria and hematuria.  Musculoskeletal:  Negative for arthralgias and back pain.  Skin:  Negative for color change and rash.  Neurological:  Negative for seizures and syncope.  All other systems reviewed and are negative.   Physical Exam Updated Vital Signs BP 130/84   Pulse 73   Temp 98.3 F (36.8 C) (Oral)   Resp 14   SpO2 95%  Physical Exam Vitals and nursing note reviewed.  Constitutional:      General: He is not in acute distress.    Appearance: He is well-developed.  HENT:     Head: Normocephalic and atraumatic.  Eyes:     Conjunctiva/sclera: Conjunctivae normal.  Cardiovascular:     Rate and Rhythm: Normal  rate and regular rhythm.     Heart sounds: No murmur heard. Pulmonary:     Effort: Pulmonary effort is normal. No respiratory distress.     Breath sounds: Normal breath sounds.  Abdominal:     Palpations: Abdomen is soft.     Tenderness: There is no abdominal tenderness.  Musculoskeletal:        General: No swelling.     Cervical back: Neck supple.     Right lower leg: No edema.     Left lower leg: 2+ Edema present.       Feet:  Skin:    General: Skin is warm and dry.     Capillary Refill: Capillary refill takes less than 2 seconds.  Neurological:     Mental Status: He is alert.  Psychiatric:        Mood and Affect: Mood normal.     ED Results / Procedures / Treatments   Labs (all labs ordered are listed, but only abnormal results are displayed) Labs Reviewed  CBC WITH DIFFERENTIAL/PLATELET - Abnormal; Notable for the following components:      Result Value   RBC 3.71 (*)    Hemoglobin 10.4 (*)    HCT 32.1 (*)    Platelets 145 (*)    All other components within normal limits  BASIC METABOLIC PANEL - Abnormal; Notable for the following components:   Glucose, Bld 119 (*)    All other components within normal limits  MAGNESIUM - Abnormal; Notable for the following components:   Magnesium 1.6 (*)    All other components within normal limits  TROPONIN I (HIGH SENSITIVITY) - Abnormal; Notable for the following components:   Troponin I (High Sensitivity) 809 (*)    All other components within normal limits  D-DIMER, QUANTITATIVE  TROPONIN I (HIGH SENSITIVITY)    EKG None  Radiology DG Chest Portable 1 View  Result Date: 06/04/2022 CLINICAL DATA:  Chest pain EXAM: PORTABLE CHEST 1 VIEW COMPARISON:  03/25/2022 FINDINGS: Mild cardiomegaly. Mild, diffuse bilateral interstitial pulmonary opacity. The visualized skeletal structures are unremarkable. IMPRESSION: Mild cardiomegaly with mild, diffuse bilateral interstitial pulmonary opacity, likely edema. No focal airspace  opacity. Electronically Signed   By: Delanna Ahmadi M.D.   On: 06/04/2022 15:11    Procedures .Critical Care  Performed by: Lianne Cure, DO Authorized by: Lianne Cure, DO   Critical care provider statement:    Critical care time (minutes):  74   Critical care was necessary to treat or prevent imminent or life-threatening deterioration of the following conditions: NTEMI, heparin started.   Critical care was time spent personally by me on the following activities:  Development of treatment plan with patient or surrogate, discussions with consultants, evaluation of patient's response to treatment, examination of patient, ordering and review of laboratory studies, ordering and review of radiographic studies, ordering  and performing treatments and interventions, pulse oximetry, re-evaluation of patient's condition and review of old charts     Medications Ordered in ED Medications  morphine (PF) 4 MG/ML injection 4 mg (has no administration in time range)  ondansetron (ZOFRAN) injection 4 mg (has no administration in time range)  nitroGLYCERIN (NITROSTAT) SL tablet 0.4 mg (has no administration in time range)  aspirin chewable tablet 324 mg (has no administration in time range)    ED Course/ Medical Decision Making/ A&P Clinical Course as of 06/04/22 1655  Wed Jun 04, 2022  1653 EKG demo no stemi. Initial trop 809. ASA ordered. Heparin ordered for ACS-likely NSTEMI. Cards consult placed. Pt does endorse unilateral left leg swelling x 1 week and significant sob. No hypoxia. Well appearing on exam. Will rule out PE. Will require heparin dose changes if PE.  [AG]    Clinical Course User Index [AG] Lianne Cure, DO                           Medical Decision Making Amount and/or Complexity of Data Reviewed Labs: ordered. Radiology: ordered.  Risk OTC drugs. Prescription drug management.   61 yo male presenting for chest pain. Patient admits to severe ;sternal chest pain with  associated sob that started three days ago, described as intermittent, at rest, making it difficult to sleep due to difficulty in breathing. Pt is Aox3, no acute distress, afebrile, stable vitals. Physical exam demonstrates a well appearing male, moderate pain, no diaphoresis, speaking in full sentences without conversational dyspnea.  I independently interpreted patient's labs and EKG. EKG is stable without ST segment elevation or depression. T wave changes concerning for inversion verses biphasic T waves. CXR demonstrates no pneumothorax, hemothorax, or pneumonia. Troponin pending. Patient has left sided leg swelling x 1 week. [place] pending. Consider PE workup.   Initial troponin 809. Nitro given. Concerns for NSTEMI. ASA given. Heparin ordered for ACS will need dose change if positive for PE.  Patient signed out to oncoming provider while awaiting imaging to rule out pulmonary embolism.      Final Clinical Impression(s) / ED Diagnoses Final diagnoses:  Chest pain, unspecified type  SOB (shortness of breath)  Left leg swelling    Rx / DC Orders ED Discharge Orders     None         Lianne Cure, DO 38/45/36 [id number]    Campbell Stall P, DO 32/12/24 8250    Lianne Cure, DO 03/70/48 1647

## 2022-06-04 NOTE — ED Provider Notes (Signed)
Patient presented for chest pain and shortness of breath.  EKG shows acute on chronic T wave abnormalities in anterior leads.  Initial troponin is elevated 809.  Aspirin and heparin have been ordered.  Currently awaiting callback from cardiology. Physical Exam  BP 113/68   Pulse 70   Temp 98.7 F (37.1 C) (Oral)   Resp 19   Ht 6' (1.829 m)   Wt 80 kg   SpO2 96%   BMI 23.92 kg/m   Physical Exam Vitals and nursing note reviewed.  Constitutional:      General: He is not in acute distress.    Appearance: He is well-developed. He is not ill-appearing, toxic-appearing or diaphoretic.  HENT:     Head: Normocephalic and atraumatic.  Eyes:     Conjunctiva/sclera: Conjunctivae normal.  Cardiovascular:     Rate and Rhythm: Normal rate and regular rhythm.     Heart sounds: No murmur heard. Pulmonary:     Effort: Pulmonary effort is normal. No tachypnea or respiratory distress.     Breath sounds: Normal breath sounds.  Abdominal:     Palpations: Abdomen is soft.     Tenderness: There is no abdominal tenderness.  Musculoskeletal:        General: No swelling.     Cervical back: Normal range of motion and neck supple.     Right lower leg: No edema.     Left lower leg: Edema present.  Skin:    General: Skin is warm and dry.  Neurological:     General: No focal deficit present.     Mental Status: He is alert and oriented to person, place, and time.  Psychiatric:        Mood and Affect: Mood normal.        Behavior: Behavior normal.     Procedures  Procedures  ED Course / MDM   Clinical Course as of 06/05/22 0055  Wed Jun 04, 2022  1653 EKG demo no stemi. Initial trop 809. ASA ordered. Heparin ordered for ACS-likely NSTEMI. Cards consult placed. Pt does endorse unilateral left leg swelling x 1 week and significant sob. No hypoxia. Well appearing on exam. Will rule out PE. Will require heparin dose changes if PE.  [AG]    Clinical Course User Index [AG] Lianne Cure, DO    Medical Decision Making Amount and/or Complexity of Data Reviewed Labs: ordered. Radiology: ordered.  Risk OTC drugs. Prescription drug management. Decision regarding hospitalization.   On initial assessment, patient endorses continued chest pain, currently 8/10 in severity.  Morphine and NTG ointment were ordered.  I spoke with cardiologist on-call, Dr. Burt Knack, who graciously came to evaluate the patient in the ED.  Dr. Burt Knack recommends hospitalist admission, rule out of PE, and likely LHC tomorrow, unless he clinically worsens tonight.  Patient was admitted to hospitalist for further management.       Godfrey Pick, MD 06/05/22 [phone]

## 2022-06-05 ENCOUNTER — Encounter: Payer: MEDICAID | Admitting: Podiatry

## 2022-06-05 ENCOUNTER — Encounter (HOSPITAL_COMMUNITY)
Admission: EM | Disposition: A | Discharge status: Home Health | Payer: Self-pay | Source: Home / Self Care | Attending: Internal Medicine

## 2022-06-05 ENCOUNTER — Inpatient Hospital Stay (HOSPITAL_COMMUNITY): Payer: Commercial Managed Care - HMO

## 2022-06-05 ENCOUNTER — Other Ambulatory Visit (HOSPITAL_COMMUNITY): Payer: Self-pay

## 2022-06-05 ENCOUNTER — Encounter (HOSPITAL_COMMUNITY): Payer: Self-pay | Admitting: Interventional Cardiology

## 2022-06-05 ENCOUNTER — Telehealth (HOSPITAL_COMMUNITY): Payer: Self-pay | Admitting: Pharmacy Technician

## 2022-06-05 ENCOUNTER — Other Ambulatory Visit (HOSPITAL_COMMUNITY): Payer: Commercial Managed Care - HMO

## 2022-06-05 DIAGNOSIS — I5021 Acute systolic (congestive) heart failure: Secondary | ICD-10-CM | POA: Diagnosis not present

## 2022-06-05 DIAGNOSIS — I5031 Acute diastolic (congestive) heart failure: Secondary | ICD-10-CM | POA: Diagnosis not present

## 2022-06-05 DIAGNOSIS — R079 Chest pain, unspecified: Secondary | ICD-10-CM | POA: Diagnosis not present

## 2022-06-05 DIAGNOSIS — I214 Non-ST elevation (NSTEMI) myocardial infarction: Secondary | ICD-10-CM | POA: Diagnosis not present

## 2022-06-05 DIAGNOSIS — L97529 Non-pressure chronic ulcer of other part of left foot with unspecified severity: Secondary | ICD-10-CM

## 2022-06-05 DIAGNOSIS — E11621 Type 2 diabetes mellitus with foot ulcer: Secondary | ICD-10-CM

## 2022-06-05 HISTORY — PX: CORONARY STENT INTERVENTION: CATH118234

## 2022-06-05 HISTORY — PX: LEFT HEART CATH AND CORONARY ANGIOGRAPHY: CATH118249

## 2022-06-05 LAB — BASIC METABOLIC PANEL
Anion gap: 9 (ref 5–15)
BUN: 18 mg/dL (ref 8–23)
CO2: 22 mmol/L (ref 22–32)
Calcium: 8.7 mg/dL — ABNORMAL LOW (ref 8.9–10.3)
Chloride: 100 mmol/L (ref 98–111)
Creatinine, Ser: 1.24 mg/dL (ref 0.61–1.24)
GFR, Estimated: >60 mL/min (ref >60)
Glucose, Bld: 157 mg/dL — ABNORMAL HIGH (ref 70–99)
Potassium: 4.3 mmol/L (ref 3.5–5.1)
Sodium: 131 mmol/L — ABNORMAL LOW (ref 135–145)

## 2022-06-05 LAB — MAGNESIUM: Magnesium: 1.9 mg/dL (ref 1.7–2.4)

## 2022-06-05 LAB — CBC
HCT: 30.1 % — ABNORMAL LOW (ref 39.0–52.0)
Hemoglobin: 9.9 g/dL — ABNORMAL LOW (ref 13.0–17.0)
MCH: 28.2 pg (ref 26.0–34.0)
MCHC: 32.9 g/dL (ref 30.0–36.0)
MCV: 85.8 fL (ref 80.0–100.0)
Platelets: 128 10*3/uL — ABNORMAL LOW (ref 150–400)
RBC: 3.51 MIL/uL — ABNORMAL LOW (ref 4.22–5.81)
RDW: 14.1 % (ref 11.5–15.5)
WBC: 5 10*3/uL (ref 4.0–10.5)
nRBC: 0 % (ref 0.0–0.2)

## 2022-06-05 LAB — CBG MONITORING, ED: Glucose-Capillary: 153 mg/dL — ABNORMAL HIGH (ref 70–99)

## 2022-06-05 LAB — HEPARIN LEVEL (UNFRACTIONATED): Heparin Unfractionated: 0.14 IU/mL — ABNORMAL LOW (ref 0.30–0.70)

## 2022-06-05 LAB — GLUCOSE, CAPILLARY
Glucose-Capillary: 159 mg/dL — ABNORMAL HIGH (ref 70–99)
Glucose-Capillary: 156 mg/dL — ABNORMAL HIGH (ref 70–99)
Glucose-Capillary: 203 mg/dL — ABNORMAL HIGH (ref 70–99)

## 2022-06-05 LAB — LIPID PANEL
Cholesterol: 188 mg/dL (ref 0–200)
HDL: 38 mg/dL — ABNORMAL LOW (ref >40)
LDL Cholesterol: 126 mg/dL — ABNORMAL HIGH (ref 0–99)
Total CHOL/HDL Ratio: 4.9 RATIO
Triglycerides: 118 mg/dL (ref <150)
VLDL: 24 mg/dL (ref 0–40)

## 2022-06-05 LAB — POCT ACTIVATED CLOTTING TIME
Activated Clotting Time: 311 seconds
Activated Clotting Time: 317 seconds
Activated Clotting Time: 275 seconds

## 2022-06-05 LAB — ECHOCARDIOGRAM LIMITED
Area-P 1/2: 4.8 cm2
Calc EF: 42.6 %
Height: 72 in
S' Lateral: 3.9 cm
Single Plane A2C EF: 38 %
Single Plane A4C EF: 40 %
Weight: 2821.89 oz

## 2022-06-05 SURGERY — LEFT HEART CATH AND CORONARY ANGIOGRAPHY
Anesthesia: LOCAL

## 2022-06-05 MED ORDER — IOHEXOL 350 MG/ML SOLN
INTRAVENOUS | Status: DC | PRN
  Administered 2022-06-05: 260 mL

## 2022-06-05 MED ORDER — TICAGRELOR 90 MG PO TABS
ORAL_TABLET | ORAL | Status: DC | PRN
  Administered 2022-06-05: 180 mg via ORAL

## 2022-06-05 MED ORDER — HEPARIN SODIUM (PORCINE) 1000 UNIT/ML IJ SOLN
INTRAMUSCULAR | Status: AC
  Filled 2022-06-05: qty 10

## 2022-06-05 MED ORDER — HEPARIN SODIUM (PORCINE) 1000 UNIT/ML IJ SOLN
INTRAMUSCULAR | Status: AC
Start: 2022-06-05 — End: ?
  Filled 2022-06-05: qty 10

## 2022-06-05 MED ORDER — FENTANYL 75 MCG/HR TD PT72: 1.0000 | MEDICATED_PATCH | TRANSDERMAL | Status: DC

## 2022-06-05 MED ORDER — ASPIRIN 81 MG PO CHEW
81.0000 mg | CHEWABLE_TABLET | Freq: Every day | ORAL | Status: DC
  Administered 2022-06-06 – 2022-06-08 (×3): 81 mg via ORAL
  Filled 2022-06-05 (×3): qty 1

## 2022-06-05 MED ORDER — HEPARIN SODIUM (PORCINE) 1000 UNIT/ML IJ SOLN
INTRAMUSCULAR | Status: DC | PRN
  Administered 2022-06-05: 6000 [IU] via INTRAVENOUS
  Administered 2022-06-05: 4000 [IU] via INTRAVENOUS

## 2022-06-05 MED ORDER — FENTANYL CITRATE (PF) 100 MCG/2ML IJ SOLN
25.0000 ug | Freq: Once | INTRAMUSCULAR | Status: AC
  Administered 2022-06-05: 25 ug via INTRAVENOUS

## 2022-06-05 MED ORDER — LIDOCAINE HCL (PF) 1 % IJ SOLN
INTRAMUSCULAR | Status: DC | PRN
  Administered 2022-06-05: 2 mL

## 2022-06-05 MED ORDER — ACETAMINOPHEN 325 MG PO TABS: 650.0000 mg | ORAL_TABLET | ORAL | Status: DC | PRN

## 2022-06-05 MED ORDER — FENTANYL CITRATE PF 50 MCG/ML IJ SOSY
25.0000 ug | PREFILLED_SYRINGE | Freq: Once | INTRAMUSCULAR | Status: AC
  Administered 2022-06-05: 25 ug via INTRAVENOUS
  Filled 2022-06-05: qty 1

## 2022-06-05 MED ORDER — HYDRALAZINE HCL 20 MG/ML IJ SOLN: 10.0000 mg | INTRAMUSCULAR | Status: AC | PRN

## 2022-06-05 MED ORDER — INSULIN ASPART 100 UNIT/ML IJ SOLN
0.0000 [IU] | Freq: Three times a day (TID) | INTRAMUSCULAR | Status: DC
  Administered 2022-06-06 (×4): 3 [IU] via SUBCUTANEOUS
  Administered 2022-06-07: 2 [IU] via SUBCUTANEOUS
  Administered 2022-06-07 – 2022-06-08 (×2): 3 [IU] via SUBCUTANEOUS
  Administered 2022-06-08: 2 [IU] via SUBCUTANEOUS
  Administered 2022-06-08: 3 [IU] via SUBCUTANEOUS

## 2022-06-05 MED ORDER — SODIUM CHLORIDE 0.9% FLUSH: 3.0000 mL | INTRAVENOUS | Status: DC | PRN

## 2022-06-05 MED ORDER — HEPARIN (PORCINE) IN NACL 1000-0.9 UT/500ML-% IV SOLN
INTRAVENOUS | Status: AC
  Filled 2022-06-05: qty 1000

## 2022-06-05 MED ORDER — SODIUM CHLORIDE 0.9 % IV SOLN: 250.0000 mL | INTRAVENOUS | Status: DC | PRN

## 2022-06-05 MED ORDER — VERAPAMIL HCL 2.5 MG/ML IV SOLN
INTRAVENOUS | Status: DC | PRN
  Administered 2022-06-05: 10 mL via INTRA_ARTERIAL

## 2022-06-05 MED ORDER — HEPARIN BOLUS VIA INFUSION
2500.0000 [IU] | Freq: Once | INTRAVENOUS | Status: AC
  Administered 2022-06-05: 2500 [IU] via INTRAVENOUS
  Filled 2022-06-05: qty 2500

## 2022-06-05 MED ORDER — TICAGRELOR 90 MG PO TABS
ORAL_TABLET | ORAL | Status: AC
  Filled 2022-06-05: qty 2

## 2022-06-05 MED ORDER — FENTANYL CITRATE (PF) 100 MCG/2ML IJ SOLN: 25.0000 ug | Freq: Once | INTRAMUSCULAR | Status: DC

## 2022-06-05 MED ORDER — SODIUM CHLORIDE 0.9% FLUSH
3.0000 mL | Freq: Two times a day (BID) | INTRAVENOUS | Status: DC
  Administered 2022-06-05 – 2022-06-07 (×3): 3 mL via INTRAVENOUS

## 2022-06-05 MED ORDER — OXYCODONE HCL 5 MG PO TABS
ORAL_TABLET | ORAL | Status: AC
  Filled 2022-06-05: qty 2

## 2022-06-05 MED ORDER — VERAPAMIL HCL 2.5 MG/ML IV SOLN
INTRAVENOUS | Status: AC
  Filled 2022-06-05: qty 2

## 2022-06-05 MED ORDER — HEPARIN (PORCINE) 25000 UT/250ML-% IV SOLN
1550.0000 [IU]/h | INTRAVENOUS | Status: DC
Start: 2022-06-05 — End: 2022-06-05
  Administered 2022-06-05: 1550 [IU]/h via INTRAVENOUS
  Filled 2022-06-05: qty 250

## 2022-06-05 MED ORDER — FUROSEMIDE 10 MG/ML IJ SOLN
INTRAMUSCULAR | Status: AC
  Filled 2022-06-05: qty 4

## 2022-06-05 MED ORDER — PERFLUTREN LIPID MICROSPHERE
1.0000 mL | INTRAVENOUS | Status: AC | PRN
  Administered 2022-06-05: 3 mL via INTRAVENOUS

## 2022-06-05 MED ORDER — HEPARIN SODIUM (PORCINE) 5000 UNIT/ML IJ SOLN
5000.0000 [IU] | Freq: Three times a day (TID) | INTRAMUSCULAR | Status: DC
  Administered 2022-06-05 – 2022-06-06 (×2): 5000 [IU] via SUBCUTANEOUS
  Filled 2022-06-05: qty 1

## 2022-06-05 MED ORDER — NITROGLYCERIN 1 MG/10 ML FOR IR/CATH LAB
INTRA_ARTERIAL | Status: DC | PRN
  Administered 2022-06-05: 200 ug via INTRACORONARY
  Administered 2022-06-05: 100 ug via INTRACORONARY

## 2022-06-05 MED ORDER — NITROGLYCERIN 1 MG/10 ML FOR IR/CATH LAB
INTRA_ARTERIAL | Status: AC
  Filled 2022-06-05: qty 10

## 2022-06-05 MED ORDER — FENTANYL CITRATE (PF) 100 MCG/2ML IJ SOLN
INTRAMUSCULAR | Status: DC | PRN
  Administered 2022-06-05 (×3): 25 ug via INTRAVENOUS

## 2022-06-05 MED ORDER — LIDOCAINE 5 % EX PTCH
1.0000 | MEDICATED_PATCH | CUTANEOUS | Status: DC
  Administered 2022-06-05 – 2022-06-07 (×3): 1 via TRANSDERMAL
  Filled 2022-06-05 (×3): qty 1

## 2022-06-05 MED ORDER — FUROSEMIDE 10 MG/ML IJ SOLN
20.0000 mg | Freq: Once | INTRAMUSCULAR | Status: AC
  Administered 2022-06-05: 20 mg via INTRAVENOUS

## 2022-06-05 MED ORDER — OXYCODONE HCL 5 MG PO TABS
5.0000 mg | ORAL_TABLET | ORAL | Status: DC | PRN
  Administered 2022-06-05 – 2022-06-08 (×15): 10 mg via ORAL
  Filled 2022-06-05 (×14): qty 2

## 2022-06-05 MED ORDER — ONDANSETRON HCL 4 MG/2ML IJ SOLN: 4.0000 mg | Freq: Four times a day (QID) | INTRAMUSCULAR | Status: DC | PRN

## 2022-06-05 MED ORDER — FENTANYL CITRATE (PF) 100 MCG/2ML IJ SOLN
INTRAMUSCULAR | Status: AC
  Filled 2022-06-05: qty 2

## 2022-06-05 MED ORDER — LIDOCAINE HCL (PF) 1 % IJ SOLN
INTRAMUSCULAR | Status: AC
  Filled 2022-06-05: qty 30

## 2022-06-05 MED ORDER — MIDAZOLAM HCL 2 MG/2ML IJ SOLN
INTRAMUSCULAR | Status: DC | PRN
  Administered 2022-06-05: 1 mg via INTRAVENOUS

## 2022-06-05 MED ORDER — HEPARIN (PORCINE) IN NACL 1000-0.9 UT/500ML-% IV SOLN
INTRAVENOUS | Status: DC | PRN
  Administered 2022-06-05 (×2): 500 mL

## 2022-06-05 MED ORDER — TICAGRELOR 90 MG PO TABS
90.0000 mg | ORAL_TABLET | Freq: Two times a day (BID) | ORAL | Status: DC
  Administered 2022-06-05 – 2022-06-06 (×2): 90 mg via ORAL
  Filled 2022-06-05 (×2): qty 1

## 2022-06-05 MED ORDER — LABETALOL HCL 5 MG/ML IV SOLN: 10.0000 mg | INTRAVENOUS | Status: AC | PRN

## 2022-06-05 MED ORDER — FUROSEMIDE 10 MG/ML IJ SOLN: 40.0000 mg | Freq: Once | INTRAMUSCULAR | Status: DC

## 2022-06-05 MED ORDER — HEPARIN SODIUM (PORCINE) 1000 UNIT/ML IJ SOLN
2000.0000 [IU] | Freq: Once | INTRAMUSCULAR | Status: AC
  Administered 2022-06-05: 2000 [IU] via INTRAVENOUS

## 2022-06-05 MED ORDER — CEPHALEXIN 500 MG PO CAPS
500.0000 mg | ORAL_CAPSULE | Freq: Three times a day (TID) | ORAL | Status: DC
Start: 2022-06-05 — End: 2022-06-08
  Administered 2022-06-05 – 2022-06-08 (×9): 500 mg via ORAL
  Filled 2022-06-05 (×9): qty 1

## 2022-06-05 MED ORDER — FENTANYL 75 MCG/HR TD PT72
1.0000 | MEDICATED_PATCH | TRANSDERMAL | Status: DC
  Administered 2022-06-05: 1 via TRANSDERMAL
  Filled 2022-06-05 (×2): qty 1

## 2022-06-05 MED ORDER — MIDAZOLAM HCL 2 MG/2ML IJ SOLN
INTRAMUSCULAR | Status: AC
  Filled 2022-06-05: qty 2

## 2022-06-05 MED ORDER — SODIUM CHLORIDE 0.9 % IV SOLN: INTRAVENOUS | Status: AC

## 2022-06-05 SURGICAL SUPPLY — 21 items
BALL SAPPHIRE NC24 3.5X15 (BALLOONS) ×2
BALLN SAPPHIRE 2.5X15 (BALLOONS) ×2
BALLOON SAPPHIRE 2.5X15 (BALLOONS) IMPLANT
BALLOON SAPPHIRE NC24 3.5X15 (BALLOONS) IMPLANT
CATH 5FR JL3.5 JR4 ANG PIG MP (CATHETERS) ×1 IMPLANT
CATH VISTA GUIDE 6FR XBLAD3.5 (CATHETERS) ×1 IMPLANT
DEVICE RAD COMP TR BAND LRG (VASCULAR PRODUCTS) ×1 IMPLANT
GLIDESHEATH SLEND A-KIT 6F 22G (SHEATH) ×1 IMPLANT
GUIDEWIRE INQWIRE 1.5J.035X260 (WIRE) IMPLANT
INQWIRE 1.5J .035X260CM (WIRE) ×2
KIT ENCORE 26 ADVANTAGE (KITS) ×1 IMPLANT
KIT HEART LEFT (KITS) ×2 IMPLANT
PACK CARDIAC CATHETERIZATION (CUSTOM PROCEDURE TRAY) ×2 IMPLANT
SHEATH PROBE COVER 6X72 (BAG) ×1 IMPLANT
STENT SYNERGY XD 3.0X12 (Permanent Stent) IMPLANT
STENT SYNERGY XD 3.50X16 (Permanent Stent) IMPLANT
SYNERGY XD 3.0X12 (Permanent Stent) ×2 IMPLANT
SYNERGY XD 3.50X16 (Permanent Stent) ×2 IMPLANT
TRANSDUCER W/STOPCOCK (MISCELLANEOUS) ×2 IMPLANT
TUBING CIL FLEX 10 FLL-RA (TUBING) ×2 IMPLANT
WIRE ASAHI PROWATER 180CM (WIRE) ×1 IMPLANT

## 2022-06-05 NOTE — Progress Notes (Signed)
Dr Tamala Julian to bedside, orders obtained, ok to transfer pt to floor, [facility], supervisor remains at bedside

## 2022-06-05 NOTE — CV Procedure (Signed)
High-grade LAD proximal treated with overlapping Synergy stents to the less than 40% stenosis with TIMI grade III flow. Other coronaries are generally clean. Anterior wall severe hypokinesis.  LVEDP 21 mmHg. To ICU or progressive bed where nitro can be titrated. Serial markers and EKGs.

## 2022-06-05 NOTE — Progress Notes (Signed)
*  PRELIMINARY RESULTS* Echocardiogram 2D Echocardiogram has been performed with Definity.  Samuel Germany 06/05/2022, 3:24 PM

## 2022-06-05 NOTE — Interval H&P Note (Signed)
Cath Lab Visit (complete for each Cath Lab visit)  Clinical Evaluation Leading to the Procedure:   ACS: Yes.    Non-ACS:    Anginal Classification: CCS IV  Anti-ischemic medical therapy: Maximal Therapy (2 or more classes of medications)  Non-Invasive Test Results: No non-invasive testing performed  Prior CABG: No previous CABG      History and Physical Interval Note:  06/05/2022 9:25 AM  Robert Burnett  has presented today for surgery, with the diagnosis of chest pain.  The various methods of treatment have been discussed with the patient and family. After consideration of risks, benefits and other options for treatment, the patient has consented to  Procedure(s): LEFT HEART CATH AND CORONARY ANGIOGRAPHY (N/A) as a surgical intervention.  The patient's history has been reviewed, patient examined, no change in status, stable for surgery.  I have reviewed the patient's chart and labs.  Questions were answered to the patient's satisfaction.     Belva Crome III

## 2022-06-05 NOTE — Progress Notes (Signed)
ANTICOAGULATION CONSULT NOTE - Initial Consult  Pharmacy Consult for heparin Indication: chest pain/ACS  Allergies  Allergen Reactions   Amlodipine Rash   Prozac [Fluoxetine Hcl] Rash    Patient Measurements: Height: 6' (182.9 cm) Weight: 80 kg (176 lb 5.9 oz) IBW/kg (Calculated) : 77.6   Vital Signs: Temp: 98.7 F (37.1 C) (08/16 2203) Temp Source: Oral (08/16 2203) BP: 113/68 (08/16 2345) Pulse Rate: 70 (08/16 2345)  Labs: Recent Labs    06/04/22 1517 06/04/22 1713 06/04/22 1909 06/04/22 2127 06/04/22 2305  HGB 10.4*  --   --   --   --   HCT 32.1*  --   --   --   --   PLT 145*  --   --   --   --   HEPARINUNFRC  --   --   --   --  <0.10*  CREATININE 1.17  --   --   --   --   TROPONINIHS 809* 817* 654* 549*  --     Estimated Creatinine Clearance: 71.9 mL/min (by C-G formula based on SCr of 1.17 mg/dL).   Medical History: Past Medical History:  Diagnosis Date   Allergy    Anemia    Arthritis    ARTHRITIS IN SPINE - MAKES PT HAVE CHEST PAIN- USES fENTANYL PATCH    Asthma    IN TEENS    Bacteremia    a. 03/2022 Group B Strep bacteremia in setting of diabetic L foot infxn/gangrene/osteo.   Diabetes mellitus    Diabetic peripheral neuropathy associated with type 2 diabetes mellitus ([place]) 12/12/2011   Fatty liver disease, nonalcoholic 80/99/8338   Foot osteomyelitis, left ([place])    a. 2012 s/p toe amputations on L; b. 03/2022 - admitted w/ wet gangrene and necrotizing infxn w/ osteomyelitis-->s/p transmetatarsal followed by Lisfranc amputation.   GERD (gastroesophageal reflux disease)    H/O degenerative disc disease    History of echocardiogram    a. 03/2022 Echo: EF 55-60%, no rwma, nl RV fxn, mildly elev PASP. Mild MR.   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([place])     Assessment: 85 YOM presents with NSTEMI.   Heparin level undetectable on infusion at 1000 units/hr. No issues with line or  bleeding reported per RN. Plan for cath 8/17 a.m.  Goal of Therapy:  Heparin level 0.3-0.7 units/ml Monitor platelets by anticoagulation protocol: Yes   Plan:  Rebolus heparin 2500 units  Increase heparin infusion to 1300 units/hr Will f/u 6h heparin level  Sherlon Handing, PharmD, BCPS Please see amion for complete clinical pharmacist phone list 06/05/2022 12:31 AM

## 2022-06-05 NOTE — Consult Note (Signed)
Reason for Consult:Ulcer Referring Physician: Dr. Florencia Reasons, MD  Robert Burnett is an 61 y.o. male.  HPI: 61 year old male with past medical history significant for diabetes, peripheral neuropathy, chronic pain with history of arthritis, diabetic foot ulcerations, hypertension, tobacco abuse presented to the hospital for chest pain found to have an NSTEMI underwent stenting today.  Podiatry consulted for lower extremity ulceration.  He recently underwent surgery with Dr. Jacquelynn Cree on March 28, 2022 for Lisfranc amputation.  He was last seen in the office on May 08, 2022.  At the time he was found to be progressing well and she can transition to regular shoe.  About 2 weeks ago he noticed a wound on the bottom of his foot.  He did not come the office as he was not feeling well.  He was scheduled for an appointment today with Dr. Sherryle Lis.  Past Medical History:  Diagnosis Date   Allergy    Anemia    Arthritis    ARTHRITIS IN SPINE - MAKES PT HAVE CHEST PAIN- USES fENTANYL PATCH    Asthma    IN TEENS    Bacteremia    a. 03/2022 Group B Strep bacteremia in setting of diabetic L foot infxn/gangrene/osteo.   Diabetes mellitus    Diabetic peripheral neuropathy associated with type 2 diabetes mellitus ([place]) 12/12/2011   Fatty liver disease, nonalcoholic 61/44/3154   Foot osteomyelitis, left ([place])    a. 2012 s/p toe amputations on L; b. 03/2022 - admitted w/ wet gangrene and necrotizing infxn w/ osteomyelitis-->s/p transmetatarsal followed by Lisfranc amputation.   GERD (gastroesophageal reflux disease)    H/O degenerative disc disease    History of echocardiogram    a. 03/2022 Echo: EF 55-60%, no rwma, nl RV fxn, mildly elev PASP. Mild MR.   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[facility])     Past Surgical History:  Procedure Laterality Date   ACHILLES TENDON SURGERY Left 03/28/2022   Procedure: ACHILLES LENGTHENING/KIDNER;  Surgeon:  [name], DPM;  Location: [facility];  Service: Podiatry;  Laterality: Left;   AMPUTATION Left 03/25/2022   Procedure: AMPUTATION DIGITS,TRANSMETARSAL AMPUTATION, REMOVAL OF TOES AND BONE BEHIND TOES ;  Surgeon: [name], DPM;  Location: [facility];  Service: Podiatry;  Laterality: Left;   APPLICATION OF WOUND VAC Left 03/25/2022   Procedure: APPLICATION OF WOUND VAC;  Surgeon: [name], DPM;  Location: [facility];  Service: Podiatry;  Laterality: Left;   CORONARY STENT INTERVENTION N/A 06/05/2022   Procedure: CORONARY STENT INTERVENTION;  Surgeon: Belva Crome, MD;  Location: [facility];  Service: Cardiovascular;  Laterality: N/A;   CYSTOSCOPY/RETROGRADE/URETEROSCOPY Left 10/29/2020   Procedure: CYSTOSCOPY/RETROGRADE/ LEFT URETEROSCOPY WITH BIOPSY, LEFT URETERAL STENT;  Surgeon: Raynelle Bring, MD;  Location: [facility];  Service: Urology;  Laterality: Left;   IRRIGATION AND DEBRIDEMENT FOOT Left 03/28/2022   Procedure: AMPUTATION LISFRANC, SECONDARY WOUND CLOSURE;  Surgeon: [name], DPM;  [facility]: [facility];  Service: Podiatry;  Laterality: Left;   KNEE SURGERY Bilateral 1975 and 1989   LEFT HEART CATH AND CORONARY ANGIOGRAPHY N/A 06/05/2022   Procedure: LEFT HEART CATH AND CORONARY ANGIOGRAPHY;  Surgeon: Belva Crome, MD;  Location: [facility];  Service: Cardiovascular;  Laterality: N/A;   MULTIPLE TOOTH EXTRACTIONS     ROBOT ASSITED LAPAROSCOPIC NEPHROURETERECTOMY Left 12/13/2020   Procedure: XI  ROBOT ASSITED LAPAROSCOPIC NEPHROURETERECTOMY , POST OPERATIVE INTRAVESICAL GEMCITABINE;  Surgeon: Raynelle Bring, MD;  [facility];  Service: Urology;  Laterality: Left;  ONLY NEEDS 240 MIN   TOEM AMPUTATION  Left    2012, second toe   TRANSURETHRAL RESECTION OF BLADDER TUMOR N/A 08/29/2021   Procedure: TRANSURETHRAL RESECTION OF BLADDER TUMOR (TURBT) WITH CYSTOSCOPY;  Surgeon: Raynelle Bring, MD;  [facility];  Service: Urology;  Laterality: N/A;  GENERAL  ANESTHESIA WITH PARALYSIS   WISDOM TOOTH EXTRACTION      Family History  Problem Relation Age of Onset   Cancer Mother    Hypertension Father    Diabetes Father    Lung cancer Father    Diabetes Sister    Cancer Sister     Social History:  reports that he has been smoking pipe and cigarettes. He has a 10.00 pack-year smoking history. He has quit using smokeless tobacco.  His smokeless tobacco use included chew. He reports that he does not drink alcohol and does not use drugs.  Allergies:  Allergies  Allergen Reactions   Amlodipine Rash   Prozac [Fluoxetine Hcl] Rash    Medications: I have reviewed the patient's current medications.  Results for orders placed or performed during the hospital encounter of 06/04/22 (from the past 48 hour(s))  CBC with Differential     Status: Abnormal   Collection Time: 06/04/22  3:17 PM  Result Value Ref Range   WBC 5.9 4.0 - 10.5 K/uL   RBC 3.71 (L) 4.22 - 5.81 MIL/uL   Hemoglobin 10.4 (L) 13.0 - 17.0 g/dL   HCT 32.1 (L) 39.0 - 52.0 %   MCV 86.5 80.0 - 100.0 fL   MCH 28.0 26.0 - 34.0 pg   MCHC 32.4 30.0 - 36.0 g/dL   RDW 14.0 11.5 - 15.5 %   Platelets 145 (L) 150 - 400 K/uL   nRBC 0.0 0.0 - 0.2 %   Neutrophils Relative % 73 %   Neutro Abs 4.3 1.7 - 7.7 K/uL   Lymphocytes Relative 17 %   Lymphs Abs 1.0 0.7 - 4.0 K/uL   Monocytes Relative 8 %   Monocytes Absolute 0.5 0.1 - 1.0 K/uL   Eosinophils Relative 1 %   Eosinophils Absolute 0.1 0.0 - 0.5 K/uL   Basophils Relative 1 %   Basophils Absolute 0.0 0.0 - 0.1 K/uL   Immature Granulocytes 0 %   Abs Immature Granulocytes 0.02 0.00 - 0.07 K/uL    Comment: Performed at [facility], [address] [place], [place] [address]  Basic metabolic panel     Status: Abnormal   Collection Time: 06/04/22  3:17 PM  Result Value Ref Range   Sodium 136 135 - 145 mmol/L   Potassium 4.0 3.5 - 5.1 mmol/L   Chloride 103 98 - 111 mmol/L   CO2 23 22 - 32 mmol/L   Glucose, Bld 119 (H) 70 - 99  mg/dL    Comment: Glucose reference range applies only to samples taken after fasting for at least 8 hours.   BUN 17 8 - 23 mg/dL   Creatinine, Ser 1.17 0.61 - 1.24 mg/dL   Calcium 9.2 8.9 - 10.3 mg/dL   GFR, Estimated >60 >60 mL/min    Comment: (NOTE) Calculated using the CKD-EPI Creatinine Equation (2021)    Anion gap 10 5 - 15    Comment: Performed at [facility] [address], [place], [place] [address]  Troponin I (High  Sensitivity)     Status: Abnormal   Collection Time: 06/04/22  3:17 PM  Result Value Ref Range   Troponin I (High Sensitivity) 809 (HH) <18 ng/L    Comment: CRITICAL RESULT CALLED TO, READ BACK BY AND VERIFIED WITH A THORNTON,RN 1639 06/04/2022 WBOND (NOTE) Elevated high sensitivity troponin I (hsTnI) values and significant  changes across serial measurements may suggest ACS but many other  chronic and acute conditions are known to elevate hsTnI results.  Refer to the "Links" section for chest pain algorithms and additional  guidance. Performed at [facility], [place] [address], [place], [place] [address]   Magnesium     Status: Abnormal   Collection Time: 06/04/22  3:17 PM  Result Value Ref Range   Magnesium 1.6 (L) 1.7 - 2.4 mg/dL    Comment: Performed at [facility] [address], [place], [place] [address]  Troponin I (High Sensitivity)     Status: Abnormal   Collection Time: 06/04/22  5:13 PM  Result Value Ref Range   Troponin I (High Sensitivity) 817 (HH) <18 ng/L    Comment: CRITICAL VALUE NOTED. VALUE IS CONSISTENT WITH PREVIOUSLY REPORTED/CALLED VALUE (NOTE) Elevated high sensitivity troponin I (hsTnI) values and significant  changes across serial measurements may suggest ACS but many other  chronic and acute conditions are known to elevate hsTnI results.  Refer to the "Links" section for chest pain algorithms and additional  guidance. Performed at [facility], [place] [address] [place], [place] [address]   D-dimer,  quantitative     Status: Abnormal   Collection Time: 06/04/22  5:13 PM  Result Value Ref Range   D-Dimer, Quant 0.88 (H) 0.00 - 0.50 ug/mL-FEU    Comment: (NOTE) At the manufacturer cut-off value of 0.5 g/mL FEU, this assay has a negative predictive value of 95-100%.This assay is intended for use in conjunction with a clinical pretest probability (PTP) assessment model to exclude pulmonary embolism (PE) and deep venous thrombosis (DVT) in outpatients suspected of PE or DVT. Results should be correlated with clinical presentation. Performed at [facility], [place] [address] [place], [place] [address]   HIV Antibody (routine testing w rflx)     Status: None   Collection Time: 06/04/22  7:09 PM  Result Value Ref Range   HIV Screen 4th Generation wRfx Non Reactive Non Reactive    Comment: Performed at [facility], [place] [address] [place], [place] [address]  Troponin I (High Sensitivity)     Status: Abnormal   Collection Time: 06/04/22  7:09 PM  Result Value Ref Range   Troponin I (High Sensitivity) 654 (HH) <18 ng/L    Comment: CRITICAL VALUE NOTED. VALUE IS CONSISTENT WITH PREVIOUSLY REPORTED/CALLED VALUE (NOTE) Elevated high sensitivity troponin I (hsTnI) values and significant  changes across serial measurements may suggest ACS but many other  chronic and acute conditions are known to elevate hsTnI results.  Refer to the "Links" section for chest pain algorithms and additional  guidance. Performed at [facility], [place] [address] [place], [place] [address]   CBG monitoring, ED     Status: Abnormal   Collection Time: 06/04/22  8:00 PM  Result Value Ref Range   Glucose-Capillary 135 (H) 70 - 99 mg/dL    Comment: Glucose reference range applies only to samples taken after fasting for at least 8 hours.  Troponin I (High Sensitivity)     Status: Abnormal   Collection  Time: 06/04/22  9:27 PM  Result Value Ref Range   Troponin I (High Sensitivity) 549 (HH) <18 ng/L     Comment: DELTA CHECK NOTED CRITICAL VALUE NOTED. VALUE IS CONSISTENT WITH PREVIOUSLY REPORTED/CALLED VALUE (NOTE) Elevated high sensitivity troponin I (hsTnI) values and significant  changes across serial measurements may suggest ACS but many other  chronic and acute conditions are known to elevate hsTnI results.  Refer to the "Links" section for chest pain algorithms and additional  guidance. Performed at [facility], [place] [address], [place], [place] [address]   CBG monitoring, ED     Status: Abnormal   Collection Time: 06/04/22 11:04 PM  Result Value Ref Range   Glucose-Capillary 251 (H) 70 - 99 mg/dL    Comment: Glucose reference range applies only to samples taken after fasting for at least 8 hours.  Heparin level (unfractionated)     Status: Abnormal   Collection Time: 06/04/22 11:05 PM  Result Value Ref Range   Heparin Unfractionated <0.10 (L) 0.30 - 0.70 IU/mL    Comment: (NOTE) The clinical reportable range upper limit is being lowered to >1.10 to align with the FDA approved guidance for the current laboratory assay.  If heparin results are below expected values, and patient dosage has  been confirmed, suggest follow up testing of antithrombin III levels. Performed at [facility], [place] [address] [place], [place] [address]   Magnesium     Status: None   Collection Time: 06/05/22  3:55 AM  Result Value Ref Range   Magnesium 1.9 1.7 - 2.4 mg/dL    Comment: Performed at [facility] [address] [place], [place] [address]  Lipid panel     Status: Abnormal   Collection Time: 06/05/22  3:55 AM  Result Value Ref Range   Cholesterol 188 0 - 200 mg/dL   Triglycerides 118 <150 mg/dL   HDL 38 (L) >40 mg/dL   Total CHOL/HDL Ratio 4.9 RATIO   VLDL 24 0 - 40 mg/dL   LDL Cholesterol 126 (H) 0 - 99 mg/dL    Comment:        Total Cholesterol/HDL:CHD Risk Coronary Heart Disease Risk Table                     Men   Women  1/2 Average Risk   3.4    3.3  Average Risk       5.0   4.4  2 X Average Risk   9.6   7.1  3 X Average Risk  23.4   11.0        Use the calculated Patient Ratio above and the CHD Risk Table to determine the patient's CHD Risk.        ATP III CLASSIFICATION (LDL):  <100     mg/dL   Optimal  100-129  mg/dL   Near or Above                    Optimal  130-159  mg/dL   Borderline  160-189  mg/dL   High  >190     mg/dL   Very High Performed at [facility] [address] [place], [place] [address]   Basic metabolic panel     Status: Abnormal   Collection Time: 06/05/22  3:55 AM  Result Value Ref Range   Sodium 131 (L) 135 - 145 mmol/L   Potassium 4.3 3.5 - 5.1 mmol/L  Chloride 100 98 - 111 mmol/L   CO2 22 22 - 32 mmol/L   Glucose, Bld 157 (H) 70 - 99 mg/dL    Comment: Glucose reference range applies only to samples taken after fasting for at least 8 hours.   BUN 18 8 - 23 mg/dL   Creatinine, Ser 1.24 0.61 - 1.24 mg/dL   Calcium 8.7 (L) 8.9 - 10.3 mg/dL   GFR, Estimated >60 >60 mL/min    Comment: (NOTE) Calculated using the CKD-EPI Creatinine Equation (2021)    Anion gap 9 5 - 15    Comment: Performed at [facility] [address], [place], [place] [address]  CBC     Status: Abnormal   Collection Time: 06/05/22  3:55 AM  Result Value Ref Range   WBC 5.0 4.0 - 10.5 K/uL   RBC 3.51 (L) 4.22 - 5.81 MIL/uL   Hemoglobin 9.9 (L) 13.0 - 17.0 g/dL   HCT 30.1 (L) 39.0 - 52.0 %   MCV 85.8 80.0 - 100.0 fL   MCH 28.2 26.0 - 34.0 pg   MCHC 32.9 30.0 - 36.0 g/dL   RDW 14.1 11.5 - 15.5 %   Platelets 128 (L) 150 - 400 K/uL   nRBC 0.0 0.0 - 0.2 %    Comment: Performed at [facility], [place] [address] [place], [place] [address]  Heparin level (unfractionated)     Status: Abnormal   Collection Time: 06/05/22  7:36 AM  Result Value Ref Range   Heparin Unfractionated 0.14 (L) 0.30 - 0.70 IU/mL    Comment: (NOTE) The clinical reportable range upper limit is being lowered to >1.10 to align  with the FDA approved guidance for the current laboratory assay.  If heparin results are below expected values, and patient dosage has  been confirmed, suggest follow up testing of antithrombin III levels. Performed at [facility], [place] [address] [place], [place] [address]   CBG monitoring, ED     Status: Abnormal   Collection Time: 06/05/22  7:42 AM  Result Value Ref Range   Glucose-Capillary 153 (H) 70 - 99 mg/dL    Comment: Glucose reference range applies only to samples taken after fasting for at least 8 hours.   Comment 1 Notify RN    Comment 2 Document in Chart   Glucose, capillary     Status: Abnormal   Collection Time: 06/05/22 11:31 AM  Result Value Ref Range   Glucose-Capillary 159 (H) 70 - 99 mg/dL    Comment: Glucose reference range applies only to samples taken after fasting for at least 8 hours.  Glucose, capillary     Status: Abnormal   Collection Time: 06/05/22  4:26 PM  Result Value Ref Range   Glucose-Capillary 156 (H) 70 - 99 mg/dL    Comment: Glucose reference range applies only to samples taken after fasting for at least 8 hours.    ECHOCARDIOGRAM LIMITED  Result Date: 06/05/2022    ECHOCARDIOGRAM LIMITED REPORT   Patient Name:   Robert Burnett Date of Exam: 06/05/2022 Medical Rec #:  [id number]        Height:       72.0 in Accession #:    [id number]       Weight:       176.4 lb Date of Birth:  04/27/1961         BSA:          2.020 m Patient Age:  61 years         BP:           122/77 mmHg Patient Gender: M                HR:           79 bpm. Exam Location:  Inpatient Procedure: Limited Echo, Limited Color Doppler and Cardiac Doppler Indications:    CHF-Acute Systolic D62.22  History:        Patient has prior history of Echocardiogram examinations, most                 recent 03/27/2022. Risk Factors:Hypertension, Diabetes,                 Dyslipidemia and Tobacco abuse (From Hx). NSTEMI this admit.  Sonographer:    Alvino Chapel RCS Referring Phys: [place]  1. Limted for heart failure  2. No apical thrombus with Definity contrast. Left ventricular ejection fraction, by estimation, is 40 to 45%. Left ventricular ejection fraction by 2D MOD biplane is 42.6 %. The left ventricle has mildly decreased function. The left ventricle demonstrates regional wall motion abnormalities (see scoring diagram/findings for description). There is severe hypokinesis of the left ventricular, mid-apical anteroseptal wall, anterior wall and apical segment. This is consistent with LAD territory ischemia/infarct.  3. The mitral valve is abnormal. Mild mitral valve regurgitation. Comparison(s): Changes from prior study are noted. 03/27/2022: LVEF 55-60%, normal wall motion. FINDINGS  Left Ventricle: No apical thrombus with Definity contrast. Left ventricular ejection fraction, by estimation, is 40 to 45%. Left ventricular ejection fraction by 2D MOD biplane is 42.6 %. The left ventricle has mildly decreased function. The left ventricle demonstrates regional wall motion abnormalities. Severe hypokinesis of the left ventricular, mid-apical anteroseptal wall, anterior wall and apical segment. Definity contrast agent was given IV to delineate the left ventricular endocardial borders. The left ventricular internal cavity size was normal in size. There is no left ventricular hypertrophy. Mitral Valve: The mitral valve is abnormal. There is mild thickening of the anterior and posterior mitral valve leaflet(s). Mild mitral valve regurgitation. LEFT VENTRICLE PLAX 2D                        Biplane EF (MOD) LVIDd:         5.00 cm         LV Biplane EF:   Left LVIDs:         3.90 cm                          ventricular LV PW:         1.10 cm                          ejection LV IVS:        1.00 cm                          fraction by                                                 2D MOD  biplane is LV Volumes (MOD)                                 42.6 %. LV vol d, MOD    131.0 ml A2C: LV vol d, MOD    158.0 ml A4C: LV vol s, MOD    81.2 ml A2C: LV vol s, MOD    94.8 ml A4C: LV SV MOD A2C:   49.8 ml LV SV MOD A4C:   158.0 ml LV SV MOD BP:    64.3 ml RIGHT VENTRICLE TAPSE (M-mode): 2.7 cm LEFT ATRIUM         Index LA diam:    4.20 cm 2.08 cm/m   AORTA Ao Root diam: 3.40 cm MITRAL VALVE MV Area (PHT): 4.80 cm MV Decel Time: 158 msec MV E velocity: 97.80 cm/s MV A velocity: 31.20 cm/s MV E/A ratio:  3.13 Robert Bishop MD Electronically signed by Robert Bishop MD Signature Date/Time: 06/05/2022/4:22:09 PM    Final    CARDIAC CATHETERIZATION  Result Date: 06/05/2022 CONCLUSIONS: 99% proximal LAD treated with overlapping Synergy stents dilated to 3.5 mm in diameter (3.5 x 16 proximal and overlapped distally with a 3.0 x 12).  TIMI grade II flow increased to TIMI grade III flow postprocedure.  Very distal stent margin has persistent eccentric 30 to 40% narrowing. Left main widely patent Circumflex gives origin to 4 obtuse marginals.  The third obtuse marginal is large and free of obstruction. Right coronary is dominant and contains proximal 25% narrowing. Left ventricular angiography by hand-injection demonstrated severe anteroapical hypokinesis with EF in the 35% range. Ongoing post procedural pain without acute EKG changes.  Does have anterior T wave inversion as he did prior to the procedure.  Also states that he has had chronic back pain that causes chest discomfort. RECOMMENDATIONS: IV nitroglycerin until pain controlled. Aspirin and Brilinta for 12 months. Distinguish whether ongoing chest discomfort is thoracic spine versus ongoing ischemia.  Suspect thoracic pain from disc disease.   CT Angio Chest PE W and/or Wo Contrast  Result Date: 06/04/2022 CLINICAL DATA:  Pulmonary embolism suspected, high probability. Chest pain and shortness of breath. EXAM: CT ANGIOGRAPHY CHEST WITH CONTRAST TECHNIQUE: Multidetector CT imaging of the chest was  performed using the standard protocol during bolus administration of intravenous contrast. Multiplanar CT image reconstructions and MIPs were obtained to evaluate the vascular anatomy. RADIATION DOSE REDUCTION: This exam was performed according to the departmental dose-optimization program which includes automated exposure control, adjustment of the mA and/or kV according to patient size and/or use of iterative reconstruction technique. CONTRAST:  126m OMNIPAQUE IOHEXOL 350 MG/ML SOLN COMPARISON:  11/28/2020. FINDINGS: Cardiovascular: Heart is borderline enlarged and there is no pericardial effusion. Multi-vessel coronary artery calcifications are noted. There is atherosclerotic calcification of the aorta without evidence of aneurysm. The pulmonary trunk is normal in caliber. No pulmonary artery filling defect is identified. Mediastinum/Nodes: Enlarged lymph nodes are present in the mediastinum and hilar regions bilaterally. No axillary lymphadenopathy. The thyroid gland, trachea, and esophagus are within normal limits. Lungs/Pleura: There are moderate bilateral pleural effusions, greater on the right than on the left. Hazy ground-glass attenuation is noted in the lungs bilaterally. Patchy atelectasis or infiltrate is noted in the lower lobes. No pneumothorax. Upper Abdomen: No acute abnormality. Musculoskeletal: Bilateral gynecomastia is noted. Degenerative changes are present in the thoracic spine. No acute osseous abnormality. Review of the MIP images confirms the  above findings. IMPRESSION: 1. No evidence of pulmonary embolism. 2. Hazy attenuation in the lungs bilaterally, possible edema or infiltrate. 3. Moderate bilateral pleural effusions, greater on the right than on the left with patchy atelectasis or infiltrate at the lung bases. 4. Mediastinal and axillary lymphadenopathy, may be reactive. 5. Coronary artery calcifications. 6. Aortic atherosclerosis. Electronically Signed   By: Brett Fairy M.D.   On:  06/04/2022 21:48   DG Foot 2 Views Left  Result Date: 06/04/2022 CLINICAL DATA:  Foot ulcer.  Chest pain. EXAM: LEFT FOOT - 2 VIEW COMPARISON:  Left foot x-ray 03/25/2022 FINDINGS: There has been interval amputation of the first through fifth metatarsals and phalanges. There is soft tissue swelling of the foot. There is no acute fracture or dislocation. No definite cortical erosions are identified. There are degenerative changes of the dorsal midfoot and talonavicular joint. The bones are osteopenic. IMPRESSION: 1. 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 2. Diffuse soft tissue swelling of the foot. 3. No acute bony abnormality identified. If there is high clinical concern for osteomyelitis, an MRI is more sensitive. Electronically Signed   By: Ronney Asters M.D.   On: 06/04/2022 20:15   VAS [place] LOWER EXTREMITY VENOUS (DVT) (7a-7p)  Result Date: 06/04/2022  Lower Venous DVT Study Patient Name:  RENWICK ASMAN  Date of Exam:   06/04/2022 Medical Rec #: [id number]         Accession #:    [id number] Date of Birth: July 11, 1961          Patient Gender: M Patient Age:   61 years Exam [facility]  [facility] Procedure:      VAS [place] LOWER EXTREMITY VENOUS (DVT) Referring Phys: Campbell Stall --------------------------------------------------------------------------------  Indications: Chest pain and LLE swelling.  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 Comparison Study: No previous exams Performing Technologist: Jody Hill RVT, RDMS  Examination Guidelines: A complete evaluation includes B-mode imaging, spectral Doppler, color Doppler, and power Doppler as needed of all accessible portions of each vessel. Bilateral testing is considered an integral part of a complete examination. Limited examinations for reoccurring indications may be performed as noted. The reflux portion of the exam is performed with the patient in reverse Trendelenburg.   +-----+---------------+---------+-----------+----------+--------------+ RIGHTCompressibilityPhasicitySpontaneityPropertiesThrombus Aging +-----+---------------+---------+-----------+----------+--------------+ CFV  Full           No       Yes                                 +-----+---------------+---------+-----------+----------+--------------+ Pulsatile doppler waveforms  +---------+---------------+---------+-----------+----------+-------------------+ LEFT     CompressibilityPhasicitySpontaneityPropertiesThrombus Aging      +---------+---------------+---------+-----------+----------+-------------------+ CFV      Full           No       Yes                                      +---------+---------------+---------+-----------+----------+-------------------+ SFJ      Full                                                             +---------+---------------+---------+-----------+----------+-------------------+ FV Prox  Full  No       Yes                                      +---------+---------------+---------+-----------+----------+-------------------+ FV Mid   Full           No       Yes                                      +---------+---------------+---------+-----------+----------+-------------------+ FV DistalFull           No       Yes                                      +---------+---------------+---------+-----------+----------+-------------------+ PFV      Full                                                             +---------+---------------+---------+-----------+----------+-------------------+ POP      Full           No       Yes                                      +---------+---------------+---------+-----------+----------+-------------------+ PTV      Full                                                             +---------+---------------+---------+-----------+----------+-------------------+ PERO                                                   Not well visualized +---------+---------------+---------+-----------+----------+-------------------+    Summary: RIGHT: - No evidence of common femoral vein obstruction.  LEFT: - There is no evidence of deep vein thrombosis in the lower extremity.  - No cystic structure found in the popliteal fossa. - Ultrasound characteristics of enlarged lymph nodes noted in the groin. Subcutaneous edema of calf and ankle.  *See table(s) above for measurements and observations.    Preliminary    DG Chest Portable 1 View  Result Date: 06/04/2022 CLINICAL DATA:  Chest pain EXAM: PORTABLE CHEST 1 VIEW COMPARISON:  03/25/2022 FINDINGS: Mild cardiomegaly. Mild, diffuse bilateral interstitial pulmonary opacity. The visualized skeletal structures are unremarkable. IMPRESSION: Mild cardiomegaly with mild, diffuse bilateral interstitial pulmonary opacity, likely edema. No focal airspace opacity. Electronically Signed   By: Delanna Ahmadi M.D.   On: 06/04/2022 15:11    Review of Systems Blood pressure 118/82, pulse 81, temperature 97.6 F (36.4 C), temperature source Oral, resp. rate 17, height 6' (1.829 m), weight 80 kg, SpO2 98 %. Physical Exam General: AAO x3, NAD-sitting up eating dinner  Dermatological:-Surgery is  well-healed.  There is new ulceration on the plantar medial aspect of the foot.  There is no probing to bone, no tunneling.  There is some's residual mild edema there is no erythema or warmth there is no fluctuation or crepitation.  No significant increase in warmth of the foot today.  Vascular: Dorsalis Pedis artery and Posterior Tibial artery pedal pulses are 2/4 bilateral with immedate capillary fill time. There is no pain with calf compression, swelling, warmth, erythema.   Neruologic: Sensation decreased  Musculoskeletal: No pain on exam  Assessment/Plan: Ulceration left foot  -X-rays reviewed.  No evidence of osteomyelitis. -At this time no acute surgical  intervention.  There is some minimal edema given the new ulceration would recommend starting antibiotics.  We will start him on Keflex for 1 week for soft tissue. -Recommend nonweightbearing -CAM boot for immobilization  -Elevation -Continue local wound care with daily dressing changes.  Agree with wound care consult. -If the patient still in the hospital on Saturday I will come by evaluate the wound.  Otherwise he can follow-up in clinic next week.  I will arrange the hospital follow up with our office.   Celesta Gentile, DPML  Trula Slade 06/05/2022, 5:22 PM

## 2022-06-05 NOTE — Telephone Encounter (Signed)
Pharmacy Patient Advocate Encounter  Insurance verification completed.    The patient is insured through [employer]   The patient is currently admitted and ran test claims for the following: Brilinta.  Copays and coinsurance results were relayed to Inpatient clinical team.

## 2022-06-05 NOTE — Progress Notes (Signed)
RN to bedside to assist pt back to semi fowlers position, sitting on side of bed to eat, RUE remained elevated, when returned to semi fowlers position on stretcher, tr band remains in place, but hand appeared to be more purple in color, right radial pulse remains at +2, see flowsheet for 2cc of air removed, RFA taught to touch and pain noted per pt upon touch, manual pressure applied by Eddie Dibbles, RN, Rolan Bucco, supervisor notified, as well as Dr. Tamala Julian, safety maintained

## 2022-06-05 NOTE — Progress Notes (Addendum)
Approximately 1 hour after completing the procedure, nurse noted that the right arm was firm and that the hematoma had extended from the puncture site to the mid forearm.  Compression has been performed.  The arm was tense and is now less so. He continues to complain of back and chest discomfort.  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  The discomfort in the chest that he attributed to his heart has improved. He seems to have significant chronic pain problem.  He is on a fentanyl patch at home.  We will give some IV fentanyl over the next hour. IM and heart team C will manage the patient going forward. Will repeat echo to assess LVEF after this ACS.

## 2022-06-05 NOTE — Progress Notes (Signed)
   06/05/22 1416  Assess: MEWS Score  Temp (!) 97.5 F (36.4 C)  BP (!) 146/85  MAP (mmHg) 100  Pulse Rate (!) 131  ECG Heart Rate (!) 131  Resp 18  Level of Consciousness Alert  SpO2 98 %  O2 Device Room Air  Patient Activity (if Appropriate) In bed  Assess: MEWS Score  MEWS Temp 0  MEWS Systolic 0  MEWS Pulse 3  MEWS RR 0  MEWS LOC 0  MEWS Score 3  MEWS Score Color Yellow  Assess: if the MEWS score is Yellow or Red  Were vital signs taken at a resting state? Yes  Focused Assessment No change from prior assessment  Does the patient meet 2 or more of the SIRS criteria? No  MEWS guidelines implemented *See Row Information* Yes  Treat  Pain Scale 0-10  Pain Score 8  Pain Type Chronic pain  Pain Location Back  Pain Orientation Left  Pain Descriptors / Indicators Aching;Discomfort  Pain Frequency Constant  Pain Onset Progressive  Patients Stated Pain Goal 0  Pain Intervention(s)  (Assessed)  Take Vital Signs  Increase Vital Sign Frequency  Yellow: Q 2hr X 2 then Q 4hr X 2, if remains yellow, continue Q 4hrs  Escalate  MEWS: Escalate Yellow: discuss with charge nurse/RN and consider discussing with provider and RRT  Notify: Charge Nurse/RN  Name of Charge Nurse/RN Notified Nona Dell, RN  Date Charge Nurse/RN Notified 06/05/22  Time Charge Nurse/RN Notified 1826  Notify: Provider  Provider Name/Title Dr Erlinda Hong  Date Provider Notified 06/05/22  Time Provider Notified 1826  Method of Notification Page (Chat)  Notification Reason Other (Comment) (Yellow Mews)  Provider response Other (Comment) (Seen by Dr Erlinda Hong on rounds, Pain addressed. Lidocaine patch ordered.)  Date of Provider Response 06/05/22  Time of Provider Response 1828  Document  Patient Outcome Other (Comment);Stabilized after interventions  Progress note created (see row info) Yes  Assess: SIRS CRITERIA  SIRS Temperature  0  SIRS Pulse 1  SIRS Respirations  0  SIRS WBC 1  SIRS Score Sum  2

## 2022-06-05 NOTE — ED Notes (Signed)
Pt informed that Cath lab called to indicate he was next on their list.  ASA given per cath lab request.   Heparin adjusted per pharmacy.

## 2022-06-05 NOTE — ED Notes (Signed)
Pt going to cathlab transported by cath lab personnel.   Personal belongings bagged and on bed, consent on bed.  C

## 2022-06-05 NOTE — TOC Benefit Eligibility Note (Signed)
Patient Teacher, English as a foreign language completed.    The patient is currently admitted and upon discharge could be taking Brilinta 90 mg.  The current 30 day co-pay is $436.31 due to a $6,800.00 deductible.   The patient is insured through [place], [place] Patient Advocate Specialist [place] Patient Advocate Team Direct Number: [phone]  Fax: [phone]

## 2022-06-05 NOTE — Progress Notes (Signed)
[facility] for heparin Indication: chest pain/ACS  Allergies  Allergen Reactions   Amlodipine Rash   Prozac [Fluoxetine Hcl] Rash    Patient Measurements: Height: 6' (182.9 cm) Weight: 80 kg (176 lb 5.9 oz) IBW/kg (Calculated) : 77.6   Vital Signs: Temp: 98.3 F (36.8 C) (08/17 0755) Temp Source: Oral (08/17 0755) BP: 134/83 (08/17 0800) Pulse Rate: 70 (08/17 0800)  Labs: Recent Labs    06/04/22 1517 06/04/22 1713 06/04/22 1909 06/04/22 2127 06/04/22 2305 06/05/22 0355 06/05/22 0736  HGB 10.4*  --   --   --   --  9.9*  --   HCT 32.1*  --   --   --   --  30.1*  --   PLT 145*  --   --   --   --  128*  --   HEPARINUNFRC  --   --   --   --  <0.10*  --  0.14*  CREATININE 1.17  --   --   --   --  1.24  --   TROPONINIHS 809* 817* 654* 549*  --   --   --      Estimated Creatinine Clearance: 67.8 mL/min (by C-G formula based on SCr of 1.24 mg/dL).   Assessment: 2 YOM presents with NSTEMI. Heparin level remains low at 0.14 despite adjustment. No bleeding noted. Planning cardiac cath later this morning.   Goal of Therapy:  Heparin level 0.3-0.7 units/ml Monitor platelets by anticoagulation protocol: Yes   Plan:  Heparin bolus 2500 units IV x 1 Increase heparin gtt to 1550 units/hr F/u post-cath  Salome Arnt, PharmD, BCPS, BCEMP Clinical Pharmacist Please see AMION for all pharmacy numbers 06/05/2022 8:13 AM

## 2022-06-05 NOTE — Progress Notes (Signed)
Progress Note  Patient Name: Robert Burnett Date of Encounter: 06/05/2022  [facility] Cardiologist: Sherren Mocha, MD   Subjective   Chest pain has eased off on heparin and NTG. Still with some shortness of breath.   Inpatient Medications    Scheduled Meds:  aspirin  81 mg Oral Daily   atorvastatin  80 mg Oral Daily   fentaNYL  1 patch Transdermal Q72H   finasteride  5 mg Oral Daily   insulin aspart  0-15 Units Subcutaneous TID WC   insulin detemir  5 Units Subcutaneous Daily   metoprolol tartrate  25 mg Oral BID    morphine injection  4 mg Intravenous Once   sodium chloride flush  3 mL Intravenous Q12H   Continuous Infusions:  heparin 1,000 Units/hr (06/04/22 1825)   heparin 1,300 Units/hr (06/05/22 0056)   nitroGLYCERIN 30 mcg/min (06/05/22 0147)   PRN Meds: acetaminophen, morphine injection, ondansetron (ZOFRAN) IV, pantoprazole   Vital Signs    Vitals:   06/05/22 0345 06/05/22 0500 06/05/22 0530 06/05/22 0600  BP: 131/81 130/80 125/77 114/86  Pulse: 75 69 70 68  Resp: '16 18 20 17  '$ Temp: 98.4 F (36.9 C)     TempSrc: Oral     SpO2: 97% 97% 95% 98%  Weight:      Height:        Intake/Output Summary (Last 24 hours) at 06/05/2022 0637 Last data filed at 06/05/2022 0147 Gross per 24 hour  Intake 43.15 ml  Output --  Net 43.15 ml      06/05/2022   12:00 AM 04/30/2022    9:47 AM 04/04/2022    3:59 AM  Last 3 Weights  Weight (lbs) 176 lb 5.9 oz 176 lb 9.6 oz 173 lb 1 oz  Weight (kg) 80 kg 80.105 kg 78.5 kg      Telemetry    Sinus rhythm - Personally Reviewed  ECG    NSR with ST-T changes consider anterior ischemia - Personally Reviewed  Physical Exam  Alert, oriented, NAD GEN: No acute distress.   Neck: No JVD Cardiac: RRR, no murmurs, rubs, or gallops.  Respiratory: Clear to auscultation bilaterally. GI: Soft, nontender, non-distended  MS: 1+ left leg edema, left transmet amputation Neuro:  Nonfocal  Psych: Normal affect   Labs     High Sensitivity Troponin:   Recent Labs  Lab 06/04/22 1517 06/04/22 1713 06/04/22 1909 06/04/22 2127  TROPONINIHS 809* 817* 654* 549*     Chemistry Recent Labs  Lab 06/04/22 1517 06/05/22 0355  NA 136 131*  K 4.0 4.3  CL 103 100  CO2 23 22  GLUCOSE 119* 157*  BUN 17 18  CREATININE 1.17 1.24  CALCIUM 9.2 8.7*  MG 1.6* 1.9  GFRNONAA >60 >60  ANIONGAP 10 9    Lipids  Recent Labs  Lab 06/05/22 0355  CHOL 188  TRIG 118  HDL 38*  LDLCALC 126*  CHOLHDL 4.9    Hematology Recent Labs  Lab 06/04/22 1517 06/05/22 0355  WBC 5.9 5.0  RBC 3.71* 3.51*  HGB 10.4* 9.9*  HCT 32.1* 30.1*  MCV 86.5 85.8  MCH 28.0 28.2  MCHC 32.4 32.9  RDW 14.0 14.1  PLT 145* 128*   Thyroid No results for input(s): "TSH", "FREET4" in the last 168 hours.  BNPNo results for input(s): "BNP", "PROBNP" in the last 168 hours.  DDimer  Recent Labs  Lab 06/04/22 1713  DDIMER 0.88*     Radiology    CT  Angio Chest PE W and/or Wo Contrast  Result Date: 06/04/2022 CLINICAL DATA:  Pulmonary embolism suspected, high probability. Chest pain and shortness of breath. EXAM: CT ANGIOGRAPHY CHEST WITH CONTRAST TECHNIQUE: Multidetector CT imaging of the chest was performed using the standard protocol during bolus administration of intravenous contrast. Multiplanar CT image reconstructions and MIPs were obtained to evaluate the vascular anatomy. RADIATION DOSE REDUCTION: This exam was performed according to the departmental dose-optimization program which includes automated exposure control, adjustment of the mA and/or kV according to patient size and/or use of iterative reconstruction technique. CONTRAST:  182m OMNIPAQUE IOHEXOL 350 MG/ML SOLN COMPARISON:  11/28/2020. FINDINGS: Cardiovascular: Heart is borderline enlarged and there is no pericardial effusion. Multi-vessel coronary artery calcifications are noted. There is atherosclerotic calcification of the aorta without evidence of aneurysm. The pulmonary  trunk is normal in caliber. No pulmonary artery filling defect is identified. Mediastinum/Nodes: Enlarged lymph nodes are present in the mediastinum and hilar regions bilaterally. No axillary lymphadenopathy. The thyroid gland, trachea, and esophagus are within normal limits. Lungs/Pleura: There are moderate bilateral pleural effusions, greater on the right than on the left. Hazy ground-glass attenuation is noted in the lungs bilaterally. Patchy atelectasis or infiltrate is noted in the lower lobes. No pneumothorax. Upper Abdomen: No acute abnormality. Musculoskeletal: Bilateral gynecomastia is noted. Degenerative changes are present in the thoracic spine. No acute osseous abnormality. Review of the MIP images confirms the above findings. IMPRESSION: 1. No evidence of pulmonary embolism. 2. Hazy attenuation in the lungs bilaterally, possible edema or infiltrate. 3. Moderate bilateral pleural effusions, greater on the right than on the left with patchy atelectasis or infiltrate at the lung bases. 4. Mediastinal and axillary lymphadenopathy, may be reactive. 5. Coronary artery calcifications. 6. Aortic atherosclerosis. Electronically Signed   By: LBrett FairyM.D.   On: 06/04/2022 21:48   DG Foot 2 Views Left  Result Date: 06/04/2022 CLINICAL DATA:  Foot ulcer.  Chest pain. EXAM: LEFT FOOT - 2 VIEW COMPARISON:  Left foot x-ray 03/25/2022 FINDINGS: There has been interval amputation of the first through fifth metatarsals and phalanges. There is soft tissue swelling of the foot. There is no acute fracture or dislocation. No definite cortical erosions are identified. There are degenerative changes of the dorsal midfoot and talonavicular joint. The bones are osteopenic. IMPRESSION: 1. 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 2. Diffuse soft tissue swelling of the foot. 3. No acute bony abnormality identified. If there is high clinical concern for osteomyelitis, an MRI is more  sensitive. Electronically Signed   By: ARonney AstersM.D.   On: 06/04/2022 20:15   VAS [place]orea[place]OWER EXTREMITY VENOUS (DVT) (7a-7p)  Result Date: 06/04/2022  Lower Venous DVT Study Patient Name:  Robert Burnett Date of Exam:   06/04/2022 Medical Rec #: [id number]        Accession #:    [id number] of Birth: [birth year]         Patient Gender: M Patient Age:   89years [facility]:      VAS [place]OWER EXTREMITY VENOUS (DVT) Referring Phys: ACampbell Stall--------------------------------------------------------------------------------  Indications: Chest pain and LLE swelling.  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 Comparison Study: No previous exams Performing Technologist: Jody Hill RVT, RDMS  Examination Guidelines: A complete evaluation includes B-mode imaging, spectral Doppler, color Doppler, and power Doppler as needed of all accessible portions of each vessel. Bilateral testing is considered an  integral part of a complete examination. Limited examinations for reoccurring indications may be performed as noted. The reflux portion of the exam is performed with the patient in reverse Trendelenburg.  +-----+---------------+---------+-----------+----------+--------------+ RIGHTCompressibilityPhasicitySpontaneityPropertiesThrombus Aging +-----+---------------+---------+-----------+----------+--------------+ CFV  Full           No       Yes                                 +-----+---------------+---------+-----------+----------+--------------+ Pulsatile doppler waveforms  +---------+---------------+---------+-----------+----------+-------------------+ LEFT     CompressibilityPhasicitySpontaneityPropertiesThrombus Aging      +---------+---------------+---------+-----------+----------+-------------------+ CFV      Full           No       Yes                                       +---------+---------------+---------+-----------+----------+-------------------+ SFJ      Full                                                             +---------+---------------+---------+-----------+----------+-------------------+ FV Prox  Full           No       Yes                                      +---------+---------------+---------+-----------+----------+-------------------+ FV Mid   Full           No       Yes                                      +---------+---------------+---------+-----------+----------+-------------------+ FV DistalFull           No       Yes                                      +---------+---------------+---------+-----------+----------+-------------------+ PFV      Full                                                             +---------+---------------+---------+-----------+----------+-------------------+ POP      Full           No       Yes                                      +---------+---------------+---------+-----------+----------+-------------------+ PTV      Full                                                             +---------+---------------+---------+-----------+----------+-------------------+  PERO                                                  Not well visualized +---------+---------------+---------+-----------+----------+-------------------+    Summary: RIGHT: - No evidence of common femoral vein obstruction.  LEFT: - There is no evidence of deep vein thrombosis in the lower extremity.  - No cystic structure found in the popliteal fossa. - Ultrasound characteristics of enlarged lymph nodes noted in the groin. Subcutaneous edema of calf and ankle.  *See table(s) above for measurements and observations.    Preliminary    DG Chest Portable 1 View  Result Date: 06/04/2022 CLINICAL DATA:  Chest pain EXAM: PORTABLE CHEST 1 VIEW COMPARISON:  03/25/2022 FINDINGS: Mild cardiomegaly. Mild, diffuse bilateral  interstitial pulmonary opacity. The visualized skeletal structures are unremarkable. IMPRESSION: Mild cardiomegaly with mild, diffuse bilateral interstitial pulmonary opacity, likely edema. No focal airspace opacity. Electronically Signed   By: Delanna Ahmadi M.D.   On: 06/04/2022 15:11     Patient Profile     61 y.o. male with diabetes, tobacco use, fatty liver disease, chronic pain, presents with NSTEMI  Assessment & Plan    NSTEMI: continue ASA, NTG, heparin. Troponin trend flat, low level. Pt at high-risk of multivessel CAD. Cardiac cath with possible PCI today. Informed consent obtained (see consult note). HTN: continue beta blocker Mixed hyperlipidemia: LDL 126, atorvastatin 80 mg ordered Diabetes: per IM team Acute diastolic CHF: echo in June with normal LVEF. Interstitial edema and pleural effusions on imaging studies. Lasix 40 mg IV x 1. Measure LVEDP at cath today to help direct therapy.  Dispo: pending cardiac cath today      For questions or updates, please contact [place] Please consult [email or web address] for contact info under        Signed, Sherren Mocha, MD  06/05/2022, 6:37 AM

## 2022-06-05 NOTE — H&P (View-Only) (Signed)
Progress Note  Patient Name: Robert Burnett Date of Encounter: 06/05/2022  [facility] Cardiologist: Sherren Mocha, MD   Subjective   Chest pain has eased off on heparin and NTG. Still with some shortness of breath.   Inpatient Medications    Scheduled Meds:  aspirin  81 mg Oral Daily   atorvastatin  80 mg Oral Daily   fentaNYL  1 patch Transdermal Q72H   finasteride  5 mg Oral Daily   insulin aspart  0-15 Units Subcutaneous TID WC   insulin detemir  5 Units Subcutaneous Daily   metoprolol tartrate  25 mg Oral BID    morphine injection  4 mg Intravenous Once   sodium chloride flush  3 mL Intravenous Q12H   Continuous Infusions:  heparin 1,000 Units/hr (06/04/22 1825)   heparin 1,300 Units/hr (06/05/22 0056)   nitroGLYCERIN 30 mcg/min (06/05/22 0147)   PRN Meds: acetaminophen, morphine injection, ondansetron (ZOFRAN) IV, pantoprazole   Vital Signs    Vitals:   06/05/22 0345 06/05/22 0500 06/05/22 0530 06/05/22 0600  BP: 131/81 130/80 125/77 114/86  Pulse: 75 69 70 68  Resp: '16 18 20 17  '$ Temp: 98.4 F (36.9 C)     TempSrc: Oral     SpO2: 97% 97% 95% 98%  Weight:      Height:        Intake/Output Summary (Last 24 hours) at 06/05/2022 0637 Last data filed at 06/05/2022 0147 Gross per 24 hour  Intake 43.15 ml  Output --  Net 43.15 ml      06/05/2022   12:00 AM 04/30/2022    9:47 AM 04/04/2022    3:59 AM  Last 3 Weights  Weight (lbs) 176 lb 5.9 oz 176 lb 9.6 oz 173 lb 1 oz  Weight (kg) 80 kg 80.105 kg 78.5 kg      Telemetry    Sinus rhythm - Personally Reviewed  ECG    NSR with ST-T changes consider anterior ischemia - Personally Reviewed  Physical Exam  Alert, oriented, NAD GEN: No acute distress.   Neck: No JVD Cardiac: RRR, no murmurs, rubs, or gallops.  Respiratory: Clear to auscultation bilaterally. GI: Soft, nontender, non-distended  MS: 1+ left leg edema, left transmet amputation Neuro:  Nonfocal  Psych: Normal affect   Labs     High Sensitivity Troponin:   Recent Labs  Lab 06/04/22 1517 06/04/22 1713 06/04/22 1909 06/04/22 2127  TROPONINIHS 809* 817* 654* 549*     Chemistry Recent Labs  Lab 06/04/22 1517 06/05/22 0355  NA 136 131*  K 4.0 4.3  CL 103 100  CO2 23 22  GLUCOSE 119* 157*  BUN 17 18  CREATININE 1.17 1.24  CALCIUM 9.2 8.7*  MG 1.6* 1.9  GFRNONAA >60 >60  ANIONGAP 10 9    Lipids  Recent Labs  Lab 06/05/22 0355  CHOL 188  TRIG 118  HDL 38*  LDLCALC 126*  CHOLHDL 4.9    Hematology Recent Labs  Lab 06/04/22 1517 06/05/22 0355  WBC 5.9 5.0  RBC 3.71* 3.51*  HGB 10.4* 9.9*  HCT 32.1* 30.1*  MCV 86.5 85.8  MCH 28.0 28.2  MCHC 32.4 32.9  RDW 14.0 14.1  PLT 145* 128*   Thyroid No results for input(s): "TSH", "FREET4" in the last 168 hours.  BNPNo results for input(s): "BNP", "PROBNP" in the last 168 hours.  DDimer  Recent Labs  Lab 06/04/22 1713  DDIMER 0.88*     Radiology    CT  Angio Chest PE W and/or Wo Contrast  Result Date: 06/04/2022 CLINICAL DATA:  Pulmonary embolism suspected, high probability. Chest pain and shortness of breath. EXAM: CT ANGIOGRAPHY CHEST WITH CONTRAST TECHNIQUE: Multidetector CT imaging of the chest was performed using the standard protocol during bolus administration of intravenous contrast. Multiplanar CT image reconstructions and MIPs were obtained to evaluate the vascular anatomy. RADIATION DOSE REDUCTION: This exam was performed according to the departmental dose-optimization program which includes automated exposure control, adjustment of the mA and/or kV according to patient size and/or use of iterative reconstruction technique. CONTRAST:  117m OMNIPAQUE IOHEXOL 350 MG/ML SOLN COMPARISON:  11/28/2020. FINDINGS: Cardiovascular: Heart is borderline enlarged and there is no pericardial effusion. Multi-vessel coronary artery calcifications are noted. There is atherosclerotic calcification of the aorta without evidence of aneurysm. The pulmonary  trunk is normal in caliber. No pulmonary artery filling defect is identified. Mediastinum/Nodes: Enlarged lymph nodes are present in the mediastinum and hilar regions bilaterally. No axillary lymphadenopathy. The thyroid gland, trachea, and esophagus are within normal limits. Lungs/Pleura: There are moderate bilateral pleural effusions, greater on the right than on the left. Hazy ground-glass attenuation is noted in the lungs bilaterally. Patchy atelectasis or infiltrate is noted in the lower lobes. No pneumothorax. Upper Abdomen: No acute abnormality. Musculoskeletal: Bilateral gynecomastia is noted. Degenerative changes are present in the thoracic spine. No acute osseous abnormality. Review of the MIP images confirms the above findings. IMPRESSION: 1. No evidence of pulmonary embolism. 2. Hazy attenuation in the lungs bilaterally, possible edema or infiltrate. 3. Moderate bilateral pleural effusions, greater on the right than on the left with patchy atelectasis or infiltrate at the lung bases. 4. Mediastinal and axillary lymphadenopathy, may be reactive. 5. Coronary artery calcifications. 6. Aortic atherosclerosis. Electronically Signed   By: LBrett FairyM.D.   On: 06/04/2022 21:48   DG Foot 2 Views Left  Result Date: 06/04/2022 CLINICAL DATA:  Foot ulcer.  Chest pain. EXAM: LEFT FOOT - 2 VIEW COMPARISON:  Left foot x-ray 03/25/2022 FINDINGS: There has been interval amputation of the first through fifth metatarsals and phalanges. There is soft tissue swelling of the foot. There is no acute fracture or dislocation. No definite cortical erosions are identified. There are degenerative changes of the dorsal midfoot and talonavicular joint. The bones are osteopenic. IMPRESSION: 1. 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 2. Diffuse soft tissue swelling of the foot. 3. No acute bony abnormality identified. If there is high clinical concern for osteomyelitis, an MRI is more  sensitive. Electronically Signed   By: ARonney AstersM.D.   On: 06/04/2022 20:15   VAS [place]OWER EXTREMITY VENOUS (DVT) (7a-7p)  Result Date: 06/04/2022  Lower Venous DVT Study Patient Name:  Robert Burnett Date of Exam:   06/04/2022 Medical Rec #: [id number]        Accession #:    [id number] of Birth: [id number]         Patient Gender: M Patient Age:   89years [facility]      VAS [place]OWER EXTREMITY VENOUS (DVT) Referring Phys: ACampbell Stall--------------------------------------------------------------------------------  Indications: Chest pain and LLE swelling.  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 Comparison Study: No previous exams Performing Technologist: Jody Hill RVT, RDMS  Examination Guidelines: A complete evaluation includes B-mode imaging, spectral Doppler, color Doppler, and power Doppler as needed of all accessible portions of each vessel. Bilateral testing is considered an  integral part of a complete examination. Limited examinations for reoccurring indications may be performed as noted. The reflux portion of the exam is performed with the patient in reverse Trendelenburg.  +-----+---------------+---------+-----------+----------+--------------+ RIGHTCompressibilityPhasicitySpontaneityPropertiesThrombus Aging +-----+---------------+---------+-----------+----------+--------------+ CFV  Full           No       Yes                                 +-----+---------------+---------+-----------+----------+--------------+ Pulsatile doppler waveforms  +---------+---------------+---------+-----------+----------+-------------------+ LEFT     CompressibilityPhasicitySpontaneityPropertiesThrombus Aging      +---------+---------------+---------+-----------+----------+-------------------+ CFV      Full           No       Yes                                       +---------+---------------+---------+-----------+----------+-------------------+ SFJ      Full                                                             +---------+---------------+---------+-----------+----------+-------------------+ FV Prox  Full           No       Yes                                      +---------+---------------+---------+-----------+----------+-------------------+ FV Mid   Full           No       Yes                                      +---------+---------------+---------+-----------+----------+-------------------+ FV DistalFull           No       Yes                                      +---------+---------------+---------+-----------+----------+-------------------+ PFV      Full                                                             +---------+---------------+---------+-----------+----------+-------------------+ POP      Full           No       Yes                                      +---------+---------------+---------+-----------+----------+-------------------+ PTV      Full                                                             +---------+---------------+---------+-----------+----------+-------------------+  PERO                                                  Not well visualized +---------+---------------+---------+-----------+----------+-------------------+    Summary: RIGHT: - No evidence of common femoral vein obstruction.  LEFT: - There is no evidence of deep vein thrombosis in the lower extremity.  - No cystic structure found in the popliteal fossa. - Ultrasound characteristics of enlarged lymph nodes noted in the groin. Subcutaneous edema of calf and ankle.  *See table(s) above for measurements and observations.    Preliminary    DG Chest Portable 1 View  Result Date: 06/04/2022 CLINICAL DATA:  Chest pain EXAM: PORTABLE CHEST 1 VIEW COMPARISON:  03/25/2022 FINDINGS: Mild cardiomegaly. Mild, diffuse bilateral  interstitial pulmonary opacity. The visualized skeletal structures are unremarkable. IMPRESSION: Mild cardiomegaly with mild, diffuse bilateral interstitial pulmonary opacity, likely edema. No focal airspace opacity. Electronically Signed   By: Delanna Ahmadi M.D.   On: 06/04/2022 15:11     Patient Profile     61 y.o. male with diabetes, tobacco use, fatty liver disease, chronic pain, presents with NSTEMI  Assessment & Plan    NSTEMI: continue ASA, NTG, heparin. Troponin trend flat, low level. Pt at high-risk of multivessel CAD. Cardiac cath with possible PCI today. Informed consent obtained (see consult note). HTN: continue beta blocker Mixed hyperlipidemia: LDL 126, atorvastatin 80 mg ordered Diabetes: per IM team Acute diastolic CHF: echo in June with normal LVEF. Interstitial edema and pleural effusions on imaging studies. Lasix 40 mg IV x 1. Measure LVEDP at cath today to help direct therapy.  Dispo: pending cardiac cath today      For questions or updates, please contact [place] Please consult [email or web address] for contact info under        Signed, Sherren Mocha, MD  06/05/2022, 6:37 AM

## 2022-06-05 NOTE — Progress Notes (Signed)
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 awaiting for cardiac catheter today  Data reviewed: WBC 5.0, hemoglobin 9.9, platelet 128 Sodium 131, a.m. fasting glucose 157, creatinine 1.24, mag 1.9 LDL 126   CTA chest: 1. No evidence of pulmonary embolism. 2. Hazy attenuation in the lungs bilaterally, possible edema or infiltrate. 3. Moderate bilateral pleural effusions, greater on the right than on the left with patchy atelectasis or infiltrate at the lung bases. 4. Mediastinal and axillary lymphadenopathy, may be reactive. 5. Coronary artery calcifications. 6. Aortic atherosclerosis.  Left foot x ray:  1. 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 2. Diffuse soft tissue swelling of the foot. 3. No acute bony abnormality identified. If there is high clinical concern for osteomyelitis, an MRI is more sensitive.   Assessment & Plan:  Principal Problem:   NSTEMI (non-ST elevated myocardial infarction) ([place]) Active Problems:   Hypomagnesemia   Diabetic ulcer of left foot (HCC)   Type 2 diabetes mellitus with complication, with long-term current use of insulin (HCC)   Hypertension   Hyperlipidemia   Chest pain  * NSTEMI (non-ST elevated myocardial infarction) ([place]) Going to have cardiac cath today Plan per cardiology  Hyponatremia From volume overload?  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 New wound on bottom of his foot that he noticed maybe a week ago Check foot xray, no erythema, drainage or evidence of infection at this point Cbc with no leukocytosis ABI normal in 03/2022 DVT doppler negative for DVT of LLE  Wound care consult /podiatry consult    Type 2 diabetes mellitus with complication, with long-term current use of insulin (HCC) A1C of 14.2 in 03/2022, uncontrolled Adjust insulin  Hypertension HLD    Chronic back pain Continue home med fentanyl patch, prn analgesics      I have Reviewed nursing notes, Vitals, pain scores, I/o's, Lab results and  imaging results since pt's last encounter, details please see discussion above  I ordered the following labs:  Unresulted Labs (From admission, onward)     Start     Ordered   06/06/22 2505  Basic metabolic panel  Tomorrow morning,   R        06/05/22 0912   06/06/22 0500  CBC  Tomorrow morning,   R        06/05/22 1423   06/05/22 0500  Lipoprotein A (LPA)  Tomorrow morning,   R        06/04/22 1911             DVT prophylaxis: heparin injection 5,000 Units Start: 06/05/22 2200 SCD's Start: 06/05/22 1423   Code Status:   Code Status: Full Code  Family Communication: Patient Disposition:    Dispo: The  patient is from: Home              Anticipated d/c is to: TBD              Anticipated d/c date is: Not medically ready to discharge  Antimicrobials:    Anti-infectives (From admission, onward)    None          Objective: Vitals:   06/05/22 1623 06/05/22 1711 06/05/22 1735 06/05/22 1811  BP: 118/82 126/74  138/83  Pulse: 81 76 90 97  Resp: '17 15 18 18  '$ Temp: 97.6 F (36.4 C)     TempSrc: Oral     SpO2:  98% 97% 96%  Weight:      Height:        Intake/Output  Summary (Last 24 hours) at 06/05/2022 1921 Last data filed at 06/05/2022 6301 Gross per 24 hour  Intake 348.65 ml  Output 650 ml  Net -301.35 ml   Filed Weights   06/05/22 0000  Weight: 80 kg    Examination:  General exam: alert, awake, communicative,calm, NAD Respiratory system: Clear to auscultation. Respiratory effort normal. Cardiovascular system:  RRR.  Gastrointestinal system: Abdomen is nondistended, soft and nontender.  Normal bowel sounds heard. Central nervous system: Alert and oriented. No focal neurological deficits. Extremities: Left foot wound wrapped, left lower extremity trace pitting edema Skin: No rashes, lesions or ulcers Psychiatry: Judgement and insight appear normal. Mood & affect appropriate.     Data Reviewed: I have personally reviewed  labs and visualized  imaging studies since the last encounter and formulate the plan        Scheduled Meds:  aspirin  81 mg Oral Daily   [START ON 06/06/2022] aspirin  81 mg Oral Daily   atorvastatin  80 mg Oral Daily   [START ON 06/06/2022] fentaNYL  1 patch Transdermal Q72H   finasteride  5 mg Oral Daily   heparin  5,000 Units Subcutaneous Q8H   insulin aspart  0-15 Units Subcutaneous TID WC   insulin detemir  5 Units Subcutaneous Daily   lidocaine  1 patch Transdermal Q24H   metoprolol tartrate  25 mg Oral BID    morphine injection  4 mg Intravenous Once   sodium chloride flush  3 mL Intravenous Q12H   sodium chloride flush  3 mL Intravenous Q12H   ticagrelor  90 mg Oral BID   Continuous Infusions:  sodium chloride     nitroGLYCERIN 45 mcg/min (06/05/22 1130)     LOS: 1 day     Florencia Reasons, MD PhD FACP [facility]  Available via Epic secure chat 7am-7pm for nonurgent issues Please page for urgent issues To page the attending provider between 7A-7P or the covering provider during after hours 7P-7A, please log into the web site [email or web address] and access using universal [place] password for that  web site. If you do not have the password, please call the hospital operator.    06/05/2022, 7:21 PM

## 2022-06-05 NOTE — Consult Note (Signed)
[facility] Nurse Consult Note: Reason for Consult: Left foot wound, history of left transmet 6/23. Last seen by Dr. Sherryle Lis 7/20, no mention of this ulcer. Noted by hospitalist notes this admission to be found by patient about 2 weeks ago.  Xray negative for osteomyelitis, however patient does have a history of osteo.  Wound type: Neuropathic with history of PAD Pressure Injury POA: NA Measurement: see nursing flowsheets, patient left for cath lab prior to my arrival  Wound TDD:UKGURK centrally, pale pink along perimeter of wound bed 75% yellow/25% pink Drainage (amount, consistency, odor) not observed  Periwound:hyperkeratotic  Dressing procedure/placement/frequency: Medihoney for antimicrobial effects and debridement. Top with dry dressing. Change daily. Offloading with orthotic if patient has with him.  Needs evaluation for new orthotic outpatient.   Would suggest consultation with podietry while inpatient, new onset wound with PAD and DM and recent history of transmet.   Re consult if needed, will not follow at this time. Thanks  Matricia Begnaud [employer], RN,CWOCN, CNS, [place] [phone])

## 2022-06-06 ENCOUNTER — Telehealth (HOSPITAL_COMMUNITY): Payer: Self-pay | Admitting: Pharmacy Technician

## 2022-06-06 ENCOUNTER — Other Ambulatory Visit (HOSPITAL_COMMUNITY): Payer: Self-pay

## 2022-06-06 DIAGNOSIS — I214 Non-ST elevation (NSTEMI) myocardial infarction: Secondary | ICD-10-CM | POA: Diagnosis not present

## 2022-06-06 LAB — CBC
HCT: 29.3 % — ABNORMAL LOW (ref 39.0–52.0)
Hemoglobin: 9.7 g/dL — ABNORMAL LOW (ref 13.0–17.0)
MCH: 28.4 pg (ref 26.0–34.0)
MCHC: 33.1 g/dL (ref 30.0–36.0)
MCV: 85.9 fL (ref 80.0–100.0)
Platelets: 140 10*3/uL — ABNORMAL LOW (ref 150–400)
RBC: 3.41 MIL/uL — ABNORMAL LOW (ref 4.22–5.81)
RDW: 14.2 % (ref 11.5–15.5)
WBC: 5.4 10*3/uL (ref 4.0–10.5)
nRBC: 0 % (ref 0.0–0.2)

## 2022-06-06 LAB — BASIC METABOLIC PANEL
Anion gap: 14 (ref 5–15)
BUN: 17 mg/dL (ref 8–23)
CO2: 22 mmol/L (ref 22–32)
Calcium: 8.9 mg/dL (ref 8.9–10.3)
Chloride: 97 mmol/L — ABNORMAL LOW (ref 98–111)
Creatinine, Ser: 1.46 mg/dL — ABNORMAL HIGH (ref 0.61–1.24)
GFR, Estimated: 54 mL/min — ABNORMAL LOW (ref >60)
Glucose, Bld: 168 mg/dL — ABNORMAL HIGH (ref 70–99)
Potassium: 3.8 mmol/L (ref 3.5–5.1)
Sodium: 133 mmol/L — ABNORMAL LOW (ref 135–145)

## 2022-06-06 LAB — URINALYSIS, ROUTINE W REFLEX MICROSCOPIC
Bacteria, UA: NONE SEEN
Bilirubin Urine: NEGATIVE
Glucose, UA: NEGATIVE mg/dL
Hgb urine dipstick: NEGATIVE
Ketones, ur: NEGATIVE mg/dL
Leukocytes,Ua: NEGATIVE
Nitrite: NEGATIVE
Protein, ur: 30 mg/dL — AB
Specific Gravity, Urine: 1.014 (ref 1.005–1.030)
pH: 7 (ref 5.0–8.0)

## 2022-06-06 LAB — GLUCOSE, CAPILLARY
Glucose-Capillary: 188 mg/dL — ABNORMAL HIGH (ref 70–99)
Glucose-Capillary: 164 mg/dL — ABNORMAL HIGH (ref 70–99)
Glucose-Capillary: 172 mg/dL — ABNORMAL HIGH (ref 70–99)
Glucose-Capillary: 187 mg/dL — ABNORMAL HIGH (ref 70–99)

## 2022-06-06 LAB — LIPOPROTEIN A (LPA): Lipoprotein (a): 108.5 nmol/L — ABNORMAL HIGH (ref <75.0)

## 2022-06-06 MED ORDER — EZETIMIBE 10 MG PO TABS
10.0000 mg | ORAL_TABLET | Freq: Every day | ORAL | Status: DC
  Administered 2022-06-06 – 2022-06-08 (×3): 10 mg via ORAL
  Filled 2022-06-06 (×3): qty 1

## 2022-06-06 MED ORDER — CEPHALEXIN 500 MG PO CAPS
500.0000 mg | ORAL_CAPSULE | Freq: Three times a day (TID) | ORAL | 0 refills | Status: AC
  Filled 2022-06-06: qty 21, 7d supply, fill #0

## 2022-06-06 MED ORDER — INSULIN ASPART PROT & ASPART (70-30 MIX) 100 UNIT/ML ~~LOC~~ SUSP
10.0000 [IU] | Freq: Two times a day (BID) | SUBCUTANEOUS | Status: DC
  Administered 2022-06-06 – 2022-06-08 (×4): 10 [IU] via SUBCUTANEOUS
  Filled 2022-06-06: qty 10

## 2022-06-06 MED ORDER — APIXABAN 5 MG PO TABS
5.0000 mg | ORAL_TABLET | Freq: Two times a day (BID) | ORAL | Status: DC
  Administered 2022-06-06 – 2022-06-08 (×5): 5 mg via ORAL
  Filled 2022-06-06 (×5): qty 1

## 2022-06-06 MED ORDER — METOPROLOL SUCCINATE ER 25 MG PO TB24
75.0000 mg | ORAL_TABLET | Freq: Every day | ORAL | 0 refills | Status: DC
  Filled 2022-06-06: qty 90, 30d supply, fill #0

## 2022-06-06 MED ORDER — APIXABAN 5 MG PO TABS
5.0000 mg | ORAL_TABLET | Freq: Two times a day (BID) | ORAL | 0 refills | Status: DC
  Filled 2022-06-06: qty 60, 30d supply, fill #0

## 2022-06-06 MED ORDER — METOPROLOL SUCCINATE ER 25 MG PO TB24
25.0000 mg | ORAL_TABLET | Freq: Once | ORAL | Status: AC
  Administered 2022-06-06: 25 mg via ORAL
  Filled 2022-06-06: qty 1

## 2022-06-06 MED ORDER — CLOPIDOGREL BISULFATE 75 MG PO TABS
75.0000 mg | ORAL_TABLET | Freq: Every day | ORAL | 0 refills | Status: DC
  Filled 2022-06-06: qty 30, 30d supply, fill #0

## 2022-06-06 MED ORDER — METOPROLOL SUCCINATE ER 50 MG PO TB24
50.0000 mg | ORAL_TABLET | Freq: Every day | ORAL | Status: DC
Start: 2022-06-06 — End: 2022-06-06
  Administered 2022-06-06: 50 mg via ORAL
  Filled 2022-06-06: qty 1

## 2022-06-06 MED ORDER — PANTOPRAZOLE SODIUM 40 MG PO TBEC
40.0000 mg | DELAYED_RELEASE_TABLET | Freq: Every day | ORAL | Status: DC
  Administered 2022-06-06 – 2022-06-08 (×3): 40 mg via ORAL
  Filled 2022-06-06 (×3): qty 1

## 2022-06-06 MED ORDER — MELATONIN 5 MG PO TABS
10.0000 mg | ORAL_TABLET | Freq: Every evening | ORAL | Status: DC | PRN
  Administered 2022-06-06 – 2022-06-08 (×3): 10 mg via ORAL
  Filled 2022-06-06 (×3): qty 2

## 2022-06-06 MED ORDER — EZETIMIBE 10 MG PO TABS
10.0000 mg | ORAL_TABLET | Freq: Every day | ORAL | 0 refills | Status: DC
  Filled 2022-06-06: qty 30, 30d supply, fill #0

## 2022-06-06 MED ORDER — FENTANYL 75 MCG/HR TD PT72
1.0000 | MEDICATED_PATCH | TRANSDERMAL | 0 refills | Status: DC
  Filled 2022-06-06 (×2): qty 5, 15d supply, fill #0

## 2022-06-06 MED ORDER — CLOPIDOGREL BISULFATE 75 MG PO TABS
75.0000 mg | ORAL_TABLET | Freq: Every day | ORAL | Status: DC
  Administered 2022-06-07 – 2022-06-08 (×2): 75 mg via ORAL
  Filled 2022-06-06 (×2): qty 1

## 2022-06-06 MED ORDER — ASPIRIN 81 MG PO TBEC
81.0000 mg | DELAYED_RELEASE_TABLET | Freq: Every day | ORAL | 0 refills | Status: DC
  Filled 2022-06-06: qty 30, 30d supply, fill #0

## 2022-06-06 MED ORDER — METOPROLOL SUCCINATE ER 50 MG PO TB24: 75.0000 mg | ORAL_TABLET | Freq: Every day | ORAL | Status: DC

## 2022-06-06 MED ORDER — CLOPIDOGREL BISULFATE 75 MG PO TABS
300.0000 mg | ORAL_TABLET | Freq: Once | ORAL | Status: AC
  Administered 2022-06-06: 300 mg via ORAL
  Filled 2022-06-06: qty 4

## 2022-06-06 MED ORDER — PANTOPRAZOLE SODIUM 40 MG PO TBEC
40.0000 mg | DELAYED_RELEASE_TABLET | Freq: Every day | ORAL | 0 refills | Status: DC
  Filled 2022-06-06: qty 30, 30d supply, fill #0

## 2022-06-06 NOTE — Progress Notes (Signed)
Pt is unable to walk due to nonweightbearing on left foot and does not have boot yet. In and out of afib at rest and activity (just came back from chair).   Discussed with pt MI, stent, antiplatelet importance, diet, NTG, and tobacco cessation. Pt sts he quit smoking his pipe 1 week ago and is doing well.  Did not give exercise gl due to wound and also pt is limited due to his arthritis. Will place referral for [facility] in hopes that he can do seated exercise.  [place] BS, ACSM-CEP 06/06/2022 11:42 AM

## 2022-06-06 NOTE — Progress Notes (Addendum)
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 for diabetic foot ulcer/necrotizing infection and osteomyelitis, anemia of chronic disease presented to the ED well EMS due to chest pain and short of breath, found to have troponin elevation, admitted for non-STEMI   Subjective:  S/p LAD stent yesterday Appears to be in PAF since  3am, new diagnosis, no prior history   Currently denies chest pain, sob has improved, he is excited about the possible discharge tomorrow    Cr increased to 1.46 Sodium improved at 133 Cbc stable, platelet improved  fasting glucose 168 LDL 126    Assessment & Plan:  Principal Problem:   NSTEMI (non-ST elevated myocardial infarction) ([place]) Active Problems:   Hypomagnesemia   Diabetic ulcer of left foot (HCC)   Type 2 diabetes mellitus with complication, with long-term current use of insulin (HCC)   Hypertension   Hyperlipidemia   Chest pain  NSTEMI (non-ST elevated myocardial infarction) ([place]) Ischemic cardiomyopathy , LVEF 40-45% PAF, new diagnosis, appear asymptomatic from this Aortic atherosclerosis HTN HLD S/p stent to LAD on 8/17 Aspirin 81 mg daily x30 days then stop Discontinue Brilinta Clopidogrel 300 mg loading dose this evening followed by 75 mg daily Apixaban 5 mg twice daily Empiric PPI due to bleeding risk of triple therapy, start pantoprazole 40 mg daily Increase metoprolol succinate to 75 mg daily with hopes of reducing A-fib burden  Plan per cardiology  AKI on CKDII Cr up to 1.46, possible due to contrast Will get ua Repeat bmp in am  Hyponatremia Likely From volume overload   Does has signs of pulmonary edema on initial CTA, no significant pitting edema lower extremity except left leg As needed  Lasix Sodium improved on repeat   Hypomagnesemia repleted in ED  Type 2 diabetes mellitus with complication, with long-term current use of insulin (HCC) A1C of 14.2 in 03/2022, uncontrolled Reports on insulin 70/30 at home  On lower dose here due to npo for procedures, gradually increase long acting insulin , monitor blood glucoses F/u with pcp Dr Margarita Rana  Diabetic ulcer of left foot ([place]) S/p transmetatarsal amputation of left foot  in 03/2022 ABI normal in 03/2022 New wound on bottom of his foot that he noticed maybe a week ago  foot xray diffuse soft tissues swelling DVT doppler negative for DVT of LLE  Seen by Wound care consult /podiatry consult, input appreciated, started on oral abx for possible soft tissue infection, cam boot, NWB, outpatient follow with podiatry recommended  (detail please refer to podiatry Dr Clayborne Artist note from 8/17)    Chronic back pain home med fentanyl patch 74mg q72hrs Reports has run out fentanyl patch due to previous pcp has changed job to work with [employer]  he requests fentanyl patch prescription at discharge, states has been on it for 986yr he has not followed with a pain clinic since his prior pcp took care of his prescription many years ago.  I wrote 5patches should last him for 15days, pharmacy is helping getting it delivered prior to discharge ( addendum, insurance denied fetanyl, patient states he has been self pay in the past to get the patches) I also sent a staff message to his new pcp Dr NeMargarita Ranaegarding long term fentanyl patch needs Patient is advised to talk to  his physicians to see who is willing to give long term prescription, he expressed understanding.      I have Reviewed nursing notes, Vitals, pain scores, I/o's, Lab results and  imaging results since pt's last encounter, details please see discussion above  I ordered the following labs:  Unresulted Labs (From admission, onward)     Start     Ordered   06/07/22 9024  Basic  metabolic panel  Tomorrow morning,   R       Question:  Specimen collection method  Answer:  Lab=Lab collect   06/06/22 0811   06/07/22 0500  CBC with Differential/Platelet  Tomorrow morning,   R       Question:  Specimen collection method  Answer:  Lab=Lab collect   06/06/22 1305             DVT prophylaxis: SCD's Start: 06/05/22 1423 apixaban (ELIQUIS) tablet 5 mg   Code Status:   Code Status: Full Code  Family Communication: Patient Disposition:    Dispo: The patient is from: Home              Anticipated d/c is to: home              Anticipated d/c date is: monitor cr, home once cleared by cardiology, possible on 8/19, discharge heart meds sent to [facility] pharmacy on 8/18 in preparation for possible d/c on 8/19  Antimicrobials:    Anti-infectives (From admission, onward)    Start     Dose/Rate Route Frequency Ordered Stop   06/05/22 2200  cephALEXin (KEFLEX) capsule 500 mg        500 mg Oral Every 8 hours 06/05/22 1934 06/12/22 2159          Objective: Vitals:   06/06/22 0500 06/06/22 0827 06/06/22 1204 06/06/22 1237  BP: 133/88 131/74 137/85 138/87  Pulse: 80 86 78 71  Resp: '19 19  17  '$ Temp: 97.8 F (36.6 C) 98.4 F (36.9 C)  98.6 F (37 C)  TempSrc:  Oral  Oral  SpO2:      Weight:      Height:        Intake/Output Summary (Last 24 hours) at 06/06/2022 1306 Last data filed at 06/06/2022 1206 Gross per 24 hour  Intake 240 ml  Output 475 ml  Net -235 ml   Filed Weights   06/05/22 0000 06/06/22 0444  Weight: 80 kg 83 kg    Examination:  General exam: alert, awake, communicative,calm, NAD Respiratory system: Clear to auscultation. Respiratory effort normal. Cardiovascular system:  RRR.  Gastrointestinal system: Abdomen is nondistended, soft and nontender.  Normal bowel sounds heard. Central nervous system: Alert and oriented. No focal neurological deficits. Extremities: Left foot wound wrapped, left lower extremity trace pitting  edema Skin: No rashes, lesions or ulcers Psychiatry: Judgement and insight appear normal. Mood & affect appropriate.     Data Reviewed: I have personally reviewed  labs and visualized  imaging studies since the last encounter and formulate the plan        Scheduled Meds:  apixaban  5 mg Oral BID   aspirin  81 mg Oral Daily   atorvastatin  80 mg Oral Daily   cephALEXin  500 mg Oral Q8H   [START ON 06/07/2022] clopidogrel  75 mg Oral Daily   ezetimibe  10 mg Oral Daily   fentaNYL  1 patch Transdermal Q72H   finasteride  5 mg Oral Daily   insulin aspart  0-15  Units Subcutaneous TID AC & HS   insulin detemir  5 Units Subcutaneous Daily   lidocaine  1 patch Transdermal Q24H   [START ON 06/07/2022] metoprolol succinate  75 mg Oral Daily   pantoprazole  40 mg Oral Daily   sodium chloride flush  3 mL Intravenous Q12H   sodium chloride flush  3 mL Intravenous Q12H   Continuous Infusions:  sodium chloride       LOS: 2 days     Florencia Reasons, MD PhD FACP [facility]  Available via Epic secure chat 7am-7pm for nonurgent issues Please page for urgent issues To page the attending provider between 7A-7P or the covering provider during after hours 7P-7A, please log into the web site [email or web address] and access using universal [place] password for that web site. If you do not have the password, please call the hospital operator.    06/06/2022, 1:06 PM

## 2022-06-06 NOTE — Progress Notes (Signed)
Around [facility] called to say pt looked like he converted to Afib. Pt HR was elevated but otherwise asymtomatic. Did look like he was in Afib on monitor and having short burst of SVT. EKG taken at 0318 showed NSR. On-call cardiologist notified.

## 2022-06-06 NOTE — Progress Notes (Addendum)
Progress Note  Patient Name: Robert Burnett Date of Encounter: 06/06/2022  [facility] Cardiologist: Sherren Mocha, MD   Subjective   Patient states he had significant SOB yesterday, that has much improved after receiving diuretic and voided 5 times. He states his severe chest pain had resolved. He reports baseline back pain from his spine issue. He is agreeable with medical therapy for his cardiac disease. He has been taking insulin for 1 month and came off metformin as it was not working.   Inpatient Medications    Scheduled Meds:  apixaban  5 mg Oral BID   aspirin  81 mg Oral Daily   atorvastatin  80 mg Oral Daily   cephALEXin  500 mg Oral Q8H   clopidogrel  300 mg Oral Once   [START ON 06/07/2022] clopidogrel  75 mg Oral Daily   ezetimibe  10 mg Oral Daily   fentaNYL  1 patch Transdermal Q72H   finasteride  5 mg Oral Daily   insulin aspart  0-15 Units Subcutaneous TID AC & HS   insulin detemir  5 Units Subcutaneous Daily   lidocaine  1 patch Transdermal Q24H   metoprolol succinate  25 mg Oral Once   [START ON 06/07/2022] metoprolol succinate  75 mg Oral Daily   pantoprazole  40 mg Oral Daily   sodium chloride flush  3 mL Intravenous Q12H   sodium chloride flush  3 mL Intravenous Q12H   Continuous Infusions:  sodium chloride     PRN Meds: sodium chloride, acetaminophen, acetaminophen, melatonin, morphine injection, ondansetron (ZOFRAN) IV, ondansetron (ZOFRAN) IV, oxyCODONE, pantoprazole, sodium chloride flush   Vital Signs    Vitals:   06/06/22 0041 06/06/22 0444 06/06/22 0500 06/06/22 0827  BP: (!) 141/74 133/88 133/88 131/74  Pulse: 80 80 80 86  Resp: '19 19 19 19  '$ Temp: 98.3 F (36.8 C) 97.8 F (36.6 C) 97.8 F (36.6 C) 98.4 F (36.9 C)  TempSrc: Oral Oral  Oral  SpO2: 98%     Weight:  83 kg    Height:        Intake/Output Summary (Last 24 hours) at 06/06/2022 1055 Last data filed at 06/06/2022 0300 Gross per 24 hour  Intake 240 ml  Output --   Net 240 ml      06/06/2022    4:44 AM 06/05/2022   12:00 AM 04/30/2022    9:47 AM  Last 3 Weights  Weight (lbs) 182 lb 15.7 oz 176 lb 5.9 oz 176 lb 9.6 oz  Weight (kg) 83 kg 80 kg 80.105 kg      Telemetry    Paroxysmal A fib with RVR 120s, spontaneous conversion to SR 70s - Personally Reviewed  ECG    Sinus rhythm, anterior TWI old - Personally Reviewed  Physical Exam   GEN: No acute distress.   Neck: No JVD Cardiac: RRR, no murmurs, rubs, or gallops.  Respiratory: Clear to auscultation bilaterally. GI: Soft, nontender, non-distended  MS: No edema; No deformity. Neuro:  Nonfocal  Psych: Normal affect  Right radial site with bruise, no hematoma, radial pulse 2+, no neurovascular deficit   Labs    High Sensitivity Troponin:   Recent Labs  Lab 06/04/22 1517 06/04/22 1713 06/04/22 1909 06/04/22 2127  TROPONINIHS 809* 817* 654* 549*     Chemistry Recent Labs  Lab 06/04/22 1517 06/05/22 0355 06/06/22 0338  NA 136 131* 133*  K 4.0 4.3 3.8  CL 103 100 97*  CO2 23 22 22  GLUCOSE 119* 157* 168*  BUN '17 18 17  '$ CREATININE 1.17 1.24 1.46*  CALCIUM 9.2 8.7* 8.9  MG 1.6* 1.9  --   GFRNONAA >60 >60 54*  ANIONGAP '10 9 14    '$ Lipids  Recent Labs  Lab 06/05/22 0355  CHOL 188  TRIG 118  HDL 38*  LDLCALC 126*  CHOLHDL 4.9    Hematology Recent Labs  Lab 06/04/22 1517 06/05/22 0355 06/06/22 0338  WBC 5.9 5.0 5.4  RBC 3.71* 3.51* 3.41*  HGB 10.4* 9.9* 9.7*  HCT 32.1* 30.1* 29.3*  MCV 86.5 85.8 85.9  MCH 28.0 28.2 28.4  MCHC 32.4 32.9 33.1  RDW 14.0 14.1 14.2  PLT 145* 128* 140*   Thyroid No results for input(s): "TSH", "FREET4" in the last 168 hours.  BNPNo results for input(s): "BNP", "PROBNP" in the last 168 hours.  DDimer  Recent Labs  Lab 06/04/22 1713  DDIMER 0.88*     Radiology    ECHOCARDIOGRAM LIMITED  Result Date: 06/05/2022    ECHOCARDIOGRAM LIMITED REPORT   Patient Name:   MATHEU PLOEGER Date of Exam: 06/05/2022 Medical Rec #:   [id number]        Height:       72.0 in Accession #:    [id number]       Weight:       176.4 lb Date of Birth:  [birth year]         BSA:          2.020 m Patient Age:    63 years         BP:           122/77 mmHg Patient Gender: M                HR:           79 bpm. Exam Location:  Inpatient Procedure: Limited Echo, Limited Color Doppler and Cardiac Doppler Indications:    CHF-Acute Systolic C78.93  History:        Patient has prior history of Echocardiogram examinations, most                 recent 03/27/2022. Risk Factors:Hypertension, Diabetes,                 Dyslipidemia and Tobacco abuse (From Hx). NSTEMI this admit.  Sonographer:    Alvino Chapel RCS Referring Phys: [place]  1. Limted for heart failure  2. No apical thrombus with Definity contrast. Left ventricular ejection fraction, by estimation, is 40 to 45%. Left ventricular ejection fraction by 2D MOD biplane is 42.6 %. The left ventricle has mildly decreased function. The left ventricle demonstrates regional wall motion abnormalities (see scoring diagram/findings for description). There is severe hypokinesis of the left ventricular, mid-apical anteroseptal wall, anterior wall and apical segment. This is consistent with LAD territory ischemia/infarct.  3. The mitral valve is abnormal. Mild mitral valve regurgitation. Comparison(s): Changes from prior study are noted. 03/27/2022: LVEF 55-60%, normal wall motion. FINDINGS  Left Ventricle: No apical thrombus with Definity contrast. Left ventricular ejection fraction, by estimation, is 40 to 45%. Left ventricular ejection fraction by 2D MOD biplane is 42.6 %. The left ventricle has mildly decreased function. The left ventricle demonstrates regional wall motion abnormalities. Severe hypokinesis of the left ventricular, mid-apical anteroseptal wall, anterior wall and apical segment. Definity contrast agent was given IV to delineate the left ventricular endocardial borders. The left ventricular  internal cavity size was  normal in size. There is no left ventricular hypertrophy. Mitral Valve: The mitral valve is abnormal. There is mild thickening of the anterior and posterior mitral valve leaflet(s). Mild mitral valve regurgitation. LEFT VENTRICLE PLAX 2D                        Biplane EF (MOD) LVIDd:         5.00 cm         LV Biplane EF:   Left LVIDs:         3.90 cm                          ventricular LV PW:         1.10 cm                          ejection LV IVS:        1.00 cm                          fraction by                                                 2D MOD                                                 biplane is LV Volumes (MOD)                                42.6 %. LV vol d, MOD    131.0 ml A2C: LV vol d, MOD    158.0 ml A4C: LV vol s, MOD    81.2 ml A2C: LV vol s, MOD    94.8 ml A4C: LV SV MOD A2C:   49.8 ml LV SV MOD A4C:   158.0 ml LV SV MOD BP:    64.3 ml RIGHT VENTRICLE TAPSE (M-mode): 2.7 cm LEFT ATRIUM         Index LA diam:    4.20 cm 2.08 cm/m   AORTA Ao Root diam: 3.40 cm MITRAL VALVE MV Area (PHT): 4.80 cm MV Decel Time: 158 msec MV E velocity: 97.80 cm/s MV A velocity: 31.20 cm/s MV E/A ratio:  3.13 Lyman Bishop MD Electronically signed by Lyman Bishop MD Signature Date/Time: 06/05/2022/4:22:09 PM    Final    CARDIAC CATHETERIZATION  Result Date: 06/05/2022 CONCLUSIONS: 99% proximal LAD treated with overlapping Synergy stents dilated to 3.5 mm in diameter (3.5 x 16 proximal and overlapped distally with a 3.0 x 12).  TIMI grade II flow increased to TIMI grade III flow postprocedure.  Very distal stent margin has persistent eccentric 30 to 40% narrowing. Left main widely patent Circumflex gives origin to 4 obtuse marginals.  The third obtuse marginal is large and free of obstruction. Right coronary is dominant and contains proximal 25% narrowing. Left ventricular angiography by hand-injection demonstrated severe anteroapical hypokinesis with EF in the 35% range. Ongoing  post procedural pain without acute EKG changes.  Does have anterior T wave  inversion as he did prior to the procedure.  Also states that he has had chronic back pain that causes chest discomfort. RECOMMENDATIONS: IV nitroglycerin until pain controlled. Aspirin and Brilinta for 12 months. Distinguish whether ongoing chest discomfort is thoracic spine versus ongoing ischemia.  Suspect thoracic pain from disc disease.   CT Angio Chest PE W and/or Wo Contrast  Result Date: 06/04/2022 CLINICAL DATA:  Pulmonary embolism suspected, high probability. Chest pain and shortness of breath. EXAM: CT ANGIOGRAPHY CHEST WITH CONTRAST TECHNIQUE: Multidetector CT imaging of the chest was performed using the standard protocol during bolus administration of intravenous contrast. Multiplanar CT image reconstructions and MIPs were obtained to evaluate the vascular anatomy. RADIATION DOSE REDUCTION: This exam was performed according to the departmental dose-optimization program which includes automated exposure control, adjustment of the mA and/or kV according to patient size and/or use of iterative reconstruction technique. CONTRAST:  128m OMNIPAQUE IOHEXOL 350 MG/ML SOLN COMPARISON:  11/28/2020. FINDINGS: Cardiovascular: Heart is borderline enlarged and there is no pericardial effusion. Multi-vessel coronary artery calcifications are noted. There is atherosclerotic calcification of the aorta without evidence of aneurysm. The pulmonary trunk is normal in caliber. No pulmonary artery filling defect is identified. Mediastinum/Nodes: Enlarged lymph nodes are present in the mediastinum and hilar regions bilaterally. No axillary lymphadenopathy. The thyroid gland, trachea, and esophagus are within normal limits. Lungs/Pleura: There are moderate bilateral pleural effusions, greater on the right than on the left. Hazy ground-glass attenuation is noted in the lungs bilaterally. Patchy atelectasis or infiltrate is noted in the lower lobes.  No pneumothorax. Upper Abdomen: No acute abnormality. Musculoskeletal: Bilateral gynecomastia is noted. Degenerative changes are present in the thoracic spine. No acute osseous abnormality. Review of the MIP images confirms the above findings. IMPRESSION: 1. No evidence of pulmonary embolism. 2. Hazy attenuation in the lungs bilaterally, possible edema or infiltrate. 3. Moderate bilateral pleural effusions, greater on the right than on the left with patchy atelectasis or infiltrate at the lung bases. 4. Mediastinal and axillary lymphadenopathy, may be reactive. 5. Coronary artery calcifications. 6. Aortic atherosclerosis. Electronically Signed   By: LBrett FairyM.D.   On: 06/04/2022 21:48   DG Foot 2 Views Left  Result Date: 06/04/2022 CLINICAL DATA:  Foot ulcer.  Chest pain. EXAM: LEFT FOOT - 2 VIEW COMPARISON:  Left foot x-ray 03/25/2022 FINDINGS: There has been interval amputation of the first through fifth metatarsals and phalanges. There is soft tissue swelling of the foot. There is no acute fracture or dislocation. No definite cortical erosions are identified. There are degenerative changes of the dorsal midfoot and talonavicular joint. The bones are osteopenic. IMPRESSION: 1. 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 2. Diffuse soft tissue swelling of the foot. 3. No acute bony abnormality identified. If there is high clinical concern for osteomyelitis, an MRI is more sensitive. Electronically Signed   By: ARonney AstersM.D.   On: 06/04/2022 20:15   VAS [place]OWER EXTREMITY VENOUS (DVT) (7a-7p)  Result Date: 06/04/2022  Lower Venous DVT Study Patient Name:  MELIYA GEIMAN Date of Exam:   06/04/2022 Medical Rec #: [id number]        Accession #:    [id number] of Birth: [birth year]         Patient Gender: M Patient Age:   89years [facility]      [facility] [place]OWER EXTREMITY VENOUS (DVT) Referring Phys: ACampbell Stall --------------------------------------------------------------------------------  Indications:  Chest pain and LLE swelling.  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 Comparison Study: No previous exams Performing Technologist: Jody Hill RVT, RDMS  Examination Guidelines: A complete evaluation includes B-mode imaging, spectral Doppler, color Doppler, and power Doppler as needed of all accessible portions of each vessel. Bilateral testing is considered an integral part of a complete examination. Limited examinations for reoccurring indications may be performed as noted. The reflux portion of the exam is performed with the patient in reverse Trendelenburg.  +-----+---------------+---------+-----------+----------+--------------+ RIGHTCompressibilityPhasicitySpontaneityPropertiesThrombus Aging +-----+---------------+---------+-----------+----------+--------------+ CFV  Full           No       Yes                                 +-----+---------------+---------+-----------+----------+--------------+ Pulsatile doppler waveforms  +---------+---------------+---------+-----------+----------+-------------------+ LEFT     CompressibilityPhasicitySpontaneityPropertiesThrombus Aging      +---------+---------------+---------+-----------+----------+-------------------+ CFV      Full           No       Yes                                      +---------+---------------+---------+-----------+----------+-------------------+ SFJ      Full                                                             +---------+---------------+---------+-----------+----------+-------------------+ FV Prox  Full           No       Yes                                      +---------+---------------+---------+-----------+----------+-------------------+ FV Mid   Full           No       Yes                                       +---------+---------------+---------+-----------+----------+-------------------+ FV DistalFull           No       Yes                                      +---------+---------------+---------+-----------+----------+-------------------+ PFV      Full                                                             +---------+---------------+---------+-----------+----------+-------------------+ POP      Full           No       Yes                                      +---------+---------------+---------+-----------+----------+-------------------+  PTV      Full                                                             +---------+---------------+---------+-----------+----------+-------------------+ PERO                                                  Not well visualized +---------+---------------+---------+-----------+----------+-------------------+    Summary: RIGHT: - No evidence of common femoral vein obstruction.  LEFT: - There is no evidence of deep vein thrombosis in the lower extremity.  - No cystic structure found in the popliteal fossa. - Ultrasound characteristics of enlarged lymph nodes noted in the groin. Subcutaneous edema of calf and ankle.  *See table(s) above for measurements and observations.    Preliminary    DG Chest Portable 1 View  Result Date: 06/04/2022 CLINICAL DATA:  Chest pain EXAM: PORTABLE CHEST 1 VIEW COMPARISON:  03/25/2022 FINDINGS: Mild cardiomegaly. Mild, diffuse bilateral interstitial pulmonary opacity. The visualized skeletal structures are unremarkable. IMPRESSION: Mild cardiomegaly with mild, diffuse bilateral interstitial pulmonary opacity, likely edema. No focal airspace opacity. Electronically Signed   By: Delanna Ahmadi M.D.   On: 06/04/2022 15:11    Cardiac Studies   Echo from 06/05/22:   1. Limted for heart failure   2. No apical thrombus with Definity contrast. Left ventricular ejection  fraction, by estimation, is 40 to 45%. Left  ventricular ejection fraction  by 2D MOD biplane is 42.6 %. The left ventricle has mildly decreased  function. The left ventricle  demonstrates regional wall motion abnormalities (see scoring  diagram/findings for description). There is severe hypokinesis of the left  ventricular, mid-apical anteroseptal wall, anterior wall and apical  segment. This is consistent with LAD territory  ischemia/infarct.   3. The mitral valve is abnormal. Mild mitral valve regurgitation.   Comparison(s): Changes from prior study are noted. 03/27/2022: LVEF 55-60%,  normal wall motion.   LHC from 06/05/22:  Intervention    CONCLUSIONS:  99% proximal LAD treated with overlapping Synergy stents dilated to 3.5 mm in diameter (3.5 x 16 proximal and overlapped distally with a 3.0 x 12).  TIMI grade II flow increased to TIMI grade III flow postprocedure.  Very distal stent margin has persistent eccentric 30 to 40% narrowing. Left main widely patent Circumflex gives origin to 4 obtuse marginals.  The third obtuse marginal is large and free of obstruction. Right coronary is dominant and contains proximal 25% narrowing. Left ventricular angiography by hand-injection demonstrated severe anteroapical hypokinesis with EF in the 35% range. Ongoing post procedural pain without acute EKG changes.  Does have anterior T wave inversion as he did prior to the procedure.  Also states that he has had chronic back pain that causes chest discomfort. RECOMMENDATIONS: IV nitroglycerin until pain controlled. Aspirin and Brilinta for 12 months. Distinguish whether ongoing chest discomfort is thoracic spine versus ongoing ischemia.  Suspect thoracic pain from disc disease.  Patient Profile     61 y.o. 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   Assessment & Plan    NSTEMI CAD Ischemic cardiomyopathy  - Hs trop 809 >817 >654 - EKG showed old anterior TWI - LHC 06/05/22 showed 99% proximal LAD s/p stent - Echo 06/05/22 showed LVEF 40-45%, Severe hypokinesis of the left ventricular, mid-apical anteroseptal wall, anterior wall and apical segment; Mild MR - A1C 14.2%, needs tight DM control, consider adding SGLT2I once renal index improves - LDL 126, on lipitor historically, will add Zetia '10mg'$  daily - Recommend GDMT with DAPT with ASA '81mg'$  + Plavix '75mg'$  daily (need Elqiuis now for triple therapy therefore stopping Brilinta); stop home med atenolol, transition metoprolol '25mg'$  BID to XL '75mg'$  daily; continue lipitor '80mg'$  daily, add Zetia '10mg'$ ; add ACEI if renal index improving; will stop nitro gtt today as chest pain has resolved  - start PPI for 1 month while on triple therapy   Paroxysmal A fib with RVR, new since 03/2022  - telemetry showed A fib with RVR up to 120s with spontaneous conversion to SR over the past 24 hours , will repeat EKG now  - he has known A fib on EKG 03/2022 - CHA2DS2VASc score is 4 due to HTN, CHF, DM, CAD; recommend initiate anticoagulation with Eliquis '5mg'$  BID, will need triple therapy for at least 1 month before transtion to Eliquis + Plavix   - increased Metoprolol XL to '75mg'$  for rate control   SOB - maybe multifactorial from [place], [place], and anxiety  - no historical symptoms for CHF - symptoms has resolved now after Lasix use, monitor symptoms  HLD - LDL 126, on lipitor 80, added Zetia today  DM - A1C 14.2%, on insulin regimen per IM, would benefit adding Farixga if renal function stabilize     For questions or updates, please contact [facility] Please consult [email or web address] for contact info under        Signed, Margie Billet, NP  06/06/2022, 10:55 AM    Patient seen, examined. Available data reviewed. Agree with findings,  assessment, and plan as outlined by Margie Billet, NP.  The patient is independently interviewed and examined.  He is alert, oriented, in no distress.  Lungs are clear bilaterally, heart is regular rate and rhythm no murmur gallop, abdomen is soft and nontender, extremities with left metatarsal amputation and mild left leg edema.  The right leg has no edema.  All of this is stable.  Cardiac catheterization films reviewed and the patient underwent successful LAD PCI yesterday.  His telemetry is reviewed and shows paroxysms of atrial fibrillation multiple times overnight.  He will require oral anticoagulation for stroke risk reduction.  Therefore, we need to adjust his antiplatelet regimen.  Recommend the following: Aspirin 81 mg daily x30 days then stop Discontinue Brilinta Clopidogrel 300 mg loading dose this evening followed by 75 mg daily Apixaban 5 mg twice daily Empiric PPI due to bleeding risk of triple therapy, start pantoprazole 40 mg daily Increase metoprolol succinate to 75 mg daily with hopes of reducing A-fib burden (patient appears asymptomatic with this)  Orders written to reflect the above recommendations.  Discussed with patient.  Repeat metabolic panel in the morning since his creatinine has trended up from 1.24-1.46 as he is at risk of contrast nephropathy in the setting of his uncontrolled diabetes.  Will hold on additional diuresis with concerns about his risk of contrast nephropathy.  His shortness of breath may be multifactorial and can be related  to Brilinta.  He seemed to have significant improvement after single low dose of IV furosemide.  Otherwise medication recommendations as outlined above.  Our team will follow up in the morning.  Sherren Mocha, M.D. 06/06/2022 11:46 AM

## 2022-06-06 NOTE — Telephone Encounter (Signed)
Patient Advocate Encounter   Received notification that prior authorization for fentaNYL 75MCG/HR 72 hr patches is required.   PA submitted on 06/06/2022 Key [id number] Status is pending       Lyndel Safe, [place] Patient Advocate Specialist [place] Patient Advocate Team Direct Number: [phone]  Fax: [phone]

## 2022-06-06 NOTE — Progress Notes (Signed)
Patient Right Forearm Assessment  Patient Right Forearm Level 3- tight and taught, mild hematoma observed above the TR Band, Palm/fingers coloration -purple/dusky, cap refill greater than 5 sec   Manual BP cuff inflated for 10 min on the Right Forearm- TR Band repositioned proximal to Access site and inflated with 10 cc of air. Patient VS remain unchanged, SATs  98-100 RA- Reversed Allen test confirmed and positive,  Removed 1 cc of air at 1245 Manual pressure held on Hematoma above the TR band for 10 min-- Level 0 obtained  Removed 2 cc of air 1300 Hemostasis confirmed and obtain- Level 0  Dr. Tamala Julian at bedside to assess and confirmed site is stable. Right Forearm soft, palm/fingers color is normal, cap refill within 5 seconds, [employer] updated as well.

## 2022-06-06 NOTE — Discharge Instructions (Signed)

## 2022-06-07 DIAGNOSIS — E11621 Type 2 diabetes mellitus with foot ulcer: Secondary | ICD-10-CM

## 2022-06-07 DIAGNOSIS — L97529 Non-pressure chronic ulcer of other part of left foot with unspecified severity: Secondary | ICD-10-CM

## 2022-06-07 DIAGNOSIS — I214 Non-ST elevation (NSTEMI) myocardial infarction: Secondary | ICD-10-CM | POA: Diagnosis not present

## 2022-06-07 LAB — CBC WITH DIFFERENTIAL/PLATELET
Abs Immature Granulocytes: 0.01 10*3/uL (ref 0.00–0.07)
Basophils Absolute: 0 10*3/uL (ref 0.0–0.1)
Basophils Relative: 1 %
Eosinophils Absolute: 0.1 10*3/uL (ref 0.0–0.5)
Eosinophils Relative: 2 %
HCT: 29.6 % — ABNORMAL LOW (ref 39.0–52.0)
Hemoglobin: 9.7 g/dL — ABNORMAL LOW (ref 13.0–17.0)
Immature Granulocytes: 0 %
Lymphocytes Relative: 21 %
Lymphs Abs: 0.7 10*3/uL (ref 0.7–4.0)
MCH: 28 pg (ref 26.0–34.0)
MCHC: 32.8 g/dL (ref 30.0–36.0)
MCV: 85.3 fL (ref 80.0–100.0)
Monocytes Absolute: 0.3 10*3/uL (ref 0.1–1.0)
Monocytes Relative: 9 %
Neutro Abs: 2.3 10*3/uL (ref 1.7–7.7)
Neutrophils Relative %: 67 %
Platelets: 117 10*3/uL — ABNORMAL LOW (ref 150–400)
RBC: 3.47 MIL/uL — ABNORMAL LOW (ref 4.22–5.81)
RDW: 14.2 % (ref 11.5–15.5)
WBC: 3.5 10*3/uL — ABNORMAL LOW (ref 4.0–10.5)
nRBC: 0 % (ref 0.0–0.2)

## 2022-06-07 LAB — BASIC METABOLIC PANEL
Anion gap: 8 (ref 5–15)
BUN: 22 mg/dL (ref 8–23)
CO2: 24 mmol/L (ref 22–32)
Calcium: 8.8 mg/dL — ABNORMAL LOW (ref 8.9–10.3)
Chloride: 104 mmol/L (ref 98–111)
Creatinine, Ser: 1.44 mg/dL — ABNORMAL HIGH (ref 0.61–1.24)
GFR, Estimated: 55 mL/min — ABNORMAL LOW (ref >60)
Glucose, Bld: 176 mg/dL — ABNORMAL HIGH (ref 70–99)
Potassium: 4.2 mmol/L (ref 3.5–5.1)
Sodium: 136 mmol/L (ref 135–145)

## 2022-06-07 LAB — GLUCOSE, CAPILLARY
Glucose-Capillary: 143 mg/dL — ABNORMAL HIGH (ref 70–99)
Glucose-Capillary: 113 mg/dL — ABNORMAL HIGH (ref 70–99)
Glucose-Capillary: 162 mg/dL — ABNORMAL HIGH (ref 70–99)

## 2022-06-07 LAB — T4, FREE: Free T4: 0.7 ng/dL (ref 0.61–1.12)

## 2022-06-07 LAB — TSH: TSH: 1.744 u[IU]/mL (ref 0.350–4.500)

## 2022-06-07 MED ORDER — POLYETHYLENE GLYCOL 3350 17 G PO PACK
17.0000 g | PACK | Freq: Two times a day (BID) | ORAL | Status: DC
  Administered 2022-06-07 – 2022-06-08 (×2): 17 g via ORAL
  Filled 2022-06-07 (×3): qty 1

## 2022-06-07 MED ORDER — METOPROLOL SUCCINATE ER 100 MG PO TB24: 100.0000 mg | ORAL_TABLET | Freq: Every day | ORAL | Status: DC

## 2022-06-07 MED ORDER — MEDIHONEY WOUND/BURN DRESSING EX PSTE
1.0000 | PASTE | Freq: Every day | CUTANEOUS | Status: DC
  Administered 2022-06-07 – 2022-06-08 (×2): 1 via TOPICAL
  Filled 2022-06-07: qty 44

## 2022-06-07 MED ORDER — EMPAGLIFLOZIN 10 MG PO TABS
10.0000 mg | ORAL_TABLET | Freq: Every day | ORAL | Status: DC
Start: 2022-06-07 — End: 2022-06-08
  Administered 2022-06-07 – 2022-06-08 (×2): 10 mg via ORAL
  Filled 2022-06-07 (×2): qty 1

## 2022-06-07 MED ORDER — METOPROLOL SUCCINATE ER 50 MG PO TB24
75.0000 mg | ORAL_TABLET | Freq: Every day | ORAL | Status: DC
  Administered 2022-06-07 – 2022-06-08 (×2): 75 mg via ORAL
  Filled 2022-06-07 (×2): qty 1

## 2022-06-07 MED ORDER — LORATADINE 10 MG PO TABS
10.0000 mg | ORAL_TABLET | Freq: Every day | ORAL | Status: DC
  Administered 2022-06-07 – 2022-06-08 (×2): 10 mg via ORAL
  Filled 2022-06-07 (×2): qty 1

## 2022-06-07 MED ORDER — FUROSEMIDE 10 MG/ML IJ SOLN
20.0000 mg | Freq: Once | INTRAMUSCULAR | Status: AC
  Administered 2022-06-07: 20 mg via INTRAVENOUS
  Filled 2022-06-07: qty 2

## 2022-06-07 NOTE — Progress Notes (Signed)
Orthopedic Tech Progress Note Patient Details:  Robert Burnett 1960/12/12 [id number]  CAM boot applied to LLE. Fits quite snug at this moment due to the bulky dressing, but overall fits appropriately and with no complaints from the pt. Pt understands he is still NWB to the LLE.  Ortho Devices Type of Ortho Device: CAM walker Ortho Device/Splint Location: LLE Ortho Device/Splint Interventions: Ordered, Application, Adjustment   Post Interventions Patient Tolerated: Well Instructions Provided: Care of device, Adjustment of device  Rhian Asebedo [name] 06/07/2022, 12:08 PM

## 2022-06-07 NOTE — Progress Notes (Addendum)
Progress Note  Patient Name: Robert Burnett Date of Encounter: 06/07/2022  [facility] Cardiologist: Sherren Mocha, MD   Subjective   Some SOB this AM  Inpatient Medications    Scheduled Meds:  apixaban  5 mg Oral BID   aspirin  81 mg Oral Daily   atorvastatin  80 mg Oral Daily   cephALEXin  500 mg Oral Q8H   clopidogrel  75 mg Oral Daily   ezetimibe  10 mg Oral Daily   fentaNYL  1 patch Transdermal Q72H   finasteride  5 mg Oral Daily   insulin aspart  0-15 Units Subcutaneous TID AC & HS   insulin aspart protamine- aspart  10 Units Subcutaneous BID WC   lidocaine  1 patch Transdermal Q24H   loratadine  10 mg Oral Daily   metoprolol succinate  75 mg Oral Daily   pantoprazole  40 mg Oral Daily   sodium chloride flush  3 mL Intravenous Q12H   sodium chloride flush  3 mL Intravenous Q12H   Continuous Infusions:  sodium chloride     PRN Meds: sodium chloride, acetaminophen, acetaminophen, melatonin, morphine injection, ondansetron (ZOFRAN) IV, ondansetron (ZOFRAN) IV, oxyCODONE, pantoprazole, sodium chloride flush   Vital Signs    Vitals:   06/06/22 1204 06/06/22 1237 06/06/22 2100 06/07/22 0522  BP: 137/85 138/87 131/77 (!) 146/93  Pulse: 78 71    Resp:  '17 20 18  '$ Temp:  98.6 F (37 C) 98.2 F (36.8 C) 98.2 F (36.8 C)  TempSrc:  Oral Oral Oral  SpO2:      Weight:    81 kg  Height:        Intake/Output Summary (Last 24 hours) at 06/07/2022 0722 Last data filed at 06/07/2022 0436 Gross per 24 hour  Intake 1238 ml  Output 1000 ml  Net 238 ml      06/07/2022    5:22 AM 06/06/2022    4:44 AM 06/05/2022   12:00 AM  Last 3 Weights  Weight (lbs) 178 lb 9.2 oz 182 lb 15.7 oz 176 lb 5.9 oz  Weight (kg) 81 kg 83 kg 80 kg      Telemetry    SR, run of afib - Personally Reviewed  ECG    N/a - Personally Reviewed  Physical Exam   GEN: No acute distress.   Neck: No JVD Cardiac: RRR, no murmurs, rubs, or gallops.  Respiratory: Clear to auscultation  bilaterally. GI: Soft, nontender, non-distended  MS: No edema; No deformity. Neuro:  Nonfocal  Psych: Normal affect   Labs    High Sensitivity Troponin:   Recent Labs  Lab 06/04/22 1517 06/04/22 1713 06/04/22 1909 06/04/22 2127  TROPONINIHS 809* 817* 654* 549*     Chemistry Recent Labs  Lab 06/04/22 1517 06/05/22 0355 06/06/22 0338 06/07/22 0223  NA 136 131* 133* 136  K 4.0 4.3 3.8 4.2  CL 103 100 97* 104  CO2 '23 22 22 24  '$ GLUCOSE 119* 157* 168* 176*  BUN '17 18 17 22  '$ CREATININE 1.17 1.24 1.46* 1.44*  CALCIUM 9.2 8.7* 8.9 8.8*  MG 1.6* 1.9  --   --   GFRNONAA >60 >60 54* 55*  ANIONGAP '10 9 14 8    '$ Lipids  Recent Labs  Lab 06/05/22 0355  CHOL 188  TRIG 118  HDL 38*  LDLCALC 126*  CHOLHDL 4.9    Hematology Recent Labs  Lab 06/05/22 0355 06/06/22 0338 06/07/22 0223  WBC 5.0 5.4 3.5*  RBC 3.51* 3.41* 3.47*  HGB 9.9* 9.7* 9.7*  HCT 30.1* 29.3* 29.6*  MCV 85.8 85.9 85.3  MCH 28.2 28.4 28.0  MCHC 32.9 33.1 32.8  RDW 14.1 14.2 14.2  PLT 128* 140* 117*   Thyroid  Recent Labs  Lab 06/07/22 0223  TSH 1.744  FREET4 0.70    BNPNo results for input(s): "BNP", "PROBNP" in the last 168 hours.  DDimer  Recent Labs  Lab 06/04/22 1713  DDIMER 0.88*     Radiology    ECHOCARDIOGRAM LIMITED  Result Date: 06/05/2022    ECHOCARDIOGRAM LIMITED REPORT   Patient Name:   Robert Burnett Date of Exam: 06/05/2022 Medical Rec #:  [id number]        Height:       72.0 in Accession #:    [id number]       Weight:       176.4 lb Date of Birth:  1961/08/07         BSA:          2.020 m Patient Age:    61 years         BP:           122/77 mmHg Patient Gender: M                HR:           79 bpm. Exam Location:  Inpatient Procedure: Limited Echo, Limited Color Doppler and Cardiac Doppler Indications:    CHF-Acute Systolic P95.09  History:        Patient has prior history of Echocardiogram examinations, most                 recent 03/27/2022. Risk Factors:Hypertension,  Diabetes,                 Dyslipidemia and Tobacco abuse (From Hx). NSTEMI this admit.  Sonographer:    Alvino Chapel RCS Referring Phys: [place]  1. Limted for heart failure  2. No apical thrombus with Definity contrast. Left ventricular ejection fraction, by estimation, is 40 to 45%. Left ventricular ejection fraction by 2D MOD biplane is 42.6 %. The left ventricle has mildly decreased function. The left ventricle demonstrates regional wall motion abnormalities (see scoring diagram/findings for description). There is severe hypokinesis of the left ventricular, mid-apical anteroseptal wall, anterior wall and apical segment. This is consistent with LAD territory ischemia/infarct.  3. The mitral valve is abnormal. Mild mitral valve regurgitation. Comparison(s): Changes from prior study are noted. 03/27/2022: LVEF 55-60%, normal wall motion. FINDINGS  Left Ventricle: No apical thrombus with Definity contrast. Left ventricular ejection fraction, by estimation, is 40 to 45%. Left ventricular ejection fraction by 2D MOD biplane is 42.6 %. The left ventricle has mildly decreased function. The left ventricle demonstrates regional wall motion abnormalities. Severe hypokinesis of the left ventricular, mid-apical anteroseptal wall, anterior wall and apical segment. Definity contrast agent was given IV to delineate the left ventricular endocardial borders. The left ventricular internal cavity size was normal in size. There is no left ventricular hypertrophy. Mitral Valve: The mitral valve is abnormal. There is mild thickening of the anterior and posterior mitral valve leaflet(s). Mild mitral valve regurgitation. LEFT VENTRICLE PLAX 2D                        Biplane EF (MOD) LVIDd:         5.00 cm  LV Biplane EF:   Left LVIDs:         3.90 cm                          ventricular LV PW:         1.10 cm                          ejection LV IVS:        1.00 cm                          fraction by                                                  2D MOD                                                 biplane is LV Volumes (MOD)                                42.6 %. LV vol d, MOD    131.0 ml A2C: LV vol d, MOD    158.0 ml A4C: LV vol s, MOD    81.2 ml A2C: LV vol s, MOD    94.8 ml A4C: LV SV MOD A2C:   49.8 ml LV SV MOD A4C:   158.0 ml LV SV MOD BP:    64.3 ml RIGHT VENTRICLE TAPSE (M-mode): 2.7 cm LEFT ATRIUM         Index LA diam:    4.20 cm 2.08 cm/m   AORTA Ao Root diam: 3.40 cm MITRAL VALVE MV Area (PHT): 4.80 cm MV Decel Time: 158 msec MV E velocity: 97.80 cm/s MV A velocity: 31.20 cm/s MV E/A ratio:  3.13 Lyman Bishop MD Electronically signed by Lyman Bishop MD Signature Date/Time: 06/05/2022/4:22:09 PM    Final    CARDIAC CATHETERIZATION  Result Date: 06/05/2022 CONCLUSIONS: 99% proximal LAD treated with overlapping Synergy stents dilated to 3.5 mm in diameter (3.5 x 16 proximal and overlapped distally with a 3.0 x 12).  TIMI grade II flow increased to TIMI grade III flow postprocedure.  Very distal stent margin has persistent eccentric 30 to 40% narrowing. Left main widely patent Circumflex gives origin to 4 obtuse marginals.  The third obtuse marginal is large and free of obstruction. Right coronary is dominant and contains proximal 25% narrowing. Left ventricular angiography by hand-injection demonstrated severe anteroapical hypokinesis with EF in the 35% range. Ongoing post procedural pain without acute EKG changes.  Does have anterior T wave inversion as he did prior to the procedure.  Also states that he has had chronic back pain that causes chest discomfort. RECOMMENDATIONS: IV nitroglycerin until pain controlled. Aspirin and Brilinta for 12 months. Distinguish whether ongoing chest discomfort is thoracic spine versus ongoing ischemia.  Suspect thoracic pain from disc disease.    Cardiac Studies     Patient Profile     61 y.o. 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   Assessment & Plan    1.CAD/NSTEMI/ICM - Hs trop 809 >817 >654 - EKG showed old anterior TWI - LHC 06/05/22 showed 99% proximal LAD s/p stent - Echo 06/05/22 showed LVEF 40-45%, Severe hypokinesis of the left ventricular, mid-apical anteroseptal wall, anterior wall and apical segment; Mild MR  -medical therapy with ASA 81, atorva 80, plavix 75, zetia 10, toprol 75. With AKI not on ACE/ARB/ARNI/aldactone at this time. Will add jardiance '10mg'$  daily.  - on eliquis for afib as well, plan 1 month triple therapy ASA/plavix/eliquis then stop aspirin  2.Afib - continue toprol, eliquis   3. SOB - improved with diuresis, also changing from brillinta to plavix.  - mild recurrent episode this AM. Could still be related to brillinta, will give low dose lasix '20mg'$  x 1 since had improvement yesterday, monitor renal function   Monitor SOB into late morning/early afternoon, if doing well would be ok for d/c today We will arrange f/u    For questions or updates, please contact [place] Please consult [email or web address] for contact info under        Signed, Carlyle Dolly, MD  06/07/2022, 7:22 AM

## 2022-06-07 NOTE — Progress Notes (Signed)
   PODIATRY PROGRESS NOTE  NAME Robert Burnett MRN [id number] DOB 03-30-1961 DOA 06/04/2022   Reason for consult:  Chief Complaint  Patient presents with   Chest Pain     History of present illness: 61 y.o. male admitted for NSTEMI underwent stenting of LAD on 06/05/2022.  Patient reports he is feeling better today.  Podiatry consulted for ulcer which is new since last appointment with Robert Burnett.   Vitals:   06/07/22 0522 06/07/22 0731  BP: (!) 146/93 (!) 138/90  Pulse:  65  Resp: 18 20  Temp: 98.2 F (36.8 C) 98.2 F (36.8 C)  SpO2:  97%       Latest Ref Rng & Units 06/07/2022    2:23 AM 06/06/2022    3:38 AM 06/05/2022    3:55 AM  CBC  WBC 4.0 - 10.5 K/uL 3.5  5.4  5.0   Hemoglobin 13.0 - 17.0 g/dL 9.7  9.7  9.9   Hematocrit 39.0 - 52.0 % 29.6  29.3  30.1   Platelets 150 - 400 K/uL 117  140  128        Latest Ref Rng & Units 06/07/2022    2:23 AM 06/06/2022    3:38 AM 06/05/2022    3:55 AM  BMP  Glucose 70 - 99 mg/dL 176  168  157   BUN 8 - 23 mg/dL '22  17  18   '$ Creatinine 0.61 - 1.24 mg/dL 1.44  1.46  1.24   Sodium 135 - 145 mmol/L 136  133  131   Potassium 3.5 - 5.1 mmol/L 4.2  3.8  4.3   Chloride 98 - 111 mmol/L 104  97  100   CO2 22 - 32 mmol/L '24  22  22   '$ Calcium 8.9 - 10.3 mg/dL 8.8  8.9  8.7       Physical Exam: General: NAD   Dermatology: Ulceration present left foot submetatarsal 1.  There is no probing to bone, and or tunneling.  No surrounding erythema with minimal edema.  There is no fluctuance or crepitation but there is no malodor.  No undermining or tunneling.  Vascular: Pulses palpable  Neurological: Sensation decreased  Musculoskeletal Exam: Status post Lisfranc disarticulation    ASSESSMENT/PLAN OF CARE New ulceration left foot  -Continue Keflex for 7 days -NWB, will order CAM boot.  Discussed the importance of this to help facilitate wound healing.  Discussed long-term new orthotic for offloading. -Daily dressing changes.   Dressing changed today. -Glucose control -From podiatry standpoint able to discharge with close follow-up.     Please contact me directly with any questions or concerns.     Celesta Gentile, DPM [facility]  [name], [name], [name] [address]                Office [phone]  Fax [phone]

## 2022-06-07 NOTE — Progress Notes (Signed)
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  He is a status post LAD stent placement 06/06/2022.  He has received IV Lasix as well for heart failure exacerbation    Assessment & Plan:   Principal Problem:   NSTEMI (non-ST elevated myocardial infarction) ([place]) Active Problems:   Hypomagnesemia   Diabetic ulcer of left foot (HCC)   Type 2 diabetes mellitus with complication, with long-term current use of insulin (HCC)   Hypertension   Hyperlipidemia   Chest pain  1-Non-STEMI ischemic cardiomyopathy ejection fraction 40 to 45% Presents  with chest pain and elevation of troponin. (549) Status post stent to LAD on 8/17 Continue with aspirin, Brilinta was changed to Plavix On Eliquis On metoprolol  PAF new diagnosis On metoprolol and Eliquis  Acute Systolic heart failure exacerbation By CT moderate bilateral pleural effusion, hazy attenuation lungs bilaterally, possible edema or infiltrates. He complaining of shortness of breath on admission. He received IV lasix on 8/17. He develops SOB this morning.  20 Mg IV lasix ordered by cardiology Plan to monitor overnight.    AKI on CKD stage II: Creatinine increased from 1.2-1.4 Remained stable today Repeat labs in am.  UA trace protein, follow-up outpatient   Hyponatremia: Likely from volume overload.  Monitor chalasis Resolved.  Hypomagnesemia: Replaced  Diabetes type 2 with complication with long-term current use of insulin: Uncontrolled A1c 14  03/2022 On long-acting insulin, sliding scale.  Resume 7030 at discharge  Diabetic ulcer of the left foot: Status post transmetatarsal amputation of the left foot 03/2022 ABI normal 03/2022 1 (obese foot since a week ago x-ray diffuse soft tissue swelling Doppler negative for DVT Dr Earleen Newport consulted recommended Keflex for 7 days Unna boot  Mild Thrombocytopenia; monitor.   Estimated body mass index is 24.22 kg/m as calculated from the following:   Height as of this encounter: 6' (1.829 m).   Weight as of this encounter: 81 kg.   DVT prophylaxis: Eliquis Code Status: Full code Family Communication: care discussed with patient Disposition Plan:  Status is: Inpatient Remains inpatient appropriate because: management HF    Consultants:  Cardiology   Procedures:  Cath  Antimicrobials:    Subjective: He was having SOB this am, some improvement.  Chest pain free.   Objective: Vitals:   06/06/22 1237 06/06/22 2100 06/07/22 0522 06/07/22 0731  BP: 138/87 131/77 (!) 146/93 (!) 138/90  Pulse: 71   65  Resp: '17 20 18 20  '$ Temp: 98.6 F (37 C) 98.2 F (36.8 C) 98.2 F (36.8 C) 98.2 F (36.8 C)  TempSrc: Oral Oral Oral Oral  SpO2:    97%  Weight:   81 kg   Height:        Intake/Output Summary (Last 24 hours) at 06/07/2022 1704 Last data filed at 06/07/2022 1520 Gross per 24 hour  Intake 1238 ml  Output 2600 ml  Net -1362 ml   Filed Weights   06/05/22 0000 06/06/22 0444 06/07/22 0522  Weight: 80 kg 83 kg 81 kg    Examination:  General exam: Appears calm and comfortable  Respiratory system: Clear to auscultation. Respiratory effort normal. Cardiovascular system: S1 & S2 heard, IRR.  Gastrointestinal system: Abdomen is nondistended, soft and nontender. No organomegaly or masses felt. Normal bowel sounds heard. Central nervous system: Alert and oriented. No focal neurological deficits. Extremities: Symmetric 5 x 5 power.    Data Reviewed: I have personally  reviewed following labs and imaging studies  CBC: Recent Labs  Lab 06/04/22 1517 06/05/22 0355 06/06/22 0338 06/07/22 0223  WBC 5.9 5.0 5.4 3.5*  NEUTROABS 4.3  --   --  2.3  HGB 10.4* 9.9* 9.7* 9.7*  HCT 32.1* 30.1* 29.3* 29.6*  MCV 86.5 85.8 85.9 85.3  PLT 145* 128* 140* 846*   Basic Metabolic Panel: Recent Labs  Lab 06/04/22 1517 06/05/22 0355 06/06/22 0338 06/07/22 0223  NA 136 131* 133* 136  K 4.0 4.3 3.8 4.2  CL 103 100 97* 104  CO2 '23 22 22 24  '$ GLUCOSE 119* 157* 168* 176*  BUN '17 18 17 22  '$ CREATININE 1.17 1.24 1.46* 1.44*  CALCIUM 9.2 8.7* 8.9 8.8*  MG 1.6* 1.9  --   --    GFR: Estimated Creatinine Clearance: 58.4 mL/min (A) (by C-G formula based on SCr of 1.44 mg/dL (H)). Liver Function Tests: No results for input(s): "AST", "ALT", "ALKPHOS", "BILITOT", "PROT", "ALBUMIN" in the last 168 hours. No results for input(s): "LIPASE", "AMYLASE" in the last 168 hours. No results for input(s): "AMMONIA" in the last 168 hours. Coagulation Profile: No results for input(s): "INR", "PROTIME" in the last 168 hours. Cardiac Enzymes: No results for input(s): "CKTOTAL", "CKMB", "CKMBINDEX", "TROPONINI" in the last 168 hours. BNP (last 3 results) No results for input(s): "PROBNP" in the last 8760 hours. HbA1C: No results for input(s): "HGBA1C" in the last 72 hours. CBG: Recent Labs  Lab 06/06/22 1725 06/06/22 2209 06/07/22 0728 06/07/22 1151 06/07/22 1639  GLUCAP 172* 187* 143* 113* 162*   Lipid Profile: Recent Labs    06/05/22 0355  CHOL 188  HDL 38*  LDLCALC 126*  TRIG 118  CHOLHDL 4.9   Thyroid Function Tests: Recent Labs    06/07/22 0223  TSH 1.744  FREET4 0.70   Anemia Panel: No results for input(s): "VITAMINB12", "FOLATE", "FERRITIN", "TIBC", "IRON", "RETICCTPCT" in the last 72 hours. Sepsis Labs: No results for input(s): "PROCALCITON", "LATICACIDVEN" in the last 168 hours.  No results found for this or any previous visit (from the past 240  hour(s)).       Radiology Studies: No results found.      Scheduled Meds:  apixaban  5 mg Oral BID   aspirin  81 mg Oral Daily   atorvastatin  80 mg Oral Daily   cephALEXin  500 mg Oral Q8H   clopidogrel  75 mg Oral Daily   empagliflozin  10 mg Oral Daily   ezetimibe  10 mg Oral Daily   fentaNYL  1 patch Transdermal Q72H   finasteride  5 mg Oral Daily   insulin aspart  0-15 Units Subcutaneous TID AC & HS   insulin aspart protamine- aspart  10 Units Subcutaneous BID WC   leptospermum manuka honey  1 Application Topical Daily   lidocaine  1 patch Transdermal Q24H   loratadine  10 mg Oral Daily   metoprolol succinate  75 mg Oral Daily   pantoprazole  40 mg Oral Daily   polyethylene glycol  17 g Oral BID   sodium chloride flush  3 mL Intravenous Q12H   sodium chloride flush  3  mL Intravenous Q12H   Continuous Infusions:  sodium chloride       LOS: 3 days    Time spent: 35 minutes    Robert Burnett A Elena Davia, MD [facility]   If 7PM-7AM, please contact night-coverage [email or web address]  06/07/2022, 5:04 PM

## 2022-06-07 NOTE — Evaluation (Signed)
Physical Therapy Evaluation Patient Details Name: Robert Burnett MRN: [id number] DOB: 1961/06/25 Today's Date: 06/07/2022  History of Present Illness  Pt is a 61 y/o male admitted secondary to chest pain. Pt with NSTEMI. Pt also with L foot wound. PMH includes L transmet amputation, HTN, and DM.  Clinical Impression  Pt admitted secondary to problem above with deficits below. Pt requiring min guard A for mobility tasks within the room. Tended to put weight on heel of LLE and required continuous cues to maintain NWB. Educated about using WC at home to increase safety and ensure maintenance of weightbearing precautions. Feel pt would benefit from HHPT at d/c. Will continue to follow acutely.        Recommendations for follow up therapy are one component of a multi-disciplinary discharge planning process, led by the attending physician.  Recommendations may be updated based on patient status, additional functional criteria and insurance authorization.  Follow Up Recommendations Home health PT (pt prefers to go home)      Assistance Recommended at Discharge Intermittent Supervision/Assistance  Patient can return home with the following  A little help with walking and/or transfers;A little help with bathing/dressing/bathroom;Assistance with cooking/housework;Help with stairs or ramp for entrance;Assist for transportation    Equipment Recommendations None recommended by PT  Recommendations for Other Services       Functional Status Assessment Patient has had a recent decline in their functional status and demonstrates the ability to make significant improvements in function in a reasonable and predictable amount of time.     Precautions / Restrictions Precautions Precautions: Fall Required Braces or Orthoses: Other Brace Other Brace: CAM boot Restrictions Weight Bearing Restrictions: Yes LLE Weight Bearing: Non weight bearing      Mobility  Bed Mobility               General  bed mobility comments: sitting EOB upon entry    Transfers Overall transfer level: Needs assistance Equipment used: Rolling walker (2 wheels) Transfers: Sit to/from Stand Sit to Stand: Min guard           General transfer comment: Cues to maintain NWB as pt had preference to put weight on his heel    Ambulation/Gait Ambulation/Gait assistance: Min guard Gait Distance (Feet): 20 Feet Assistive device: Rolling walker (2 wheels) Gait Pattern/deviations: Step-to pattern Gait velocity: Decreased     General Gait Details: Increased shakiness. Cues to maintain NWB. Educated about using WC in order to ensure maintenance of WB precautions.  Stairs            Wheelchair Mobility    Modified Rankin (Stroke Patients Only)       Balance Overall balance assessment: Needs assistance Sitting-balance support: No upper extremity supported, Feet supported Sitting balance-[name]: Good     Standing balance support: Bilateral upper extremity supported Standing balance-[name]: Poor Standing balance comment: Reliant on BUE support                             Pertinent Vitals/Pain Pain Assessment Pain Assessment: Faces Faces Pain Scale: Hurts little more Pain Location: back Pain Descriptors / Indicators: Grimacing, Guarding Pain Intervention(s): Limited activity within patient's tolerance, Monitored during session, Repositioned    Home Living Family/patient expects to be discharged to:: Private residence Living Arrangements: Other relatives Available Help at Discharge: Family;Available PRN/intermittently Type of Home: House Home Access: Stairs to enter Entrance Stairs-Rails: None Entrance Stairs-Number of Steps: small step and 6  inch step   Home Layout: Two level;Able to live on main level with bedroom/bathroom Home Equipment: Rolling Walker (2 wheels);Wheelchair - manual;BSC/3in1;Crutches      Prior Function Prior Level of Function :  Independent/Modified Independent                     Hand Dominance   Dominant Hand: Right    Extremity/Trunk Assessment   Upper Extremity Assessment Upper Extremity Assessment: Defer to OT evaluation    Lower Extremity Assessment Lower Extremity Assessment: LLE deficits/detail LLE Deficits / Details: L foot in CAM boot    Cervical / Trunk Assessment Cervical / Trunk Assessment: Normal  Communication   Communication: No difficulties  Cognition Arousal/Alertness: Awake/alert Behavior During Therapy: WFL for tasks assessed/performed Overall Cognitive Status: No family/caregiver present to determine baseline cognitive functioning                                 General Comments: Decreased safety awareness noted        General Comments      Exercises     Assessment/Plan    PT Assessment Patient needs continued PT services  PT Problem List Decreased strength;Decreased activity tolerance;Decreased balance;Decreased mobility;Decreased knowledge of use of DME;Decreased knowledge of precautions;Decreased safety awareness       PT Treatment Interventions Stair training;Gait training;DME instruction;Therapeutic exercise;Therapeutic activities;Functional mobility training;Balance training;Patient/family education    PT Goals (Current goals can be found in the Care Plan section)  Acute Rehab PT Goals Patient Stated Goal: to go home PT Goal Formulation: With patient Time For Goal Achievement: 06/21/22 Potential to Achieve Goals: Good    Frequency Min 3X/week     Co-evaluation               AM-PAC PT "6 Clicks" Mobility  Outcome Measure Help needed turning from your back to your side while in a flat bed without using bedrails?: None Help needed moving from lying on your back to sitting on the side of a flat bed without using bedrails?: None Help needed moving to and from a bed to a chair (including a wheelchair)?: A Little Help needed  standing up from a chair using your arms (e.g., wheelchair or bedside chair)?: A Little Help needed to walk in hospital room?: A Little Help needed climbing 3-5 steps with a railing? : A Lot 6 Click Score: 19    End of Session Equipment Utilized During Treatment: Gait belt Activity Tolerance: Patient tolerated treatment well Patient left: in bed;with call bell/phone within reach Nurse Communication: Mobility status PT Visit Diagnosis: Unsteadiness on feet (R26.81);Muscle weakness (generalized) (M62.81)    Time: 2353-6144 PT Time Calculation (min) (ACUTE ONLY): 14 min   Charges:   PT Evaluation $PT Eval Low Complexity: 1 Low          Reuel Derby, PT, DPT  Acute Rehabilitation Services  Office: [phone]   Rudean Hitt 06/07/2022, 1:52 PM

## 2022-06-08 DIAGNOSIS — I214 Non-ST elevation (NSTEMI) myocardial infarction: Secondary | ICD-10-CM | POA: Diagnosis not present

## 2022-06-08 LAB — GLUCOSE, CAPILLARY
Glucose-Capillary: 125 mg/dL — ABNORMAL HIGH (ref 70–99)
Glucose-Capillary: 135 mg/dL — ABNORMAL HIGH (ref 70–99)
Glucose-Capillary: 181 mg/dL — ABNORMAL HIGH (ref 70–99)
Glucose-Capillary: 191 mg/dL — ABNORMAL HIGH (ref 70–99)

## 2022-06-08 LAB — BASIC METABOLIC PANEL
Anion gap: 10 (ref 5–15)
BUN: 32 mg/dL — ABNORMAL HIGH (ref 8–23)
CO2: 19 mmol/L — ABNORMAL LOW (ref 22–32)
Calcium: 8.8 mg/dL — ABNORMAL LOW (ref 8.9–10.3)
Chloride: 101 mmol/L (ref 98–111)
Creatinine, Ser: 1.58 mg/dL — ABNORMAL HIGH (ref 0.61–1.24)
GFR, Estimated: 49 mL/min — ABNORMAL LOW (ref >60)
Glucose, Bld: 136 mg/dL — ABNORMAL HIGH (ref 70–99)
Potassium: 4 mmol/L (ref 3.5–5.1)
Sodium: 130 mmol/L — ABNORMAL LOW (ref 135–145)

## 2022-06-08 LAB — CBC
HCT: 31.4 % — ABNORMAL LOW (ref 39.0–52.0)
Hemoglobin: 10.3 g/dL — ABNORMAL LOW (ref 13.0–17.0)
MCH: 28.3 pg (ref 26.0–34.0)
MCHC: 32.8 g/dL (ref 30.0–36.0)
MCV: 86.3 fL (ref 80.0–100.0)
Platelets: 154 10*3/uL (ref 150–400)
RBC: 3.64 MIL/uL — ABNORMAL LOW (ref 4.22–5.81)
RDW: 14.2 % (ref 11.5–15.5)
WBC: 5.5 10*3/uL (ref 4.0–10.5)
nRBC: 0 % (ref 0.0–0.2)

## 2022-06-08 MED ORDER — POLYETHYLENE GLYCOL 3350 17 G PO PACK: 17.0000 g | PACK | Freq: Two times a day (BID) | ORAL | 0 refills | Status: DC

## 2022-06-08 MED ORDER — POTASSIUM CHLORIDE CRYS ER 10 MEQ PO TBCR: 20.0000 meq | EXTENDED_RELEASE_TABLET | Freq: Every day | ORAL | 0 refills | Status: DC | PRN

## 2022-06-08 MED ORDER — "INSULIN SYRINGE-NEEDLE U-100 31G X 5/16"" 1 ML MISC": 1.0000 | Freq: Two times a day (BID) | 11 refills | Status: DC

## 2022-06-08 MED ORDER — EMPAGLIFLOZIN 10 MG PO TABS: 10.0000 mg | ORAL_TABLET | Freq: Every day | ORAL | 0 refills | Status: DC

## 2022-06-08 MED ORDER — NITROGLYCERIN 0.4 MG SL SUBL: 0.4000 mg | SUBLINGUAL_TABLET | SUBLINGUAL | 3 refills | Status: DC | PRN

## 2022-06-08 MED ORDER — FUROSEMIDE 40 MG PO TABS: 40.0000 mg | ORAL_TABLET | Freq: Every day | ORAL | 1 refills | Status: DC | PRN

## 2022-06-08 MED ORDER — INSULIN NPH ISOPHANE & REGULAR (70-30) 100 UNIT/ML ~~LOC~~ SUSP: 10.0000 [IU] | Freq: Two times a day (BID) | SUBCUTANEOUS | 11 refills | Status: DC

## 2022-06-08 NOTE — Progress Notes (Signed)
Physical Therapy Treatment Patient Details Name: Robert Burnett MRN: [id number] DOB: 02/11/1961 Today's Date: 06/08/2022   History of Present Illness Pt is a 61 y/o male admitted secondary to chest pain. Pt with NSTEMI. Pt also with L foot wound. PMH includes L transmet amputation, HTN, and DM.    PT Comments    Patient requiring minguard assist with crutches due to slight imbalance as transferring and for safety with steps. Pt has tendency to put his Left heel down (in camboot) to steady himself and appears to be only TDWB'g when he does this. He is aware he is supposed to be NWB and can demonstrate NWB at times during session. He states he has to put his heel down to avoid potential major loss of balance and fall. OT trialed knee walker with pt earlier today and he is considering purchasing. States he will have someone to assist him on the steps whenever he has to go in/out. OK to discharge from PT standpoint and RN made aware.     Recommendations for follow up therapy are one component of a multi-disciplinary discharge planning process, led by the attending physician.  Recommendations may be updated based on patient status, additional functional criteria and insurance authorization.  Follow Up Recommendations  Home health PT (pt prefers to go home)     Assistance Recommended at Discharge Intermittent Supervision/Assistance  Patient can return home with the following A little help with walking and/or transfers;A little help with bathing/dressing/bathroom;Assistance with cooking/housework;Help with stairs or ramp for entrance;Assist for transportation   Equipment Recommendations  None recommended by PT    Recommendations for Other Services       Precautions / Restrictions Precautions Precautions: Fall Required Braces or Orthoses: Other Brace Other Brace: CAM boot Restrictions Weight Bearing Restrictions: Yes LLE Weight Bearing: Non weight bearing     Mobility  Bed  Mobility Overal bed mobility: Modified Independent                  Transfers Overall transfer level: Needs assistance Equipment used: Crutches Transfers: Sit to/from Stand Sit to Stand: Min guard           General transfer comment: Stood before he had crutches in his hands with need to hold onto bed rail; as reaching for crutches he began to lose his balance and put his left heel down, despite cues to remain NWB    Ambulation/Gait Ambulation/Gait assistance: Min guard Gait Distance (Feet): 20 Feet Assistive device: Crutches Gait Pattern/deviations: Step-to pattern Gait velocity: Decreased     General Gait Details: Cues to maintain NWB. tends to be TDWB thru heel in camboot; pt able to verbalize NWB, but states he just has to put his heel down and he'd rather touch the heel down than fall (agreed)   Stairs Stairs: Yes Stairs assistance: Min guard Stair Management: No rails, Forwards, With crutches Number of Stairs: 1 (done twice) General stair comments: pt again touches L heel down as he advnaces RLE up the step, then immediately raise LLE to NWB; able to maintain NWB LLE as he descends step   Wheelchair Mobility    Modified Rankin (Stroke Patients Only)       Balance Overall balance assessment: Needs assistance Sitting-balance support: No upper extremity supported, Feet supported Sitting balance-[name]: Good     Standing balance support: Bilateral upper extremity supported Standing balance-[name]: Poor Standing balance comment: Reliant on BUE support  Cognition Arousal/Alertness: Awake/alert Behavior During Therapy: WFL for tasks assessed/performed Overall Cognitive Status: Within Functional Limits for tasks assessed                                 General Comments: he is putting too much weight on LLE at times --he reports he is just balancing or putting weight on hee; does appear to be  TDWB thru heel        Exercises      General Comments General comments (skin integrity, edema, etc.): Pt verbalizes understanding of importance of NWB with healing of wound; discussed option of knee scooter (tried during OT session) and pt still considering if he will purchase      Pertinent Vitals/Pain Pain Assessment Pain Assessment: Faces Faces Pain Scale: Hurts little more Pain Location: back Pain Descriptors / Indicators: Guarding Pain Intervention(s): Limited activity within patient's tolerance, Monitored during session    Home Living Family/patient expects to be discharged to:: Private residence Living Arrangements: Other relatives Available Help at Discharge: Family;Available PRN/intermittently Type of Home: House Home Access: Stairs to enter Entrance Stairs-Rails: None Entrance Stairs-Number of Steps: small step and 6 inch step   Home Layout: Two level;Able to live on main level with bedroom/bathroom Home Equipment: Rolling Walker (2 wheels);Wheelchair - manual;BSC/3in1;Crutches Additional Comments: nephew works, just had hip surgery;    Prior Function            PT Goals (current goals can now be found in the care plan section) Acute Rehab PT Goals Patient Stated Goal: to go home Time For Goal Achievement: 06/21/22 Potential to Achieve Goals: Good Progress towards PT goals: Progressing toward goals    Frequency    Min 3X/week      PT Plan Current plan remains appropriate    Co-evaluation              AM-PAC PT "6 Clicks" Mobility   Outcome Measure  Help needed turning from your back to your side while in a flat bed without using bedrails?: None Help needed moving from lying on your back to sitting on the side of a flat bed without using bedrails?: None Help needed moving to and from a bed to a chair (including a wheelchair)?: A Little Help needed standing up from a chair using your arms (e.g., wheelchair or bedside chair)?: A Little Help  needed to walk in hospital room?: A Little Help needed climbing 3-5 steps with a railing? : A Lot 6 Click Score: 19    End of Session Equipment Utilized During Treatment: Gait belt Activity Tolerance: Patient tolerated treatment well Patient left: in bed;with call bell/phone within reach Nurse Communication: Mobility status PT Visit Diagnosis: Unsteadiness on feet (R26.81);Muscle weakness (generalized) (M62.81)     Time: 9417-4081 PT Time Calculation (min) (ACUTE ONLY): 13 min  Charges:  $Gait Training: 8-22 mins                      Arby Barrette, PT Acute Rehabilitation Services  Office [phone]    Rexanne Mano 06/08/2022, 11:11 AM

## 2022-06-08 NOTE — Progress Notes (Signed)
Orthopedic Tech Progress Note Patient Details:  Robert Burnett [birth year] [id number]  Ambulates well with crutches per PT.   Ortho Devices Type of Ortho Device: Crutches Ortho Device/Splint Location: at bedside Ortho Device/Splint Interventions: Ordered, Adjustment   Post Interventions Patient Tolerated: Well Instructions Provided: Care of device, Adjustment of device, Poper ambulation with device  [name] [name] 06/08/2022, 11:12 AM

## 2022-06-08 NOTE — Progress Notes (Signed)
Progress Note  Patient Name: Robert Burnett Date of Encounter: 06/08/2022  [facility] Cardiologist: Sherren Mocha, MD   Subjective   Very mild short episode of SOB   Inpatient Medications    Scheduled Meds:  apixaban  5 mg Oral BID   aspirin  81 mg Oral Daily   atorvastatin  80 mg Oral Daily   cephALEXin  500 mg Oral Q8H   clopidogrel  75 mg Oral Daily   empagliflozin  10 mg Oral Daily   ezetimibe  10 mg Oral Daily   fentaNYL  1 patch Transdermal Q72H   finasteride  5 mg Oral Daily   insulin aspart  0-15 Units Subcutaneous TID AC & HS   insulin aspart protamine- aspart  10 Units Subcutaneous BID WC   leptospermum manuka honey  1 Application Topical Daily   lidocaine  1 patch Transdermal Q24H   loratadine  10 mg Oral Daily   metoprolol succinate  75 mg Oral Daily   pantoprazole  40 mg Oral Daily   polyethylene glycol  17 g Oral BID   sodium chloride flush  3 mL Intravenous Q12H   sodium chloride flush  3 mL Intravenous Q12H   Continuous Infusions:  sodium chloride     PRN Meds: sodium chloride, acetaminophen, acetaminophen, melatonin, morphine injection, ondansetron (ZOFRAN) IV, ondansetron (ZOFRAN) IV, oxyCODONE, pantoprazole, sodium chloride flush   Vital Signs    Vitals:   06/07/22 1806 06/07/22 2207 06/08/22 0100 06/08/22 0542  BP:  126/77 129/74 123/79  Pulse:   60   Resp: '19 18 15 19  '$ Temp:  97.9 F (36.6 C) 98.1 F (36.7 C) 98.3 F (36.8 C)  TempSrc:  Oral Oral Oral  SpO2:  97% 97% 98%  Weight:    81.1 kg  Height:        Intake/Output Summary (Last 24 hours) at 06/08/2022 0728 Last data filed at 06/08/2022 0700 Gross per 24 hour  Intake 1083 ml  Output 3400 ml  Net -2317 ml      06/08/2022    5:42 AM 06/07/2022    5:22 AM 06/06/2022    4:44 AM  Last 3 Weights  Weight (lbs) 178 lb 14.4 oz 178 lb 9.2 oz 182 lb 15.7 oz  Weight (kg) 81.149 kg 81 kg 83 kg      Telemetry    SR - Personally Reviewed  ECG    N/a - Personally  Reviewed  Physical Exam   GEN: No acute distress.   Neck: No JVD Cardiac: RRR, no murmurs, rubs, or gallops.  Respiratory: Clear to auscultation bilaterally. GI: Soft, nontender, non-distended  MS: No edema; No deformity. Neuro:  Nonfocal  Psych: Normal affect   Labs    High Sensitivity Troponin:   Recent Labs  Lab 06/04/22 1517 06/04/22 1713 06/04/22 1909 06/04/22 2127  TROPONINIHS 809* 817* 654* 549*     Chemistry Recent Labs  Lab 06/04/22 1517 06/05/22 0355 06/06/22 0338 06/07/22 0223  NA 136 131* 133* 136  K 4.0 4.3 3.8 4.2  CL 103 100 97* 104  CO2 '23 22 22 24  '$ GLUCOSE 119* 157* 168* 176*  BUN '17 18 17 22  '$ CREATININE 1.17 1.24 1.46* 1.44*  CALCIUM 9.2 8.7* 8.9 8.8*  MG 1.6* 1.9  --   --   GFRNONAA >60 >60 54* 55*  ANIONGAP '10 9 14 8    '$ Lipids  Recent Labs  Lab 06/05/22 0355  CHOL 188  TRIG 118  HDL  38*  LDLCALC 126*  CHOLHDL 4.9    Hematology Recent Labs  Lab 06/05/22 0355 06/06/22 0338 06/07/22 0223  WBC 5.0 5.4 3.5*  RBC 3.51* 3.41* 3.47*  HGB 9.9* 9.7* 9.7*  HCT 30.1* 29.3* 29.6*  MCV 85.8 85.9 85.3  MCH 28.2 28.4 28.0  MCHC 32.9 33.1 32.8  RDW 14.1 14.2 14.2  PLT 128* 140* 117*   Thyroid  Recent Labs  Lab 06/07/22 0223  TSH 1.744  FREET4 0.70    BNPNo results for input(s): "BNP", "PROBNP" in the last 168 hours.  DDimer  Recent Labs  Lab 06/04/22 1713  DDIMER 0.88*     Radiology    No results found.  Cardiac Studies     Patient Profile     61 y.o. 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   Assessment & Plan    1.CAD/NSTEMI/ICM - Hs trop 809 >817 >654 - EKG showed old anterior TWI - LHC 06/05/22 showed 99% proximal LAD s/p stent - Echo 06/05/22 showed LVEF  40-45%, Severe hypokinesis of the left ventricular, mid-apical anteroseptal wall, anterior wall and apical segment; Mild MR   -medical therapy with ASA 81, atorva 80, plavix 75, zetia 10, toprol 75, jardiance 10. With AKI not on ACE/ARB/ARNI/aldactone at this time. Pending labs today could add low dose losartan, transition to entresto as outpatient pending f/u labs.  - on eliquis for afib as well, plan 1 month triple therapy ASA/plavix/eliquis then stop aspirin   2.Afib - continue toprol, eliquis     3. SOB - improved with diuresis, also changing from brillinta to plavix.  - gave additional dose of IV lasix yesterday and symptoms further improved - would d/c with oral lasix '20mg'$  prn     F/u labs, if Cr improved start losartan 12.'5mg'$  daily. If unchanged would not start and reconsider as outpatient. [place] for d/c today.   For questions or updates, please contact [employer] Please consult [email or web address] for contact info under        Signed, Carlyle Dolly, MD  06/08/2022, 7:28 AM

## 2022-06-08 NOTE — Evaluation (Signed)
Occupational Therapy Evaluation and Discharge Patient Details Name: Robert Burnett MRN: [id number] DOB: 04/26/1961 Today's Date: 06/08/2022   History of Present Illness Pt is a 61 y/o male admitted secondary to chest pain. Pt with NSTEMI. Pt also with L foot wound. PMH includes L transmet amputation, HTN, and DM.   Clinical Impression   This 60 yo male admitted with above presents to acute OT with all OT education completed and all DME ordered for home use. He is at an overall S level when up on crutches for basic ADLs and mobility with possibly at times putting weight on LLE. No further OT needs, we will sign off.   He asked about signing up for MyChart. I tried to help him but it would not identify him online after answering the questions so wrote down phone number for him to call go get up help with setting it up.     Recommendations for follow up therapy are one component of a multi-disciplinary discharge planning process, led by the attending physician.  Recommendations may be updated based on patient status, additional functional criteria and insurance authorization.   Follow Up Recommendations  No OT follow up    Assistance Recommended at Discharge Frequent or constant Supervision/Assistance  Patient can return home with the following A little help with walking and/or transfers;A little help with bathing/dressing/bathroom;Assistance with cooking/housework;Assist for transportation;Help with stairs or ramp for entrance    Functional Status Assessment  Patient has had a recent decline in their functional status and demonstrates the ability to make significant improvements in function in a reasonable and predictable amount of time. (without further OT needs post evaluation--all education completed)  Equipment Recommendations  Tub/shower bench;Other (comment) (crutches)       Precautions / Restrictions Precautions Precautions: Fall Required Braces or Orthoses: Other Brace Other  Brace: CAM boot Restrictions Weight Bearing Restrictions: Yes LLE Weight Bearing: Non weight bearing      Mobility Bed Mobility Overal bed mobility: Modified Independent                  Transfers Overall transfer level: Needs assistance Equipment used: Crutches (knee walker) Transfers: Sit to/from Stand Sit to Stand: Supervision, Min guard           General transfer comment: S with crutches, min guard A with knee walker      Balance Overall balance assessment: Needs assistance Sitting-balance support: No upper extremity supported, Feet supported Sitting balance-[name]: Good     Standing balance support: Bilateral upper extremity supported Standing balance-[name]: Poor Standing balance comment: Reliant on BUE support                           ADL either performed or assessed with clinical judgement   ADL Overall ADL's : Modified independent                                       General ADL Comments: Pt reported he dressed himself (he was fully clothed when I entered). He did put on his Cam boot while I was there. He is able to ambulate with crutches at a S level. We had long discussion on what  tub DME may be best for him (I showed him pictures on line as well as looked up dimensions for him. He ultimately decided on the tub transfer bench so  I explained to him how to adjust to his needs at home and how to manage the shower curtain--he is aware he cannot get his left foot wet. He also had questions about cost of this and of crutches so multiple chat texts with CM about this and pt's insurance while I was in room with patient. He also tried to knee scooter which he was able to use at a min gurad A level (he kept having to stop due to a cramp in his left calf). He now knows how it works and will decide on his own if he will get on online.     Vision Patient Visual Report: No change from baseline              Pertinent  Vitals/Pain Pain Assessment Pain Assessment: No/denies pain     Hand Dominance Right   Extremity/Trunk Assessment Upper Extremity Assessment Upper Extremity Assessment: Overall WFL for tasks assessed   Lower Extremity Assessment LLE Deficits / Details: L foot in CAM boot       Communication Communication Communication: No difficulties   Cognition Arousal/Alertness: Awake/alert Behavior During Therapy: WFL for tasks assessed/performed Overall Cognitive Status: Within Functional Limits for tasks assessed                                 General Comments: appears he may be putting too much weight on LLE at times --he reports he is just balancing or putting weight on heal                Home Living Family/patient expects to be discharged to:: Private residence Living Arrangements: Other relatives Available Help at Discharge: Family;Available PRN/intermittently Type of Home: House Home Access: Stairs to enter Entrance Stairs-Number of Steps: small step and 6 inch step Entrance Stairs-Rails: None Home Layout: Two level;Able to live on main level with bedroom/bathroom     Bathroom Shower/Tub: Tub/shower unit;Curtain   Bathroom Toilet: Handicapped height     Home Equipment: Conservation officer, nature (2 wheels);Wheelchair - manual;BSC/3in1;Crutches   Additional Comments: nephew works, just had hip surgery;      Prior Functioning/Environment Prior Level of Function : Independent/Modified Independent                        OT Problem List: Decreased range of motion;Impaired balance (sitting and/or standing)         OT Goals(Current goals can be found in the care plan section) Acute Rehab OT Goals Patient Stated Goal: to be able to walk again sooner than later on left foot         AM-PAC OT "6 Clicks" Daily Activity     Outcome Measure Help from another person eating meals?: None Help from another person taking care of personal grooming?: A Little  (overall at a S level from crutch level) Help from another person toileting, which includes using toliet, bedpan, or urinal?: A Little Help from another person bathing (including washing, rinsing, drying)?: A Little Help from another person to put on and taking off regular upper body clothing?: A Little Help from another person to put on and taking off regular lower body clothing?: A Little 6 Click Score: 19   End of Session Equipment Utilized During Treatment: Gait belt (crutches, knee walker) Nurse Communication: Mobility status (equipment needs, PT to see for stairs)  Activity Tolerance: Patient tolerated treatment well Patient left: in bed (with  urinal at bedside)  OT Visit Diagnosis: Unsteadiness on feet (R26.81);Other abnormalities of gait and mobility (R26.89)                Time: 3818-2993 OT Time Calculation (min): 63 min Charges:  OT General Charges $OT Visit: 1 Visit OT Evaluation $OT Eval Moderate Complexity: 1 Mod OT Treatments $Self Care/Home Management : 38-52 mins  Golden Circle, OTR/L [facility] Re[facility] [phone] Office [phone]    Almon Register 06/08/2022, 10:59 AM

## 2022-06-08 NOTE — Discharge Summary (Addendum)
Physician Discharge Summary   Patient: Robert Burnett MRN: [id number] DOB: 08/30/1961  Admit date:     06/04/2022  Discharge date: 06/08/22  Discharge Physician: Elmarie Shiley   PCP: [name], MD   Recommendations at discharge:   Needs Bmet to monitor renal function Follow up with cardiology post stent placement Follow up with Podiatry for chronic foot wound.   Discharge Diagnoses: Principal Problem:   NSTEMI (non-ST elevated myocardial infarction) ([place]) Active Problems:   Hypomagnesemia   Diabetic ulcer of left foot (HCC)   Type 2 diabetes mellitus with complication, with long-term current use of insulin ([place])   Hypertension   Hyperlipidemia   Chest pain  Resolved Problems:   * No resolved hospital problems. 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  He is a status post LAD stent placement 06/06/2022.   He has received IV Lasix as well for heart failure exacerbation. He will be discharge on PRN lasix. He feels better, denies dyspnea. Plan to discharge home today.    Assessment and Plan:   1-Non-STEMI ischemic cardiomyopathy ejection fraction 40 to 45% Presents  with chest pain and elevation of troponin. (549) Status post stent to LAD on 8/17 Continue with aspirin, Brilinta was changed to Plavix On Eliquis On metoprolol Chest pain free.   PAF new diagnosis On metoprolol and Eliquis   Acute Systolic heart failure exacerbation By CT moderate bilateral pleural effusion, hazy attenuation lungs bilaterally, possible edema or infiltrates. He complaining of shortness of breath on admission.  He received IV lasix on 8/17. He develops SOB 8/19 Received 20 Mg IV lasix 8/19. Cr mildly elevated today.  Plan to discharge on PRN lasix, to take for more than 2 pound of weight gain.    AKI on CKD stage II: Creatinine increased from 1.2-1.4 UA trace protein, follow-up outpatient Cr increase to 1.5 after lasix yesterday.  Close follow up out patient.    Hyponatremia: Likely from volume overload.   Resolved.   Hypomagnesemia: Replaced   Diabetes type 2 with complication with long-term current use of insulin: Uncontrolled A1c 14 03/2022 On long-acting insulin, sliding scale.  Resume 70/30 at discharge   Diabetic ulcer of the left foot: Status post transmetatarsal amputation of the left foot 03/2022 ABI normal 03/2022 1 (obese foot since a week ago x-ray diffuse soft tissue swelling) Doppler negative for DVT Dr Earleen Newport consulted recommended Keflex for 7 days Unna boot   Mild Thrombocytopenia; monitor.    Estimated body mass index is 24.22 kg/m as calculated from the following:   Height as of this encounter: 6' (1.829 m).   Weight as of this encounter: 81 kg.            Consultants: Cardiology, Dr Shanda Bumps.  Procedures performed: Cath.   Disposition: Home Diet recommendation:  Discharge Diet Orders (From admission, onward)     Start     Ordered   06/08/22 0000  Diet - low sodium heart healthy        06/08/22 1034           Cardiac diet DISCHARGE MEDICATION: Allergies as of 06/08/2022       Reactions   Amlodipine  Rash   Prozac [fluoxetine Hcl] Rash        Medication List     STOP taking these medications    aspirin 325 MG tablet Replaced by: Aspirin Low Dose 81 MG tablet   atenolol 100 MG tablet Commonly known as: TENORMIN   NovoLIN 70/30 Kwikpen (70-30) 100 UNIT/ML KwikPen Generic drug: insulin isophane & regular human KwikPen Replaced by: insulin NPH-regular Human (70-30) 100 UNIT/ML injection       TAKE these medications    Aspirin Low  Dose 81 MG tablet Generic drug: aspirin EC Take 1 tablet (81 mg total) by mouth daily. Swallow whole. For 30 days then stop Replaces: aspirin 325 MG tablet   atorvastatin 80 MG tablet Commonly known as: LIPITOR Take 80 mg by mouth daily.   cephALEXin 500 MG capsule Commonly known as: KEFLEX Take 1 capsule (500 mg total) by mouth every 8 (eight) hours for 7 days.   clopidogrel 75 MG tablet Commonly known as: PLAVIX Take 1 tablet (75 mg total) by mouth daily.   Eliquis 5 MG Tabs tablet Generic drug: apixaban Take 1 tablet (5 mg total) by mouth 2 (two) times daily.   empagliflozin 10 MG Tabs tablet Commonly known as: JARDIANCE Take 1 tablet (10 mg total) by mouth daily. Start taking on: June 09, 2022   ezetimibe 10 MG tablet Commonly known as: ZETIA Take 1 tablet (10 mg total) by mouth daily.   fentaNYL 75 MCG/HR Commonly known as: [place] 1 patch onto the skin every 3 (three) days.   finasteride 5 MG tablet Commonly known as: PROSCAR Take 5 mg by mouth daily.   freestyle lancets 1 each by Other route 3 (three) times daily.   FREESTYLE LITE test strip Generic drug: glucose blood 1 each by Other route 3 (three) times daily.   furosemide 40 MG tablet Commonly known as: Lasix Take 1 tablet (40 mg total) by mouth daily as needed for edema or fluid (Take 1 tablet if your weight increases more than 2 pounds in 24 hours.).   insulin NPH-regular Human (70-30) 100 UNIT/ML injection Inject 10 Units into the skin 2 (two) times daily with a meal. RELION BRAND PLEASE Replaces: NovoLIN 70/30 Kwikpen (70-30) 100 UNIT/ML KwikPen   Insulin Syringe-Needle U-100 31G X 5/16" 1 ML Misc Commonly known as: RELION INSULIN SYRINGE 1ML/31G 1 each by Does not apply route in the morning and at bedtime. Use to inject Insulin 70/30   metoprolol succinate 25 MG 24 hr tablet Commonly known as: TOPROL-XL Take 3 tablets (75 mg total) by mouth daily.   nitroGLYCERIN 0.4 MG SL  tablet Commonly known as: Nitrostat Place 1 tablet (0.4 mg total) under the tongue every 5 (five) minutes as needed for chest pain.   pantoprazole 40 MG tablet Commonly known as: PROTONIX Take 1 tablet (40 mg total) by mouth daily. What changed:  when to take this reasons to take this   Pen Needles 3/16" 31G X 5 MM Misc 28 Units by Does not apply route 2 (two) times daily.   polyethylene glycol 17 g packet Commonly known as: MIRALAX / GLYCOLAX Take 17 g by mouth 2 (two) times daily.   potassium chloride 10 MEQ tablet Commonly known as: KLOR-CON M Take 2 tablets (20 mEq total) by mouth daily as needed (Take the days  you need to  take lasix.).               Durable Medical Equipment  (From admission, onward)  Start     Ordered   06/08/22 1031  For home use only DME Tub bench  Once        06/08/22 1030            Follow-up Information     Kate Sable, MD Follow up on 06/26/2022.   Specialties: Cardiology, Radiology Why: at 1100 AM Contact information: [place] [address] [address], [place] [facility] Follow up.   Why: (Adapt)- tub bench- to be delivered to room prior to discharge Contact information: [place] [address] [phone]                Discharge Exam: Filed Weights   06/06/22 0444 06/07/22 0522 06/08/22 0542  Weight: 83 kg 81 kg 81.1 kg   General; NAD  Condition at discharge: stable  The results of significant diagnostics from this hospitalization (including imaging, microbiology, ancillary and laboratory) are listed below for reference.   Imaging Studies: VAS [place] LOWER EXTREMITY VENOUS (DVT) (7a-7p)  Result Date: 06/08/2022  Lower Venous DVT Study Patient Name:  JULEON NARANG  Date of Exam:   06/04/2022 Medical Rec #: [id number]         Accession #:    [id number] Date of Birth: 02-21-1961          Patient Gender: M Patient Age:   5 years Exam Location:   [facility] Procedure:      VAS [place] LOWER EXTREMITY VENOUS (DVT) Referring Phys: Campbell Stall --------------------------------------------------------------------------------  Indications: Chest pain and LLE swelling.  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 Comparison Study: No previous exams Performing Technologist: Jody Hill RVT, RDMS  Examination Guidelines: A complete evaluation includes B-mode imaging, spectral Doppler, color Doppler, and power Doppler as needed of all accessible portions of each vessel. Bilateral testing is considered an integral part of a complete examination. Limited examinations for reoccurring indications may be performed as noted. The reflux portion of the exam is performed with the patient in reverse Trendelenburg.  +-----+---------------+---------+-----------+----------+--------------+ RIGHTCompressibilityPhasicitySpontaneityPropertiesThrombus Aging +-----+---------------+---------+-----------+----------+--------------+ CFV  Full           No       Yes                                 +-----+---------------+---------+-----------+----------+--------------+ Pulsatile doppler waveforms  +---------+---------------+---------+-----------+----------+-------------------+ LEFT     CompressibilityPhasicitySpontaneityPropertiesThrombus Aging      +---------+---------------+---------+-----------+----------+-------------------+ CFV      Full           No       Yes                                      +---------+---------------+---------+-----------+----------+-------------------+ SFJ      Full                                                             +---------+---------------+---------+-----------+----------+-------------------+ FV Prox  Full           No       Yes                                      +---------+---------------+---------+-----------+----------+-------------------+  FV Mid   Full           No       Yes                                       +---------+---------------+---------+-----------+----------+-------------------+ FV DistalFull           No       Yes                                      +---------+---------------+---------+-----------+----------+-------------------+ PFV      Full                                                             +---------+---------------+---------+-----------+----------+-------------------+ POP      Full           No       Yes                                      +---------+---------------+---------+-----------+----------+-------------------+ PTV      Full                                                             +---------+---------------+---------+-----------+----------+-------------------+ PERO                                                  Not well visualized +---------+---------------+---------+-----------+----------+-------------------+    Summary: RIGHT: - No evidence of common femoral vein obstruction.  LEFT: - There is no evidence of deep vein thrombosis in the lower extremity.  - No cystic structure found in the popliteal fossa. - Ultrasound characteristics of enlarged lymph nodes noted in the groin. Subcutaneous edema of calf and ankle.  *See table(s) above for measurements and observations. Electronically signed by Jamelle Haring on 06/08/2022 at 2:44:16 PM.    Final    ECHOCARDIOGRAM LIMITED  Result Date: 06/05/2022    ECHOCARDIOGRAM LIMITED REPORT   Patient Name:   BRAETON WOLGAMOTT Date of Exam: 06/05/2022 Medical Rec #:  [id number]        Height:       72.0 in Accession #:    [id number]       Weight:       176.4 lb Date of Birth:  [birth year]         BSA:          2.020 m Patient Age:    53 years         BP:           122/77 mmHg Patient Gender: M                HR:  79 bpm. Exam Location:  Inpatient Procedure: Limited Echo, Limited Color Doppler and Cardiac Doppler Indications:    CHF-Acute Systolic C58.85  History:        Patient  has prior history of Echocardiogram examinations, most                 recent 03/27/2022. Risk Factors:Hypertension, Diabetes,                 Dyslipidemia and Tobacco abuse (From Hx). NSTEMI this admit.  Sonographer:    Alvino Chapel RCS Referring Phys: [place]  1. Limted for heart failure  2. No apical thrombus with Definity contrast. Left ventricular ejection fraction, by estimation, is 40 to 45%. Left ventricular ejection fraction by 2D MOD biplane is 42.6 %. The left ventricle has mildly decreased function. The left ventricle demonstrates regional wall motion abnormalities (see scoring diagram/findings for description). There is severe hypokinesis of the left ventricular, mid-apical anteroseptal wall, anterior wall and apical segment. This is consistent with LAD territory ischemia/infarct.  3. The mitral valve is abnormal. Mild mitral valve regurgitation. Comparison(s): Changes from prior study are noted. 03/27/2022: LVEF 55-60%, normal wall motion. FINDINGS  Left Ventricle: No apical thrombus with Definity contrast. Left ventricular ejection fraction, by estimation, is 40 to 45%. Left ventricular ejection fraction by 2D MOD biplane is 42.6 %. The left ventricle has mildly decreased function. The left ventricle demonstrates regional wall motion abnormalities. Severe hypokinesis of the left ventricular, mid-apical anteroseptal wall, anterior wall and apical segment. Definity contrast agent was given IV to delineate the left ventricular endocardial borders. The left ventricular internal cavity size was normal in size. There is no left ventricular hypertrophy. Mitral Valve: The mitral valve is abnormal. There is mild thickening of the anterior and posterior mitral valve leaflet(s). Mild mitral valve regurgitation. LEFT VENTRICLE PLAX 2D                        Biplane EF (MOD) LVIDd:         5.00 cm         LV Biplane EF:   Left LVIDs:         3.90 cm                          ventricular LV PW:          1.10 cm                          ejection LV IVS:        1.00 cm                          fraction by                                                 2D MOD                                                 biplane is LV Volumes (MOD)  42.6 %. LV vol d, MOD    131.0 ml A2C: LV vol d, MOD    158.0 ml A4C: LV vol s, MOD    81.2 ml A2C: LV vol s, MOD    94.8 ml A4C: LV SV MOD A2C:   49.8 ml LV SV MOD A4C:   158.0 ml LV SV MOD BP:    64.3 ml RIGHT VENTRICLE TAPSE (M-mode): 2.7 cm LEFT ATRIUM         Index LA diam:    4.20 cm 2.08 cm/m   AORTA Ao Root diam: 3.40 cm MITRAL VALVE MV Area (PHT): 4.80 cm MV Decel Time: 158 msec MV E velocity: 97.80 cm/s MV A velocity: 31.20 cm/s MV E/A ratio:  3.13 Lyman Bishop MD Electronically signed by Lyman Bishop MD Signature Date/Time: 06/05/2022/4:22:09 PM    Final    CARDIAC CATHETERIZATION  Result Date: 06/05/2022 CONCLUSIONS: 99% proximal LAD treated with overlapping Synergy stents dilated to 3.5 mm in diameter (3.5 x 16 proximal and overlapped distally with a 3.0 x 12).  TIMI grade II flow increased to TIMI grade III flow postprocedure.  Very distal stent margin has persistent eccentric 30 to 40% narrowing. Left main widely patent Circumflex gives origin to 4 obtuse marginals.  The third obtuse marginal is large and free of obstruction. Right coronary is dominant and contains proximal 25% narrowing. Left ventricular angiography by hand-injection demonstrated severe anteroapical hypokinesis with EF in the 35% range. Ongoing post procedural pain without acute EKG changes.  Does have anterior T wave inversion as he did prior to the procedure.  Also states that he has had chronic back pain that causes chest discomfort. RECOMMENDATIONS: IV nitroglycerin until pain controlled. Aspirin and Brilinta for 12 months. Distinguish whether ongoing chest discomfort is thoracic spine versus ongoing ischemia.  Suspect thoracic pain from disc disease.   CT  Angio Chest PE W and/or Wo Contrast  Result Date: 06/04/2022 CLINICAL DATA:  Pulmonary embolism suspected, high probability. Chest pain and shortness of breath. EXAM: CT ANGIOGRAPHY CHEST WITH CONTRAST TECHNIQUE: Multidetector CT imaging of the chest was performed using the standard protocol during bolus administration of intravenous contrast. Multiplanar CT image reconstructions and MIPs were obtained to evaluate the vascular anatomy. RADIATION DOSE REDUCTION: This exam was performed according to the departmental dose-optimization program which includes automated exposure control, adjustment of the mA and/or kV according to patient size and/or use of iterative reconstruction technique. CONTRAST:  138m OMNIPAQUE IOHEXOL 350 MG/ML SOLN COMPARISON:  11/28/2020. FINDINGS: Cardiovascular: Heart is borderline enlarged and there is no pericardial effusion. Multi-vessel coronary artery calcifications are noted. There is atherosclerotic calcification of the aorta without evidence of aneurysm. The pulmonary trunk is normal in caliber. No pulmonary artery filling defect is identified. Mediastinum/Nodes: Enlarged lymph nodes are present in the mediastinum and hilar regions bilaterally. No axillary lymphadenopathy. The thyroid gland, trachea, and esophagus are within normal limits. Lungs/Pleura: There are moderate bilateral pleural effusions, greater on the right than on the left. Hazy ground-glass attenuation is noted in the lungs bilaterally. Patchy atelectasis or infiltrate is noted in the lower lobes. No pneumothorax. Upper Abdomen: No acute abnormality. Musculoskeletal: Bilateral gynecomastia is noted. Degenerative changes are present in the thoracic spine. No acute osseous abnormality. Review of the MIP images confirms the above findings. IMPRESSION: 1. No evidence of pulmonary embolism. 2. Hazy attenuation in the lungs bilaterally, possible edema or infiltrate. 3. Moderate bilateral pleural effusions, greater on the  right than on the left with patchy atelectasis  or infiltrate at the lung bases. 4. Mediastinal and axillary lymphadenopathy, may be reactive. 5. Coronary artery calcifications. 6. Aortic atherosclerosis. Electronically Signed   By: Brett Fairy M.D.   On: 06/04/2022 21:48   DG Foot 2 Views Left  Result Date: 06/04/2022 CLINICAL DATA:  Foot ulcer.  Chest pain. EXAM: LEFT FOOT - 2 VIEW COMPARISON:  Left foot x-ray 03/25/2022 FINDINGS: There has been interval amputation of the first through fifth metatarsals and phalanges. There is soft tissue swelling of the foot. There is no acute fracture or dislocation. No definite cortical erosions are identified. There are degenerative changes of the dorsal midfoot and talonavicular joint. The bones are osteopenic. IMPRESSION: 1. 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 2. Diffuse soft tissue swelling of the foot. 3. No acute bony abnormality identified. If there is high clinical concern for osteomyelitis, an MRI is more sensitive. Electronically Signed   By: Ronney Asters M.D.   On: 06/04/2022 20:15   DG Chest Portable 1 View  Result Date: 06/04/2022 CLINICAL DATA:  Chest pain EXAM: PORTABLE CHEST 1 VIEW COMPARISON:  03/25/2022 FINDINGS: Mild cardiomegaly. Mild, diffuse bilateral interstitial pulmonary opacity. The visualized skeletal structures are unremarkable. IMPRESSION: Mild cardiomegaly with mild, diffuse bilateral interstitial pulmonary opacity, likely edema. No focal airspace opacity. Electronically Signed   By: Delanna Ahmadi M.D.   On: 06/04/2022 15:11    Microbiology: Results for orders placed or performed during the hospital encounter of 03/24/22  Blood Cultures x 2 sites     Status: Abnormal   Collection Time: 03/25/22 12:30 AM   Specimen: BLOOD RIGHT WRIST  Result Value Ref Range Status   Specimen Description   Final    BLOOD RIGHT WRIST Performed at [facility] [address]  [place], [place] [address]    Special Requests   Final    BOTTLES DRAWN AEROBIC AND ANAEROBIC Blood Culture adequate volume Performed at [facility] [address], [place], [place] [address]    Culture  Setup Time   Final    GRAM POSITIVE COCCI IN BOTH AEROBIC AND ANAEROBIC BOTTLES CRITICAL RESULT CALLED TO, READ BACK BY AND VERIFIED WITH: PHARMD CHRISTINE SHADE ON 03/25/22 @ 1625 BY DRT Performed at [facility], [place] [address], [place], [place] [address]    Culture GROUP B STREP(S.AGALACTIAE)ISOLATED (A)  Final   Report Status 03/27/2022 FINAL  Final   Organism ID, Bacteria GROUP B STREP(S.AGALACTIAE)ISOLATED  Final      Susceptibility   Group b strep(s.agalactiae)isolated - MIC*    CLINDAMYCIN >=1 RESISTANT Resistant     AMPICILLIN <=0.25 SENSITIVE Sensitive     ERYTHROMYCIN >=8 RESISTANT Resistant     VANCOMYCIN 0.5 SENSITIVE Sensitive     CEFTRIAXONE <=0.12 SENSITIVE Sensitive     LEVOFLOXACIN 0.5 SENSITIVE Sensitive     * GROUP B STREP(S.AGALACTIAE)ISOLATED  Blood Culture ID Panel (Reflexed)     Status: Abnormal   Collection Time: 03/25/22 12:30 AM  Result Value Ref Range Status   Enterococcus faecalis NOT DETECTED NOT DETECTED Final   Enterococcus Faecium NOT DETECTED NOT DETECTED Final   Listeria monocytogenes NOT DETECTED NOT DETECTED Final   Staphylococcus species NOT DETECTED NOT DETECTED Final   Staphylococcus aureus (BCID) NOT DETECTED NOT DETECTED Final   Staphylococcus epidermidis NOT DETECTED NOT DETECTED Final   Staphylococcus lugdunensis NOT DETECTED NOT DETECTED Final   Streptococcus species DETECTED (A) NOT DETECTED Final    Comment: CRITICAL RESULT CALLED  TO, READ BACK BY AND VERIFIED WITH: PHARMD CHRISTINE SHADE ON 03/25/22 @ 1625 BY DRT    Streptococcus agalactiae DETECTED (A) NOT DETECTED Final    Comment: CRITICAL RESULT CALLED TO, READ BACK BY AND VERIFIED WITH: PHARMD CHRISTINE SHADE ON 03/25/22 @ 1625 BY DRT    Streptococcus  pneumoniae NOT DETECTED NOT DETECTED Final   Streptococcus pyogenes NOT DETECTED NOT DETECTED Final   A.calcoaceticus-baumannii NOT DETECTED NOT DETECTED Final   Bacteroides fragilis NOT DETECTED NOT DETECTED Final   Enterobacterales NOT DETECTED NOT DETECTED Final   Enterobacter cloacae complex NOT DETECTED NOT DETECTED Final   Escherichia coli NOT DETECTED NOT DETECTED Final   Klebsiella aerogenes NOT DETECTED NOT DETECTED Final   Klebsiella oxytoca NOT DETECTED NOT DETECTED Final   Klebsiella pneumoniae NOT DETECTED NOT DETECTED Final   Proteus species NOT DETECTED NOT DETECTED Final   Salmonella species NOT DETECTED NOT DETECTED Final   Serratia marcescens NOT DETECTED NOT DETECTED Final   Haemophilus influenzae NOT DETECTED NOT DETECTED Final   Neisseria meningitidis NOT DETECTED NOT DETECTED Final   Pseudomonas aeruginosa NOT DETECTED NOT DETECTED Final   Stenotrophomonas maltophilia NOT DETECTED NOT DETECTED Final   Candida albicans NOT DETECTED NOT DETECTED Final   Candida auris NOT DETECTED NOT DETECTED Final   Candida glabrata NOT DETECTED NOT DETECTED Final   Candida krusei NOT DETECTED NOT DETECTED Final   Candida parapsilosis NOT DETECTED NOT DETECTED Final   Candida tropicalis NOT DETECTED NOT DETECTED Final   Cryptococcus neoformans/gattii NOT DETECTED NOT DETECTED Final    Comment: Performed at [facility], [place] [address] [place], [place] [address]  Blood Cultures x 2 sites     Status: Abnormal   Collection Time: 03/25/22 12:41 AM   Specimen: BLOOD LEFT HAND  Result Value Ref Range Status   Specimen Description   Final    BLOOD LEFT HAND Performed at [facility] [address] [place], [place] [address]    Special Requests   Final    BOTTLES DRAWN AEROBIC ONLY Blood Culture results may not be optimal due to an inadequate volume of blood received in culture bottles Performed at [facility] [address]  [place], [place] [address]    Culture (A)  Final    GROUP B STREP(S.AGALACTIAE)ISOLATED SUSCEPTIBILITIES PERFORMED ON PREVIOUS CULTURE WITHIN THE LAST 5 DAYS. Performed at [facility], [place] [address], [place], [place] [address]    Report Status 03/27/2022 FINAL  Final  MRSA Next Gen by PCR, Nasal     Status: Abnormal   Collection Time: 03/25/22  5:48 AM   Specimen: Nasal Mucosa; Nasal Swab  Result Value Ref Range Status   MRSA by PCR Next Gen DETECTED (A) NOT DETECTED Final    Comment: (NOTE) The GeneXpert MRSA Assay (FDA approved for NASAL specimens only), is one component of a comprehensive MRSA colonization surveillance program. It is not intended to diagnose MRSA infection nor to guide or monitor treatment for MRSA infections. Test performance is not FDA approved in patients less than 16 years old. Performed at [facility], [place] [address], [place],  [address]   Aerobic/Anaerobic Culture w Gram Stain (surgical/deep wound)     Status: None   Collection Time: 03/25/22  7:28 PM   Specimen: Wound; Abscess  Result Value Ref Range Status   Specimen Description   Final    ABSCESS FOOT LEFT Performed at [facility], [address] [place], [place] [address]    Special Requests   Final    NONE Performed at [facility], [place] [address], [place], [place] [address]    Gram Stain   Final    NO SQUAMOUS EPITHELIAL CELLS SEEN FEW WBC SEEN FEW GRAM POSITIVE RODS FEW GRAM NEGATIVE RODS FEW GRAM POSITIVE COCCI Performed at [facility], [place] [address], [place], [place] [address]    Culture   Final    MODERATE PROTEUS VULGARIS FEW GROUP B STREP(S.AGALACTIAE)ISOLATED TESTING AGAINST S. AGALACTIAE NOT ROUTINELY PERFORMED DUE TO PREDICTABILITY OF AMP/PEN/VAN SUSCEPTIBILITY. NO ANAEROBES ISOLATED; CULTURE IN PROGRESS FOR 5 DAYS    Report Status 04/03/2022 FINAL  Final   Organism ID, Bacteria PROTEUS VULGARIS  Final       Susceptibility   Proteus vulgaris - MIC*    AMPICILLIN >=32 RESISTANT Resistant     CEFAZOLIN >=64 RESISTANT Resistant     CEFEPIME 4 INTERMEDIATE Intermediate     CEFTAZIDIME <=1 SENSITIVE Sensitive     CIPROFLOXACIN <=0.25 SENSITIVE Sensitive     GENTAMICIN <=1 SENSITIVE Sensitive     IMIPENEM 2 SENSITIVE Sensitive     TRIMETH/SULFA <=20 SENSITIVE Sensitive     AMPICILLIN/SULBACTAM 16 INTERMEDIATE Intermediate     PIP/TAZO <=4 SENSITIVE Sensitive     * MODERATE PROTEUS VULGARIS  Culture, blood (Routine X 2) w Reflex to ID Panel     Status: None   Collection Time: 03/27/22  8:47 AM   Specimen: BLOOD LEFT WRIST  Result Value Ref Range Status   Specimen Description   Final    BLOOD LEFT WRIST Performed at [facility] [address] [place], [place] [address]    Special Requests   Final    BOTTLES DRAWN AEROBIC AND ANAEROBIC Blood Culture results may not be optimal due to an inadequate volume of blood received in culture bottles Performed at [facility] [address], [place], [place] [address]    Culture   Final    NO GROWTH 5 DAYS Performed at [facility], [place] [address], [place], [place] [address]    Report Status 04/01/2022 FINAL  Final  Culture, blood (Routine X 2) w Reflex to ID Panel     Status: None   Collection Time: 03/27/22  8:47 AM   Specimen: BLOOD  Result Value Ref Range Status   Specimen Description BLOOD BLOOD LEFT WRIST  Final   Special Requests   Final    BOTTLES DRAWN AEROBIC AND ANAEROBIC Blood Culture adequate volume   Culture   Final    NO GROWTH 5 DAYS Performed at [facility], [place] [address] [place], [place] [address]    Report Status 04/01/2022 FINAL  Final    Labs: CBC: Recent Labs  Lab 06/04/22 1517 06/05/22 0355 06/06/22 0338 06/07/22 0223 06/08/22 0810  WBC 5.9 5.0 5.4 3.5* 5.5  NEUTROABS 4.3  --   --  2.3  --   HGB 10.4* 9.9* 9.7* 9.7* 10.3*  HCT 32.1* 30.1* 29.3* 29.6*  31.4*  MCV 86.5 85.8 85.9 85.3 86.3  PLT 145* 128* 140* 117* 916   Basic Metabolic Panel: Recent Labs  Lab 06/04/22 1517 06/05/22 0355 06/06/22 0338 06/07/22 0223 06/08/22 0810  NA 136 131* 133* 136 130*  K 4.0 4.3 3.8 4.2 4.0  CL 103 100 97* 104 101  CO2 '23 22 22 24 '$ 19*  GLUCOSE 119* 157* 168* 176* 136*  BUN '17 18 17 22 '$ 32*  CREATININE 1.17 1.24 1.46* 1.44* 1.58*  CALCIUM 9.2 8.7* 8.9 8.8* 8.8*  MG 1.6* 1.9  --   --   --    Liver Function Tests: No results for input(s): "AST", "ALT", "ALKPHOS", "BILITOT", "PROT", "ALBUMIN" in the last 168 hours. CBG: Recent Labs  Lab 06/07/22 1639 06/07/22 2345 06/08/22 0135 06/08/22 0814 06/08/22 1206  GLUCAP 162* 191* 181* 125* 135*    Discharge time spent: greater than 30 minutes.  Signed: Elmarie Shiley, MD [facility] 06/08/2022

## 2022-06-08 NOTE — TOC Transition Note (Signed)
Transition of Care (TOC) - CM/SW Discharge Note Marvetta Gibbons RN, BSN Transitions of Care Unit 4E- RN Case Manager See Treatment Team for direct phone #  Weekend Coverage  Patient Details  Name: Robert Burnett MRN: [id number] Date of Birth: [birth year]  [facility] CM/SW Contact:  Dawayne Patricia, RN Phone Number: 06/08/2022, 2:11 PM   Clinical Narrative:    Pt stable for transition home today, DME has been arranged. Ortho tech to bring crutches to room, [employer] has been contacted for tub bench need- and will reach out to pt regarding cost then plan to deliver to room prior to discharge.   Pt to return home once DME delivered.    Final next level of care: Home/Self Care Barriers to Discharge: No Barriers Identified   Patient Goals and CMS Choice    In house provider    Discharge Placement               Home        Discharge Plan and Services   Discharge Planning Services: CM Consult Post Acute Care Choice: Durable Medical Equipment          DME Arranged: Tub bench, Crutches DME Agency: [employer] Date DME Agency Contacted: 06/08/22 Time DME Agency Contacted: 35 Representative spoke with at DME Agency: [place]: NA [place] Agency: NA        Social Determinants of Health ([place]) Interventions     Readmission Risk Interventions    06/08/2022    2:11 PM  Readmission Risk Prevention Plan  Transportation Screening Complete  Medication Review Press photographer) Complete  HRI or [place] Complete  SW Recovery Care/Counseling Consult Complete  [place] Not Applicable

## 2022-06-09 ENCOUNTER — Other Ambulatory Visit: Payer: Self-pay | Admitting: Family Medicine

## 2022-06-09 DIAGNOSIS — G8929 Other chronic pain: Secondary | ICD-10-CM

## 2022-06-09 NOTE — Telephone Encounter (Signed)
Patient Advocate Encounter  Received notification that the request for prior authorization for fentaNYL 75MCG/HR 72 hr patches has been denied due to no opoid therapy management agreement signed by the individual.      Lyndel Safe, CPhT Pharmacy Patient Advocate Specialist [place] Patient Advocate Team Direct Number: [phone]  Fax: [phone]

## 2022-06-12 ENCOUNTER — Other Ambulatory Visit (HOSPITAL_COMMUNITY): Payer: Self-pay

## 2022-06-12 ENCOUNTER — Ambulatory Visit (INDEPENDENT_AMBULATORY_CARE_PROVIDER_SITE_OTHER): Payer: Commercial Managed Care - HMO | Admitting: Podiatry

## 2022-06-12 DIAGNOSIS — L97522 Non-pressure chronic ulcer of other part of left foot with fat layer exposed: Secondary | ICD-10-CM

## 2022-06-12 DIAGNOSIS — E08621 Diabetes mellitus due to underlying condition with foot ulcer: Secondary | ICD-10-CM

## 2022-06-15 NOTE — Progress Notes (Signed)
  Subjective:  Patient ID: Robert Burnett, male    DOB: 11-24-1960,  MRN: [id number]  Chief Complaint  Patient presents with   Diabetic Ulcer    Diabetic ulcer left foot      61 y.o. male returns for follow-up he has a new ulceration on his amputation site.  He has been applying Medihoney.  He was hospitalized recently a week ago for NSTEMI  Review of Systems: Negative except as noted in the HPI. Denies N/V/F/Ch.   Objective:  There were no vitals filed for this visit. There is no height or weight on file to calculate BMI. Constitutional Well developed. Well nourished.  Vascular Foot warm and well perfused. Capillary refill normal to all digits.  Calf is soft and supple, no posterior calf or knee pain, negative Homans' sign  Neurologic Normal speech. Oriented to person, place, and time. Epicritic sensation to light touch grossly present bilaterally.  Dermatologic Incisions are well-healed, he is a full-thickness ulceration measuring 2.0 x 1.3 x 0.3 cm at the first cuneiform plantar  Orthopedic: He has minimal to palpation noted about the surgical site.      Radiographs reviewed no evidence of osteomyelitis, there is adjacent proximity to the plantar cuneiform  Assessment:   1. 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  Ulcer left foot -We discussed the etiology and factors that are a part of the wound healing process.  We also discussed the risk of infection both soft tissue and osteomyelitis from open ulceration.  Discussed the risk of limb loss if this happens or worsens. -Debridement as below. -Dressed with Iodosorb, DSD. -Continue home dressing changes daily with 4 x 4 gauze and Medihoney -Continue off-loading with surgical shoe. -Vascular testing pulses are palpable -Radiographs show proximity of the  cuneiform to the wound.  Likely needs exostectomy at some point.  With his recent NSTEMI I would like to avoid this at all costs   Procedure: Excisional Debridement of Wound Rationale: Removal of non-viable soft tissue from the wound to promote healing.  Anesthesia: none Post-Debridement Wound Measurements: 2.0 cm x 1.3 cm x 0.3 cm  Type of Debridement: Sharp Excisional Tissue Removed: Non-viable soft tissue Depth of Debridement: subcutaneous tissue. Technique: Sharp excisional debridement to bleeding, viable wound base.  Dressing: Dry, sterile, compression dressing. Disposition: Patient tolerated procedure well.       Return in about 3 weeks (around 07/03/2022) for wound care.

## 2022-06-26 ENCOUNTER — Ambulatory Visit: Payer: Commercial Managed Care - HMO | Attending: Cardiology | Admitting: Cardiology

## 2022-06-26 ENCOUNTER — Encounter: Payer: Self-pay | Admitting: Cardiology

## 2022-06-26 ENCOUNTER — Other Ambulatory Visit
Admission: RE | Admit: 2022-06-26 | Discharge: 2022-06-26 | Disposition: A | Discharge status: Home / Self Care | Payer: Commercial Managed Care - HMO | Source: Ambulatory Visit | Attending: Cardiology | Admitting: Cardiology

## 2022-06-26 VITALS — BP 136/84 | HR 82 | Ht 73.0 in | Wt 179.8 lb

## 2022-06-26 DIAGNOSIS — I5020 Unspecified systolic (congestive) heart failure: Secondary | ICD-10-CM

## 2022-06-26 DIAGNOSIS — I480 Paroxysmal atrial fibrillation: Secondary | ICD-10-CM | POA: Diagnosis not present

## 2022-06-26 DIAGNOSIS — E7800 Pure hypercholesterolemia, unspecified: Secondary | ICD-10-CM

## 2022-06-26 DIAGNOSIS — I2510 Atherosclerotic heart disease of native coronary artery without angina pectoris: Secondary | ICD-10-CM | POA: Diagnosis present

## 2022-06-26 DIAGNOSIS — F17200 Nicotine dependence, unspecified, uncomplicated: Secondary | ICD-10-CM

## 2022-06-26 LAB — BASIC METABOLIC PANEL
Anion gap: 10 (ref 5–15)
BUN: 25 mg/dL — ABNORMAL HIGH (ref 8–23)
CO2: 25 mmol/L (ref 22–32)
Calcium: 8.9 mg/dL (ref 8.9–10.3)
Chloride: 101 mmol/L (ref 98–111)
Creatinine, Ser: 1.06 mg/dL (ref 0.61–1.24)
GFR, Estimated: >60 mL/min (ref >60)
Glucose, Bld: 158 mg/dL — ABNORMAL HIGH (ref 70–99)
Potassium: 4.2 mmol/L (ref 3.5–5.1)
Sodium: 136 mmol/L (ref 135–145)

## 2022-06-26 MED ORDER — DAPAGLIFLOZIN PROPANEDIOL 10 MG PO TABS: 10.0000 mg | ORAL_TABLET | Freq: Every day | ORAL | 3 refills | Status: DC

## 2022-06-26 NOTE — Progress Notes (Signed)
Cardiology Office Note:    Date:  06/26/2022   ID:  Robert Burnett, DOB [birth year], MRN [id number]  PCP:  Robert Knapp, MD   [place] Providers Cardiologist:  Kate Sable, MD     Referring MD: Robert Knapp, MD   No chief complaint on file.   History of Present Illness:    Robert Burnett is a 61 y.o. 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  Patient admitted last [facility] at [facility] with chest pain, diagnosed with NSTEMI, left heart cath showed significant proximal LAD disease, received a drug-eluting stent.  Echo 05/2022 EF 40 to 45%  Due to paroxysmal A-fib, triple therapy recommended x1 month, with plans to continue Eliquis and Plavix after.  Started on Toprol-XL, upon discharge.  ARB and ARNI held due to renal dysfunction.  He takes all his medications as prescribed, not taking Jardiance due to cost issues.  Denies edema, describes chronic chest wall pain and leg pain, previously managed by PCP who recently switched jobs.  Has appointment with new primary care physician next month.  Past Medical History:  Diagnosis Date   Allergy    Anemia    Arthritis    ARTHRITIS IN SPINE - MAKES PT HAVE CHEST PAIN- USES fENTANYL PATCH    Asthma    IN TEENS    Bacteremia    a. 03/2022 Group B Strep bacteremia in setting of diabetic L foot infxn/gangrene/osteo.   Diabetes mellitus    Diabetic peripheral neuropathy associated with type 2 diabetes mellitus ([place]) 12/12/2011   Fatty liver disease, nonalcoholic 83/41/9622   Foot osteomyelitis, left ([place])    a. 2012 s/p toe amputations on L; b. 03/2022 - admitted w/ wet gangrene and necrotizing infxn w/ osteomyelitis-->s/p transmetatarsal followed by Lisfranc amputation.   GERD  (gastroesophageal reflux disease)    H/O degenerative disc disease    History of echocardiogram    a. 03/2022 Echo: EF 55-60%, no rwma, nl RV fxn, mildly elev PASP. Mild MR.   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[facility])     Past Surgical History:  Procedure Laterality Date   ACHILLES TENDON SURGERY Left 03/28/2022   Procedure: ACHILLES LENGTHENING/KIDNER;  Surgeon: [name], DPM;  Location: [facility];  Service: Podiatry;  Laterality: Left;   AMPUTATION Left 03/25/2022   Procedure: AMPUTATION DIGITS,TRANSMETARSAL AMPUTATION, REMOVAL OF TOES AND BONE BEHIND TOES ;  Surgeon: [name], DPM;  Location: [facility];  Service: Podiatry;  Laterality: Left;   APPLICATION OF WOUND VAC Left 03/25/2022   Procedure: APPLICATION OF WOUND VAC;  Surgeon: [name], DPM;  Location: [facility];  Service: Podiatry;  Laterality: Left;   CORONARY STENT INTERVENTION N/A 06/05/2022   Procedure: CORONARY STENT INTERVENTION;  Surgeon: [place] Crome, MD;  Location: [facility];  Service: Cardiovascular;  Laterality: N/A;   CYSTOSCOPY/RETROGRADE/URETEROSCOPY Left 10/29/2020   Procedure: CYSTOSCOPY/RETROGRADE/ LEFT URETEROSCOPY WITH BIOPSY, LEFT URETERAL STENT;  Surgeon: Raynelle Bring, MD;  Location: [facility];  Service: Urology;  Laterality: Left;   IRRIGATION AND DEBRIDEMENT FOOT Left 03/28/2022   Procedure: AMPUTATION LISFRANC, SECONDARY WOUND CLOSURE;  Surgeon: [name], DPM;  Location: [facility];  Service: Podiatry;  Laterality: Left;   KNEE SURGERY Bilateral 1975 and 1989   LEFT HEART CATH AND CORONARY ANGIOGRAPHY N/A 06/05/2022   Procedure: LEFT HEART CATH AND CORONARY ANGIOGRAPHY;  Surgeon: [place] Crome, MD;  Location: [facility];  Service: Cardiovascular;  Laterality: N/A;   MULTIPLE TOOTH EXTRACTIONS     ROBOT ASSITED LAPAROSCOPIC NEPHROURETERECTOMY Left 12/13/2020   Procedure: XI ROBOT ASSITED LAPAROSCOPIC NEPHROURETERECTOMY  , POST OPERATIVE INTRAVESICAL GEMCITABINE;  Surgeon: Raynelle Bring, MD;  Location: [facility];  Service: Urology;  Laterality: Left;  ONLY NEEDS 240 MIN   TOEM AMPUTATION  Left    2012, second toe   TRANSURETHRAL RESECTION OF BLADDER TUMOR N/A 08/29/2021   Procedure: TRANSURETHRAL RESECTION OF BLADDER TUMOR (TURBT) WITH CYSTOSCOPY;  Surgeon: Raynelle Bring, MD;  Location: [facility];  Service: Urology;  Laterality: N/A;  GENERAL ANESTHESIA WITH PARALYSIS   WISDOM TOOTH EXTRACTION      Current Medications: Current Meds  Medication Sig   apixaban (ELIQUIS) 5 MG TABS tablet Take 1 tablet (5 mg total) by mouth 2 (two) times daily.   atorvastatin (LIPITOR) 80 MG tablet Take 80 mg by mouth daily.   clopidogrel (PLAVIX) 75 MG tablet Take 1 tablet (75 mg total) by mouth daily.   dapagliflozin propanediol (FARXIGA) 10 MG TABS tablet Take 1 tablet (10 mg total) by mouth daily before breakfast.   ezetimibe (ZETIA) 10 MG tablet Take 1 tablet (10 mg total) by mouth daily.   fentaNYL (DURAGESIC) 75 MCG/HR Place 1 patch onto the skin every 3 (three) days.   finasteride (PROSCAR) 5 MG tablet Take 5 mg by mouth daily.   FREESTYLE LITE test strip 1 each by Other route 3 (three) times daily.   furosemide (LASIX) 40 MG tablet Take 1 tablet (40 mg total) by mouth daily as needed for edema or fluid (Take 1 tablet if your weight increases more than 2 pounds in 24 hours.).   insulin NPH-regular Human (70-30) 100 UNIT/ML injection Inject 10 Units into the skin 2 (two) times daily with a meal. RELION BRAND PLEASE   Insulin Pen Needle (PEN NEEDLES 3/16") 31G X 5 MM MISC 28 Units by Does not apply route 2 (two) times daily.   Insulin Syringe-Needle U-100 (RELION INSULIN SYRINGE 1ML/31G) 31G X 5/16" 1 ML MISC 1 each by Does not apply route in the morning and at bedtime. Use to inject Insulin 70/30   Lancets (FREESTYLE) lancets 1 each by Other route 3 (three) times daily.   metoprolol succinate (TOPROL-XL) 25 MG 24 hr tablet  Take 3 tablets (75 mg total) by mouth daily.   nitroGLYCERIN (NITROSTAT) 0.4 MG SL tablet Place 1 tablet (0.4 mg total) under the tongue every 5 (five) minutes as needed for chest pain.   pantoprazole (PROTONIX) 40 MG tablet Take 1 tablet (40 mg total) by mouth daily.   polyethylene glycol (MIRALAX / GLYCOLAX) 17 g packet Take 17 g by mouth 2 (two) times daily.   potassium chloride (KLOR-CON M) 10 MEQ tablet Take 2 tablets (20 mEq total) by mouth daily as needed (Take the days  you need to  take lasix.).     Allergies:   Amlodipine and Prozac [fluoxetine hcl]   Social History   Socioeconomic History   Marital status: Single    Spouse name: Not on file   Number of children: Not on file   Years of education: Not on file   Highest education level: Not on file  Occupational History  Not on file  Tobacco Use   Smoking status: Every Day    Packs/day: 2.00    Years: 5.00    Total pack years: 10.00    Types: Pipe, Cigarettes   Smokeless tobacco: Former    Types: Chew   Tobacco comments:    Using pipe until about a week ago (05/2022)  Vaping Use   Vaping Use: Never used  Substance and Sexual Activity   Alcohol use: No   Drug use: No   Sexual activity: Not on file  Other Topics Concern   Not on file  Social History Narrative   Lives in [place] by himself.  Sedentary in the setting of chronic arthritic pain impacting his chest/back along w/ L foot amputation.   Social Determinants of Health   Financial Resource Strain: Not on file  Food Insecurity: Not on file  Transportation Needs: Not on file  Physical Activity: Not on file  Stress: Not on file  Social Connections: Not on file     Family History: The patient's family history includes Cancer in his mother and sister; Diabetes in his father and sister; Hypertension in his father; Lung cancer in his father.  ROS:   Please see the history of present illness.     All other systems reviewed and are negative.  EKGs/Labs/Other  Studies Reviewed:    The following studies were reviewed today:   EKG:  EKG is  ordered today.  The ekg ordered today demonstrates normal sinus rhythm  Recent Labs: 03/25/2022: ALT 22 06/05/2022: Magnesium 1.9 06/07/2022: TSH 1.744 06/08/2022: BUN 32; Creatinine, Ser 1.58; Hemoglobin 10.3; Platelets 154; Potassium 4.0; Sodium 130  Recent Lipid Panel    Component Value Date/Time   CHOL 188 06/05/2022 0355   TRIG 118 06/05/2022 0355   HDL 38 (L) 06/05/2022 0355   CHOLHDL 4.9 06/05/2022 0355   VLDL 24 06/05/2022 0355   LDLCALC 126 (H) 06/05/2022 0355     Risk Assessment/Calculations:              Physical Exam:    VS:  BP 136/84   Pulse 82   Ht '6\' 1"'$  (1.854 m)   Wt 179 lb 12.8 oz (81.6 kg)   SpO2 100%   BMI 23.72 kg/m     Wt Readings from Last 3 Encounters:  06/26/22 179 lb 12.8 oz (81.6 kg)  06/08/22 178 lb 14.4 oz (81.1 kg)  04/30/22 176 lb 9.6 oz (80.1 kg)     GEN:  Well nourished, well developed in no acute distress HEENT: Normal NECK: No JVD; No carotid bruits CARDIAC: RRR, no murmurs, rubs, gallops RESPIRATORY: Diminished breath sounds, no wheezing ABDOMEN: Soft, non-tender, non-distended MUSCULOSKELETAL:  No edema; metatarsal amputation SKIN: Warm and dry NEUROLOGIC:  Alert and oriented x 3 PSYCHIATRIC:  Normal affect   ASSESSMENT:    1. Coronary artery disease involving native coronary artery of native heart without angina pectoris   2. HFrEF (heart failure with reduced ejection fraction) ([place])   3. Pure hypercholesterolemia   4. Paroxysmal atrial fibrillation (HCC)   5. Smoking    PLAN:    In order of problems listed above:  CAD s/p PCI to proximal LAD.  Continue Eliquis, Plavix.  Zetia, Lipitor.  Last echo with mildly reduced EF. HFrEF, appears euvolemic, EF 40 to 45%.  Obtain BMP, continue Toprol-XL 75 mg daily.  If creatinine normal, start Entresto, if creatinine abnormal, plan to start BiDil.  Okay to take Lasix as needed Hyperlipidemia,  continue  Lipitor 80, Zetia. Paroxysmal atrial fibrillation, in sinus rhythm.  Asymptomatic.  Continue Toprol-XL, Eliquis. Current smoker, smokes cigars, cessation advised.  Follow-up in 3 months    Cardiac Rehabilitation Eligibility Assessment  The patient is ready to start cardiac rehabilitation from a cardiac standpoint.         Medication Adjustments/Labs and Tests Ordered: Current medicines are reviewed at length with the patient today.  Concerns regarding medicines are outlined above.  Orders Placed This Encounter  Procedures   Basic metabolic panel   EKG 24-PYKD   Meds ordered this encounter  Medications   dapagliflozin propanediol (FARXIGA) 10 MG TABS tablet    Sig: Take 1 tablet (10 mg total) by mouth daily before breakfast.    Dispense:  30 tablet    Refill:  3    Patient Instructions  Medication Instructions:   Your physician has recommended you make the following change in your medication:   STOP taking your Jardiance.  2.   START taking Farxiga 10 MG once a day.   *If you need a refill on your cardiac medications before your next appointment, please call your pharmacy*   Lab Work:  _XX__  Please go to the [facility] after your appointment today for a BMP lab draw.   Follow-Up: At [facility], you and your health needs are our priority.  As part of our continuing mission to provide you with exceptional heart care, we have created designated Provider Care Teams.  These Care Teams include your primary Cardiologist (physician) and Advanced Practice Providers (APPs -  Physician Assistants and Nurse Practitioners) who all work together to provide you with the care you need, when you need it.  We recommend signing up for the patient portal called "MyChart".  Sign up information is provided on this After Visit Summary.  MyChart is used to connect with patients for Virtual Visits (Telemedicine).  Patients are able to view lab/test results, encounter  notes, upcoming appointments, etc.  Non-urgent messages can be sent to your provider as well.   To learn more about what you can do with MyChart, go to [email or web address].    Your next appointment:   3 month(s)  The format for your next appointment:   In Person  Provider:   You may see Kate Sable, MD or one of the following Advanced Practice Providers on your designated Care Team:   Murray Hodgkins, NP Christell Faith, PA-C Cadence Kathlen Mody, PA-C Gerrie Nordmann, NP      Important Information About Sugar         Signed, Kate Sable, MD  06/26/2022 12:33 PM    [place]

## 2022-06-26 NOTE — Patient Instructions (Signed)
Medication Instructions:   Your physician has recommended you make the following change in your medication:   STOP taking your Jardiance.  2.   START taking Farxiga 10 MG once a day.   *If you need a refill on your cardiac medications before your next appointment, please call your pharmacy*   Lab Work:  _XX__  Please go to the [facility] after your appointment today for a BMP lab draw.   Follow-Up: At [facility], you and your health needs are our priority.  As part of our continuing mission to provide you with exceptional heart care, we have created designated Provider Care Teams.  These Care Teams include your primary Cardiologist (physician) and Advanced Practice Providers (APPs -  Physician Assistants and Nurse Practitioners) who all work together to provide you with the care you need, when you need it.  We recommend signing up for the patient portal called "MyChart".  Sign up information is provided on this After Visit Summary.  MyChart is used to connect with patients for Virtual Visits (Telemedicine).  Patients are able to view lab/test results, encounter notes, upcoming appointments, etc.  Non-urgent messages can be sent to your provider as well.   To learn more about what you can do with MyChart, go to [email or web address].    Your next appointment:   3 month(s)  The format for your next appointment:   In Person  Provider:   You may see Kate Sable, MD or one of the following Advanced Practice Providers on your designated Care Team:   Murray Hodgkins, NP Christell Faith, PA-C Cadence Kathlen Mody, PA-C Gerrie Nordmann, NP      Important Information About Sugar

## 2022-06-27 ENCOUNTER — Telehealth: Payer: Self-pay

## 2022-06-27 MED ORDER — ENTRESTO 49-51 MG PO TABS: 1.0000 | ORAL_TABLET | Freq: Two times a day (BID) | ORAL | 3 refills | Status: DC

## 2022-06-27 MED ORDER — LOSARTAN POTASSIUM 50 MG PO TABS: 50.0000 mg | ORAL_TABLET | Freq: Every day | ORAL | 5 refills | Status: DC

## 2022-06-27 NOTE — Telephone Encounter (Signed)
-----   Message from Kate Sable, MD sent at 06/26/2022  6:11 PM EDT ----- Creatinine is normal, start Entresto 49-51 mg twice daily.

## 2022-06-27 NOTE — Telephone Encounter (Signed)
Patient was returning call. Please advise ?

## 2022-06-27 NOTE — Telephone Encounter (Signed)
Called patient and received a message that his mailbox is full and cannot accept any messages.    Will reach out through [place].

## 2022-06-27 NOTE — Telephone Encounter (Signed)
Spoke with patient and he informed me that his cost for the Entresto would be $300/mo. I called the pharmacy and confirmed that even with the coupon card, his cost would be $300/mo.  Spoke with Dr. Garen Lah and he recommended that we start the patient on Losartan 50 MG once a day.  Called the patient and informed him of this. He was very grateful for the follow up.

## 2022-07-03 ENCOUNTER — Encounter: Payer: Commercial Managed Care - HMO | Admitting: Podiatry

## 2022-07-21 ENCOUNTER — Other Ambulatory Visit: Payer: Self-pay

## 2022-07-21 ENCOUNTER — Inpatient Hospital Stay (HOSPITAL_COMMUNITY)
Admission: EM | Admit: 2022-07-21 | Discharge: 2022-07-25 | DRG: 616 | Disposition: A | Discharge status: Rehabilitation Facility | Payer: Commercial Managed Care - HMO | Attending: Internal Medicine | Admitting: Internal Medicine

## 2022-07-21 ENCOUNTER — Encounter (HOSPITAL_COMMUNITY): Payer: Self-pay | Admitting: Emergency Medicine

## 2022-07-21 ENCOUNTER — Emergency Department (HOSPITAL_COMMUNITY): Payer: Commercial Managed Care - HMO

## 2022-07-21 DIAGNOSIS — L03116 Cellulitis of left lower limb: Secondary | ICD-10-CM

## 2022-07-21 DIAGNOSIS — E861 Hypovolemia: Secondary | ICD-10-CM | POA: Diagnosis present

## 2022-07-21 DIAGNOSIS — E1169 Type 2 diabetes mellitus with other specified complication: Principal | ICD-10-CM | POA: Diagnosis present

## 2022-07-21 DIAGNOSIS — N182 Chronic kidney disease, stage 2 (mild): Secondary | ICD-10-CM

## 2022-07-21 DIAGNOSIS — K760 Fatty (change of) liver, not elsewhere classified: Secondary | ICD-10-CM | POA: Diagnosis present

## 2022-07-21 DIAGNOSIS — L0291 Cutaneous abscess, unspecified: Secondary | ICD-10-CM | POA: Diagnosis not present

## 2022-07-21 DIAGNOSIS — Z905 Acquired absence of kidney: Secondary | ICD-10-CM

## 2022-07-21 DIAGNOSIS — E43 Unspecified severe protein-calorie malnutrition: Secondary | ICD-10-CM | POA: Diagnosis present

## 2022-07-21 DIAGNOSIS — E118 Type 2 diabetes mellitus with unspecified complications: Secondary | ICD-10-CM

## 2022-07-21 DIAGNOSIS — E1122 Type 2 diabetes mellitus with diabetic chronic kidney disease: Secondary | ICD-10-CM | POA: Diagnosis present

## 2022-07-21 DIAGNOSIS — L97529 Non-pressure chronic ulcer of other part of left foot with unspecified severity: Secondary | ICD-10-CM | POA: Diagnosis present

## 2022-07-21 DIAGNOSIS — M86172 Other acute osteomyelitis, left ankle and foot: Secondary | ICD-10-CM | POA: Diagnosis present

## 2022-07-21 DIAGNOSIS — Z720 Tobacco use: Secondary | ICD-10-CM

## 2022-07-21 DIAGNOSIS — E871 Hypo-osmolality and hyponatremia: Secondary | ICD-10-CM

## 2022-07-21 DIAGNOSIS — Z8554 Personal history of malignant neoplasm of ureter: Secondary | ICD-10-CM

## 2022-07-21 DIAGNOSIS — I480 Paroxysmal atrial fibrillation: Secondary | ICD-10-CM

## 2022-07-21 DIAGNOSIS — I70245 Atherosclerosis of native arteries of left leg with ulceration of other part of foot: Secondary | ICD-10-CM | POA: Diagnosis present

## 2022-07-21 DIAGNOSIS — Z7901 Long term (current) use of anticoagulants: Secondary | ICD-10-CM

## 2022-07-21 DIAGNOSIS — Z8249 Family history of ischemic heart disease and other diseases of the circulatory system: Secondary | ICD-10-CM

## 2022-07-21 DIAGNOSIS — Z79899 Other long term (current) drug therapy: Secondary | ICD-10-CM

## 2022-07-21 DIAGNOSIS — I10 Essential (primary) hypertension: Secondary | ICD-10-CM | POA: Diagnosis present

## 2022-07-21 DIAGNOSIS — M869 Osteomyelitis, unspecified: Secondary | ICD-10-CM | POA: Diagnosis present

## 2022-07-21 DIAGNOSIS — E1152 Type 2 diabetes mellitus with diabetic peripheral angiopathy with gangrene: Secondary | ICD-10-CM | POA: Diagnosis present

## 2022-07-21 DIAGNOSIS — I2510 Atherosclerotic heart disease of native coronary artery without angina pectoris: Secondary | ICD-10-CM

## 2022-07-21 DIAGNOSIS — E1140 Type 2 diabetes mellitus with diabetic neuropathy, unspecified: Secondary | ICD-10-CM | POA: Diagnosis present

## 2022-07-21 DIAGNOSIS — Z6823 Body mass index (BMI) 23.0-23.9, adult: Secondary | ICD-10-CM

## 2022-07-21 DIAGNOSIS — M1990 Unspecified osteoarthritis, unspecified site: Secondary | ICD-10-CM | POA: Diagnosis present

## 2022-07-21 DIAGNOSIS — L02416 Cutaneous abscess of left lower limb: Secondary | ICD-10-CM

## 2022-07-21 DIAGNOSIS — F17290 Nicotine dependence, other tobacco product, uncomplicated: Secondary | ICD-10-CM | POA: Diagnosis present

## 2022-07-21 DIAGNOSIS — E785 Hyperlipidemia, unspecified: Secondary | ICD-10-CM | POA: Diagnosis present

## 2022-07-21 DIAGNOSIS — Z955 Presence of coronary angioplasty implant and graft: Secondary | ICD-10-CM

## 2022-07-21 DIAGNOSIS — Z7902 Long term (current) use of antithrombotics/antiplatelets: Secondary | ICD-10-CM

## 2022-07-21 DIAGNOSIS — Z801 Family history of malignant neoplasm of trachea, bronchus and lung: Secondary | ICD-10-CM

## 2022-07-21 DIAGNOSIS — E1142 Type 2 diabetes mellitus with diabetic polyneuropathy: Secondary | ICD-10-CM | POA: Diagnosis present

## 2022-07-21 DIAGNOSIS — D649 Anemia, unspecified: Secondary | ICD-10-CM

## 2022-07-21 DIAGNOSIS — Z888 Allergy status to other drugs, medicaments and biological substances status: Secondary | ICD-10-CM

## 2022-07-21 DIAGNOSIS — L02612 Cutaneous abscess of left foot: Secondary | ICD-10-CM | POA: Diagnosis present

## 2022-07-21 DIAGNOSIS — Z794 Long term (current) use of insulin: Secondary | ICD-10-CM

## 2022-07-21 DIAGNOSIS — D631 Anemia in chronic kidney disease: Secondary | ICD-10-CM | POA: Diagnosis present

## 2022-07-21 DIAGNOSIS — I255 Ischemic cardiomyopathy: Secondary | ICD-10-CM

## 2022-07-21 DIAGNOSIS — E11621 Type 2 diabetes mellitus with foot ulcer: Secondary | ICD-10-CM | POA: Diagnosis present

## 2022-07-21 DIAGNOSIS — K219 Gastro-esophageal reflux disease without esophagitis: Secondary | ICD-10-CM | POA: Diagnosis present

## 2022-07-21 DIAGNOSIS — I129 Hypertensive chronic kidney disease with stage 1 through stage 4 chronic kidney disease, or unspecified chronic kidney disease: Secondary | ICD-10-CM | POA: Diagnosis present

## 2022-07-21 DIAGNOSIS — I252 Old myocardial infarction: Secondary | ICD-10-CM

## 2022-07-21 DIAGNOSIS — Z833 Family history of diabetes mellitus: Secondary | ICD-10-CM

## 2022-07-21 DIAGNOSIS — S88112A Complete traumatic amputation at level between knee and ankle, left lower leg, initial encounter: Secondary | ICD-10-CM

## 2022-07-21 LAB — CBC WITH DIFFERENTIAL/PLATELET
Abs Immature Granulocytes: 0.09 10*3/uL — ABNORMAL HIGH (ref 0.00–0.07)
Basophils Absolute: 0 10*3/uL (ref 0.0–0.1)
Basophils Relative: 0 %
Eosinophils Absolute: 0 10*3/uL (ref 0.0–0.5)
Eosinophils Relative: 0 %
HCT: 28 % — ABNORMAL LOW (ref 39.0–52.0)
Hemoglobin: 8.8 g/dL — ABNORMAL LOW (ref 13.0–17.0)
Immature Granulocytes: 1 %
Lymphocytes Relative: 5 %
Lymphs Abs: 0.6 10*3/uL — ABNORMAL LOW (ref 0.7–4.0)
MCH: 26 pg (ref 26.0–34.0)
MCHC: 31.4 g/dL (ref 30.0–36.0)
MCV: 82.6 fL (ref 80.0–100.0)
Monocytes Absolute: 0.7 10*3/uL (ref 0.1–1.0)
Monocytes Relative: 5 %
Neutro Abs: 12.5 10*3/uL — ABNORMAL HIGH (ref 1.7–7.7)
Neutrophils Relative %: 89 %
Platelets: 247 10*3/uL (ref 150–400)
RBC: 3.39 MIL/uL — ABNORMAL LOW (ref 4.22–5.81)
RDW: 14.6 % (ref 11.5–15.5)
WBC: 13.9 10*3/uL — ABNORMAL HIGH (ref 4.0–10.5)
nRBC: 0 % (ref 0.0–0.2)

## 2022-07-21 LAB — COMPREHENSIVE METABOLIC PANEL
ALT: 13 U/L (ref 0–44)
AST: 16 U/L (ref 15–41)
Albumin: 2.3 g/dL — ABNORMAL LOW (ref 3.5–5.0)
Alkaline Phosphatase: 93 U/L (ref 38–126)
Anion gap: 12 (ref 5–15)
BUN: 15 mg/dL (ref 8–23)
CO2: 22 mmol/L (ref 22–32)
Calcium: 8.2 mg/dL — ABNORMAL LOW (ref 8.9–10.3)
Chloride: 95 mmol/L — ABNORMAL LOW (ref 98–111)
Creatinine, Ser: 1.25 mg/dL — ABNORMAL HIGH (ref 0.61–1.24)
GFR, Estimated: >60 mL/min (ref >60)
Glucose, Bld: 171 mg/dL — ABNORMAL HIGH (ref 70–99)
Potassium: 3.8 mmol/L (ref 3.5–5.1)
Sodium: 129 mmol/L — ABNORMAL LOW (ref 135–145)
Total Bilirubin: 0.9 mg/dL (ref 0.3–1.2)
Total Protein: 8 g/dL (ref 6.5–8.1)

## 2022-07-21 LAB — LACTIC ACID, PLASMA: Lactic Acid, Venous: 1.4 mmol/L (ref 0.5–1.9)

## 2022-07-21 MED ORDER — HYDROCODONE-ACETAMINOPHEN 5-325 MG PO TABS: 1.0000 | ORAL_TABLET | Freq: Once | ORAL | Status: DC

## 2022-07-21 NOTE — ED Provider Triage Note (Signed)
Emergency Medicine Provider Triage Evaluation Note  [name] , a 61 y.o. male  was evaluated in triage.  Pt complains of left foot ulcer, redness, pain and swelling.  Patient has a history of osteomyelitis in affected foot of which had partial amputation of foot.  He states that over the past week, he is taken less care of foot changing out bandages less frequently.  Over the past couple days he has noticed increasing pain, drainage from site as well as skin redness.  Denies fever, chills, night sweats..  Review of Systems  Positive: See above Negative:   Physical Exam  BP 118/63 (BP Location: Left Arm)   Pulse 88   Temp 98.4 F (36.9 C) (Oral)   Resp 20   SpO2 99%  Gen:   Awake, no distress   Resp:  Normal effort  MSK:   Moves extremities without difficulty  Other:  Large plantar ulcer noted with surrounding swelling, erythema.  Area is warm to the touch.  Erythema and swelling extends up ankle.  Medical Decision Making  Medically screening exam initiated at 8:51 PM.  Appropriate orders placed.  LATAVIUS CAPIZZI was informed that the remainder of the evaluation will be completed by another provider, this initial triage assessment does not replace that evaluation, and the importance of remaining in the ED until their evaluation is complete.     Wilnette Kales, [place] 07/21/22 2052

## 2022-07-21 NOTE — ED Triage Notes (Signed)
Pt reports pressure ulcer to left foot after being admitted to hospital; pt reports pain to pressure wound with redness, swelling and foul smell

## 2022-07-22 ENCOUNTER — Emergency Department (HOSPITAL_COMMUNITY): Payer: Commercial Managed Care - HMO

## 2022-07-22 DIAGNOSIS — D631 Anemia in chronic kidney disease: Secondary | ICD-10-CM | POA: Diagnosis present

## 2022-07-22 DIAGNOSIS — M25552 Pain in left hip: Secondary | ICD-10-CM | POA: Diagnosis not present

## 2022-07-22 DIAGNOSIS — D649 Anemia, unspecified: Secondary | ICD-10-CM

## 2022-07-22 DIAGNOSIS — I255 Ischemic cardiomyopathy: Secondary | ICD-10-CM | POA: Diagnosis present

## 2022-07-22 DIAGNOSIS — I70245 Atherosclerosis of native arteries of left leg with ulceration of other part of foot: Secondary | ICD-10-CM | POA: Diagnosis present

## 2022-07-22 DIAGNOSIS — T8141XA Infection following a procedure, superficial incisional surgical site, initial encounter: Secondary | ICD-10-CM | POA: Diagnosis not present

## 2022-07-22 DIAGNOSIS — L97529 Non-pressure chronic ulcer of other part of left foot with unspecified severity: Secondary | ICD-10-CM | POA: Diagnosis present

## 2022-07-22 DIAGNOSIS — M86172 Other acute osteomyelitis, left ankle and foot: Secondary | ICD-10-CM

## 2022-07-22 DIAGNOSIS — R770 Abnormality of albumin: Secondary | ICD-10-CM | POA: Diagnosis not present

## 2022-07-22 DIAGNOSIS — I480 Paroxysmal atrial fibrillation: Secondary | ICD-10-CM

## 2022-07-22 DIAGNOSIS — Z79899 Other long term (current) drug therapy: Secondary | ICD-10-CM | POA: Diagnosis not present

## 2022-07-22 DIAGNOSIS — Z720 Tobacco use: Secondary | ICD-10-CM

## 2022-07-22 DIAGNOSIS — I129 Hypertensive chronic kidney disease with stage 1 through stage 4 chronic kidney disease, or unspecified chronic kidney disease: Secondary | ICD-10-CM | POA: Diagnosis present

## 2022-07-22 DIAGNOSIS — M869 Osteomyelitis, unspecified: Secondary | ICD-10-CM | POA: Diagnosis present

## 2022-07-22 DIAGNOSIS — E1140 Type 2 diabetes mellitus with diabetic neuropathy, unspecified: Secondary | ICD-10-CM | POA: Diagnosis present

## 2022-07-22 DIAGNOSIS — E1159 Type 2 diabetes mellitus with other circulatory complications: Secondary | ICD-10-CM | POA: Diagnosis not present

## 2022-07-22 DIAGNOSIS — G546 Phantom limb syndrome with pain: Secondary | ICD-10-CM | POA: Diagnosis not present

## 2022-07-22 DIAGNOSIS — E861 Hypovolemia: Secondary | ICD-10-CM | POA: Diagnosis present

## 2022-07-22 DIAGNOSIS — K5900 Constipation, unspecified: Secondary | ICD-10-CM | POA: Diagnosis not present

## 2022-07-22 DIAGNOSIS — I2510 Atherosclerotic heart disease of native coronary artery without angina pectoris: Secondary | ICD-10-CM

## 2022-07-22 DIAGNOSIS — Z794 Long term (current) use of insulin: Secondary | ICD-10-CM | POA: Diagnosis not present

## 2022-07-22 DIAGNOSIS — E11621 Type 2 diabetes mellitus with foot ulcer: Secondary | ICD-10-CM | POA: Diagnosis present

## 2022-07-22 DIAGNOSIS — D62 Acute posthemorrhagic anemia: Secondary | ICD-10-CM | POA: Diagnosis not present

## 2022-07-22 DIAGNOSIS — L0291 Cutaneous abscess, unspecified: Secondary | ICD-10-CM | POA: Diagnosis present

## 2022-07-22 DIAGNOSIS — E871 Hypo-osmolality and hyponatremia: Secondary | ICD-10-CM | POA: Diagnosis present

## 2022-07-22 DIAGNOSIS — E43 Unspecified severe protein-calorie malnutrition: Secondary | ICD-10-CM | POA: Diagnosis present

## 2022-07-22 DIAGNOSIS — S88112A Complete traumatic amputation at level between knee and ankle, left lower leg, initial encounter: Secondary | ICD-10-CM | POA: Diagnosis not present

## 2022-07-22 DIAGNOSIS — N182 Chronic kidney disease, stage 2 (mild): Secondary | ICD-10-CM | POA: Diagnosis present

## 2022-07-22 DIAGNOSIS — E1152 Type 2 diabetes mellitus with diabetic peripheral angiopathy with gangrene: Secondary | ICD-10-CM | POA: Diagnosis present

## 2022-07-22 DIAGNOSIS — E1142 Type 2 diabetes mellitus with diabetic polyneuropathy: Secondary | ICD-10-CM | POA: Diagnosis present

## 2022-07-22 DIAGNOSIS — Z89512 Acquired absence of left leg below knee: Secondary | ICD-10-CM | POA: Diagnosis not present

## 2022-07-22 DIAGNOSIS — E1122 Type 2 diabetes mellitus with diabetic chronic kidney disease: Secondary | ICD-10-CM | POA: Diagnosis present

## 2022-07-22 DIAGNOSIS — E1169 Type 2 diabetes mellitus with other specified complication: Secondary | ICD-10-CM | POA: Diagnosis present

## 2022-07-22 DIAGNOSIS — L03116 Cellulitis of left lower limb: Secondary | ICD-10-CM | POA: Diagnosis present

## 2022-07-22 DIAGNOSIS — L02612 Cutaneous abscess of left foot: Secondary | ICD-10-CM | POA: Diagnosis present

## 2022-07-22 DIAGNOSIS — E785 Hyperlipidemia, unspecified: Secondary | ICD-10-CM | POA: Diagnosis present

## 2022-07-22 DIAGNOSIS — K760 Fatty (change of) liver, not elsewhere classified: Secondary | ICD-10-CM | POA: Diagnosis present

## 2022-07-22 DIAGNOSIS — M86272 Subacute osteomyelitis, left ankle and foot: Secondary | ICD-10-CM | POA: Diagnosis not present

## 2022-07-22 DIAGNOSIS — Z7984 Long term (current) use of oral hypoglycemic drugs: Secondary | ICD-10-CM | POA: Diagnosis not present

## 2022-07-22 DIAGNOSIS — L02416 Cutaneous abscess of left lower limb: Secondary | ICD-10-CM | POA: Diagnosis present

## 2022-07-22 LAB — BASIC METABOLIC PANEL
Anion gap: 15 (ref 5–15)
BUN: 22 mg/dL (ref 8–23)
CO2: 23 mmol/L (ref 22–32)
Calcium: 9 mg/dL (ref 8.9–10.3)
Chloride: 93 mmol/L — ABNORMAL LOW (ref 98–111)
Creatinine, Ser: 1.68 mg/dL — ABNORMAL HIGH (ref 0.61–1.24)
GFR, Estimated: 46 mL/min — ABNORMAL LOW (ref >60)
Glucose, Bld: 157 mg/dL — ABNORMAL HIGH (ref 70–99)
Potassium: 4.1 mmol/L (ref 3.5–5.1)
Sodium: 131 mmol/L — ABNORMAL LOW (ref 135–145)

## 2022-07-22 LAB — CBC
HCT: 30.5 % — ABNORMAL LOW (ref 39.0–52.0)
Hemoglobin: 9.9 g/dL — ABNORMAL LOW (ref 13.0–17.0)
MCH: 26.1 pg (ref 26.0–34.0)
MCHC: 32.5 g/dL (ref 30.0–36.0)
MCV: 80.5 fL (ref 80.0–100.0)
Platelets: 281 10*3/uL (ref 150–400)
RBC: 3.79 MIL/uL — ABNORMAL LOW (ref 4.22–5.81)
RDW: 14.7 % (ref 11.5–15.5)
WBC: 11.8 10*3/uL — ABNORMAL HIGH (ref 4.0–10.5)
nRBC: 0 % (ref 0.0–0.2)

## 2022-07-22 LAB — IRON AND TIBC
Iron: 25 ug/dL — ABNORMAL LOW (ref 45–182)
Saturation Ratios: 8 % — ABNORMAL LOW (ref 17.9–39.5)
TIBC: 301 ug/dL (ref 250–450)
UIBC: 276 ug/dL

## 2022-07-22 LAB — HEMOGLOBIN A1C
Hgb A1c MFr Bld: 6.8 % — ABNORMAL HIGH (ref 4.8–5.6)
Mean Plasma Glucose: 148.46 mg/dL

## 2022-07-22 LAB — SEDIMENTATION RATE: Sed Rate: 124 mm/hr — ABNORMAL HIGH (ref 0–16)

## 2022-07-22 LAB — FERRITIN: Ferritin: 161 ng/mL (ref 24–336)

## 2022-07-22 LAB — CBG MONITORING, ED: Glucose-Capillary: 177 mg/dL — ABNORMAL HIGH (ref 70–99)

## 2022-07-22 LAB — C-REACTIVE PROTEIN: CRP: 26.5 mg/dL — ABNORMAL HIGH (ref <1.0)

## 2022-07-22 MED ORDER — VANCOMYCIN HCL 2000 MG/400ML IV SOLN
2000.0000 mg | Freq: Once | INTRAVENOUS | Status: AC
  Administered 2022-07-22: 2000 mg via INTRAVENOUS
  Filled 2022-07-22: qty 400

## 2022-07-22 MED ORDER — PIPERACILLIN-TAZOBACTAM 3.375 G IVPB: 3.3750 g | Freq: Four times a day (QID) | INTRAVENOUS | Status: DC

## 2022-07-22 MED ORDER — LACTATED RINGERS IV SOLN: INTRAVENOUS | Status: AC

## 2022-07-22 MED ORDER — PIPERACILLIN-TAZOBACTAM 3.375 G IVPB
3.3750 g | Freq: Three times a day (TID) | INTRAVENOUS | Status: AC
  Administered 2022-07-22 – 2022-07-24 (×7): 3.375 g via INTRAVENOUS
  Filled 2022-07-22 (×8): qty 50

## 2022-07-22 MED ORDER — NICOTINE 7 MG/24HR TD PT24
7.0000 mg | MEDICATED_PATCH | Freq: Every day | TRANSDERMAL | Status: DC
  Administered 2022-07-22 – 2022-07-25 (×4): 7 mg via TRANSDERMAL
  Filled 2022-07-22 (×5): qty 1

## 2022-07-22 MED ORDER — SODIUM CHLORIDE 0.9 % IV BOLUS
1000.0000 mL | Freq: Once | INTRAVENOUS | Status: AC
  Administered 2022-07-22: 1000 mL via INTRAVENOUS

## 2022-07-22 MED ORDER — ACETAMINOPHEN 325 MG PO TABS
650.0000 mg | ORAL_TABLET | Freq: Four times a day (QID) | ORAL | Status: DC | PRN
  Administered 2022-07-23 – 2022-07-25 (×4): 650 mg via ORAL
  Filled 2022-07-22 (×4): qty 2

## 2022-07-22 MED ORDER — INSULIN ASPART 100 UNIT/ML IJ SOLN
0.0000 [IU] | Freq: Three times a day (TID) | INTRAMUSCULAR | Status: DC
  Administered 2022-07-22: 2 [IU] via SUBCUTANEOUS
  Administered 2022-07-23: 1 [IU] via SUBCUTANEOUS
  Administered 2022-07-24: 2 [IU] via SUBCUTANEOUS
  Administered 2022-07-24 (×2): 1 [IU] via SUBCUTANEOUS
  Administered 2022-07-25 (×2): 2 [IU] via SUBCUTANEOUS

## 2022-07-22 MED ORDER — ACETAMINOPHEN 650 MG RE SUPP: 650.0000 mg | Freq: Four times a day (QID) | RECTAL | Status: DC | PRN

## 2022-07-22 MED ORDER — INSULIN DETEMIR 100 UNIT/ML ~~LOC~~ SOLN
8.0000 [IU] | Freq: Every day | SUBCUTANEOUS | Status: DC
  Administered 2022-07-22 – 2022-07-24 (×3): 8 [IU] via SUBCUTANEOUS
  Filled 2022-07-22 (×5): qty 0.08

## 2022-07-22 MED ORDER — MORPHINE SULFATE (PF) 2 MG/ML IV SOLN
2.0000 mg | INTRAVENOUS | Status: DC | PRN
  Administered 2022-07-22 – 2022-07-23 (×5): 2 mg via INTRAVENOUS
  Filled 2022-07-22 (×5): qty 1

## 2022-07-22 MED ORDER — ONDANSETRON HCL 4 MG/2ML IJ SOLN: 4.0000 mg | Freq: Three times a day (TID) | INTRAMUSCULAR | Status: DC | PRN

## 2022-07-22 MED ORDER — HYDROMORPHONE HCL 1 MG/ML IJ SOLN
0.5000 mg | Freq: Once | INTRAMUSCULAR | Status: AC
  Administered 2022-07-22: 0.5 mg via INTRAVENOUS
  Filled 2022-07-22: qty 1

## 2022-07-22 MED ORDER — OXYCODONE HCL 5 MG PO TABS
5.0000 mg | ORAL_TABLET | ORAL | Status: DC | PRN
  Administered 2022-07-22: 5 mg via ORAL
  Filled 2022-07-22: qty 1

## 2022-07-22 MED ORDER — VANCOMYCIN HCL 1750 MG/350ML IV SOLN
1750.0000 mg | INTRAVENOUS | Status: AC
  Administered 2022-07-23 – 2022-07-24 (×3): 1750 mg via INTRAVENOUS
  Filled 2022-07-22 (×4): qty 350

## 2022-07-22 NOTE — ED Provider Notes (Signed)
[facility] EMERGENCY DEPARTMENT Provider Note   CSN: [id number] Arrival date & time: 07/21/22  1922     History  Chief Complaint  Patient presents with   Wound Infection    Robert Burnett is a 61 y.o. 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   Pt complains of left foot ulcer, redness, severe pain and swelling.  Patient has a history of necrotizing fasciitis and osteomyelitis in affected foot of which had partial amputation of foot.  He states that over the past week, he is taken less care of foot changing out bandages less frequently.  Over the past couple days he has noticed increasing pain, drainage from site as well as skin redness.  Denies fever, chills. Endorses purulent drainage from foot and worsening pain.   Per chart he was recently admitted to the hospital from 06/04/22-06/08/22 for an NSTEMI.  Saw podiatry on 06/12/2022 with new ulceration amputation site with image in the chart.  HPI     Home Medications Prior to Admission medications   Medication Sig Start Date End Date Taking? Authorizing Provider  antiseptic oral rinse (BIOTENE) LIQD 15 mLs by Mouth Rinse route as needed for dry mouth.   Yes [provider]  apixaban (ELIQUIS) 5 MG TABS tablet Take 1 tablet (5 mg total) by mouth 2 (two) times daily. 06/06/22  Yes Florencia Reasons, MD  aspirin EC 81 MG tablet Take 1 tablet (81 mg total) by mouth daily. Swallow whole. For 30 days then stop 06/06/22 06/06/23 Yes Florencia Reasons, MD  atorvastatin (LIPITOR) 80 MG tablet Take 80 mg by mouth daily. 04/25/21  Yes [provider]  clopidogrel (PLAVIX) 75 MG tablet Take 1 tablet (75 mg total) by mouth daily. 06/07/22  Yes Florencia Reasons, MD  dapagliflozin propanediol (FARXIGA) 10 MG TABS  tablet Take 1 tablet (10 mg total) by mouth daily before breakfast. 06/26/22  Yes Agbor-Etang, Aaron Edelman, MD  Diclofenac Sodium (VOLTAREN ARTHRITIS PAIN EX) Apply 1 application  topically daily as needed (pain relief).   Yes [provider]  ezetimibe (ZETIA) 10 MG tablet Take 1 tablet (10 mg total) by mouth daily. 06/07/22  Yes Florencia Reasons, MD  fentaNYL (DURAGESIC) 75 MCG/HR Place 1 patch onto the skin every 3 (three) days. 06/06/22  Yes Florencia Reasons, MD  finasteride (PROSCAR) 5 MG tablet Take 5 mg by mouth daily.   Yes [provider]  furosemide (LASIX) 40 MG tablet Take 1 tablet (40 mg total) by mouth daily as needed for edema or fluid (Take 1 tablet if your weight increases more than 2 pounds in 24 hours.). 06/08/22 06/08/23 Yes Regalado, Belkys A, MD  insulin NPH-regular Human (70-30) 100 UNIT/ML injection Inject 10 Units into the skin 2 (two) times daily with a meal. RELION BRAND PLEASE 06/08/22  Yes Regalado, Belkys A, MD  metoprolol succinate (TOPROL-XL) 25 MG 24 hr tablet Take 3 tablets (75 mg total) by mouth daily. Patient taking differently: Take 25 mg by mouth 3 (three) times daily. 06/07/22  Yes Florencia Reasons, MD  nitroGLYCERIN (NITROSTAT) 0.4 MG SL tablet Place 1 tablet (0.4 mg total) under the tongue every 5 (five) minutes as needed for chest pain. 06/08/22 06/08/23 Yes Regalado, Belkys A, MD  pantoprazole (PROTONIX) 40 MG tablet Take 1 tablet (40 mg total) by  mouth daily. 06/06/22  Yes Florencia Reasons, MD  polyethylene glycol (MIRALAX / GLYCOLAX) 17 g packet Take 17 g by mouth 2 (two) times daily. 06/08/22  Yes Regalado, Belkys A, MD  potassium chloride (KLOR-CON M) 10 MEQ tablet Take 2 tablets (20 mEq total) by mouth daily as needed (Take the days  you need to  take lasix.). 06/08/22  Yes Regalado, Belkys A, MD  FREESTYLE LITE test strip 1 each by Other route 3 (three) times daily. 04/30/22   Charlott Rakes, MD  Insulin Pen Needle (PEN NEEDLES 3/16") 31G X 5 MM MISC 28 Units by Does not apply route 2  (two) times daily. 04/04/22   Georgette Shell, MD  Insulin Syringe-Needle U-100 (RELION INSULIN SYRINGE 1ML/31G) 31G X 5/16" 1 ML MISC 1 each by Does not apply route in the morning and at bedtime. Use to inject Insulin 70/30 06/08/22   Regalado, Belkys A, MD  Lancets (FREESTYLE) lancets 1 each by Other route 3 (three) times daily. 04/30/22   Charlott Rakes, MD  losartan (COZAAR) 50 MG tablet Take 1 tablet (50 mg total) by mouth daily. 06/27/22 09/26/23  Kate Sable, MD      Allergies    Amlodipine and Prozac [fluoxetine hcl]    Review of Systems   Review of Systems Review of systems negative for f/c.  A 10 point review of systems was performed and is negative unless otherwise reported in HPI.  Physical Exam Updated Vital Signs BP (!) 142/124   Pulse 86   Temp 98.8 F (37.1 C) (Oral)   Resp (!) 23   Ht 6' 1" (1.854 m)   Wt 81.2 kg   SpO2 100%   BMI 23.62 kg/m  Physical Exam General: Uncomfortable appearing male, lying in bed.  HEENT: Sclera anicteric, MMM, trachea midline. Cardiology: RRR, no murmurs/rubs/gallops.  Resp: Normal respiratory rate and effort. CTAB, no wheezes, rhonchi, crackles.  Abd: Soft, non-tender, non-distended. No rebound tenderness or guarding.  GU: Deferred. MSK: Please see attached media.  3 x 3 cm area of ulceration at the amputation site of the ventral first and second metatarsal with purulent drainage.  Brawny erythema, purple in places, extending up to mid shin with pruritic rash at edge of erythema.  Very warm, very tender to palpation diffusely.  No crepitus palpable. Skin: warm, dry. No rashes or lesions. Neuro: A&Ox4, CNs II-XII grossly intact. MAEs. Sensation grossly intact.  Psych: Normal mood and affect.     Media Information       ED Results / Procedures / Treatments   Labs (all labs ordered are listed, but only abnormal results are displayed) Labs Reviewed  COMPREHENSIVE METABOLIC PANEL - Abnormal; Notable for the following  components:      Result Value   Sodium 129 (*)    Chloride 95 (*)    Glucose, Bld 171 (*)    Creatinine, Ser 1.25 (*)    Calcium 8.2 (*)    Albumin 2.3 (*)    All other components within normal limits  CBC WITH DIFFERENTIAL/PLATELET - Abnormal; Notable for the following components:   WBC 13.9 (*)    RBC 3.39 (*)    Hemoglobin 8.8 (*)    HCT 28.0 (*)    Neutro Abs 12.5 (*)    Lymphs Abs 0.6 (*)    Abs Immature Granulocytes 0.09 (*)    All other components within normal limits  SEDIMENTATION RATE - Abnormal; Notable for the following components:   Sed Rate 124 (*)  All other components within normal limits  C-REACTIVE PROTEIN - Abnormal; Notable for the following components:   CRP 26.5 (*)    All other components within normal limits  CULTURE, BLOOD (ROUTINE X 2)  CULTURE, BLOOD (ROUTINE X 2)  LACTIC ACID, PLASMA  LACTIC ACID, PLASMA    EKG None  Radiology MR FOOT LEFT WO CONTRAST  Result Date: 07/22/2022 CLINICAL DATA:  Left foot ulcer and infection.  Prior amputation. EXAM: MRI OF THE LEFT FOOT WITHOUT CONTRAST TECHNIQUE: Multiplanar, multisequence MR imaging of the left foot was performed. No intravenous contrast was administered. COMPARISON:  Left foot x-rays from yesterday. MRI left foot dated March 25, 2022. FINDINGS: Bones/Joint/Cartilage Abnormal marrow edema involving the cuneiforms, navicular, and cuboid. Periarticular marrow edema involving the tibiotalar and posterior subtalar joints. Bony destruction of the medial and middle cuneiforms as well as the navicular. No fracture or dislocation. Small tibiotalar joint effusion. Ligaments Medial and lateral ankle ligaments are intact. Muscles and Tendons High-grade partial tear of the anterior tibialis tendon. Achilles tendinosis. Soft tissue Deep soft tissue ulceration at the plantar aspect of the medial cuneiform, extending to bone. Large multiloculated fluid collection in the posterior hindfoot between the Achilles and  distal tibia, measuring 9.3 x 4.6 x 10.8 cm. The fluid collection may communicate with the posterior ankle joint (series 3, image 17 and/or the flexor hallucis longus tendon sheath. There are few foci of air within the fluid collection. The fluid collection significantly displaces the posterior tibial neurovascular bundle posteriorly and medially. Diffuse circumferential soft tissue swelling and skin thickening. IMPRESSION: 1. Deep soft tissue ulceration at the plantar aspect of the medial cuneiform, extending to bone, with underlying osteomyelitis of the cuneiforms, cuboid, and navicular. 2. Large 10.8 cm multiloculated abscess in the posterior hindfoot between the Achilles and distal tibia. The abscess may communicate with the posterior ankle joint and/or the flexor hallucis longus tendon sheath. 3. Periarticular marrow edema involving the tibiotalar and posterior subtalar joints, with small tibiotalar joint effusion, concerning for septic arthritis and osteomyelitis. Electronically Signed   By: Titus Dubin M.D.   On: 07/22/2022 08:49   DG Foot Complete Left  Result Date: 07/21/2022 CLINICAL DATA:  Foul-smelling wound EXAM: LEFT FOOT - COMPLETE 3+ VIEW COMPARISON:  06/04/2022 FINDINGS: Transmetatarsal amputation is again identified and stable. Some generalized soft tissue swelling is noted in the foot ankle new from the prior exam. Increased lucency is noted in the residual first metatarsal and first cuneiform best noted on the frontal and lateral projections. These changes are consistent with progressive osteomyelitis. Soft tissue wound is noted along the plantar aspect consistent with the given clinical history. IMPRESSION: Increased lucency in the first metatarsal remnant and first cuneiform consistent with progressive osteomyelitis. Electronically Signed   By: Inez Catalina M.D.   On: 07/21/2022 21:33    Procedures Procedures    Medications Ordered in ED Medications  HYDROcodone-acetaminophen  (NORCO/VICODIN) 5-325 MG per tablet 1 tablet (has no administration in time range)  vancomycin (VANCOREADY) IVPB 2000 mg/400 mL (2,000 mg Intravenous New Bag/Given 07/22/22 1042)  vancomycin (VANCOREADY) IVPB 1750 mg/350 mL (has no administration in time range)  piperacillin-tazobactam (ZOSYN) IVPB 3.375 g (has no administration in time range)  HYDROmorphone (DILAUDID) injection 0.5 mg (has no administration in time range)  sodium chloride 0.9 % bolus 1,000 mL (0 mLs Intravenous Stopped 07/22/22 0936)  HYDROmorphone (DILAUDID) injection 0.5 mg (0.5 mg Intravenous Given 07/22/22 4562)    ED Course/ Medical Decision Making/ A&P  Medical Decision Making Amount and/or Complexity of Data Reviewed Labs: ordered.  Risk Prescription drug management.    Patient is currently hemodynamically stable, afebrile.   Patient w/ severe pain and concerning clinical exam findings for necrotizing cellulitis vs fasciitis given purple color of foot and purpura on LLE. However, patient is hemodynamic stabile, afebrile, and course of infection has progressed over one week to several days, not hours; patient does not believe it has worsened while in the ED. Also consider osteomyelitis, cellulitis vs abscess, septic arthritis. Patient without vital sign abnormalities c/f sepsis or septic shock, but given severity of infection, will order full labs including blood cultures. Will not tap ankle joint d/t obvious overlying cellulitis. Could consider XR, CT, or MRI imaging for patient's presentation. On my evaluation of patient, he had already received LLE X-rays and LLE MRI from triage after waiting in the waiting room.  11:36 AM Called radiologist to discuss MRI performed overnight, as the read is still pending. Demonstrates no signs of necrotizing soft tissue infection but does demonstrate osteomyelitis of navicular and all 3 cuneiform bones as well as large abscess. Labs demonstrate WBC 13.9, Hgb  8.8 down from 10.3, glucose 171, lactate 1.4, sodium 129 down from 136, creatinine 1.25 up from 1.06.  CRP 26.5, ESR 124. Will give patient 1 L normal saline bolus. I have personally reviewed and interpreted all labs and imaging.   Patient is given IV vanc and zosyn for infection. Also given 1L NS bolus for infection possible sepsis as well as hyponatremia. Dilaudid for pain control.    Clinical Course as of 07/22/22 1244  Tue Jul 22, 2022  0845 Consulted to orthopedic surgery who recommended I discuss with podiatry. Consulted to podiatry. [HN]  [id number] Listed on call for [facility] is Cannon Kettle, but she called back and stated she no longer works there. Another schedule states Jacqualyn Posey is on all - called and left a voicemail.  [HN]  65 Podiatry recommending orthopedic surgery engagement for possible BKA.  Reconsulted orthopedic surgery [HN]  1218 D/w Dr. Sharol Given with [facility]. Patient will be scheduled tomorrow for L BKA. NPO at midnight and consulted to medicine for admission. [HN]  1241 Formal MRI read:  IMPRESSION: 1. Deep soft tissue ulceration at the plantar aspect of the medial cuneiform, extending to bone, with underlying osteomyelitis of the cuneiforms, cuboid, and navicular. 2. Large 10.8 cm multiloculated abscess in the posterior hindfoot between the Achilles and distal tibia. The abscess may communicate with the posterior ankle joint and/or the flexor hallucis longus tendon sheath. 3. Periarticular marrow edema involving the tibiotalar and posterior subtalar joints, with small tibiotalar joint effusion, concerning for septic arthritis and osteomyelitis. [HN]  0175 Patient admitted to medicine.  [HN]    Clinical Course User Index [HN] Audley Hose, MD          Final Clinical Impression(s) / ED Diagnoses Final diagnoses:  Diabetic osteomyelitis ([place])  Abscess  Cellulitis of left foot  Hyponatremia  Anemia, unspecified type    Rx / DC Orders ED Discharge Orders      None        This note was created using dictation software, which may contain spelling or grammatical errors.    Audley Hose, MD 07/22/22 1247

## 2022-07-22 NOTE — Assessment & Plan Note (Addendum)
Euvolemic to dry Echo 05/2022: EF of 40-45%  Strict I/O Continue toprol and farxiga Cardiology considering entresto if renal function allows

## 2022-07-22 NOTE — Progress Notes (Signed)
Consult received. I will see the patient later today. I have reviewed the MRI. Based on the finding he is likely going to need a proximal amputation. I have recommended orthopedics consult, Dr. Sharol Given.

## 2022-07-22 NOTE — H&P (Signed)
History and Physical    Patient: Robert KLINGBERG OIZ:[id number] DOB: 02-24-1961 DOA: 07/21/2022 DOS: the patient was seen and examined on 07/22/2022 PCP: Charlott Rakes, MD  Patient coming from: Home - lives with nephrew   Chief Complaint: left foot pain/drainage   HPI: Robert Burnett is a 61 y.o. male with medical history significant of  HTN, T2DM, fatty liver, HLD, hx of urothelial cancer, diabetic neuropathy who presented to ED with complaints of worsening left foot pain, foul drainage and swelling x 1 week. He has known diabetic ulcer of at base of metatarsal, present since at least July 2023. He states his left knee started to hurt from his arthritis and he wasn't able to really clean or dress his ulcer like he should due to inability to bend his leg. As the pain and foul smelling drainage increased he came to ED. He denies any fever/chills, N/V/D. He has had poor appetite over the past few days.   He was admitted in 03/2022 for diabetic foot infection with wet gangrene and necrotizing infection and osteomyelitis. S/p transmetatarsal amputation of the left foot with I&D by podiatry on 03/25/22 then OR again on 6/9 with Lisfranc amputation. ABI normal.   Admitted again on 8/16 for NSTEMI s/p LHC with with stent to LAD on 8/17. Also had new PAF. triple therapy with ASA, plavix and eliquis x 1 months then plavix/eliquis. 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   He smokes a pipe and does not drink alcohol.   ER Course:  vitals: afebrile, bp: 118/63, HR: 88, RR: 20, oxygen: 99%RA Pertinent labs: wbc: 13.9, hgb: 8.8, sodium: 129, glucose: 171, creatinine: 1.25, ESR: 124, CRP: 26.5. lactic acid wnl. BC obtained Left foot xray: Increased lucency in the first metatarsal remnant and first cuneiform consistent with progressive osteomyelitis. Left foot MRI: 1. Deep soft tissue ulceration at the plantar aspect of the medial cuneiform, extending to bone, with underlying osteomyelitis of the cuneiforms,  cuboid, and navicular. 2. Large 10.8 cm multiloculated abscess in the posterior hindfoot between the Achilles and distal tibia. The abscess may communicate with the posterior ankle joint and/or the flexor hallucis longus tendon sheath. 3. Periarticular marrow edema involving the tibiotalar and posterior subtalar joints, with small tibiotalar joint effusion, concerning for septic arthritis and osteomyelitis. In ED: started on vanc/zosyn. Podiatry consulted. Ortho consulted. Plan for BKA tomorrow.     Review of Systems: As mentioned in the history of present illness. All other systems reviewed and are negative. Past Medical History:  Diagnosis Date   Allergy    Anemia    Arthritis    ARTHRITIS IN SPINE - MAKES PT HAVE CHEST PAIN- USES fENTANYL PATCH    Asthma    IN TEENS    Bacteremia    a. 03/2022 Group B Strep bacteremia in setting of diabetic L foot infxn/gangrene/osteo.   Diabetes mellitus    Diabetic peripheral neuropathy associated with type 2 diabetes mellitus ([place]) 12/12/2011   Fatty liver disease, nonalcoholic 33/82/5053   Foot osteomyelitis, left ([place])    a. 2012 s/p toe amputations on L; b. 03/2022 - admitted w/ wet gangrene and necrotizing infxn w/ osteomyelitis-->s/p transmetatarsal followed by Lisfranc amputation.   GERD (gastroesophageal reflux disease)    H/O degenerative disc disease    History of echocardiogram    a. 03/2022 Echo: EF 55-60%, no rwma, nl RV fxn, mildly elev PASP. Mild MR.   Hyperlipidemia    Hypertension    Pneumonia    HX  OF X 3    Tobacco abuse    Urothelial cancer [facility])    Past Surgical History:  Procedure Laterality Date   ACHILLES TENDON SURGERY Left 03/28/2022   Procedure: ACHILLES LENGTHENING/KIDNER;  Surgeon: [name], DPM;  Location: [facility];  Service: Podiatry;  Laterality: Left;   AMPUTATION Left 03/25/2022   Procedure: AMPUTATION DIGITS,TRANSMETARSAL AMPUTATION, REMOVAL OF TOES AND BONE BEHIND TOES ;  Surgeon: [name],  DPM;  Location: [facility];  Service: Podiatry;  Laterality: Left;   APPLICATION OF WOUND VAC Left 03/25/2022   Procedure: APPLICATION OF WOUND VAC;  Surgeon: [name], DPM;  Location: [facility];  Service: Podiatry;  Laterality: Left;   CORONARY STENT INTERVENTION N/A 06/05/2022   Procedure: CORONARY STENT INTERVENTION;  Surgeon: Belva Crome, MD;  Location: [place] [facility];  Service: Cardiovascular;  Laterality: N/A;   CYSTOSCOPY/RETROGRADE/URETEROSCOPY Left 10/29/2020   Procedure: CYSTOSCOPY/RETROGRADE/ LEFT URETEROSCOPY WITH BIOPSY, LEFT URETERAL STENT;  Surgeon: Raynelle Bring, MD;  Location: [facility];  Service: Urology;  Laterality: Left;   IRRIGATION AND DEBRIDEMENT FOOT Left 03/28/2022   Procedure: AMPUTATION LISFRANC, SECONDARY WOUND CLOSURE;  Surgeon: [name], DPM;  Location: [facility];  Service: Podiatry;  Laterality: Left;   KNEE SURGERY Bilateral 1975 and 1989   LEFT HEART CATH AND CORONARY ANGIOGRAPHY N/A 06/05/2022   Procedure: LEFT HEART CATH AND CORONARY ANGIOGRAPHY;  Surgeon: Belva Crome, MD;  Location: [place] [facility];  Service: Cardiovascular;  Laterality: N/A;   MULTIPLE TOOTH EXTRACTIONS     ROBOT ASSITED LAPAROSCOPIC NEPHROURETERECTOMY Left 12/13/2020   Procedure: XI ROBOT ASSITED LAPAROSCOPIC NEPHROURETERECTOMY , POST OPERATIVE INTRAVESICAL GEMCITABINE;  Surgeon: Raynelle Bring, MD;  Location: [facility];  Service: Urology;  Laterality: Left;  ONLY NEEDS 240 MIN   TOEM AMPUTATION  Left    2012, second toe   TRANSURETHRAL RESECTION OF BLADDER TUMOR N/A 08/29/2021   Procedure: TRANSURETHRAL RESECTION OF BLADDER TUMOR (TURBT) WITH CYSTOSCOPY;  Surgeon: Raynelle Bring, MD;  Location: [facility];  Service: Urology;  Laterality: N/A;  GENERAL ANESTHESIA WITH PARALYSIS   WISDOM TOOTH EXTRACTION     Social History:  reports that he has been smoking pipe and cigarettes. He has a 10.00 pack-year smoking history. He has quit using smokeless tobacco.  His smokeless tobacco use  included chew. He reports that he does not drink alcohol and does not use drugs.  Allergies  Allergen Reactions   Amlodipine Rash   Prozac [Fluoxetine Hcl] Rash    Family History  Problem Relation Age of Onset   Cancer Mother    Hypertension Father    Diabetes Father    Lung cancer Father    Diabetes Sister    Cancer Sister     Prior to Admission medications   Medication Sig Start Date End Date Taking? Authorizing Provider  antiseptic oral rinse (BIOTENE) LIQD 15 mLs by Mouth Rinse route as needed for dry mouth.   Yes [provider]  apixaban (ELIQUIS) 5 MG TABS tablet Take 1 tablet (5 mg total) by mouth 2 (two) times daily. 06/06/22  Yes Florencia Reasons, MD  aspirin EC 81 MG tablet Take 1 tablet (81 mg total) by mouth daily. Swallow whole. For 30 days then stop 06/06/22 06/06/23 Yes Florencia Reasons, MD  atorvastatin (LIPITOR) 80 MG tablet Take 80 mg by mouth daily. 04/25/21  Yes [provider]  clopidogrel (PLAVIX) 75 MG tablet Take 1 tablet (75 mg total) by mouth daily. 06/07/22  Yes  Florencia Reasons, MD  dapagliflozin propanediol (FARXIGA) 10 MG TABS tablet Take 1 tablet (10 mg total) by mouth daily before breakfast. 06/26/22  Yes Agbor-Etang, Aaron Edelman, MD  Diclofenac Sodium (VOLTAREN ARTHRITIS PAIN EX) Apply 1 application  topically daily as needed (pain relief).   Yes [provider]  ezetimibe (ZETIA) 10 MG tablet Take 1 tablet (10 mg total) by mouth daily. 06/07/22  Yes Florencia Reasons, MD  fentaNYL (DURAGESIC) 75 MCG/HR Place 1 patch onto the skin every 3 (three) days. 06/06/22  Yes Florencia Reasons, MD  finasteride (PROSCAR) 5 MG tablet Take 5 mg by mouth daily.   Yes [provider]  furosemide (LASIX) 40 MG tablet Take 1 tablet (40 mg total) by mouth daily as needed for edema or fluid (Take 1 tablet if your weight increases more than 2 pounds in 24 hours.). 06/08/22 06/08/23 Yes Regalado, Belkys A, MD  insulin NPH-regular Human (70-30) 100 UNIT/ML injection Inject 10 Units into the skin  2 (two) times daily with a meal. RELION BRAND PLEASE 06/08/22  Yes Regalado, Belkys A, MD  metoprolol succinate (TOPROL-XL) 25 MG 24 hr tablet Take 3 tablets (75 mg total) by mouth daily. Patient taking differently: Take 25 mg by mouth 3 (three) times daily. 06/07/22  Yes Florencia Reasons, MD  nitroGLYCERIN (NITROSTAT) 0.4 MG SL tablet Place 1 tablet (0.4 mg total) under the tongue every 5 (five) minutes as needed for chest pain. 06/08/22 06/08/23 Yes Regalado, Belkys A, MD  pantoprazole (PROTONIX) 40 MG tablet Take 1 tablet (40 mg total) by mouth daily. 06/06/22  Yes Florencia Reasons, MD  polyethylene glycol (MIRALAX / GLYCOLAX) 17 g packet Take 17 g by mouth 2 (two) times daily. 06/08/22  Yes Regalado, Belkys A, MD  potassium chloride (KLOR-CON M) 10 MEQ tablet Take 2 tablets (20 mEq total) by mouth daily as needed (Take the days  you need to  take lasix.). 06/08/22  Yes Regalado, Belkys A, MD  FREESTYLE LITE test strip 1 each by Other route 3 (three) times daily. 04/30/22   Charlott Rakes, MD  Insulin Pen Needle (PEN NEEDLES 3/16") 31G X 5 MM MISC 28 Units by Does not apply route 2 (two) times daily. 04/04/22   Georgette Shell, MD  Insulin Syringe-Needle U-100 (RELION INSULIN SYRINGE 1ML/31G) 31G X 5/16" 1 ML MISC 1 each by Does not apply route in the morning and at bedtime. Use to inject Insulin 70/30 06/08/22   Regalado, Belkys A, MD  Lancets (FREESTYLE) lancets 1 each by Other route 3 (three) times daily. 04/30/22   Charlott Rakes, MD  losartan (COZAAR) 50 MG tablet Take 1 tablet (50 mg total) by mouth daily. 06/27/22 09/26/23  Kate Sable, MD    Physical Exam: Vitals:   07/22/22 1042 07/22/22 1100 07/22/22 1115 07/22/22 1457  BP:  127/89 (!) 142/124   Pulse:  (!) 102 86   Resp:  17 (!) 23   Temp: 98.8 F (37.1 C)   98.4 F (36.9 C)  TempSrc: Oral   Oral  SpO2:  99% 100%   Weight:      Height:       General:  Appears calm and comfortable and is in NAD Eyes:  PERRL, EOMI, normal lids, iris ENT:   grossly normal hearing, lips & tongue, mmm; appropriate dentition Neck:  no LAD, masses or thyromegaly; no carotid bruits Cardiovascular:  RRR, no m/r/g. +LLE edema, above ankle.  Respiratory:   CTA bilaterally with no wheezes/rales/rhonchi.  Normal respiratory effort.  Abdomen:  soft, NT, ND, NABS Back:   normal alignment, no CVAT Skin:  no rash or induration seen on limited exam Musculoskeletal:  grossly normal tone BUE/BLE, good ROM, no bony abnormality Lower extremity:  +LLE edema with large ulcer on bottom of foot with foul smelling drainage and deeply probable, >1cm. Medial aspect of ankle large edematous area, very tender to touch. Erythema to above the ankle.    Psychiatric:  grossly normal mood and affect, speech fluent and appropriate, AOx3 Neurologic:  CN 2-12 grossly intact, moves all extremities in coordinated fashion, sensation intact   Radiological Exams on Admission: Independently reviewed - see discussion in A/P where applicable  MR FOOT LEFT WO CONTRAST  Result Date: 07/22/2022 CLINICAL DATA:  Left foot ulcer and infection.  Prior amputation. EXAM: MRI OF THE LEFT FOOT WITHOUT CONTRAST TECHNIQUE: Multiplanar, multisequence MR imaging of the left foot was performed. No intravenous contrast was administered. COMPARISON:  Left foot x-rays from yesterday. MRI left foot dated March 25, 2022. FINDINGS: Bones/Joint/Cartilage Abnormal marrow edema involving the cuneiforms, navicular, and cuboid. Periarticular marrow edema involving the tibiotalar and posterior subtalar joints. Bony destruction of the medial and middle cuneiforms as well as the navicular. No fracture or dislocation. Small tibiotalar joint effusion. Ligaments Medial and lateral ankle ligaments are intact. Muscles and Tendons High-grade partial tear of the anterior tibialis tendon. Achilles tendinosis. Soft tissue Deep soft tissue ulceration at the plantar aspect of the medial cuneiform, extending to bone. Large  multiloculated fluid collection in the posterior hindfoot between the Achilles and distal tibia, measuring 9.3 x 4.6 x 10.8 cm. The fluid collection may communicate with the posterior ankle joint (series 3, image 17 and/or the flexor hallucis longus tendon sheath. There are few foci of air within the fluid collection. The fluid collection significantly displaces the posterior tibial neurovascular bundle posteriorly and medially. Diffuse circumferential soft tissue swelling and skin thickening. IMPRESSION: 1. Deep soft tissue ulceration at the plantar aspect of the medial cuneiform, extending to bone, with underlying osteomyelitis of the cuneiforms, cuboid, and navicular. 2. Large 10.8 cm multiloculated abscess in the posterior hindfoot between the Achilles and distal tibia. The abscess may communicate with the posterior ankle joint and/or the flexor hallucis longus tendon sheath. 3. Periarticular marrow edema involving the tibiotalar and posterior subtalar joints, with small tibiotalar joint effusion, concerning for septic arthritis and osteomyelitis. Electronically Signed   By: Titus Dubin M.D.   On: 07/22/2022 08:49   DG Foot Complete Left  Result Date: 07/21/2022 CLINICAL DATA:  Foul-smelling wound EXAM: LEFT FOOT - COMPLETE 3+ VIEW COMPARISON:  06/04/2022 FINDINGS: Transmetatarsal amputation is again identified and stable. Some generalized soft tissue swelling is noted in the foot ankle new from the prior exam. Increased lucency is noted in the residual first metatarsal and first cuneiform best noted on the frontal and lateral projections. These changes are consistent with progressive osteomyelitis. Soft tissue wound is noted along the plantar aspect consistent with the given clinical history. IMPRESSION: Increased lucency in the first metatarsal remnant and first cuneiform consistent with progressive osteomyelitis. Electronically Signed   By: Inez Catalina M.D.   On: 07/21/2022 21:33    EKG:  Independently reviewed.  NSR with rate 93; nonspecific ST changes with no evidence of acute ischemia   Labs on Admission: I have personally reviewed the available labs and imaging studies at the time of the admission.  Pertinent labs:   wbc: 13.9,  hgb: 8.8,  sodium: 129,  glucose: 171,  creatinine: 1.25,  ESR: 124, CRP: 26.5.  lactic acid wnl.  BC obtained  Assessment and Plan: Principal Problem:   Foot osteomyelitis, left ([place]) Active Problems:   Type 2 diabetes mellitus with complication, with long-term current use of insulin ([facility])   Hyponatremia   CAD S/P percutaneous coronary angioplasty   Ischemic cardiomyopathy   Hypertension   PAF (paroxysmal atrial fibrillation) ([facility])   Normocytic anemia   Hyperlipidemia   CKD (chronic kidney disease) stage 2, GFR 60-89 ml/min   Tobacco abuse    Assessment and Plan: * Foot osteomyelitis, left ([place]) 61 year old with known left foot diabetic ulcer presenting with worsening pain, foul smelling drainage and swelling x 1 week found to have  Osteomyelitis, 10.8cm multiloculated abscess in posterior hindfoot and concner for septic arthritis vs. OM of tibiotalar and posterior subtalar joints off MRI -admit to telemetry -leukocytosis, no fever/tachycardia or other criteria for sepsis. Lactic acid wnl  -BC obtained  -inflammatory markers elevated -podiatry initially consulted and recommended ortho.  -Dr. Sharol Given to do BKA tomorrow per EDP -continue vanc/zosyn -pain medication with oxycodone and morphine for severe pain -on plavix/eliquis for recent DES to LAD in 05/2022 and PAF. Last took yesterday. Dr. Sharol Given okay to continue with plavix.  -ABI normal in 03/2022 -NPO at midnight     Type 2 diabetes mellitus with complication, with long-term current use of insulin ([place]) Poorly controlled A1C in 03/2022 was 14.2, repeat today On 70-30 insulin. Change to long acting-10 units (give 5 today). Will give lower dose tonight since NPO at midnight  for surgery tomorrow SSI and accucheck qac/hs Continue farxiga   Hyponatremia Corrects to 130 with sugar Some component of hypovolemia, poor PO intake over past few days.  Gentle, time limited IVF x 8 hours Strict I/O Trend   CAD S/P percutaneous coronary angioplasty NSTEMI CAD s/p DES to proximal LAD 06/05/2022 Was on triple therapy with ASA, plavix and eliquis x1 month and has been continued on eliquis/plavix  Hold due to BKA plans for tomorrow, but will need to start back at least plavix with history of recent DES less than 3 months ago.  Discussed with Dr. Sharol Given, okay for plavix  Continue statin, toprol  Ischemic cardiomyopathy Euvolemic to dry Echo 05/2022: EF of 40-45%  Strict I/O Continue toprol and farxiga Cardiology considering entresto if renal function allows   PAF (paroxysmal atrial fibrillation) ([place]) New diagnosis from hospitalization in august for NSTEMI ekg pending, NSR on exam Telemetry Hold eliquis with surgery and continue toprol    Hypertension Continue metoprolol 38m TID  PRN parameters written for sbp >180  Normocytic anemia Baseline 9-10, 8.8 today Check iron studies Follow   Hyperlipidemia Continue lipitor 867mand zetia 1041maily   CKD (chronic kidney disease) stage 2, GFR 60-89 ml/min Stable, continue to monitor with vanc/zosyn   Tobacco abuse Smokes a pipe daily, requesting nicotine patch     Advance Care Planning:   Code Status: Full Code   Consults: podiatry/ortho: Dr. DudSharol GivenDVT Prophylaxis: SCDs   Family Communication: updated alton may by phone. [phone] of Illness: The appropriate patient status for this patient is INPATIENT. Inpatient status is judged to be reasonable and necessary in order to provide the required intensity of service to ensure the patient's safety. The patient's presenting symptoms, physical exam findings, and initial radiographic and laboratory data in the context of their chronic  comorbidities is felt to place them at high risk for further clinical deterioration.  Furthermore, it is not anticipated that the patient will be medically stable for discharge from the hospital within 2 midnights of admission.   * I certify that at the point of admission it is my clinical judgment that the patient will require inpatient hospital care spanning beyond 2 midnights from the point of admission due to high intensity of service, high risk for further deterioration and high frequency of surveillance required.*  Author: Orma Flaming, MD 07/22/2022 3:24 PM  For on call review [email or web address].

## 2022-07-22 NOTE — Assessment & Plan Note (Signed)
Baseline 9-10, 8.8 today Check iron studies Follow

## 2022-07-22 NOTE — Assessment & Plan Note (Signed)
Stable, continue to monitor with vanc/zosyn

## 2022-07-22 NOTE — Assessment & Plan Note (Addendum)
Poorly controlled A1C in 03/2022 was 14.2, repeat today On 70-30 insulin. Change to long acting-10 units (give 5 today). Will give lower dose tonight since NPO at midnight for surgery tomorrow SSI and accucheck qac/hs Continue farxiga

## 2022-07-22 NOTE — Assessment & Plan Note (Addendum)
61 year old with known left foot diabetic ulcer presenting with worsening pain, foul smelling drainage and swelling x 1 week found to have  Osteomyelitis, 10.8cm multiloculated abscess in posterior hindfoot and concner for septic arthritis vs. OM of tibiotalar and posterior subtalar joints off MRI -admit to telemetry -leukocytosis, no fever/tachycardia or other criteria for sepsis. Lactic acid wnl  -BC obtained  -inflammatory markers elevated -podiatry initially consulted and recommended ortho.  -Dr. Sharol Given to do BKA tomorrow per EDP -continue vanc/zosyn -pain medication with oxycodone and morphine for severe pain -on plavix/eliquis for recent DES to LAD in 05/2022 and PAF. Last took yesterday. Dr. Sharol Given okay to continue with plavix.  -ABI normal in 03/2022 -NPO at midnight

## 2022-07-22 NOTE — ED Notes (Signed)
Received verbal report from [place] at this time

## 2022-07-22 NOTE — Assessment & Plan Note (Signed)
Continue lipitor '80mg'$  and zetia '10mg'$  daily

## 2022-07-22 NOTE — Assessment & Plan Note (Signed)
New diagnosis from hospitalization in august for NSTEMI ekg pending, NSR on exam Telemetry Hold eliquis with surgery and continue toprol

## 2022-07-22 NOTE — Progress Notes (Signed)
Pharmacy Antibiotic Note  Robert Burnett is a 61 y.o. male admitted on 07/21/2022 with  osteomyelitis on X-ray .  Pharmacy has been consulted for vancomycin dosing. Presented with left foot ulcer, redness, pain and welling. He has a history of osteomyelitis in L foot which has a partial amputation.  Plan: Initiate vancomycin 2,000 mg LD once followed by vancomycin 1,750 mg every 24 hours with goal AUC 400-600 (eAUC 483).  Monitor renal function and obtain vancomycin levels at steady state. Monitor blood cultures and follow up source control/intervention plans and ability to narrow.    Height: '6\' 1"'$  (185.4 cm) Weight: 81.2 kg (179 lb) IBW/kg (Calculated) : 79.9  Temp (24hrs), Avg:98.7 F (37.1 C), Min:98.4 F (36.9 C), Max:99.2 F (37.3 C)  Recent Labs  Lab 07/21/22 2055  WBC 13.9*  CREATININE 1.25*  LATICACIDVEN 1.4    Estimated Creatinine Clearance: 69.2 mL/min (A) (by C-G formula based on SCr of 1.25 mg/dL (H)).    Allergies  Allergen Reactions   Amlodipine Rash   Prozac [Fluoxetine Hcl] Rash    Antimicrobials this admission: Zosyn 3.375 mg Q8h 10/3 >   Thank you for allowing pharmacy to be a part of this patient's care.  Sinda Du, PharmD Candidate 07/22/2022 8:47 AM

## 2022-07-22 NOTE — Assessment & Plan Note (Signed)
Corrects to 130 with sugar Some component of hypovolemia, poor PO intake over past few days.  Gentle, time limited IVF x 8 hours Strict I/O Trend

## 2022-07-22 NOTE — Consult Note (Signed)
Reason for Consult:Osteomyelitis, abscess left foot Referring Physician: DR. Cindee Lame, MD  Robert Burnett is an 61 y.o. male.  HPI: 61 y.o. male with medical history significant of  HTN, T2DM, fatty liver, HLD, hx of urothelial cancer, diabetic neuropathy who presented to ED with complaints of worsening left foot pain, foul drainage and swelling x 1.5 weeks.  Patient states he did miss his last appointment the office due to other issues.  He was admitted in 03/2022 for diabetic foot infection with wet gangrene and necrotizing infection and osteomyelitis. S/p transmetatarsal amputation of the left foot with I&D by podiatry on 03/25/22 then OR again on 6/9 with Lisfranc amputation. ABI normal.   Podiatry been consulted for osteomyelitis, abscess.  Past Medical History:  Diagnosis Date   Allergy    Anemia    Arthritis    ARTHRITIS IN SPINE - MAKES PT HAVE CHEST PAIN- USES fENTANYL PATCH    Asthma    IN TEENS    Bacteremia    a. 03/2022 Group B Strep bacteremia in setting of diabetic L foot infxn/gangrene/osteo.   Diabetes mellitus    Diabetic peripheral neuropathy associated with type 2 diabetes mellitus ([place]) 12/12/2011   Fatty liver disease, nonalcoholic 76/73/4193   Foot osteomyelitis, left ([place])    a. 2012 s/p toe amputations on L; b. 03/2022 - admitted w/ wet gangrene and necrotizing infxn w/ osteomyelitis-->s/p transmetatarsal followed by Lisfranc amputation.   GERD (gastroesophageal reflux disease)    H/O degenerative disc disease    History of echocardiogram    a. 03/2022 Echo: EF 55-60%, no rwma, nl RV fxn, mildly elev PASP. Mild MR.   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[facility])     Past Surgical History:  Procedure Laterality Date   ACHILLES TENDON SURGERY Left 03/28/2022   Procedure: ACHILLES LENGTHENING/KIDNER;  Surgeon: [name], DPM;  Location: [facility];  Service: Podiatry;  Laterality: Left;   AMPUTATION  Left 03/25/2022   Procedure: AMPUTATION DIGITS,TRANSMETARSAL AMPUTATION, REMOVAL OF TOES AND BONE BEHIND TOES ;  Surgeon: [name], DPM;  Location: [facility];  Service: Podiatry;  Laterality: Left;   APPLICATION OF WOUND VAC Left 03/25/2022   Procedure: APPLICATION OF WOUND VAC;  Surgeon: [name], DPM;  Location: [facility];  Service: Podiatry;  Laterality: Left;   CORONARY STENT INTERVENTION N/A 06/05/2022   Procedure: CORONARY STENT INTERVENTION;  Surgeon: Belva Crome, MD;  Location: [place] [facility];  Service: Cardiovascular;  Laterality: N/A;   CYSTOSCOPY/RETROGRADE/URETEROSCOPY Left 10/29/2020   Procedure: CYSTOSCOPY/RETROGRADE/ LEFT URETEROSCOPY WITH BIOPSY, LEFT URETERAL STENT;  Surgeon: Raynelle Bring, MD;  Location: [facility];  Service: Urology;  Laterality: Left;   IRRIGATION AND DEBRIDEMENT FOOT Left 03/28/2022   Procedure: AMPUTATION LISFRANC, SECONDARY WOUND CLOSURE;  Surgeon: [name], DPM;  Location: [facility];  Service: Podiatry;  Laterality: Left;   KNEE SURGERY Bilateral 1975 and 1989   LEFT HEART CATH AND CORONARY ANGIOGRAPHY N/A 06/05/2022   Procedure: LEFT HEART CATH AND CORONARY ANGIOGRAPHY;  Surgeon: Belva Crome, MD;  Location: [place] [facility];  Service: Cardiovascular;  Laterality: N/A;   MULTIPLE TOOTH EXTRACTIONS     ROBOT ASSITED LAPAROSCOPIC NEPHROURETERECTOMY Left 12/13/2020   Procedure: XI ROBOT ASSITED LAPAROSCOPIC NEPHROURETERECTOMY , POST OPERATIVE INTRAVESICAL GEMCITABINE;  Surgeon: Raynelle Bring, MD;  Location: [facility];  Service: Urology;  Laterality: Left;  ONLY NEEDS 240 MIN   TOEM AMPUTATION  Left    2012, second toe   TRANSURETHRAL RESECTION OF BLADDER TUMOR N/A 08/29/2021   Procedure: TRANSURETHRAL RESECTION OF BLADDER TUMOR (TURBT) WITH CYSTOSCOPY;  Surgeon: Raynelle Bring, MD;  Location: [facility];  Service: Urology;  Laterality: N/A;  GENERAL ANESTHESIA WITH PARALYSIS   WISDOM TOOTH EXTRACTION      Family History  Problem Relation Age of  Onset   Cancer Mother    Hypertension Father    Diabetes Father    Lung cancer Father    Diabetes Sister    Cancer Sister     Social History:  reports that he has been smoking pipe and cigarettes. He has a 10.00 pack-year smoking history. He has quit using smokeless tobacco.  His smokeless tobacco use included chew. He reports that he does not drink alcohol and does not use drugs.  Allergies:  Allergies  Allergen Reactions   Amlodipine Rash   Prozac [Fluoxetine Hcl] Rash    Medications: I have reviewed the patient's current medications.  Results for orders placed or performed during the hospital encounter of 07/21/22 (from the past 48 hour(s))  Comprehensive metabolic panel     Status: Abnormal   Collection Time: 07/21/22  8:55 PM  Result Value Ref Range   Sodium 129 (L) 135 - 145 mmol/L   Potassium 3.8 3.5 - 5.1 mmol/L   Chloride 95 (L) 98 - 111 mmol/L   CO2 22 22 - 32 mmol/L   Glucose, Bld 171 (H) 70 - 99 mg/dL    Comment: Glucose reference range applies only to samples taken after fasting for at least 8 hours.   BUN 15 8 - 23 mg/dL   Creatinine, Ser 1.25 (H) 0.61 - 1.24 mg/dL   Calcium 8.2 (L) 8.9 - 10.3 mg/dL   Total Protein 8.0 6.5 - 8.1 g/dL   Albumin 2.3 (L) 3.5 - 5.0 g/dL   AST 16 15 - 41 U/L   ALT 13 0 - 44 U/L   Alkaline Phosphatase 93 38 - 126 U/L   Total Bilirubin 0.9 0.3 - 1.2 mg/dL   GFR, Estimated >60 >60 mL/min    Comment: (NOTE) Calculated using the CKD-EPI Creatinine Equation (2021)    Anion gap 12 5 - 15    Comment: Performed at [facility], [place] [address] [place], [place] [address]  CBC with Differential     Status: Abnormal   Collection Time: 07/21/22  8:55 PM  Result Value Ref Range   WBC 13.9 (H) 4.0 - 10.5 K/uL   RBC 3.39 (L) 4.22 - 5.81 MIL/uL   Hemoglobin 8.8 (L) 13.0 - 17.0 g/dL   HCT 28.0 (L) 39.0 - 52.0 %   MCV 82.6 80.0 - 100.0 fL   MCH 26.0 26.0 - 34.0 pg   MCHC 31.4 30.0 - 36.0 g/dL   RDW 14.6 11.5 - 15.5 %   Platelets  247 150 - 400 K/uL   nRBC 0.0 0.0 - 0.2 %   Neutrophils Relative % 89 %   Neutro Abs 12.5 (H) 1.7 - 7.7 K/uL   Lymphocytes Relative 5 %   Lymphs Abs 0.6 (L) 0.7 - 4.0 K/uL   Monocytes Relative 5 %   Monocytes Absolute 0.7 0.1 - 1.0 K/uL   Eosinophils Relative 0 %   Eosinophils Absolute 0.0 0.0 - 0.5 K/uL   Basophils Relative 0 %   Basophils Absolute 0.0 0.0 - 0.1 K/uL   Immature Granulocytes  1 %   Abs Immature Granulocytes 0.09 (H) 0.00 - 0.07 K/uL    Comment: Performed at [facility], [place] [address] [place], [place] [address]  Lactic acid, plasma     Status: None   Collection Time: 07/21/22  8:55 PM  Result Value Ref Range   Lactic Acid, Venous 1.4 0.5 - 1.9 mmol/L    Comment: Performed at [facility] [address], [place], [place] [address]  Sedimentation rate     Status: Abnormal   Collection Time: 07/22/22  8:30 AM  Result Value Ref Range   Sed Rate 124 (H) 0 - 16 mm/hr    Comment: Performed at [facility] [address] [place], [place] [address]  C-reactive protein     Status: Abnormal   Collection Time: 07/22/22  8:30 AM  Result Value Ref Range   CRP 26.5 (H) <1.0 mg/dL    Comment: Performed at [facility] [address] [place], [place] [address]  Hemoglobin A1c     Status: Abnormal   Collection Time: 07/22/22  8:30 AM  Result Value Ref Range   Hgb A1c MFr Bld 6.8 (H) 4.8 - 5.6 %    Comment: (NOTE) Pre diabetes:          5.7%-6.4%  Diabetes:              >6.4%  Glycemic control for   <7.0% adults with diabetes    Mean Plasma Glucose 148.46 mg/dL    Comment: Performed at [facility] [address] [place], [place] [address]  CBC     Status: Abnormal   Collection Time: 07/22/22  8:30 AM  Result Value Ref Range   WBC 11.8 (H) 4.0 - 10.5 K/uL   RBC 3.79 (L) 4.22 - 5.81 MIL/uL   Hemoglobin 9.9 (L) 13.0 - 17.0 g/dL   HCT 30.5 (L) 39.0 - 52.0 %   MCV 80.5 80.0 - 100.0 fL   MCH 26.1 26.0 - 34.0 pg   MCHC 32.5 30.0 -  36.0 g/dL   RDW 14.7 11.5 - 15.5 %   Platelets 281 150 - 400 K/uL   nRBC 0.0 0.0 - 0.2 %    Comment: Performed at [facility], [place] [address] [place], [place] [address]  Basic metabolic panel     Status: Abnormal   Collection Time: 07/22/22  8:30 AM  Result Value Ref Range   Sodium 131 (L) 135 - 145 mmol/L   Potassium 4.1 3.5 - 5.1 mmol/L   Chloride 93 (L) 98 - 111 mmol/L   CO2 23 22 - 32 mmol/L   Glucose, Bld 157 (H) 70 - 99 mg/dL    Comment: Glucose reference range applies only to samples taken after fasting for at least 8 hours.   BUN 22 8 - 23 mg/dL   Creatinine, Ser 1.68 (H) 0.61 - 1.24 mg/dL   Calcium 9.0 8.9 - 10.3 mg/dL   GFR, Estimated 46 (L) >60 mL/min    Comment: (NOTE) Calculated using the CKD-EPI Creatinine Equation (2021)    Anion gap 15 5 - 15    Comment: Performed at [facility] [address], [place], [place] [address]  Ferritin     Status: None   Collection Time: 07/22/22  8:30 AM  Result Value Ref Range   Ferritin 161 24 - 336 ng/mL    Comment: Performed at [facility]  [address], [place], [place] [address]  Iron and TIBC     Status: Abnormal   Collection Time: 07/22/22  8:30 AM  Result Value Ref Range   Iron 25 (L) 45 - 182 ug/dL   TIBC 301 250 - 450 ug/dL   Saturation Ratios 8 (L) 17.9 - 39.5 %   UIBC 276 ug/dL    Comment: Performed at [facility], [place] [address] [place], [place] [address]  CBG monitoring, ED     Status: Abnormal   Collection Time: 07/22/22  4:33 PM  Result Value Ref Range   Glucose-Capillary 177 (H) 70 - 99 mg/dL    Comment: Glucose reference range applies only to samples taken after fasting for at least 8 hours.    MR FOOT LEFT WO CONTRAST  Result Date: 07/22/2022 CLINICAL DATA:  Left foot ulcer and infection.  Prior amputation. EXAM: MRI OF THE LEFT FOOT WITHOUT CONTRAST TECHNIQUE: Multiplanar, multisequence MR imaging of the left foot was performed. No intravenous contrast was administered.  COMPARISON:  Left foot x-rays from yesterday. MRI left foot dated March 25, 2022. FINDINGS: Bones/Joint/Cartilage Abnormal marrow edema involving the cuneiforms, navicular, and cuboid. Periarticular marrow edema involving the tibiotalar and posterior subtalar joints. Bony destruction of the medial and middle cuneiforms as well as the navicular. No fracture or dislocation. Small tibiotalar joint effusion. Ligaments Medial and lateral ankle ligaments are intact. Muscles and Tendons High-grade partial tear of the anterior tibialis tendon. Achilles tendinosis. Soft tissue Deep soft tissue ulceration at the plantar aspect of the medial cuneiform, extending to bone. Large multiloculated fluid collection in the posterior hindfoot between the Achilles and distal tibia, measuring 9.3 x 4.6 x 10.8 cm. The fluid collection may communicate with the posterior ankle joint (series 3, image 17 and/or the flexor hallucis longus tendon sheath. There are few foci of air within the fluid collection. The fluid collection significantly displaces the posterior tibial neurovascular bundle posteriorly and medially. Diffuse circumferential soft tissue swelling and skin thickening. IMPRESSION: 1. Deep soft tissue ulceration at the plantar aspect of the medial cuneiform, extending to bone, with underlying osteomyelitis of the cuneiforms, cuboid, and navicular. 2. Large 10.8 cm multiloculated abscess in the posterior hindfoot between the Achilles and distal tibia. The abscess may communicate with the posterior ankle joint and/or the flexor hallucis longus tendon sheath. 3. Periarticular marrow edema involving the tibiotalar and posterior subtalar joints, with small tibiotalar joint effusion, concerning for septic arthritis and osteomyelitis. Electronically Signed   By: Titus Dubin M.D.   On: 07/22/2022 08:49   DG Foot Complete Left  Result Date: 07/21/2022 CLINICAL DATA:  Foul-smelling wound EXAM: LEFT FOOT - COMPLETE 3+ VIEW COMPARISON:   06/04/2022 FINDINGS: Transmetatarsal amputation is again identified and stable. Some generalized soft tissue swelling is noted in the foot ankle new from the prior exam. Increased lucency is noted in the residual first metatarsal and first cuneiform best noted on the frontal and lateral projections. These changes are consistent with progressive osteomyelitis. Soft tissue wound is noted along the plantar aspect consistent with the given clinical history. IMPRESSION: Increased lucency in the first metatarsal remnant and first cuneiform consistent with progressive osteomyelitis. Electronically Signed   By: Inez Catalina M.D.   On: 07/21/2022 21:33    Review of Systems Blood pressure 125/62, pulse 83, temperature 98.4 F (36.9 C), temperature source Oral, resp. rate 14, height '6\' 1"'$  (1.854 m), weight 81.2 kg, SpO2 100 %. Physical Exam General: AAO x3, NAD  Dermatological: Large full-thickness  ulceration submetatarsal remnant 1 left foot which is actively draining and some purulence coming from the wound.  Extensive edema and erythema present to the foot and ankle and there is some fullness present along the ankle consistent with an abscess.  No wounds on the right foot.  Vascular: DP pulse palpable.  Edema around the ankle limits PT evaluation.  Neruologic: Sensation decreased  Musculoskeletal: Mild pain along the ankle.  Assessment/Plan: Osteomyelitis left foot, large abscess left ankle  Reviewed the MRI.  I have discussed the findings with the patient.  This time I do think his best option is a below-knee amputation.  Discussed the procedure and he is in agreement to proceed with this.  Dr. Sharol Given has already been consulted as well and it looks like he is scheduled for surgery tomorrow.  I will defer further treatment to Dr. Sharol Given.  Discussed with patient long-term I would like to still follow-up with him in the office of the right foot to help prevent other issues  Podiatry will sign off.  Please  let us know if we can be of further assistance.   Robert Burnett 07/22/2022, 6:23 PM

## 2022-07-22 NOTE — Assessment & Plan Note (Signed)
Smokes a pipe daily, requesting nicotine patch

## 2022-07-22 NOTE — Assessment & Plan Note (Addendum)
Continue metoprolol '25mg'$  TID  PRN parameters written for sbp >180

## 2022-07-22 NOTE — Assessment & Plan Note (Addendum)
NSTEMI CAD s/p DES to proximal LAD 06/05/2022 Was on triple therapy with ASA, plavix and eliquis x1 month and has been continued on eliquis/plavix  Hold due to BKA plans for tomorrow, but will need to start back at least plavix with history of recent DES less than 3 months ago.  Discussed with Dr. Sharol Given, okay for plavix  Continue statin, toprol

## 2022-07-23 ENCOUNTER — Encounter (HOSPITAL_COMMUNITY)
Admission: EM | Disposition: A | Discharge status: Rehabilitation Facility | Payer: Self-pay | Source: Home / Self Care | Attending: Internal Medicine

## 2022-07-23 ENCOUNTER — Inpatient Hospital Stay (HOSPITAL_COMMUNITY): Payer: Commercial Managed Care - HMO | Admitting: Anesthesiology

## 2022-07-23 ENCOUNTER — Encounter (HOSPITAL_COMMUNITY): Payer: Self-pay | Admitting: Family Medicine

## 2022-07-23 DIAGNOSIS — E43 Unspecified severe protein-calorie malnutrition: Secondary | ICD-10-CM | POA: Diagnosis not present

## 2022-07-23 DIAGNOSIS — D649 Anemia, unspecified: Secondary | ICD-10-CM

## 2022-07-23 DIAGNOSIS — M869 Osteomyelitis, unspecified: Secondary | ICD-10-CM

## 2022-07-23 DIAGNOSIS — L03116 Cellulitis of left lower limb: Secondary | ICD-10-CM

## 2022-07-23 DIAGNOSIS — Z7984 Long term (current) use of oral hypoglycemic drugs: Secondary | ICD-10-CM

## 2022-07-23 DIAGNOSIS — L02416 Cutaneous abscess of left lower limb: Secondary | ICD-10-CM

## 2022-07-23 DIAGNOSIS — Z794 Long term (current) use of insulin: Secondary | ICD-10-CM

## 2022-07-23 DIAGNOSIS — E871 Hypo-osmolality and hyponatremia: Secondary | ICD-10-CM | POA: Diagnosis not present

## 2022-07-23 DIAGNOSIS — M86272 Subacute osteomyelitis, left ankle and foot: Secondary | ICD-10-CM | POA: Diagnosis not present

## 2022-07-23 DIAGNOSIS — E1169 Type 2 diabetes mellitus with other specified complication: Secondary | ICD-10-CM

## 2022-07-23 HISTORY — PX: AMPUTATION: SHX166

## 2022-07-23 LAB — BASIC METABOLIC PANEL
Anion gap: 12 (ref 5–15)
BUN: 20 mg/dL (ref 8–23)
CO2: 23 mmol/L (ref 22–32)
Calcium: 8.5 mg/dL — ABNORMAL LOW (ref 8.9–10.3)
Chloride: 95 mmol/L — ABNORMAL LOW (ref 98–111)
Creatinine, Ser: 1.32 mg/dL — ABNORMAL HIGH (ref 0.61–1.24)
GFR, Estimated: >60 mL/min (ref >60)
Glucose, Bld: 121 mg/dL — ABNORMAL HIGH (ref 70–99)
Potassium: 4 mmol/L (ref 3.5–5.1)
Sodium: 130 mmol/L — ABNORMAL LOW (ref 135–145)

## 2022-07-23 LAB — CBC
HCT: 22.7 % — ABNORMAL LOW (ref 39.0–52.0)
Hemoglobin: 7.5 g/dL — ABNORMAL LOW (ref 13.0–17.0)
MCH: 26.4 pg (ref 26.0–34.0)
MCHC: 33 g/dL (ref 30.0–36.0)
MCV: 79.9 fL — ABNORMAL LOW (ref 80.0–100.0)
Platelets: 158 10*3/uL (ref 150–400)
RBC: 2.84 MIL/uL — ABNORMAL LOW (ref 4.22–5.81)
RDW: 14.8 % (ref 11.5–15.5)
WBC: 7.4 10*3/uL (ref 4.0–10.5)
nRBC: 0 % (ref 0.0–0.2)

## 2022-07-23 LAB — CBC WITH DIFFERENTIAL/PLATELET
Abs Immature Granulocytes: 0.06 10*3/uL (ref 0.00–0.07)
Basophils Absolute: 0 10*3/uL (ref 0.0–0.1)
Basophils Relative: 0 %
Eosinophils Absolute: 0 10*3/uL (ref 0.0–0.5)
Eosinophils Relative: 0 %
HCT: 26.1 % — ABNORMAL LOW (ref 39.0–52.0)
Hemoglobin: 8.5 g/dL — ABNORMAL LOW (ref 13.0–17.0)
Immature Granulocytes: 1 %
Lymphocytes Relative: 6 %
Lymphs Abs: 0.6 10*3/uL — ABNORMAL LOW (ref 0.7–4.0)
MCH: 26.3 pg (ref 26.0–34.0)
MCHC: 32.6 g/dL (ref 30.0–36.0)
MCV: 80.8 fL (ref 80.0–100.0)
Monocytes Absolute: 0.4 10*3/uL (ref 0.1–1.0)
Monocytes Relative: 4 %
Neutro Abs: 8.8 10*3/uL — ABNORMAL HIGH (ref 1.7–7.7)
Neutrophils Relative %: 89 %
Platelets: 183 10*3/uL (ref 150–400)
RBC: 3.23 MIL/uL — ABNORMAL LOW (ref 4.22–5.81)
RDW: 14.6 % (ref 11.5–15.5)
WBC: 9.9 10*3/uL (ref 4.0–10.5)
nRBC: 0 % (ref 0.0–0.2)

## 2022-07-23 LAB — GLUCOSE, CAPILLARY
Glucose-Capillary: 114 mg/dL — ABNORMAL HIGH (ref 70–99)
Glucose-Capillary: 127 mg/dL — ABNORMAL HIGH (ref 70–99)
Glucose-Capillary: 127 mg/dL — ABNORMAL HIGH (ref 70–99)
Glucose-Capillary: 137 mg/dL — ABNORMAL HIGH (ref 70–99)
Glucose-Capillary: 175 mg/dL — ABNORMAL HIGH (ref 70–99)

## 2022-07-23 LAB — PREPARE RBC (CROSSMATCH)

## 2022-07-23 LAB — SURGICAL PCR SCREEN
MRSA, PCR: NEGATIVE
Staphylococcus aureus: POSITIVE — AB

## 2022-07-23 LAB — PREALBUMIN: Prealbumin: 5 mg/dL — ABNORMAL LOW (ref 18–38)

## 2022-07-23 SURGERY — AMPUTATION BELOW KNEE
Anesthesia: Monitor Anesthesia Care | Site: Knee | Laterality: Left

## 2022-07-23 MED ORDER — LABETALOL HCL 5 MG/ML IV SOLN: 10.0000 mg | INTRAVENOUS | Status: DC | PRN

## 2022-07-23 MED ORDER — PROPOFOL 500 MG/50ML IV EMUL
INTRAVENOUS | Status: DC | PRN
  Administered 2022-07-23: 75 ug/kg/min via INTRAVENOUS

## 2022-07-23 MED ORDER — SODIUM CHLORIDE 0.9 % IV SOLN: INTRAVENOUS | Status: DC

## 2022-07-23 MED ORDER — FENTANYL CITRATE (PF) 250 MCG/5ML IJ SOLN
INTRAMUSCULAR | Status: AC
  Filled 2022-07-23: qty 5

## 2022-07-23 MED ORDER — MIDAZOLAM HCL 2 MG/2ML IJ SOLN: INTRAMUSCULAR | Status: DC | PRN

## 2022-07-23 MED ORDER — CHLORHEXIDINE GLUCONATE 0.12 % MT SOLN
OROMUCOSAL | Status: AC
  Administered 2022-07-23: 15 mL via OROMUCOSAL
  Filled 2022-07-23: qty 15

## 2022-07-23 MED ORDER — PANTOPRAZOLE SODIUM 40 MG PO TBEC
40.0000 mg | DELAYED_RELEASE_TABLET | Freq: Every day | ORAL | Status: DC
  Administered 2022-07-24 – 2022-07-25 (×2): 40 mg via ORAL
  Filled 2022-07-23 (×2): qty 1

## 2022-07-23 MED ORDER — PANTOPRAZOLE SODIUM 40 MG PO TBEC
40.0000 mg | DELAYED_RELEASE_TABLET | Freq: Every day | ORAL | Status: DC
  Administered 2022-07-23: 40 mg via ORAL
  Filled 2022-07-23: qty 1

## 2022-07-23 MED ORDER — MAGNESIUM SULFATE 2 GM/50ML IV SOLN: 2.0000 g | Freq: Every day | INTRAVENOUS | Status: DC | PRN

## 2022-07-23 MED ORDER — CHLORHEXIDINE GLUCONATE 4 % EX LIQD
60.0000 mL | Freq: Once | CUTANEOUS | Status: DC
  Filled 2022-07-23: qty 60

## 2022-07-23 MED ORDER — DOCUSATE SODIUM 100 MG PO CAPS
100.0000 mg | ORAL_CAPSULE | Freq: Every day | ORAL | Status: DC
  Administered 2022-07-24 – 2022-07-25 (×2): 100 mg via ORAL
  Filled 2022-07-23 (×2): qty 1

## 2022-07-23 MED ORDER — PHENOL 1.4 % MT LIQD: 1.0000 | OROMUCOSAL | Status: DC | PRN

## 2022-07-23 MED ORDER — MIDAZOLAM HCL 2 MG/2ML IJ SOLN: 2.0000 mg | Freq: Once | INTRAMUSCULAR | Status: AC

## 2022-07-23 MED ORDER — GUAIFENESIN-DM 100-10 MG/5ML PO SYRP: 15.0000 mL | ORAL_SOLUTION | ORAL | Status: DC | PRN

## 2022-07-23 MED ORDER — DAPAGLIFLOZIN PROPANEDIOL 10 MG PO TABS
10.0000 mg | ORAL_TABLET | Freq: Every day | ORAL | Status: DC
  Administered 2022-07-23 – 2022-07-25 (×3): 10 mg via ORAL
  Filled 2022-07-23 (×3): qty 1

## 2022-07-23 MED ORDER — ACETAMINOPHEN 325 MG PO TABS: 325.0000 mg | ORAL_TABLET | Freq: Four times a day (QID) | ORAL | Status: DC | PRN

## 2022-07-23 MED ORDER — LACTATED RINGERS IV SOLN: INTRAVENOUS | Status: DC

## 2022-07-23 MED ORDER — JUVEN PO PACK
1.0000 | PACK | Freq: Two times a day (BID) | ORAL | Status: DC
  Administered 2022-07-23 – 2022-07-25 (×5): 1 via ORAL
  Filled 2022-07-23 (×5): qty 1

## 2022-07-23 MED ORDER — CLOPIDOGREL BISULFATE 75 MG PO TABS
75.0000 mg | ORAL_TABLET | Freq: Every day | ORAL | Status: DC
  Administered 2022-07-23 – 2022-07-25 (×3): 75 mg via ORAL
  Filled 2022-07-23 (×3): qty 1

## 2022-07-23 MED ORDER — METOPROLOL SUCCINATE ER 25 MG PO TB24
25.0000 mg | ORAL_TABLET | Freq: Three times a day (TID) | ORAL | Status: DC
  Administered 2022-07-23 – 2022-07-25 (×8): 25 mg via ORAL
  Filled 2022-07-23 (×8): qty 1

## 2022-07-23 MED ORDER — PROMETHAZINE HCL 25 MG/ML IJ SOLN: 6.2500 mg | INTRAMUSCULAR | Status: DC | PRN

## 2022-07-23 MED ORDER — ATORVASTATIN CALCIUM 80 MG PO TABS
80.0000 mg | ORAL_TABLET | Freq: Every day | ORAL | Status: DC
  Administered 2022-07-23 – 2022-07-25 (×3): 80 mg via ORAL
  Filled 2022-07-23 (×3): qty 1

## 2022-07-23 MED ORDER — MUPIROCIN 2 % EX OINT
1.0000 | TOPICAL_OINTMENT | Freq: Two times a day (BID) | CUTANEOUS | Status: DC
  Administered 2022-07-23 – 2022-07-25 (×5): 1 via NASAL
  Filled 2022-07-23: qty 22

## 2022-07-23 MED ORDER — BISACODYL 5 MG PO TBEC: 5.0000 mg | DELAYED_RELEASE_TABLET | Freq: Every day | ORAL | Status: DC | PRN

## 2022-07-23 MED ORDER — POTASSIUM CHLORIDE CRYS ER 20 MEQ PO TBCR: 20.0000 meq | EXTENDED_RELEASE_TABLET | Freq: Every day | ORAL | Status: DC | PRN

## 2022-07-23 MED ORDER — MAGNESIUM CITRATE PO SOLN: 1.0000 | Freq: Once | ORAL | Status: DC | PRN

## 2022-07-23 MED ORDER — CHLORHEXIDINE GLUCONATE 0.12 % MT SOLN: 15.0000 mL | Freq: Once | OROMUCOSAL | Status: AC

## 2022-07-23 MED ORDER — HYDRALAZINE HCL 20 MG/ML IJ SOLN: 5.0000 mg | INTRAMUSCULAR | Status: DC | PRN

## 2022-07-23 MED ORDER — ROPIVACAINE HCL 5 MG/ML IJ SOLN
INTRAMUSCULAR | Status: DC | PRN
  Administered 2022-07-23: 30 mL via PERINEURAL
  Administered 2022-07-23: 10 mL via PERINEURAL

## 2022-07-23 MED ORDER — POVIDONE-IODINE 10 % EX SWAB: 2.0000 | Freq: Once | CUTANEOUS | Status: DC

## 2022-07-23 MED ORDER — FENTANYL CITRATE (PF) 100 MCG/2ML IJ SOLN
INTRAMUSCULAR | Status: AC
  Administered 2022-07-23: 50 ug via INTRAVENOUS
  Filled 2022-07-23: qty 2

## 2022-07-23 MED ORDER — ALUM & MAG HYDROXIDE-SIMETH 200-200-20 MG/5ML PO SUSP: 15.0000 mL | ORAL | Status: DC | PRN

## 2022-07-23 MED ORDER — HYDROMORPHONE HCL 1 MG/ML IJ SOLN
0.5000 mg | INTRAMUSCULAR | Status: DC | PRN
  Administered 2022-07-23 – 2022-07-24 (×4): 1 mg via INTRAVENOUS
  Filled 2022-07-23 (×4): qty 1

## 2022-07-23 MED ORDER — CHLORHEXIDINE GLUCONATE CLOTH 2 % EX PADS
6.0000 | MEDICATED_PAD | Freq: Every day | CUTANEOUS | Status: DC
  Administered 2022-07-23 – 2022-07-25 (×3): 6 via TOPICAL

## 2022-07-23 MED ORDER — FENTANYL CITRATE (PF) 250 MCG/5ML IJ SOLN
INTRAMUSCULAR | Status: DC | PRN
  Administered 2022-07-23 (×3): 50 ug via INTRAVENOUS

## 2022-07-23 MED ORDER — FENTANYL CITRATE (PF) 100 MCG/2ML IJ SOLN
INTRAMUSCULAR | Status: AC
  Filled 2022-07-23: qty 2

## 2022-07-23 MED ORDER — CEFAZOLIN SODIUM-DEXTROSE 2-4 GM/100ML-% IV SOLN: 2.0000 g | INTRAVENOUS | Status: DC

## 2022-07-23 MED ORDER — MIDAZOLAM HCL 2 MG/2ML IJ SOLN
INTRAMUSCULAR | Status: AC
  Filled 2022-07-23: qty 2

## 2022-07-23 MED ORDER — OXYCODONE HCL 5 MG PO TABS
10.0000 mg | ORAL_TABLET | ORAL | Status: DC | PRN
  Administered 2022-07-23 – 2022-07-24 (×5): 15 mg via ORAL
  Administered 2022-07-24: 10 mg via ORAL
  Administered 2022-07-25 (×4): 15 mg via ORAL
  Filled 2022-07-23 (×9): qty 3

## 2022-07-23 MED ORDER — METOPROLOL TARTRATE 5 MG/5ML IV SOLN
2.0000 mg | INTRAVENOUS | Status: AC | PRN
  Administered 2022-07-24 (×2): 5 mg via INTRAVENOUS
  Filled 2022-07-23 (×2): qty 5

## 2022-07-23 MED ORDER — ORAL CARE MOUTH RINSE: 15.0000 mL | Freq: Once | OROMUCOSAL | Status: AC

## 2022-07-23 MED ORDER — PROPOFOL 10 MG/ML IV BOLUS
INTRAVENOUS | Status: AC
  Filled 2022-07-23: qty 20

## 2022-07-23 MED ORDER — TRANEXAMIC ACID-NACL 1000-0.7 MG/100ML-% IV SOLN
1000.0000 mg | INTRAVENOUS | Status: AC
  Administered 2022-07-23: 1000 mg via INTRAVENOUS
  Filled 2022-07-23: qty 100

## 2022-07-23 MED ORDER — MIDAZOLAM HCL 2 MG/2ML IJ SOLN
INTRAMUSCULAR | Status: AC
  Administered 2022-07-23: 2 mg via INTRAVENOUS
  Filled 2022-07-23: qty 2

## 2022-07-23 MED ORDER — ONDANSETRON HCL 4 MG/2ML IJ SOLN
INTRAMUSCULAR | Status: DC | PRN
  Administered 2022-07-23: 4 mg via INTRAVENOUS

## 2022-07-23 MED ORDER — POLYETHYLENE GLYCOL 3350 17 G PO PACK: 17.0000 g | PACK | Freq: Every day | ORAL | Status: DC | PRN

## 2022-07-23 MED ORDER — SODIUM CHLORIDE 0.9% IV SOLUTION: Freq: Once | INTRAVENOUS | Status: DC

## 2022-07-23 MED ORDER — VITAMIN C 500 MG PO TABS
1000.0000 mg | ORAL_TABLET | Freq: Every day | ORAL | Status: DC
  Administered 2022-07-23 – 2022-07-25 (×3): 1000 mg via ORAL
  Filled 2022-07-23 (×3): qty 2

## 2022-07-23 MED ORDER — ONDANSETRON HCL 4 MG/2ML IJ SOLN: 4.0000 mg | Freq: Four times a day (QID) | INTRAMUSCULAR | Status: DC | PRN

## 2022-07-23 MED ORDER — FENTANYL CITRATE (PF) 100 MCG/2ML IJ SOLN: 50.0000 ug | Freq: Once | INTRAMUSCULAR | Status: AC

## 2022-07-23 MED ORDER — EZETIMIBE 10 MG PO TABS
10.0000 mg | ORAL_TABLET | Freq: Every day | ORAL | Status: DC
  Administered 2022-07-23 – 2022-07-25 (×3): 10 mg via ORAL
  Filled 2022-07-23 (×3): qty 1

## 2022-07-23 MED ORDER — ACETAMINOPHEN 500 MG PO TABS: 1000.0000 mg | ORAL_TABLET | Freq: Once | ORAL | Status: DC

## 2022-07-23 MED ORDER — ZINC SULFATE 220 (50 ZN) MG PO CAPS
220.0000 mg | ORAL_CAPSULE | Freq: Every day | ORAL | Status: DC
  Administered 2022-07-23 – 2022-07-25 (×3): 220 mg via ORAL
  Filled 2022-07-23 (×3): qty 1

## 2022-07-23 MED ORDER — OXYCODONE HCL 5 MG PO TABS
5.0000 mg | ORAL_TABLET | ORAL | Status: DC | PRN
  Filled 2022-07-23: qty 2

## 2022-07-23 MED ORDER — FENTANYL CITRATE (PF) 100 MCG/2ML IJ SOLN
25.0000 ug | INTRAMUSCULAR | Status: DC | PRN
  Administered 2022-07-23 (×2): 50 ug via INTRAVENOUS

## 2022-07-23 SURGICAL SUPPLY — 41 items
BAG COUNTER SPONGE SURGICOUNT (BAG) IMPLANT
BAG SPNG CNTER NS LX DISP (BAG)
BLADE SAW RECIP 87.9 MT (BLADE) ×1 IMPLANT
BLADE SURG 21 STRL SS (BLADE) ×1 IMPLANT
BNDG COHESIVE 6X5 TAN STRL LF (GAUZE/BANDAGES/DRESSINGS) IMPLANT
CANISTER WOUND CARE 500ML ATS (WOUND CARE) ×1 IMPLANT
CANISTER WOUNDNEG PRESSURE 500 (CANNISTER) IMPLANT
COVER SURGICAL LIGHT HANDLE (MISCELLANEOUS) ×1 IMPLANT
CUFF TOURN SGL QUICK 34 (TOURNIQUET CUFF) ×1
CUFF TRNQT CYL 34X4.125X (TOURNIQUET CUFF) ×1 IMPLANT
DRAPE DERMATAC (DRAPES) IMPLANT
DRAPE INCISE IOBAN 66X45 STRL (DRAPES) ×1 IMPLANT
DRAPE U-SHAPE 47X51 STRL (DRAPES) ×1 IMPLANT
DRESSING PREVENA PLUS CUSTOM (GAUZE/BANDAGES/DRESSINGS) ×1 IMPLANT
DRSG PREVENA PLUS CUSTOM (GAUZE/BANDAGES/DRESSINGS) ×1
DURAPREP 26ML APPLICATOR (WOUND CARE) ×1 IMPLANT
ELECT REM PT RETURN 9FT ADLT (ELECTROSURGICAL) ×1
ELECTRODE REM PT RTRN 9FT ADLT (ELECTROSURGICAL) ×1 IMPLANT
GLOVE BIOGEL PI IND STRL 9 (GLOVE) ×1 IMPLANT
GLOVE SURG ORTHO 9.0 STRL STRW (GLOVE) ×1 IMPLANT
GOWN STRL REUS W/ TWL XL LVL3 (GOWN DISPOSABLE) ×2 IMPLANT
GOWN STRL REUS W/TWL XL LVL3 (GOWN DISPOSABLE) ×2
GRAFT SKIN WND MICRO 38 (Tissue) IMPLANT
GRAFT SKIN WND OMEGA3 SB 7X10 (Tissue) IMPLANT
KIT BASIN OR (CUSTOM PROCEDURE TRAY) ×1 IMPLANT
KIT TURNOVER KIT B (KITS) ×1 IMPLANT
MANIFOLD NEPTUNE II (INSTRUMENTS) ×1 IMPLANT
NS IRRIG 1000ML POUR BTL (IV SOLUTION) ×1 IMPLANT
PACK ORTHO EXTREMITY (CUSTOM PROCEDURE TRAY) ×1 IMPLANT
PAD ARMBOARD 7.5X6 YLW CONV (MISCELLANEOUS) ×1 IMPLANT
PREVENA RESTOR ARTHOFORM 46X30 (CANNISTER) ×1 IMPLANT
SPONGE T-LAP 18X18 ~~LOC~~+RFID (SPONGE) IMPLANT
STAPLER VISISTAT 35W (STAPLE) IMPLANT
STOCKINETTE IMPERVIOUS LG (DRAPES) ×1 IMPLANT
SUT ETHILON 2 0 PSLX (SUTURE) IMPLANT
SUT SILK 2 0 (SUTURE) ×1
SUT SILK 2-0 18XBRD TIE 12 (SUTURE) ×1 IMPLANT
SUT VIC AB 1 CTX 27 (SUTURE) ×2 IMPLANT
TOWEL GREEN STERILE (TOWEL DISPOSABLE) ×1 IMPLANT
TUBE CONNECTING 12X1/4 (SUCTIONS) ×1 IMPLANT
YANKAUER SUCT BULB TIP NO VENT (SUCTIONS) ×1 IMPLANT

## 2022-07-23 NOTE — Consult Note (Signed)
ORTHOPAEDIC CONSULTATION  REQUESTING PHYSICIAN: Geradine Girt, DO  Chief Complaint: Abscess ulceration and osteomyelitis left foot.  HPI: Robert Burnett is a 61 y.o. male who presents with abscess and ulceration left foot.  Patient is status post midfoot amputation 4 months ago.  Patient is also status post left heart catheterization for a heart attack 2 months ago.  Past Medical History:  Diagnosis Date   Allergy    Anemia    Arthritis    ARTHRITIS IN SPINE - MAKES PT HAVE CHEST PAIN- USES fENTANYL PATCH    Asthma    IN TEENS    Bacteremia    a. 03/2022 Group B Strep bacteremia in setting of diabetic L foot infxn/gangrene/osteo.   Diabetes mellitus    Diabetic peripheral neuropathy associated with type 2 diabetes mellitus ([place]) 12/12/2011   Fatty liver disease, nonalcoholic 76/73/4193   Foot osteomyelitis, left ([place])    a. 2012 s/p toe amputations on L; b. 03/2022 - admitted w/ wet gangrene and necrotizing infxn w/ osteomyelitis-->s/p transmetatarsal followed by Lisfranc amputation.   GERD (gastroesophageal reflux disease)    H/O degenerative disc disease    History of echocardiogram    a. 03/2022 Echo: EF 55-60%, no rwma, nl RV fxn, mildly elev PASP. Mild MR.   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[facility])    Past Surgical History:  Procedure Laterality Date   ACHILLES TENDON SURGERY Left 03/28/2022   Procedure: ACHILLES LENGTHENING/KIDNER;  Surgeon: [name], DPM;  Location: [facility];  Service: Podiatry;  Laterality: Left;   AMPUTATION Left 03/25/2022   Procedure: AMPUTATION DIGITS,TRANSMETARSAL AMPUTATION, REMOVAL OF TOES AND BONE BEHIND TOES ;  Surgeon: [name], DPM;  Location: [facility];  Service: Podiatry;  Laterality: Left;   APPLICATION OF WOUND VAC Left 03/25/2022   Procedure: APPLICATION OF WOUND VAC;  Surgeon: [name], DPM;  Location: [facility];  Service: Podiatry;  Laterality: Left;    CORONARY STENT INTERVENTION N/A 06/05/2022   Procedure: CORONARY STENT INTERVENTION;  Surgeon: Belva Crome, MD;  Location: [place] [facility];  Service: Cardiovascular;  Laterality: N/A;   CYSTOSCOPY/RETROGRADE/URETEROSCOPY Left 10/29/2020   Procedure: CYSTOSCOPY/RETROGRADE/ LEFT URETEROSCOPY WITH BIOPSY, LEFT URETERAL STENT;  Surgeon: Raynelle Bring, MD;  Location: [facility];  Service: Urology;  Laterality: Left;   IRRIGATION AND DEBRIDEMENT FOOT Left 03/28/2022   Procedure: AMPUTATION LISFRANC, SECONDARY WOUND CLOSURE;  Surgeon: [name], DPM;  Location: [facility];  Service: Podiatry;  Laterality: Left;   KNEE SURGERY Bilateral 1975 and 1989   LEFT HEART CATH AND CORONARY ANGIOGRAPHY N/A 06/05/2022   Procedure: LEFT HEART CATH AND CORONARY ANGIOGRAPHY;  Surgeon: Belva Crome, MD;  Location: [place] [facility];  Service: Cardiovascular;  Laterality: N/A;   MULTIPLE TOOTH EXTRACTIONS     ROBOT ASSITED LAPAROSCOPIC NEPHROURETERECTOMY Left 12/13/2020   Procedure: XI ROBOT ASSITED LAPAROSCOPIC NEPHROURETERECTOMY , POST OPERATIVE INTRAVESICAL GEMCITABINE;  Surgeon: Raynelle Bring, MD;  Location: [facility];  Service: Urology;  Laterality: Left;  ONLY NEEDS 240 MIN   TOEM AMPUTATION  Left    2012, second toe   TRANSURETHRAL RESECTION OF BLADDER TUMOR N/A 08/29/2021   Procedure: TRANSURETHRAL RESECTION OF BLADDER TUMOR (TURBT) WITH CYSTOSCOPY;  Surgeon: Raynelle Bring, MD;  Location: [facility];  Service: Urology;  Laterality: N/A;  GENERAL ANESTHESIA WITH PARALYSIS   WISDOM TOOTH EXTRACTION  Social History   Socioeconomic History   Marital status: Single    Spouse name: Not on file   Number of children: Not on file   Years of education: Not on file   Highest education level: Not on file  Occupational History   Not on file  Tobacco Use   Smoking status: Every Day    Packs/day: 2.00    Years: 5.00    Total pack years: 10.00    Types: Pipe, Cigarettes   Smokeless tobacco: Former    Types:  Chew   Tobacco comments:    Using pipe until about a week ago (05/2022)  Vaping Use   Vaping Use: Never used  Substance and Sexual Activity   Alcohol use: No   Drug use: No   Sexual activity: Not on file  Other Topics Concern   Not on file  Social History Narrative   Lives in [place] by himself.  Sedentary in the setting of chronic arthritic pain impacting his chest/back along w/ L foot amputation.   Social Determinants of Health   Financial Resource Strain: Not on file  Food Insecurity: Not on file  Transportation Needs: Not on file  Physical Activity: Not on file  Stress: Not on file  Social Connections: Not on file   Family History  Problem Relation Age of Onset   Cancer Mother    Hypertension Father    Diabetes Father    Lung cancer Father    Diabetes Sister    Cancer Sister    - negative except otherwise stated in the family history section Allergies  Allergen Reactions   Amlodipine Rash   Prozac [Fluoxetine Hcl] Rash   Prior to Admission medications   Medication Sig Start Date End Date Taking? Authorizing Provider  antiseptic oral rinse (BIOTENE) LIQD 15 mLs by Mouth Rinse route as needed for dry mouth.   Yes [provider]  apixaban (ELIQUIS) 5 MG TABS tablet Take 1 tablet (5 mg total) by mouth 2 (two) times daily. 06/06/22  Yes Florencia Reasons, MD  aspirin EC 81 MG tablet Take 1 tablet (81 mg total) by mouth daily. Swallow whole. For 30 days then stop 06/06/22 06/06/23 Yes Florencia Reasons, MD  atorvastatin (LIPITOR) 80 MG tablet Take 80 mg by mouth daily. 04/25/21  Yes [provider]  clopidogrel (PLAVIX) 75 MG tablet Take 1 tablet (75 mg total) by mouth daily. 06/07/22  Yes Florencia Reasons, MD  dapagliflozin propanediol (FARXIGA) 10 MG TABS tablet Take 1 tablet (10 mg total) by mouth daily before breakfast. 06/26/22  Yes Agbor-Etang, Aaron Edelman, MD  Diclofenac Sodium (VOLTAREN ARTHRITIS PAIN EX) Apply 1 application  topically daily as needed (pain relief).   Yes [provider]  ezetimibe (ZETIA) 10 MG tablet Take 1 tablet (10 mg total) by mouth daily. 06/07/22  Yes Florencia Reasons, MD  fentaNYL (DURAGESIC) 75 MCG/HR Place 1 patch onto the skin every 3 (three) days. 06/06/22  Yes Florencia Reasons, MD  finasteride (PROSCAR) 5 MG tablet Take 5 mg by mouth daily.   Yes [provider]  furosemide (LASIX) 40 MG tablet Take 1 tablet (40 mg total) by mouth daily as needed for edema or fluid (Take 1 tablet if your weight increases more than 2 pounds in 24 hours.). 06/08/22 06/08/23 Yes Regalado, Belkys A, MD  insulin NPH-regular Human (70-30) 100 UNIT/ML injection Inject 10 Units into the skin 2 (two) times daily with a meal. RELION BRAND PLEASE 06/08/22  Yes Regalado, Belkys A,  MD  metoprolol succinate (TOPROL-XL) 25 MG 24 hr tablet Take 3 tablets (75 mg total) by mouth daily. Patient taking differently: Take 25 mg by mouth 3 (three) times daily. 06/07/22  Yes Florencia Reasons, MD  nitroGLYCERIN (NITROSTAT) 0.4 MG SL tablet Place 1 tablet (0.4 mg total) under the tongue every 5 (five) minutes as needed for chest pain. 06/08/22 06/08/23 Yes Regalado, Belkys A, MD  pantoprazole (PROTONIX) 40 MG tablet Take 1 tablet (40 mg total) by mouth daily. 06/06/22  Yes Florencia Reasons, MD  polyethylene glycol (MIRALAX / GLYCOLAX) 17 g packet Take 17 g by mouth 2 (two) times daily. 06/08/22  Yes Regalado, Belkys A, MD  potassium chloride (KLOR-CON M) 10 MEQ tablet Take 2 tablets (20 mEq total) by mouth daily as needed (Take the days  you need to  take lasix.). 06/08/22  Yes Regalado, Belkys A, MD  FREESTYLE LITE test strip 1 each by Other route 3 (three) times daily. 04/30/22   Charlott Rakes, MD  Insulin Pen Needle (PEN NEEDLES 3/16") 31G X 5 MM MISC 28 Units by Does not apply route 2 (two) times daily. 04/04/22   Georgette Shell, MD  Insulin Syringe-Needle U-100 (RELION INSULIN SYRINGE 1ML/31G) 31G X 5/16" 1 ML MISC 1 each by Does not apply route in the morning and at bedtime. Use to inject Insulin 70/30  06/08/22   Regalado, Belkys A, MD  Lancets (FREESTYLE) lancets 1 each by Other route 3 (three) times daily. 04/30/22   Charlott Rakes, MD  losartan (COZAAR) 50 MG tablet Take 1 tablet (50 mg total) by mouth daily. 06/27/22 09/26/23  Kate Sable, MD   MR FOOT LEFT WO CONTRAST  Result Date: 07/22/2022 CLINICAL DATA:  Left foot ulcer and infection.  Prior amputation. EXAM: MRI OF THE LEFT FOOT WITHOUT CONTRAST TECHNIQUE: Multiplanar, multisequence MR imaging of the left foot was performed. No intravenous contrast was administered. COMPARISON:  Left foot x-rays from yesterday. MRI left foot dated March 25, 2022. FINDINGS: Bones/Joint/Cartilage Abnormal marrow edema involving the cuneiforms, navicular, and cuboid. Periarticular marrow edema involving the tibiotalar and posterior subtalar joints. Bony destruction of the medial and middle cuneiforms as well as the navicular. No fracture or dislocation. Small tibiotalar joint effusion. Ligaments Medial and lateral ankle ligaments are intact. Muscles and Tendons High-grade partial tear of the anterior tibialis tendon. Achilles tendinosis. Soft tissue Deep soft tissue ulceration at the plantar aspect of the medial cuneiform, extending to bone. Large multiloculated fluid collection in the posterior hindfoot between the Achilles and distal tibia, measuring 9.3 x 4.6 x 10.8 cm. The fluid collection may communicate with the posterior ankle joint (series 3, image 17 and/or the flexor hallucis longus tendon sheath. There are few foci of air within the fluid collection. The fluid collection significantly displaces the posterior tibial neurovascular bundle posteriorly and medially. Diffuse circumferential soft tissue swelling and skin thickening. IMPRESSION: 1. Deep soft tissue ulceration at the plantar aspect of the medial cuneiform, extending to bone, with underlying osteomyelitis of the cuneiforms, cuboid, and navicular. 2. Large 10.8 cm multiloculated abscess in the  posterior hindfoot between the Achilles and distal tibia. The abscess may communicate with the posterior ankle joint and/or the flexor hallucis longus tendon sheath. 3. Periarticular marrow edema involving the tibiotalar and posterior subtalar joints, with small tibiotalar joint effusion, concerning for septic arthritis and osteomyelitis. Electronically Signed   By: Titus Dubin M.D.   On: 07/22/2022 08:49   DG Foot Complete Left  Result Date: 07/21/2022 CLINICAL  DATA:  Foul-smelling wound EXAM: LEFT FOOT - COMPLETE 3+ VIEW COMPARISON:  06/04/2022 FINDINGS: Transmetatarsal amputation is again identified and stable. Some generalized soft tissue swelling is noted in the foot ankle new from the prior exam. Increased lucency is noted in the residual first metatarsal and first cuneiform best noted on the frontal and lateral projections. These changes are consistent with progressive osteomyelitis. Soft tissue wound is noted along the plantar aspect consistent with the given clinical history. IMPRESSION: Increased lucency in the first metatarsal remnant and first cuneiform consistent with progressive osteomyelitis. Electronically Signed   By: Inez Catalina M.D.   On: 07/21/2022 21:33   - pertinent xrays, CT, MRI studies were reviewed and independently interpreted  Positive ROS: All other systems have been reviewed and were otherwise negative with the exception of those mentioned in the HPI and as above.  Physical Exam: General: Alert, no acute distress Psychiatric: Patient is competent for consent with normal mood and affect Lymphatic: No axillary or cervical lymphadenopathy Cardiovascular: No pedal edema Respiratory: No cyanosis, no use of accessory musculature GI: No organomegaly, abdomen is soft and non-tender    Images:  '@ENCIMAGES'$ @  Labs:  Lab Results  Component Value Date   HGBA1C 6.8 (H) 07/22/2022   HGBA1C 14.2 (H) 03/25/2022   HGBA1C 11.2 (H) 08/23/2021   ESRSEDRATE 124 (H)  07/22/2022   ESRSEDRATE 115 (H) 03/25/2022   ESRSEDRATE 4 06/01/2014   CRP 26.5 (H) 07/22/2022   CRP 21.4 (H) 03/25/2022   CRP <0.5 06/01/2014   REPTSTATUS 04/01/2022 FINAL 03/27/2022   REPTSTATUS 04/01/2022 FINAL 03/27/2022   GRAMSTAIN  03/25/2022    NO SQUAMOUS EPITHELIAL CELLS SEEN FEW WBC SEEN FEW GRAM POSITIVE RODS FEW GRAM NEGATIVE RODS FEW GRAM POSITIVE COCCI Performed at [facility], [place] [address], [place], [place] [address]    CULT  03/27/2022    NO GROWTH 5 DAYS Performed at [facility] [address], [place], [place] [address]    CULT  03/27/2022    NO GROWTH 5 DAYS Performed at [facility] [address] [place], [place] [address]    LABORGA PROTEUS VULGARIS 03/25/2022    Lab Results  Component Value Date   ALBUMIN 2.3 (L) 07/21/2022   ALBUMIN 2.3 (L) 03/25/2022   ALBUMIN 2.7 (L) 03/25/2022   PREALBUMIN <5 (L) 03/25/2022        Latest Ref Rng & Units 07/23/2022    6:17 AM 07/22/2022    8:30 AM 07/21/2022    8:55 PM  CBC EXTENDED  WBC 4.0 - 10.5 K/uL 7.4  11.8  13.9   RBC 4.22 - 5.81 MIL/uL 2.84  3.79  3.39   Hemoglobin 13.0 - 17.0 g/dL 7.5  9.9  8.8   HCT 39.0 - 52.0 % 22.7  30.5  28.0   Platelets 150 - 400 K/uL 158  281  247   NEUT# 1.7 - 7.7 K/uL   12.5   Lymph# 0.7 - 4.0 K/uL   0.6     Neurologic: Patient does not have protective sensation bilateral lower extremities.   MUSCULOSKELETAL:   Skin: Examination patient has swelling cellulitis and purulent drainage from the left foot and ankle.  There is significant swelling and fluctuations posteriorly to the ankle.  Patient's ankle-brachial indices 4 months ago showed triphasic flow in the right lower extremity and biphasic flow on the left lower extremity with adequate circulation.  Review of the MRI scan shows a large abscess posterior to  the ankle and distal tibia with osteomyelitis of the midfoot.  Lab work shows a white cell count that is improved from 13.9-7.4.   Hemoglobin has dropped to 7.5 from 9.9.  Albumin 2.3.  Hemoglobin A1c has been as high as 14.4.  The last prealbumin was less than 5.  Assessment: Assessment: Abscess and osteomyelitis left foot status post left foot surgery with uncontrolled type 2 diabetes anemia of chronic disease and severe protein caloric malnutrition.  Plan:  We will plan for a left transtibial amputation today.  Risks and benefits were discussed including risk of the wound not healing, need for additional surgery.  Patient states he understands wished to proceed at this time.  Will type and cross and transfuse with 2 units of packed red blood cells today.  Thank you for the consult and the opportunity to see Mr. Robert Burnett, [place] [phone] 7:24 AM

## 2022-07-23 NOTE — Progress Notes (Signed)
PROGRESS NOTE    Robert Burnett  HDQ:[id number] DOB: 1961-03-27 DOA: 07/21/2022 PCP: Charlott Rakes, MD    Brief Narrative:   HPI: Robert Burnett is a 61 y.o. male with medical history significant of  HTN, T2DM, fatty liver, HLD, hx of urothelial cancer, diabetic neuropathy who presented to ED with complaints of worsening left foot pain, foul drainage and swelling x 1 week. He was admitted in 03/2022 for diabetic foot infection with wet gangrene and necrotizing infection and osteomyelitis. S/p transmetatarsal amputation of the left foot with I&D by podiatry on 03/25/22 then OR again on 6/9 with Lisfranc amputation. ABI normal.   Plan for BKA on 10/4  Assessment and Plan: * Foot osteomyelitis, left ([place]) 61 year old with known left foot diabetic ulcer presenting with worsening pain, foul smelling drainage and swelling x 1 week found to have  Osteomyelitis, 10.8cm multiloculated abscess in posterior hindfoot and concner for septic arthritis vs. OM of tibiotalar and posterior subtalar joints off MRI -podiatry initially consulted and recommended ortho.  -Dr. Sharol Given to do BKA 10/4 -continue vanc/zosyn -pain medication with oxycodone and morphine for severe pain -on plavix/eliquis for recent DES to LAD in 05/2022 and PAF. Last took yesterday. Dr. Sharol Given okay to continue with plavix.  -ABI normal in 03/2022  Type 2 diabetes mellitus with complication, with long-term current use of insulin (HCC) Poorly controlled A1C in 03/2022 was 14.2 now 6.8 -adjust medications for better control after surgery SSI and accucheck qac/hs Continue farxiga   Hyponatremia Improving, daily labs  CAD S/P percutaneous coronary angioplasty NSTEMI CAD s/p DES to proximal LAD 06/05/2022 Was on triple therapy with ASA, plavix and eliquis x1 month and has been continued on eliquis/plavix  Hold due to BKA plans for tomorrow, but will need to start back at least plavix with history of recent DES less than 3 months ago.  Dr.  Sharol Given: okay for plavix  Continue statin, toprol  Ischemic cardiomyopathy Euvolemic to dry Echo 05/2022: EF of 40-45%  Strict I/O Continue toprol and farxiga Cardiology considering entresto if renal function allows   PAF (paroxysmal atrial fibrillation) ([place]) New diagnosis from hospitalization in august for NSTEMI ekg pending, NSR on exam Telemetry Hold eliquis with surgery and continue toprol    Hypertension Continue metoprolol '25mg'$  TID   Normocytic anemia Baseline 9-10 -getting 2 units PRBC with surgery   Hyperlipidemia Continue lipitor '80mg'$  and zetia '10mg'$  daily   CKD (chronic kidney disease) stage 2, GFR 60-89 ml/min Stable, continue to monitor with vanc/zosyn   Tobacco abuse Smokes a pipe daily- nicotine patch    DVT prophylaxis: SCDs Start: 07/22/22 1344    Code Status: Full Code   Disposition Plan:  Level of care: Telemetry Medical Status is: Inpatient Remains inpatient appropriate because: needs amputation    Consultants:  Podiatry Ortho Sharol Given)  Subjective: sleeping  Objective: Vitals:   07/23/22 0130 07/23/22 0300 07/23/22 0336 07/23/22 0752  BP: 129/65 109/61 110/70 (!) 145/63  Pulse: 84 86  87  Resp: '17 18 18 18  '$ Temp:   99 F (37.2 C) 99.8 F (37.7 C)  TempSrc:   Oral Oral  SpO2: 100% (!) 88% 100% 100%  Weight:      Height:        Intake/Output Summary (Last 24 hours) at 07/23/2022 0929 Last data filed at 07/23/2022 0915 Gross per 24 hour  Intake 835.88 ml  Output 350 ml  Net 485.88 ml   Filed Weights   07/21/22 2113  Weight: 81.2 kg    Examination:   General: Appearance:    Well developed, well nourished male in no acute distress     Lungs:     Clear to auscultation bilaterally, respirations unlabored  Heart:    Normal heart rate.       Neurologic:  sleeping       Data Reviewed: I have personally reviewed following labs and imaging studies  CBC: Recent Labs  Lab 07/21/22 2055 07/22/22 0830 07/23/22 0617  WBC  13.9* 11.8* 7.4  NEUTROABS 12.5*  --   --   HGB 8.8* 9.9* 7.5*  HCT 28.0* 30.5* 22.7*  MCV 82.6 80.5 79.9*  PLT 247 281 509   Basic Metabolic Panel: Recent Labs  Lab 07/21/22 2055 07/22/22 0830 07/23/22 0617  NA 129* 131* 130*  K 3.8 4.1 4.0  CL 95* 93* 95*  CO2 '22 23 23  '$ GLUCOSE 171* 157* 121*  BUN '15 22 20  '$ CREATININE 1.25* 1.68* 1.32*  CALCIUM 8.2* 9.0 8.5*   GFR: Estimated Creatinine Clearance: 65.6 mL/min (A) (by C-G formula based on SCr of 1.32 mg/dL (H)). Liver Function Tests: Recent Labs  Lab 07/21/22 2055  AST 16  ALT 13  ALKPHOS 93  BILITOT 0.9  PROT 8.0  ALBUMIN 2.3*   No results for input(s): "LIPASE", "AMYLASE" in the last 168 hours. No results for input(s): "AMMONIA" in the last 168 hours. Coagulation Profile: No results for input(s): "INR", "PROTIME" in the last 168 hours. Cardiac Enzymes: No results for input(s): "CKTOTAL", "CKMB", "CKMBINDEX", "TROPONINI" in the last 168 hours. BNP (last 3 results) No results for input(s): "PROBNP" in the last 8760 hours. HbA1C: Recent Labs    07/22/22 0830  HGBA1C 6.8*   CBG: Recent Labs  Lab 07/22/22 1633 07/23/22 0850  GLUCAP 177* 114*   Lipid Profile: No results for input(s): "CHOL", "HDL", "LDLCALC", "TRIG", "CHOLHDL", "LDLDIRECT" in the last 72 hours. Thyroid Function Tests: No results for input(s): "TSH", "T4TOTAL", "FREET4", "T3FREE", "THYROIDAB" in the last 72 hours. Anemia Panel: Recent Labs    07/22/22 0830  FERRITIN 161  TIBC 301  IRON 25*   Sepsis Labs: Recent Labs  Lab 07/21/22 2055  LATICACIDVEN 1.4    Recent Results (from the past 240 hour(s))  Blood culture (routine x 2)     Status: None (Preliminary result)   Collection Time: 07/22/22  8:04 AM   Specimen: BLOOD LEFT ARM  Result Value Ref Range Status   Specimen Description BLOOD LEFT ARM  Final   Special Requests   Final    BOTTLES DRAWN AEROBIC AND ANAEROBIC Blood Culture results may not be optimal due to an  inadequate volume of blood received in culture bottles   Culture   Final    NO GROWTH < 24 HOURS Performed at [facility], [place] [address] [place], [place] [address]    Report Status PENDING  Incomplete  Blood culture (routine x 2)     Status: None (Preliminary result)   Collection Time: 07/22/22  8:09 AM   Specimen: BLOOD LEFT HAND  Result Value Ref Range Status   Specimen Description BLOOD LEFT HAND  Final   Special Requests   Final    BOTTLES DRAWN AEROBIC AND ANAEROBIC Blood Culture results may not be optimal due to an inadequate volume of blood received in culture bottles   Culture   Final    NO GROWTH < 24 HOURS Performed at [facility], [place] [address]  [place], [place] [address]    Report Status PENDING  Incomplete  Surgical pcr screen     Status: Abnormal   Collection Time: 07/23/22  3:45 AM   Specimen: Nasal Mucosa; Nasal Swab  Result Value Ref Range Status   MRSA, PCR NEGATIVE NEGATIVE Final   Staphylococcus aureus POSITIVE (A) NEGATIVE Final    Comment: (NOTE) The Xpert SA Assay (FDA approved for NASAL specimens in patients 36 years of age and older), is one component of a comprehensive surveillance program. It is not intended to diagnose infection nor to guide or monitor treatment. Performed at [facility], [place] [address], [place], [place] [address]          Radiology Studies: MR FOOT LEFT WO CONTRAST  Result Date: 07/22/2022 CLINICAL DATA:  Left foot ulcer and infection.  Prior amputation. EXAM: MRI OF THE LEFT FOOT WITHOUT CONTRAST TECHNIQUE: Multiplanar, multisequence MR imaging of the left foot was performed. No intravenous contrast was administered. COMPARISON:  Left foot x-rays from yesterday. MRI left foot dated March 25, 2022. FINDINGS: Bones/Joint/Cartilage Abnormal marrow edema involving the cuneiforms, navicular, and cuboid. Periarticular marrow edema involving the tibiotalar and posterior subtalar joints. Bony destruction of the  medial and middle cuneiforms as well as the navicular. No fracture or dislocation. Small tibiotalar joint effusion. Ligaments Medial and lateral ankle ligaments are intact. Muscles and Tendons High-grade partial tear of the anterior tibialis tendon. Achilles tendinosis. Soft tissue Deep soft tissue ulceration at the plantar aspect of the medial cuneiform, extending to bone. Large multiloculated fluid collection in the posterior hindfoot between the Achilles and distal tibia, measuring 9.3 x 4.6 x 10.8 cm. The fluid collection may communicate with the posterior ankle joint (series 3, image 17 and/or the flexor hallucis longus tendon sheath. There are few foci of air within the fluid collection. The fluid collection significantly displaces the posterior tibial neurovascular bundle posteriorly and medially. Diffuse circumferential soft tissue swelling and skin thickening. IMPRESSION: 1. Deep soft tissue ulceration at the plantar aspect of the medial cuneiform, extending to bone, with underlying osteomyelitis of the cuneiforms, cuboid, and navicular. 2. Large 10.8 cm multiloculated abscess in the posterior hindfoot between the Achilles and distal tibia. The abscess may communicate with the posterior ankle joint and/or the flexor hallucis longus tendon sheath. 3. Periarticular marrow edema involving the tibiotalar and posterior subtalar joints, with small tibiotalar joint effusion, concerning for septic arthritis and osteomyelitis. Electronically Signed   By: Titus Dubin M.D.   On: 07/22/2022 08:49   DG Foot Complete Left  Result Date: 07/21/2022 CLINICAL DATA:  Foul-smelling wound EXAM: LEFT FOOT - COMPLETE 3+ VIEW COMPARISON:  06/04/2022 FINDINGS: Transmetatarsal amputation is again identified and stable. Some generalized soft tissue swelling is noted in the foot ankle new from the prior exam. Increased lucency is noted in the residual first metatarsal and first cuneiform best noted on the frontal and lateral  projections. These changes are consistent with progressive osteomyelitis. Soft tissue wound is noted along the plantar aspect consistent with the given clinical history. IMPRESSION: Increased lucency in the first metatarsal remnant and first cuneiform consistent with progressive osteomyelitis. Electronically Signed   By: Inez Catalina M.D.   On: 07/21/2022 21:33        Scheduled Meds:  sodium chloride   Intravenous Once   atorvastatin  80 mg Oral Daily   Chlorhexidine Gluconate Cloth  6 each Topical Q0600   clopidogrel  75 mg Oral Daily   dapagliflozin propanediol  10 mg  Oral QAC breakfast   ezetimibe  10 mg Oral Daily   insulin aspart  0-9 Units Subcutaneous TID WC   insulin detemir  8 Units Subcutaneous QHS   metoprolol succinate  25 mg Oral TID   mupirocin ointment  1 Application Nasal BID   nicotine  7 mg Transdermal Daily   pantoprazole  40 mg Oral Daily   Continuous Infusions:  piperacillin-tazobactam (ZOSYN)  IV 3.375 g (07/23/22 0521)   vancomycin       LOS: 1 day    Time spent: 45 minutes spent on chart review, discussion with nursing staff, consultants, updating family and interview/physical exam; more than 50% of that time was spent in counseling and/or coordination of care.    Geradine Girt, DO [facility] Available via Epic secure chat 7am-7pm After these hours, please refer to coverage provider listed on [email or web address] 07/23/2022, 9:29 AM

## 2022-07-23 NOTE — Op Note (Signed)
07/23/2022  1:56 PM  PATIENT:  Robert Burnett    PRE-OPERATIVE DIAGNOSIS:  Osteomyelitis Left Foot  POST-OPERATIVE DIAGNOSIS:  Same  PROCEDURE:  LEFT BELOW KNEE AMPUTATION Application of Kerecis micro powder 38 cm and application Kerecis sheet 7 x 10 cm to cover a wound surface area 200 cm. Application of Prevena customizable and Arnell Sieving form wound VAC.  SURGEON:  [name], MD  ANESTHESIA:   General  PREOPERATIVE INDICATIONS:  Robert Burnett is a  61 y.o. male with a diagnosis of Osteomyelitis Left Foot who failed conservative measures and elected for surgical management.    The risks benefits and alternatives were discussed with the patient preoperatively including but not limited to the risks of infection, bleeding, nerve injury, cardiopulmonary complications, the need for revision surgery, among others, and the patient was willing to proceed.  OPERATIVE IMPLANTS: Kerecis micro graft 38 cm and Kerecis sheet 7 x 10 cm.   OPERATIVE FINDINGS: Patient had no signs of abscess at the level of amputation.  Patient did have ischemic muscles involving the medial gastrocnemius muscle and soleus muscle.  OPERATIVE PROCEDURE: Patient was brought to the operating room after undergoing a regional anesthetic.  After adequate levels anesthesia were obtained a thigh tourniquet was placed and the lower extremity was prepped using DuraPrep draped into a sterile field. The foot was draped out of the sterile field with impervious stockinette.  A timeout was called and the tourniquet inflated.  A transverse skin incision was made 12 cm distal to the tibial tubercle, the incision curved proximally, and a large posterior flap was created.  The tibia was transected just proximal to the skin incision and beveled anteriorly.  The fibula was transected just proximal to the tibial incision.  The sciatic nerve was pulled cut and allowed to retract.  The vascular bundles were suture ligated with 2-0 silk.   The tourniquet was deflated and hemostasis obtained.    Drill holes were placed through the tibia and fibula to secure the [facility] tissue graft and the gastrocnemius fascia.    The Kerecis micro powder 38 cm was applied to the open wound that has a 200 cm surface area.  The 7 x 10 cm Kerecis sheet was then folded and secured to the distal tibia and fibula with #1 Vicryl.  A separate drill hole was then used to secure the [facility] tissue graft and the gastrocnemius fascia to the dorsum of the tibia.    The deep and superficial fascial layers were closed using #1 Vicryl.  The skin was closed using staples.    The Prevena customizable dressing was applied this was overwrapped with the arthroform sponge.  [name] was used to secure the sponges and the circumferential compression was secured to the skin with Dermatac.  This was connected to the wound VAC pump and had a good suction fit this was covered with a stump shrinker and a limb protector.  Patient was taken to the PACU in stable condition.   DISCHARGE PLANNING:  Antibiotic duration: 24-hour antibiotics  Weightbearing: Nonweightbearing on the operative extremity  Pain medication: Opioid pathway  Dressing care/ Wound VAC: Continue wound VAC with the Prevena plus pump at discharge for 1 week  Ambulatory devices: Walker or kneeling scooter  Discharge to: Discharge planning based on recommendations per physical therapy  Follow-up: In the office 1 week after discharge.

## 2022-07-23 NOTE — Anesthesia Procedure Notes (Addendum)
Procedure Name: MAC Date/Time: 07/23/2022 1:04 PM  Performed by: Darletta Moll, CRNAPre-anesthesia Checklist: Patient identified, Emergency Drugs available, Suction available and Patient being monitored Patient Re-evaluated:Patient Re-evaluated prior to induction Oxygen Delivery Method: Nasal cannula Placement Confirmation: positive ETCO2

## 2022-07-23 NOTE — Transfer of Care (Signed)
Immediate Anesthesia Transfer of Care Note  Patient: BRICYN LABRADA  Procedure(s) Performed: LEFT BELOW KNEE AMPUTATION (Left: Knee)  Patient Location: PACU  Anesthesia Type:MAC and Regional  Level of Consciousness: drowsy and patient cooperative  Airway & Oxygen Therapy: Patient Spontanous Breathing and Patient connected to nasal cannula oxygen  Post-op Assessment: Report given to RN, Post -op Vital signs reviewed and stable and Patient moving all extremities X 4  Post vital signs: Reviewed and stable  Last Vitals:  Vitals Value Taken Time  BP 120/77 07/23/22 1352  Temp    Pulse 92 07/23/22 1355  Resp 18 07/23/22 1356  SpO2 96 % 07/23/22 1355  Vitals shown include unvalidated device data.  Last Pain:  Vitals:   07/23/22 1250  TempSrc: Oral  PainSc:          Complications: No notable events documented.

## 2022-07-23 NOTE — Progress Notes (Signed)
Inpatient Rehabilitation Admissions Coordinator   Rehab consult received . I await therapy evals to assist with planning most appropriate rehab venue.  Danne Baxter, RN, MSN Rehab Admissions Coordinator [phone] 07/23/2022 3:08 PM

## 2022-07-23 NOTE — ED Notes (Signed)
Attempted to call report and advised the nurse would call back

## 2022-07-23 NOTE — Anesthesia Preprocedure Evaluation (Signed)
Anesthesia Evaluation  Patient identified by MRN, date of birth, ID band Patient awake    Reviewed: Allergy & Precautions, NPO status , Patient's Chart, lab work & pertinent test results, reviewed documented beta blocker date and time   Airway Mallampati: II  TM Distance: >3 FB Neck ROM: Full    Dental  (+) Dental Advisory Given, Missing, Poor Dentition   Pulmonary asthma , Current Smoker and Patient abstained from smoking.,    Pulmonary exam normal breath sounds clear to auscultation       Cardiovascular hypertension, Pt. on home beta blockers and Pt. on medications + CAD, + Past MI and + Cardiac Stents  Normal cardiovascular exam Rhythm:Regular Rate:Normal     Neuro/Psych  Neuromuscular disease    GI/Hepatic Neg liver ROS, GERD  Medicated,  Endo/Other  diabetes, Type 2, Oral Hypoglycemic Agents, Insulin Dependent  Renal/GU Renal InsufficiencyRenal disease     Musculoskeletal  (+) Arthritis , Osteomyelitis Left Foot   Abdominal   Peds  Hematology  (+) Blood dyscrasia (Eliquis), ,   Anesthesia Other Findings Day of surgery medications reviewed with the patient.  Reproductive/Obstetrics                             Anesthesia Physical Anesthesia Plan  ASA: 4  Anesthesia Plan: MAC and Regional   Post-op Pain Management: Regional block* and Tylenol PO (pre-op)*   Induction: Intravenous  PONV Risk Score and Plan: 0 and TIVA, Treatment may vary due to age or medical condition and Ondansetron  Airway Management Planned: Natural Airway and Simple Face Mask  Additional Equipment:   Intra-op Plan:   Post-operative Plan:   Informed Consent: I have reviewed the patients History and Physical, chart, labs and discussed the procedure including the risks, benefits and alternatives for the proposed anesthesia with the patient or authorized representative who has indicated his/her understanding  and acceptance.     Dental advisory given  Plan Discussed with: CRNA  Anesthesia Plan Comments:         Anesthesia Quick Evaluation

## 2022-07-23 NOTE — Anesthesia Procedure Notes (Signed)
Anesthesia Regional Block: Popliteal block   Pre-Anesthetic Checklist: , timeout performed,  Correct Patient, Correct Site, Correct Laterality,  Correct Procedure, Correct Position, site marked,  Risks and benefits discussed,  Surgical consent,  Pre-op evaluation,  At surgeon's request and post-op pain management  Laterality: Left  Prep: chloraprep       Needles:  Injection technique: Single-shot  Needle Type: Echogenic Needle     Needle Length: 9cm  Needle Gauge: 21     Additional Needles:   Procedures:,,,, ultrasound used (permanent image in chart),,    Narrative:  Start time: 07/23/2022 12:25 PM End time: 07/23/2022 12:31 PM Injection made incrementally with aspirations every 5 mL.  Performed by: Personally  Anesthesiologist: Santa Lighter, MD  Additional Notes: No pain on injection. No increased resistance to injection. Injection made in 5cc increments.  Good needle visualization.  Patient tolerated procedure well.

## 2022-07-23 NOTE — Anesthesia Procedure Notes (Signed)
Anesthesia Regional Block: Adductor canal block   Pre-Anesthetic Checklist: , timeout performed,  Correct Patient, Correct Site, Correct Laterality,  Correct Procedure, Correct Position, site marked,  Risks and benefits discussed,  Surgical consent,  Pre-op evaluation,  At surgeon's request and post-op pain management  Laterality: Left  Prep: chloraprep       Needles:  Injection technique: Single-shot  Needle Type: Echogenic Needle     Needle Length: 9cm  Needle Gauge: 21     Additional Needles:   Procedures:,,,, ultrasound used (permanent image in chart),,    Narrative:  Start time: 07/23/2022 12:31 PM End time: 07/23/2022 12:37 PM Injection made incrementally with aspirations every 5 mL.  Performed by: Personally  Anesthesiologist: Santa Lighter, MD  Additional Notes: No pain on injection. No increased resistance to injection. Injection made in 5cc increments.  Good needle visualization.  Patient tolerated procedure well.

## 2022-07-23 NOTE — Progress Notes (Signed)
Second unit of PRBC started at 1250. Monitored patient at bedside until he was transported to OR at 1300. Unable to obtain 15 minute post blood start vitals.

## 2022-07-24 ENCOUNTER — Encounter (HOSPITAL_COMMUNITY): Payer: Self-pay | Admitting: Orthopedic Surgery

## 2022-07-24 DIAGNOSIS — L0291 Cutaneous abscess, unspecified: Secondary | ICD-10-CM | POA: Diagnosis not present

## 2022-07-24 DIAGNOSIS — E1169 Type 2 diabetes mellitus with other specified complication: Secondary | ICD-10-CM | POA: Diagnosis not present

## 2022-07-24 DIAGNOSIS — E871 Hypo-osmolality and hyponatremia: Secondary | ICD-10-CM | POA: Diagnosis not present

## 2022-07-24 DIAGNOSIS — S88112A Complete traumatic amputation at level between knee and ankle, left lower leg, initial encounter: Secondary | ICD-10-CM | POA: Diagnosis not present

## 2022-07-24 LAB — TYPE AND SCREEN
ABO/RH(D): AB POS
Antibody Screen: NEGATIVE
Unit division: 0
Unit division: 0

## 2022-07-24 LAB — BPAM RBC
Blood Product Expiration Date: 202310202359
Blood Product Expiration Date: 202310262359
ISSUE DATE / TIME: 202310041019
ISSUE DATE / TIME: 202310041242
Unit Type and Rh: 6200
Unit Type and Rh: 6200

## 2022-07-24 LAB — GLUCOSE, CAPILLARY
Glucose-Capillary: 142 mg/dL — ABNORMAL HIGH (ref 70–99)
Glucose-Capillary: 154 mg/dL — ABNORMAL HIGH (ref 70–99)
Glucose-Capillary: 139 mg/dL — ABNORMAL HIGH (ref 70–99)
Glucose-Capillary: 118 mg/dL — ABNORMAL HIGH (ref 70–99)

## 2022-07-24 LAB — CBC
HCT: 26.9 % — ABNORMAL LOW (ref 39.0–52.0)
Hemoglobin: 9.1 g/dL — ABNORMAL LOW (ref 13.0–17.0)
MCH: 27.1 pg (ref 26.0–34.0)
MCHC: 33.8 g/dL (ref 30.0–36.0)
MCV: 80.1 fL (ref 80.0–100.0)
Platelets: 211 10*3/uL (ref 150–400)
RBC: 3.36 MIL/uL — ABNORMAL LOW (ref 4.22–5.81)
RDW: 14.6 % (ref 11.5–15.5)
WBC: 8.1 10*3/uL (ref 4.0–10.5)
nRBC: 0 % (ref 0.0–0.2)

## 2022-07-24 LAB — BASIC METABOLIC PANEL
Anion gap: 9 (ref 5–15)
BUN: 24 mg/dL — ABNORMAL HIGH (ref 8–23)
CO2: 20 mmol/L — ABNORMAL LOW (ref 22–32)
Calcium: 7.7 mg/dL — ABNORMAL LOW (ref 8.9–10.3)
Chloride: 99 mmol/L (ref 98–111)
Creatinine, Ser: 1.25 mg/dL — ABNORMAL HIGH (ref 0.61–1.24)
GFR, Estimated: >60 mL/min (ref >60)
Glucose, Bld: 130 mg/dL — ABNORMAL HIGH (ref 70–99)
Potassium: 3.9 mmol/L (ref 3.5–5.1)
Sodium: 128 mmol/L — ABNORMAL LOW (ref 135–145)

## 2022-07-24 MED ORDER — FENTANYL CITRATE PF 50 MCG/ML IJ SOSY
25.0000 ug | PREFILLED_SYRINGE | Freq: Once | INTRAMUSCULAR | Status: AC
  Administered 2022-07-24: 25 ug via INTRAVENOUS
  Filled 2022-07-24: qty 1

## 2022-07-24 MED ORDER — HYDROMORPHONE HCL 1 MG/ML IJ SOLN
1.0000 mg | INTRAMUSCULAR | Status: DC | PRN
  Administered 2022-07-24 (×2): 2 mg via INTRAVENOUS
  Filled 2022-07-24 (×2): qty 2

## 2022-07-24 MED ORDER — ALPRAZOLAM 0.25 MG PO TABS
0.2500 mg | ORAL_TABLET | Freq: Every evening | ORAL | Status: AC | PRN
  Administered 2022-07-24: 0.25 mg via ORAL
  Filled 2022-07-24: qty 1

## 2022-07-24 MED ORDER — METHOCARBAMOL 500 MG PO TABS
500.0000 mg | ORAL_TABLET | Freq: Four times a day (QID) | ORAL | Status: DC | PRN
  Administered 2022-07-24 – 2022-07-25 (×4): 500 mg via ORAL
  Filled 2022-07-24 (×4): qty 1

## 2022-07-24 MED ORDER — HYDROMORPHONE HCL 1 MG/ML IJ SOLN
1.0000 mg | INTRAMUSCULAR | Status: DC | PRN
  Administered 2022-07-24 (×3): 2 mg via SUBCUTANEOUS
  Filled 2022-07-24 (×3): qty 2

## 2022-07-24 NOTE — Progress Notes (Signed)
PROGRESS NOTE    Robert Burnett  ZOX:[id number] DOB: [birth year] DOA: 07/21/2022 PCP: Charlott Rakes, MD    Brief Narrative:   HPI: Robert Burnett is a 61 y.o. male with medical history significant of  HTN, T2DM, fatty liver, HLD, hx of urothelial cancer, diabetic neuropathy who presented to ED with complaints of worsening left foot pain, foul drainage and swelling x 1 week. He was admitted in 03/2022 for diabetic foot infection with wet gangrene and necrotizing infection and osteomyelitis. S/p transmetatarsal amputation of the left foot with I&D by podiatry on 03/25/22 then OR again on 6/9 with Lisfranc amputation. ABI normal.   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  PT/OT evaluation pending.   Assessment and Plan: * Foot osteomyelitis, left ([place]) 61 year old with known left foot diabetic ulcer presenting with worsening pain, foul smelling drainage and swelling x 1 week found to have  Osteomyelitis, 10.8cm multiloculated abscess in posterior hindfoot and concner for septic arthritis vs. OM of tibiotalar and posterior subtalar joints off MRI -podiatry initially consulted and recommended ortho.  -Dr. Sharol Given: BKA 10/4 -continue vanc/zosyn for 24 hours post op- stop date added -pain medication with oxycodone and morphine for severe pain -on plavix/eliquis for recent DES to LAD in 05/2022 and PAF. Last took yesterday. Dr. Sharol Given okay to continue with plavix.  -ABI normal in 03/2022 -will need wound vac for 1 week -PT/OT ordered  Type 2 diabetes mellitus with complication, with long-term current use of insulin (HCC) Poorly controlled A1C in 03/2022 was 14.2 now 6.8 -adjust medications for better control after surgery SSI and accucheck qac/hs Continue farxiga   Hyponatremia -daily labs  CAD S/P percutaneous coronary angioplasty NSTEMI CAD s/p DES to proximal LAD 06/05/2022 Was on triple therapy with ASA, plavix and eliquis x1 month and has been continued on eliquis/plavix  Hold due to BKA plans for tomorrow, but  will need to start back at least plavix with history of recent DES less than 3 months ago.  Dr. Sharol Given: okay for plavix  Continue statin, toprol  Ischemic cardiomyopathy Euvolemic to dry Echo 05/2022: EF of 40-45%  Strict I/O Continue toprol and [facility] considering entresto if renal function allows   PAF (paroxysmal atrial fibrillation) ([place]) New diagnosis from hospitalization in august for NSTEMI ekg pending, NSR on exam Telemetry -resume eliquis in AM if Hgb ok and if ok with Dr. Sharol Given   Hypertension Continue metoprolol '25mg'$  TID   Normocytic anemia Baseline 9-10 -getting 2 units PRBC with surgery   Hyperlipidemia Continue lipitor '80mg'$  and zetia '10mg'$  daily   CKD (chronic kidney disease) stage 2, GFR 60-89 ml/min Stable, continue to monitor with vanc/zosyn   Tobacco abuse Smokes a pipe daily- nicotine patch    DVT prophylaxis: SCD's Start: 07/23/22 1506 SCDs Start: 07/22/22 1344    Code Status: Full Code   Disposition Plan:  Level of care: Telemetry Medical Status is: Inpatient Remains inpatient appropriate because: Pt/OT eval pending    Consultants:  Podiatry Ortho Sharol Given)  Subjective: Feels sore all over  Objective: Vitals:   07/23/22 2334 07/24/22 0451 07/24/22 0815 07/24/22 0836  BP: 129/79 139/86 127/88   Pulse: 70 87 (!) 59 100  Resp: '16 17 19   '$ Temp: 98.1 F (36.7 C) 98.2 F (36.8 C) 97.7 F (36.5 C)   TempSrc: Oral Oral Oral   SpO2: 98% 98% 99%   Weight:      Height:        Intake/Output Summary (Last 24 hours) at  07/24/2022 1104 Last data filed at 07/24/2022 0600 Gross per 24 hour  Intake 1555.83 ml  Output 1130 ml  Net 425.83 ml   Filed Weights   07/21/22 2113  Weight: 81.2 kg    Examination:   General: Appearance:    Elderly male in no acute distress     Lungs:     respirations unlabored  Heart:    Tachycardic.   MS:   Amputation noted  Neurologic:   Awake, alert, oriented x 3. No apparent focal neurological            defect.        Data Reviewed: I have personally reviewed following labs and imaging studies  CBC: Recent Labs  Lab 07/21/22 2055 07/22/22 0830 07/23/22 0617 07/23/22 1536 07/24/22 0243  WBC 13.9* 11.8* 7.4 9.9 8.1  NEUTROABS 12.5*  --   --  8.8*  --   HGB 8.8* 9.9* 7.5* 8.5* 9.1*  HCT 28.0* 30.5* 22.7* 26.1* 26.9*  MCV 82.6 80.5 79.9* 80.8 80.1  PLT 247 281 158 183 161   Basic Metabolic Panel: Recent Labs  Lab 07/21/22 2055 07/22/22 0830 07/23/22 0617 07/24/22 0243  NA 129* 131* 130* 128*  K 3.8 4.1 4.0 3.9  CL 95* 93* 95* 99  CO2 '22 23 23 '$ 20*  GLUCOSE 171* 157* 121* 130*  BUN '15 22 20 '$ 24*  CREATININE 1.25* 1.68* 1.32* 1.25*  CALCIUM 8.2* 9.0 8.5* 7.7*   GFR: Estimated Creatinine Clearance: 69.2 mL/min (A) (by C-G formula based on SCr of 1.25 mg/dL (H)). Liver Function Tests: Recent Labs  Lab 07/21/22 2055  AST 16  ALT 13  ALKPHOS 93  BILITOT 0.9  PROT 8.0  ALBUMIN 2.3*   No results for input(s): "LIPASE", "AMYLASE" in the last 168 hours. No results for input(s): "AMMONIA" in the last 168 hours. Coagulation Profile: No results for input(s): "INR", "PROTIME" in the last 168 hours. Cardiac Enzymes: No results for input(s): "CKTOTAL", "CKMB", "CKMBINDEX", "TROPONINI" in the last 168 hours. BNP (last 3 results) No results for input(s): "PROBNP" in the last 8760 hours. HbA1C: Recent Labs    07/22/22 0830  HGBA1C 6.8*   CBG: Recent Labs  Lab 07/23/22 1118 07/23/22 1354 07/23/22 1718 07/23/22 2017 07/24/22 0820  GLUCAP 127* 127* 137* 175* 142*   Lipid Profile: No results for input(s): "CHOL", "HDL", "LDLCALC", "TRIG", "CHOLHDL", "LDLDIRECT" in the last 72 hours. Thyroid Function Tests: No results for input(s): "TSH", "T4TOTAL", "FREET4", "T3FREE", "THYROIDAB" in the last 72 hours. Anemia Panel: Recent Labs    07/22/22 0830  FERRITIN 161  TIBC 301  IRON 25*   Sepsis Labs: Recent Labs  Lab 07/21/22 2055  LATICACIDVEN 1.4     Recent Results (from the past 240 hour(s))  Blood culture (routine x 2)     Status: None (Preliminary result)   Collection Time: 07/22/22  8:04 AM   Specimen: BLOOD LEFT ARM  Result Value Ref Range Status   Specimen Description BLOOD LEFT ARM  Final   Special Requests   Final    BOTTLES DRAWN AEROBIC AND ANAEROBIC Blood Culture results may not be optimal due to an inadequate volume of blood received in culture bottles   Culture   Final    NO GROWTH 2 DAYS Performed at [facility], [place] [address], [place], [place] [address]    Report Status PENDING  Incomplete  Blood culture (routine x 2)     Status: None (Preliminary result)   Collection  Time: 07/22/22  8:09 AM   Specimen: BLOOD LEFT HAND  Result Value Ref Range Status   Specimen Description BLOOD LEFT HAND  Final   Special Requests   Final    BOTTLES DRAWN AEROBIC AND ANAEROBIC Blood Culture results may not be optimal due to an inadequate volume of blood received in culture bottles   Culture   Final    NO GROWTH 2 DAYS Performed at [facility], [place] [address] [place], [place] [address]    Report Status PENDING  Incomplete  Surgical pcr screen     Status: Abnormal   Collection Time: 07/23/22  3:45 AM   Specimen: Nasal Mucosa; Nasal Swab  Result Value Ref Range Status   MRSA, PCR NEGATIVE NEGATIVE Final   Staphylococcus aureus POSITIVE (A) NEGATIVE Final    Comment: (NOTE) The Xpert SA Assay (FDA approved for NASAL specimens in patients 26 years of age and older), is one component of a comprehensive surveillance program. It is not intended to diagnose infection nor to guide or monitor treatment. Performed at [facility], [place] [address], [place], [place] [address]          Radiology Studies: No results found.      Scheduled Meds:  sodium chloride   Intravenous Once   vitamin C  1,000 mg Oral Daily   atorvastatin  80 mg Oral Daily   Chlorhexidine Gluconate Cloth  6 each Topical Q0600    clopidogrel  75 mg Oral Daily   dapagliflozin propanediol  10 mg Oral QAC breakfast   docusate sodium  100 mg Oral Daily   ezetimibe  10 mg Oral Daily   insulin aspart  0-9 Units Subcutaneous TID WC   insulin detemir  8 Units Subcutaneous QHS   metoprolol succinate  25 mg Oral TID   mupirocin ointment  1 Application Nasal BID   nicotine  7 mg Transdermal Daily   nutrition supplement (JUVEN)  1 packet Oral BID BM   pantoprazole  40 mg Oral Daily   zinc sulfate  220 mg Oral Daily   Continuous Infusions:  sodium chloride 75 mL/hr at 07/23/22 1528   magnesium sulfate bolus IVPB     piperacillin-tazobactam (ZOSYN)  IV 3.375 g (07/24/22 0532)   vancomycin 1,750 mg (07/23/22 1243)     LOS: 2 days    Time spent: 45 minutes spent on chart review, discussion with nursing staff, consultants, updating family and interview/physical exam; more than 50% of that time was spent in counseling and/or coordination of care.    Geradine Girt, DO [facility] Available via Epic secure chat 7am-7pm After these hours, please refer to coverage provider listed on [email or web address] 07/24/2022, 11:04 AM

## 2022-07-24 NOTE — Evaluation (Signed)
Physical Therapy Evaluation Patient Details Name: Robert Burnett MRN: [id number] DOB: Feb 01, 1961 Today's Date: 07/24/2022  History of Present Illness  61 yr old male who presented 07/22/22 due to increase in  L foot pain with drainage and swelling for a week. 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 PMH: HTN, T2DM, fatty liver, HLD, hx of urothelial cancer, diabetic neuropathy, 6/23 diabetic foot infection and necrotizing infection and OA with transmetatarsal amputaion, NSTEMI  Clinical Impression  Patient admitted with the above. PTA, patient lives with nephew and was independent. Patient presents with increased pain, weakness, impaired balance, impaired functional mobility, and decreased activity tolerance.  Currently limited by pain and elevated HR with minimal activity (up to 150). Able to stand from very elevated bed surface with minA+2 and RW. Educated patient on limb positioning, importance of knee extension, and phantom limb sensations/pain, patient verbalized understanding. Patient will benefit from skilled PT services during acute stay to address listed deficits. Recommend AIR at discharge to maximize functional independence and safety.      Recommendations for follow up therapy are one component of a multi-disciplinary discharge planning process, led by the attending physician.  Recommendations may be updated based on patient status, additional functional criteria and insurance authorization.  Follow Up Recommendations Acute inpatient rehab (3hours/day)      Assistance Recommended at Discharge Frequent or constant Supervision/Assistance  Patient can return home with the following  A lot of help with walking and/or transfers;A little help with bathing/dressing/bathroom;Assistance with cooking/housework;Assist for transportation;Help with stairs or ramp for entrance    Equipment Recommendations Rolling Brenton Joines (2 wheels);BSC/3in1;Wheelchair (measurements PT);Wheelchair cushion  (measurements PT)  Recommendations for Other Services  Rehab consult    Functional Status Assessment Patient has had a recent decline in their functional status and demonstrates the ability to make significant improvements in function in a reasonable and predictable amount of time.     Precautions / Restrictions Precautions Precautions: Fall Precaution Comments: wound vac; watch HR Restrictions Weight Bearing Restrictions: Yes LLE Weight Bearing: Non weight bearing      Mobility  Bed Mobility Overal bed mobility: Needs Assistance Bed Mobility: Supine to Sit, Sit to Supine     Supine to sit: Supervision Sit to supine: Supervision        Transfers Overall transfer level: Needs assistance Equipment used: Rolling Rillie Riffel (2 wheels) Transfers: Sit to/from Stand Sit to Stand: Min assist, +2 physical assistance           General transfer comment: assist to power up into standing from very elevated surface. Cues for hand placement. Deferred further mobility due to elevated HR in 140s-150    Ambulation/Gait                  Stairs            Wheelchair Mobility    Modified Rankin (Stroke Patients Only)       Balance Overall balance assessment: Needs assistance Sitting-balance support: No upper extremity supported, Feet supported Sitting balance-[name]: Good     Standing balance support: Bilateral upper extremity supported, Reliant on assistive device for balance Standing balance-[name]: Poor                               Pertinent Vitals/Pain Pain Assessment Pain Assessment: Faces Faces Pain Scale: Hurts whole lot Pain Location: L residual limb Pain Descriptors / Indicators: Discomfort, Grimacing, Guarding Pain Intervention(s): Monitored during session  Home Living Family/patient expects to be discharged to:: Private residence Living Arrangements: Other relatives Available Help at Discharge: Family;Available  PRN/intermittently Type of Home: House Home Access: Stairs to enter Entrance Stairs-Rails: None Entrance Stairs-Number of Steps: small step and 6 inch step   Home Layout: One level Home Equipment: Conservation officer, nature (2 wheels);Wheelchair - manual;BSC/3in1;Crutches;Tub bench Additional Comments: nephew works    Prior Function Prior Level of Function : Independent/Modified Independent             Mobility Comments: recently using crutches to offweight L foot ulcer       Hand Dominance        Extremity/Trunk Assessment   Upper Extremity Assessment Upper Extremity Assessment: Defer to OT evaluation    Lower Extremity Assessment Lower Extremity Assessment: LLE deficits/detail LLE Deficits / Details: post op pain and weakness    Cervical / Trunk Assessment Cervical / Trunk Assessment: Normal  Communication   Communication: No difficulties  Cognition Arousal/Alertness: Awake/alert Behavior During Therapy: WFL for tasks assessed/performed Overall Cognitive Status: Within Functional Limits for tasks assessed                                          General Comments      Exercises     Assessment/Plan    PT Assessment Patient needs continued PT services  PT Problem List Decreased strength;Decreased activity tolerance;Decreased balance;Decreased mobility;Decreased knowledge of use of DME;Decreased safety awareness;Decreased knowledge of precautions;Pain       PT Treatment Interventions DME instruction;Gait training;Stair training;Functional mobility training;Therapeutic activities;Therapeutic exercise;Balance training;Patient/family education;Wheelchair mobility training    PT Goals (Current goals can be found in the Care Plan section)  Acute Rehab PT Goals Patient Stated Goal: to reduce pain PT Goal Formulation: With patient Time For Goal Achievement: 08/07/22 Potential to Achieve Goals: Good    Frequency Min 3X/week     Co-evaluation                AM-PAC PT "6 Clicks" Mobility  Outcome Measure Help needed turning from your back to your side while in a flat bed without using bedrails?: A Little Help needed moving from lying on your back to sitting on the side of a flat bed without using bedrails?: A Little Help needed moving to and from a bed to a chair (including a wheelchair)?: A Little Help needed standing up from a chair using your arms (e.g., wheelchair or bedside chair)?: A Little Help needed to walk in hospital room?: Total Help needed climbing 3-5 steps with a railing? : Total 6 Click Score: 14    End of Session Equipment Utilized During Treatment: Gait belt Activity Tolerance: Patient tolerated treatment well;Patient limited by pain Patient left: in bed;with call bell/phone within reach Nurse Communication: Mobility status PT Visit Diagnosis: Unsteadiness on feet (R26.81);Other abnormalities of gait and mobility (R26.89);Muscle weakness (generalized) (M62.81)    Time: 3235-5732 PT Time Calculation (min) (ACUTE ONLY): 22 min   Charges:   PT Evaluation $PT Eval Moderate Complexity: 1 Mod          Joshus Rogan A. Gilford Rile PT, DPT Acute Rehabilitation Services Office [phone]   [name] 07/24/2022, 12:20 PM

## 2022-07-24 NOTE — Progress Notes (Signed)
  Inpatient Rehabilitation Admissions Coordinator   Met with patient at bedside for rehab assessment. We discussed goals and expectations of a possible CIR admit. He prefers CIR for rehab. I will begin insurance Auth with Christella Scheuermann for possible CIR admit pending approval. Please call me with any questions.   Danne Baxter, RN, MSN Rehab Admissions Coordinator [phone]

## 2022-07-24 NOTE — Evaluation (Signed)
Occupational Therapy Evaluation Patient Details Name: Robert Burnett MRN: [id number] DOB: 1961/07/10 Today's Date: 07/24/2022   History of Present Illness 61 yr old male who presented 07/22/22 due to increase in  L foot pain with drainage and swelling for a week. 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 PMH: HTN, T2DM, fatty liver, HLD, hx of urothelial cancer, diabetic neuropathy, 6/23 diabetic foot infection and necrotizing infection and OA with transmetatarsal amputaion, NSTEMI   Clinical Impression   Pt was limited in session due to pain and elevated HR to 150 in session with bed mobility. Pt was premedicated prior to session and reposition after session with HR 122. Pt required education on how to use of walker and WB of LLE in session and then was able to follow through with all education and cues at this time. Pt was able to complete bed mobility with increase in time with supervision and sit to stand with min x2. Pt demonstrated motivation to complete therapy but just limited due to reported pain and HR in this session.  Pt currently with functional limitations due to the deficits listed below (see OT Problem List).  Pt will benefit from skilled OT to increase their safety and independence with ADL and functional mobility for ADL to facilitate discharge to venue listed below.        Recommendations for follow up therapy are one component of a multi-disciplinary discharge planning process, led by the attending physician.  Recommendations may be updated based on patient status, additional functional criteria and insurance authorization.   Follow Up Recommendations  Acute inpatient rehab (3hours/day)    Assistance Recommended at Discharge Frequent or constant Supervision/Assistance  Patient can return home with the following A lot of help with walking and/or transfers;A lot of help with bathing/dressing/bathroom;Assistance with cooking/housework;Assist for transportation    Functional  Status Assessment  Patient has had a recent decline in their functional status and demonstrates the ability to make significant improvements in function in a reasonable and predictable amount of time.  Equipment Recommendations  None recommended by OT (TBD)    Recommendations for Other Services       Precautions / Restrictions Precautions Precautions: Fall Precaution Comments: wound vac; watch HR Restrictions Weight Bearing Restrictions: Yes LLE Weight Bearing: Non weight bearing      Mobility Bed Mobility Overal bed mobility: Needs Assistance Bed Mobility: Supine to Sit, Sit to Supine     Supine to sit: Supervision Sit to supine: Supervision        Transfers Overall transfer level: Needs assistance Equipment used: Rolling walker (2 wheels) Transfers: Sit to/from Stand Sit to Stand: Min assist, +2 physical assistance           General transfer comment: assist to power up into standing from very elevated surface. Cues for hand placement. Deferred further mobility due to elevated HR in 140s-150      Balance Overall balance assessment: Needs assistance Sitting-balance support: No upper extremity supported, Feet supported Sitting balance-[name]: Good     Standing balance support: Bilateral upper extremity supported, Reliant on assistive device for balance Standing balance-[name]: Poor                             ADL either performed or assessed with clinical judgement   ADL Overall ADL's : Needs assistance/impaired Eating/Feeding: Independent;Sitting   Grooming: Wash/dry hands;Wash/dry face;Oral care;Set up;Sitting;Bed level   Upper Body Bathing: Set up;Sitting  Lower Body Bathing: Maximal assistance;Cueing for safety;Cueing for sequencing;Sit to/from stand   Upper Body Dressing : Set up;Sitting;Bed level   Lower Body Dressing: Maximal assistance;Cueing for safety;Cueing for sequencing;Sit to/from stand                  General ADL Comments: Pt limited in transfers at this time due to pain and HR     Vision         Perception     Praxis      Pertinent Vitals/Pain Pain Assessment Pain Assessment: Faces Faces Pain Scale: Hurts whole lot Pain Location: L residual limb Pain Descriptors / Indicators: Discomfort, Grimacing, Guarding Pain Intervention(s): Limited activity within patient's tolerance, Monitored during session     Hand Dominance Right   Extremity/Trunk Assessment Upper Extremity Assessment Upper Extremity Assessment: Overall WFL for tasks assessed   Lower Extremity Assessment Lower Extremity Assessment: Defer to PT evaluation LLE Deficits / Details: post op pain and weakness   Cervical / Trunk Assessment Cervical / Trunk Assessment: Normal   Communication Communication Communication: No difficulties   Cognition Arousal/Alertness: Awake/alert Behavior During Therapy: WFL for tasks assessed/performed Overall Cognitive Status: Within Functional Limits for tasks assessed                                       General Comments       Exercises     Shoulder Instructions      Home Living Family/patient expects to be discharged to:: Private residence Living Arrangements: Other relatives Available Help at Discharge: Family;Available PRN/intermittently Type of Home: House Home Access: Stairs to enter [employer] of Steps: small step and 6 inch step Entrance Stairs-Rails: None Home Layout: One level     Bathroom Shower/Tub: Tub/shower unit;Curtain;Sponge bathes at baseline   Bathroom Toilet: Handicapped height     Home Equipment: Conservation officer, nature (2 wheels);Wheelchair - manual;BSC/3in1;Crutches;Tub bench   Additional Comments: nephew works      Prior Functioning/Environment Prior Level of Function : Independent/Modified Independent             Mobility Comments: recently using crutches to offweight L foot ulcer          OT  Problem List: Decreased strength;Decreased range of motion;Decreased activity tolerance;Impaired balance (sitting and/or standing);Decreased safety awareness;Decreased knowledge of use of DME or AE;Decreased knowledge of precautions;Cardiopulmonary status limiting activity;Pain      OT Treatment/Interventions: Self-care/ADL training;Therapeutic exercise;DME and/or AE instruction;Therapeutic activities;Visual/perceptual remediation/compensation;Patient/family education;Balance training    OT Goals(Current goals can be found in the care plan section) Acute Rehab OT Goals Patient Stated Goal: to be able to complete more OT Goal Formulation: With patient Time For Goal Achievement: 08/07/22 Potential to Achieve Goals: Good ADL Goals Pt Will Perform Lower Body Bathing: with modified independence;sit to/from stand;sitting/lateral leans Pt Will Perform Lower Body Dressing: with modified independence;with adaptive equipment;sitting/lateral leans Pt Will Transfer to Toilet: with min guard assist;regular height toilet;ambulating  OT Frequency: Min 2X/week    Co-evaluation PT/OT/SLP Co-Evaluation/Treatment: Yes Reason for Co-Treatment: Complexity of the patient's impairments (multi-system involvement) (pain)   OT goals addressed during session: ADL's and self-care      AM-PAC OT "6 Clicks" Daily Activity     Outcome Measure Help from another person eating meals?: None Help from another person taking care of personal grooming?: None Help from another person toileting, which includes using toliet, bedpan, or urinal?: A Lot Help from  another person bathing (including washing, rinsing, drying)?: A Lot Help from another person to put on and taking off regular upper body clothing?: A Little Help from another person to put on and taking off regular lower body clothing?: A Lot 6 Click Score: 17   End of Session Equipment Utilized During Treatment: Gait belt;Rolling walker (2 wheels) Nurse  Communication: Mobility status  Activity Tolerance: Patient tolerated treatment well Patient left: in bed;with bed alarm set;with call bell/phone within reach  OT Visit Diagnosis: Unsteadiness on feet (R26.81);Other abnormalities of gait and mobility (R26.89);Repeated falls (R29.6);Muscle weakness (generalized) (M62.81);Pain Pain - Right/Left: Left Pain - part of body: Leg                Time: 8676-1950 OT Time Calculation (min): 20 min Charges:  OT General Charges $OT Visit: 1 Visit OT Evaluation $OT Eval Moderate Complexity: 1 Mod  Joeseph Amor OTR/L  Acute Rehab Services  [phone] office number [phone] pager number   Joeseph Amor 07/24/2022, 1:03 PM

## 2022-07-24 NOTE — Anesthesia Postprocedure Evaluation (Signed)
Anesthesia Post Note  Patient: Robert Burnett  Procedure(s) Performed: LEFT BELOW KNEE AMPUTATION (Left: Knee)     Patient location during evaluation: PACU Anesthesia Type: Regional and MAC Level of consciousness: awake and alert Pain management: pain level controlled Vital Signs Assessment: post-procedure vital signs reviewed and stable Respiratory status: spontaneous breathing, nonlabored ventilation, respiratory function stable and patient connected to nasal cannula oxygen Cardiovascular status: blood pressure returned to baseline and stable Postop Assessment: no apparent nausea or vomiting Anesthetic complications: no   No notable events documented.  Last Vitals:  Vitals:   07/24/22 1202 07/24/22 1648  BP: 129/85 138/77  Pulse: 70 75  Resp: 18 19  Temp: 36.9 C 36.6 C  SpO2: 99% 98%    Last Pain:  Vitals:   07/24/22 1659  TempSrc:   PainSc: [place]

## 2022-07-24 NOTE — Progress Notes (Signed)
OT Cancellation Note  Patient Details Name: Robert Burnett MRN: [id number] DOB: 03-Oct-1961   Cancelled Treatment:    Reason Eval/Treat Not Completed: Pain limiting ability to participate Pt was medicated prior but will follow up.  Joeseph Amor OTR/L  Acute Rehab Services  [phone] office number [phone] pager number   Joeseph Amor 07/24/2022, 8:32 AM

## 2022-07-24 NOTE — Progress Notes (Signed)
Received a call from Tele  at [phone] that pt had 3 beats run of SVT. Pt is asymptomatic, VS are stable. On call provider notified.

## 2022-07-24 NOTE — Progress Notes (Signed)
[name] The patient is a 61 year old gentleman seen on post op day 1 status post Left below knee amputation. Sitting up in bed conversant. Complaining of stabbing pain to LLE. Is some better than was over night. Wound vac in place with good seal to fit. No drainage in canister. Limb protector in place.  Patient considering rehab placement for discharge.  Plan to transition to preveena portable vac for discharge.  Suzan Slick, NP [phone]

## 2022-07-25 ENCOUNTER — Inpatient Hospital Stay (HOSPITAL_COMMUNITY)
Admission: RE | Admit: 2022-07-25 | Discharge: 2022-08-07 | DRG: 560 | Disposition: A | Discharge status: Home Health | Payer: Commercial Managed Care - HMO | Source: Intra-hospital | Attending: Physical Medicine and Rehabilitation | Admitting: Physical Medicine and Rehabilitation

## 2022-07-25 ENCOUNTER — Other Ambulatory Visit: Payer: Self-pay

## 2022-07-25 ENCOUNTER — Encounter (HOSPITAL_COMMUNITY): Payer: Self-pay | Admitting: Physical Medicine and Rehabilitation

## 2022-07-25 DIAGNOSIS — Z741 Need for assistance with personal care: Secondary | ICD-10-CM | POA: Diagnosis present

## 2022-07-25 DIAGNOSIS — R5381 Other malaise: Secondary | ICD-10-CM | POA: Diagnosis present

## 2022-07-25 DIAGNOSIS — N182 Chronic kidney disease, stage 2 (mild): Secondary | ICD-10-CM | POA: Diagnosis present

## 2022-07-25 DIAGNOSIS — M1990 Unspecified osteoarthritis, unspecified site: Secondary | ICD-10-CM | POA: Diagnosis present

## 2022-07-25 DIAGNOSIS — D631 Anemia in chronic kidney disease: Secondary | ICD-10-CM | POA: Diagnosis present

## 2022-07-25 DIAGNOSIS — Z79899 Other long term (current) drug therapy: Secondary | ICD-10-CM

## 2022-07-25 DIAGNOSIS — I252 Old myocardial infarction: Secondary | ICD-10-CM

## 2022-07-25 DIAGNOSIS — E785 Hyperlipidemia, unspecified: Secondary | ICD-10-CM | POA: Diagnosis present

## 2022-07-25 DIAGNOSIS — M869 Osteomyelitis, unspecified: Secondary | ICD-10-CM | POA: Diagnosis present

## 2022-07-25 DIAGNOSIS — L7622 Postprocedural hemorrhage and hematoma of skin and subcutaneous tissue following other procedure: Secondary | ICD-10-CM | POA: Diagnosis not present

## 2022-07-25 DIAGNOSIS — Z8554 Personal history of malignant neoplasm of ureter: Secondary | ICD-10-CM | POA: Diagnosis not present

## 2022-07-25 DIAGNOSIS — I129 Hypertensive chronic kidney disease with stage 1 through stage 4 chronic kidney disease, or unspecified chronic kidney disease: Secondary | ICD-10-CM | POA: Diagnosis present

## 2022-07-25 DIAGNOSIS — Z9861 Coronary angioplasty status: Secondary | ICD-10-CM

## 2022-07-25 DIAGNOSIS — Z79891 Long term (current) use of opiate analgesic: Secondary | ICD-10-CM

## 2022-07-25 DIAGNOSIS — I480 Paroxysmal atrial fibrillation: Secondary | ICD-10-CM | POA: Diagnosis present

## 2022-07-25 DIAGNOSIS — E1142 Type 2 diabetes mellitus with diabetic polyneuropathy: Secondary | ICD-10-CM | POA: Diagnosis present

## 2022-07-25 DIAGNOSIS — Y838 Other surgical procedures as the cause of abnormal reaction of the patient, or of later complication, without mention of misadventure at the time of the procedure: Secondary | ICD-10-CM | POA: Diagnosis not present

## 2022-07-25 DIAGNOSIS — E11621 Type 2 diabetes mellitus with foot ulcer: Secondary | ICD-10-CM

## 2022-07-25 DIAGNOSIS — Z7901 Long term (current) use of anticoagulants: Secondary | ICD-10-CM | POA: Diagnosis not present

## 2022-07-25 DIAGNOSIS — F17290 Nicotine dependence, other tobacco product, uncomplicated: Secondary | ICD-10-CM | POA: Diagnosis present

## 2022-07-25 DIAGNOSIS — M25551 Pain in right hip: Secondary | ICD-10-CM | POA: Diagnosis not present

## 2022-07-25 DIAGNOSIS — Y92239 Unspecified place in hospital as the place of occurrence of the external cause: Secondary | ICD-10-CM | POA: Diagnosis not present

## 2022-07-25 DIAGNOSIS — D649 Anemia, unspecified: Secondary | ICD-10-CM | POA: Diagnosis not present

## 2022-07-25 DIAGNOSIS — Z716 Tobacco abuse counseling: Secondary | ICD-10-CM

## 2022-07-25 DIAGNOSIS — M549 Dorsalgia, unspecified: Secondary | ICD-10-CM | POA: Diagnosis present

## 2022-07-25 DIAGNOSIS — D62 Acute posthemorrhagic anemia: Secondary | ICD-10-CM | POA: Diagnosis present

## 2022-07-25 DIAGNOSIS — Z4781 Encounter for orthopedic aftercare following surgical amputation: Secondary | ICD-10-CM | POA: Diagnosis present

## 2022-07-25 DIAGNOSIS — I2510 Atherosclerotic heart disease of native coronary artery without angina pectoris: Secondary | ICD-10-CM | POA: Diagnosis present

## 2022-07-25 DIAGNOSIS — K5900 Constipation, unspecified: Secondary | ICD-10-CM | POA: Diagnosis not present

## 2022-07-25 DIAGNOSIS — S88112A Complete traumatic amputation at level between knee and ankle, left lower leg, initial encounter: Secondary | ICD-10-CM | POA: Diagnosis not present

## 2022-07-25 DIAGNOSIS — Z801 Family history of malignant neoplasm of trachea, bronchus and lung: Secondary | ICD-10-CM

## 2022-07-25 DIAGNOSIS — Z905 Acquired absence of kidney: Secondary | ICD-10-CM

## 2022-07-25 DIAGNOSIS — G546 Phantom limb syndrome with pain: Secondary | ICD-10-CM | POA: Diagnosis present

## 2022-07-25 DIAGNOSIS — F17210 Nicotine dependence, cigarettes, uncomplicated: Secondary | ICD-10-CM | POA: Diagnosis present

## 2022-07-25 DIAGNOSIS — M62838 Other muscle spasm: Secondary | ICD-10-CM | POA: Diagnosis present

## 2022-07-25 DIAGNOSIS — W19XXXA Unspecified fall, initial encounter: Secondary | ICD-10-CM | POA: Diagnosis not present

## 2022-07-25 DIAGNOSIS — R12 Heartburn: Secondary | ICD-10-CM

## 2022-07-25 DIAGNOSIS — Z7902 Long term (current) use of antithrombotics/antiplatelets: Secondary | ICD-10-CM

## 2022-07-25 DIAGNOSIS — M940 Chondrocostal junction syndrome [Tietze]: Secondary | ICD-10-CM | POA: Diagnosis present

## 2022-07-25 DIAGNOSIS — R5382 Chronic fatigue, unspecified: Secondary | ICD-10-CM | POA: Diagnosis present

## 2022-07-25 DIAGNOSIS — E871 Hypo-osmolality and hyponatremia: Secondary | ICD-10-CM | POA: Diagnosis present

## 2022-07-25 DIAGNOSIS — Z89512 Acquired absence of left leg below knee: Secondary | ICD-10-CM | POA: Diagnosis not present

## 2022-07-25 DIAGNOSIS — G894 Chronic pain syndrome: Secondary | ICD-10-CM | POA: Diagnosis present

## 2022-07-25 DIAGNOSIS — R000 Tachycardia, unspecified: Secondary | ICD-10-CM | POA: Diagnosis not present

## 2022-07-25 DIAGNOSIS — R410 Disorientation, unspecified: Secondary | ICD-10-CM | POA: Diagnosis not present

## 2022-07-25 DIAGNOSIS — E1169 Type 2 diabetes mellitus with other specified complication: Secondary | ICD-10-CM | POA: Diagnosis not present

## 2022-07-25 DIAGNOSIS — L97529 Non-pressure chronic ulcer of other part of left foot with unspecified severity: Secondary | ICD-10-CM

## 2022-07-25 DIAGNOSIS — Z8249 Family history of ischemic heart disease and other diseases of the circulatory system: Secondary | ICD-10-CM

## 2022-07-25 DIAGNOSIS — T8141XA Infection following a procedure, superficial incisional surgical site, initial encounter: Secondary | ICD-10-CM | POA: Diagnosis not present

## 2022-07-25 DIAGNOSIS — M25552 Pain in left hip: Secondary | ICD-10-CM | POA: Diagnosis present

## 2022-07-25 DIAGNOSIS — E1122 Type 2 diabetes mellitus with diabetic chronic kidney disease: Secondary | ICD-10-CM | POA: Diagnosis present

## 2022-07-25 DIAGNOSIS — Z7982 Long term (current) use of aspirin: Secondary | ICD-10-CM

## 2022-07-25 DIAGNOSIS — E559 Vitamin D deficiency, unspecified: Secondary | ICD-10-CM | POA: Diagnosis present

## 2022-07-25 DIAGNOSIS — Z794 Long term (current) use of insulin: Secondary | ICD-10-CM

## 2022-07-25 DIAGNOSIS — G4700 Insomnia, unspecified: Secondary | ICD-10-CM | POA: Diagnosis present

## 2022-07-25 DIAGNOSIS — Z833 Family history of diabetes mellitus: Secondary | ICD-10-CM

## 2022-07-25 DIAGNOSIS — T43215A Adverse effect of selective serotonin and norepinephrine reuptake inhibitors, initial encounter: Secondary | ICD-10-CM | POA: Diagnosis not present

## 2022-07-25 LAB — CBC
HCT: 27.2 % — ABNORMAL LOW (ref 39.0–52.0)
Hemoglobin: 9 g/dL — ABNORMAL LOW (ref 13.0–17.0)
MCH: 26.5 pg (ref 26.0–34.0)
MCHC: 33.1 g/dL (ref 30.0–36.0)
MCV: 80.2 fL (ref 80.0–100.0)
Platelets: 211 10*3/uL (ref 150–400)
RBC: 3.39 MIL/uL — ABNORMAL LOW (ref 4.22–5.81)
RDW: 14.9 % (ref 11.5–15.5)
WBC: 7.3 10*3/uL (ref 4.0–10.5)
nRBC: 0 % (ref 0.0–0.2)

## 2022-07-25 LAB — BASIC METABOLIC PANEL
Anion gap: 10 (ref 5–15)
BUN: 21 mg/dL (ref 8–23)
CO2: 21 mmol/L — ABNORMAL LOW (ref 22–32)
Calcium: 8.4 mg/dL — ABNORMAL LOW (ref 8.9–10.3)
Chloride: 97 mmol/L — ABNORMAL LOW (ref 98–111)
Creatinine, Ser: 1.09 mg/dL (ref 0.61–1.24)
GFR, Estimated: >60 mL/min (ref >60)
Glucose, Bld: 111 mg/dL — ABNORMAL HIGH (ref 70–99)
Potassium: 3.7 mmol/L (ref 3.5–5.1)
Sodium: 128 mmol/L — ABNORMAL LOW (ref 135–145)

## 2022-07-25 LAB — GLUCOSE, CAPILLARY
Glucose-Capillary: 179 mg/dL — ABNORMAL HIGH (ref 70–99)
Glucose-Capillary: 181 mg/dL — ABNORMAL HIGH (ref 70–99)
Glucose-Capillary: 142 mg/dL — ABNORMAL HIGH (ref 70–99)
Glucose-Capillary: 194 mg/dL — ABNORMAL HIGH (ref 70–99)

## 2022-07-25 MED ORDER — VITAMIN C 500 MG PO TABS
1000.0000 mg | ORAL_TABLET | Freq: Every day | ORAL | Status: DC
  Administered 2022-07-26 – 2022-08-07 (×13): 1000 mg via ORAL
  Filled 2022-07-25 (×15): qty 2

## 2022-07-25 MED ORDER — NICOTINE 7 MG/24HR TD PT24
7.0000 mg | MEDICATED_PATCH | Freq: Every day | TRANSDERMAL | Status: DC
  Administered 2022-07-26 – 2022-08-07 (×13): 7 mg via TRANSDERMAL
  Filled 2022-07-25 (×14): qty 1

## 2022-07-25 MED ORDER — TRAZODONE HCL 50 MG PO TABS
25.0000 mg | ORAL_TABLET | Freq: Every evening | ORAL | Status: DC | PRN
  Administered 2022-07-25 – 2022-08-06 (×10): 50 mg via ORAL
  Filled 2022-07-25 (×10): qty 1

## 2022-07-25 MED ORDER — PANTOPRAZOLE SODIUM 40 MG PO TBEC
40.0000 mg | DELAYED_RELEASE_TABLET | Freq: Every day | ORAL | Status: DC
  Administered 2022-07-26 – 2022-08-07 (×13): 40 mg via ORAL
  Filled 2022-07-25 (×13): qty 1

## 2022-07-25 MED ORDER — INSULIN DETEMIR 100 UNIT/ML ~~LOC~~ SOLN
8.0000 [IU] | Freq: Every day | SUBCUTANEOUS | Status: DC
  Administered 2022-07-25 – 2022-08-06 (×13): 8 [IU] via SUBCUTANEOUS
  Filled 2022-07-25 (×16): qty 0.08

## 2022-07-25 MED ORDER — INSULIN ASPART 100 UNIT/ML IJ SOLN: 0.0000 [IU] | Freq: Three times a day (TID) | INTRAMUSCULAR | 11 refills | Status: DC

## 2022-07-25 MED ORDER — EZETIMIBE 10 MG PO TABS
10.0000 mg | ORAL_TABLET | Freq: Every day | ORAL | Status: DC
  Administered 2022-07-26 – 2022-08-07 (×13): 10 mg via ORAL
  Filled 2022-07-25 (×13): qty 1

## 2022-07-25 MED ORDER — MUPIROCIN 2 % EX OINT
1.0000 | TOPICAL_OINTMENT | Freq: Two times a day (BID) | CUTANEOUS | Status: AC
  Administered 2022-07-25 – 2022-07-27 (×5): 1 via NASAL
  Filled 2022-07-25: qty 22

## 2022-07-25 MED ORDER — DAPAGLIFLOZIN PROPANEDIOL 10 MG PO TABS
10.0000 mg | ORAL_TABLET | Freq: Every day | ORAL | Status: DC
  Administered 2022-07-26 – 2022-08-07 (×13): 10 mg via ORAL
  Filled 2022-07-25 (×14): qty 1

## 2022-07-25 MED ORDER — JUVEN PO PACK
1.0000 | PACK | Freq: Two times a day (BID) | ORAL | Status: DC
  Administered 2022-07-26 – 2022-08-07 (×21): 1 via ORAL
  Filled 2022-07-25 (×22): qty 1

## 2022-07-25 MED ORDER — PROCHLORPERAZINE EDISYLATE 10 MG/2ML IJ SOLN: 5.0000 mg | Freq: Four times a day (QID) | INTRAMUSCULAR | Status: DC | PRN

## 2022-07-25 MED ORDER — DOCUSATE SODIUM 100 MG PO CAPS
100.0000 mg | ORAL_CAPSULE | Freq: Every day | ORAL | Status: DC
  Administered 2022-07-26 – 2022-08-07 (×12): 100 mg via ORAL
  Filled 2022-07-25 (×13): qty 1

## 2022-07-25 MED ORDER — PROCHLORPERAZINE 25 MG RE SUPP: 12.5000 mg | Freq: Four times a day (QID) | RECTAL | Status: DC | PRN

## 2022-07-25 MED ORDER — FLEET ENEMA 7-19 GM/118ML RE ENEM
1.0000 | ENEMA | Freq: Once | RECTAL | Status: DC | PRN
  Filled 2022-07-25: qty 1

## 2022-07-25 MED ORDER — GUAIFENESIN-DM 100-10 MG/5ML PO SYRP: 5.0000 mL | ORAL_SOLUTION | Freq: Four times a day (QID) | ORAL | Status: DC | PRN

## 2022-07-25 MED ORDER — HYDROMORPHONE HCL 1 MG/ML IJ SOLN: 1.0000 mg | INTRAMUSCULAR | Status: DC | PRN

## 2022-07-25 MED ORDER — OXYCODONE HCL 5 MG PO TABS
10.0000 mg | ORAL_TABLET | ORAL | Status: DC | PRN
  Administered 2022-07-25: 15 mg via ORAL
  Administered 2022-07-26: 10 mg via ORAL
  Administered 2022-07-26 – 2022-07-30 (×15): 15 mg via ORAL
  Administered 2022-07-30 – 2022-07-31 (×6): 10 mg via ORAL
  Administered 2022-07-31 – 2022-08-01 (×3): 15 mg via ORAL
  Administered 2022-08-02: 10 mg via ORAL
  Administered 2022-08-02 – 2022-08-05 (×14): 15 mg via ORAL
  Administered 2022-08-05: 10 mg via ORAL
  Administered 2022-08-05 – 2022-08-07 (×9): 15 mg via ORAL
  Administered 2022-08-07: 10 mg via ORAL
  Filled 2022-07-25: qty 2
  Filled 2022-07-25 (×9): qty 3
  Filled 2022-07-25 (×2): qty 2
  Filled 2022-07-25 (×2): qty 3
  Filled 2022-07-25 (×2): qty 2
  Filled 2022-07-25 (×6): qty 3
  Filled 2022-07-25: qty 2
  Filled 2022-07-25: qty 3
  Filled 2022-07-25: qty 2
  Filled 2022-07-25: qty 3
  Filled 2022-07-25: qty 2
  Filled 2022-07-25 (×3): qty 3
  Filled 2022-07-25: qty 2
  Filled 2022-07-25 (×7): qty 3
  Filled 2022-07-25: qty 2
  Filled 2022-07-25 (×14): qty 3

## 2022-07-25 MED ORDER — BISACODYL 5 MG PO TBEC: 5.0000 mg | DELAYED_RELEASE_TABLET | Freq: Every day | ORAL | Status: DC | PRN

## 2022-07-25 MED ORDER — APIXABAN 5 MG PO TABS
5.0000 mg | ORAL_TABLET | Freq: Two times a day (BID) | ORAL | Status: DC
  Administered 2022-07-25: 5 mg via ORAL
  Filled 2022-07-25: qty 1

## 2022-07-25 MED ORDER — NICOTINE 7 MG/24HR TD PT24: 7.0000 mg | MEDICATED_PATCH | Freq: Every day | TRANSDERMAL | 0 refills | Status: DC

## 2022-07-25 MED ORDER — MAGNESIUM HYDROXIDE 400 MG/5ML PO SUSP: 30.0000 mL | Freq: Every day | ORAL | Status: DC | PRN

## 2022-07-25 MED ORDER — INSULIN ASPART 100 UNIT/ML IJ SOLN
0.0000 [IU] | Freq: Three times a day (TID) | INTRAMUSCULAR | Status: DC
  Administered 2022-07-25 – 2022-07-26 (×3): 1 [IU] via SUBCUTANEOUS
  Administered 2022-07-26: 2 [IU] via SUBCUTANEOUS
  Administered 2022-07-27 – 2022-07-28 (×3): 1 [IU] via SUBCUTANEOUS
  Administered 2022-07-29: 2 [IU] via SUBCUTANEOUS
  Administered 2022-07-29: 1 [IU] via SUBCUTANEOUS
  Administered 2022-07-29: 2 [IU] via SUBCUTANEOUS
  Administered 2022-07-30 (×3): 1 [IU] via SUBCUTANEOUS
  Administered 2022-07-31: 2 [IU] via SUBCUTANEOUS
  Administered 2022-07-31: 1 [IU] via SUBCUTANEOUS
  Administered 2022-07-31: 2 [IU] via SUBCUTANEOUS
  Administered 2022-08-01 (×2): 1 [IU] via SUBCUTANEOUS
  Administered 2022-08-02 (×2): 2 [IU] via SUBCUTANEOUS
  Administered 2022-08-03: 1 [IU] via SUBCUTANEOUS
  Administered 2022-08-04: 2 [IU] via SUBCUTANEOUS
  Administered 2022-08-04 – 2022-08-05 (×2): 1 [IU] via SUBCUTANEOUS
  Administered 2022-08-06: 2 [IU] via SUBCUTANEOUS
  Administered 2022-08-06 – 2022-08-07 (×3): 1 [IU] via SUBCUTANEOUS

## 2022-07-25 MED ORDER — ZINC SULFATE 220 (50 ZN) MG PO CAPS
220.0000 mg | ORAL_CAPSULE | Freq: Every day | ORAL | Status: AC
  Administered 2022-07-26 – 2022-08-05 (×11): 220 mg via ORAL
  Filled 2022-07-25 (×11): qty 1

## 2022-07-25 MED ORDER — ZINC SULFATE 220 (50 ZN) MG PO CAPS: 220.0000 mg | ORAL_CAPSULE | Freq: Every day | ORAL | 0 refills | Status: DC

## 2022-07-25 MED ORDER — FENTANYL 75 MCG/HR TD PT72
1.0000 | MEDICATED_PATCH | TRANSDERMAL | Status: DC
  Administered 2022-07-28 – 2022-08-06 (×4): 1 via TRANSDERMAL
  Filled 2022-07-25 (×6): qty 1

## 2022-07-25 MED ORDER — METOPROLOL SUCCINATE ER 25 MG PO TB24
25.0000 mg | ORAL_TABLET | Freq: Three times a day (TID) | ORAL | Status: DC
  Administered 2022-07-25 – 2022-08-07 (×38): 25 mg via ORAL
  Filled 2022-07-25 (×39): qty 1

## 2022-07-25 MED ORDER — BISACODYL 5 MG PO TBEC: 5.0000 mg | DELAYED_RELEASE_TABLET | Freq: Every day | ORAL | 0 refills | Status: DC | PRN

## 2022-07-25 MED ORDER — FENTANYL 75 MCG/HR TD PT72
1.0000 | MEDICATED_PATCH | TRANSDERMAL | Status: DC
  Administered 2022-07-25: 1 via TRANSDERMAL
  Filled 2022-07-25: qty 1

## 2022-07-25 MED ORDER — METHOCARBAMOL 500 MG PO TABS
500.0000 mg | ORAL_TABLET | Freq: Four times a day (QID) | ORAL | Status: DC | PRN
  Administered 2022-07-25 – 2022-07-31 (×10): 500 mg via ORAL
  Filled 2022-07-25 (×12): qty 1

## 2022-07-25 MED ORDER — METHOCARBAMOL 500 MG PO TABS: 500.0000 mg | ORAL_TABLET | Freq: Four times a day (QID) | ORAL | Status: DC | PRN

## 2022-07-25 MED ORDER — ATORVASTATIN CALCIUM 80 MG PO TABS
80.0000 mg | ORAL_TABLET | Freq: Every day | ORAL | Status: DC
  Administered 2022-07-26 – 2022-08-07 (×13): 80 mg via ORAL
  Filled 2022-07-25 (×13): qty 1

## 2022-07-25 MED ORDER — ACETAMINOPHEN 325 MG PO TABS
325.0000 mg | ORAL_TABLET | ORAL | Status: DC | PRN
  Administered 2022-07-25: 650 mg via ORAL
  Administered 2022-07-26: 325 mg via ORAL
  Administered 2022-07-26 – 2022-08-01 (×8): 650 mg via ORAL
  Administered 2022-08-02: 325 mg via ORAL
  Administered 2022-08-02 – 2022-08-07 (×8): 650 mg via ORAL
  Filled 2022-07-25 (×6): qty 2
  Filled 2022-07-25: qty 1
  Filled 2022-07-25: qty 2
  Filled 2022-07-25: qty 1
  Filled 2022-07-25 (×6): qty 2
  Filled 2022-07-25: qty 1
  Filled 2022-07-25 (×6): qty 2

## 2022-07-25 MED ORDER — PROCHLORPERAZINE MALEATE 5 MG PO TABS: 5.0000 mg | ORAL_TABLET | Freq: Four times a day (QID) | ORAL | Status: DC | PRN

## 2022-07-25 MED ORDER — OXYCODONE HCL 5 MG PO TABS: 5.0000 mg | ORAL_TABLET | ORAL | 0 refills | Status: DC | PRN

## 2022-07-25 MED ORDER — ALUM & MAG HYDROXIDE-SIMETH 200-200-20 MG/5ML PO SUSP: 30.0000 mL | ORAL | Status: DC | PRN

## 2022-07-25 MED ORDER — CLOPIDOGREL BISULFATE 75 MG PO TABS
75.0000 mg | ORAL_TABLET | Freq: Every day | ORAL | Status: DC
  Administered 2022-07-26 – 2022-08-02 (×8): 75 mg via ORAL
  Filled 2022-07-25 (×8): qty 1

## 2022-07-25 MED ORDER — APIXABAN 5 MG PO TABS
5.0000 mg | ORAL_TABLET | Freq: Two times a day (BID) | ORAL | Status: DC
  Administered 2022-07-25 – 2022-08-02 (×17): 5 mg via ORAL
  Filled 2022-07-25 (×17): qty 1

## 2022-07-25 MED ORDER — DIPHENHYDRAMINE HCL 12.5 MG/5ML PO ELIX: 12.5000 mg | ORAL_SOLUTION | Freq: Four times a day (QID) | ORAL | Status: DC | PRN

## 2022-07-25 MED ORDER — DOCUSATE SODIUM 100 MG PO CAPS: 100.0000 mg | ORAL_CAPSULE | Freq: Every day | ORAL | 0 refills | Status: AC

## 2022-07-25 NOTE — H&P (Signed)
Physical Medicine and Rehabilitation Admission H&P     CC: Debility secondary to left foot osteomyelitis status post left below-knee amputation   HPI: Robert Burnett is a 61 year old male With a history of type 2 diabetes mellitus and osteomyelitis of the left foot.  He presented to [facility] emergency department on 07/22/2022 with complaints of worsening pain and swelling of his left foot.  He endorsed purulent drainage.  Previously undergone toe amputation and amputation Lisfranc and tarsal left foot, delayed secondary closure of surgical wound and Achilles tendon lengthening by Dr. Sherryle Lis on 03/28/2022.  His medical history is significant for coronary artery disease and underwent left heart catheterization was placed to the LAD by Dr. Daneen Schick on 817/2023.  He is maintained on Eliquis.  Podiatry was consulted and MRI of the left foot obtained.  He underwent left BKA by Dr. Sharol Given on 10/4.  He required 2 units packed red blood cells secondary to acute blood loss anemia atop anemia of chronic disease.  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  He underwent TURP as well.   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  He was under the care of Dr. [name] for pain management.  The patient states her practice is closed and he has a primary care appointment at [facility] on 10/16.  He had fentanyl patch 75 mcg/h #5 last prescribed on 8/18 by Dr.Fang Xu.   His primary complaint today is left residual limb pain, left thigh and hip pain and sternal and rib pain he attributes to the wet weather outside. The patient requires inpatient physical medicine and rehabilitation evaluations and treatment secondary to dysfunction due to left below-knee amputation and chronic pain.   Review of Systems  Constitutional:  Negative for chills and fever.  HENT:  Negative for congestion and sore throat.   Eyes:  Negative for  blurred vision and double vision.  Respiratory:  Negative for cough and shortness of breath.   Cardiovascular:  Negative for chest pain and palpitations.  Gastrointestinal:  Negative for constipation, nausea and vomiting.       Complains of sternal pain if his stomach gets full  Genitourinary:  Negative for dysuria and urgency.  Musculoskeletal:  Positive for back pain and joint pain.  Neurological:  Negative for dizziness and headaches.  Psychiatric/Behavioral:  Negative for depression. The patient does not have insomnia.         Past Medical History:  Diagnosis Date   Allergy     Anemia     Arthritis      ARTHRITIS IN SPINE - MAKES PT HAVE CHEST PAIN- USES fENTANYL PATCH    Asthma      IN TEENS    Bacteremia      a. 03/2022 Group B Strep bacteremia in setting of diabetic L foot infxn/gangrene/osteo.   Diabetes mellitus     Diabetic peripheral neuropathy associated with type 2 diabetes mellitus ([place]) 12/12/2011   Fatty liver disease, nonalcoholic 41/66/0630   Foot osteomyelitis, left ([place])      a. 2012 s/p toe amputations on L; b. 03/2022 - admitted w/ wet gangrene and necrotizing infxn w/ osteomyelitis-->s/p transmetatarsal followed by Lisfranc amputation.   GERD (gastroesophageal reflux disease)     H/O degenerative disc disease     History of echocardiogram      a. 03/2022 Echo: EF 55-60%, no rwma, nl RV  fxn, mildly elev PASP. Mild MR.   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[facility])           Past Surgical History:  Procedure Laterality Date   ACHILLES TENDON SURGERY Left 03/28/2022    Procedure: ACHILLES LENGTHENING/KIDNER;  Surgeon: [name], DPM;  Location: [facility];  Service: Podiatry;  Laterality: Left;   AMPUTATION Left 03/25/2022    Procedure: AMPUTATION DIGITS,TRANSMETARSAL AMPUTATION, REMOVAL OF TOES AND BONE BEHIND TOES ;  Surgeon: [name], DPM;  Location: [facility];  Service: Podiatry;   Laterality: Left;   AMPUTATION Left 07/23/2022    Procedure: LEFT BELOW KNEE AMPUTATION;  Surgeon: [name], MD;  Location: [place];  Service: Orthopedics;  Laterality: Left;   APPLICATION OF WOUND VAC Left 03/25/2022    Procedure: APPLICATION OF WOUND VAC;  Surgeon: [name], DPM;  Location: [facility];  Service: Podiatry;  Laterality: Left;   CORONARY STENT INTERVENTION N/A 06/05/2022    Procedure: CORONARY STENT INTERVENTION;  Surgeon: Belva Crome, MD;  Location: [facility];  Service: Cardiovascular;  Laterality: N/A;   CYSTOSCOPY/RETROGRADE/URETEROSCOPY Left 10/29/2020    Procedure: CYSTOSCOPY/RETROGRADE/ LEFT URETEROSCOPY WITH BIOPSY, LEFT URETERAL STENT;  Surgeon: Raynelle Bring, MD;  Location: [facility];  Service: Urology;  Laterality: Left;   IRRIGATION AND DEBRIDEMENT FOOT Left 03/28/2022    Procedure: AMPUTATION LISFRANC, SECONDARY WOUND CLOSURE;  Surgeon: [name], DPM;  Location: [facility];  Service: Podiatry;  Laterality: Left;   KNEE SURGERY Bilateral 1975 and 1989   LEFT HEART CATH AND CORONARY ANGIOGRAPHY N/A 06/05/2022    Procedure: LEFT HEART CATH AND CORONARY ANGIOGRAPHY;  Surgeon: Belva Crome, MD;  Location: [facility];  Service: Cardiovascular;  Laterality: N/A;   MULTIPLE TOOTH EXTRACTIONS       ROBOT ASSITED LAPAROSCOPIC NEPHROURETERECTOMY Left 12/13/2020    Procedure: XI ROBOT ASSITED LAPAROSCOPIC NEPHROURETERECTOMY , POST OPERATIVE INTRAVESICAL GEMCITABINE;  Surgeon: Raynelle Bring, MD;  Location: [facility];  Service: Urology;  Laterality: Left;  ONLY NEEDS 240 MIN   TOEM AMPUTATION  Left      2012, second toe   TRANSURETHRAL RESECTION OF BLADDER TUMOR N/A 08/29/2021    Procedure: TRANSURETHRAL RESECTION OF BLADDER TUMOR (TURBT) WITH CYSTOSCOPY;  Surgeon: Raynelle Bring, MD;  Location: [facility];  Service: Urology;  Laterality: N/A;  GENERAL ANESTHESIA WITH PARALYSIS   WISDOM TOOTH EXTRACTION             Family History  Problem Relation Age of  Onset   Cancer Mother     Hypertension Father     Diabetes Father     Lung cancer Father     Diabetes Sister     Cancer Sister      Social History:  reports that he has been smoking pipe and cigarettes. He has a 10.00 pack-year smoking history. He has quit using smokeless tobacco.  His smokeless tobacco use included chew. He reports that he does not drink alcohol and does not use drugs. Allergies:      Allergies  Allergen Reactions   Amlodipine Rash   Prozac [Fluoxetine Hcl] Rash          Medications Prior to Admission  Medication Sig Dispense Refill   antiseptic oral rinse (BIOTENE) LIQD 15 mLs by Mouth Rinse route as needed for dry mouth.  apixaban (ELIQUIS) 5 MG TABS tablet Take 1 tablet (5 mg total) by mouth 2 (two) times daily. 60 tablet 0   aspirin EC 81 MG tablet Take 1 tablet (81 mg total) by mouth daily. Swallow whole. For 30 days then stop 30 tablet 0   atorvastatin (LIPITOR) 80 MG tablet Take 80 mg by mouth daily.       clopidogrel (PLAVIX) 75 MG tablet Take 1 tablet (75 mg total) by mouth daily. 90 tablet 0   dapagliflozin propanediol (FARXIGA) 10 MG TABS tablet Take 1 tablet (10 mg total) by mouth daily before breakfast. 30 tablet 3   Diclofenac Sodium (VOLTAREN ARTHRITIS PAIN EX) Apply 1 application  topically daily as needed (pain relief).       ezetimibe (ZETIA) 10 MG tablet Take 1 tablet (10 mg total) by mouth daily. 30 tablet 0   fentaNYL (DURAGESIC) 75 MCG/HR Place 1 patch onto the skin every 3 (three) days. 5 patch 0   finasteride (PROSCAR) 5 MG tablet Take 5 mg by mouth daily.       furosemide (LASIX) 40 MG tablet Take 1 tablet (40 mg total) by mouth daily as needed for edema or fluid (Take 1 tablet if your weight increases more than 2 pounds in 24 hours.). 30 tablet 1   insulin NPH-regular Human (70-30) 100 UNIT/ML injection Inject 10 Units into the skin 2 (two) times daily with a meal. RELION BRAND PLEASE 10 mL 11   metoprolol succinate (TOPROL-XL) 25 MG 24  hr tablet Take 3 tablets (75 mg total) by mouth daily. (Patient taking differently: Take 25 mg by mouth 3 (three) times daily.) 90 tablet 0   nitroGLYCERIN (NITROSTAT) 0.4 MG SL tablet Place 1 tablet (0.4 mg total) under the tongue every 5 (five) minutes as needed for chest pain. 100 tablet 3   pantoprazole (PROTONIX) 40 MG tablet Take 1 tablet (40 mg total) by mouth daily. 30 tablet 0   polyethylene glycol (MIRALAX / GLYCOLAX) 17 g packet Take 17 g by mouth 2 (two) times daily. 14 each 0   potassium chloride (KLOR-CON M) 10 MEQ tablet Take 2 tablets (20 mEq total) by mouth daily as needed (Take the days  you need to  take lasix.). 30 tablet 0   FREESTYLE LITE test strip 1 each by Other route 3 (three) times daily. 100 each 11   Insulin Pen Needle (PEN NEEDLES 3/16") 31G X 5 MM MISC 28 Units by Does not apply route 2 (two) times daily. 200 each 10   Insulin Syringe-Needle U-100 (RELION INSULIN SYRINGE 1ML/31G) 31G X 5/16" 1 ML MISC 1 each by Does not apply route in the morning and at bedtime. Use to inject Insulin 70/30 60 each 11   Lancets (FREESTYLE) lancets 1 each by Other route 3 (three) times daily. 100 each 11   losartan (COZAAR) 50 MG tablet Take 1 tablet (50 mg total) by mouth daily. 30 tablet 5          Home: Home Living Family/patient expects to be discharged to:: Private residence Living Arrangements: Other relatives Available Help at Discharge: Family, Available PRN/intermittently Type of Home: House Home Access: Stairs to enter [employer] of Steps: small step and 6 inch step Entrance Stairs-Rails: None Home Layout: One level Bathroom Shower/Tub: Tub/shower unit, Curtain, Sponge bathes at baseline [employer]: Handicapped height Home Equipment: Conservation officer, nature (2 wheels), Wheelchair - manual, BSC/3in1, Crutches, Tub bench Additional Comments: nephew works   Functional History: Prior Function Prior Level  of Function : Independent/Modified Independent Mobility  Comments: recently using crutches to offweight L foot ulcer   Functional Status:  Mobility: Bed Mobility Overal bed mobility: Needs Assistance Bed Mobility: Supine to Sit, Sit to Supine Supine to sit: Supervision Sit to supine: Supervision Transfers Overall transfer level: Needs assistance Equipment used: Rolling walker (2 wheels) Transfers: Sit to/from Stand Sit to Stand: Min assist, +2 physical assistance General transfer comment: assist to power up into standing from very elevated surface. Cues for hand placement. Deferred further mobility due to elevated HR in 140s-150   ADL: ADL Overall ADL's : Needs assistance/impaired Eating/Feeding: Independent, Sitting Grooming: Wash/dry hands, Wash/dry face, Oral care, Set up, Sitting, Bed level Upper Body Bathing: Set up, Sitting Lower Body Bathing: Maximal assistance, Cueing for safety, Cueing for sequencing, Sit to/from stand Upper Body Dressing : Set up, Sitting, Bed level Lower Body Dressing: Maximal assistance, Cueing for safety, Cueing for sequencing, Sit to/from stand General ADL Comments: Pt limited in transfers at this time due to pain and HR   Cognition: Cognition Overall Cognitive Status: Within Functional Limits for tasks assessed Orientation Level: Oriented X4 Cognition Arousal/Alertness: Awake/alert Behavior During Therapy: WFL for tasks assessed/performed Overall Cognitive Status: Within Functional Limits for tasks assessed   Physical Exam: Blood pressure 121/74, pulse 67, temperature 98.4 F (36.9 C), temperature source Oral, resp. rate 16, height '6\' 1"'$  (1.854 m), weight 81.2 kg, SpO2 99 %.  Physical Exam Constitutional:      General: He is not in acute distress. HENT:     Head: Normocephalic and atraumatic.  Eyes:     Extraocular Movements: Extraocular movements intact.     Pupils: Pupils are equal, round, and reactive to light.  Cardiovascular:     Rate and Rhythm: Normal rate and regular rhythm.   Pulmonary:     Effort: Pulmonary effort is normal.     Breath sounds: Normal breath sounds.  Abdominal:     General: Bowel sounds are normal.     Palpations: Abdomen is soft.  Musculoskeletal:     Cervical back: Neck supple.     Comments: Active pressure dressing in place left residual limb with good seal.      LLE - 3+/5 hip flexion, limited by pain     RLE, BL UE - 5/5 throughout     + R foot resting plantarflexion tone Neurological:     Sensation: Decreased to light touch over right foot, ankle    Mental Status: He is alert and oriented to person, place, and time.  Psychiatric:        Mood and Affect: Mood normal.        Behavior: Behavior normal.        Lab Results Last 48 Hours        Results for orders placed or performed during the hospital encounter of 07/21/22 (from the past 48 hour(s))  Glucose, capillary     Status: Abnormal    Collection Time: 07/23/22 11:18 AM  Result Value Ref Range    Glucose-Capillary 127 (H) 70 - 99 mg/dL      Comment: Glucose reference range applies only to samples taken after fasting for at least 8 hours.  Glucose, capillary     Status: Abnormal    Collection Time: 07/23/22  1:54 PM  Result Value Ref Range    Glucose-Capillary 127 (H) 70 - 99 mg/dL      Comment: Glucose reference range applies only to samples taken after fasting for at least  8 hours.  CBC with Differential/Platelet     Status: Abnormal    Collection Time: 07/23/22  3:36 PM  Result Value Ref Range    WBC 9.9 4.0 - 10.5 K/uL    RBC 3.23 (L) 4.22 - 5.81 MIL/uL    Hemoglobin 8.5 (L) 13.0 - 17.0 g/dL    HCT 26.1 (L) 39.0 - 52.0 %    MCV 80.8 80.0 - 100.0 fL    MCH 26.3 26.0 - 34.0 pg    MCHC 32.6 30.0 - 36.0 g/dL    RDW 14.6 11.5 - 15.5 %    Platelets 183 150 - 400 K/uL    nRBC 0.0 0.0 - 0.2 %    Neutrophils Relative % 89 %    Neutro Abs 8.8 (H) 1.7 - 7.7 K/uL    Lymphocytes Relative 6 %    Lymphs Abs 0.6 (L) 0.7 - 4.0 K/uL    Monocytes Relative 4 %    Monocytes  Absolute 0.4 0.1 - 1.0 K/uL    Eosinophils Relative 0 %    Eosinophils Absolute 0.0 0.0 - 0.5 K/uL    Basophils Relative 0 %    Basophils Absolute 0.0 0.0 - 0.1 K/uL    Immature Granulocytes 1 %    Abs Immature Granulocytes 0.06 0.00 - 0.07 K/uL      Comment: Performed at [facility], [address], [place], [place] [address]  Glucose, capillary     Status: Abnormal    Collection Time: 07/23/22  5:18 PM  Result Value Ref Range    Glucose-Capillary 137 (H) 70 - 99 mg/dL      Comment: Glucose reference range applies only to samples taken after fasting for at least 8 hours.  Glucose, capillary     Status: Abnormal    Collection Time: 07/23/22  8:17 PM  Result Value Ref Range    Glucose-Capillary 175 (H) 70 - 99 mg/dL      Comment: Glucose reference range applies only to samples taken after fasting for at least 8 hours.  CBC     Status: Abnormal    Collection Time: 07/24/22  2:43 AM  Result Value Ref Range    WBC 8.1 4.0 - 10.5 K/uL    RBC 3.36 (L) 4.22 - 5.81 MIL/uL    Hemoglobin 9.1 (L) 13.0 - 17.0 g/dL    HCT 26.9 (L) 39.0 - 52.0 %    MCV 80.1 80.0 - 100.0 fL    MCH 27.1 26.0 - 34.0 pg    MCHC 33.8 30.0 - 36.0 g/dL    RDW 14.6 11.5 - 15.5 %    Platelets 211 150 - 400 K/uL    nRBC 0.0 0.0 - 0.2 %      Comment: Performed at [facility], [place] [address] [place], [place] [address]  Basic metabolic panel     Status: Abnormal    Collection Time: 07/24/22  2:43 AM  Result Value Ref Range    Sodium 128 (L) 135 - 145 mmol/L    Potassium 3.9 3.5 - 5.1 mmol/L    Chloride 99 98 - 111 mmol/L    CO2 20 (L) 22 - 32 mmol/L    Glucose, Bld 130 (H) 70 - 99 mg/dL      Comment: Glucose reference range applies only to samples taken after fasting for at least 8 hours.    BUN 24 (H) 8 - 23 mg/dL    Creatinine, Ser 1.25 (H) 0.61 -  1.24 mg/dL    Calcium 7.7 (L) 8.9 - 10.3 mg/dL    GFR, Estimated >60 >60 mL/min      Comment: (NOTE) Calculated using the CKD-EPI Creatinine Equation  (2021)      Anion gap 9 5 - 15      Comment: Performed at [facility] [address], [place], [place] [address]  Glucose, capillary     Status: Abnormal    Collection Time: 07/24/22  8:20 AM  Result Value Ref Range    Glucose-Capillary 142 (H) 70 - 99 mg/dL      Comment: Glucose reference range applies only to samples taken after fasting for at least 8 hours.  Glucose, capillary     Status: Abnormal    Collection Time: 07/24/22 12:01 PM  Result Value Ref Range    Glucose-Capillary 154 (H) 70 - 99 mg/dL      Comment: Glucose reference range applies only to samples taken after fasting for at least 8 hours.  Glucose, capillary     Status: Abnormal    Collection Time: 07/24/22  4:49 PM  Result Value Ref Range    Glucose-Capillary 139 (H) 70 - 99 mg/dL      Comment: Glucose reference range applies only to samples taken after fasting for at least 8 hours.  Glucose, capillary     Status: Abnormal    Collection Time: 07/24/22  9:34 PM  Result Value Ref Range    Glucose-Capillary 118 (H) 70 - 99 mg/dL      Comment: Glucose reference range applies only to samples taken after fasting for at least 8 hours.  CBC     Status: Abnormal    Collection Time: 07/25/22  2:17 AM  Result Value Ref Range    WBC 7.3 4.0 - 10.5 K/uL    RBC 3.39 (L) 4.22 - 5.81 MIL/uL    Hemoglobin 9.0 (L) 13.0 - 17.0 g/dL    HCT 27.2 (L) 39.0 - 52.0 %    MCV 80.2 80.0 - 100.0 fL    MCH 26.5 26.0 - 34.0 pg    MCHC 33.1 30.0 - 36.0 g/dL    RDW 14.9 11.5 - 15.5 %    Platelets 211 150 - 400 K/uL    nRBC 0.0 0.0 - 0.2 %      Comment: Performed at [facility], [place] [address], [place], [place] [address]  Basic metabolic panel     Status: Abnormal    Collection Time: 07/25/22  2:17 AM  Result Value Ref Range    Sodium 128 (L) 135 - 145 mmol/L    Potassium 3.7 3.5 - 5.1 mmol/L    Chloride 97 (L) 98 - 111 mmol/L    CO2 21 (L) 22 - 32 mmol/L    Glucose, Bld 111 (H) 70 - 99 mg/dL      Comment: Glucose  reference range applies only to samples taken after fasting for at least 8 hours.    BUN 21 8 - 23 mg/dL    Creatinine, Ser 1.09 0.61 - 1.24 mg/dL    Calcium 8.4 (L) 8.9 - 10.3 mg/dL    GFR, Estimated >60 >60 mL/min      Comment: (NOTE) Calculated using the CKD-EPI Creatinine Equation (2021)      Anion gap 10 5 - 15      Comment: Performed at [facility] [address] [place], [place] [address]  Glucose, capillary     Status: Abnormal  Collection Time: 07/25/22  7:59 AM  Result Value Ref Range    Glucose-Capillary 179 (H) 70 - 99 mg/dL      Comment: Glucose reference range applies only to samples taken after fasting for at least 8 hours.      Imaging Results (Last 48 hours)  No results found.         Blood pressure 121/74, pulse 67, temperature 98.4 F (36.9 C), temperature source Oral, resp. rate 16, height '6\' 1"'$  (1.854 m), weight 81.2 kg, SpO2 99 %.   Medical Problem List and Plan: 1. Functional deficits secondary to left below the knee amputation, osteoarthritis with chronic pain.             -patient may not shower             -ELOS/Goals: 7-10 days 2.  Antithrombotics: -DVT/anticoagulation:  Pharmaceutical: Eliquis             -antiplatelet therapy: Plavix 3. Pain Management (acute post-op, chronic chest/back): Fentanyl patch.  Oxycodone, Robaxin, Tylenol as needed             - Fentanyl patch 75 mcg/hr OP medication, per PDMP last filled 06/06/22 by Dr. Florencia Reasons [facility] (15 days supply)                       - Restarted inpatient 10/6                       - Of note, documented insurance denial for further patches 8/21 d/t no pain contract  4. Mood/Behavior/Sleep: LCSW to evaluate and provide emotional support             -antipsychotic agents: None  5. Neuropsych/cognition: This patient is capable of making decisions on his own behalf. 6. Skin/Wound Care: Routine skin care checks             -VAC dressing can come down 10/11  7.  Fluids/Electrolytes/Nutrition: Routine I's and O's             -continue carb modified diet             -continue vitamin C, zinc, Juven supplements 8: CAD/ Paroxysmal A fib: left heart cath 05/2022; stents to LAD             -Restarted on Eliquis 10/5             -continue Toprol XL 25 mg TID             - Intermittent tachycardia likely d/t poor pain control   9: T2DM c/b peripheral neuropathy: CBGs q AC and q HS             -continue Levemir 8 units daily             -continue Farxiga 10 mg daily             -SSI Recent Labs    07/24/22 2134 07/25/22 0759 07/25/22 1209  GLUCAP 118* 179* 181*     10: Hyperlipidemia: continue Zetia, Lipitor 11: Urothelial cancer s/p nephrectomy 12: Tobacco use: cessation counseling; continue nicotine patches 13: Anemia>chronic disease,acute blood loss: follow-up CBC 14: Hyponatremia: DC IVF at admission. Is/Os negative; follow-up BMP 15. Right foot plantarflexion tone: Order PRAFO on IPR admission.    Barbie Banner, PA-C 07/25/2022   I have examined the patient independently and edited the note for HPI, ROS, exam, assessment, and plan  as appropriate. I am in agreement with the above recommendations.   Gertie Gowda, DO 07/25/2022

## 2022-07-25 NOTE — Plan of Care (Signed)
  Problem: Coping: Goal: Ability to adjust to condition or change in health will improve Outcome: Progressing   Problem: Fluid Volume: Goal: Ability to maintain a balanced intake and output will improve Outcome: Progressing   

## 2022-07-25 NOTE — Progress Notes (Signed)
Physical Therapy Treatment Patient Details Name: Robert Burnett MRN: [id number] DOB: 1961-07-17 Today's Date: 07/25/2022   History of Present Illness 61 yr old male who presented 07/22/22 due to increase in  L foot pain with drainage and swelling for a week. 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 PMH: HTN, T2DM, fatty liver, HLD, hx of urothelial cancer, diabetic neuropathy, 6/23 diabetic foot infection and necrotizing infection and OA with transmetatarsal amputaion, NSTEMI    PT Comments    Patient with improved pain control this session but still experiencing pain in dependent position. Patient able to ambulate 10' with RW and minA. Fearful of falling and pain. Requesting to "go easy" as he will be transferring to rehab and explained that therapy will see him the next date but patient still requesting to limit therapy. Continue to recommend acute inpatient rehab (AIR) for post-acute therapy needs.    Recommendations for follow up therapy are one component of a multi-disciplinary discharge planning process, led by the attending physician.  Recommendations may be updated based on patient status, additional functional criteria and insurance authorization.  Follow Up Recommendations  Acute inpatient rehab (3hours/day)     Assistance Recommended at Discharge Frequent or constant Supervision/Assistance  Patient can return home with the following A lot of help with walking and/or transfers;A little help with bathing/dressing/bathroom;Assistance with cooking/housework;Assist for transportation;Help with stairs or ramp for entrance   Equipment Recommendations  Rolling [name] (2 wheels);BSC/3in1;Wheelchair (measurements PT);Wheelchair cushion (measurements PT)    Recommendations for Other Services Rehab consult     Precautions / Restrictions Precautions Precautions: Fall Precaution Comments: wound vac; watch HR Restrictions Weight Bearing Restrictions: Yes LLE Weight Bearing: Non weight  bearing     Mobility  Bed Mobility Overal bed mobility: Needs Assistance Bed Mobility: Supine to Sit, Sit to Supine     Supine to sit: Supervision Sit to supine: Supervision        Transfers Overall transfer level: Needs assistance Equipment used: Rolling [name] (2 wheels) Transfers: Sit to/from Stand Sit to Stand: Min assist           General transfer comment: assist to steady upon standing    Ambulation/Gait Ambulation/Gait assistance: Min assist Gait Distance (Feet): 10 Feet Assistive device: Rolling [name] (2 wheels) Gait Pattern/deviations:  (hop to) Gait velocity: decreased     General Gait Details: assist for balance. Cues for activity pacing as patient shaky with minimal mobility   Stairs             Wheelchair Mobility    Modified Rankin (Stroke Patients Only)       Balance Overall balance assessment: Needs assistance Sitting-balance support: No upper extremity supported, Feet supported Sitting balance-[name]: Good     Standing balance support: Bilateral upper extremity supported, Reliant on assistive device for balance Standing balance-[name]: Poor                              Cognition Arousal/Alertness: Awake/alert Behavior During Therapy: WFL for tasks assessed/performed Overall Cognitive Status: Within Functional Limits for tasks assessed                                          Exercises      General Comments General comments (skin integrity, edema, etc.): VSS on RA      Pertinent  Vitals/Pain Pain Assessment Pain Assessment: 0-10 Pain Score: 7  Pain Location: L residual limb Pain Descriptors / Indicators: Discomfort, Grimacing, Guarding Pain Intervention(s): Monitored during session    Home Living                          Prior Function            PT Goals (current goals can now be found in the care plan section) Acute Rehab PT Goals PT Goal Formulation: With  patient Time For Goal Achievement: 08/07/22 Potential to Achieve Goals: Good Progress towards PT goals: Progressing toward goals    Frequency    Min 3X/week      PT Plan Current plan remains appropriate    Co-evaluation              AM-PAC PT "6 Clicks" Mobility   Outcome Measure  Help needed turning from your back to your side while in a flat bed without using bedrails?: A Little Help needed moving from lying on your back to sitting on the side of a flat bed without using bedrails?: A Little Help needed moving to and from a bed to a chair (including a wheelchair)?: A Little Help needed standing up from a chair using your arms (e.g., wheelchair or bedside chair)?: A Little Help needed to walk in hospital room?: A Little Help needed climbing 3-5 steps with a railing? : Total 6 Click Score: 16    End of Session Equipment Utilized During Treatment: Gait belt Activity Tolerance: Patient tolerated treatment well Patient left: in bed;with call bell/phone within reach Nurse Communication: Mobility status PT Visit Diagnosis: Unsteadiness on feet (R26.81);Other abnormalities of gait and mobility (R26.89);Muscle weakness (generalized) (M62.81)     Time: 1638-4665 PT Time Calculation (min) (ACUTE ONLY): 22 min  Charges:  $Gait Training: 8-22 mins                     [name] A. Gilford Rile PT, DPT Acute Rehabilitation Services Office [phone]    [name] 07/25/2022, 4:42 PM

## 2022-07-25 NOTE — Discharge Summary (Signed)
Physician Discharge Summary  Robert Burnett JKD:[id number] DOB: 08/15/1961 DOA: 07/21/2022  PCP: Charlott Rakes, MD  Admit date: 07/21/2022 Discharge date: 07/25/2022  Admitted From: home Discharge disposition: CIR   Recommendations for Outpatient Follow-Up:   Follow BMP to ensure Na improving Follow CBC-- eliquis restarted on 10/6 May need pain clinic referral for fentanyl patch Adjust BP medications as needed   Discharge Diagnosis:   Principal Problem:   Foot osteomyelitis, left ([place]) Active Problems:   Type 2 diabetes mellitus with complication, with long-term current use of insulin (HCC)   Hyponatremia   CAD S/P percutaneous coronary angioplasty   Ischemic cardiomyopathy   Hypertension   PAF (paroxysmal atrial fibrillation) (HCC)   Normocytic anemia   Hyperlipidemia   CKD (chronic kidney disease) stage 2, GFR 60-89 ml/min   Tobacco abuse   Severe protein-calorie malnutrition (HCC)   Cutaneous abscess of left ankle   Cellulitis of left foot    Discharge Condition: Improved.  Diet recommendation:  Carbohydrate-modified  Wound care: None.  Code status: Full.   History of Present Illness:   Robert Burnett is a 61 y.o. male with medical history significant of  HTN, T2DM, fatty liver, HLD, hx of urothelial cancer, diabetic neuropathy who presented to ED with complaints of worsening left foot pain, foul drainage and swelling x 1 week. He has known diabetic ulcer of at base of metatarsal, present since at least July 2023. He states his left knee started to hurt from his arthritis and he wasn't able to really clean or dress his ulcer like he should due to inability to bend his leg. As the pain and foul smelling drainage increased he came to ED. He denies any fever/chills, N/V/D. He has had poor appetite over the past few days.    He was admitted in 03/2022 for diabetic foot infection with wet gangrene and necrotizing infection and osteomyelitis. S/p  transmetatarsal amputation of the left foot with I&D by podiatry on 03/25/22 then OR again on 6/9 with Lisfranc amputation. ABI normal.    Admitted again on 8/16 for NSTEMI s/p LHC with with stent to LAD on 8/17. Also had new PAF. triple therapy with ASA, plavix and eliquis x 1 months then plavix/eliquis. 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    He smokes a pipe and does not drink alcohol.    Hospital Course by Problem:   Foot osteomyelitis, left ([place]) 61 year old with known left foot diabetic ulcer presenting with worsening pain, foul smelling drainage and swelling x 1 week found to have  Osteomyelitis, 10.8cm multiloculated abscess in posterior hindfoot and concner for septic arthritis vs. OM of tibiotalar and posterior subtalar joints off MRI -podiatry initially consulted and recommended ortho.  -Dr. Sharol Given: BKA 10/4 -s/p vanc/zosyn for 24 hours post op- stop date added -pain medication with oxycodone -- resumed fentanyl patch for better pain control-- may need referral to pain clinic upon d/c from CIR -ABI normal in 03/2022 -will need wound vac for 1 week -PT/OT- CIR   Type 2 diabetes mellitus with complication, with long-term current use of insulin (HCC) Poorly controlled A1C in 03/2022 was 14.2 now 6.8 SSI and accucheck qac/hs Continue farxiga    Hyponatremia -stable -follow at CIR   CAD S/P percutaneous coronary angioplasty NSTEMI CAD s/p DES to proximal LAD 06/05/2022 Was on triple therapy with ASA, plavix and eliquis x1 month and has been continued on eliquis/plavix  Hold due to BKA plans for tomorrow,  but will need to start back at least plavix with history of recent DES less than 3 months ago.  Continue statin, toprol   Ischemic cardiomyopathy Euvolemic to dry Echo 05/2022: EF of 40-45%  Strict I/O Continue toprol and farxiga Cardiology considering entresto if renal function allows    PAF (paroxysmal atrial fibrillation) ([place]) New diagnosis from hospitalization in august  for NSTEMI ekg pending, NSR on exam Telemetry -resume eliquis-- verified that it was ok with ortho     Hypertension Continue metoprolol    Normocytic anemia Baseline 9-10 S/p 2 units PRBC with surgery     Hyperlipidemia Continue lipitor '80mg'$  and zetia '10mg'$  daily    CKD (chronic kidney disease) stage 2, GFR 60-89 ml/min Stable -outpatient follo wup   Tobacco abuse Smokes a pipe daily- nicotine patch      Medical Consultants:   ortho   Discharge Exam:   Vitals:   07/25/22 0322 07/25/22 0714  BP: 134/68 121/74  Pulse: 66 67  Resp: 17 16  Temp: 98.1 F (36.7 C) 98.4 F (36.9 C)  SpO2: 100% 99%   Vitals:   07/24/22 1648 07/24/22 2015 07/25/22 0322 07/25/22 0714  BP: 138/77 (!) 143/78 134/68 121/74  Pulse: 75 71 66 67  Resp: '19 16 17 16  '$ Temp: 97.8 F (36.6 C) 98.1 F (36.7 C) 98.1 F (36.7 C) 98.4 F (36.9 C)  TempSrc: Oral Oral Oral Oral  SpO2: 98% 100% 100% 99%  Weight:      Height:        General exam: Appears calm and comfortable.   The results of significant diagnostics from this hospitalization (including imaging, microbiology, ancillary and laboratory) are listed below for reference.     Procedures and Diagnostic Studies:   MR FOOT LEFT WO CONTRAST  Result Date: 07/22/2022 CLINICAL DATA:  Left foot ulcer and infection.  Prior amputation. EXAM: MRI OF THE LEFT FOOT WITHOUT CONTRAST TECHNIQUE: Multiplanar, multisequence MR imaging of the left foot was performed. No intravenous contrast was administered. COMPARISON:  Left foot x-rays from yesterday. MRI left foot dated March 25, 2022. FINDINGS: Bones/Joint/Cartilage Abnormal marrow edema involving the cuneiforms, navicular, and cuboid. Periarticular marrow edema involving the tibiotalar and posterior subtalar joints. Bony destruction of the medial and middle cuneiforms as well as the navicular. No fracture or dislocation. Small tibiotalar joint effusion. Ligaments Medial and lateral ankle ligaments  are intact. Muscles and Tendons High-grade partial tear of the anterior tibialis tendon. Achilles tendinosis. Soft tissue Deep soft tissue ulceration at the plantar aspect of the medial cuneiform, extending to bone. Large multiloculated fluid collection in the posterior hindfoot between the Achilles and distal tibia, measuring 9.3 x 4.6 x 10.8 cm. The fluid collection may communicate with the posterior ankle joint (series 3, image 17 and/or the flexor hallucis longus tendon sheath. There are few foci of air within the fluid collection. The fluid collection significantly displaces the posterior tibial neurovascular bundle posteriorly and medially. Diffuse circumferential soft tissue swelling and skin thickening. IMPRESSION: 1. Deep soft tissue ulceration at the plantar aspect of the medial cuneiform, extending to bone, with underlying osteomyelitis of the cuneiforms, cuboid, and navicular. 2. Large 10.8 cm multiloculated abscess in the posterior hindfoot between the Achilles and distal tibia. The abscess may communicate with the posterior ankle joint and/or the flexor hallucis longus tendon sheath. 3. Periarticular marrow edema involving the tibiotalar and posterior subtalar joints, with small tibiotalar joint effusion, concerning for septic arthritis and osteomyelitis. Electronically Signed   By: Gwyndolyn Saxon  Marzella Schlein M.D.   On: 07/22/2022 08:49   DG Foot Complete Left  Result Date: 07/21/2022 CLINICAL DATA:  Foul-smelling wound EXAM: LEFT FOOT - COMPLETE 3+ VIEW COMPARISON:  06/04/2022 FINDINGS: Transmetatarsal amputation is again identified and stable. Some generalized soft tissue swelling is noted in the foot ankle new from the prior exam. Increased lucency is noted in the residual first metatarsal and first cuneiform best noted on the frontal and lateral projections. These changes are consistent with progressive osteomyelitis. Soft tissue wound is noted along the plantar aspect consistent with the given clinical  history. IMPRESSION: Increased lucency in the first metatarsal remnant and first cuneiform consistent with progressive osteomyelitis. Electronically Signed   By: Inez Catalina M.D.   On: 07/21/2022 21:33     Labs:   Basic Metabolic Panel: Recent Labs  Lab 07/21/22 2055 07/22/22 0830 07/23/22 0617 07/24/22 0243 07/25/22 0217  NA 129* 131* 130* 128* 128*  K 3.8 4.1 4.0 3.9 3.7  CL 95* 93* 95* 99 97*  CO2 '22 23 23 '$ 20* 21*  GLUCOSE 171* 157* 121* 130* 111*  BUN '15 22 20 '$ 24* 21  CREATININE 1.25* 1.68* 1.32* 1.25* 1.09  CALCIUM 8.2* 9.0 8.5* 7.7* 8.4*   GFR Estimated Creatinine Clearance: 79.4 mL/min (by C-G formula based on SCr of 1.09 mg/dL). Liver Function Tests: Recent Labs  Lab 07/21/22 2055  AST 16  ALT 13  ALKPHOS 93  BILITOT 0.9  PROT 8.0  ALBUMIN 2.3*   No results for input(s): "LIPASE", "AMYLASE" in the last 168 hours. No results for input(s): "AMMONIA" in the last 168 hours. Coagulation profile No results for input(s): "INR", "PROTIME" in the last 168 hours.  CBC: Recent Labs  Lab 07/21/22 2055 07/22/22 0830 07/23/22 0617 07/23/22 1536 07/24/22 0243 07/25/22 0217  WBC 13.9* 11.8* 7.4 9.9 8.1 7.3  NEUTROABS 12.5*  --   --  8.8*  --   --   HGB 8.8* 9.9* 7.5* 8.5* 9.1* 9.0*  HCT 28.0* 30.5* 22.7* 26.1* 26.9* 27.2*  MCV 82.6 80.5 79.9* 80.8 80.1 80.2  PLT 247 281 158 183 211 211   Cardiac Enzymes: No results for input(s): "CKTOTAL", "CKMB", "CKMBINDEX", "TROPONINI" in the last 168 hours. BNP: Invalid input(s): "POCBNP" CBG: Recent Labs  Lab 07/24/22 0820 07/24/22 1201 07/24/22 1649 07/24/22 2134 07/25/22 0759  GLUCAP 142* 154* 139* 118* 179*   D-Dimer No results for input(s): "DDIMER" in the last 72 hours. Hgb A1c No results for input(s): "HGBA1C" in the last 72 hours. Lipid Profile No results for input(s): "CHOL", "HDL", "LDLCALC", "TRIG", "CHOLHDL", "LDLDIRECT" in the last 72 hours. Thyroid function studies No results for input(s):  "TSH", "T4TOTAL", "T3FREE", "THYROIDAB" in the last 72 hours.  Invalid input(s): "FREET3" Anemia work up No results for input(s): "VITAMINB12", "FOLATE", "FERRITIN", "TIBC", "IRON", "RETICCTPCT" in the last 72 hours. Microbiology Recent Results (from the past 240 hour(s))  Blood culture (routine x 2)     Status: None (Preliminary result)   Collection Time: 07/22/22  8:04 AM   Specimen: BLOOD LEFT ARM  Result Value Ref Range Status   Specimen Description BLOOD LEFT ARM  Final   Special Requests   Final    BOTTLES DRAWN AEROBIC AND ANAEROBIC Blood Culture results may not be optimal due to an inadequate volume of blood received in culture bottles   Culture   Final    NO GROWTH 3 DAYS Performed at [facility], [place] [address], [place],  [address]  Report Status PENDING  Incomplete  Blood culture (routine x 2)     Status: None (Preliminary result)   Collection Time: 07/22/22  8:09 AM   Specimen: BLOOD LEFT HAND  Result Value Ref Range Status   Specimen Description BLOOD LEFT HAND  Final   Special Requests   Final    BOTTLES DRAWN AEROBIC AND ANAEROBIC Blood Culture results may not be optimal due to an inadequate volume of blood received in culture bottles   Culture   Final    NO GROWTH 3 DAYS Performed at [facility], [place] [address] [place], [place] [address]    Report Status PENDING  Incomplete  Surgical pcr screen     Status: Abnormal   Collection Time: 07/23/22  3:45 AM   Specimen: Nasal Mucosa; Nasal Swab  Result Value Ref Range Status   MRSA, PCR NEGATIVE NEGATIVE Final   Staphylococcus aureus POSITIVE (A) NEGATIVE Final    Comment: (NOTE) The Xpert SA Assay (FDA approved for NASAL specimens in patients 40 years of age and older), is one component of a comprehensive surveillance program. It is not intended to diagnose infection nor to guide or monitor treatment. Performed at [facility], [place] [address], [place], [place] [address]       Discharge Instructions:   Discharge Instructions     Diet Carb Modified   Complete by: As directed    Discharge wound care:   Complete by: As directed    Wound vac per Dr. Sharol Given   Increase activity slowly   Complete by: As directed    Negative Pressure Wound Therapy - Incisional   Complete by: As directed       Allergies as of 07/25/2022       Reactions   Amlodipine Rash   Prozac [fluoxetine Hcl] Rash        Medication List     STOP taking these medications    Aspirin Low Dose 81 MG tablet Generic drug: aspirin EC   nitroGLYCERIN 0.4 MG SL tablet Commonly known as: Nitrostat       TAKE these medications    antiseptic oral rinse Liqd 15 mLs by Mouth Rinse route as needed for dry mouth.   atorvastatin 80 MG tablet Commonly known as: LIPITOR Take 80 mg by mouth daily.   bisacodyl 5 MG EC tablet Commonly known as: DULCOLAX Take 1 tablet (5 mg total) by mouth daily as needed for moderate constipation.   clopidogrel 75 MG tablet Commonly known as: PLAVIX Take 1 tablet (75 mg total) by mouth daily.   dapagliflozin propanediol 10 MG Tabs tablet Commonly known as: Farxiga Take 1 tablet (10 mg total) by mouth daily before breakfast.   docusate sodium 100 MG capsule Commonly known as: COLACE Take 1 capsule (100 mg total) by mouth daily. Start taking on: July 26, 2022   Eliquis 5 MG Tabs tablet Generic drug: apixaban Take 1 tablet (5 mg total) by mouth 2 (two) times daily.   ezetimibe 10 MG tablet Commonly known as: ZETIA Take 1 tablet (10 mg total) by mouth daily.   fentaNYL 75 MCG/HR Commonly known as: [place] 1 patch onto the skin every 3 (three) days.   finasteride 5 MG tablet Commonly known as: PROSCAR Take 5 mg by mouth daily.   freestyle lancets 1 each by Other route 3 (three) times daily.   FREESTYLE LITE test strip Generic drug: glucose blood 1 each by Other route 3 (three) times daily.  furosemide 40 MG  tablet Commonly known as: Lasix Take 1 tablet (40 mg total) by mouth daily as needed for edema or fluid (Take 1 tablet if your weight increases more than 2 pounds in 24 hours.).   insulin aspart 100 UNIT/ML injection Commonly known as: novoLOG Inject 0-9 Units into the skin 3 (three) times daily with meals.   insulin NPH-regular Human (70-30) 100 UNIT/ML injection Inject 10 Units into the skin 2 (two) times daily with a meal. RELION BRAND PLEASE   Insulin Syringe-Needle U-100 31G X 5/16" 1 ML Misc Commonly known as: RELION INSULIN SYRINGE 1ML/31G 1 each by Does not apply route in the morning and at bedtime. Use to inject Insulin 70/30   losartan 50 MG tablet Commonly known as: COZAAR Take 1 tablet (50 mg total) by mouth daily.   methocarbamol 500 MG tablet Commonly known as: ROBAXIN Take 1 tablet (500 mg total) by mouth every 6 (six) hours as needed for muscle spasms.   metoprolol succinate 25 MG 24 hr tablet Commonly known as: TOPROL-XL Take 3 tablets (75 mg total) by mouth daily. What changed:  how much to take when to take this   nicotine 7 mg/24hr patch Commonly known as: NICODERM CQ - dosed in mg/24 hr Place 1 patch (7 mg total) onto the skin daily. Start taking on: July 26, 2022   oxyCODONE 5 MG immediate release tablet Commonly known as: Oxy IR/ROXICODONE Take 1-2 tablets (5-10 mg total) by mouth every 4 (four) hours as needed for moderate pain (pain score 4-6).   pantoprazole 40 MG tablet Commonly known as: PROTONIX Take 1 tablet (40 mg total) by mouth daily.   Pen Needles 3/16" 31G X 5 MM Misc 28 Units by Does not apply route 2 (two) times daily.   polyethylene glycol 17 g packet Commonly known as: MIRALAX / GLYCOLAX Take 17 g by mouth 2 (two) times daily.   potassium chloride 10 MEQ tablet Commonly known as: KLOR-CON M Take 2 tablets (20 mEq total) by mouth daily as needed (Take the days  you need to  take lasix.).   VOLTAREN ARTHRITIS PAIN EX Apply  1 application  topically daily as needed (pain relief).   zinc sulfate 220 (50 Zn) MG capsule Take 1 capsule (220 mg total) by mouth daily for 10 days. Start taking on: July 26, 2022               Discharge Care Instructions  (From admission, onward)           Start     Ordered   07/25/22 0000  Discharge wound care:       Comments: Wound vac per Dr. Sharol Given   07/25/22 1054            Follow-up Information     [name], MD Follow up in 1 week(s).   Specialty: Orthopedic Surgery Contact information: [place] [address] [phone]         Charlott Rakes, MD Follow up in 1 week(s).   Specialty: Family Medicine Contact information: [place] [address] [phone]         Kate Sable, MD .   Specialties: Cardiology, Radiology Contact information: [place] [address] [phone]                  Time coordinating discharge: 45 min  Signed:  Geradine Girt DO  [facility] 07/25/2022, 10:54  AM

## 2022-07-25 NOTE — Progress Notes (Signed)
Gertie Gowda, DO  Physician Other PMR Pre-admission    Signed Date of Service:  07/25/2022 10:59 AM  Related encounter: ED to Hosp-Admission (Current) from 07/21/2022 in [place]      PMR Admission Coordinator Pre-Admission Assessment   Patient: Robert Burnett is an 61 y.o., male MRN: [id number] DOB: 02/12/1961 Height: '6\' 1"'  (185.4 cm) Weight: 81.2 kg   Insurance Information HMO: yes    PPO:      PCP:      IPA:      80/20:      OTHER:  PRIMARY: [employer]      Policy#: [id number]      Subscriber: pt CM Name: Theadora Rama      Phone#: [phone] ext [phone]     Fax#: [phone] Pre-Cert#: [id number] APPROVED FOR 7 DAYS   f/u with Risa Grill phone [phone] fax [phone]    Employer:  Benefits:  Phone #: [phone]     Name: 10/5 Eff. Date: 10/20/21     Deduct: $6800      Out of Pocket Max: $9100      Life Max: none CIR: 60%      SNF: 60% Outpatient: 60%     Co-Pay: visits per medical neccesity Home Health: 60%      Co-Pay: visits per medical neccesity DME: 60%     Co-Pay: 40% Providers: in network  SECONDARY: none   Financial Counselor:       Phone#:    The Engineer, [employer]" for patients in Inpatient Rehabilitation Facilities with attached "Privacy Act [place] Records" was provided and verbally reviewed with: N/A   Emergency Contact Information Contact Information       Name Relation Home Work Mobile    May,Alton Relative [phone]   [place] [phone]   [phone]    Roselyn Reef [phone]   [phone]    May,Cherice Relative [phone]             Current Medical History  Patient Admitting Diagnosis: BKA   History of Present Illness: 61 year old male with history of HTN, T2DM, fatty liver, HLD, urothelial cancer, Diabetic neuropathy who presented to ED on 07/21/22 with worsening left foot pain , foul drainage and swelling  for 1 week. History of admit 6/23 for diabetic foot infection with wet gangrene and necrotizing infection and osteomyelitis. S/P metatarsal amputation of the left foot and I and D by podiatry. 03/25/22. Also admitted 06/04/22 for NSTEMI s/p LHC with stent to LAD on 8/17.On triple therapy with ASA, Plavix and Eliquis. Also with ischemic CM with EF of 40 to 45%.   Podiatry initially consulted who recommended ortho consult by Dr Sharol Given. 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 Postoperative pain management and Vanc/Zosyn for 24 hrs. Will need wound Vac for 1 week. Poorly controlled DM with SSI , accucheck and continue farxiga. CAD to resume plavix, statin and Toprol. Strict I and Os for ischemic CM.    Patient's medical record from [facility] has been reviewed by the rehabilitation admission coordinator and physician.   Past Medical History      Past Medical History:  Diagnosis Date   Allergy     Anemia     Arthritis      ARTHRITIS IN SPINE - MAKES PT HAVE CHEST PAIN- USES fENTANYL PATCH    Asthma      IN TEENS    Bacteremia  a. 03/2022 Group B Strep bacteremia in setting of diabetic L foot infxn/gangrene/osteo.   Diabetes mellitus     Diabetic peripheral neuropathy associated with type 2 diabetes mellitus ([place]) 12/12/2011   Fatty liver disease, nonalcoholic 09/98/3382   Foot osteomyelitis, left ([place])      a. 2012 s/p toe amputations on L; b. 03/2022 - admitted w/ wet gangrene and necrotizing infxn w/ osteomyelitis-->s/p transmetatarsal followed by Lisfranc amputation.   GERD (gastroesophageal reflux disease)     H/O degenerative disc disease     History of echocardiogram      a. 03/2022 Echo: EF 55-60%, no rwma, nl RV fxn, mildly elev PASP. Mild MR.   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([place])      Has the patient had major surgery during 100 days prior to admission? Yes   Family History   family history includes Cancer in his mother and  sister; Diabetes in his father and sister; Hypertension in his father; Lung cancer in his father.   Current Medications   Current Facility-Administered Medications:    0.9 %  sodium chloride infusion, , Intravenous, Continuous, [name], MD, Last Rate: 10 mL/hr at 07/24/22 2153, New Bag at 07/24/22 2153   acetaminophen (TYLENOL) tablet 650 mg, 650 mg, Oral, Q6H PRN, 650 mg at 07/25/22 0040 **OR** acetaminophen (TYLENOL) suppository 650 mg, 650 mg, Rectal, Q6H PRN, [name], MD   alum & mag hydroxide-simeth (MAALOX/MYLANTA) 200-200-20 MG/5ML suspension 15-30 mL, 15-30 mL, Oral, Q2H PRN, [name], MD   apixaban Arne Cleveland) tablet 5 mg, 5 mg, Oral, BID, Vann, Jessica U, DO, 5 mg at 07/25/22 1140   ascorbic acid (VITAMIN C) tablet 1,000 mg, 1,000 mg, Oral, Daily, [name], MD, 1,000 mg at 07/25/22 0905   atorvastatin (LIPITOR) tablet 80 mg, 80 mg, Oral, Daily, [name], MD, 80 mg at 07/25/22 0905   bisacodyl (DULCOLAX) EC tablet 5 mg, 5 mg, Oral, Daily PRN, [name], MD   Chlorhexidine Gluconate Cloth 2 % PADS 6 each, 6 each, Topical, Q0600, [name], MD, 6 each at 07/25/22 0908   clopidogrel (PLAVIX) tablet 75 mg, 75 mg, Oral, Daily, [name], MD, 75 mg at 07/25/22 0905   dapagliflozin propanediol (FARXIGA) tablet 10 mg, 10 mg, Oral, QAC breakfast, [name], MD, 10 mg at 07/25/22 5053   docusate sodium (COLACE) capsule 100 mg, 100 mg, Oral, Daily, [name], MD, 100 mg at 07/25/22 9767   ezetimibe (ZETIA) tablet 10 mg, 10 mg, Oral, Daily, [name], MD, 10 mg at 07/25/22 0905   fentaNYL (DURAGESIC) 75 MCG/HR 1 patch, 1 patch, Transdermal, Q72H, Vann, Jessica U, DO, 1 patch at 07/25/22 1140   guaiFENesin-dextromethorphan (ROBITUSSIN DM) 100-10 MG/5ML syrup 15 mL, 15 mL, Oral, Q4H PRN, [name], MD   hydrALAZINE (APRESOLINE) injection 5 mg, 5 mg, Intravenous, Q20 Min PRN, [name], MD   HYDROmorphone (DILAUDID) injection 1 mg, 1  mg, Intravenous, Q3H PRN, Vann, Jessica U, DO   insulin aspart (novoLOG) injection 0-9 Units, 0-9 Units, Subcutaneous, TID WC, [name], MD, 2 Units at 07/25/22 0811   insulin detemir (LEVEMIR) injection 8 Units, 8 Units, Subcutaneous, QHS, [name], MD, 8 Units at 07/24/22 2156   labetalol (NORMODYNE) injection 10 mg, 10 mg, Intravenous,  Q10 min PRN, [name], MD   magnesium citrate solution 1 Bottle, 1 Bottle, Oral, Once PRN, [name], MD   magnesium sulfate IVPB 2 g 50 mL, 2 g, Intravenous, Daily PRN, [name], MD   methocarbamol (ROBAXIN) tablet 500 mg, 500 mg, Oral, Q6H PRN, Vann, Jessica U, DO, 500 mg at 07/25/22 0811   metoprolol succinate (TOPROL-XL) 24 hr tablet 25 mg, 25 mg, Oral, TID, [name], MD, 25 mg at 07/25/22 0908   mupirocin ointment (BACTROBAN) 2 % 1 Application, 1 Application, Nasal, BID, [name], MD, 1 Application at 25/63/89 0908   nicotine (NICODERM CQ - dosed in mg/24 hr) patch 7 mg, 7 mg, Transdermal, Daily, [name], MD, 7 mg at 07/25/22 0815   nutrition supplement (JUVEN) (JUVEN) powder packet 1 packet, 1 packet, Oral, BID BM, [name], MD, 1 packet at 07/25/22 0905   ondansetron (ZOFRAN) injection 4 mg, 4 mg, Intravenous, Q6H PRN, [name], MD   oxyCODONE (Oxy IR/ROXICODONE) immediate release tablet 10-15 mg, 10-15 mg, Oral, Q4H PRN, [name], MD, 15 mg at 07/25/22 3734   oxyCODONE (Oxy IR/ROXICODONE) immediate release tablet 5 mg, 5 mg, Oral, Q4H PRN, [name], MD, 5 mg at 07/22/22 1543   oxyCODONE (Oxy IR/ROXICODONE) immediate release tablet 5-10 mg, 5-10 mg, Oral, Q4H PRN, [name], MD   pantoprazole (PROTONIX) EC tablet 40 mg, 40 mg, Oral, Daily, [name], MD, 40 mg at 07/25/22 0905   phenol (CHLORASEPTIC) mouth spray 1 spray, 1 spray, Mouth/Throat, PRN, [name], MD   polyethylene glycol (MIRALAX / GLYCOLAX) packet 17 g, 17 g, Oral, Daily PRN, [name], MD   potassium  chloride SA (KLOR-CON M) CR tablet 20-40 mEq, 20-40 mEq, Oral, Daily PRN, [name], MD   zinc sulfate capsule 220 mg, 220 mg, Oral, Daily, [name], MD, 220 mg at 07/25/22 2876   Patients Current Diet:  Diet Order                  Diet Carb Modified             Diet Carb Modified Fluid consistency: Thin; Room service appropriate? Yes  Diet effective now                       Precautions / Restrictions Precautions Precautions: Fall Precaution Comments: wound vac; watch HR Restrictions Weight Bearing Restrictions: Yes LLE Weight Bearing: Non weight bearing    Has the patient had 2 or more falls or a fall with injury in the past year? No   Prior Activity Level Community (5-7x/wk): Mod I with crutches   Prior Functional Level Self Care: Did the patient need help bathing, dressing, using the toilet or eating? Independent   Indoor Mobility: Did the patient need assistance with walking from room to room (with or without device)? Independent   Stairs: Did the patient need assistance with internal or external stairs (with or without device)? Independent   Functional Cognition: Did the patient need help planning regular tasks such as shopping or remembering to take medications? Independent   Patient Information Are you of Hispanic, Latino/a,or Spanish origin?: A. No, not of Hispanic, Latino/a, or Spanish origin What is your race?: A. White Do you need or want an interpreter to communicate with a doctor or health care staff?: 0. No   Patient's Response To:  Health Literacy and  Transportation Is the patient able to respond to health literacy and transportation needs?: Yes Health Literacy - How often do you need to have someone help you when you read instructions, pamphlets, or other written material from your doctor or pharmacy?: Never In the past 12 months, has lack of transportation kept you from medical appointments or from getting medications?: No In the past 12  months, has lack of transportation kept you from meetings, work, or from getting things needed for daily living?: No   Home Assistive Devices / Equipment Home Equipment: Conservation officer, nature (2 wheels), Wheelchair - manual, BSC/3in1, Crutches, Tub bench   Prior Device Use: Indicate devices/aids used by the patient prior to current illness, exacerbation or injury?  crutches   Current Functional Level Cognition   Overall Cognitive Status: Within Functional Limits for tasks assessed Orientation Level: Oriented X4    Extremity Assessment (includes Sensation/Coordination)   Upper Extremity Assessment: Overall WFL for tasks assessed  Lower Extremity Assessment: Defer to PT evaluation LLE Deficits / Details: post op pain and weakness     ADLs   Overall ADL's : Needs assistance/impaired Eating/Feeding: Independent, Sitting Grooming: Wash/dry hands, Wash/dry face, Oral care, Set up, Sitting, Bed level Upper Body Bathing: Set up, Sitting Lower Body Bathing: Maximal assistance, Cueing for safety, Cueing for sequencing, Sit to/from stand Upper Body Dressing : Set up, Sitting, Bed level Lower Body Dressing: Maximal assistance, Cueing for safety, Cueing for sequencing, Sit to/from stand General ADL Comments: Pt limited in transfers at this time due to pain and HR     Mobility   Overal bed mobility: Needs Assistance Bed Mobility: Supine to Sit, Sit to Supine Supine to sit: Supervision Sit to supine: Supervision     Transfers   Overall transfer level: Needs assistance Equipment used: Rolling walker (2 wheels) Transfers: Sit to/from Stand Sit to Stand: Min assist, +2 physical assistance General transfer comment: assist to power up into standing from very elevated surface. Cues for hand placement. Deferred further mobility due to elevated HR in 140s-150     Ambulation / Gait / Stairs / Proofreader / Balance Balance Overall balance assessment: Needs  assistance Sitting-balance support: No upper extremity supported, Feet supported Sitting balance-[name]: Good Standing balance support: Bilateral upper extremity supported, Reliant on assistive device for balance Standing balance-[name]: Poor     Special needs/care consideration      Previous Home Environment  Living Arrangements:  (Nephew lives with him, [name])  Lives With: Other (Comment) (nephew lives with him) Available Help at Discharge:  [name] works days) Type of Home: [place]: One level Home Access: Stairs to enter Entrance Stairs-Rails: None Entrance Stairs-Number of Steps: small step and 6 inch step Bathroom Shower/Tub: Tub/shower unit, Curtain, Sponge bathes at baseline [employer]: Handicapped height Bathroom Accessibility: Yes How Accessible: Accessible via walker Home Care Services: No Additional Comments: nephew works   Discharge Living Setting Plans for Discharge Living Setting: Patient's home (nephew lives with him) Type of Home at Discharge: House Discharge Home Layout: One level Discharge Home Access: Stairs to enter Entrance Stairs-Rails: None Entrance Stairs-Number of Steps: small step and 6 inch step Discharge Bathroom Shower/Tub: Tub/shower unit Discharge Bathroom Toilet: Handicapped height Discharge Bathroom Accessibility: Yes How Accessible: Accessible via walker Does the patient have any problems obtaining your medications?: No   Social/Family/Support Systems Contact Information: [name] first call then his nephew, [name] Anticipated Caregiver: friends prn Anticipated Ambulance person Information:  see contacts Ability/Limitations of Caregiver: Nephew works days Caregiver Availability: Intermittent Discharge Plan Discussed with Primary Caregiver: Yes Is Caregiver In Agreement with Plan?: Yes Does Caregiver/Family have Issues with Lodging/Transportation while Pt is in Rehab?: No   Goals Patient/Family Goal for Rehab: Mod I  with PT and OT Expected length of stay: ELOS 7 to 10 days Pt/Family Agrees to Admission and willing to participate: Yes Program Orientation Provided & Reviewed with Pt/Caregiver Including Roles  & Responsibilities: Yes   Decrease burden of Care through IP rehab admission: n/a   Possible need for SNF placement upon discharge: not anticipated   Patient Condition: I have reviewed medical records from [facility], spoken with CM, and patient. I met with patient at the bedside for inpatient rehabilitation assessment.  Patient will benefit from ongoing PT and OT, can actively participate in 3 hours of therapy a day 5 days of the week, and can make measurable gains during the admission.  Patient will also benefit from the coordinated team approach during an Inpatient Acute Rehabilitation admission.  The patient will receive intensive therapy as well as Rehabilitation physician, nursing, social worker, and care management interventions.  Due to bladder management, bowel management, safety, skin/wound care, disease management, medication administration, pain management, and patient education the patient requires 24 hour a day rehabilitation nursing.  The patient is currently mod assist overall with mobility and basic ADLs.  Discharge setting and therapy post discharge at home with home health is anticipated.  Patient has agreed to participate in the Acute Inpatient Rehabilitation Program and will admit today.   Preadmission Screen Completed By:  Cleatrice Burke, 07/25/2022 11:53 AM ______________________________________________________________________   Discussed status with Dr. Tressa Busman on 07/25/22 at 1149 and received approval for admission today.   Admission Coordinator:  Cleatrice Burke, RN, time 4709 Date 07/25/22    Assessment/Plan: Diagnosis: Does the need for close, 24 hr/day Medical supervision in concert with the patient's rehab needs make it unreasonable for this patient to be  served in a less intensive setting? Yes Co-Morbidities requiring supervision/potential complications: CKD stage 2, hyponatremia, a fib with RVR, HTN, pain control, post-op wound care Due to safety, skin/wound care, disease management, medication administration, pain management, and patient education, does the patient require 24 hr/day rehab nursing? Yes Does the patient require coordinated care of a physician, rehab nurse, PT, OT to address physical and functional deficits in the context of the above medical diagnosis(es)? Yes Addressing deficits in the following areas: balance, endurance, locomotion, strength, transferring, bathing, dressing, and toileting Can the patient actively participate in an intensive therapy program of at least 3 hrs of therapy 5 days a week? Yes The potential for patient to make measurable gains while on inpatient rehab is excellent Anticipated functional outcomes upon discharge from inpatient rehab: supervision PT, independent OT Estimated rehab length of stay to reach the above functional goals is: 7-10 days Anticipated discharge destination: Home 10. Overall Rehab/Functional Prognosis: excellent     MD Signature:     Gertie Gowda, DO 07/25/2022          Revision History                                Note Details  Author Gertie Gowda, DO File Time 07/25/2022 12:19 PM  Author Type Physician Status Signed  Last Editor Gertie Gowda, DO Service Other

## 2022-07-25 NOTE — Progress Notes (Signed)
Gertie Gowda, DO  Physician Other PMR Pre-admission    Signed Date of Service:  07/25/2022 10:59 AM   Signed      PMR Admission Coordinator Pre-Admission Assessment   Patient: Robert Burnett is an 61 y.o., male MRN: [id number] DOB: [birth year] Height: '6\' 1"'  (185.4 cm) Weight: 81.2 kg   Insurance Information HMO: yes    PPO:      PCP:      IPA:      80/20:      OTHER:  PRIMARY: [employer]      Policy#: [id number]      Subscriber: pt CM Name: Theadora Rama      Phone#: [phone]     Fax#: [phone] Pre-Cert#: [id number] APPROVED FOR 7 DAYS   f/u with Risa Grill phone [phone] fax [phone]    Employer:  Benefits:  Phone #: [phone]     Name: 10/5 Eff. Date: 10/20/21     Deduct: $6800      Out of Pocket Max: $9100      Life Max: none CIR: 60%      SNF: 60% Outpatient: 60%     Co-Pay: visits per medical neccesity Home Health: 60%      Co-Pay: visits per medical neccesity DME: 60%     Co-Pay: 40% Providers: in network  SECONDARY: none   Financial Counselor:       Phone#:    The Engineer, [employer]" for patients in Inpatient Rehabilitation Facilities with attached "Privacy Act [place] Records" was provided and verbally reviewed with: N/A   Emergency Contact Information Contact Information       Name Relation Home Work Mobile    May,Alton Relative [phone]   [place] [phone]   [phone]    Roselyn Reef [phone]   [phone]    May,Cherice Relative [phone]             Current Medical History  Patient Admitting Diagnosis: BKA   History of Present Illness: 61 year old male with history of HTN, T2DM, fatty liver, HLD, urothelial cancer, Diabetic neuropathy who presented to ED on 07/21/22 with worsening left foot pain , foul drainage and swelling for 1 week. History of admit 6/23 for diabetic foot infection with wet gangrene and necrotizing infection and  osteomyelitis. S/P metatarsal amputation of the left foot and I and D by podiatry. 03/25/22. Also admitted 06/04/22 for NSTEMI s/p LHC with stent to LAD on 8/17.On triple therapy with ASA, Plavix and Eliquis. Also with ischemic CM with EF of 40 to 45%.   Podiatry initially consulted who recommended ortho consult by Dr Sharol Given. 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 Postoperative pain management and Vanc/Zosyn for 24 hrs. Will need wound Vac for 1 week. Poorly controlled DM with SSI , accucheck and continue farxiga. CAD to resume plavix, statin and Toprol. Strict I and Os for ischemic CM.    Patient's medical record from [facility] has been reviewed by the rehabilitation admission coordinator and physician.   Past Medical History      Past Medical History:  Diagnosis Date   Allergy     Anemia     Arthritis      ARTHRITIS IN SPINE - MAKES PT HAVE CHEST PAIN- USES fENTANYL PATCH    Asthma      IN TEENS    Bacteremia      a. 03/2022 Group B Strep bacteremia in setting of diabetic L foot infxn/gangrene/osteo.  Diabetes mellitus     Diabetic peripheral neuropathy associated with type 2 diabetes mellitus ([place]) 12/12/2011   Fatty liver disease, nonalcoholic 26/37/8588   Foot osteomyelitis, left ([place])      a. 2012 s/p toe amputations on L; b. 03/2022 - admitted w/ wet gangrene and necrotizing infxn w/ osteomyelitis-->s/p transmetatarsal followed by Lisfranc amputation.   GERD (gastroesophageal reflux disease)     H/O degenerative disc disease     History of echocardiogram      a. 03/2022 Echo: EF 55-60%, no rwma, nl RV fxn, mildly elev PASP. Mild MR.   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([place])      Has the patient had major surgery during 100 days prior to admission? Yes   Family History   family history includes Cancer in his mother and sister; Diabetes in his father and sister; Hypertension in his father; Lung cancer in his father.   Current  Medications   Current Facility-Administered Medications:    0.9 %  sodium chloride infusion, , Intravenous, Continuous, [name], MD, Last Rate: 10 mL/hr at 07/24/22 2153, New Bag at 07/24/22 2153   acetaminophen (TYLENOL) tablet 650 mg, 650 mg, Oral, Q6H PRN, 650 mg at 07/25/22 0040 **OR** acetaminophen (TYLENOL) suppository 650 mg, 650 mg, Rectal, Q6H PRN, [name], MD   alum & mag hydroxide-simeth (MAALOX/MYLANTA) 200-200-20 MG/5ML suspension 15-30 mL, 15-30 mL, Oral, Q2H PRN, [name], MD   apixaban Arne Cleveland) tablet 5 mg, 5 mg, Oral, BID, Vann, Jessica U, DO, 5 mg at 07/25/22 1140   ascorbic acid (VITAMIN C) tablet 1,000 mg, 1,000 mg, Oral, Daily, [name], MD, 1,000 mg at 07/25/22 0905   atorvastatin (LIPITOR) tablet 80 mg, 80 mg, Oral, Daily, [name], MD, 80 mg at 07/25/22 0905   bisacodyl (DULCOLAX) EC tablet 5 mg, 5 mg, Oral, Daily PRN, [name], MD   Chlorhexidine Gluconate Cloth 2 % PADS 6 each, 6 each, Topical, Q0600, [name], MD, 6 each at 07/25/22 0908   clopidogrel (PLAVIX) tablet 75 mg, 75 mg, Oral, Daily, [name], MD, 75 mg at 07/25/22 0905   dapagliflozin propanediol (FARXIGA) tablet 10 mg, 10 mg, Oral, QAC breakfast, [name], MD, 10 mg at 07/25/22 5027   docusate sodium (COLACE) capsule 100 mg, 100 mg, Oral, Daily, [name], MD, 100 mg at 07/25/22 7412   ezetimibe (ZETIA) tablet 10 mg, 10 mg, Oral, Daily, [name], MD, 10 mg at 07/25/22 0905   fentaNYL (DURAGESIC) 75 MCG/HR 1 patch, 1 patch, Transdermal, Q72H, Vann, Jessica U, DO, 1 patch at 07/25/22 1140   guaiFENesin-dextromethorphan (ROBITUSSIN DM) 100-10 MG/5ML syrup 15 mL, 15 mL, Oral, Q4H PRN, [name], MD   hydrALAZINE (APRESOLINE) injection 5 mg, 5 mg, Intravenous, Q20 Min PRN, [name], MD   HYDROmorphone (DILAUDID) injection 1 mg, 1 mg, Intravenous, Q3H PRN, Vann, Jessica U, DO   insulin aspart (novoLOG) injection 0-9 Units, 0-9 Units,  Subcutaneous, TID WC, [name], MD, 2 Units at 07/25/22 0811   insulin detemir (LEVEMIR) injection 8 Units, 8 Units, Subcutaneous, QHS, [name], MD, 8 Units at 07/24/22 2156   labetalol (NORMODYNE) injection 10 mg, 10 mg, Intravenous, Q10 min PRN, [name], MD   magnesium citrate solution 1 Bottle, 1  Bottle, Oral, Once PRN, [name], MD   magnesium sulfate IVPB 2 g 50 mL, 2 g, Intravenous, Daily PRN, [name], MD   methocarbamol (ROBAXIN) tablet 500 mg, 500 mg, Oral, Q6H PRN, Vann, Jessica U, DO, 500 mg at 07/25/22 0811   metoprolol succinate (TOPROL-XL) 24 hr tablet 25 mg, 25 mg, Oral, TID, [name], MD, 25 mg at 07/25/22 0908   mupirocin ointment (BACTROBAN) 2 % 1 Application, 1 Application, Nasal, BID, [name], MD, 1 Application at 54/62/70 0908   nicotine (NICODERM CQ - dosed in mg/24 hr) patch 7 mg, 7 mg, Transdermal, Daily, [name], MD, 7 mg at 07/25/22 0815   nutrition supplement (JUVEN) (JUVEN) powder packet 1 packet, 1 packet, Oral, BID BM, [name], MD, 1 packet at 07/25/22 0905   ondansetron (ZOFRAN) injection 4 mg, 4 mg, Intravenous, Q6H PRN, [name], MD   oxyCODONE (Oxy IR/ROXICODONE) immediate release tablet 10-15 mg, 10-15 mg, Oral, Q4H PRN, [name], MD, 15 mg at 07/25/22 3500   oxyCODONE (Oxy IR/ROXICODONE) immediate release tablet 5 mg, 5 mg, Oral, Q4H PRN, [name], MD, 5 mg at 07/22/22 1543   oxyCODONE (Oxy IR/ROXICODONE) immediate release tablet 5-10 mg, 5-10 mg, Oral, Q4H PRN, [name], MD   pantoprazole (PROTONIX) EC tablet 40 mg, 40 mg, Oral, Daily, [name], MD, 40 mg at 07/25/22 0905   phenol (CHLORASEPTIC) mouth spray 1 spray, 1 spray, Mouth/Throat, PRN, [name], MD   polyethylene glycol (MIRALAX / GLYCOLAX) packet 17 g, 17 g, Oral, Daily PRN, [name], MD   potassium chloride SA (KLOR-CON M) CR tablet 20-40 mEq, 20-40 mEq, Oral, Daily PRN, [name], MD   zinc  sulfate capsule 220 mg, 220 mg, Oral, Daily, [name], MD, 220 mg at 07/25/22 9381   Patients Current Diet:  Diet Order                  Diet Carb Modified             Diet Carb Modified Fluid consistency: Thin; Room service appropriate? Yes  Diet effective now                       Precautions / Restrictions Precautions Precautions: Fall Precaution Comments: wound vac; watch HR Restrictions Weight Bearing Restrictions: Yes LLE Weight Bearing: Non weight bearing    Has the patient had 2 or more falls or a fall with injury in the past year? No   Prior Activity Level Community (5-7x/wk): Mod I with crutches   Prior Functional Level Self Care: Did the patient need help bathing, dressing, using the toilet or eating? Independent   Indoor Mobility: Did the patient need assistance with walking from room to room (with or without device)? Independent   Stairs: Did the patient need assistance with internal or external stairs (with or without device)? Independent   Functional Cognition: Did the patient need help planning regular tasks such as shopping or remembering to take medications? Independent   Patient Information Are you of Hispanic, Latino/a,or Spanish origin?: A. No, not of Hispanic, Latino/a, or Spanish origin What is your race?: A. White Do you need or want an interpreter to communicate with a doctor or health care staff?: 0. No   Patient's Response To:  Health Literacy and Transportation Is the patient able to respond to health literacy and transportation needs?: Yes Health  Literacy - How often do you need to have someone help you when you read instructions, pamphlets, or other written material from your doctor or pharmacy?: Never In the past 12 months, has lack of transportation kept you from medical appointments or from getting medications?: No In the past 12 months, has lack of transportation kept you from meetings, work, or from getting things needed for  daily living?: No   Home Assistive Devices / Equipment Home Equipment: Conservation officer, nature (2 wheels), Wheelchair - manual, BSC/3in1, Crutches, Tub bench   Prior Device Use: Indicate devices/aids used by the patient prior to current illness, exacerbation or injury?  crutches   Current Functional Level Cognition   Overall Cognitive Status: Within Functional Limits for tasks assessed Orientation Level: Oriented X4    Extremity Assessment (includes Sensation/Coordination)   Upper Extremity Assessment: Overall WFL for tasks assessed  Lower Extremity Assessment: Defer to PT evaluation LLE Deficits / Details: post op pain and weakness     ADLs   Overall ADL's : Needs assistance/impaired Eating/Feeding: Independent, Sitting Grooming: Wash/dry hands, Wash/dry face, Oral care, Set up, Sitting, Bed level Upper Body Bathing: Set up, Sitting Lower Body Bathing: Maximal assistance, Cueing for safety, Cueing for sequencing, Sit to/from stand Upper Body Dressing : Set up, Sitting, Bed level Lower Body Dressing: Maximal assistance, Cueing for safety, Cueing for sequencing, Sit to/from stand General ADL Comments: Pt limited in transfers at this time due to pain and HR     Mobility   Overal bed mobility: Needs Assistance Bed Mobility: Supine to Sit, Sit to Supine Supine to sit: Supervision Sit to supine: Supervision     Transfers   Overall transfer level: Needs assistance Equipment used: Rolling walker (2 wheels) Transfers: Sit to/from Stand Sit to Stand: Min assist, +2 physical assistance General transfer comment: assist to power up into standing from very elevated surface. Cues for hand placement. Deferred further mobility due to elevated HR in 140s-150     Ambulation / Gait / Stairs / Proofreader / Balance Balance Overall balance assessment: Needs assistance Sitting-balance support: No upper extremity supported, Feet supported Sitting balance-[name]:  Good Standing balance support: Bilateral upper extremity supported, Reliant on assistive device for balance Standing balance-[name]: Poor     Special needs/care consideration      Previous Home Environment  Living Arrangements:  (Nephew lives with him, [name])  Lives With: Other (Comment) (nephew lives with him) Available Help at Discharge:  [name] works days) Type of Home: [place]: One level Home Access: Stairs to enter Entrance Stairs-Rails: None Entrance Stairs-Number of Steps: small step and 6 inch step Bathroom Shower/Tub: Tub/shower unit, Curtain, Sponge bathes at baseline [employer]: Handicapped height Bathroom Accessibility: Yes How Accessible: Accessible via walker Home Care Services: No Additional Comments: nephew works   Discharge Living Setting Plans for Discharge Living Setting: Patient's home (nephew lives with him) Type of Home at Discharge: House Discharge Home Layout: One level Discharge Home Access: Stairs to enter Entrance Stairs-Rails: None Entrance Stairs-Number of Steps: small step and 6 inch step Discharge Bathroom Shower/Tub: Tub/shower unit Discharge Bathroom Toilet: Handicapped height Discharge Bathroom Accessibility: Yes How Accessible: Accessible via walker Does the patient have any problems obtaining your medications?: No   Social/Family/Support Systems Contact Information: [name] first call then his nephew, [name] Anticipated Caregiver: friends prn Anticipated Caregiver's Contact Information: see contacts Ability/Limitations of Caregiver: Nephew works days Caregiver Availability: Intermittent Discharge Plan Discussed with  Primary Caregiver: Yes Is Caregiver In Agreement with Plan?: Yes Does Caregiver/Family have Issues with Lodging/Transportation while Pt is in Rehab?: No   Goals Patient/Family Goal for Rehab: Mod I with PT and OT Expected length of stay: ELOS 7 to 10 days Pt/Family Agrees to Admission and willing to  participate: Yes Program Orientation Provided & Reviewed with Pt/Caregiver Including Roles  & Responsibilities: Yes   Decrease burden of Care through IP rehab admission: n/a   Possible need for SNF placement upon discharge: not anticipated   Patient Condition: I have reviewed medical records from [facility], spoken with CM, and patient. I met with patient at the bedside for inpatient rehabilitation assessment.  Patient will benefit from ongoing PT and OT, can actively participate in 3 hours of therapy a day 5 days of the week, and can make measurable gains during the admission.  Patient will also benefit from the coordinated team approach during an Inpatient Acute Rehabilitation admission.  The patient will receive intensive therapy as well as Rehabilitation physician, nursing, social worker, and care management interventions.  Due to bladder management, bowel management, safety, skin/wound care, disease management, medication administration, pain management, and patient education the patient requires 24 hour a day rehabilitation nursing.  The patient is currently mod assist overall with mobility and basic ADLs.  Discharge setting and therapy post discharge at home with home health is anticipated.  Patient has agreed to participate in the Acute Inpatient Rehabilitation Program and will admit today.   Preadmission Screen Completed By:  Cleatrice Burke, 07/25/2022 11:53 AM ______________________________________________________________________   Discussed status with Dr. Tressa Busman on 07/25/22 at 1149 and received approval for admission today.   Admission Coordinator:  Cleatrice Burke, RN, time 7867 Date 07/25/22    Assessment/Plan: Diagnosis: Does the need for close, 24 hr/day Medical supervision in concert with the patient's rehab needs make it unreasonable for this patient to be served in a less intensive setting? Yes Co-Morbidities requiring supervision/potential complications:  CKD stage 2, hyponatremia, a fib with RVR, HTN, pain control, post-op wound care Due to safety, skin/wound care, disease management, medication administration, pain management, and patient education, does the patient require 24 hr/day rehab nursing? Yes Does the patient require coordinated care of a physician, rehab nurse, PT, OT to address physical and functional deficits in the context of the above medical diagnosis(es)? Yes Addressing deficits in the following areas: balance, endurance, locomotion, strength, transferring, bathing, dressing, and toileting Can the patient actively participate in an intensive therapy program of at least 3 hrs of therapy 5 days a week? Yes The potential for patient to make measurable gains while on inpatient rehab is excellent Anticipated functional outcomes upon discharge from inpatient rehab: supervision PT, independent OT Estimated rehab length of stay to reach the above functional goals is: 7-10 days Anticipated discharge destination: Home 10. Overall Rehab/Functional Prognosis: excellent     MD Signature:     Gertie Gowda, DO 07/25/2022          Revision History                                Note Details  Author Gertie Gowda, DO File Time 07/25/2022 12:19 PM  Author Type Physician Status Signed  Last Editor Gertie Gowda, [place] # [id number] Admit Date 07/21/2022

## 2022-07-25 NOTE — Progress Notes (Addendum)
Inpatient Rehabilitation Admissions Coordinator   I have insurance approval and CIR bed that I can admit him to today. I met with patient at bedside and he is in agreement. I have contacted Dr Eliseo Squires, Dr Sharol Given, acute team and TOC.  Dr Eliseo Squires has given approval for discharge to CIR today.  Danne Baxter, RN, MSN Rehab Admissions Coordinator [phone] 07/25/2022 10:49 AM

## 2022-07-25 NOTE — PMR Pre-admission (Signed)
PMR Admission Coordinator Pre-Admission Assessment  Patient: Robert Burnett is an 61 y.o., male MRN: [id number] DOB: [birth year] Height: _0  (185.4 cm) Weight: 81.2 kg  Insurance Information HMO: yes    PPO:      PCP:      IPA:      80/20:      OTHER:  PRIMARY: [employer]      Policy#: [id number]      Subscriber: pt CM Name: Theadora Rama      Phone#: [phone] ext [phone]     Fax#: [phone] Pre-Cert#: [id number] APPROVED FOR 7 DAYS   f/u with Risa Grill phone [phone] fax [phone]    Employer:  Benefits:  Phone #: [phone]     Name: 10/5 Eff. Date: 10/20/21     Deduct: $6800      Out of Pocket Max: $9100      Life Max: none CIR: 60%      SNF: 60% Outpatient: 60%     Co-Pay: visits per medical neccesity Home Health: 60%      Co-Pay: visits per medical neccesity DME: 60%     Co-Pay: 40% Providers: in network  SECONDARY: none  Financial Counselor:       Phone#:   The Engineer, [employer]" for patients in Inpatient Rehabilitation Facilities with attached "Privacy Act [place] Records" was provided and verbally reviewed with: N/A  Emergency Contact Information Contact Information     Name Relation Home Work Mobile   May,Alton Relative [phone]  [place] [phone]  [phone]   Roselyn Reef [phone]  [phone]   May,Cherice Relative [phone]        Current Medical History  Patient Admitting Diagnosis: BKA  History of Present Illness: 60 year old male with history of HTN, T2DM, fatty liver, HLD, urothelial cancer, Diabetic neuropathy who presented to ED on 07/21/22 with worsening left foot pain , foul drainage and swelling for 1 week. History of admit 6/23 for diabetic foot infection with wet gangrene and necrotizing infection and osteomyelitis. S/P metatarsal amputation of the left foot and I and D by podiatry. 03/25/22. Also admitted 06/04/22 for NSTEMI s/p LHC with stent to LAD on  8/17.On triple therapy with ASA, Plavix and Eliquis. Also with ischemic CM with EF of 40 to 45%.  Podiatry initially consulted who recommended ortho consult by Dr Sharol Given. 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 Postoperative pain management and Vanc/Zosyn for 24 hrs. Will need wound Vac for 1 week. Poorly controlled DM with SSI , accucheck and continue farxiga. CAD to resume plavix, statin and Toprol. Strict I and Os for ischemic CM.   Patient's medical record from [facility] has been reviewed by the rehabilitation admission coordinator and physician.  Past Medical History  Past Medical History:  Diagnosis Date   Allergy    Anemia    Arthritis    ARTHRITIS IN SPINE - MAKES PT HAVE CHEST PAIN- USES fENTANYL PATCH    Asthma    IN TEENS    Bacteremia    a. 03/2022 Group B Strep bacteremia in setting of diabetic L foot infxn/gangrene/osteo.   Diabetes mellitus    Diabetic peripheral neuropathy associated with type 2 diabetes mellitus ([place]) 12/12/2011   Fatty liver disease, nonalcoholic 30/04/6225   Foot osteomyelitis, left ([place])    a. 2012 s/p toe amputations on L; b. 03/2022 - admitted w/ wet gangrene and necrotizing infxn w/ osteomyelitis-->s/p transmetatarsal followed by Lisfranc amputation.   GERD (gastroesophageal reflux disease)  H/O degenerative disc disease    History of echocardiogram    a. 03/2022 Echo: EF 55-60%, no rwma, nl RV fxn, mildly elev PASP. Mild MR.   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([place])    Has the patient had major surgery during 100 days prior to admission? Yes  Family History   family history includes Cancer in his mother and sister; Diabetes in his father and sister; Hypertension in his father; Lung cancer in his father.  Current Medications  Current Facility-Administered Medications:    0.9 %  sodium chloride infusion, , Intravenous, Continuous, [name], MD, Last Rate: 10 mL/hr at 07/24/22 2153, New  Bag at 07/24/22 2153   acetaminophen (TYLENOL) tablet 650 mg, 650 mg, Oral, Q6H PRN, 650 mg at 07/25/22 0040 **OR** acetaminophen (TYLENOL) suppository 650 mg, 650 mg, Rectal, Q6H PRN, [name], MD   alum & mag hydroxide-simeth (MAALOX/MYLANTA) 200-200-20 MG/5ML suspension 15-30 mL, 15-30 mL, Oral, Q2H PRN, [name], MD   apixaban Arne Cleveland) tablet 5 mg, 5 mg, Oral, BID, Vann, Jessica U, DO, 5 mg at 07/25/22 1140   ascorbic acid (VITAMIN C) tablet 1,000 mg, 1,000 mg, Oral, Daily, [name], MD, 1,000 mg at 07/25/22 0905   atorvastatin (LIPITOR) tablet 80 mg, 80 mg, Oral, Daily, [name], MD, 80 mg at 07/25/22 0905   bisacodyl (DULCOLAX) EC tablet 5 mg, 5 mg, Oral, Daily PRN, [name], MD   Chlorhexidine Gluconate Cloth 2 % PADS 6 each, 6 each, Topical, Q0600, [name], MD, 6 each at 07/25/22 0908   clopidogrel (PLAVIX) tablet 75 mg, 75 mg, Oral, Daily, [name], MD, 75 mg at 07/25/22 0905   dapagliflozin propanediol (FARXIGA) tablet 10 mg, 10 mg, Oral, QAC breakfast, [name], MD, 10 mg at 07/25/22 0867   docusate sodium (COLACE) capsule 100 mg, 100 mg, Oral, Daily, [name], MD, 100 mg at 07/25/22 6195   ezetimibe (ZETIA) tablet 10 mg, 10 mg, Oral, Daily, [name], MD, 10 mg at 07/25/22 0905   fentaNYL (DURAGESIC) 75 MCG/HR 1 patch, 1 patch, Transdermal, Q72H, Vann, Jessica U, DO, 1 patch at 07/25/22 1140   guaiFENesin-dextromethorphan (ROBITUSSIN DM) 100-10 MG/5ML syrup 15 mL, 15 mL, Oral, Q4H PRN, [name], MD   hydrALAZINE (APRESOLINE) injection 5 mg, 5 mg, Intravenous, Q20 Min PRN, [name], MD   HYDROmorphone (DILAUDID) injection 1 mg, 1 mg, Intravenous, Q3H PRN, Vann, Jessica U, DO   insulin aspart (novoLOG) injection 0-9 Units, 0-9 Units, Subcutaneous, TID WC, [name], MD, 2 Units at 07/25/22 0811   insulin detemir (LEVEMIR) injection 8 Units, 8 Units, Subcutaneous, QHS, [name], MD, 8 Units at 07/24/22  2156   labetalol (NORMODYNE) injection 10 mg, 10 mg, Intravenous, Q10 min PRN, [name], MD   magnesium citrate solution 1 Bottle, 1 Bottle, Oral, Once PRN, [name], MD   magnesium sulfate IVPB 2 g 50 mL, 2 g, Intravenous, Daily PRN, [name], MD   methocarbamol (ROBAXIN) tablet 500 mg, 500 mg, Oral, Q6H PRN, Vann, Jessica U, DO, 500 mg at 07/25/22 0811   metoprolol succinate (TOPROL-XL) 24 hr tablet 25 mg, 25 mg, Oral, TID, [name], MD, 25 mg at 07/25/22 0908   mupirocin ointment (BACTROBAN) 2 % 1 Application, 1 Application, Nasal,  BID, [name], MD, 1 Application at 61/44/31 5400   nicotine (NICODERM CQ - dosed in mg/24 hr) patch 7 mg, 7 mg, Transdermal, Daily, [name], MD, 7 mg at 07/25/22 0815   nutrition supplement (JUVEN) (JUVEN) powder packet 1 packet, 1 packet, Oral, BID BM, [name], MD, 1 packet at 07/25/22 0905   ondansetron (ZOFRAN) injection 4 mg, 4 mg, Intravenous, Q6H PRN, [name], MD   oxyCODONE (Oxy IR/ROXICODONE) immediate release tablet 10-15 mg, 10-15 mg, Oral, Q4H PRN, [name], MD, 15 mg at 07/25/22 8676   oxyCODONE (Oxy IR/ROXICODONE) immediate release tablet 5 mg, 5 mg, Oral, Q4H PRN, [name], MD, 5 mg at 07/22/22 1543   oxyCODONE (Oxy IR/ROXICODONE) immediate release tablet 5-10 mg, 5-10 mg, Oral, Q4H PRN, [name], MD   pantoprazole (PROTONIX) EC tablet 40 mg, 40 mg, Oral, Daily, [name], MD, 40 mg at 07/25/22 0905   phenol (CHLORASEPTIC) mouth spray 1 spray, 1 spray, Mouth/Throat, PRN, [name], MD   polyethylene glycol (MIRALAX / GLYCOLAX) packet 17 g, 17 g, Oral, Daily PRN, [name], MD   potassium chloride SA (KLOR-CON M) CR tablet 20-40 mEq, 20-40 mEq, Oral, Daily PRN, [name], MD   zinc sulfate capsule 220 mg, 220 mg, Oral, Daily, [name], MD, 220 mg at 07/25/22 1950  Patients Current Diet:  Diet Order             Diet Carb Modified           Diet Carb  Modified Fluid consistency: Thin; Room service appropriate? Yes  Diet effective now                  Precautions / Restrictions Precautions Precautions: Fall Precaution Comments: wound vac; watch HR Restrictions Weight Bearing Restrictions: Yes LLE Weight Bearing: Non weight bearing   Has the patient had 2 or more falls or a fall with injury in the past year? No  Prior Activity Level Community (5-7x/wk): Mod I with crutches  Prior Functional Level Self Care: Did the patient need help bathing, dressing, using the toilet or eating? Independent  Indoor Mobility: Did the patient need assistance with walking from room to room (with or without device)? Independent  Stairs: Did the patient need assistance with internal or external stairs (with or without device)? Independent  Functional Cognition: Did the patient need help planning regular tasks such as shopping or remembering to take medications? Independent  Patient Information Are you of Hispanic, Latino/a,or Spanish origin?: A. No, not of Hispanic, Latino/a, or Spanish origin What is your race?: A. White Do you need or want an interpreter to communicate with a doctor or health care staff?: 0. No  Patient's Response To:  Health Literacy and Transportation Is the patient able to respond to health literacy and transportation needs?: Yes Health Literacy - How often do you need to have someone help you when you read instructions, pamphlets, or other written material from your doctor or pharmacy?: Never In the past 12 months, has lack of transportation kept you from medical appointments or from getting medications?: No In the past 12 months, has lack of transportation kept you from meetings, work, or from getting things needed for daily living?: No  Home Assistive Devices / Equipment Home Equipment: Conservation officer, nature (2 wheels), Wheelchair - manual, BSC/3in1, Crutches, Tub bench  Prior Device Use: Indicate devices/aids used by the  patient prior to current illness,  exacerbation or injury?  crutches  Current Functional Level Cognition  Overall Cognitive Status: Within Functional Limits for tasks assessed Orientation Level: Oriented X4    Extremity Assessment (includes Sensation/Coordination)  Upper Extremity Assessment: Overall WFL for tasks assessed  Lower Extremity Assessment: Defer to PT evaluation LLE Deficits / Details: post op pain and weakness    ADLs  Overall ADL's : Needs assistance/impaired Eating/Feeding: Independent, Sitting Grooming: Wash/dry hands, Wash/dry face, Oral care, Set up, Sitting, Bed level Upper Body Bathing: Set up, Sitting Lower Body Bathing: Maximal assistance, Cueing for safety, Cueing for sequencing, Sit to/from stand Upper Body Dressing : Set up, Sitting, Bed level Lower Body Dressing: Maximal assistance, Cueing for safety, Cueing for sequencing, Sit to/from stand General ADL Comments: Pt limited in transfers at this time due to pain and HR    Mobility  Overal bed mobility: Needs Assistance Bed Mobility: Supine to Sit, Sit to Supine Supine to sit: Supervision Sit to supine: Supervision    Transfers  Overall transfer level: Needs assistance Equipment used: Rolling walker (2 wheels) Transfers: Sit to/from Stand Sit to Stand: Min assist, +2 physical assistance General transfer comment: assist to power up into standing from very elevated surface. Cues for hand placement. Deferred further mobility due to elevated HR in 140s-150    Ambulation / Gait / Stairs / Office manager / Balance Balance Overall balance assessment: Needs assistance Sitting-balance support: No upper extremity supported, Feet supported Sitting balance-[name]: Good Standing balance support: Bilateral upper extremity supported, Reliant on assistive device for balance Standing balance-[name]: Poor    Special needs/care consideration    Previous Home Environment  Living  Arrangements:  (Nephew lives with him, [name])  Lives With: Other (Comment) (nephew lives with him) Available Help at Discharge:  [name] works days) Type of Home: [place]: One level Home Access: Stairs to enter Entrance Stairs-Rails: None Entrance Stairs-Number of Steps: small step and 6 inch step Bathroom Shower/Tub: Tub/shower unit, Curtain, Sponge bathes at baseline [employer]: Handicapped height Bathroom Accessibility: Yes How Accessible: Accessible via walker Home Care Services: No Additional Comments: nephew works  Discharge Living Setting Plans for Discharge Living Setting: Patient's home (nephew lives with him) Type of Home at Discharge: House Discharge Home Layout: One level Discharge Home Access: Stairs to enter Entrance Stairs-Rails: None Entrance Stairs-Number of Steps: small step and 6 inch step Discharge Bathroom Shower/Tub: Tub/shower unit Discharge Bathroom Toilet: Handicapped height Discharge Bathroom Accessibility: Yes How Accessible: Accessible via walker Does the patient have any problems obtaining your medications?: No  Social/Family/Support Systems Contact Information: [name] first call then his nephew, [name] Anticipated Caregiver: friends prn Anticipated Ambulance person Information: see contacts Ability/Limitations of Caregiver: Nephew works days Caregiver Availability: Intermittent Discharge Plan Discussed with Primary Caregiver: Yes Is Caregiver In Agreement with Plan?: Yes Does Caregiver/Family have Issues with Lodging/Transportation while Pt is in Rehab?: No  Goals Patient/Family Goal for Rehab: Mod I with PT and OT Expected length of stay: ELOS 7 to 10 days Pt/Family Agrees to Admission and willing to participate: Yes Program Orientation Provided & Reviewed with Pt/Caregiver Including Roles  & Responsibilities: Yes  Decrease burden of Care through IP rehab admission: n/a  Possible need for SNF placement upon discharge: not  anticipated  Patient Condition: I have reviewed medical records from [facility], spoken with CM, and patient. I met with patient at the bedside for inpatient rehabilitation assessment.  Patient will benefit from ongoing PT and  OT, can actively participate in 3 hours of therapy a day 5 days of the week, and can make measurable gains during the admission.  Patient will also benefit from the coordinated team approach during an Inpatient Acute Rehabilitation admission.  The patient will receive intensive therapy as well as Rehabilitation physician, nursing, social worker, and care management interventions.  Due to bladder management, bowel management, safety, skin/wound care, disease management, medication administration, pain management, and patient education the patient requires 24 hour a day rehabilitation nursing.  The patient is currently mod assist overall with mobility and basic ADLs.  Discharge setting and therapy post discharge at home with home health is anticipated.  Patient has agreed to participate in the Acute Inpatient Rehabilitation Program and will admit today.  Preadmission Screen Completed By:  Cleatrice Burke, 07/25/2022 11:53 AM ______________________________________________________________________   Discussed status with Dr. Tressa Busman on 07/25/22 at 1149 and received approval for admission today.  Admission Coordinator:  Cleatrice Burke, RN, time 6073 Date 07/25/22   Assessment/Plan: Diagnosis: Does the need for close, 24 hr/day Medical supervision in concert with the patient's rehab needs make it unreasonable for this patient to be served in a less intensive setting? Yes Co-Morbidities requiring supervision/potential complications: CKD stage 2, hyponatremia, a fib with RVR, HTN, pain control, post-op wound care Due to safety, skin/wound care, disease management, medication administration, pain management, and patient education, does the patient require 24 hr/day  rehab nursing? Yes Does the patient require coordinated care of a physician, rehab nurse, PT, OT to address physical and functional deficits in the context of the above medical diagnosis(es)? Yes Addressing deficits in the following areas: balance, endurance, locomotion, strength, transferring, bathing, dressing, and toileting Can the patient actively participate in an intensive therapy program of at least 3 hrs of therapy 5 days a week? Yes The potential for patient to make measurable gains while on inpatient rehab is excellent Anticipated functional outcomes upon discharge from inpatient rehab: supervision PT, independent OT Estimated rehab length of stay to reach the above functional goals is: 7-10 days Anticipated discharge destination: Home 10. Overall Rehab/Functional Prognosis: excellent   MD Signature:   Gertie Gowda, DO 07/25/2022

## 2022-07-25 NOTE — Progress Notes (Signed)
Orthopedic Tech Progress Note Patient Details:  Robert Burnett [birth year] [id number]  Patient ID: [name], male   DOB: 1961-08-09, 61 y.o.   MRN: [id number] Order placed with [facility] for rehab prafo.  Vernona Rieger 07/25/2022, 5:53 PM

## 2022-07-25 NOTE — Progress Notes (Signed)
Inpatient Rehabilitation Admission Medication Review by a Pharmacist  A complete drug regimen review was completed for this patient to identify any potential clinically significant medication issues.  High Risk Drug Classes Is patient taking? Indication by Medication  Antipsychotic Yes Compazine PO/IM - N/V  Anticoagulant Yes Apixaban - PAF  Antibiotic No   Opioid Yes Oxycodone.fentanyl patch - pain  Antiplatelet Yes Plavix - CAD  Hypoglycemics/insulin Yes Levemir/SSI/Farxiga - DM  Vasoactive Medication Yes Toprol - HTN  Chemotherapy No   Other Yes Atorvastatin/Zetia - HLD Robaxin - spasms Protonix - GERD Trazodone - sleep Nicotine patch - smoking     Type of Medication Issue Identified Description of Issue Recommendation(s)  Drug Interaction(s) (clinically significant)     Duplicate Therapy     Allergy     No Medication Administration End Date     Incorrect Dose     Additional Drug Therapy Needed     Significant med changes from prior encounter (inform family/care partners about these prior to discharge).    Other  Finasteride/losartan/furosemide/potassium  Will resume as needed     Clinically significant medication issues were identified that warrant physician communication and completion of prescribed/recommended actions by midnight of the next day:  No  Name of provider notified for urgent issues identified:   Provider Method of Notification:     Pharmacist comments:   Time spent performing this drug regimen review (minutes):  [place], PharmD, [place], AAHIVP, CPP Infectious Disease Pharmacist 07/25/2022 5:53 PM

## 2022-07-26 DIAGNOSIS — D62 Acute posthemorrhagic anemia: Secondary | ICD-10-CM

## 2022-07-26 DIAGNOSIS — M869 Osteomyelitis, unspecified: Secondary | ICD-10-CM

## 2022-07-26 DIAGNOSIS — E871 Hypo-osmolality and hyponatremia: Secondary | ICD-10-CM

## 2022-07-26 DIAGNOSIS — G546 Phantom limb syndrome with pain: Secondary | ICD-10-CM | POA: Diagnosis not present

## 2022-07-26 DIAGNOSIS — Z89512 Acquired absence of left leg below knee: Secondary | ICD-10-CM | POA: Diagnosis not present

## 2022-07-26 DIAGNOSIS — R770 Abnormality of albumin: Secondary | ICD-10-CM

## 2022-07-26 LAB — CBC WITH DIFFERENTIAL/PLATELET
Abs Immature Granulocytes: 0.03 10*3/uL (ref 0.00–0.07)
Basophils Absolute: 0 10*3/uL (ref 0.0–0.1)
Basophils Relative: 0 %
Eosinophils Absolute: 0.1 10*3/uL (ref 0.0–0.5)
Eosinophils Relative: 1 %
HCT: 25.7 % — ABNORMAL LOW (ref 39.0–52.0)
Hemoglobin: 8.5 g/dL — ABNORMAL LOW (ref 13.0–17.0)
Immature Granulocytes: 1 %
Lymphocytes Relative: 16 %
Lymphs Abs: 0.8 10*3/uL (ref 0.7–4.0)
MCH: 26.7 pg (ref 26.0–34.0)
MCHC: 33.1 g/dL (ref 30.0–36.0)
MCV: 80.8 fL (ref 80.0–100.0)
Monocytes Absolute: 0.4 10*3/uL (ref 0.1–1.0)
Monocytes Relative: 7 %
Neutro Abs: 3.7 10*3/uL (ref 1.7–7.7)
Neutrophils Relative %: 75 %
Platelets: 196 10*3/uL (ref 150–400)
RBC: 3.18 MIL/uL — ABNORMAL LOW (ref 4.22–5.81)
RDW: 15 % (ref 11.5–15.5)
WBC: 4.9 10*3/uL (ref 4.0–10.5)
nRBC: 0 % (ref 0.0–0.2)

## 2022-07-26 LAB — COMPREHENSIVE METABOLIC PANEL
ALT: 22 U/L (ref 0–44)
AST: 34 U/L (ref 15–41)
Albumin: 1.8 g/dL — ABNORMAL LOW (ref 3.5–5.0)
Alkaline Phosphatase: 124 U/L (ref 38–126)
Anion gap: 7 (ref 5–15)
BUN: 19 mg/dL (ref 8–23)
CO2: 23 mmol/L (ref 22–32)
Calcium: 8.2 mg/dL — ABNORMAL LOW (ref 8.9–10.3)
Chloride: 102 mmol/L (ref 98–111)
Creatinine, Ser: 1.05 mg/dL (ref 0.61–1.24)
GFR, Estimated: >60 mL/min (ref >60)
Glucose, Bld: 124 mg/dL — ABNORMAL HIGH (ref 70–99)
Potassium: 3.8 mmol/L (ref 3.5–5.1)
Sodium: 132 mmol/L — ABNORMAL LOW (ref 135–145)
Total Bilirubin: 0.3 mg/dL (ref 0.3–1.2)
Total Protein: 6.6 g/dL (ref 6.5–8.1)

## 2022-07-26 LAB — GLUCOSE, CAPILLARY
Glucose-Capillary: 134 mg/dL — ABNORMAL HIGH (ref 70–99)
Glucose-Capillary: 177 mg/dL — ABNORMAL HIGH (ref 70–99)
Glucose-Capillary: 205 mg/dL — ABNORMAL HIGH (ref 70–99)

## 2022-07-26 LAB — MAGNESIUM: Magnesium: 1.7 mg/dL (ref 1.7–2.4)

## 2022-07-26 LAB — PHOSPHORUS: Phosphorus: 3 mg/dL (ref 2.5–4.6)

## 2022-07-26 MED ORDER — GABAPENTIN 100 MG PO CAPS
100.0000 mg | ORAL_CAPSULE | Freq: Three times a day (TID) | ORAL | Status: DC
  Administered 2022-07-26 – 2022-07-28 (×6): 100 mg via ORAL
  Filled 2022-07-26 (×6): qty 1

## 2022-07-26 MED ORDER — LIDOCAINE 5 % EX PTCH
2.0000 | MEDICATED_PATCH | CUTANEOUS | Status: DC
  Administered 2022-07-26 – 2022-08-07 (×13): 2 via TRANSDERMAL
  Filled 2022-07-26 (×12): qty 2

## 2022-07-26 NOTE — Progress Notes (Addendum)
PROGRESS NOTE   Subjective/Complaints: Reports continued pain in his residual limb and chronic back pain, pain is improved with current medications. Reports he is also having phantom pain.  LBM 10/6  Review of Systems  Constitutional:  Negative for chills and fever.  Respiratory:  Negative for shortness of breath.   Cardiovascular:  Negative for chest pain.  Gastrointestinal:  Negative for abdominal pain, diarrhea, nausea and vomiting.  Musculoskeletal:  Positive for back pain and joint pain.     Objective:   No results found. Recent Labs    07/25/22 0217 07/26/22 0610  WBC 7.3 4.9  HGB 9.0* 8.5*  HCT 27.2* 25.7*  PLT 211 196   Recent Labs    07/25/22 0217 07/26/22 0610  NA 128* 132*  K 3.7 3.8  CL 97* 102  CO2 21* 23  GLUCOSE 111* 124*  BUN 21 19  CREATININE 1.09 1.05  CALCIUM 8.4* 8.2*    Intake/Output Summary (Last 24 hours) at 07/26/2022 1252 Last data filed at 07/26/2022 0929 Gross per 24 hour  Intake 220 ml  Output 1150 ml  Net -930 ml        Physical Exam: Vital Signs Blood pressure 117/80, pulse 62, temperature 98.2 F (36.8 C), temperature source Oral, resp. rate 18, height '6\' 1"'$  (1.854 m), weight 80.9 kg, SpO2 100 %.    General: Alert and oriented x 3, No apparent distress HEENT: Head is normocephalic, atraumatic, PERRLA, EOMI, sclera anicteric, oral mucosa pink and moist Neck: Supple without JVD or lymphadenopathy Heart: Reg rate and rhythm. No murmurs rubs or gallops Chest: CTA bilaterally without wheezes, rales, or rhonchi; no distress Abdomen: Soft, non-tender, non-distended, bowel sounds positive. Extremities: No clubbing, cyanosis, or edema. Pulses are 2+ Psych: Pt's affect is appropriate. Pt is cooperative Skin: warm and dry Musculoskeletal:     Cervical back: Neck supple.     Comments: Active pressure dressing in place left residual limb with good seal.      LLE - 3+/5 hip  flexion, limited by pain     RLE, BL UE - 5/5 throughout     + R foot resting plantarflexion tone Neurological:     Sensation: Decreased to light touch over right foot, ankle    Mental Status: He is alert and oriented to person, place, and time.    Assessment/Plan: 1. Functional deficits which require 3+ hours per day of interdisciplinary therapy in a comprehensive inpatient rehab setting. Physiatrist is providing close team supervision and 24 hour management of active medical problems listed below. Physiatrist and rehab team continue to assess barriers to discharge/monitor patient progress toward functional and medical goals  Care Tool:  Bathing    Body parts bathed by patient: Right arm, Left arm, Chest, Abdomen, Front perineal area, Buttocks, Right upper leg, Left upper leg, Right lower leg, Face     Body parts n/a: Left lower leg   Bathing assist Assist Level: Contact Guard/Touching assist     Upper Body Dressing/Undressing Upper body dressing   What is the patient wearing?: Pull over shirt    Upper body assist Assist Level: Supervision/Verbal cueing    Lower Body Dressing/Undressing Lower body dressing  What is the patient wearing?: Underwear/pull up     Lower body assist Assist for lower body dressing: Minimal Assistance - Patient > 75% (Manage wound vac)     Toileting Toileting    Toileting assist Assist for toileting: Contact Guard/Touching assist     Transfers Chair/bed transfer  Transfers assist     Chair/bed transfer assist level: Contact Guard/Touching assist     Locomotion Ambulation   Ambulation assist              Walk 10 feet activity   Assist           Walk 50 feet activity   Assist           Walk 150 feet activity   Assist           Walk 10 feet on uneven surface  activity   Assist           Wheelchair     Assist               Wheelchair 50 feet with 2 turns  activity    Assist            Wheelchair 150 feet activity     Assist          Blood pressure 117/80, pulse 62, temperature 98.2 F (36.8 C), temperature source Oral, resp. rate 18, height '6\' 1"'$  (1.854 m), weight 80.9 kg, SpO2 100 %.  Medical Problem List and Plan: 1. Functional deficits secondary to left below the knee amputation, osteoarthritis with chronic pain.             -patient may not shower             -ELOS/Goals: 7-10 days  -Continue CIR  -PRAFO has been ordered on right 2.  Antithrombotics: -DVT/anticoagulation:  Pharmaceutical: Eliquis             -antiplatelet therapy: Plavix 3. Pain Management (acute post-op, chronic chest/back): Fentanyl patch.  Oxycodone, Robaxin, Tylenol as needed             - Fentanyl patch 75 mcg/hr OP medication, per PDMP last filled 06/06/22 by Dr. Florencia Re[facility] (15 days supply)                       - Restarted inpatient 10/6                       - Of note, documented insurance denial for further patches 8/21 d/t no pain contract  -07/26/22 start gabapentin '100mg'$  TID for phantom pain   4. Mood/Behavior/Sleep: LCSW to evaluate and provide emotional support             -antipsychotic agents: None   5. Neuropsych/cognition: This patient is capable of making decisions on his own behalf. 6. Skin/Wound Care: Routine skin care checks             -VAC dressing can come down 10/11   7. Fluids/Electrolytes/Nutrition: Routine I's and O's             -continue carb modified diet             -continue vitamin C, zinc, Juven supplements 8: CAD/ Paroxysmal A fib: left heart cath 05/2022; stents to LAD             -Restarted on Eliquis 10/5             -  continue Toprol XL 25 mg TID             - Intermittent tachycardia likely d/t poor pain control    9: T2DM c/b peripheral neuropathy: CBGs q AC and q HS             -continue Levemir 8 units daily             -continue Farxiga 10 mg daily             -SSI CBG (last 3)   Recent Labs    07/25/22 1713 07/25/22 2118 07/26/22 1138  GLUCAP 142* 194* 134*    10: Hyperlipidemia: continue Zetia, Lipitor 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 Is/Os negative; follow-up BMP  -10/7 Improved to 132 , continue to monitor 15. Right foot plantarflexion tone: Order PRAFO on IPR admission.  16. Low albumin  -Continue protein supplement   Addendum- patient reported some "arthritis soreness" along his chest wall and arms.  He feels like he is sore from his therapy.  No SOB, no abdominal pain.  Denies any shooting pain down his arms.  Pain is reproduced by palpation on right chest wall. Will start lidocaine patch for chest wall discomfort.  LOS: 1 days A FACE TO FACE EVALUATION WAS PERFORMED  Jennye Boroughs 07/26/2022, 12:52 PM

## 2022-07-26 NOTE — Progress Notes (Signed)
Patient c/o chest pain midsternally RN notified and in room to assess patient. patient denies pain in jaw or left arm patient history of arthritis in spine and sternum.patient describes pain as a tightness and states " I have been moving around a lot more today in that area of my chest." Vitals signs taken per flowsheet at 1630 Md notified and in room to assess patient new orders for lidocaine patch for chest wall pain.plan of care in progress

## 2022-07-26 NOTE — Progress Notes (Signed)
Occupational Therapy Session Note  Patient Details  Name: Robert Burnett MRN: [id number] Date of Birth: 10/27/1960  Today's Date: 07/26/2022 OT Individual Time: 1345-1445 OT Individual Time Calculation (min): 60 min    Short Term Goals: Week 1:  OT Short Term Goal 1 (Week 1): Pt will be CGA for LB dressing OT Short Term Goal 2 (Week 1): Pt will be SBA for functional transfers OT Short Term Goal 3 (Week 1): Pt will demonstrate imporved pain managmeent with techniques utilized OT Short Term Goal 4 (Week 1): Pt will completes UB ADLs with Mod I  Skilled Therapeutic Interventions/Progress Updates:    Upon OT arrival, pt supine in bed reporting pain at 4/10 to L LE. Pt agreeable to OT treatment session. Treatment intervention with a focus on self care retraining, UE strengthening, endurance, standing tolerance, and dynamic standing balance. Pt completes supine to sit transfer with Supervision and dons L LE brace with SBA seated EOB. Pt completes squat pivot transfer from bed to w/c with SBA. Pt propels himself to main therapy gym using B UE and SBA. While seated in w/c, pt completes B UE exercises using 3lb dumbbells for 2x10 reps. Pt completes shoulder flex/ext, elbow flex/ext, shoulder abd/add, supin/pronat, scapular prot/retr, wrist flex/ext. Pt requires verbal cues for proper form and rest breaks required. Pt was transported to tabletop with total Af for time. Pt completes 3 sit to stand transfers with CGA and stands with Min A-CGA to sort playing cards based on suit and in numerical order. Pt requires seated rest breaks secondary to decreased endurance and standing tolerance. Pt sorts cards without errors. Pt was transported back to his room via w/c and total A completes squat pivot transfer with SBA. Pt doffs brace with SBA and completes sit to supine transfer with SBA. Pt was left in bed at end of session with all needs met and safety measures in place.   Therapy Documentation Precautions:   Precautions Precautions: Fall Precaution Comments: wound vac; watch HR Restrictions Weight Bearing Restrictions: Yes LLE Weight Bearing: Non weight bearing   Therapy/Group: Individual Therapy  Marvetta Gibbons 07/26/2022, 4:47 PM

## 2022-07-26 NOTE — Evaluation (Signed)
Occupational Therapy Assessment and Plan  Patient Details  Name: Robert Burnett MRN: [id number] Date of Birth: 09/29/1961  OT Diagnosis: acute pain, ataxia, muscle weakness (generalized), and pain in joint Rehab Potential: Rehab Potential (ACUTE ONLY): Good ELOS: 2 weeks   Today's Date: 07/26/2022 OT Individual Time: 8469-6295 OT Individual Time Calculation (min): 75 min     Hospital Problem: Principal Problem:   Osteomyelitis of left lower extremity ([place])   Past Medical History:  Past Medical History:  Diagnosis Date   Allergy    Anemia    Arthritis    ARTHRITIS IN SPINE - MAKES PT HAVE CHEST PAIN- USES fENTANYL PATCH    Asthma    IN TEENS    Bacteremia    a. 03/2022 Group B Strep bacteremia in setting of diabetic L foot infxn/gangrene/osteo.   Diabetes mellitus    Diabetic peripheral neuropathy associated with type 2 diabetes mellitus ([place]) 12/12/2011   Fatty liver disease, nonalcoholic 28/41/3244   Foot osteomyelitis, left ([place])    a. 2012 s/p toe amputations on L; b. 03/2022 - admitted w/ wet gangrene and necrotizing infxn w/ osteomyelitis-->s/p transmetatarsal followed by Lisfranc amputation.   GERD (gastroesophageal reflux disease)    H/O degenerative disc disease    History of echocardiogram    a. 03/2022 Echo: EF 55-60%, no rwma, nl RV fxn, mildly elev PASP. Mild MR.   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[facility])    Past Surgical History:  Past Surgical History:  Procedure Laterality Date   ACHILLES TENDON SURGERY Left 03/28/2022   Procedure: ACHILLES LENGTHENING/KIDNER;  Surgeon: [name], DPM;  Location: [facility];  Service: Podiatry;  Laterality: Left;   AMPUTATION Left 03/25/2022   Procedure: AMPUTATION DIGITS,TRANSMETARSAL AMPUTATION, REMOVAL OF TOES AND BONE BEHIND TOES ;  Surgeon: [name], DPM;  Location: [facility];  Service: Podiatry;  Laterality: Left;   AMPUTATION Left 07/23/2022    Procedure: LEFT BELOW KNEE AMPUTATION;  Surgeon: [name], MD;  Location: [place];  Service: Orthopedics;  Laterality: Left;   APPLICATION OF WOUND VAC Left 03/25/2022   Procedure: APPLICATION OF WOUND VAC;  Surgeon: [name], DPM;  [facility]: [facility];  Service: Podiatry;  Laterality: Left;   CORONARY STENT INTERVENTION N/A 06/05/2022   Procedure: CORONARY STENT INTERVENTION;  Surgeon: Belva Crome, MD;  Location: [place] [facility];  Service: Cardiovascular;  Laterality: N/A;   CYSTOSCOPY/RETROGRADE/URETEROSCOPY Left 10/29/2020   Procedure: CYSTOSCOPY/RETROGRADE/ LEFT URETEROSCOPY WITH BIOPSY, LEFT URETERAL STENT;  Surgeon: Raynelle Bring, MD;  [facility];  Service: Urology;  Laterality: Left;   IRRIGATION AND DEBRIDEMENT FOOT Left 03/28/2022   Procedure: AMPUTATION LISFRANC, SECONDARY WOUND CLOSURE;  Surgeon: [name], DPM;  [facility];  Service: Podiatry;  Laterality: Left;   KNEE SURGERY Bilateral 1975 and 1989   LEFT HEART CATH AND CORONARY ANGIOGRAPHY N/A 06/05/2022   Procedure: LEFT HEART CATH AND CORONARY ANGIOGRAPHY;  Surgeon: Belva Crome, MD;  Location: [place] [facility];  Service: Cardiovascular;  Laterality: N/A;   MULTIPLE TOOTH EXTRACTIONS     ROBOT ASSITED LAPAROSCOPIC NEPHROURETERECTOMY Left 12/13/2020   Procedure: XI ROBOT ASSITED LAPAROSCOPIC NEPHROURETERECTOMY , POST OPERATIVE INTRAVESICAL GEMCITABINE;  Surgeon: Raynelle Bring, MD;  [facility];  Service: Urology;  Laterality: Left;  ONLY NEEDS 240 MIN   TOEM AMPUTATION  Left    2012, second  toe   TRANSURETHRAL RESECTION OF BLADDER TUMOR N/A 08/29/2021   Procedure: TRANSURETHRAL RESECTION OF BLADDER TUMOR (TURBT) WITH CYSTOSCOPY;  Surgeon: Raynelle Bring, MD;  [facility];  Service: Urology;  Laterality: N/A;  GENERAL ANESTHESIA WITH PARALYSIS   WISDOM TOOTH EXTRACTION      Assessment & Plan Clinical Impression: Robert Burnett is a 61 year old male With a history of type 2 diabetes  mellitus and osteomyelitis of the left foot.  He presented to [facility] emergency department on 07/22/2022 with complaints of worsening pain and swelling of his left foot.  He endorsed purulent drainage.  Previously undergone toe amputation and amputation Lisfranc and tarsal left foot, delayed secondary closure of surgical wound and Achilles tendon lengthening by Dr. Sherryle Lis on 03/28/2022.  His medical history is significant for coronary artery disease and underwent left heart catheterization was placed to the LAD by Dr. Daneen Schick on 817/2023.  He is maintained on Eliquis.  Podiatry was consulted and MRI of the left foot obtained.  He underwent left BKA by Dr. Sharol Given on 10/4.  He required 2 units packed red blood cells secondary to acute blood loss anemia atop anemia of chronic disease.  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  He underwent TURP as well.   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  He was under the care of Dr. [name] for pain management.  The patient states her practice is closed and he has a primary care appointment at [facility] on 10/16.  He had fentanyl patch 75 mcg/h #5 last prescribed on 8/18 by Dr.Fang Xu.   His primary complaint today is left residual limb pain, left thigh and hip pain and sternal and rib pain he attributes to the wet weather outside. The patient requires inpatient physical medicine and rehabilitation evaluations and treatment secondary to dysfunction due to left below-knee amputation and chronic pain.   Review of Systems  Constitutional:  Negative for chills and fever.  HENT:  Negative for congestion and sore throat.   Eyes:  Negative for blurred vision and double vision.  Respiratory:  Negative for cough and shortness of breath.   Cardiovascular:  Negative for chest pain and palpitations.  Gastrointestinal:  Negative for constipation, nausea and vomiting.        Complains of sternal pain if his stomach gets full  Genitourinary:  Negative for dysuria and urgency.  Musculoskeletal:  Positive for back pain and joint pain.  Neurological:  Negative for dizziness and headaches.  Psychiatric/Behavioral:  Negative for depression. The patient does not have insomnia.     Patient transferred to CIR on 07/25/2022 .    Patient currently requires min with basic self-care skills secondary to muscle weakness, decreased cardiorespiratoy endurance, decreased coordination, and decreased sitting balance, decreased standing balance, decreased postural control, and decreased balance strategies.  Prior to hospitalization, patient could complete ADLs and functional mobility independently.  Patient will benefit from skilled intervention to increase independence with basic self-care skills prior to discharge home with care partner.  Anticipate patient will require intermittent supervision and follow up home health.  OT - End of Session Activity Tolerance: Tolerates 30+ min activity with multiple rests Endurance Deficit: Yes OT Assessment Rehab Potential (ACUTE ONLY): Good OT Barriers to Discharge: Weight bearing restrictions;Lack of/limited family support;Decreased caregiver support OT Patient demonstrates impairments in the following area(s): Balance;Endurance;Motor;Pain OT Basic ADL's Functional Problem(s): Grooming;Bathing;Dressing;Toileting OT Advanced ADL's  Functional Problem(s): Simple Meal Preparation OT Transfers Functional Problem(s): Tub/Shower;Toilet OT Additional Impairment(s): Fuctional Use of Upper Extremity OT Plan OT Intensity: Minimum of 1-2 x/day, 45 to 90 minutes OT Frequency: 5 out of 7 days OT Duration/Estimated Length of Stay: 2 weeks OT Treatment/Interventions: Balance/vestibular training;Neuromuscular re-education;Self Care/advanced ADL retraining;Therapeutic Exercise;UE/LE Strength taining/ROM;Pain management;DME/adaptive equipment  instruction;Patient/family education;UE/LE Coordination activities;Community reintegration;Discharge planning;Functional mobility training;Psychosocial support;Therapeutic Activities OT Basic Self-Care Anticipated Outcome(s): Mod I OT Toileting Anticipated Outcome(s): Mod I OT Bathroom Transfers Anticipated Outcome(s): Mod I OT Recommendation Patient destination: Home Follow Up Recommendations: Home health OT Equipment Recommended: To be determined Equipment Details: has tub transfer bench   OT Evaluation Precautions/Restrictions  Precautions Precautions: Fall Precaution Comments: wound vac; watch HR Restrictions Weight Bearing Restrictions: Yes LLE Weight Bearing: Non weight bearing General Chart Reviewed: Yes Vital Signs Therapy Vitals Pulse Rate: 62 (apical)   Home Living/Prior Functioning Home Living Available Help at Discharge:  (nephew works days) Type of Home: House Home Access: Stairs to enter [employer] of Steps: small step and 6 inch step Entrance Stairs-Rails: None Home Layout: One level Bathroom Shower/Tub: Tub/shower unit, Architectural technologist: Handicapped height Bathroom Accessibility: Yes Additional Comments: wash sponge bathing after recent toe amputation but prior to that was showering  Lives With: Family (nephew [name] lives in back of house (apartment)) IADL History Homemaking Responsibilities: Yes Meal Prep Responsibility: Building surveyor Responsibility: Secondary Cleaning Responsibility: Secondary Bill Paying/Finance Responsibility: Primary Shopping Responsibility: Primary Current License: Yes Mode of Transportation: Other (comment) ([place]) Occupation: On disability Type of Occupation: Administrator, [employer] Service Leisure and Hobbies: enjoyed fishing and hunting prior to the pain starting 2 decades ago Prior Function Level of Independence: Independent with basic ADLs, Independent with homemaking with ambulation, Independent with transfers,  Independent with gait  Able to Take Stairs?: Yes Vocation: On disability Vision Baseline Vision/History: 1 Wears glasses (reading) Ability to See in Adequate Light: 0 Adequate Patient Visual Report: No change from baseline Vision Assessment?: Yes Eye Alignment: Within Functional Limits Ocular Range of Motion: Within Functional Limits Alignment/Gaze Preference: Within Defined Limits Tracking/Visual Pursuits: Able to track stimulus in all quads without difficulty Saccades: Within functional limits Convergence: Within functional limits Perception  Perception: Within Functional Limits Praxis Praxis: Intact Cognition Cognition Overall Cognitive Status: Within Functional Limits for tasks assessed Arousal/Alertness: Awake/alert Orientation Level: Person;Place;Situation Person: Oriented Place: Oriented Situation: Oriented Memory: Appears intact Attention: Focused Focused Attention: Appears intact Awareness: Appears intact Problem Solving: Appears intact Brief Interview for Mental Status (BIMS) Repetition of Three Words (First Attempt): 3 Temporal Orientation: Year: Correct Temporal Orientation: Month: Accurate within 5 days Temporal Orientation: Day: Correct Recall: "Sock": Yes, no cue required Recall: "Blue": Yes, no cue required Recall: "Bed": Yes, no cue required BIMS Summary Score: 15 Sensation Sensation Light Touch: Appears Intact Hot/Cold: Appears Intact Proprioception: Appears Intact Additional Comments: Numbness in R foot Coordination Gross Motor Movements are Fluid and Coordinated: No Fine Motor Movements are Fluid and Coordinated: No Coordination and Movement Description: decreased, difficutly holding items Motor  Motor Motor: Ataxia Motor - Skilled Clinical Observations: delayed  Trunk/Postural Assessment  Cervical Assessment Cervical Assessment: Exceptions to [facility] (forward head) Thoracic Assessment Thoracic Assessment: Exceptions to [facility] (rounded  shoulders) Lumbar Assessment Lumbar Assessment: Exceptions to [facility] (posterior pelvic tilt) Postural Control Postural Control: Deficits on evaluation (delayed)  Balance Balance Balance Assessed: Yes Static Sitting Balance Static Sitting - Balance Support: Feet supported Static Sitting - Level of Assistance: 6: Modified independent (Device/Increase time) Dynamic Sitting Balance Dynamic Sitting - Balance  Support: Feet supported Dynamic Sitting - Level of Assistance: 5: Stand by assistance Dynamic Sitting - Balance Activities: Reaching for objects Static Standing Balance Static Standing - Balance Support: During functional activity;Bilateral upper extremity supported Static Standing - Level of Assistance: 4: Min assist (CGA) Dynamic Standing Balance Dynamic Standing - Balance Support: Bilateral upper extremity supported Dynamic Standing - Level of Assistance: 4: Min assist (CGA) Dynamic Standing - Balance Activities: Reaching for objects Extremity/Trunk Assessment RUE Assessment RUE Assessment: Exceptions to [facility] General Strength Comments: 3+/5 MMT LUE Assessment LUE Assessment: Exceptions to [facility] General Strength Comments: 3/5 MMT  Care Tool Care Tool Self Care Eating   Eating Assist Level: Independent    Oral Care    Oral Care Assist Level: Set up assist    Bathing   Body parts bathed by patient: Right arm;Left arm;Chest;Abdomen;Front perineal area;Buttocks;Right upper leg;Left upper leg;Right lower leg;Face   Body parts n/a: Left lower leg Assist Level: Contact Guard/Touching assist    Upper Body Dressing(including orthotics)   What is the patient wearing?: Pull over shirt   Assist Level: Supervision/Verbal cueing    Lower Body Dressing (excluding footwear)   What is the patient wearing?: Underwear/pull up Assist for lower body dressing: Minimal Assistance - Patient > 75% (Manage wound vac)    Putting on/Taking off footwear   What is the patient wearing?:  [place] for footwear: Supervision/Verbal cueing       Care Tool Toileting Toileting activity   Assist for toileting: Contact Guard/Touching assist     Care Tool Bed Mobility Roll left and right activity   Roll left and right assist level: Supervision/Verbal cueing    Sit to lying activity   Sit to lying assist level: Contact Guard/Touching assist    Lying to sitting on side of bed activity   Lying to sitting on side of bed assist level: the ability to move from lying on the back to sitting on the side of the bed with no back support.: Contact Guard/Touching assist     Care Tool Transfers Sit to stand transfer   Sit to stand assist level: Contact Guard/Touching assist    Chair/bed transfer   Chair/bed transfer assist level: Contact Guard/Touching assist     Toilet transfer   Assist Level: Contact Guard/Touching assist     Care Tool Cognition  Expression of Ideas and Wants Expression of Ideas and Wants: 4. Without difficulty (complex and basic) - expresses complex messages without difficulty and with speech that is clear and easy to understand  Understanding Verbal and Non-Verbal Content Understanding Verbal and Non-Verbal Content: 4. Understands (complex and basic) - clear comprehension without cues or repetitions   Memory/Recall Ability Memory/Recall Ability : Current season;Location of own room;Staff names and faces;That he or she is in a hospital/hospital unit   Refer to Care Plan for [place] 1 OT Short Term Goal 1 (Week 1): Pt will be CGA for LB dressing OT Short Term Goal 2 (Week 1): Pt will be SBA for functional transfers OT Short Term Goal 3 (Week 1): Pt will demonstrate imporved pain managmeent with techniques utilized OT Short Term Goal 4 (Week 1): Pt will completes UB ADLs with Mod I  Recommendations for other services: None    Skilled Therapeutic Intervention ADL ADL Eating: Independent Where Assessed-Eating: Bed  level Grooming: Setup Where Assessed-Grooming: Edge of bed Upper Body Bathing: Supervision/safety Where Assessed-Upper Body Bathing: Edge of bed Lower Body Bathing: Contact guard Where Assessed-Lower  Body Bathing: Edge of bed Upper Body Dressing: Setup Where Assessed-Upper Body Dressing: Edge of bed Lower Body Dressing: Minimal assistance (manage wound vac) Where Assessed-Lower Body Dressing: Edge of bed Toileting: Contact guard Where Assessed-Toileting: Glass blower/designer: Therapist, music Method: Counselling psychologist: Bedside commode (over toilet) Tub/Shower Transfer: Unable to assess Social research officer, government: Curator Method: Heritage manager: Manufacturing systems engineer  Bed Mobility Bed Mobility: Rolling Left;Rolling Right;Supine to Sit;Sit to Supine Rolling Right: Supervision/verbal cueing Supine to Sit: Contact Guard/Touching assist Sit to Supine: Supervision/Verbal cueing Transfers Sit to Stand: Contact Guard/Touching assist Stand to Sit: Contact Guard/Touching assist  Upon OT arrival, pt semi recumbent in bed with RN present reporting pain at 5/10 in L side of body. Pt with L BKA and severe arthritis and is agreeable to OT session. OT evaluation initiated. Educated on role of OT and dicussed plan of care. Pt completes sponge bath ADL at the levels listed above. Pt presents with decreased balance, activity tolerance, increased pain, and is functioning below his baseline. Pt would benefit from OT services to achieve highest level of independence. Pt was left in bed at end of session with all needs met and safety measures in place.   Discharge Criteria: Patient will be discharged from OT if patient refuses treatment 3 consecutive times without medical reason, if treatment goals not met, if there is a change in medical status, if patient makes no progress towards goals or if patient is discharged from  hospital.  The above assessment, treatment plan, treatment alternatives and goals were discussed and mutually agreed upon: by patient  Marvetta Gibbons 07/26/2022, 10:52 AM

## 2022-07-27 DIAGNOSIS — Z794 Long term (current) use of insulin: Secondary | ICD-10-CM

## 2022-07-27 DIAGNOSIS — D62 Acute posthemorrhagic anemia: Secondary | ICD-10-CM | POA: Diagnosis not present

## 2022-07-27 DIAGNOSIS — E1159 Type 2 diabetes mellitus with other circulatory complications: Secondary | ICD-10-CM

## 2022-07-27 DIAGNOSIS — E871 Hypo-osmolality and hyponatremia: Secondary | ICD-10-CM | POA: Diagnosis not present

## 2022-07-27 DIAGNOSIS — M869 Osteomyelitis, unspecified: Secondary | ICD-10-CM | POA: Diagnosis not present

## 2022-07-27 DIAGNOSIS — Z89512 Acquired absence of left leg below knee: Secondary | ICD-10-CM | POA: Diagnosis not present

## 2022-07-27 LAB — CULTURE, BLOOD (ROUTINE X 2)
Culture: NO GROWTH
Culture: NO GROWTH

## 2022-07-27 LAB — GLUCOSE, CAPILLARY
Glucose-Capillary: 119 mg/dL — ABNORMAL HIGH (ref 70–99)
Glucose-Capillary: 124 mg/dL — ABNORMAL HIGH (ref 70–99)
Glucose-Capillary: 131 mg/dL — ABNORMAL HIGH (ref 70–99)
Glucose-Capillary: 183 mg/dL — ABNORMAL HIGH (ref 70–99)

## 2022-07-27 NOTE — Progress Notes (Addendum)
PROGRESS NOTE   Subjective/Complaints: Pt reports he continues to have pain of his anterior rib case. He has had this pain for over 10 years and was maintained on fentanyl 75 mg patch. He reports this pain was a little aggravated by therapy yesterday but is improved today and at his baseline. No new concerns to complaints today.   LBM 10/6  Review of Systems  Constitutional:  Negative for chills and fever.  Respiratory:  Negative for shortness of breath.   Cardiovascular:  Negative for chest pain.       Chest wall soreness  Gastrointestinal:  Negative for abdominal pain, diarrhea, nausea and vomiting.  Musculoskeletal:  Positive for back pain and joint pain.     Objective:   No results found. Recent Labs    07/25/22 0217 07/26/22 0610  WBC 7.3 4.9  HGB 9.0* 8.5*  HCT 27.2* 25.7*  PLT 211 196    Recent Labs    07/25/22 0217 07/26/22 0610  NA 128* 132*  K 3.7 3.8  CL 97* 102  CO2 21* 23  GLUCOSE 111* 124*  BUN 21 19  CREATININE 1.09 1.05  CALCIUM 8.4* 8.2*     Intake/Output Summary (Last 24 hours) at 07/27/2022 1118 Last data filed at 07/27/2022 0926 Gross per 24 hour  Intake 600 ml  Output 2050 ml  Net -1450 ml         Physical Exam: Vital Signs Blood pressure (!) 146/71, pulse 60, temperature 98 F (36.7 C), temperature source Oral, resp. rate 20, height '6\' 1"'$  (1.854 m), weight 80.9 kg, SpO2 100 %.    General: Alert and oriented x 3, No apparent distress HEENT: Head is normocephalic, atraumatic, PERRLA, EOMI, sclera anicteric, oral mucosa pink and moist Neck: Supple without JVD or lymphadenopathy Heart: Reg rate and rhythm. No murmurs rubs or gallops Chest: CTA bilaterally without wheezes, rales, or rhonchi; no distress Chest wall and sternal tenderness Abdomen: Soft, non-tender, non-distended, bowel sounds positive. Extremities: No clubbing, cyanosis, or edema. Pulses are 2+ Psych: Pt's  affect is appropriate. Pt is cooperative Skin: warm and dry Musculoskeletal:     Cervical back: Neck supple.     Comments: Active pressure dressing in place left residual limb with good seal.      LLE - 3+/5 hip flexion, limited by pain     RLE, BL UE - 5/5 throughout     + R foot resting plantarflexion tone Neurological:     Sensation: Decreased to light touch over right foot, ankle    Mental Status: He is alert and oriented to person, place, and time.    Assessment/Plan: 1. Functional deficits which require 3+ hours per day of interdisciplinary therapy in a comprehensive inpatient rehab setting. Physiatrist is providing close team supervision and 24 hour management of active medical problems listed below. Physiatrist and rehab team continue to assess barriers to discharge/monitor patient progress toward functional and medical goals  Care Tool:  Bathing    Body parts bathed by patient: Right arm, Left arm, Chest, Abdomen, Front perineal area, Buttocks, Right upper leg, Left upper leg, Right lower leg, Face     Body parts n/a: Left lower leg  Bathing assist Assist Level: Contact Guard/Touching assist     Upper Body Dressing/Undressing Upper body dressing   What is the patient wearing?: Pull over shirt    Upper body assist Assist Level: Supervision/Verbal cueing    Lower Body Dressing/Undressing Lower body dressing      What is the patient wearing?: Underwear/pull up     Lower body assist Assist for lower body dressing: Minimal Assistance - Patient > 75% (Manage wound vac)     Toileting Toileting    Toileting assist Assist for toileting: Contact Guard/Touching assist     Transfers Chair/bed transfer  Transfers assist     Chair/bed transfer assist level: Contact Guard/Touching assist     Locomotion Ambulation   Ambulation assist      Assist level: Contact Guard/Touching assist Assistive device: Walker-rolling Max distance: 10 ft   Walk 10 feet  activity   Assist     Assist level: Contact Guard/Touching assist Assistive device: Walker-rolling   Walk 50 feet activity   Assist Walk 50 feet with 2 turns activity did not occur: Safety/medical concerns         Walk 150 feet activity   Assist Walk 150 feet activity did not occur: Safety/medical concerns         Walk 10 feet on uneven surface  activity   Assist Walk 10 feet on uneven surfaces activity did not occur: Safety/medical concerns         Wheelchair     Assist Is the patient using a wheelchair?: Yes Type of Wheelchair: Manual    Wheelchair assist level: Maximal Assistance - Patient 25 - 49% Max wheelchair distance: 10 ft    Wheelchair 50 feet with 2 turns activity    Assist    Wheelchair 50 feet with 2 turns activity did not occur: Safety/medical concerns       Wheelchair 150 feet activity     Assist  Wheelchair 150 feet activity did not occur: Safety/medical concerns       Blood pressure (!) 146/71, pulse 60, temperature 98 F (36.7 C), temperature source Oral, resp. rate 20, height '6\' 1"'$  (1.854 m), weight 80.9 kg, SpO2 100 %.  Medical Problem List and Plan: 1. Functional deficits secondary to left below the knee amputation, osteoarthritis with chronic pain.             -patient may not shower             -ELOS/Goals: 7-10 days  -Continue CIR  -PRAFO has been ordered on right 2.  Antithrombotics: -DVT/anticoagulation:  Pharmaceutical: Eliquis             -antiplatelet therapy: Plavix 3. Pain Management (acute post-op, chronic chest/back): Fentanyl patch.  Oxycodone, Robaxin, Tylenol as needed             - Fentanyl patch 75 mcg/hr OP medication, per PDMP last filled 06/06/22 by Dr. Florencia Re[facility] (15 days supply)                       - Restarted inpatient 10/6                       - Of note, documented insurance denial for further patches 8/21 d/t no pain contract  -07/26/22 start gabapentin '100mg'$  TID for  phantom pain   4. Mood/Behavior/Sleep: LCSW to evaluate and provide emotional support             -  antipsychotic agents: None   5. Neuropsych/cognition: This patient is capable of making decisions on his own behalf. 6. Skin/Wound Care: Routine skin care checks             -VAC dressing can come down 10/11   7. Fluids/Electrolytes/Nutrition: Routine I's and O's             -continue carb modified diet             -continue vitamin C, zinc, Juven supplements 8: CAD/ Paroxysmal A fib: left heart cath 05/2022; stents to LAD             -Restarted on Eliquis 10/5             -continue Toprol XL 25 mg TID             - Intermittent tachycardia likely d/t poor pain control    9: T2DM c/b peripheral neuropathy: CBGs q AC and q HS             -continue Levemir 8 units daily             -continue Farxiga 10 mg daily             -SSI CBG (last 3)  Recent Labs    07/26/22 2055 07/27/22 0643 07/27/22 1119  GLUCAP 205* 119* 124*   -Overall well controlled  10: Hyperlipidemia: continue Zetia, Lipitor 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 Is/Os negative; follow-up BMP  -10/7 Improved to 132 ,   recheck tomorrow 15. Right foot plantarflexion tone: Order PRAFO on IPR admission.  16. Low albumin  -Continue protein supplement, eating 75-100% meals 17. History of chronic costochondritis/chest wall arthritis  on fentanyl 75  -Lidocaine patch started  LOS: 2 days A FACE TO FACE EVALUATION WAS PERFORMED  Jennye Boroughs 07/27/2022, 11:18 AM

## 2022-07-27 NOTE — Evaluation (Signed)
Physical Therapy Assessment and Plan  Patient Details  Name: Robert Burnett MRN: [id number] Date of Birth: [birth year]  PT Diagnosis: Difficulty walking, Dizziness and giddiness, Edema, Impaired sensation, Low back pain, Pain in joint, and Pain in sternum Rehab Potential: Good ELOS: 7-10 days   Today's Date: 07/26/2022 PT Individual Time:  1053-1205  PT Individual Time Calculation (min): 72 min  Hospital Problem: Principal Problem:   Osteomyelitis of left lower extremity ([place])   Past Medical History:  Past Medical History:  Diagnosis Date   Allergy    Anemia    Arthritis    ARTHRITIS IN SPINE - MAKES PT HAVE CHEST PAIN- USES fENTANYL PATCH    Asthma    IN TEENS    Bacteremia    a. 03/2022 Group B Strep bacteremia in setting of diabetic L foot infxn/gangrene/osteo.   Diabetes mellitus    Diabetic peripheral neuropathy associated with type 2 diabetes mellitus ([place]) 12/12/2011   Fatty liver disease, nonalcoholic 40/81/4481   Foot osteomyelitis, left ([place])    a. 2012 s/p toe amputations on L; b. 03/2022 - admitted w/ wet gangrene and necrotizing infxn w/ osteomyelitis-->s/p transmetatarsal followed by Lisfranc amputation.   GERD (gastroesophageal reflux disease)    H/O degenerative disc disease    History of echocardiogram    a. 03/2022 Echo: EF 55-60%, no rwma, nl RV fxn, mildly elev PASP. Mild MR.   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[facility])    Past Surgical History:  Past Surgical History:  Procedure Laterality Date   ACHILLES TENDON SURGERY Left 03/28/2022   Procedure: ACHILLES LENGTHENING/KIDNER;  Surgeon: Robert Burnett, DPM;  Location: [facility];  Service: Podiatry;  Laterality: Left;   AMPUTATION Left 03/25/2022   Procedure: AMPUTATION DIGITS,TRANSMETARSAL AMPUTATION, REMOVAL OF TOES AND BONE BEHIND TOES ;  Surgeon: Robert Burnett, DPM;  Location: [facility];  Service: Podiatry;  Laterality: Left;   AMPUTATION Left  07/23/2022   Procedure: LEFT BELOW KNEE AMPUTATION;  Surgeon: Robert Minion, MD;  Location: [place];  Service: Orthopedics;  Laterality: Left;   APPLICATION OF WOUND VAC Left 03/25/2022   Procedure: APPLICATION OF WOUND VAC;  Surgeon: Robert Burnett, DPM;  [facility]: [facility];  Service: Podiatry;  Laterality: Left;   CORONARY STENT INTERVENTION N/A 06/05/2022   Procedure: CORONARY STENT INTERVENTION;  Surgeon: Robert Crome, MD;  Location: [place] [facility];  Service: Cardiovascular;  Laterality: N/A;   CYSTOSCOPY/RETROGRADE/URETEROSCOPY Left 10/29/2020   Procedure: CYSTOSCOPY/RETROGRADE/ LEFT URETEROSCOPY WITH BIOPSY, LEFT URETERAL STENT;  Surgeon: Robert Bring, MD;  Location: [facility];  Service: Urology;  Laterality: Left;   IRRIGATION AND DEBRIDEMENT FOOT Left 03/28/2022   Procedure: AMPUTATION LISFRANC, SECONDARY WOUND CLOSURE;  Surgeon: Robert Burnett, DPM;  [facility];  Service: Podiatry;  Laterality: Left;   KNEE SURGERY Bilateral 1975 and 1989   LEFT HEART CATH AND CORONARY ANGIOGRAPHY N/A 06/05/2022   Procedure: LEFT HEART CATH AND CORONARY ANGIOGRAPHY;  Surgeon: Robert Crome, MD;  Location: [place] [facility];  Service: Cardiovascular;  Laterality: N/A;   MULTIPLE TOOTH EXTRACTIONS     ROBOT ASSITED LAPAROSCOPIC NEPHROURETERECTOMY Left 12/13/2020   Procedure: XI ROBOT ASSITED LAPAROSCOPIC NEPHROURETERECTOMY , POST OPERATIVE INTRAVESICAL GEMCITABINE;  Surgeon: Robert Bring, MD;  [facility];  Service: Urology;  Laterality: Left;  ONLY NEEDS 240 MIN   TOEM AMPUTATION  Left  2012, second toe   TRANSURETHRAL RESECTION OF BLADDER TUMOR N/A 08/29/2021   Procedure: TRANSURETHRAL RESECTION OF BLADDER TUMOR (TURBT) WITH CYSTOSCOPY;  Surgeon: Robert Bring, MD;  [facility];  Service: Urology;  Laterality: N/A;  GENERAL ANESTHESIA WITH PARALYSIS   WISDOM TOOTH EXTRACTION      Assessment & Plan Clinical Impression: Patient is a 61 y.o. male With a history of type 2 diabetes  mellitus and osteomyelitis of the left foot.  He presented to [facility] emergency department on 07/22/2022 with complaints of worsening pain and swelling of his left foot.  He endorsed purulent drainage.  Previously undergone toe amputation and amputation Lisfranc and tarsal left foot, delayed secondary closure of surgical wound and Achilles tendon lengthening by Dr. Sherryle Burnett on 03/28/2022.  His medical history is significant for coronary artery disease and underwent left heart catheterization was placed to the LAD by Dr. Daneen Burnett on 817/2023.  He is maintained on Eliquis.  Podiatry was consulted and MRI of the left foot obtained.  He underwent left BKA by Robert Burnett on 10/4.  He required 2 units packed red blood cells secondary to acute blood loss anemia atop anemia of chronic disease.  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  He underwent TURP as well.   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  He was under the care of Robert Burnett for pain management.  The patient states her practice is closed and he has a primary care appointment at [facility] on 10/16.  He had fentanyl patch 75 mcg/h #5 last prescribed on 8/18 by RobertFang Burnett.   His primary complaint today is left residual limb pain, left thigh and hip pain and sternal and rib pain he attributes to the wet weather outside. The patient requires inpatient physical medicine and rehabilitation evaluations and treatment secondary to dysfunction due to left below-knee amputation and chronic pain..  Patient transferred to CIR on 07/25/2022 .   Patient currently requires  CGA  with mobility secondary to muscle weakness and muscle joint tightness, decreased cardiorespiratoy endurance, and decreased sitting balance, decreased standing balance, decreased postural control, and decreased balance strategies.  Prior to hospitalization, patient was modified independent  with  mobility and lived with  (nephew Robert Burnett lives in back of house (apartment)) in a House home.  Home access is two steps - 4" then 6" then thresholdStairs to enter.  Patient will benefit from skilled PT intervention to maximize safe functional mobility, minimize fall risk, and decrease caregiver burden for planned discharge home with intermittent assist.  Anticipate patient will benefit from follow up [facility] at discharge.  PT - End of Session Activity Tolerance: Tolerates 10 - 20 min activity with multiple rests Endurance Deficit: Yes PT Assessment Rehab Potential (ACUTE/IP ONLY): Good PT Barriers to Discharge: [place] home environment;Decreased caregiver support;Home environment access/layout;Wound Care;Lack of/limited family support;Insurance for SNF coverage;Weight bearing restrictions PT Patient demonstrates impairments in the following area(s): Balance;Edema;Endurance;Motor;Nutrition;Pain;Safety;Sensory;Skin Integrity PT Transfers Functional Problem(s): Bed Mobility;Bed to Chair;Car;Furniture PT Locomotion Functional Problem(s): Ambulation;Wheelchair Mobility;Stairs PT Plan PT Intensity: Minimum of 1-2 x/day ,45 to 90 minutes PT Frequency: 5 out of 7 days PT Duration Estimated Length of Stay: 7-10 days PT Treatment/Interventions: Ambulation/gait training;Community reintegration;DME/adaptive equipment instruction;Neuromuscular re-education;Psychosocial support;Stair training;UE/LE Strength taining/ROM;Wheelchair propulsion/positioning;Balance/vestibular training;Discharge planning;Functional electrical stimulation;Pain management;Therapeutic Activities;Skin care/wound management;UE/LE Coordination activities;Cognitive remediation/compensation;Disease management/prevention;Functional mobility training;Patient/family education;Splinting/orthotics;Therapeutic Exercise;Visual/perceptual remediation/compensation PT Transfers Anticipated Outcome(s): Mod I PT Locomotion  Anticipated Outcome(s):  supervision PT Recommendation Recommendations for Other Services: Therapeutic Recreation consult Therapeutic Recreation Interventions: Stress management Follow Up Recommendations: None;24 hour supervision/assistance Patient destination: Home Equipment Recommended: To be determined   PT Evaluation Precautions/Restrictions Precautions Precautions: Fall Precaution Comments: wound vac; watch HR, constant spinal and sternal OA pain Restrictions Weight Bearing Restrictions: Yes LLE Weight Bearing: Non weight bearing Other Position/Activity Restrictions: limb guard when OOB - off when in bed General   Vital Signs  Pain Pain Assessment Pain Scale: 0-10 Pain Score: 8  Pain Type: Acute pain;Surgical pain;Phantom pain Pain Location: Leg Pain Orientation: Left Pain Descriptors / Indicators: Sharp;Aching;Operative site guarding;Discomfort Pain Onset: On-going Patients Stated Pain Goal: 0 Pain Intervention(s): Elevated extremity;Repositioned Multiple Pain Sites: Yes 2nd Pain Site Pain Score: 8 Pain Type: Chronic pain Pain Location: Back Pain Descriptors / Indicators: Constant;Discomfort;Aching Pain Onset: On-going Patient's Stated Pain Goal: 4 Pain Intervention(s): Repositioned Pain Interference Pain Interference Pain Effect on Sleep: 3. Frequently Pain Interference with Therapy Activities: 3. Frequently Pain Interference with Day-to-Day Activities: 3. Frequently Home Living/Prior Functioning Home Living Available Help at Discharge: Available PRN/intermittently;Family (nephew works during day) Type of Home: House Home Access: Stairs to enter [employer] of Steps: two steps - 4" then 6" then threshold Entrance Stairs-Rails: None Home Layout: One level Bathroom Shower/Tub: Tub/shower unit;Curtain Bathroom Toilet: Handicapped height Bathroom Accessibility: Yes Additional Comments: wash sponge bathing after recent toe amputation but prior to that was showering  Lives  With:  (nephew Robert Burnett lives in back of house (apartment)) Prior Function Level of Independence: Independent with basic ADLs;Independent with homemaking with ambulation;Independent with transfers;Independent with gait  Able to Take Stairs?: Yes Vocation: On disability Vision/Perception  Vision - History Ability to See in Adequate Light: 0 Adequate Vision - Assessment Eye Alignment: Within Functional Limits Ocular Range of Motion: Within Functional Limits Alignment/Gaze Preference: Within Defined Limits Tracking/Visual Pursuits: Able to track stimulus in all quads without difficulty Perception Perception: Within Functional Limits Praxis Praxis: Intact  Cognition Overall Cognitive Status: Within Functional Limits for tasks assessed Arousal/Alertness: Awake/alert Orientation Level: Oriented X4 Year: 2023 Month: October Day of Week: Correct Attention: Focused Focused Attention: Appears intact Memory: Appears intact Awareness: Appears intact Problem Solving: Appears intact Safety/Judgment: Appears intact Sensation Sensation Light Touch: Appears Intact Additional Comments: Numbness in R foot Coordination Gross Motor Movements are Fluid and Coordinated: No Fine Motor Movements are Fluid and Coordinated: No Coordination and Movement Description: decreased, difficutly holding items Motor  Motor Motor: Within Functional Limits Motor - Skilled Clinical Observations: delayed d/t pain/ guarding   Trunk/Postural Assessment  Cervical Assessment Cervical Assessment: Exceptions to [facility] (forward head) Thoracic Assessment Thoracic Assessment: Exceptions to [facility] (rounded shoulders) Lumbar Assessment Lumbar Assessment: Exceptions to [facility] (posterior pelvic tilt) Postural Control Postural Control: Within Functional Limits (delayed)  Balance Static Sitting Balance Static Sitting - Balance Support: Feet supported Static Sitting - Level of Assistance: 6: Modified independent (Device/Increase  time);5: Stand by assistance Dynamic Sitting Balance Dynamic Sitting - Balance Support: Feet supported;During functional activity Dynamic Sitting - Level of Assistance: 5: Stand by assistance Dynamic Sitting - Balance Activities: Reaching for objects;Reaching across midline Static Standing Balance Static Standing - Balance Support: During functional activity;Bilateral upper extremity supported Static Standing - Level of Assistance: Other (comment) (CGA) Dynamic Standing Balance Dynamic Standing - Balance Support: Bilateral upper extremity supported;During functional activity Dynamic Standing - Level of Assistance: Other (comment) (CGA) Dynamic Standing - Balance Activities: Reaching for objects;Reaching across midline Extremity Assessment      RLE  Assessment RLE Assessment: Within Functional Limits LLE Assessment LLE Assessment: Exceptions to [facility] General Strength Comments: hip and knee strength limited from pain - grossly 4- to 4/5  Care Tool Care Tool Bed Mobility Roll left and right activity   Roll left and right assist level: Supervision/Verbal cueing    Sit to lying activity   Sit to lying assist level: Contact Guard/Touching assist    Lying to sitting on side of bed activity   Lying to sitting on side of bed assist level: the ability to move from lying on the back to sitting on the side of the bed with no back support.: Contact Guard/Touching assist     Care Tool Transfers Sit to stand transfer   Sit to stand assist level: Contact Guard/Touching assist    Chair/bed transfer   Chair/bed transfer assist level: Contact Guard/Touching assist     Toilet transfer   Assist Level: Contact Guard/Touching assist    Car transfer Car transfer activity did not occur: Safety/medical concerns        Care Tool Locomotion Ambulation   Assist level: Contact Guard/Touching assist Assistive device: Walker-rolling Max distance: 10 ft  Walk 10 feet activity   Assist level: Contact  Guard/Touching assist Assistive device: Walker-rolling   Walk 50 feet with 2 turns activity Walk 50 feet with 2 turns activity did not occur: Safety/medical concerns      Walk 150 feet activity Walk 150 feet activity did not occur: Safety/medical concerns      Walk 10 feet on uneven surfaces activity Walk 10 feet on uneven surfaces activity did not occur: Safety/medical concerns      Stairs Stair activity did not occur: Safety/medical concerns        Walk up/down 1 step activity Walk up/down 1 step or curb (drop down) activity did not occur: Safety/medical concerns      Walk up/down 4 steps activity Walk up/down 4 steps activity did not occur: Safety/medical concerns      Walk up/down 12 steps activity Walk up/down 12 steps activity did not occur: Safety/medical concerns      Pick up small objects from floor Pick up small object from the floor (from standing position) activity did not occur: Safety/medical concerns      Wheelchair Is the patient using a wheelchair?: Yes Type of Wheelchair: Manual   Wheelchair assist level: Maximal Assistance - Patient 25 - 49% Max wheelchair distance: 10 ft  Wheel 50 feet with 2 turns activity Wheelchair 50 feet with 2 turns activity did not occur: Safety/medical concerns    Wheel 150 feet activity Wheelchair 150 feet activity did not occur: Safety/medical concerns      Refer to Care Plan for Long Term Goals  SHORT TERM GOAL WEEK 1 PT Short Term Goal 1 (Week 1): STG = LTG d/t ELOS  Recommendations for other services: Therapeutic Recreation  Kitchen group and Stress management  Skilled Therapeutic Intervention Mobility Bed Mobility Bed Mobility: Rolling Right;Supine to Sit;Sit to Supine Rolling Right: Supervision/verbal cueing Supine to Sit: Contact Guard/Touching assist;Supervision/Verbal cueing Sit to Supine: Supervision/Verbal cueing Transfers Transfers: Sit to Stand;Stand to Sit;Stand Pivot Transfers Sit to Stand: Contact  Guard/Touching assist Stand to Sit: Contact Guard/Touching assist Stand Pivot Transfers: Contact Guard/Touching assist Transfer (Assistive device): Rolling walker Locomotion  Gait Ambulation: Yes Gait Assistance: Contact Guard/Touching assist Gait Distance (Feet): 10 Feet Assistive device: Rolling walker Gait Gait: Yes Gait velocity: decreased Wheelchair Mobility Wheelchair Mobility: Yes Wheelchair Assistance: Development worker, international aid: Both  upper extremities Wheelchair Parts Management: Supervision/cueing  Skilled Intervention: PT Evaluation completed; see above for results. PT educated patient in roles of PT vs OT, PT POC, rehab potential, rehab goals, and discharge recommendations along with recommendation for follow-up rehabilitation services. Education provided re: future prosthetic, use of shrinker sock, use of wound vac, phantom limb pain and need for gentle palpation during healing in order to improve sensation and decrease pain.    Individual treatment initiated:  Patient supine in bed upon PT arrival. Patient alert and agreeable to PT session. Pain complaint during session.  Therapeutic Activity: Bed Mobility: Patient performed supine to/from sit slowly but with care and CGA using bedrails as necessary. Provided min vc for technique. Transfers: Patient performed sit <> stand at EOB to RW with MinA and improving to CGA throughout. Provided vc/tc fortechnique in scooting toward EOB and for use of UE to control descent.  Gait Training:  Patient ambulated short distances within room using RW with MinA/ CGA. Ambulated with good control and BUE/ trunk strength. Provided vc/tc for maintaining balance and soft landing. .  Neuromuscular Re-ed: NMR facilitated during session with focus on sitting and  standing balance. Pt guided in improving slow sit<>stand with proper technique. Challenged in seated balance at EOB with close supervision. NMR performed for  improvements in motor control and coordination, balance, sequencing, judgement, and self confidence/ efficacy in performing all aspects of mobility at highest level of independence.   Patient supine  in bed at end of session with brakes locked, bed alarm set, and all needs within reach.   Discharge Criteria: Patient will be discharged from PT if patient refuses treatment 3 consecutive times without medical reason, if treatment goals not met, if there is a change in medical status, if patient makes no progress towards goals or if patient is discharged from hospital.  The above assessment, treatment plan, treatment alternatives and goals were discussed and mutually agreed upon: by patient  Alger Simons 07/26/2022, 6:26 PM

## 2022-07-28 DIAGNOSIS — M869 Osteomyelitis, unspecified: Secondary | ICD-10-CM | POA: Diagnosis not present

## 2022-07-28 LAB — BASIC METABOLIC PANEL
Anion gap: 8 (ref 5–15)
BUN: 20 mg/dL (ref 8–23)
CO2: 27 mmol/L (ref 22–32)
Calcium: 9.1 mg/dL (ref 8.9–10.3)
Chloride: 98 mmol/L (ref 98–111)
Creatinine, Ser: 1.23 mg/dL (ref 0.61–1.24)
GFR, Estimated: >60 mL/min (ref >60)
Glucose, Bld: 115 mg/dL — ABNORMAL HIGH (ref 70–99)
Potassium: 4.6 mmol/L (ref 3.5–5.1)
Sodium: 133 mmol/L — ABNORMAL LOW (ref 135–145)

## 2022-07-28 LAB — CBC
HCT: 26.8 % — ABNORMAL LOW (ref 39.0–52.0)
Hemoglobin: 8.8 g/dL — ABNORMAL LOW (ref 13.0–17.0)
MCH: 26.6 pg (ref 26.0–34.0)
MCHC: 32.8 g/dL (ref 30.0–36.0)
MCV: 81 fL (ref 80.0–100.0)
Platelets: 218 10*3/uL (ref 150–400)
RBC: 3.31 MIL/uL — ABNORMAL LOW (ref 4.22–5.81)
RDW: 14.9 % (ref 11.5–15.5)
WBC: 7.9 10*3/uL (ref 4.0–10.5)
nRBC: 0 % (ref 0.0–0.2)

## 2022-07-28 LAB — GLUCOSE, CAPILLARY
Glucose-Capillary: 125 mg/dL — ABNORMAL HIGH (ref 70–99)
Glucose-Capillary: 100 mg/dL — ABNORMAL HIGH (ref 70–99)
Glucose-Capillary: 111 mg/dL — ABNORMAL HIGH (ref 70–99)
Glucose-Capillary: 135 mg/dL — ABNORMAL HIGH (ref 70–99)
Glucose-Capillary: 176 mg/dL — ABNORMAL HIGH (ref 70–99)

## 2022-07-28 LAB — VITAMIN D 25 HYDROXY (VIT D DEFICIENCY, FRACTURES): Vit D, 25-Hydroxy: 17.42 ng/mL — ABNORMAL LOW (ref 30–100)

## 2022-07-28 LAB — SURGICAL PATHOLOGY

## 2022-07-28 MED ORDER — MAGNESIUM GLUCONATE 500 MG PO TABS
250.0000 mg | ORAL_TABLET | Freq: Every day | ORAL | Status: DC
  Administered 2022-07-28 – 2022-08-06 (×10): 250 mg via ORAL
  Filled 2022-07-28 (×10): qty 1

## 2022-07-28 MED ORDER — GABAPENTIN 100 MG PO CAPS
200.0000 mg | ORAL_CAPSULE | Freq: Three times a day (TID) | ORAL | Status: DC
  Administered 2022-07-28 – 2022-08-07 (×31): 200 mg via ORAL
  Filled 2022-07-28 (×31): qty 2

## 2022-07-28 NOTE — Progress Notes (Signed)
PROGRESS NOTE   Subjective/Complaints: Complains of residual limb and phantom limb pain. Feels the fentanyl patch helps his pain the most. He does not feel sleepy from the gabapentin and is currently taking '100mg'$  TID.   ROS: +residual limb and phantom limb pain   Review of Systems  Constitutional:  Negative for chills and fever.  Respiratory:  Negative for shortness of breath.   Cardiovascular:  Negative for chest pain.       Chest wall soreness  Gastrointestinal:  Negative for abdominal pain, diarrhea, nausea and vomiting.  Musculoskeletal:  Positive for back pain and joint pain.     Objective:   No results found. Recent Labs    07/26/22 0610 07/28/22 0610  WBC 4.9 7.9  HGB 8.5* 8.8*  HCT 25.7* 26.8*  PLT 196 218    Recent Labs    07/26/22 0610 07/28/22 0610  NA 132* 133*  K 3.8 4.6  CL 102 98  CO2 23 27  GLUCOSE 124* 115*  BUN 19 20  CREATININE 1.05 1.23  CALCIUM 8.2* 9.1     Intake/Output Summary (Last 24 hours) at 07/28/2022 0951 Last data filed at 07/28/2022 5038 Gross per 24 hour  Intake 580 ml  Output 1300 ml  Net -720 ml         Physical Exam: Vital Signs Blood pressure (!) 141/70, pulse 67, temperature 98.4 F (36.9 C), temperature source Oral, resp. rate 16, height '6\' 1"'$  (1.854 m), weight 80.9 kg, SpO2 100 %.    General: Alert and oriented x 3, No apparent distress HEENT: Head is normocephalic, atraumatic, PERRLA, EOMI, sclera anicteric, oral mucosa pink and moist Neck: Supple without JVD or lymphadenopathy Heart: Reg rate and rhythm. No murmurs rubs or gallops Chest: CTA bilaterally without wheezes, rales, or rhonchi; no distress Chest wall and sternal tenderness Abdomen: Soft, non-tender, non-distended, bowel sounds positive. Extremities: No clubbing, cyanosis, or edema. Pulses are 2+ Psych: Pt's affect is appropriate. Pt is cooperative Skin: warm and dry, wound vac in  place Musculoskeletal:     Cervical back: Neck supple.     Comments: Active pressure dressing in place left residual limb with good seal.      LLE - 3+/5 hip flexion, limited by pain     RLE, BL UE - 5/5 throughout     + R foot resting plantarflexion tone Neurological:     Sensation: Decreased to light touch over right foot, ankle    Mental Status: He is alert and oriented to person, place, and time.    Assessment/Plan: 1. Functional deficits which require 3+ hours per day of interdisciplinary therapy in a comprehensive inpatient rehab setting. Physiatrist is providing close team supervision and 24 hour management of active medical problems listed below. Physiatrist and rehab team continue to assess barriers to discharge/monitor patient progress toward functional and medical goals  Care Tool:  Bathing    Body parts bathed by patient: Right arm, Left arm, Chest, Abdomen, Front perineal area, Buttocks, Right upper leg, Left upper leg, Right lower leg, Face     Body parts n/a: Left lower leg   Bathing assist Assist Level: Contact Guard/Touching assist  Upper Body Dressing/Undressing Upper body dressing   What is the patient wearing?: Pull over shirt    Upper body assist Assist Level: Supervision/Verbal cueing    Lower Body Dressing/Undressing Lower body dressing      What is the patient wearing?: Underwear/pull up     Lower body assist Assist for lower body dressing: Minimal Assistance - Patient > 75% (Manage wound vac)     Toileting Toileting    Toileting assist Assist for toileting: Contact Guard/Touching assist     Transfers Chair/bed transfer  Transfers assist     Chair/bed transfer assist level: Contact Guard/Touching assist     Locomotion Ambulation   Ambulation assist      Assist level: Contact Guard/Touching assist Assistive device: Walker-rolling Max distance: 33f   Walk 10 feet activity   Assist     Assist level: Contact  Guard/Touching assist Assistive device: Walker-rolling   Walk 50 feet activity   Assist Walk 50 feet with 2 turns activity did not occur: Safety/medical concerns         Walk 150 feet activity   Assist Walk 150 feet activity did not occur: Safety/medical concerns         Walk 10 feet on uneven surface  activity   Assist Walk 10 feet on uneven surfaces activity did not occur: Safety/medical concerns         Wheelchair     Assist Is the patient using a wheelchair?: Yes Type of Wheelchair: Manual    Wheelchair assist level: Supervision/Verbal cueing Max wheelchair distance: 1532f   Wheelchair 50 feet with 2 turns activity    Assist    Wheelchair 50 feet with 2 turns activity did not occur: Safety/medical concerns   Assist Level: Supervision/Verbal cueing   Wheelchair 150 feet activity     Assist  Wheelchair 150 feet activity did not occur: Safety/medical concerns   Assist Level: Supervision/Verbal cueing   Blood pressure (!) 141/70, pulse 67, temperature 98.4 F (36.9 C), temperature source Oral, resp. rate 16, height '6\' 1"'$  (1.854 m), weight 80.9 kg, SpO2 100 %.  Medical Problem List and Plan: 1. Functional deficits secondary to left below the knee amputation, osteoarthritis with chronic pain.             -patient may not shower             -ELOS/Goals: 7-10 days  -Continue CIR  -PRAFO has been ordered on right 2.  Antithrombotics: -DVT/anticoagulation:  Pharmaceutical: Eliquis             -antiplatelet therapy: Plavix 3. Pain Management (acute post-op, chronic chest/back): Fentanyl patch.  Oxycodone, Robaxin, Tylenol as needed             - Continue Fentanyl patch 75 mcg/hr OP medication, per PDMP last filled 06/06/22 by Dr. FaFlorencia [facility] (15 days supply)                       - Restarted inpatient 10/6                       - Of note, documented insurance denial for further patches 8/21 d/t no pain contract  Increase gabapentin  to '200mg'$  TID   4. Mood/Behavior/Sleep: LCSW to evaluate and provide emotional support             -antipsychotic agents: None   5. Neuropsych/cognition: This patient is capable of making decisions on his  own behalf. 6. Skin/Wound Care: Routine skin care checks             -VAC dressing can come down 10/11   7. Fluids/Electrolytes/Nutrition: Routine I's and O's             -continue carb modified diet             -continue vitamin C, zinc, Juven supplements 8: CAD/ Paroxysmal A fib: left heart cath 05/2022; stents to LAD             -Restarted on Eliquis 10/5             -continue Toprol XL 25 mg TID             - Intermittent tachycardia likely d/t poor pain control    9: T2DM c/b peripheral neuropathy: CBGs q AC and q HS             -continue Levemir 8 units daily             -continue Farxiga 10 mg daily             -SSI CBG (last 3)  Recent Labs    07/27/22 1627 07/27/22 2038 07/28/22 0614  GLUCAP 131* 183* 100*    -Overall well controlled  10: Hyperlipidemia: continue Zetia, Lipitor 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 Is/Os negative; follow-up BMP  -10/7 Improved to 132 ,   recheck tomorrow 15. Right foot plantarflexion tone: Order PRAFO on IPR admission.  16. Low albumin  -Continue protein supplement, eating 75-100% meals 17. History of chronic costochondritis/chest wall arthritis  on fentanyl 75  -Lidocaine patch started 18. Screening for vitamin D deficiency: add on vitamin D level today.  19. Suboptimal magnesium level: start magneisum gluconate '250mg'$  HS  LOS: 3 days A FACE TO FACE EVALUATION WAS PERFORMED  Martha Clan P Quenton Recendez 07/28/2022, 9:51 AM

## 2022-07-28 NOTE — Progress Notes (Signed)
Inpatient Rehabilitation  Patient information reviewed and entered into eRehab system by Jameir Ake M. Carlon Chaloux, M.A., CCC/SLP, PPS Coordinator.  Information including medical coding, functional ability and quality indicators will be reviewed and updated through discharge.    

## 2022-07-28 NOTE — Progress Notes (Signed)
Physical Therapy Session Note  Patient Details  Name: Robert Burnett MRN: [id number] Date of Birth: 1961-07-03  Today's Date: 07/28/2022 PT Individual Time: [phone] and [phone] PT Individual Time Calculation (min): 71 min and 56 min  Short Term Goals: Week 1:  PT Short Term Goal 1 (Week 1): STG = LTG d/t ELOS  Skilled Therapeutic Interventions/Progress Updates:   Treatment Session 1 Received pt semi-reclined in recliner, pt agreeable to PT treatment, and reported pain 7-8/10 in L residual limb (premedicated). Pt required increased time to answer questions this morning, reporting when the pain is this bad it's hard for him to think. Session with emphasis on functional mobility/transfers, dressing, generalized strengthening and endurance, dynamic standing balance/coordination, and gait training. Pt voided in urinal while seated then therapist disconnected wound vac, donned scrub pants seated with max A, and stood with RW and CGA and required max A to pull pants over hips. Reconnected wound vac then pt transferred recliner<>WC stand<>pivot with RW and CGA and sat in WC at sink and brushed teeth/washed face with set up assist.   Donned L limb guard with mod A and pt performed WC mobility 184f using BUE and supervision to main therapy gym. Applied coband and foam to L amputee support pad to build up and promote further extension - educated pt on importance of keeping L residual limb in extension to prevent contractures. Pt transported to dayroom and transferred sit<>stand with RW and CGA and pt ambulated 268fx 2 trials with RW and CGA with close WC follow. Pt demonstrated excellent ability to land softly on heel with only 1 instance of R toe catching. Discussed prosthetic fitting timeline and home environment/setup. Pt reported having 2 3in steps to enter with no railings and having a WC, RW, and crutches at home. Pt reports his nephew lives with him and will be available at night but can try to  take time off work if needed. [facility] and requested to sit in recliner - stand<>pivot WC<>recliner with RW and CGA and concluded session with pt sitting in recliner with all needs within reach.   Treatment Session 2 Received pt sitting in recliner eating lunch. Pt agreeable to PT treatment and reported pain 4-5/10 in L residual limb and low back and requesting pain patch - asked RN and RN reported pt not due for another patch yet. Session with emphasis on functional mobility/transfers, generalized strengthening and endurance, and dynamic standing balance/coordination. Pt voided in urinal while seated with set up assist and donned limb guard with supervision, increased time, and cues for technique. Pt transferred recliner<>WC stand<>pivot with RW and CGA and [facility] time management purposes. Pt stood at table in dayroom with CGA and worked on dynamic standing balance, problem solving, and fine motor control copying picture on pegboard for 2 minutes with CGA for balance - pt with 100% accuracy. Of note, pt did have difficulty with word finding throughout session but reports this is baseline. Pt performed WC mobility 15031fsing BUE and supervision and transported remainder of way back to room dependently. Pt transferred WC<>recliner stand<>pivot with RW and CGA and removed limb guard and placed pillow under L residual limb for comfort. Concluded session with pt sitting in recliner with all needs within reach.    Therapy Documentation Precautions:  Precautions Precautions: Fall Precaution Comments: wound vac; watch HR, constant spinal and sternal OA pain Restrictions Weight Bearing Restrictions: Yes LLE Weight Bearing: Non  weight bearing Other Position/Activity Restrictions: limb guard when OOB - off when in bed  Therapy/Group: Individual Therapy Alfonse Alpers PT, DPT  07/28/2022, 6:53 AM

## 2022-07-28 NOTE — Progress Notes (Signed)
Inpatient Rehabilitation Care Coordinator Assessment and Plan Patient Details  Name: Robert Burnett MRN: [id number] Date of Birth: 30-Apr-1961  Today's Date: 07/28/2022  Hospital Problems: Principal Problem:   Osteomyelitis of left lower extremity [facility])  Past Medical History:  Past Medical History:  Diagnosis Date   Allergy    Anemia    Arthritis    ARTHRITIS IN SPINE - MAKES PT HAVE CHEST PAIN- USES fENTANYL PATCH    Asthma    IN TEENS    Bacteremia    a. 03/2022 Group B Strep bacteremia in setting of diabetic L foot infxn/gangrene/osteo.   Diabetes mellitus    Diabetic peripheral neuropathy associated with type 2 diabetes mellitus ([place]) 12/12/2011   Fatty liver disease, nonalcoholic 48/54/6270   Foot osteomyelitis, left ([place])    a. 2012 s/p toe amputations on L; b. 03/2022 - admitted w/ wet gangrene and necrotizing infxn w/ osteomyelitis-->s/p transmetatarsal followed by Lisfranc amputation.   GERD (gastroesophageal reflux disease)    H/O degenerative disc disease    History of echocardiogram    a. 03/2022 Echo: EF 55-60%, no rwma, nl RV fxn, mildly elev PASP. Mild MR.   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[facility])    Past Surgical History:  Past Surgical History:  Procedure Laterality Date   ACHILLES TENDON SURGERY Left 03/28/2022   Procedure: ACHILLES LENGTHENING/KIDNER;  Surgeon: [name], DPM;  Location: [facility];  Service: Podiatry;  Laterality: Left;   AMPUTATION Left 03/25/2022   Procedure: AMPUTATION DIGITS,TRANSMETARSAL AMPUTATION, REMOVAL OF TOES AND BONE BEHIND TOES ;  Surgeon: [name], DPM;  Location: [facility];  Service: Podiatry;  Laterality: Left;   AMPUTATION Left 07/23/2022   Procedure: LEFT BELOW KNEE AMPUTATION;  Surgeon: [name], MD;  Location: [place];  Service: Orthopedics;  Laterality: Left;   APPLICATION OF WOUND VAC Left 03/25/2022   Procedure: APPLICATION OF WOUND VAC;  Surgeon:  [name], DPM;  [facility];  Service: Podiatry;  Laterality: Left;   CORONARY STENT INTERVENTION N/A 06/05/2022   Procedure: CORONARY STENT INTERVENTION;  Surgeon: Belva Crome, MD;  Location: [place] [facility];  Service: Cardiovascular;  Laterality: N/A;   CYSTOSCOPY/RETROGRADE/URETEROSCOPY Left 10/29/2020   Procedure: CYSTOSCOPY/RETROGRADE/ LEFT URETEROSCOPY WITH BIOPSY, LEFT URETERAL STENT;  Surgeon: Raynelle Bring, MD;  [facility];  Service: Urology;  Laterality: Left;   IRRIGATION AND DEBRIDEMENT FOOT Left 03/28/2022   Procedure: AMPUTATION LISFRANC, SECONDARY WOUND CLOSURE;  Surgeon: [name], DPM;  [facility];  Service: Podiatry;  Laterality: Left;   KNEE SURGERY Bilateral 1975 and 1989   LEFT HEART CATH AND CORONARY ANGIOGRAPHY N/A 06/05/2022   Procedure: LEFT HEART CATH AND CORONARY ANGIOGRAPHY;  Surgeon: Belva Crome, MD;  Location: [place] [facility];  Service: Cardiovascular;  Laterality: N/A;   MULTIPLE TOOTH EXTRACTIONS     ROBOT ASSITED LAPAROSCOPIC NEPHROURETERECTOMY Left 12/13/2020   Procedure: XI ROBOT ASSITED LAPAROSCOPIC NEPHROURETERECTOMY , POST OPERATIVE INTRAVESICAL GEMCITABINE;  Surgeon: Raynelle Bring, MD;  [facility];  Service: Urology;  Laterality: Left;  ONLY NEEDS 240 MIN   TOEM AMPUTATION  Left    2012, second toe   TRANSURETHRAL RESECTION OF BLADDER TUMOR N/A 08/29/2021   Procedure: TRANSURETHRAL RESECTION OF BLADDER TUMOR (TURBT) WITH CYSTOSCOPY;  Surgeon: Raynelle Bring, MD;  [facility];  Service: Urology;  Laterality: N/A;  GENERAL  ANESTHESIA WITH PARALYSIS   WISDOM TOOTH EXTRACTION     Social History:  reports that he has been smoking pipe and cigarettes. He has a 10.00 pack-year smoking history. He has quit using smokeless tobacco.  His smokeless tobacco use included chew. He reports that he does not drink alcohol and does not use drugs.  Family / Support Systems Marital Status: Single Spouse/Significant Other:  N/A Children: No children Other Supports: cousin [name] and his wife Cherice Anticipated Caregiver: nephew [name] Ability/Limitations of Caregiver: Pt nephew [name] will help PRN/evening since he works during the day. Pt reports his cousin [name] and his wife Cherice will help him and only live 40m from their home. 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 Education: high school grad Health Literacy - How often do you need to have someone help you when you read instructions, pamphlets, or other written material from your doctor or pharmacy?: Never Writes: Yes Employment Status: Disabled Date Retired/Disabled/Unemployed: Pt reports he is disabled due to medical reasons. He does no get SSDI. Legal History/Current Legal Issues: Denies Guardian/Conservator: N/A   Abuse/Neglect Abuse/Neglect Assessment Can Be Completed: Yes Physical Abuse: Denies Verbal Abuse: Denies Sexual Abuse: Denies Exploitation of patient/patient's resources: Denies Self-Neglect: Denies  Patient response to: Social Isolation - How often do you feel lonely or isolated from those around you?: Rarely  Emotional Status Pt's affect, behavior and adjustment status: Pt was in good spirits at time of visit. He reported he was dealing with pain at his residual limb that with wound vac. SW encouraged him to speak with physician and nursign for medication management. Recent Psychosocial Issues: Denies Psychiatric History: Pt reports depression in past due to pain from wear and tear on his body. States he no longer takes and medications. Substance Abuse History: Denies  Patient / Family Perceptions, Expectations & Goals Pt/Family understanding of illness & functional limitations: Pt and family have  a general understanding of pt care needs Premorbid pt/family roles/activities: Independent Anticipated changes in roles/activities/participation: Assistance with ADLs/IADLs Pt/family expectations/goals: Pt goal " getting to where I can get back to a little bit of life without having to ask everyone to help me."  [employer] None Premorbid Home Care/DME Agencies: None Transportation available at discharge: TBD Is the patient able to respond to transportation needs?: Yes In the past 12 months, has lack of transportation kept you from medical appointments or from getting medications?: No In the past 12 months, has lack of transportation kept you from meetings, work, or from getting things needed for daily living?: No Resource referrals recommended: Neuropsychology  Discharge Planning Living Arrangements: Alone, Other relatives Support Systems: Other relatives Type of Residence: Private residence Insurance Resources: PMultimedia programmer(specify) (Psychologist, counselling Financial Resources: Family Support Financial Screen Referred: No Living Expenses: Own Money Management: Patient Does the patient have any problems obtaining your medications?: No Home Management: Pt and his nephew manage home care needs Patient/Family Preliminary Plans: TBD Care Coordinator Barriers to Discharge: Insurance for SNF coverage, Lack of/limited family support, Decreased caregiver support Care Coordinator Anticipated Follow Up Needs: HH/OP Expected length of stay: 7-14 days  Clinical Impression This SW covering for primary SW, [employer]   SW met with pt in room to introduce self, explain role, and discuss discharge process. Pt is not a vEnglish as a second language teacher Pt  states he has an HCPOA but unable to locate it. States it has his cousins [name] and [place] and nephew [name]. SW shared if he would like to complete again to let us know. DME: RW, crutches, and cane. SW discussed with pt in length about applying  for SSI or SSDI (his preference), and follow\ing up with his Medicaid worker to see if he is now eligible for full Medicaid. He would like SW to follow-up with his cousin [name] or Education officer, environmental.   1101-SW unable to leave message for [name] as he doe snot have VM setup. SW unable to leave message for pt nephew [name] as [place] is full. SW spoke with [place]. States her husband [name] is sitting with her. SW discussed above. SW discussed transportation for appointments being ideal. States they will be able to take him and check in on him. SW shared above discuss with regard to options about SSI/SSDI/Medicaid. She would like follow-up moving forward. She was given contact information for pt assigned SW and informed there will be follow-up after team conference.   Ayani Ospina A Kayleb Warshaw 07/28/2022, 12:12 PM

## 2022-07-28 NOTE — Care Management (Signed)
[facility] Individual Statement of Services  Patient Name:  Robert Burnett  Date:  07/28/2022  Welcome to the [facility].  Our goal is to provide you with an individualized program based on your diagnosis and situation, designed to meet your specific needs.  With this comprehensive rehabilitation program, you will be expected to participate in at least 3 hours of rehabilitation therapies Monday-Friday, with modified therapy programming on the weekends.  Your rehabilitation program will include the following services:  Physical Therapy (PT), Occupational Therapy (OT), 24 hour per day rehabilitation nursing, Therapeutic Recreaction (TR), Psychology, Neuropsychology, Care Coordinator, Rehabilitation Medicine, [place], and Other  Weekly team conferences will be held on Tuesdays to discuss your progress.  Your Inpatient Rehabilitation Care Coordinator will talk with you frequently to get your input and to update you on team discussions.  Team conferences with you and your family in attendance may also be held.  Expected length of stay: 7- 14 days    Overall anticipated outcome: Modified Independent to Supervision  Depending on your progress and recovery, your program may change. Your Inpatient Rehabilitation Care Coordinator will coordinate services and will keep you informed of any changes. Your Inpatient Rehabilitation Care Coordinator's name and contact numbers are listed  below.  The following services may also be recommended but are not provided by the [facility] will be made to provide these services after discharge if needed.  Arrangements include referral to agencies that provide these services.  Your insurance has been verified to be:  [place]  Your primary  doctor is:  Psychologist, educational  Pertinent information will be shared with your doctor and your insurance company.  Inpatient Rehabilitation Care Coordinator:  Erlene Quan, [place] or [phone]  Information discussed with and copy given to patient by: Rana Snare, 07/28/2022, 8:34 AM

## 2022-07-28 NOTE — Progress Notes (Signed)
Occupational Therapy Session Note  Patient Details  Name: Robert Burnett MRN: [id number] Date of Birth: November 27, 1960  Today's Date: 07/28/2022 OT Individual Time: 1020-1105 OT Individual Time Calculation (min): 45 min  and Today's Date: 07/28/2022 OT Missed Time: 15 Minutes Missed Time Reason: Other (comment) (SW with pt)   Short Term Goals: Week 1:  OT Short Term Goal 1 (Week 1): Pt will be CGA for LB dressing OT Short Term Goal 2 (Week 1): Pt will be SBA for functional transfers OT Short Term Goal 3 (Week 1): Pt will demonstrate imporved pain managmeent with techniques utilized OT Short Term Goal 4 (Week 1): Pt will completes UB ADLs with Mod I  Skilled Therapeutic Interventions/Progress Updates:  Skilled OT intervention completed with focus on education of OT purpose, functional transfers and simulated toileting activity.   Pt received seated in recliner, with CSW present reviewing background assist and PLOF details, therefore pt missed 20 mins of OT intervention. Will attempt to make up missed time as able.  Pt was able to manage urinary void needs in recliner with set up A with use of urinal. Limb guard already donned. Completed squat pivot with CGA from recliner > w/c with assist needed for wound vac management to prevent entanglement. Self-propelled in w/c <> gym with supervision. Squat pivot > EOM with supervision.   Completed sit > stand using RW with CGA from low level mat, with pt able to release unilateral UE intermittently with CGA for balance. Pt was able to retrieve high and low clips on posterior peri-area to simulate hygiene and toileting needs at the stance level with CGA-supervision for balance with unilateral UE support on RW. Stand hop transfer with CGA using RW to w/c with cues needed for positioning to prevent wound vac entanglement. Education provided on wound vac purpose and typical progression of removal as well as more available ADLs at the shower level upon it's  removal.  Back in room, pt completed step hop transfer at same assist to recliner. Remained seated in recliner, with RLE and L residual limb elevated, fresh ice/diet ginger ale per request, nurse notified of pain med request and all needs met at end of session.    Therapy Documentation Precautions:  Precautions Precautions: Fall Precaution Comments: wound vac; watch HR, constant spinal and sternal OA pain Restrictions Weight Bearing Restrictions: Yes LLE Weight Bearing: Non weight bearing Other Position/Activity Restrictions: limb guard when OOB - off when in bed    Therapy/Group: Individual Therapy  [name], MS, OTR/L  07/28/2022, 12:19 PM

## 2022-07-28 NOTE — Plan of Care (Signed)
  Problem: RH Balance Goal: LTG Patient will maintain dynamic sitting balance (PT) Description: LTG:  Patient will maintain dynamic sitting balance with assistance during mobility activities (PT) Flowsheets (Taken 07/26/2022 1817) LTG: Pt will maintain dynamic sitting balance during mobility activities with:: Independent with assistive device  Goal: LTG Patient will maintain dynamic standing balance (PT) Description: LTG:  Patient will maintain dynamic standing balance with assistance during mobility activities (PT) Flowsheets (Taken 07/26/2022 1817) LTG: Pt will maintain dynamic standing balance during mobility activities with:: Supervision/Verbal cueing   Problem: Sit to Stand Goal: LTG:  Patient will perform sit to stand with assistance level (PT) Description: LTG:  Patient will perform sit to stand with assistance level (PT) Flowsheets (Taken 07/26/2022 1817) LTG: PT will perform sit to stand in preparation for functional mobility with assistance level: Supervision/Verbal cueing   Problem: RH Bed Mobility Goal: LTG Patient will perform bed mobility with assist (PT) Description: LTG: Patient will perform bed mobility with assistance, with/without cues (PT). Flowsheets (Taken 07/26/2022 1817) LTG: Pt will perform bed mobility with assistance level of: Independent with assistive device    Problem: RH Bed to Chair Transfers Goal: LTG Patient will perform bed/chair transfers w/assist (PT) Description: LTG: Patient will perform bed to chair transfers with assistance (PT). Flowsheets (Taken 07/26/2022 1817) LTG: Pt will perform Bed to Chair Transfers with assistance level: Supervision/Verbal cueing   Problem: RH Car Transfers Goal: LTG Patient will perform car transfers with assist (PT) Description: LTG: Patient will perform car transfers with assistance (PT). Flowsheets (Taken 07/26/2022 1817) LTG: Pt will perform car transfers with assist:: Contact Guard/Touching assist   Problem: RH  Furniture Transfers Goal: LTG Patient will perform furniture transfers w/assist (OT/PT) Description: LTG: Patient will perform furniture transfers  with assistance (OT/PT). Flowsheets (Taken 07/26/2022 1817) LTG: Pt will perform furniture transfers with assist:: Supervision/Verbal cueing   Problem: RH Ambulation Goal: LTG Patient will ambulate in controlled environment (PT) Description: LTG: Patient will ambulate in a controlled environment, # of feet with assistance (PT). Flowsheets (Taken 07/26/2022 1817) LTG: Pt will ambulate in controlled environ  assist needed:: Supervision/Verbal cueing LTG: Ambulation distance in controlled environment: 60 feet using LRAD Goal: LTG Patient will ambulate in home environment (PT) Description: LTG: Patient will ambulate in home environment, # of feet with assistance (PT). Flowsheets (Taken 07/26/2022 1817) LTG: Pt will ambulate in home environ  assist needed:: Supervision/Verbal cueing LTG: Ambulation distance in home environment: at least 50 ft using LRAD   Problem: RH Wheelchair Mobility Goal: LTG Patient will propel w/c in controlled environment (PT) Description: LTG: Patient will propel wheelchair in controlled environment, # of feet with assist (PT) Flowsheets (Taken 07/26/2022 1817) LTG: Pt will propel w/c in controlled environ  assist needed:: Independent with assistive device LTG: Propel w/c distance in controlled environment: 150 feet   Problem: RH Stairs Goal: LTG Patient will ambulate up and down stairs w/assist (PT) Description: LTG: Patient will ambulate up and down # of stairs with assistance (PT) Flowsheets (Taken 07/26/2022 1817) LTG: Pt will ambulate up/down stairs assist needed:: Contact Guard/Touching assist LTG: Pt will  ambulate up and down number of stairs: at least 2 platform steps using LRAD

## 2022-07-28 NOTE — Plan of Care (Signed)
Problem: RH Balance Goal: LTG: Patient will maintain dynamic sitting balance (OT) Description: LTG:  Patient will maintain dynamic sitting balance with assistance during activities of daily living (OT) Flowsheets (Taken 07/28/2022 1149) LTG: Pt will maintain dynamic sitting balance during ADLs with: Independent Goal: LTG Patient will maintain dynamic standing with ADLs (OT) Description: LTG:  Patient will maintain dynamic standing balance with assist during activities of daily living (OT)  Flowsheets (Taken 07/28/2022 1149) LTG: Pt will maintain dynamic standing balance during ADLs with: Independent with assistive device   Problem: Sit to Stand Goal: LTG:  Patient will perform sit to stand in prep for activites of daily living with assistance level (OT) Description: LTG:  Patient will perform sit to stand in prep for activites of daily living with assistance level (OT) Flowsheets (Taken 07/28/2022 1149) LTG: PT will perform sit to stand in prep for activites of daily living with assistance level: Independent with assistive device   Problem: RH Grooming Goal: LTG Patient will perform grooming w/assist,cues/equip (OT) Description: LTG: Patient will perform grooming with assist, with/without cues using equipment (OT) Flowsheets (Taken 07/28/2022 1149) LTG: Pt will perform grooming with assistance level of: Independent with assistive device    Problem: RH Bathing Goal: LTG Patient will bathe all body parts with assist levels (OT) Description: LTG: Patient will bathe all body parts with assist levels (OT) Flowsheets (Taken 07/28/2022 1149) LTG: Pt will perform bathing with assistance level/cueing: Independent with assistive device  LTG: Position pt will perform bathing: At sink   Problem: RH Dressing Goal: LTG Patient will perform upper body dressing (OT) Description: LTG Patient will perform upper body dressing with assist, with/without cues (OT). Flowsheets (Taken 07/28/2022 1149) LTG: Pt  will perform upper body dressing with assistance level of: Independent with assistive device Goal: LTG Patient will perform lower body dressing w/assist (OT) Description: LTG: Patient will perform lower body dressing with assist, with/without cues in positioning using equipment (OT) Flowsheets (Taken 07/28/2022 1149) LTG: Pt will perform lower body dressing with assistance level of: Independent with assistive device   Problem: RH Toileting Goal: LTG Patient will perform toileting task (3/3 steps) with assistance level (OT) Description: LTG: Patient will perform toileting task (3/3 steps) with assistance level (OT)  Flowsheets (Taken 07/28/2022 1149) LTG: Pt will perform toileting task (3/3 steps) with assistance level: Independent with assistive device   Problem: RH Simple Meal Prep Goal: LTG Patient will perform simple meal prep w/assist (OT) Description: LTG: Patient will perform simple meal prep with assistance, with/without cues (OT). Flowsheets (Taken 07/28/2022 1149) LTG: Pt will perform simple meal prep with assistance level of: Independent with assistive device LTG: Pt will perform simple meal prep w/level of: Wheelchair level   Problem: RH Light Housekeeping Goal: LTG Patient will perform light housekeeping w/assist (OT) Description: LTG: Patient will perform light housekeeping with assistance, with/without cues (OT). Flowsheets (Taken 07/28/2022 1149) LTG: Pt will perform light housekeeping with assistance level of: Independent with assistive device LTG: Pt will perform light housekeeping w/level of: Wheelchair level   Problem: RH Toilet Transfers Goal: LTG Patient will perform toilet transfers w/assist (OT) Description: LTG: Patient will perform toilet transfers with assist, with/without cues using equipment (OT) Flowsheets (Taken 07/28/2022 1149) LTG: Pt will perform toilet transfers with assistance level of: Independent with assistive device   Problem: RH Tub/Shower  Transfers Goal: LTG Patient will perform tub/shower transfers w/assist (OT) Description: LTG: Patient will perform tub/shower transfers with assist, with/without cues using equipment (OT) Flowsheets (Taken 07/28/2022 1149) LTG:  Pt will perform tub/shower stall transfers with assistance level of: Independent with assistive device LTG: Pt will perform tub/shower transfers from: Tub/shower combination   Problem: RH Furniture Transfers Goal: LTG Patient will perform furniture transfers w/assist (OT/PT) Description: LTG: Patient will perform furniture transfers  with assistance (OT/PT). Flowsheets (Taken 07/28/2022 1150) LTG: Pt will perform furniture transfers with assist:: Independent with assistive device

## 2022-07-28 NOTE — IPOC Note (Signed)
Overall Plan of Care [facility]) Patient Details Name: Robert Burnett MRN: [id number] DOB: [birth year]  Admitting Diagnosis: Osteomyelitis of left lower extremity [facility])  Hospital Problems: Principal Problem:   Osteomyelitis of left lower extremity ([place])     Functional Problem List: Nursing Pain, Safety, Skin Integrity  PT Balance, Edema, Endurance, Motor, Nutrition, Pain, Safety, Sensory, Skin Integrity  OT Balance, Endurance, Motor, Pain  SLP    TR         Basic ADL's: OT Grooming, Bathing, Dressing, Toileting     Advanced  ADL's: OT Simple Meal Preparation     Transfers: PT Bed Mobility, Bed to Chair, Car, Patent attorney, Agricultural engineer: PT Ambulation, Emergency planning/management officer, Stairs     Additional Impairments: OT Fuctional Use of Upper Extremity  SLP        TR      Anticipated Outcomes Item Anticipated Outcome  Self Feeding    Swallowing      Basic self-care  Mod I  Toileting  Mod I   Bathroom Transfers Mod I  Bowel/Bladder  remain continent x2  Transfers  Mod I  Locomotion  supervision  Communication     Cognition     Pain  maintain pain below a 4 on a 0-10 scale  Safety/Judgment  remain freee of injury   Therapy Plan: PT Intensity: Minimum of 1-2 x/day ,45 to 90 minutes PT Frequency: 5 out of 7 days PT Duration Estimated Length of Stay: 7-10 days OT Intensity: Minimum of 1-2 x/day, 45 to 90 minutes OT Frequency: 5 out of 7 days OT Duration/Estimated Length of Stay: 2 weeks     Team Interventions: Nursing Interventions Patient/Family Education, Skin Care/Wound Management, Pain Management, Psychosocial Support, Medication Management  PT interventions Ambulation/gait training, Community reintegration, DME/adaptive equipment instruction, Neuromuscular re-education, Psychosocial support, Stair training, UE/LE Strength taining/ROM, Wheelchair propulsion/positioning, Training and development officer, Discharge planning, Functional  electrical stimulation, Pain management, Therapeutic Activities, Skin care/wound management, UE/LE Coordination activities, Cognitive remediation/compensation, Disease management/prevention, Functional mobility training, Patient/family education, Splinting/orthotics, Therapeutic Exercise, Visual/perceptual remediation/compensation  OT Interventions Balance/vestibular training, Neuromuscular re-education, Self Care/advanced ADL retraining, Therapeutic Exercise, UE/LE Strength taining/ROM, Pain management, DME/adaptive equipment instruction, Patient/family education, UE/LE Coordination activities, Community reintegration, Discharge planning, Functional mobility training, Psychosocial support, Therapeutic Activities  SLP Interventions    TR Interventions    SW/CM Interventions Discharge Planning, Psychosocial Support, Patient/Family Education   Barriers to Discharge MD  Medical stability  Nursing Weight bearing restrictions, Wound Care    PT Inaccessible home environment, Decreased caregiver support, Home environment access/layout, Wound Care, Lack of/limited family support, Insurance for SNF coverage, Weight bearing restrictions    OT Weight bearing restrictions, Lack of/limited family support, Decreased caregiver support    SLP      SW       Team Discharge Planning: Destination: PT-Home ,OT- Home , SLP-  Projected Follow-up: PT-None, 24 hour supervision/assistance, OT-  Home health OT, SLP-  Projected Equipment Needs: PT-To be determined, OT- To be determined, SLP-  Equipment Details: PT- , OT-has tub transfer bench Patient/family involved in discharge planning: PT- Patient,  OT-Patient, SLP-   MD ELOS: 2 weeks Medical Rehab Prognosis:  Excellent Assessment: 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 The team will be addressing functional mobility, strength, stamina, balance, safety, adaptive techniques and equipment, self-care, bowel and bladder mgt,  patient and caregiver education.  Goals have been set at modI/S. Anticipated discharge destination is home.  See Team Conference Notes for weekly updates to the plan of care

## 2022-07-29 DIAGNOSIS — M869 Osteomyelitis, unspecified: Secondary | ICD-10-CM | POA: Diagnosis not present

## 2022-07-29 LAB — GLUCOSE, CAPILLARY
Glucose-Capillary: 143 mg/dL — ABNORMAL HIGH (ref 70–99)
Glucose-Capillary: 151 mg/dL — ABNORMAL HIGH (ref 70–99)
Glucose-Capillary: 153 mg/dL — ABNORMAL HIGH (ref 70–99)
Glucose-Capillary: 125 mg/dL — ABNORMAL HIGH (ref 70–99)

## 2022-07-29 MED ORDER — OXYCODONE HCL 5 MG PO TABS
10.0000 mg | ORAL_TABLET | Freq: Once | ORAL | Status: AC
  Administered 2022-07-29: 10 mg via ORAL
  Filled 2022-07-29: qty 2

## 2022-07-29 MED ORDER — POLYETHYLENE GLYCOL 3350 17 G PO PACK
17.0000 g | PACK | Freq: Every day | ORAL | Status: DC
  Administered 2022-07-29 – 2022-08-07 (×6): 17 g via ORAL
  Filled 2022-07-29 (×10): qty 1

## 2022-07-29 MED ORDER — DULOXETINE HCL 20 MG PO CPEP
20.0000 mg | ORAL_CAPSULE | Freq: Every day | ORAL | Status: DC
  Administered 2022-07-29 – 2022-07-31 (×3): 20 mg via ORAL
  Filled 2022-07-29 (×3): qty 1

## 2022-07-29 MED ORDER — SENNA 8.6 MG PO TABS
2.0000 | ORAL_TABLET | Freq: Every day | ORAL | Status: DC
  Administered 2022-07-29 – 2022-08-05 (×8): 17.2 mg via ORAL
  Filled 2022-07-29 (×9): qty 2

## 2022-07-29 MED ORDER — MAGNESIUM HYDROXIDE 400 MG/5ML PO SUSP
15.0000 mL | Freq: Every day | ORAL | Status: DC
  Administered 2022-07-29: 15 mL via ORAL
  Filled 2022-07-29: qty 30

## 2022-07-29 NOTE — Progress Notes (Signed)
Physical Therapy Session Note  Patient Details  Name: Robert Burnett MRN: [id number] Date of Birth: Dec 04, 1960  Today's Date: 07/29/2022 PT Individual Time: 0915-1002 and 1300-1355 PT Individual Time Calculation (min): 47 min  and 55 min  Today's Date: 07/29/2022 PT Missed Time: 13 Minutes Missed Time Reason: Pain  Short Term Goals: Week 1:  PT Short Term Goal 1 (Week 1): STG = LTG d/t ELOS  Skilled Therapeutic Interventions/Progress Updates:   Treatment Session 1 Received pt sitting in recliner reporting having a "bad bad day" due to high pain levels. Pt agreeable to PT treatment and reported pain 10/10 in L residual limb (premedicated) - secure chatted MD regarding pain. Session with emphasis on functional mobility/transfers, generalized strengthening and endurance, and pain management. Pt performed all transfers with RW and CGA throughout session. Pt required increased time and assist to try to reposition in [facility], reporting intense pain in L posterior hip and groin. Pt requesting therapist "roll him around" unit stating he was in too much pain to propel himself - transported to/from room in [facility] dependently. Pt constantly groaning and fidgeting in WC due to pain. Encouraged desensitization, rubbing, and massage however pt reported no relief. Pt then performed RLE strengthening on Kinetron for 2 minutes x 1 and 1 minute x 1 at 20 cm/sec with therapist providing manual counter resistance with emphasis on glute/quad strength and as a distractor from pain - although did not help. Provided pt with ice pack to place on L hip for pain - also did not help. Returned to room and pt ambulated 40f with RW and CGA to bed and transferred sit<>supine with supervision. Performed PROM into L hip flexion/abduction/IR/ER per pt request, then performed hamstring stretch - pt reported minor relief. Attempted bed level exercises, however pt only able to perform x5 reps of SLR prior to stopping due to increased pain.  Concluded session with pt sidelying in bed, needs within reach, and bed alarm on. Placed pillow in between knees for comfort. 13 minutes missed of skilled physical therapy due to pain.    Treatment Session 2 Received pt semi-reclined in bed, pt agreeable to PT treatment, and reported pain 8/10 in L residual limb (premedicated). Session with emphasis on functional mobility/transfers, generalized strengthening and endurance, dynamic standing balance, and gait training. Pt transferred semi-reclined<>sitting EOB with HOB elevated and supervision. Donned L limb guard with mod A and R shoe with max A for time management purposes. Pt performed all transfers with RW and CGA throughout session. [facility] Pt ambulated 242fwith RW and CGA with close WC follow - limited by increased pain. Pt transferred sit<>supine and worked on the following exercises with bolster under R knee due to low back pain with emphasis on LE strength/ROM: -LLE SLR 2x8 -LLE hip abduction 1x8 -RLE SAQ with 2.5lb ankle weight 2x12 Pt was unable to tolerate any further exercises/activites and requested to return to room. Pt transferred supine<>sitting EOM with supervision and returned to room. Pt transferred into recliner and spent increased time getting comfortable in recliner with pillows - caution to keep L residual limb in extension. Pt with questions regarding getting wedge to go underneeth knees to support low back - emphasized to pt that if he was planning on using wedge, then  LLE needed to be resting in extension on wedge with limb guard on or resting in extension on bed/mat with RLE on wedge to prevent contracture formation - showed pt  images of various wedges on [place]. Concluded session with pt sitting in recliner with all needs within reach. Provided pt with ice pack for L hip and fresh drink.   Therapy Documentation Precautions:  Precautions Precautions:  Fall Precaution Comments: wound vac; watch HR, constant spinal and sternal OA pain Restrictions Weight Bearing Restrictions: Yes LLE Weight Bearing: Non weight bearing Other Position/Activity Restrictions: limb guard when OOB - off when in bed  Therapy/Group: Individual Therapy Alfonse Alpers PT, DPT  07/29/2022, 7:00 AM

## 2022-07-29 NOTE — Progress Notes (Signed)
Occupational Therapy Session Note  Patient Details  Name: Robert Burnett MRN: [id number] Date of Birth: [birth year]  Today's Date: 07/29/2022 OT Individual Time: 1435-1530 OT Individual Time Calculation (min): 55 min    Short Term Goals: Week 1:  OT Short Term Goal 1 (Week 1): Pt will be CGA for LB dressing OT Short Term Goal 2 (Week 1): Pt will be SBA for functional transfers OT Short Term Goal 3 (Week 1): Pt will demonstrate imporved pain managmeent with techniques utilized OT Short Term Goal 4 (Week 1): Pt will completes UB ADLs with Mod I  Skilled Therapeutic Interventions/Progress Updates:  Skilled OT intervention completed with focus on mirror therapy to address phantom pain as well as trigger point and myofascial release for pain reduction. Pt received seated in recliner stating "today has just been an awful day." Nursing in room to administer meds, with pt stating pain has been steadily increasing as the day progresses, however no number given. Described current pain as pain in the "part that's not there anymore" and "L hip/back of knee".  Pt was agreeable to transition out of room to mat for attempt at mirror therapy. Completed CGA squat pivot from recliner > w/c, dependent transport to ortho gym > squat pivot with supervision > EOM. Able to position self into long sitting with supervision. With use of amputee mirror, pt participated in desensitization techniques including rubbing, tapping and visualization to reduce phantom pains. Education provided about it's clinical purpose, and techniques for aiding in pain reduction. Pt verbalized that this technique helped some, but biggest complaint was his hip and back of knee at the time, but would like to try mirror therapy again when the stabbing pain is present in the "LLE."  Transitioned back to EOM, then supervised squat pivot to w/c > dependent transfer back to room > supervised squat pivot to EOB. Therapist offered to try myofascial  release of the "tight/pulling" regions to assess fascial restrictions that might be causing further pain with pt willing to try. Transitioned to R side lying with mod I.   Therapist completed fascial release with mod-max pressure to the left IT band, hamstring, adductors and quadriceps. Mod restrictions found along IT band and hamstring/back of knee regions, however noted several trigger points along IT band as well. Education provided on light stretching that could be used post-massage to further help pain in detected regions. Pt reported that the myofascial release helped lower the pain, and requested to lay in bed to rest now that in less pain. Pt remained in supine, with bed alarm on and all needs in reach at end of session.  Therapy Documentation Precautions:  Precautions Precautions: Fall Precaution Comments: wound vac; watch HR, constant spinal and sternal OA pain Restrictions Weight Bearing Restrictions: Yes LLE Weight Bearing: Non weight bearing Other Position/Activity Restrictions: limb guard when OOB - off when in bed   Therapy/Group: Individual Therapy  [name], MS, OTR/L  07/29/2022, 4:03 PM

## 2022-07-29 NOTE — Progress Notes (Signed)
PROGRESS NOTE   Subjective/Complaints: In severe pain this morning. Additional one time oxycodone ordered and muscle relaxer given. Cymbalta ordered. Pain journal provided Last BM 10/6- daily milk of mag ordered  ROS: +residual limb and phantom limb pain, +constipation   Review of Systems  Constitutional:  Negative for chills and fever.  Respiratory:  Negative for shortness of breath.   Cardiovascular:  Negative for chest pain.       Chest wall soreness  Gastrointestinal:  Negative for abdominal pain, diarrhea, nausea and vomiting.  Musculoskeletal:  Positive for back pain and joint pain.     Objective:   No results found. Recent Labs    07/28/22 0610  WBC 7.9  HGB 8.8*  HCT 26.8*  PLT 218    Recent Labs    07/28/22 0610  NA 133*  K 4.6  CL 98  CO2 27  GLUCOSE 115*  BUN 20  CREATININE 1.23  CALCIUM 9.1     Intake/Output Summary (Last 24 hours) at 07/29/2022 0953 Last data filed at 07/29/2022 0807 Gross per 24 hour  Intake 710 ml  Output 1650 ml  Net -940 ml         Physical Exam: Vital Signs Blood pressure (!) 113/58, pulse (!) 54, temperature 98.3 F (36.8 C), temperature source Oral, resp. rate 14, height '6\' 1"'$  (1.854 m), weight 80.9 kg, SpO2 98 %.    General: Alert and oriented x 3, No apparent distress HEENT: Head is normocephalic, atraumatic, PERRLA, EOMI, sclera anicteric, oral mucosa pink and moist Neck: Supple without JVD or lymphadenopathy Heart: Bradycardia Chest: CTA bilaterally without wheezes, rales, or rhonchi; no distress Chest wall and sternal tenderness Abdomen: Soft, non-tender, non-distended, bowel sounds positive. Extremities: No clubbing, cyanosis, or edema. Pulses are 2+ Psych: Pt's affect is appropriate. Pt is cooperative Skin: warm and dry, wound vac in place Musculoskeletal:     Cervical back: Neck supple.     Comments: Active pressure dressing in place left  residual limb with good seal.      LLE - 3+/5 hip flexion, limited by pain     RLE, BL UE - 5/5 throughout     + R foot resting plantarflexion tone Neurological:     Sensation: Decreased to light touch over right foot, ankle    Mental Status: He is alert and oriented to person, place, and time.    Assessment/Plan: 1. Functional deficits which require 3+ hours per day of interdisciplinary therapy in a comprehensive inpatient rehab setting. Physiatrist is providing close team supervision and 24 hour management of active medical problems listed below. Physiatrist and rehab team continue to assess barriers to discharge/monitor patient progress toward functional and medical goals  Care Tool:  Bathing    Body parts bathed by patient: Right arm, Left arm, Chest, Abdomen, Front perineal area, Buttocks, Right upper leg, Left upper leg, Right lower leg, Face     Body parts n/a: Left lower leg   Bathing assist Assist Level: Contact Guard/Touching assist     Upper Body Dressing/Undressing Upper body dressing   What is the patient wearing?: Pull over shirt    Upper body assist Assist Level: Supervision/Verbal cueing  Lower Body Dressing/Undressing Lower body dressing      What is the patient wearing?: Underwear/pull up     Lower body assist Assist for lower body dressing: Minimal Assistance - Patient > 75% (Manage wound vac)     Toileting Toileting    Toileting assist Assist for toileting: Set up assist Assistive Device Comment: urinal   Transfers Chair/bed transfer  Transfers assist     Chair/bed transfer assist level: Contact Guard/Touching assist     Locomotion Ambulation   Ambulation assist      Assist level: Contact Guard/Touching assist Assistive device: Walker-rolling Max distance: 31f   Walk 10 feet activity   Assist     Assist level: Contact Guard/Touching assist Assistive device: Walker-rolling   Walk 50 feet activity   Assist Walk 50  feet with 2 turns activity did not occur: Safety/medical concerns         Walk 150 feet activity   Assist Walk 150 feet activity did not occur: Safety/medical concerns         Walk 10 feet on uneven surface  activity   Assist Walk 10 feet on uneven surfaces activity did not occur: Safety/medical concerns         Wheelchair     Assist Is the patient using a wheelchair?: Yes Type of Wheelchair: Manual    Wheelchair assist level: Supervision/Verbal cueing Max wheelchair distance: 1536f   Wheelchair 50 feet with 2 turns activity    Assist    Wheelchair 50 feet with 2 turns activity did not occur: Safety/medical concerns   Assist Level: Supervision/Verbal cueing   Wheelchair 150 feet activity     Assist  Wheelchair 150 feet activity did not occur: Safety/medical concerns   Assist Level: Supervision/Verbal cueing   Blood pressure (!) 113/58, pulse (!) 54, temperature 98.3 F (36.8 C), temperature source Oral, resp. rate 14, height '6\' 1"'$  (1.854 m), weight 80.9 kg, SpO2 98 %.  Medical Problem List and Plan: 1. Functional deficits secondary to left below the knee amputation, osteoarthritis with chronic pain.             -patient may not shower             -ELOS/Goals: 7-10 days  -Continue CIR  -PRAFO has been ordered on right 2.  Antithrombotics: -DVT/anticoagulation:  Pharmaceutical: Eliquis             -antiplatelet therapy: Plavix 3. Pain Management (acute post-op, chronic chest/back): Fentanyl patch.  Oxycodone, Robaxin, Tylenol as needed  Cymbalta '20mg'$  daily started             - Continue Fentanyl patch 75 mcg/hr OP medication, per PDMP last filled 06/06/22 by Dr. FaFlorencia [facility] (15 days supply)                       - Restarted inpatient 10/6                       - Of note, documented insurance denial for further patches 8/21 d/t no pain contract  Increase gabapentin to '200mg'$  TID   4. Mood/Behavior/Sleep: LCSW to evaluate and  provide emotional support             -antipsychotic agents: None   5. Neuropsych/cognition: This patient is capable of making decisions on his own behalf. 6. Skin/Wound Care: Routine skin care checks             -  VAC dressing can come down 10/11   7. Fluids/Electrolytes/Nutrition: Routine I's and O's             -continue carb modified diet             -continue vitamin C, zinc, Juven supplements 8: CAD/ Paroxysmal A fib: left heart cath 05/2022; stents to LAD             -Restarted on Eliquis 10/5             -continue Toprol XL 25 mg TID             - Intermittent tachycardia likely d/t poor pain control    9: T2DM c/b peripheral neuropathy: CBGs q AC and q HS             -continue Levemir 8 units daily             -continue Farxiga 10 mg daily  -placed nursing order to not provide any juice or soda             -SSI CBG (last 3)  Recent Labs    07/28/22 1654 07/28/22 2127 07/29/22 0616  GLUCAP 135* 176* 143*     10: Hyperlipidemia: continue Zetia, Lipitor 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 Is/Os negative; follow-up BMP  -10/7 Improved to 132 ,   recheck tomorrow 15. Right foot plantarflexion tone: Order PRAFO on IPR admission.  16. Low albumin  -Continue protein supplement, eating 75-100% meals 17. History of chronic costochondritis/chest wall arthritis  on fentanyl 75  -Lidocaine patch started 18. Screening for vitamin D deficiency: add on vitamin D level today.  19. Suboptimal magnesium level: start magneisum gluconate '250mg'$  HS 20. Constipation: start milk of magnesia 46m daily  LOS: 4 days A FACE TO FACE EVALUATION WAS PERFORMED  KClide DeutscherRaulkar 07/29/2022, 9:53 AM

## 2022-07-30 ENCOUNTER — Other Ambulatory Visit (HOSPITAL_COMMUNITY): Payer: Self-pay

## 2022-07-30 LAB — GLUCOSE, CAPILLARY
Glucose-Capillary: 134 mg/dL — ABNORMAL HIGH (ref 70–99)
Glucose-Capillary: 140 mg/dL — ABNORMAL HIGH (ref 70–99)
Glucose-Capillary: 141 mg/dL — ABNORMAL HIGH (ref 70–99)
Glucose-Capillary: 151 mg/dL — ABNORMAL HIGH (ref 70–99)
Glucose-Capillary: 160 mg/dL — ABNORMAL HIGH (ref 70–99)

## 2022-07-30 MED ORDER — AMITRIPTYLINE HCL 25 MG PO TABS
25.0000 mg | ORAL_TABLET | Freq: Every day | ORAL | Status: DC
  Administered 2022-07-30 – 2022-07-31 (×2): 25 mg via ORAL
  Filled 2022-07-30 (×2): qty 1

## 2022-07-30 NOTE — Progress Notes (Deleted)
Received call from nurse regarding blood sugar 174. She was started on tube feeds this afternoon . This provider was in contact with Dr Naaman Plummer , she will be place on Sensitive SSI and we will continue the blood glucose checks every 4 hours. The above discussed with Naoma Diener, she verbalizes understanding.

## 2022-07-30 NOTE — Progress Notes (Signed)
Wound vacuum removed. Staples in tact. Kerlix applied and ABD applied.

## 2022-07-30 NOTE — Progress Notes (Signed)
Occupational Therapy Session Note  Patient Details  Name: Robert Burnett MRN: [id number] Date of Birth: [birth year]  Today's Date: 07/30/2022 OT Individual Time: 0156-1537 & 1450-1315 OT Individual Time Calculation (min): 55 min & 25 min   Short Term Goals: Week 1:  OT Short Term Goal 1 (Week 1): Pt will be CGA for LB dressing OT Short Term Goal 2 (Week 1): Pt will be SBA for functional transfers OT Short Term Goal 3 (Week 1): Pt will demonstrate imporved pain managmeent with techniques utilized OT Short Term Goal 4 (Week 1): Pt will completes UB ADLs with Mod I  Skilled Therapeutic Interventions/Progress Updates:  Session 1 Skilled OT intervention completed with focus on tub transfer and DME education. Pt received seated in recliner, un-rated pain in L hip, nurse in room to administer meds. Therapist offered rest breaks and repositioning for pain reduction throughout session.   Donned limb guard on L residual limb with set up A. Sit > stand with min A using RW, then CGA stand pivot to w/c. Pt self-propelled in w/c <> ADL bathroom with supervision.   Education provided on DME available such as tub bench that can be purchased for use at home, suggested use of [facility] for ease of bathing, shower curtain management to prevent water spillage, minimizing stands via lateral leans for pericare, use of grab bars/installation as well as effective safety strategies for exiting the shower to eliminate falls. Pt was able to return demonstrate ambulatory transfer into bathroom with CGA using RW, then sit pivot with supervision on/off tub bench. Mod A sit > stand using RW from tub bench due to pain/fatigue, then CGA stand pivot to w/c. Cues needed often for positioning and safety.  Back in room, therapist provided handout for [facility] and wall mount that can be purchased for safety/independence with showering at home. Completed CGA sit > stand and stand pivot to recliner using RW, then remained reclined back,  with pillows under L BKA for comfort, fresh ice per request and all needs met at end of session.  Session 2 Skilled OT intervention completed with focus on BUE strengthening to maintain integrity of bilateral shoulders in prep for use of RW during functional transfers. Pt received reclined in recliner, indicating unrated pain in L hip- nurse aware and pt already pre-medicated, with increased dose in prep for wound vac removal.  Of note- pt was very lethargic and loopy during session, making non-coherent statements, had difficulty maintaining thought processes, and required repeated cues for exercises which is different from pt's typical behavior. Nursing made aware, with nurse indicating pt has had high level of opioids today, and are potential causes.   With encouragement, pt was agreeable to seated exercises, including the following: (With yellow theraband) 10 reps Self-anchored bicep flexion each arm Shoulder external rotation Therapist anchored scapular retraction each arm Elbow extension each arm -cues needed for form and technique.  Pt remained seated in recliner, with fresh ice per request and all needs in reach at end of session.   Therapy Documentation Precautions:  Precautions Precautions: Fall Precaution Comments: wound vac; watch HR, constant spinal and sternal OA pain Restrictions Weight Bearing Restrictions: Yes LLE Weight Bearing: Non weight bearing Other Position/Activity Restrictions: limb guard when OOB - off when in bed    Therapy/Group: Individual Therapy  [name], MS, OTR/L  07/30/2022, 2:51 PM

## 2022-07-30 NOTE — Patient Care Conference (Signed)
Inpatient RehabilitationTeam Conference and Plan of Care Update Date: 07/30/2022   Time: 11:39 AM    Patient Name: Robert Burnett      Medical Record Number: [id number]  Date of Birth: 08/08/1961 Sex: Male         Room/Bed: 4M02C/4M02C-01 Payor Info: Payor: [employer] / Plan: [employer] [place] [employer] / Product Type: *No Product type* /    Admit Date/Time:  07/25/2022  5:01 PM  Primary Diagnosis:  Osteomyelitis of left lower extremity [facility])  Hospital Problems: Principal Problem:   Osteomyelitis of left lower extremity [facility])    Expected Discharge Date: Expected Discharge Date: 08/07/22  Team Members Present: Physician leading conference: Dr. Leeroy Cha Social Worker Present: Erlene Quan, BSW Nurse Present: Dorien Chihuahua, RN PT Present: Becky Sax, PT OT Present: Jennefer Bravo, OT SLP Present: Helaine Chess, SLP PPS Coordinator present : Gunnar Fusi, SLP     Current Status/Progress Goal Weekly Team Focus  Bowel/Bladder   continent x2  remain continent  assist with toileting prn   Swallow/Nutrition/ Hydration             ADL's   min A LB bathing/dressing, set up A UB bathing/dressing, min A toileting, CGA toilet transfers  mod I  pain management, residual limb education, mirror therapy, general strengthening, ADL retraining   Mobility   bed mobility CGA, transfers with RW CGA, gait 22f with RW and CGA, WC mobility [id number]  supervision overall, CGA steps, and mod I WC mobility  pain management, functional mobility/transfers, generalized strengthening and endurance, amputee education, dyanamic standing balance/coordination, gait training, D/C planning, equipment management.   Communication             Safety/Cognition/ Behavioral Observations            Pain   complains of pain 8/10 left side of hip/phantom pain  reduce pain to 3/10  assess pain q shift prn   Skin   wound vac in place; left BKA; remainder skin intact  promote healing and prevention of  infection  assess skin q shift prn     Discharge Planning:  Goals for MOD I. Patient will discharging home with PRN/intermittent assistance   Team Discussion: Patient limited by pain post amputation with chronic pain PTA; MD adjusted medications and added amitriptyline at HS.  Patient on target to meet rehab goals: Yes, currently needs supervision for bed moblity. Transfers stand pivot with CGA and able to ambuate 2734with CGA. Supervision for w/c mobility and can hop up a step with CGA.  Needs cues for set up to complete upper body care, min assist for toileting and CGA for toilet transfers.  Needs min assist for lower body bathing and dressing.  *See Care Plan and progress notes for long and short-term goals.   Revisions to Treatment Plan:  Downgraded OT goals to supervision  Teaching Needs: Safety, wound/incision care, medications, secondary risk management, transfers, toileting, etc  Current Barriers to Discharge: Decreased caregiver support, Home enviroment access/layout, and Weight bearing restrictions  Possible Resolutions to Barriers: Family education HH follow up services DME: W/C,     Medical Summary Current Status: type 2 diabetes with hyperglycemia, residual limb and phantom limb pain, constipation, insomnia  Barriers to Discharge: Medical stability  Barriers to Discharge Comments: type 2 diabetes with hyperglycemia, residual limb and phantom limb pain, constipation, insomnia Possible Resolutions to [employer]: provide dietary education, continue fentany patch, start amitriptyline '25mg'$  HS, started cymbalta, continue senna/miralax/magnesium, continue gabapentin   Continued  Need for Acute Rehabilitation Level of Care: The patient requires daily medical management by a physician with specialized training in physical medicine and rehabilitation for the following reasons: Direction of a multidisciplinary physical rehabilitation program to maximize functional  independence : Yes Medical management of patient stability for increased activity during participation in an intensive rehabilitation regime.: Yes Analysis of laboratory values and/or radiology reports with any subsequent need for medication adjustment and/or medical intervention. : Yes   I attest that I was present, lead the team conference, and concur with the assessment and plan of the team.   Dorien Chihuahua B 07/30/2022, 1:30 PM

## 2022-07-30 NOTE — Progress Notes (Signed)
Physical Therapy Session Note  Patient Details  Name: Robert Burnett MRN: [id number] Date of Birth: 1961/09/24  Today's Date: 07/30/2022 PT Individual Time: [phone] and 1100-1125 PT Individual Time Calculation (min): 69 min and 25 min  Short Term Goals: Week 1:  PT Short Term Goal 1 (Week 1): STG = LTG d/t ELOS  Skilled Therapeutic Interventions/Progress Updates:   Treatment Session 1 Received pt supine in bed, pt agreeable to PT treatment, and reported pain 4-5/10 in L residual limb (premedicated) - RN arrived to administer medication during session. Pt reported only getting 1-2 hours of sleep last night due to pain - MD made aware. Plan for wound vac removal today after therapy. Session with emphasis on dressing, functional mobility/transfers, generalized strengthening and endurance, dynamic standing balance, and curb navigation. Doffed dirty shirt and donned clean one with supervision and pt transferred long sitting<>sitting EOB with supervision and donned R shoe and limb guard with supervision. Pt transferred bed<>WC stand<>pivot with RW and CGA and performed WC mobility 183f using BUE and supervision to main therapy gym. Discussed equipment for D/C and pt reported already having a RW and a WC that was his father's but requesting his own - notified CSW.  Discussed pt's home entry - reports having 1 3in step<>landing<>and 1 6in step<>landing to enter home. Demonstrated technique for navigating step going up backwards and coming down forwards using RW - pt then practiced navigating 1 3in curb x 2 trials with RW and CGA/light min A with cues for technique. Emphasized that pt's nephew needs to be present when he is doing steps and pt verbalized understanding. Pt plans on asking nephew to take pictures of RW, WC, and steps to confirm as pt reports having occasional memory problems. Pt reported increased pain and "tightness" in hips - recommended prone exercises. Pt ambulated 540fwith RW and CGA to  mat and transferred sit<>prone with supervision. Pt performed prone hamstring curls on RLE 2x10 and prone L hip extensions 2x5 with emphasis on hip flexor stretching - limited by increased low back pain. Pt transferred prone<>sitting EOM with supervision and transferred mat<>WC stand<>pivot with RW and CGA. [facility] and transferred WC<>recliner stand<>pivot with RW and CGA. Concluded session with pt sitting in recliner with all needs within reach eating breakfast.   Treatment Session 2 Received pt sitting in recliner moaning and rubbing residual limb, reporting pain 7/10 in localized area (premedicated). Pt requested to remain in room and work on exercises rather than going to gym due to increased pain. Pt performed the following exercises with emphasis on LE strength/ROM/contracture prevention and pain reduction: -seated knee extensions 2x10 bilaterally -reclined recliner to supine and performed SLR 2x10 bilaterally -reclined hamstring stretch 2x20 second hold on L residual limb -L piriformis stretch 2x20 second hold  During rest breaks pt rubbed residual limb for pain management. Discussed having nephew come in for family education training prior to D/C and pt stated he would call nephew to discuss with him. Concluded session with pt sitting in recliner with all needs within reach.   Therapy Documentation Precautions:  Precautions Precautions: Fall Precaution Comments: wound vac; watch HR, constant spinal and sternal OA pain Restrictions Weight Bearing Restrictions: Yes LLE Weight Bearing: Non weight bearing Other Position/Activity Restrictions: limb guard when OOB - off when in bed  Therapy/Group: Individual Therapy AnAlfonse AlpersT, DPT  07/30/2022, 6:53 AM

## 2022-07-30 NOTE — Progress Notes (Signed)
Patient ID: Robert Burnett, male   DOB: 25-Mar-1961, 61 y.o.   MRN: [id number]  Team Conference Report to Patient/Family  Team Conference discussion was reviewed with the patient and caregiver, including goals, any changes in plan of care and target discharge date.  Patient and caregiver express understanding and are in agreement.  The patient has a target discharge date of 08/07/22.   SW spoke with patient and provided conference updates. Sw informed patient of the DME that would be ordered for discharge. Patient will attempt to call nephew this evening after work to confirm a day for family education. Sw will follow up with the patient tomorrow.  Dyanne Iha 07/30/2022, 1:02 PM

## 2022-07-30 NOTE — Progress Notes (Signed)
PROGRESS NOTE   Subjective/Complaints: Wound vac to be removed today Still with residual limb and phantom pain, but improved today Slept poorly last night  ROS: +residual limb and phantom limb pain, +constipation, +insomnia   Review of Systems  Constitutional:  Negative for chills and fever.  Respiratory:  Negative for shortness of breath.   Cardiovascular:  Negative for chest pain.       Chest wall soreness  Gastrointestinal:  Negative for abdominal pain, diarrhea, nausea and vomiting.  Musculoskeletal:  Positive for back pain and joint pain.     Objective:   No results found. Recent Labs    07/28/22 0610  WBC 7.9  HGB 8.8*  HCT 26.8*  PLT 218    Recent Labs    07/28/22 0610  NA 133*  K 4.6  CL 98  CO2 27  GLUCOSE 115*  BUN 20  CREATININE 1.23  CALCIUM 9.1     Intake/Output Summary (Last 24 hours) at 07/30/2022 1115 Last data filed at 07/30/2022 0331 Gross per 24 hour  Intake 237 ml  Output 1450 ml  Net -1213 ml         Physical Exam: Vital Signs Blood pressure (!) 144/77, pulse 69, temperature 98.2 F (36.8 C), temperature source Oral, resp. rate 18, height '6\' 1"'$  (1.854 m), weight 80.9 kg, SpO2 100 %.    General: Alert and oriented x 3, No apparent distress, BMI 23.53 HEENT: Head is normocephalic, atraumatic, PERRLA, EOMI, sclera anicteric, oral mucosa pink and moist Neck: Supple without JVD or lymphadenopathy Heart: Bradycardia Chest: CTA bilaterally without wheezes, rales, or rhonchi; no distress Chest wall and sternal tenderness Abdomen: Soft, non-tender, non-distended, bowel sounds positive. Extremities: No clubbing, cyanosis, or edema. Pulses are 2+ Psych: Pt's affect is appropriate. Pt is cooperative Skin: warm and dry, wound vac in place Musculoskeletal:     Cervical back: Neck supple.     Comments: Active pressure dressing in place left residual limb with good seal.      LLE  - 3+/5 hip flexion, limited by pain     RLE, BL UE - 5/5 throughout     + R foot resting plantarflexion tone Neurological:     Sensation: Decreased to light touch over right foot, ankle    Mental Status: He is alert and oriented to person, place, and time.    Assessment/Plan: 1. Functional deficits which require 3+ hours per day of interdisciplinary therapy in a comprehensive inpatient rehab setting. Physiatrist is providing close team supervision and 24 hour management of active medical problems listed below. Physiatrist and rehab team continue to assess barriers to discharge/monitor patient progress toward functional and medical goals  Care Tool:  Bathing    Body parts bathed by patient: Right arm, Left arm, Chest, Abdomen, Front perineal area, Buttocks, Right upper leg, Left upper leg, Right lower leg, Face     Body parts n/a: Left lower leg   Bathing assist Assist Level: Contact Guard/Touching assist     Upper Body Dressing/Undressing Upper body dressing   What is the patient wearing?: Pull over shirt    Upper body assist Assist Level: Supervision/Verbal cueing    Lower Body Dressing/Undressing  Lower body dressing      What is the patient wearing?: Underwear/pull up     Lower body assist Assist for lower body dressing: Minimal Assistance - Patient > 75% (Manage wound vac)     Toileting Toileting    Toileting assist Assist for toileting: Set up assist Assistive Device Comment: urinal   Transfers Chair/bed transfer  Transfers assist     Chair/bed transfer assist level: Contact Guard/Touching assist     Locomotion Ambulation   Ambulation assist      Assist level: Contact Guard/Touching assist Assistive device: Walker-rolling Max distance: 36f   Walk 10 feet activity   Assist     Assist level: Contact Guard/Touching assist Assistive device: Walker-rolling   Walk 50 feet activity   Assist Walk 50 feet with 2 turns activity did not  occur: Safety/medical concerns         Walk 150 feet activity   Assist Walk 150 feet activity did not occur: Safety/medical concerns         Walk 10 feet on uneven surface  activity   Assist Walk 10 feet on uneven surfaces activity did not occur: Safety/medical concerns         Wheelchair     Assist Is the patient using a wheelchair?: Yes Type of Wheelchair: Manual    Wheelchair assist level: Supervision/Verbal cueing Max wheelchair distance: 1543f   Wheelchair 50 feet with 2 turns activity    Assist    Wheelchair 50 feet with 2 turns activity did not occur: Safety/medical concerns   Assist Level: Supervision/Verbal cueing   Wheelchair 150 feet activity     Assist  Wheelchair 150 feet activity did not occur: Safety/medical concerns   Assist Level: Supervision/Verbal cueing   Blood pressure (!) 144/77, pulse 69, temperature 98.2 F (36.8 C), temperature source Oral, resp. rate 18, height '6\' 1"'$  (1.854 m), weight 80.9 kg, SpO2 100 %.  Medical Problem List and Plan: 1. Functional deficits secondary to left below the knee amputation, osteoarthritis with chronic pain.             -patient may not shower             -ELOS/Goals: 7-10 days  -Continue CIR  Team conference today  -PRAFO has been ordered on right 2.  Antithrombotics: -DVT/anticoagulation:  Pharmaceutical: Eliquis             -antiplatelet therapy: Plavix 3. Pain Management (acute post-op, chronic chest/back): Fentanyl patch.  Oxycodone, Robaxin, Tylenol as needed  Cymbalta '20mg'$  daily started             - Continue Fentanyl patch 75 mcg/hr OP medication, per PDMP last filled 06/06/22 by Dr. FaFlorencia [facility] (15 days supply)                       - Restarted inpatient 10/6                       - Of note, documented insurance denial for further patches 8/21 d/t no pain contract  Increase gabapentin to '200mg'$  TID   Start amitriptyline '25mg'$  HS 4. Insomnia: start amitriptyline '25mg'$   hS 5. Neuropsych/cognition: This patient is capable of making decisions on his own behalf. 6. Incisional wound: vac may be removed today 7. Fluids/Electrolytes/Nutrition: Routine I's and O's             -continue carb modified diet             -  continue vitamin C, zinc, Juven supplements 8: CAD/ Paroxysmal A fib: left heart cath 05/2022; stents to LAD             -Restarted on Eliquis 10/5             -continue Toprol XL 25 mg TID             - Intermittent tachycardia likely d/t poor pain control    9: T2DM c/b peripheral neuropathy: CBGs q AC and q HS             -continue Levemir 8 units daily             -continue Farxiga 10 mg daily  -placed nursing order to not provide any juice or soda             -SSI CBG (last 3)  Recent Labs    07/29/22 1644 07/29/22 2108 07/30/22 0606  GLUCAP 153* 125* 134*     10: Hyperlipidemia: continue Zetia, Lipitor 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 Is/Os negative; follow-up BMP  -10/7 Improved to 132 ,   recheck tomorrow 15. Right foot plantarflexion tone: Order PRAFO on IPR admission.  16. Low albumin  -Continue protein supplement, eating 75-100% meals 17. History of chronic costochondritis/chest wall arthritis  on fentanyl 75  -Lidocaine patch started 18. Screening for vitamin D deficiency: add on vitamin D level today.  19. Suboptimal magnesium level: start magneisum gluconate '250mg'$  HS 20. Constipation: start milk of magnesia 90m daily  LOS: 5 days A FACE TO FACE EVALUATION WAS PERFORMED  Arther Heisler P Maddalynn Barnard 07/30/2022, 11:15 AM

## 2022-07-30 NOTE — Progress Notes (Addendum)
   07/30/22 2234  What Happened  Was fall witnessed? No  Was patient injured? No  Patient found on floor  Found by Staff-comment  Stated prior activity  (Juddie NA)  Follow Up  MD notified  Zella Ball)  Time MD notified 2322  Family notified  (Pt refused)  Time family notified  (Pt  refused)  Additional tests No  Simple treatment Other (comment)  Progress note created (see row info) Yes  Adult Fall Risk Assessment  Risk Factor Category (scoring not indicated) High fall risk per protocol (document High fall risk)  Age 61  Fall History: Fall within 6 months prior to admission 0  Elimination; Bowel and/or Urine Incontinence 0  Elimination; Bowel and/or Urine Urgency/Frequency 0  Medications: includes PCA/Opiates, Anti-convulsants, Anti-hypertensives, Diuretics, Hypnotics, Laxatives, Sedatives, and Psychotropics 5  Patient Care Equipment 3  Mobility-Assistance 2  Mobility-Gait 2  Mobility-Sensory Deficit 0  Altered awareness of immediate physical environment 0  Impulsiveness 2  Lack of understanding of one's physical/cognitive limitations 4  Total Score 19  Patient Fall Risk Level High fall risk  Adult Fall Risk Interventions  Required Bundle Interventions *See Row Information* High fall risk - low, moderate, and high requirements implemented  Additional Interventions Use of appropriate toileting equipment (bedpan, BSC, etc.)  Screening for Fall Injury Risk (To be completed on HIGH fall risk patients) - Assessing Need for Floor Mats  Risk For Fall Injury- Criteria for Floor Mats Noncompliant with safety precautions  Will Implement Floor Mats Yes  Pain Assessment  Pain Scale 0-10  Pain Score 8  Pain Type Acute pain  Pain Location Leg  Pain Orientation Left  Pain Intervention(s) Medication (See eMAR)  PCA/Epidural/Spinal Assessment  Respiratory Pattern Regular;Unlabored  Neurological  Neuro (WDL) WDL  Level of Consciousness Alert  Orientation Level Oriented X4  Cognition  Appropriate at baseline  Speech Clear  Musculoskeletal  Musculoskeletal (WDL) X  Assistive Device Front wheel walker;Wheelchair  Generalized Weakness Yes  Weight Bearing Restrictions Yes  LLE Weight Bearing NWB  Musculoskeletal Details  RUE Full movement  LUE Full movement  RLE Full movement  RLE Ortho/Supportive Device Other (Comment)  LLE BKA  Integumentary  Integumentary (WDL) X  Skin Color Appropriate for ethnicity  Skin Condition Dry  Skin Integrity Other (Comment)  Erythema/Redness Location Leg  Erythema/Redness Location Orientation Left  Skin Turgor Non-tenting

## 2022-07-31 ENCOUNTER — Inpatient Hospital Stay (HOSPITAL_COMMUNITY): Payer: Commercial Managed Care - HMO

## 2022-07-31 LAB — GLUCOSE, CAPILLARY
Glucose-Capillary: 126 mg/dL — ABNORMAL HIGH (ref 70–99)
Glucose-Capillary: 153 mg/dL — ABNORMAL HIGH (ref 70–99)
Glucose-Capillary: 183 mg/dL — ABNORMAL HIGH (ref 70–99)
Glucose-Capillary: 176 mg/dL — ABNORMAL HIGH (ref 70–99)

## 2022-07-31 LAB — 25-HYDROXY VITAMIN D LCMS D2+D3
25-Hydroxy, Vitamin D-2: <1 ng/mL
25-Hydroxy, Vitamin D-3: 2.7 ng/mL
25-Hydroxy, Vitamin D: 2.9 ng/mL — ABNORMAL LOW

## 2022-07-31 MED ORDER — METHOCARBAMOL 500 MG PO TABS
500.0000 mg | ORAL_TABLET | Freq: Three times a day (TID) | ORAL | Status: DC | PRN
  Administered 2022-07-31 – 2022-08-04 (×8): 500 mg via ORAL
  Filled 2022-07-31 (×8): qty 1

## 2022-07-31 NOTE — Progress Notes (Signed)
Physical Therapy Session Note  Patient Details  Name: Robert Burnett MRN: [id number] Date of Birth: 08/13/1961  Today's Date: 07/31/2022 PT Individual Time: 8841-6606 PT Individual Time Calculation (min): 71 min   Short Term Goals: Week 1:  PT Short Term Goal 1 (Week 1): STG = LTG d/t ELOS  Skilled Therapeutic Interventions/Progress Updates:   Received pt supine in bed asleep, with severe jerking/twitching but easily woken. Per chart review, pt had fall last night. When asked what happened pt stated he decided to get up and walk to the bathroom by himself and thought his L leg was there, went to step on it, and fell on residual limb - however reported not getting hurt. Pt reports he believes muscle relaxer medication is affecting him but unable to elaborate how, but does appear slightly more confused/"out of it" today. Per RN, incision not dehisced just bleeding but has improved since fall - attempted to find PA, Katharine Look, to discuss wrapping limb this morning however not on unit yet - secure chatted MD and PA. Reinforced safety policies and importance of calling for assist as well as importance of wearing limb guard whenever he is OOB.   Pt reported pain 7/10 in L residual limb - RN arrived to administer pain medication and wound vac now removed. Removed gauze and noted moderate drainage from staples. Applied non-adherent pad (to prevent shrinker from sticking) and donned 4XL shrinker with max A - educated pt on purpose of wearing shrinker and healing properties associated with it - asked MD for 3XL shrinker. Attempted to have pt don limb guard in preparation for OOB mobility, however pt reported feeling tired and insisted on remaining in bed but reluctantly agreed to bed level exercises. Pt performed the following exercises with emphasis on LE strength/ROM, hip flexor stretching, and contracture prevention: -SLR 2x10 bilaterally -R sidelying L hip abduction 2x10  -R sidelying L hip extension  2x10 -supine R single leg bridges x8 Of note, pt required increased time due to lethargy and mod/max cues for technique with exercises - to keep L knee extended rather than resting in flexion and to avoid compensating with hip flexors. Concluded session with pt sitting upright in bed eating breakfast, needs within reach, and bed alarm on. Fall mats placed on floor.    Therapy Documentation Precautions:  Precautions Precautions: Fall Precaution Comments: wound vac; watch HR, constant spinal and sternal OA pain Restrictions Weight Bearing Restrictions: Yes LLE Weight Bearing: Non weight bearing Other Position/Activity Restrictions: limb guard when OOB - off when in bed  Therapy/Group: Individual Therapy Alfonse Alpers PT, DPT  07/31/2022, 6:53 AM

## 2022-07-31 NOTE — Progress Notes (Signed)
Physical Therapy Session Note  Patient Details  Name: NIEL PERETTI MRN: [id number] Date of Birth: [birth year]  Today's Date: 07/31/2022 PT Missed Time: 59 Minutes Missed Time Reason: Patient fatigue  Short Term Goals: Week 1:  PT Short Term Goal 1 (Week 1): STG = LTG d/t ELOS  Skilled Therapeutic Interventions/Progress Updates:      Therapy Documentation Precautions:  Precautions Precautions: Fall Precaution Comments: wound vac; watch HR, constant spinal and sternal OA pain Restrictions Weight Bearing Restrictions: Yes LLE Weight Bearing: Non weight bearing Other Position/Activity Restrictions: limb guard when OOB - off when in bed  Pt declined PT due to residual limb pain and missed 30 minutes of skilled PT.    Therapy/Group: Individual Therapy  Verl Dicker Verl Dicker PT, DPT  07/31/2022, 7:50 AM

## 2022-07-31 NOTE — Progress Notes (Signed)
Physical Therapy Session Note  Patient Details  Name: Robert Burnett MRN: [id number] Date of Birth: 1960/11/23  Today's Date: 07/31/2022 PT Individual Time: 1303-1400 PT Individual Time Calculation (min): 57 min   Short Term Goals: Week 1:  PT Short Term Goal 1 (Week 1): STG = LTG d/t ELOS  Skilled Therapeutic Interventions/Progress Updates:   Pt presents  in bed, stating he still feels out of it but a little better than this morning. RN checked dressing and stable at this time. Pt expressed challenges with medications today and feeling "out of it" and "unclear" - had already discussed with MD. Pt agreeable to attempt therapy session this PM out of room to tolerance. Agreed we could limit standing/gait activities due to general fatigue and feeling "wiped out". Performed bed mobility at supervision level. Donned limb guard with some  assist for placement and positioning. CGA throughout session for squat and stand pivot transfers with occasional cues needed for safe positioning. Required assistance for donning/doffing of legrests this session. Mod I for w/c propulsion on unit to/from therapy gym > 200' for functional mobility, strengthening,  and endurance.   CGA for transfer on mat table  as described above. Engaged in supine therex including quad sets,hip flexion,hip abduction,and knee flexion x 10 reps each.   Instructed in use of mirror in longstanding for mirror therapy as an option for pain management. Did not relieve pain much today with attempt but pt also having more incisional pain due to impact from fall vs phantom pain/sensations. Educated and recommended to trial again another date when the leg did not have as much straight incisional pain as well. Pt in agreement and willing as he did find the thought and theory behind concept interesting. Notified primary PT of this as well.  End of session transferred  back to bed in same fashion with CGA and set up with lunch tray in the bed.     Therapy Documentation Precautions:  Precautions Precautions: Fall Precaution Comments: wound vac; watch HR, constant spinal and sternal OA pain Restrictions Weight Bearing Restrictions: Yes LLE Weight Bearing: Non weight bearing Other Position/Activity Restrictions: limb guard when OOB - off when in bed  Pain: 6 to 7/10 pain in L residual limb -  premedicated. Ongoing chronic hip/arthritis pain.     Therapy/Group: Individual Therapy  Canary Brim Ivory Broad, PT, DPT, CBIS  07/31/2022, 3:06 PM

## 2022-07-31 NOTE — Progress Notes (Addendum)
L BKA incision dressing changed 2 times post fall with moderate drainage. Incision has slowed bleeding. Dressing includes dry gauze ABD pads and kerlex. Pt is alert and oriented times 4 when asked orientation questions. However Pt is rambling with random stories and says he is just speaking out loud. Speaking about buying his nephew a new pair of shoes. He states talking out loud is "nothing new" Pt denies any new onset on confusion and continues to deny hitting his head with the fall at 2230. Pt c/o of 9/10 L leg BKA pain. Oxy '10mg'$  given. Bed alarm activated and fall mats are in place. Vitals are WNL. Resp even and unlabored.   4268  Pt noted sleeping. While asleep Pt is jumping and having mild twitching. When asked if he felt the twitching he replied "not that I am aware of." PT continues to talk about fixing a house and random subjects then says he is talking to himself. RN will continue to monitor.

## 2022-07-31 NOTE — Progress Notes (Signed)
Orthopedic Tech Progress Note Patient Details:  Robert Burnett [birth year] [id number] Called in order to [employer] for shrinker Patient ID: KASPAR ALBORNOZ, male   DOB: 14-Sep-1961, 61 y.o.   MRN: [id number]  [name] 07/31/2022, 9:50 AM

## 2022-07-31 NOTE — Progress Notes (Signed)
Occupational Therapy Session Note  Patient Details  Name: Robert Burnett MRN: [id number] Date of Birth: 1961-03-05  Today's Date: 07/31/2022 OT Individual Time: 1000-1030 OT Individual Time Calculation (min): 30 min    Short Term Goals: Week 1:  OT Short Term Goal 1 (Week 1): Pt will be CGA for LB dressing OT Short Term Goal 2 (Week 1): Pt will be SBA for functional transfers OT Short Term Goal 3 (Week 1): Pt will demonstrate imporved pain managmeent with techniques utilized OT Short Term Goal 4 (Week 1): Pt will completes UB ADLs with Mod I Week 2:     Skilled Therapeutic Interventions/Progress Updates:    1:1 Pt in bed when arrived. Self care retraining: pt's residual limb continues to be bleeding on the bed pads. Pt came to EOB and engaged in bathing and dressing at bed side. Pt performed clothing management and periarea bathing with lateral leans. Pt able to complete all tasks with only setup assistance. Pt left resting in bed with residual limb elevated on a pillow.   Therapy Documentation Precautions:  Precautions Precautions: Fall Precaution Comments: wound vac; watch HR, constant spinal and sternal OA pain Restrictions Weight Bearing Restrictions: Yes LLE Weight Bearing: Non weight bearing Other Position/Activity Restrictions: limb guard when OOB - off when in bed  Pain: reports soreness in residual limb     Therapy/Group: Individual Therapy  Willeen Cass [facility] 07/31/2022, 8:07 PM

## 2022-07-31 NOTE — Progress Notes (Signed)
PROGRESS NOTE   Subjective/Complaints: He had a fall last night and fell on residual limb, limb was noted to have increased bleeding, bleeding has since slowed and was recently redressed He notes increased confusion and would like to stop cymbalta.   ROS: +residual limb and phantom limb pain, +constipation, +insomnia, +increased confusion since starting cymbalta   Review of Systems  Constitutional:  Negative for chills and fever.  Respiratory:  Negative for shortness of breath.   Cardiovascular:  Negative for chest pain.       Chest wall soreness  Gastrointestinal:  Negative for abdominal pain, diarrhea, nausea and vomiting.  Musculoskeletal:  Positive for back pain and joint pain.     Objective:   No results found. No results for input(s): "WBC", "HGB", "HCT", "PLT" in the last 72 hours.  No results for input(s): "NA", "K", "CL", "CO2", "GLUCOSE", "BUN", "CREATININE", "CALCIUM" in the last 72 hours.   Intake/Output Summary (Last 24 hours) at 07/31/2022 0929 Last data filed at 07/31/2022 0000 Gross per 24 hour  Intake 876 ml  Output 2125 ml  Net -1249 ml         Physical Exam: Vital Signs Blood pressure 131/78, pulse 91, temperature 98.3 F (36.8 C), temperature source Oral, resp. rate 18, height '6\' 1"'$  (1.854 m), weight 80.9 kg, SpO2 100 %.    General: Alert and oriented x 3, No apparent distress, BMI 23.53 HEENT: Head is normocephalic, atraumatic, PERRLA, EOMI, sclera anicteric, oral mucosa pink and moist Neck: Supple without JVD or lymphadenopathy Heart: Bradycardia Chest: CTA bilaterally without wheezes, rales, or rhonchi; no distress Chest wall and sternal tenderness Abdomen: Soft, non-tender, non-distended, bowel sounds positive. Extremities: No clubbing, cyanosis, or edema. Pulses are 2+ Psych: Pt's affect is appropriate. Pt is cooperative Skin: warm and dry, wound vac in place Musculoskeletal:      Cervical back: Neck supple.     Comments: Active pressure dressing in place left residual limb with good seal.      LLE - 3+/5 hip flexion, limited by pain     RLE, BL UE - 5/5 throughout     + R foot resting plantarflexion tone Neurological:     Sensation: Decreased to light touch over right foot, ankle    Mental Status: He is alert and oriented to person, place, and time.  Mild twitching    Assessment/Plan: 1. Functional deficits which require 3+ hours per day of interdisciplinary therapy in a comprehensive inpatient rehab setting. Physiatrist is providing close team supervision and 24 hour management of active medical problems listed below. Physiatrist and rehab team continue to assess barriers to discharge/monitor patient progress toward functional and medical goals  Care Tool:  Bathing    Body parts bathed by patient: Right arm, Left arm, Chest, Abdomen, Front perineal area, Buttocks, Right upper leg, Left upper leg, Right lower leg, Face     Body parts n/a: Left lower leg   Bathing assist Assist Level: Contact Guard/Touching assist     Upper Body Dressing/Undressing Upper body dressing   What is the patient wearing?: Pull over shirt    Upper body assist Assist Level: Supervision/Verbal cueing    Lower Body Dressing/Undressing  Lower body dressing      What is the patient wearing?: Underwear/pull up     Lower body assist Assist for lower body dressing: Minimal Assistance - Patient > 75% (Manage wound vac)     Toileting Toileting    Toileting assist Assist for toileting: Set up assist Assistive Device Comment: urinal   Transfers Chair/bed transfer  Transfers assist     Chair/bed transfer assist level: Contact Guard/Touching assist     Locomotion Ambulation   Ambulation assist      Assist level: Contact Guard/Touching assist Assistive device: Walker-rolling Max distance: 48f   Walk 10 feet activity   Assist     Assist level: Contact  Guard/Touching assist Assistive device: Walker-rolling   Walk 50 feet activity   Assist Walk 50 feet with 2 turns activity did not occur: Safety/medical concerns         Walk 150 feet activity   Assist Walk 150 feet activity did not occur: Safety/medical concerns         Walk 10 feet on uneven surface  activity   Assist Walk 10 feet on uneven surfaces activity did not occur: Safety/medical concerns         Wheelchair     Assist Is the patient using a wheelchair?: Yes Type of Wheelchair: Manual    Wheelchair assist level: Supervision/Verbal cueing Max wheelchair distance: 1540f   Wheelchair 50 feet with 2 turns activity    Assist    Wheelchair 50 feet with 2 turns activity did not occur: Safety/medical concerns   Assist Level: Supervision/Verbal cueing   Wheelchair 150 feet activity     Assist  Wheelchair 150 feet activity did not occur: Safety/medical concerns   Assist Level: Supervision/Verbal cueing   Blood pressure 131/78, pulse 91, temperature 98.3 F (36.8 C), temperature source Oral, resp. rate 18, height '6\' 1"'$  (1.854 m), weight 80.9 kg, SpO2 100 %.  Medical Problem List and Plan: 1. Functional deficits secondary to left below the knee amputation, osteoarthritis with chronic pain.             -patient may not shower             -ELOS/Goals: 7-10 days  -Continue CIR  Team conference today  -PRAFO has been ordered on right 2.  Antithrombotics: -DVT/anticoagulation:  Pharmaceutical: Eliquis             -antiplatelet therapy: Plavix 3. Pain Management (acute post-op, chronic chest/back): Fentanyl patch.  Oxycodone, Robaxin, Tylenol as needed  Discontinue cymbalta in case contributing to confusion             - Continue Fentanyl patch 75 mcg/hr OP medication, per PDMP last filled 06/06/22 by Dr. FaFlorencia [facility] (15 days supply)                       - Restarted inpatient 10/6                       - Of note, documented  insurance denial for further patches 8/21 d/t no pain contract  Increase gabapentin to '200mg'$  TID   Start amitriptyline '25mg'$  HS 4. Insomnia: start amitriptyline '25mg'$  hS 5. Neuropsych/cognition: This patient is capable of making decisions on his own behalf. 6. Incisional wound: vac removed, size 3X shrinker ordered 7. Fluids/Electrolytes/Nutrition: Routine I's and O's             -continue carb modified diet             -  continue vitamin C, zinc, Juven supplements 8: CAD/ Paroxysmal A fib: left heart cath 05/2022; stents to LAD             -Restarted on Eliquis 10/5             -continue Toprol XL 25 mg TID             - Intermittent tachycardia likely d/t poor pain control    9: T2DM c/b peripheral neuropathy: CBGs q AC and q HS             -continue Levemir 8 units daily             -continue Farxiga 10 mg daily  -placed nursing order to not provide any juice or soda             -SSI CBG (last 3)  Recent Labs    07/30/22 2202 07/30/22 2257 07/31/22 0609  GLUCAP 151* 160* 126*     10: Hyperlipidemia: continue Zetia, Lipitor 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 Is/Os negative; follow-up BMP  -10/7 Improved to 132 ,   recheck tomorrow 15. Right foot plantarflexion tone: Order PRAFO on IPR admission.  16. Low albumin  -Continue protein supplement, eating 75-100% meals 17. History of chronic costochondritis/chest wall arthritis  on fentanyl 75  -Lidocaine patch started 18. Screening for vitamin D deficiency: add on vitamin D level today.  19. Suboptimal magnesium level: start magneisum gluconate '250mg'$  HS 20. Constipation: start milk of magnesia 27m daily 21. Increased confusion as per therapy: will order CT Head since fall was unwitnessed yesterday  LOS: 6 days A FACE TO FACE EVALUATION WAS  PERFORMED  KClide DeutscherRaulkar 07/31/2022, 9:29 AM

## 2022-08-01 ENCOUNTER — Telehealth: Payer: Self-pay

## 2022-08-01 DIAGNOSIS — Z89512 Acquired absence of left leg below knee: Secondary | ICD-10-CM

## 2022-08-01 LAB — GLUCOSE, CAPILLARY
Glucose-Capillary: 112 mg/dL — ABNORMAL HIGH (ref 70–99)
Glucose-Capillary: 143 mg/dL — ABNORMAL HIGH (ref 70–99)
Glucose-Capillary: 143 mg/dL — ABNORMAL HIGH (ref 70–99)
Glucose-Capillary: 144 mg/dL — ABNORMAL HIGH (ref 70–99)

## 2022-08-01 MED ORDER — ORAL CARE MOUTH RINSE: 15.0000 mL | OROMUCOSAL | Status: DC | PRN

## 2022-08-01 MED ORDER — VITAMIN D (ERGOCALCIFEROL) 1.25 MG (50000 UNIT) PO CAPS
50000.0000 [IU] | ORAL_CAPSULE | ORAL | Status: DC
  Administered 2022-08-01: 50000 [IU] via ORAL
  Filled 2022-08-01: qty 1

## 2022-08-01 MED ORDER — DOXYCYCLINE HYCLATE 100 MG PO TABS
100.0000 mg | ORAL_TABLET | Freq: Two times a day (BID) | ORAL | Status: AC
  Administered 2022-08-01 – 2022-08-06 (×11): 100 mg via ORAL
  Filled 2022-08-01 (×11): qty 1

## 2022-08-01 NOTE — Progress Notes (Signed)
Physical Therapy Weekly Progress Note  Patient Details  Name: Robert Burnett MRN: [id number] Date of Birth: 1961/05/29  Beginning of progress report period: July 26, 2022 End of progress report period: August 01, 2022  Patient has met 3 of 11 long term goals. Pt's LOS was extended as pt will need to reach mod I level as his nephew is only available in the evenings. Pt is currently limited by high pain levels, generalized weakness/deconditioning, and decreased balance strategies. Pt is currently mod I for bed mobility, CGA for stand/squat<>pivot transfers, CGA to ambulate 81f with RW, and min A to navigate curb with RW, and mod I for WC mobility up to 1566f Pt sustained fall on 10/12 when attempting to get up on his own and walk to the bathroom. Pt reported forgetting that his L leg was not there, went to step on it, and fell on his residual limb. Pt reported not being injured but increase in pain as a result of the fall, as well as altering medications to help resolve pt's current confusion has set pt back. Pt will need family education training with nephew prior to D/C as well as continued education on limb loss.   Patient continues to demonstrate the following deficits muscle weakness and muscle joint tightness, decreased cardiorespiratoy endurance, decreased awareness, decreased problem solving, and decreased safety awareness, and decreased standing balance, decreased postural control, decreased balance strategies, and difficulty maintaining precautions and therefore will continue to benefit from skilled PT intervention to increase functional independence with mobility.  Patient progressing toward long term goals..  Continue plan of care.  PT Short Term Goals Week 1:  PT Short Term Goal 1 (Week 1): STG = LTG d/t ELOS Week 2:  PT Short Term Goal 1 (Week 2): STG=LTG due to extended LOS  Skilled Therapeutic Interventions/Progress Updates:  Ambulation/gait training;Community  reintegration;DME/adaptive equipment instruction;Neuromuscular re-education;Psychosocial support;Stair training;UE/LE Strength taining/ROM;Wheelchair propulsion/positioning;Balance/vestibular training;Discharge planning;Functional electrical stimulation;Pain management;Therapeutic Activities;Skin care/wound management;UE/LE Coordination activities;Cognitive remediation/compensation;Disease management/prevention;Functional mobility training;Patient/family education;Splinting/orthotics;Therapeutic Exercise;Visual/perceptual remediation/compensation   Therapy Documentation Precautions:  Precautions Precautions: Fall Precaution Comments: wound vac; watch HR, constant spinal and sternal OA pain Restrictions Weight Bearing Restrictions: Yes LLE Weight Bearing: Non weight bearing Other Position/Activity Restrictions: limb guard when OOB - off when in bed  Therapy/Group: Individual Therapy AnAlfonse AlpersT, DPT  08/01/2022, 6:58 AM

## 2022-08-01 NOTE — Progress Notes (Addendum)
Patient fell day before yesterday and staff report persistent oozing from incision. He is maintained on Plavix and aspirin. I placed a call this morning to [facility] triage nurse to alert Dr. Sharol Given. Dressing removed. Small amount of bright red blood on dressing and no active bleeding, but a small amount of oozing from several points along staple line. Flaps are well perfused, erythematous with moderate edema. I cleansed the staple line with sterile normal saline and placed Telfa, Kelix and ACE wrap. I advised him not to allow residual limb to hang in dependent position and to elevate on two pillows while in bed. Will start doxycycline and check CBC in AM.

## 2022-08-01 NOTE — Progress Notes (Signed)
PROGRESS NOTE   Subjective/Complaints: No new complaints this morning Still feels a little confused, but this has improved. Discussed stopping the amitriptyline and he is agreeable Moving bowels regularly.   ROS: +residual limb and phantom limb pain, +constipation, +insomnia, +increased confusion since starting cymbalta- improved since stopping   Review of Systems  Constitutional:  Negative for chills and fever.  Respiratory:  Negative for shortness of breath.   Cardiovascular:  Negative for chest pain.       Chest wall soreness  Gastrointestinal:  Negative for abdominal pain, diarrhea, nausea and vomiting.  Musculoskeletal:  Positive for back pain and joint pain.     Objective:   CT HEAD WO CONTRAST (5MM)  Result Date: 07/31/2022 CLINICAL DATA:  Unwitnessed fall with confusion EXAM: CT HEAD WITHOUT CONTRAST TECHNIQUE: Contiguous axial images were obtained from the base of the skull through the vertex without intravenous contrast. RADIATION DOSE REDUCTION: This exam was performed according to the departmental dose-optimization program which includes automated exposure control, adjustment of the mA and/or kV according to patient size and/or use of iterative reconstruction technique. COMPARISON:  None Available. FINDINGS: Brain: No evidence of acute infarction, hemorrhage, mass, mass effect, or midline shift. No hydrocephalus or extra-axial fluid collection. Vascular: No hyperdense vessel. Skull: Normal. Negative for fracture or focal lesion. Sinuses/Orbits: No acute finding. Other: The mastoid air cells are well aerated. IMPRESSION: No acute intracranial process. Electronically Signed   By: Merilyn Baba M.D.   On: 07/31/2022 12:22   No results for input(s): "WBC", "HGB", "HCT", "PLT" in the last 72 hours.  No results for input(s): "NA", "K", "CL", "CO2", "GLUCOSE", "BUN", "CREATININE", "CALCIUM" in the last 72  hours.   Intake/Output Summary (Last 24 hours) at 08/01/2022 0917 Last data filed at 08/01/2022 0806 Gross per 24 hour  Intake 920 ml  Output 1325 ml  Net -405 ml         Physical Exam: Vital Signs Blood pressure 132/84, pulse 71, temperature 98.2 F (36.8 C), temperature source Oral, resp. rate 18, height '6\' 1"'$  (1.854 m), weight 80.9 kg, SpO2 100 %.    General: Alert and oriented x 3, No apparent distress, BMI 23.53 HEENT: Head is normocephalic, atraumatic, PERRLA, EOMI, sclera anicteric, oral mucosa pink and moist Neck: Supple without JVD or lymphadenopathy Heart: Bradycardia Chest: CTA bilaterally without wheezes, rales, or rhonchi; no distress Chest wall and sternal tenderness Abdomen: Soft, non-tender, non-distended, bowel sounds positive. Extremities: No clubbing, cyanosis, or edema. Pulses are 2+ Psych: Pt's affect is appropriate. Pt is cooperative Skin: warm and dry, limb guard in place Musculoskeletal:     Cervical back: Neck supple.     Comments: Active pressure dressing in place left residual limb with good seal.      LLE - 3+/5 hip flexion, limited by pain     RLE, BL UE - 5/5 throughout     + R foot resting plantarflexion tone Neurological:     Sensation: Decreased to light touch over right foot, ankle    Mental Status: He is alert and oriented to person, place, and time.  Mild twitching    Assessment/Plan: 1. Functional deficits which require 3+ hours per  day of interdisciplinary therapy in a comprehensive inpatient rehab setting. Physiatrist is providing close team supervision and 24 hour management of active medical problems listed below. Physiatrist and rehab team continue to assess barriers to discharge/monitor patient progress toward functional and medical goals  Care Tool:  Bathing    Body parts bathed by patient: Right arm, Left arm, Chest, Abdomen, Front perineal area, Buttocks, Right upper leg, Left upper leg, Right lower leg, Face     Body  parts n/a: Left lower leg   Bathing assist Assist Level: Set up assist     Upper Body Dressing/Undressing Upper body dressing   What is the patient wearing?: Pull over shirt    Upper body assist Assist Level: Set up assist    Lower Body Dressing/Undressing Lower body dressing      What is the patient wearing?: Underwear/pull up, Pants     Lower body assist Assist for lower body dressing: Set up assist     Toileting Toileting    Toileting assist Assist for toileting: Set up assist Assistive Device Comment: urinal   Transfers Chair/bed transfer  Transfers assist     Chair/bed transfer assist level: Contact Guard/Touching assist     Locomotion Ambulation   Ambulation assist      Assist level: Contact Guard/Touching assist Assistive device: Walker-rolling Max distance: 18f   Walk 10 feet activity   Assist     Assist level: Contact Guard/Touching assist Assistive device: Walker-rolling   Walk 50 feet activity   Assist Walk 50 feet with 2 turns activity did not occur: Safety/medical concerns         Walk 150 feet activity   Assist Walk 150 feet activity did not occur: Safety/medical concerns         Walk 10 feet on uneven surface  activity   Assist Walk 10 feet on uneven surfaces activity did not occur: Safety/medical concerns         Wheelchair     Assist Is the patient using a wheelchair?: Yes Type of Wheelchair: Manual    Wheelchair assist level: Independent Max wheelchair distance: 1566f   Wheelchair 50 feet with 2 turns activity    Assist    Wheelchair 50 feet with 2 turns activity did not occur: Safety/medical concerns   Assist Level: Independent   Wheelchair 150 feet activity     Assist  Wheelchair 150 feet activity did not occur: Safety/medical concerns   Assist Level: Independent   Blood pressure 132/84, pulse 71, temperature 98.2 F (36.8 C), temperature source Oral, resp. rate 18, height  '6\' 1"'$  (1.854 m), weight 80.9 kg, SpO2 100 %.  Medical Problem List and Plan: 1. Functional deficits secondary to left below the knee amputation, osteoarthritis with chronic pain.             -patient may not shower             -ELOS/Goals: 7-10 days  Continue CIR  -PRAFO has been ordered on right 2.  Antithrombotics: -DVT/anticoagulation:  Pharmaceutical: Eliquis             -antiplatelet therapy: Plavix 3. Pain Management (acute post-op, chronic chest/back): Fentanyl patch.  Oxycodone, Robaxin, Tylenol as needed  Discontinue cymbalta in case contributing to confusion             - continue Fentanyl patch 75 mcg/hr OP medication, per PDMP last filled 06/06/22 by Dr. [facility]Florencia [facility] (15 days supply)                       -  Restarted inpatient 10/6                       - Of note, documented insurance denial for further patches 8/21 d/t no pain contract  Increase gabapentin to '200mg'$  TID   Discontinue amitriptyline given confusion 4. Insomnia: discontiue amitriptyline given confusion 5. Neuropsych/cognition: This patient is capable of making decisions on his own behalf. 6. Incisional wound: vac removed, size 3X shrinker ordered 7. Fluids/Electrolytes/Nutrition: Routine I's and O's             -continue carb modified diet             -continue vitamin C, zinc, Juven supplements 8: CAD/ Paroxysmal A fib: left heart cath 05/2022; stents to LAD             -Restarted on Eliquis 10/5             -continue Toprol XL 25 mg TID             - Intermittent tachycardia likely d/t poor pain control    9: T2DM c/b peripheral neuropathy: CBGs q AC and q HS             -continue Levemir 8 units daily             -continue Farxiga 10 mg daily  -placed nursing order to not provide any juice or soda             -SSI CBG (last 3)  Recent Labs    07/31/22 1634 07/31/22 2104 08/01/22 0553  GLUCAP 183* 176* 112*     10: Hyperlipidemia: continue Zetia, Lipitor 11: Urothelial cancer s/p  nephrectomy 12: Tobacco use: cessation counseling; continue nicotine patches 13: Anemia>chronic disease,acute blood loss: follow-up CBC  -10/7 HGB 8/5  14: Hyponatremia: DC IVF at admission. Is/Os negative; follow-up BMP  -10/7 Improved to 132 ,   recheck tomorrow 15. Right foot plantarflexion tone: Order PRAFO on IPR admission.  16. Low albumin  -Continue protein supplement, eating 75-100% meals 17. History of chronic costochondritis/chest wall arthritis  on fentanyl 75  -Lidocaine patch started 18. Vitamin D deficiency, level 17.42: start ergocalciferol 50,000U once per week for 7 weeks 19. Suboptimal magnesium level: start magneisum gluconate '250mg'$  HS 20. Constipation: start milk of magnesia 63m daily 21. Increased confusion as per therapy: Head CT ordered and is stable  LOS: 7 days A FACE TO FACE EVALUATION WAS PERFORMED  KClide DeutscherRaulkar 08/01/2022, 9:17 AM

## 2022-08-01 NOTE — Consult Note (Signed)
Neuropsychological Consultation   Patient:   Robert Burnett   DOB:   November 06, 1960  MR Number:  [id number]  Location:  [facility] [address] [facility] [address] [place] [id number] [place] [address] Dept: [place]: [phone]           Date of Service:   07/30/2022  Start Time:   3 PM End Time:   4 PM  Provider/Observer:  Robert Burnett, Psy.D.       Clinical Neuropsychologist       Billing Code/Service: [id number]  Chief Complaint:    Robert Burnett is a 61 year old male with a past medical history including diabetes, osteomyelitis of left foot.  Patient presented to [facility] emergency department on 07/22/2022 with complaints of worsening pain and swelling in his left foot.  Patient had numerous medical interventions of this foot prior.  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  Patient was evaluated during acute recovery phase and referred for comprehensive inpatient rehabilitation interventions.  Reason for Service:  Patient was referred for neuropsychological consultation due to coping and adjustment in the setting of recent left BKA and a history of depression and chronic pain symptoms.  Below is the HPI for the current admission.  HPI: Robert Burnett is a 61 year old male With a history of type 2 diabetes mellitus and osteomyelitis of the left foot.  He presented to [facility] emergency department on 07/22/2022 with complaints of worsening pain and swelling of his left foot.  He endorsed purulent drainage.  Previously undergone toe amputation and amputation Lisfranc and tarsal left foot, delayed secondary closure of surgical wound and Achilles tendon lengthening by Robert Burnett on 03/28/2022.  His medical history is significant for coronary artery disease and underwent left heart catheterization was placed to the LAD by Dr. Daneen Burnett on 817/2023.  He is maintained on Eliquis.  Podiatry was consulted and MRI of the left foot  obtained.  He underwent left BKA by Dr. Sharol Burnett on 10/4.  He required 2 units packed red blood cells secondary to acute blood loss anemia atop anemia of chronic disease.  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  He underwent TURP as well.   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  He was under the care of Robert Burnett for pain management.  The patient states her practice is closed and he has a primary care appointment at [facility] on 10/16.  He had fentanyl patch 75 mcg/h #5 last prescribed on 8/18 by Robert Burnett.   His primary complaint today is left residual limb pain, left thigh and hip pain and sternal and rib pain he attributes to the wet weather outside. The patient requires inpatient physical medicine and rehabilitation evaluations and treatment secondary to dysfunction due to left below-knee amputation and chronic pain.  Current Status:  Patient was awake and alert upon coming into the room.  Patient talked about his history with depression and struggles with significant pain and treatment with fentanyl patches in the past.  Patient reports that he has had multiple problems with his foot prior to amputation and understands the need but continues to stress over the loss of his left foot.  Patient reports that his depression is continued rather stable and reports that it has not been significant or severe and that his frustration he feels like is appropriate to setting.  Behavioral Observation: Robert Burnett  presents as a 60 y.o.-year-old Right handed Caucasian Male who appeared his stated age. his dress was Appropriate and he was Well Groomed and his manners were Appropriate to the situation.  his participation was indicative of Appropriate behaviors.  There were physical disabilities noted.  he displayed an appropriate level of cooperation and motivation.     Interactions:    Active  Appropriate  Attention:   abnormal and attention span appeared shorter than expected for age  Memory:   within normal limits; recent and remote memory intact  Visuo-spatial:  not examined  Speech (Volume):  normal  Speech:   normal; normal  Thought Process:  Coherent and Relevant  Though Content:  WNL; not suicidal and not homicidal  Orientation:   person, place, time/date, and situation  Judgment:   Fair  Planning:   Fair  Affect:    Appropriate  Mood:    Dysphoric  Insight:   Fair  Intelligence:   normal  Medical History:   Past Medical History:  Diagnosis Date   Allergy    Anemia    Arthritis    ARTHRITIS IN SPINE - MAKES PT HAVE CHEST PAIN- USES fENTANYL PATCH    Asthma    IN TEENS    Bacteremia    a. 03/2022 Group B Strep bacteremia in setting of diabetic L foot infxn/gangrene/osteo.   Diabetes mellitus    Diabetic peripheral neuropathy associated with type 2 diabetes mellitus ([place]) 12/12/2011   Fatty liver disease, nonalcoholic 81/44/8185   Foot osteomyelitis, left ([place])    a. 2012 s/p toe amputations on L; b. 03/2022 - admitted w/ wet gangrene and necrotizing infxn w/ osteomyelitis-->s/p transmetatarsal followed by Lisfranc amputation.   GERD (gastroesophageal reflux disease)    H/O degenerative disc disease    History of echocardiogram    a. 03/2022 Echo: EF 55-60%, no rwma, nl RV fxn, mildly elev PASP. Mild MR.   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[facility])          Patient Active Problem List   Diagnosis Date Noted   S/P BKA (below knee amputation) unilateral, left ([place])    Osteomyelitis of left lower extremity ([place]) 07/25/2022   Cutaneous abscess of left ankle 07/23/2022   Cellulitis of left foot    Foot osteomyelitis, left ([place]) 07/22/2022   CAD S/P percutaneous coronary angioplasty 07/22/2022   Ischemic cardiomyopathy 07/22/2022   CKD (chronic kidney disease) stage 2, GFR 60-89 ml/min  07/22/2022   Normocytic anemia 07/22/2022   Tobacco abuse 07/22/2022   PAF (paroxysmal atrial fibrillation) ([place]) 07/22/2022   Hyponatremia 07/22/2022   NSTEMI (non-ST elevated myocardial infarction) ([place]) 06/04/2022   Type 2 diabetes mellitus with complication, with long-term current use of insulin ([place]) 06/04/2022   Hypomagnesemia 06/04/2022   Hyperlipidemia 06/04/2022   Diabetic ulcer of left foot ([place]) 06/04/2022   History of transmetatarsal amputation of foot ([place]) 04/30/2022   Gangrene of left foot ([place]) 03/25/2022   Hypokalemia 03/25/2022   Severe protein-calorie malnutrition ([place]) 03/25/2022   Pyogenic inflammation of bone ([facility])    Necrotizing soft tissue infection    Abscess of tendon sheath, left ankle and foot    Lateral epicondylitis of left elbow 08/01/2021   Ureteral cancer, 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   Proteinuria 09/19/2018   Hematuria 09/19/2018   Anterior cervical adenopathy 09/19/2018   Fatty liver  disease, nonalcoholic 75/17/0017   Hypertension 06/27/2016   Thoracic spondylosis with myelopathy 03/16/2013   Carpal tunnel syndrome 03/16/2013   Chest pain 03/16/2013   Type 2 diabetes mellitus with left diabetic foot infection ([place]) 03/16/2013   Debility 03/16/2013   Chronic pain syndrome 03/16/2013   Lumbar degenerative disc disease 12/12/2011   Costochondritis 12/12/2011   Diabetic peripheral neuropathy associated with type 2 diabetes mellitus ([place]) 12/12/2011     Psychiatric History:  No significant prior psychiatric history  Family Med/Psych History:  Family History  Problem Relation Age of Onset   Cancer Mother    Hypertension Father    Diabetes Father    Lung cancer Father    Diabetes Sister    Cancer Sister    Impression/DX:  MAKALE PINDELL is a 61 year old male with a past medical history including diabetes, osteomyelitis of left foot.  Patient presented to [facility] emergency department on 07/22/2022 with complaints of worsening pain and swelling  in his left foot.  Patient had numerous medical interventions of this foot prior.  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  Patient was evaluated during acute recovery phase and referred for comprehensive inpatient rehabilitation interventions.  Patient was awake and alert upon coming into the room.  Patient talked about his history with depression and struggles with significant pain and treatment with fentanyl patches in the past.  Patient reports that he has had multiple problems with his foot prior to amputation and understands the need but continues to stress over the loss of his left foot.  Patient reports that his depression is continued rather stable and reports that it has not been significant or severe and that his frustration he feels like is appropriate to setting.  Disposition/Plan:  Today we worked on coping and adjustment issues with recent left BKA and extended hospital stay.          Electronically Signed   _______________________ Robert Burnett, Psy.D. Clinical Neuropsychologist

## 2022-08-01 NOTE — Telephone Encounter (Signed)
Katharine Look PA called regarding patient she stated that the day before yesterday the patient fell he is having some drainage but the incision isn't open. Dr Nechama Guard wanted to speak with Sharol Given regarding patient.  His room number is 4 mid [place] call back number [phone]

## 2022-08-01 NOTE — Progress Notes (Signed)
Physical Therapy Session Note  Patient Details  Name: Robert Burnett MRN: [id number] Date of Birth: September 25, 1961  Today's Date: 08/01/2022 PT Individual Time: [phone] and 1300-1355 PT Individual Time Calculation (min): 55 min and 55 min  Short Term Goals: Week 1:  PT Short Term Goal 1 (Week 1): STG = LTG d/t ELOS Week 2:  PT Short Term Goal 1 (Week 2): STG=LTG due to extended LOS  Skilled Therapeutic Interventions/Progress Updates:  Ambulation/gait training;Community reintegration;DME/adaptive equipment instruction;Neuromuscular re-education;Psychosocial support;Stair training;UE/LE Strength taining/ROM;Wheelchair propulsion/positioning;Balance/vestibular training;Discharge planning;Functional electrical stimulation;Pain management;Therapeutic Activities;Skin care/wound management;UE/LE Coordination activities;Cognitive remediation/compensation;Disease management/prevention;Functional mobility training;Patient/family education;Splinting/orthotics;Therapeutic Exercise;Visual/perceptual remediation/compensation   Today's Interventions: Treatment Session 1 Received pt sitting in recliner with RN present administering medications. Pt agreeable to PT treatment and reported pain 5/10 in L residual limb - repositioning, rest breaks, and distraction done to reduce pain levels. Session with emphasis on functional mobility/transfers, generalized strengthening and endurance, dynamic standing balance/coordination, and gait training. Noted residual limb bleeding heavily through shrinker, gauze, and onto pillow and recliner. Removed soiled dressing and applied salene solution to loosen grip from shrinker and applied non-adherent pad, 3XL shrinker, gauze, and ace wrap to re-dress limb - notified treatment team. Donned limb guard with supervision while therapist washed soiled shrinker and laid to air dry - educated pt on proper shrinker care. Pt transferred recliner<>WC stand<>pivot with RW and CGA and  transported to/from room in [facility] dependently per pt request due to increasing hip pain. Pt stood with RW and CGA and ambulated 24f with RW and CGA - 1 instance of R toe catching requiring min A for balance. RN arrived to administer more pain medication and pt performed the following exercises with emphasis on UE strength/endurance: -tricep extensions on yoga blocks 2x10 -seated trunk rotation with 4.4lb 2x10 bilaterally Pt transferred mat<>WC stand<>pivot with RW and CGA and requested to return to bed at end of session. Pt stood with RW and CGA and ambulated 527fwith RW and CGA to bed and transferred sit<>supine mod I. Concluded session with pt semi-reclined in bed, needs within reach, and bed alarm on.   Treatment Session 2 Received pt sitting upright in bed, pt agreeable to PT treatment, and reported pain 5/10 in L residual limb (premedicated) - RN arrived at end of session to administer medication. Pt requested to finish lunch and while eating discussed pt's new and chronic pain history and importance of staying mobile. Session with emphasis on functional mobility/transfers, generalized strengthening and endurance, simulated car transfers, dynamic standing balance/coordination, and ambulation. Pt transferred semi-reclined<>sitting EOB mod I and donned L limb guard with supervision. Pt transferred bed<>WC stand<>pivot with RW and CGA and [facility] for time management purposes. Pt performed simulated car transfer with RW and CGA, then ambulated 1079fn uneven surfaces (ramp) with RW and CGA - cues for RW safety. Pt with questions regarding disability - notified CSW. Returned to room and pt stood with RW and CGA and ambulated 5ft93fth RW and CGA to bed. Pt transferred sit<>supine mod I and reported feeling like he was not ready for D/C next week - due to high pain levels limiting him in therapy - notified treatment team. Concluded session with pt semi-reclined in bed, needs  within reach, and bed alarm on.   Therapy Documentation Precautions:  Precautions Precautions: Fall Precaution Comments: wound vac; watch HR, constant spinal and sternal OA pain Restrictions Weight Bearing Restrictions: Yes LLE Weight Bearing: Non weight bearing Other Position/Activity Restrictions: limb guard when  OOB - off when in bed  Therapy/Group: Individual Therapy Alfonse Alpers PT, DPT  08/01/2022, 7:07 AM

## 2022-08-01 NOTE — Progress Notes (Signed)
Occupational Therapy Session Note  Patient Details  Name: Robert Burnett MRN: [id number] Date of Birth: Mar 09, 1961  Today's Date: 08/01/2022 OT Individual Time: 1103-1202 & 1446-1511 OT Individual Time Calculation (min): 59 min & 25 min OT missed time: 20 min Missed time reason: PA/nursing care    Short Term Goals: Week 1:  OT Short Term Goal 1 (Week 1): Pt will be CGA for LB dressing OT Short Term Goal 2 (Week 1): Pt will be SBA for functional transfers OT Short Term Goal 3 (Week 1): Pt will demonstrate imporved pain managmeent with techniques utilized OT Short Term Goal 4 (Week 1): Pt will completes UB ADLs with Mod I  Skilled Therapeutic Interventions/Progress Updates:  Session 1 Skilled OT intervention completed with focus on functional transfers, cognitive sequencing needed for ADL management. Pt received upright in bed, finishing with use of urinal. Pt did not indicate specific pain, however stated "I'm doing alright."  Since 10/11, pt continues to present with increased confusion, body twitching and mumbling of words with poor attention at start and throughout session. Pt states that "this medicine is messing me up." Therapist offered encouragement to try non-pharm related pain interventions to minimize medication effects, as pt knows he is negatively effected with his current dosing however continues to request the meds. Pt also did not recall his previous fall until prompted.  Increased time needed for all tasks and transitions. Donned limb guard with supervision this session, pt demonstrating difficulty with sequencing steps with cues needed for correct orientation. Supervision bed mobility to EOB, CGA sit > stand using RW then stand pivot to w/c. Self-propelled in w/c > main gym with supervision.   Seated at table, pt participated in cog task using sliding color pegboard for 3 trials. On first trial pt had min difficulty sequencing that he needed to move pieces out of the way  prior to placing correct pieces in order. On 2nd trial, pt demonstrated max difficulty, with unreasonable amount of time to complete with mod guidance needed, with increased environmental stimuli present in gym. Pt requested to attempt 3rd trial as he demonstrated insight to his  "difficulty with something I wouldn't normally have."   Self propelled in w/c back to room with cues needed for location. Completed CGA sit > stand using RW then step hop transfer to EOB about 7 ft with CGA. Pt did demo shakiness in stance, however no cues needed for safety. Transitioned into bed with supervision, remained upright in bed with limb guard doffed, bed alarm on, fresh ice provided per request and all needs in reach at end of session.  Session 2 Skilled OT intervention completed with focus on residual limb care and education. Pt received seated upright in bed, with no pain indicated. Continues to present with increased confusion. When asked what size shrinker staff has been putting on him he replied "a regular blanket size I think."  L residual limb noted to be soiled in bright red blood. Notified nursing of status with approval to re-wrap at this time. Pt transitioned to EOB with supervision. Therapist doffed ace wrap, gauze, shrinker and non-adherent pad from limb. Education provided to pt on use of self-inspection mirror with one issued to pt for use in hospital and upon d/c. Able to use with cues for visualizing back of leg, with education provided on use beyond when wound heals to check for pressure sores etc with prosthetic.   PA and nurse in room to assess exposed limb. PA requested to take  over direct care of pt with re-wrapping and limb care vs therapist, therefore pt missed 20 mins of OT intervention. Will make up missed time as able. Pt remained at EOB with PA present at direct care handoff.   Therapy Documentation Precautions:  Precautions Precautions: Fall Precaution Comments: wound vac; watch HR,  constant spinal and sternal OA pain Restrictions Weight Bearing Restrictions: Yes LLE Weight Bearing: Non weight bearing Other Position/Activity Restrictions: limb guard when OOB - off when in bed    Therapy/Group: Individual Therapy  [name], MS, OTR/L  08/01/2022, 3:26 PM

## 2022-08-01 NOTE — Progress Notes (Signed)
Occupational Therapy Weekly Progress Note  Patient Details  Name: Robert Burnett MRN: [id number] Date of Birth: 07-02-1961  Beginning of progress report period: July 26, 2022 End of progress report period: August 01, 2022   Patient has met 2 of 4 short term goals. Pt is making steady progress towards LTGs. He is at a CGA sit > stand and step hop ambulatory level using RW and CGA squat pivot, is able to bathe at an overall min A level, dress at an overall min A level and requires min assist for toileting tasks. Pt continues to demonstrate high levels of pain including generalized/chronic OA pain and new phantom pain that has limited the intensity of therapy sessions. Pt has participated in pharm and non-pharm interventions for pain management with care team currently trying to make adjustments to his regimen to assist. Pt sustained fall on 10/12 when attempting to get up on his own and walk to the bathroom. Pt reported forgetting that his L leg was not there, went to step on it, and fell on his residual limb. Pt reported not being injured but increase in pain as a result of the fall, as well as altering medications to help resolve pt's current confusion has set pt back. Pt will need family education training with nephew prior to D/C as well as continued education on limb loss.   Patient continues to demonstrate the following deficits: muscle weakness, decreased cardiorespiratoy endurance, decreased coordination, decreased problem solving, decreased safety awareness, and decreased memory, and decreased standing balance and difficulty maintaining precautions and therefore will continue to benefit from skilled OT intervention to enhance overall performance with BADL, iADL, and Reduce care partner burden.  Patient progressing toward long term goals..  Continue plan of care.  OT Short Term Goals Week 1:  OT Short Term Goal 1 (Week 1): Pt will be CGA for LB dressing OT Short Term Goal 1 - Progress  (Week 1): Not met OT Short Term Goal 2 (Week 1): Pt will be SBA for functional transfers OT Short Term Goal 2 - Progress (Week 1): Met OT Short Term Goal 3 (Week 1): Pt will demonstrate imporved pain managmeent with techniques utilized OT Short Term Goal 3 - Progress (Week 1): Not met OT Short Term Goal 4 (Week 1): Pt will completes UB ADLs with Mod I OT Short Term Goal 4 - Progress (Week 1): Met Week 2:  OT Short Term Goal 1 (Week 2): STG = LTG 2/2 ELOS    Lc Joynt E Cindee Mclester, MS, OTR/L  08/01/2022, 12:07 PM

## 2022-08-01 NOTE — Progress Notes (Signed)
Incision dressing to L BKA changed last night w/ moderate sanguineous drainage. Pt is still very much confused, but can be easily redirectable. He set the bed alarm off multiple times throughout the night by moving around in bed and trying to get up with no real reason for getting up. Pt has been restless throughout the night with small naps.

## 2022-08-02 LAB — CBC WITH DIFFERENTIAL/PLATELET
Abs Immature Granulocytes: 0.02 10*3/uL (ref 0.00–0.07)
Basophils Absolute: 0 10*3/uL (ref 0.0–0.1)
Basophils Relative: 0 %
Eosinophils Absolute: 0.1 10*3/uL (ref 0.0–0.5)
Eosinophils Relative: 2 %
HCT: 21.5 % — ABNORMAL LOW (ref 39.0–52.0)
Hemoglobin: 7 g/dL — ABNORMAL LOW (ref 13.0–17.0)
Immature Granulocytes: 0 %
Lymphocytes Relative: 20 %
Lymphs Abs: 1.3 10*3/uL (ref 0.7–4.0)
MCH: 26.4 pg (ref 26.0–34.0)
MCHC: 32.6 g/dL (ref 30.0–36.0)
MCV: 81.1 fL (ref 80.0–100.0)
Monocytes Absolute: 0.4 10*3/uL (ref 0.1–1.0)
Monocytes Relative: 7 %
Neutro Abs: 4.3 10*3/uL (ref 1.7–7.7)
Neutrophils Relative %: 71 %
Platelets: 239 10*3/uL (ref 150–400)
RBC: 2.65 MIL/uL — ABNORMAL LOW (ref 4.22–5.81)
RDW: 15.9 % — ABNORMAL HIGH (ref 11.5–15.5)
WBC: 6.2 10*3/uL (ref 4.0–10.5)
nRBC: 0 % (ref 0.0–0.2)

## 2022-08-02 LAB — GLUCOSE, CAPILLARY
Glucose-Capillary: 107 mg/dL — ABNORMAL HIGH (ref 70–99)
Glucose-Capillary: 154 mg/dL — ABNORMAL HIGH (ref 70–99)
Glucose-Capillary: 152 mg/dL — ABNORMAL HIGH (ref 70–99)
Glucose-Capillary: 163 mg/dL — ABNORMAL HIGH (ref 70–99)

## 2022-08-02 MED ORDER — DICLOFENAC SODIUM 1 % EX GEL
2.0000 g | Freq: Four times a day (QID) | CUTANEOUS | Status: DC | PRN
  Administered 2022-08-02: 2 g via TOPICAL
  Filled 2022-08-02: qty 100

## 2022-08-02 NOTE — Progress Notes (Signed)
Pt has remained cooperative and compliment with safety and fall precautions. Pt is a/o 4 and answers questions appropriately. Pt states he believes his new onset of confusion is from baclofen or Cymbalta and states the baclofen made "me think all crazy and out of my head." Pt had an uneventful night outside of L leg pain and L hip pain in which he was given prn Oxy  robaxin.

## 2022-08-02 NOTE — Progress Notes (Signed)
Called for ordered kpad, per [facility] no kpads are available at this time.

## 2022-08-02 NOTE — Progress Notes (Signed)
Physical Therapy Session Note  Patient Details  Name: Robert Burnett MRN: [id number] Date of Birth: 10-12-1961  Today's Date: 08/02/2022 PT Individual Time: 1346-1446 PT Individual Time Calculation (min): 60 min   Short Term Goals: Week 1:  PT Short Term Goal 1 (Week 1): STG = LTG d/t ELOS  Skilled Therapeutic Interventions/Progress Updates:    Chart reviewed and pt agreeable to therapy. Pt received semi-reclined in bed with no c/o pain. Also of note, pt stated this afternoon was first time he was feeling a little better since 10/12 incident and had focused on resting leg as much as possible. Session focused on functional transfers to promote home access and safety discussion and planning for return to home. Pt initiated session with transfer to EOB with ModI. Pt then verbalized step-by-step procedure for safe set and up and transfer. Pt required CGA + RW for transfer to [facility]. In [facility], pt discussed current home set up and plans for safe bathroom and showering. Pt also discussed plan for home changes to accommodate [facility] and modify home entry. Pt then completed 3x sit to stand with MinA and returned to bed with MinA + WC. At end of session, pt was left semi-reclined in [facility] with alarm engaged, nurse call bell and all needs in reach.    Therapy Documentation Precautions:  Precautions Precautions: Fall Precaution Comments: wound vac; watch HR, constant spinal and sternal OA pain Restrictions Weight Bearing Restrictions: Yes LLE Weight Bearing: Non weight bearing Other Position/Activity Restrictions: limb guard when OOB - off when in bed    Therapy/Group: Individual Therapy  Marquette Old, PT, DPT 08/02/2022, 4:26 PM

## 2022-08-02 NOTE — Progress Notes (Signed)
Occupational Therapy Session Note  Patient Details  Name: Robert Burnett MRN: [id number] Date of Birth: [birth year]  Today's Date: 08/02/2022 OT Individual Time: 0905-1000 OT Individual Time Calculation (min): 55 min    Short Term Goals: Week 2:  OT Short Term Goal 1 (Week 2): STG = LTG 2/2 ELOS  Skilled Therapeutic Interventions/Progress Updates:  Skilled OT intervention completed with focus on ADL retraining. Pt received upright in bed, with labs finishing blood draw. Pt indicated 7/10 pain in residual limb when trying to move it, a 6/10 when at rest, pre-medicated with therapist offering rest breaks and modified tasks for pain reduction throughout. Pt was less confused and more oriented this session, however was tangential about pain meds and "people thinking I'm a drug addict when I'm not."  Completed transition to EOB with supervision, then utilized urinal for toileting with mod I. Offered pt the chance to shower however pt politely declined. Bathing/dressing completed at [facility] with supervision for UB, and supervision for LB at the seated level using lateral leans for peri-areas. Grooming and oral hygiene with mod I. Cues needed for attending to next step during self-care as pt was perseverative and with difficulty multi-tasking conversation.   Discussed pt's plans for bathing/dressing at home, with pt indicating he plans to shower at home. Education provided about importance of practicing full showers in hospital to prepare for discharge. Advised that pt will need to have nephew present for supervising shower due to the complexity of the task and safety concerns that pt may need cues for cog/safety reasons. Pt was unable to report when his nephew could come in for family education, with therapist re-enforcing the need for him to attend a session to go over safety strategies and recommendations for completion of transfers and self-care tasks at home.  Pt remained upright in bed, with residual  limb propped on pillow for elevation and comfort, bed alarm on and all needs in reach at end of session.   Therapy Documentation Precautions:  Precautions Precautions: Fall Precaution Comments: wound vac; watch HR, constant spinal and sternal OA pain Restrictions Weight Bearing Restrictions: Yes LLE Weight Bearing: Non weight bearing Other Position/Activity Restrictions: limb guard when OOB - off when in bed    Therapy/Group: Individual Therapy  [name], MS, OTR/L  08/02/2022, 12:12 PM

## 2022-08-02 NOTE — Progress Notes (Signed)
PROGRESS NOTE   Subjective/Complaints: Much less confused this morning. He believes the amitriptyline and cymbalta contributed to confusion- will add these as allergies in his chart as per his request. He asks if we can help him with pain management outpatient  ROS: +residual limb and phantom limb pain, +constipation, +insomnia, +increased confusion since starting cymbalta- improved since stopping, +chronic costochondritis   Review of Systems  Constitutional:  Negative for chills and fever.  Respiratory:  Negative for shortness of breath.   Cardiovascular:  Negative for chest pain.       Chest wall soreness  Gastrointestinal:  Negative for abdominal pain, diarrhea, nausea and vomiting.  Musculoskeletal:  Positive for back pain and joint pain.     Objective:   CT HEAD WO CONTRAST (5MM)  Result Date: 07/31/2022 CLINICAL DATA:  Unwitnessed fall with confusion EXAM: CT HEAD WITHOUT CONTRAST TECHNIQUE: Contiguous axial images were obtained from the base of the skull through the vertex without intravenous contrast. RADIATION DOSE REDUCTION: This exam was performed according to the departmental dose-optimization program which includes automated exposure control, adjustment of the mA and/or kV according to patient size and/or use of iterative reconstruction technique. COMPARISON:  None Available. FINDINGS: Brain: No evidence of acute infarction, hemorrhage, mass, mass effect, or midline shift. No hydrocephalus or extra-axial fluid collection. Vascular: No hyperdense vessel. Skull: Normal. Negative for fracture or focal lesion. Sinuses/Orbits: No acute finding. Other: The mastoid air cells are well aerated. IMPRESSION: No acute intracranial process. Electronically Signed   By: Merilyn Baba M.D.   On: 07/31/2022 12:22   Recent Labs    08/02/22 0910  WBC 6.2  HGB 7.0*  HCT 21.5*  PLT 239    No results for input(s): "NA", "K", "CL",  "CO2", "GLUCOSE", "BUN", "CREATININE", "CALCIUM" in the last 72 hours.   Intake/Output Summary (Last 24 hours) at 08/02/2022 1127 Last data filed at 08/02/2022 0300 Gross per 24 hour  Intake 1291 ml  Output 900 ml  Net 391 ml         Physical Exam: Vital Signs Blood pressure 139/67, pulse 62, temperature 98.2 F (36.8 C), resp. rate 18, height '6\' 1"'$  (1.854 m), weight 81.2 kg, SpO2 100 %.    General: Alert and oriented x 3, No apparent distress, BMI 23.62 HEENT: Head is normocephalic, atraumatic, PERRLA, EOMI, sclera anicteric, oral mucosa pink and moist Neck: Supple without JVD or lymphadenopathy Heart: Bradycardia Chest: CTA bilaterally without wheezes, rales, or rhonchi; no distress Chest wall and sternal tenderness Abdomen: Soft, non-tender, non-distended, bowel sounds positive. Extremities: No clubbing, cyanosis, or edema. Pulses are 2+ Psych: Pt's affect is appropriate. Pt is cooperative Skin: warm and dry, limb guard in place Musculoskeletal:     Cervical back: Neck supple.     Comments: Active pressure dressing in place left residual limb with good seal.      LLE - 3+/5 hip flexion, limited by pain     RLE, BL UE - 5/5 throughout     + R foot resting plantarflexion tone Neurological:     Sensation: Decreased to light touch over right foot, ankle    Mental Status: He is alert and oriented to person,  place, and time.  Mild twitching has resolved    Assessment/Plan: 1. Functional deficits which require 3+ hours per day of interdisciplinary therapy in a comprehensive inpatient rehab setting. Physiatrist is providing close team supervision and 24 hour management of active medical problems listed below. Physiatrist and rehab team continue to assess barriers to discharge/monitor patient progress toward functional and medical goals  Care Tool:  Bathing    Body parts bathed by patient: Right arm, Left arm, Chest, Abdomen, Front perineal area, Buttocks, Left upper  leg, Right upper leg, Right lower leg, Face     Body parts n/a: Left lower leg   Bathing assist Assist Level: Supervision/Verbal cueing     Upper Body Dressing/Undressing Upper body dressing   What is the patient wearing?: Pull over shirt    Upper body assist Assist Level: Set up assist    Lower Body Dressing/Undressing Lower body dressing      What is the patient wearing?: Pants     Lower body assist Assist for lower body dressing: Supervision/Verbal cueing     Toileting Toileting    Toileting assist Assist for toileting: Set up assist Assistive Device Comment: urinal   Transfers Chair/bed transfer  Transfers assist     Chair/bed transfer assist level: Contact Guard/Touching assist     Locomotion Ambulation   Ambulation assist      Assist level: Contact Guard/Touching assist Assistive device: Walker-rolling Max distance: 19f   Walk 10 feet activity   Assist     Assist level: Contact Guard/Touching assist Assistive device: Walker-rolling   Walk 50 feet activity   Assist Walk 50 feet with 2 turns activity did not occur: Safety/medical concerns  Assist level: Contact Guard/Touching assist Assistive device: Walker-rolling    Walk 150 feet activity   Assist Walk 150 feet activity did not occur: Safety/medical concerns         Walk 10 feet on uneven surface  activity   Assist Walk 10 feet on uneven surfaces activity did not occur: Safety/medical concerns         Wheelchair     Assist Is the patient using a wheelchair?: Yes Type of Wheelchair: Manual    Wheelchair assist level: Independent Max wheelchair distance: 1570f   Wheelchair 50 feet with 2 turns activity    Assist    Wheelchair 50 feet with 2 turns activity did not occur: Safety/medical concerns   Assist Level: Independent   Wheelchair 150 feet activity     Assist  Wheelchair 150 feet activity did not occur: Safety/medical concerns   Assist  Level: Independent   Blood pressure 139/67, pulse 62, temperature 98.2 F (36.8 C), resp. rate 18, height '6\' 1"'$  (1.854 m), weight 81.2 kg, SpO2 100 %.  Medical Problem List and Plan: 1. Functional deficits secondary to left below the knee amputation, osteoarthritis with chronic pain.             -patient may not shower             -ELOS/Goals: 7-10 days  Continue CIR  -PRAFO has been ordered on right  Messaged April to schedule TC with EuZella Ballr myself in one week as patient would like [place] take over his pain management 2.  Antithrombotics: -DVT/anticoagulation:  Pharmaceutical: Eliquis             -antiplatelet therapy: Plavix 3. Pain Management (acute post-op, chronic chest/back): Fentanyl patch.  Oxycodone, Robaxin, Tylenol as needed  Discontinue cymbalta in case contributing to confusion             -  continue Fentanyl patch 75 mcg/hr OP medication, per PDMP last filled 06/06/22 by Dr. Florencia [facility] (15 days supply)                       - Restarted inpatient 10/6                       - Of note, documented insurance denial for further patches 8/21 d/t no pain contract  Increase gabapentin to '200mg'$  TID   Discontinue amitriptyline given confusion  Added amitriptyline and Cymbalta to his allergy list 4. Insomnia: discontiue amitriptyline given confusion 5. Neuropsych/cognition: This patient is capable of making decisions on his own behalf. 6. Incisional wound: vac removed, size 3X shrinker ordered 7. Fluids/Electrolytes/Nutrition: Routine I's and O's             -continue carb modified diet             -continue vitamin C, zinc, Juven supplements 8: CAD/ Paroxysmal A fib: left heart cath 05/2022; stents to LAD             -Restarted on Eliquis 10/5             -continue Toprol XL 25 mg TID             - Intermittent tachycardia likely d/t poor pain control    9: T2DM c/b peripheral neuropathy: CBGs q AC and q HS             -continue Levemir 8 units daily              -continue Farxiga 10 mg daily  -placed nursing order to not provide any juice or soda             -SSI CBG (last 3)  Recent Labs    08/01/22 1624 08/01/22 2110 08/02/22 0607  GLUCAP 143* 144* 107*     10: Hyperlipidemia: continue Zetia, Lipitor 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 Is/Os negative; follow-up BMP  -10/7 Improved to 132 ,   recheck tomorrow 15. Right foot plantarflexion tone: Order PRAFO on IPR admission.  16. Low albumin  -Continue protein supplement, eating 75-100% meals 17. History of chronic costochondritis/chest wall arthritis  on fentanyl 75  -Lidocaine patch started 18. Vitamin D deficiency, level 17.42: start ergocalciferol 50,000U once per week for 7 weeks 19. Suboptimal magnesium level: start magneisum gluconate '250mg'$  HS 20. Constipation: continue senna, miralax, and magnesium 21. Increased confusion as per therapy: Head CT ordered and is stable. Resolved with cessation of amitriptyline and cymbalta 22. Costochondritis: continue fentanyl patch 23. Chronic left hip pain: aquathermia ordered  LOS: 8 days A FACE TO FACE EVALUATION WAS PERFORMED  Rever Pichette P Yomira Flitton 08/02/2022, 11:27 AM

## 2022-08-03 LAB — OCCULT BLOOD X 1 CARD TO LAB, STOOL: Fecal Occult Bld: POSITIVE — AB

## 2022-08-03 LAB — GLUCOSE, CAPILLARY
Glucose-Capillary: 100 mg/dL — ABNORMAL HIGH (ref 70–99)
Glucose-Capillary: 120 mg/dL — ABNORMAL HIGH (ref 70–99)
Glucose-Capillary: 144 mg/dL — ABNORMAL HIGH (ref 70–99)
Glucose-Capillary: 183 mg/dL — ABNORMAL HIGH (ref 70–99)

## 2022-08-03 LAB — CBC
HCT: 19.9 % — ABNORMAL LOW (ref 39.0–52.0)
Hemoglobin: 6.2 g/dL — CL (ref 13.0–17.0)
MCH: 25.7 pg — ABNORMAL LOW (ref 26.0–34.0)
MCHC: 31.2 g/dL (ref 30.0–36.0)
MCV: 82.6 fL (ref 80.0–100.0)
Platelets: 199 10*3/uL (ref 150–400)
RBC: 2.41 MIL/uL — ABNORMAL LOW (ref 4.22–5.81)
RDW: 16 % — ABNORMAL HIGH (ref 11.5–15.5)
WBC: 4.9 10*3/uL (ref 4.0–10.5)
nRBC: 0 % (ref 0.0–0.2)

## 2022-08-03 LAB — PREPARE RBC (CROSSMATCH)

## 2022-08-03 MED ORDER — SODIUM CHLORIDE 0.9% IV SOLUTION: Freq: Once | INTRAVENOUS | Status: AC

## 2022-08-03 NOTE — Progress Notes (Signed)
Critical lab reported hemoglobin 6.2 see flowsheet for latest vitals. Pt is alert and oriented times 4 pale skin color resp even and unlabored. No other distress at this moment. On cal MD notified of new lab results new orders to follow.   REPEATED TO VERIFY  THIS CRITICAL RESULT HAS VERIFIED AND BEEN CALLED TO Q. Sherriann Szuch,RN BY ZELDA BEECH ON 10 15 2023 AT 0621, AND HAS BEEN READ BACK.

## 2022-08-03 NOTE — Progress Notes (Signed)
Patient ID: Robert Burnett, male   DOB: 1961-03-27, 61 y.o.   MRN: [id number]  Patient received one unit of PRBC at 315 mL. Correct start time and end time are charted in the flow sheet. No adverse reaction noted.

## 2022-08-03 NOTE — Progress Notes (Signed)
One unit of PRBC given as ordered. No S/S of adverse reactions. Patient resting in bed. Left stump TX completed as ordered. Educated patient on cleaning stump daily with soap and water,and  applying prescribed shrinker. Washer shinker at night with soap and water and apply clean shrinker. Understands S/S of infections. Requests PRN oxy for phantom pain 8/10. Upon recheck rates pain 2/3-10. Ice pack given for comfort.

## 2022-08-03 NOTE — Progress Notes (Signed)
PROGRESS NOTE   Subjective/Complaints: Discussed with patient hemoglobin of 6.2. He is feeling well, not newly fatigued but has chronic fatigue. His residual limb bleeding has greatly decreased. Will order 1U PRBCs and hold Robert Burnett today until repeat CBC tomorrow, discussed with pharmacy and nursing, Type and screen ordered, patient consented  ROS: +residual limb and phantom limb pain, +constipation, +insomnia, +increased confusion since starting cymbalta- improved since stopping, +chronic costochondritis, +chronic fatigue   Review of Systems  Constitutional:  Negative for chills and fever.  Respiratory:  Negative for shortness of breath.   Cardiovascular:  Negative for chest pain.       Chest wall soreness  Gastrointestinal:  Negative for abdominal pain, diarrhea, nausea and vomiting.  Musculoskeletal:  Positive for back pain and joint pain.     Objective:   No results found. Recent Labs    08/02/22 0910 08/03/22 0545  WBC 6.2 4.9  HGB 7.0* 6.2*  HCT 21.5* 19.9*  PLT 239 199    No results for input(s): "NA", "K", "CL", "CO2", "GLUCOSE", "BUN", "CREATININE", "CALCIUM" in the last 72 hours.   Intake/Output Summary (Last 24 hours) at 08/03/2022 1104 Last data filed at 08/03/2022 0847 Gross per 24 hour  Intake 1280 ml  Output 1400 ml  Net -120 ml         Physical Exam: Vital Signs Blood pressure 124/61, pulse 68, temperature 98.6 F (37 C), temperature source Oral, resp. rate 15, height '6\' 1"'$  (1.854 m), weight 81.2 kg, SpO2 98 %.    General: Alert and oriented x 3, No apparent distress, BMI 23.62 HEENT: Head is normocephalic, atraumatic, PERRLA, EOMI, sclera anicteric, oral mucosa pink and moist Neck: Supple without JVD or lymphadenopathy Heart: Bradycardia Chest: CTA bilaterally without wheezes, rales, or rhonchi; no distress Chest wall and sternal tenderness Abdomen: Soft, non-tender, non-distended,  bowel sounds positive. Extremities: No clubbing, cyanosis, or edema. Pulses are 2+ Psych: Pt's affect is appropriate. Pt is cooperative Skin: warm and dry, limb guard in place Musculoskeletal:     Cervical back: Neck supple.     Comments: Active pressure dressing in place left residual limb with good seal.      LLE - 3+/5 hip flexion, limited by pain     RLE, BL UE - 5/5 throughout     + R foot resting plantarflexion tone Incision site bleeding decreased Neurological:     Sensation: Decreased to light touch over right foot, ankle    Mental Status: He is alert and oriented to person, place, and time.  Mild twitching has resolved    Assessment/Plan: 1. Functional deficits which require 3+ hours per day of interdisciplinary therapy in a comprehensive inpatient rehab setting. Physiatrist is providing close team supervision and 24 hour management of active medical problems listed below. Physiatrist and rehab team continue to assess barriers to discharge/monitor patient progress toward functional and medical goals  Care Tool:  Bathing    Body parts bathed by patient: Right arm, Left arm, Chest, Abdomen, Front perineal area, Buttocks, Left upper leg, Right upper leg, Right lower leg, Face     Body parts n/a: Left lower leg   Bathing assist Assist Level: Supervision/Verbal cueing  Upper Body Dressing/Undressing Upper body dressing   What is the patient wearing?: Pull over shirt    Upper body assist Assist Level: Set up assist    Lower Body Dressing/Undressing Lower body dressing      What is the patient wearing?: Pants     Lower body assist Assist for lower body dressing: Supervision/Verbal cueing     Toileting Toileting    Toileting assist Assist for toileting: Set up assist Assistive Device Comment: urinal   Transfers Chair/bed transfer  Transfers assist     Chair/bed transfer assist level: Contact Guard/Touching assist      Locomotion Ambulation   Ambulation assist      Assist level: Contact Guard/Touching assist Assistive device: Walker-rolling Max distance: 55f   Walk 10 feet activity   Assist     Assist level: Contact Guard/Touching assist Assistive device: Walker-rolling   Walk 50 feet activity   Assist Walk 50 feet with 2 turns activity did not occur: Safety/medical concerns  Assist level: Contact Guard/Touching assist Assistive device: Walker-rolling    Walk 150 feet activity   Assist Walk 150 feet activity did not occur: Safety/medical concerns         Walk 10 feet on uneven surface  activity   Assist Walk 10 feet on uneven surfaces activity did not occur: Safety/medical concerns         Wheelchair     Assist Is the patient using a wheelchair?: Yes Type of Wheelchair: Manual    Wheelchair assist level: Independent Max wheelchair distance: 1571f   Wheelchair 50 feet with 2 turns activity    Assist    Wheelchair 50 feet with 2 turns activity did not occur: Safety/medical concerns   Assist Level: Independent   Wheelchair 150 feet activity     Assist  Wheelchair 150 feet activity did not occur: Safety/medical concerns   Assist Level: Independent   Blood pressure 124/61, pulse 68, temperature 98.6 F (37 C), temperature source Oral, resp. rate 15, height '6\' 1"'$  (1.854 m), weight 81.2 kg, SpO2 98 %.  Medical Problem List and Plan: 1. Functional deficits secondary to left below the knee amputation, osteoarthritis with chronic pain.             -patient may not shower             -ELOS/Goals: 7-10 days  Continue CIR  -PRAFO has been ordered on right  Messaged April to schedule TC with EuZella Ballr myself in one week as patient would like [facility] take over his pain management. Discussed this with patient.  2.  Antithrombotics: -DVT/anticoagulation:  Pharmaceutical: Robert Burnett             -antiplatelet therapy: Robert Burnett 3. Pain Management (acute  post-op, chronic chest/back): Fentanyl patch.  Oxycodone, Robaxin, Tylenol as needed  Discontinue cymbalta in case contributing to confusion             - continue Fentanyl patch 75 mcg/hr OP medication, per PDMP last filled 06/06/22 by Dr[facility] (15 days supply)                       - Restarted inpatient 10/6                       - Of note, documented insurance denial for further patches 8/21 d/t no pain contract  Increase gabapentin to '200mg'$  TID   Discontinue amitriptyline given confusion  Added amitriptyline and Cymbalta to his allergy list 4. Insomnia: discontiue amitriptyline given confusion 5. Neuropsych/cognition: This patient is capable of making decisions on his own behalf. 6. Incisional wound: vac removed, size 3X shrinker ordered 7. Fluids/Electrolytes/Nutrition: Routine I's and O's             -continue carb modified diet             -continue vitamin C, zinc, Juven supplements 8: CAD/ Paroxysmal A fib: left heart cath 05/2022; stents to LAD             -Restarted on Robert Burnett 10/5             -continue Toprol XL 25 mg TID             - Intermittent tachycardia likely d/t poor pain control    9: T2DM c/b peripheral neuropathy: CBGs q AC and q HS             -continue Levemir 8 units daily             -continue Farxiga 10 mg daily  -placed nursing order to not provide any juice or soda             -SSI  D/c protein supplements  Magnesium gluconate '250mg'$  started HS CBG (last 3)  Recent Labs    08/02/22 1619 08/02/22 2042 08/03/22 0553  GLUCAP 152* 163* 100*     10: Hyperlipidemia: continue Zetia, Lipitor 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 Given level 6.2, will hold Robert Burnett today, given 1U PRBCs and repeat CBC tomorrow and restart Robert Burnett if improved 14:  Hyponatremia: DC IVF at admission. Is/Os negative; follow-up BMP  -10/7 Improved to 132 ,   Monitor weekly 15. Right foot plantarflexion tone: Order PRAFO on IPR admission.  16. Low albumin  -educated regarding choosing low added sugar/ high protein meals 17. History of chronic costochondritis/chest wall arthritis  on fentanyl 75  -Lidocaine patch started 18. Vitamin D deficiency, level 17.42: start ergocalciferol 50,000U once per week for 7 weeks 19. Suboptimal magnesium level: start magneisum gluconate '250mg'$  HS 20. Constipation: continue senna, miralax, and magnesium 21. Increased confusion as per therapy: Head CT ordered and is stable. Resolved with cessation of amitriptyline and cymbalta 22. Chronic Costochondritis: continue fentanyl patch 23. Chronic left hip pain: aquathermia ordered  LOS: 9 days A FACE TO FACE EVALUATION WAS PERFORMED  Robert Robert Burnett 08/03/2022, 11:04 AM

## 2022-08-04 ENCOUNTER — Ambulatory Visit: Payer: Medicaid Other | Admitting: Family Medicine

## 2022-08-04 DIAGNOSIS — T8141XA Infection following a procedure, superficial incisional surgical site, initial encounter: Secondary | ICD-10-CM

## 2022-08-04 DIAGNOSIS — D649 Anemia, unspecified: Secondary | ICD-10-CM

## 2022-08-04 DIAGNOSIS — M25552 Pain in left hip: Secondary | ICD-10-CM

## 2022-08-04 DIAGNOSIS — K5900 Constipation, unspecified: Secondary | ICD-10-CM

## 2022-08-04 LAB — CBC WITH DIFFERENTIAL/PLATELET
Abs Immature Granulocytes: 0.02 10*3/uL (ref 0.00–0.07)
Basophils Absolute: 0 10*3/uL (ref 0.0–0.1)
Basophils Relative: 0 %
Eosinophils Absolute: 0.1 10*3/uL (ref 0.0–0.5)
Eosinophils Relative: 2 %
HCT: 21.8 % — ABNORMAL LOW (ref 39.0–52.0)
Hemoglobin: 7.2 g/dL — ABNORMAL LOW (ref 13.0–17.0)
Immature Granulocytes: 0 %
Lymphocytes Relative: 17 %
Lymphs Abs: 1 10*3/uL (ref 0.7–4.0)
MCH: 26.8 pg (ref 26.0–34.0)
MCHC: 33 g/dL (ref 30.0–36.0)
MCV: 81 fL (ref 80.0–100.0)
Monocytes Absolute: 0.4 10*3/uL (ref 0.1–1.0)
Monocytes Relative: 7 %
Neutro Abs: 4.4 10*3/uL (ref 1.7–7.7)
Neutrophils Relative %: 74 %
Platelets: 209 10*3/uL (ref 150–400)
RBC: 2.69 MIL/uL — ABNORMAL LOW (ref 4.22–5.81)
RDW: 16 % — ABNORMAL HIGH (ref 11.5–15.5)
WBC: 6 10*3/uL (ref 4.0–10.5)
nRBC: 0 % (ref 0.0–0.2)

## 2022-08-04 LAB — BASIC METABOLIC PANEL
Anion gap: 11 (ref 5–15)
BUN: 25 mg/dL — ABNORMAL HIGH (ref 8–23)
CO2: 28 mmol/L (ref 22–32)
Calcium: 9.2 mg/dL (ref 8.9–10.3)
Chloride: 99 mmol/L (ref 98–111)
Creatinine, Ser: 1.12 mg/dL (ref 0.61–1.24)
GFR, Estimated: >60 mL/min (ref >60)
Glucose, Bld: 114 mg/dL — ABNORMAL HIGH (ref 70–99)
Potassium: 4 mmol/L (ref 3.5–5.1)
Sodium: 138 mmol/L (ref 135–145)

## 2022-08-04 LAB — GLUCOSE, CAPILLARY
Glucose-Capillary: 187 mg/dL — ABNORMAL HIGH (ref 70–99)
Glucose-Capillary: 110 mg/dL — ABNORMAL HIGH (ref 70–99)
Glucose-Capillary: 138 mg/dL — ABNORMAL HIGH (ref 70–99)
Glucose-Capillary: 188 mg/dL — ABNORMAL HIGH (ref 70–99)

## 2022-08-04 MED ORDER — METHOCARBAMOL 750 MG PO TABS
750.0000 mg | ORAL_TABLET | Freq: Three times a day (TID) | ORAL | Status: DC | PRN
  Administered 2022-08-04 – 2022-08-07 (×6): 750 mg via ORAL
  Filled 2022-08-04 (×6): qty 1

## 2022-08-04 NOTE — Telephone Encounter (Signed)
Pt is currently admitted to hospital and Katharine Look is calling back.

## 2022-08-04 NOTE — Progress Notes (Signed)
Physical Therapy Session Note  Patient Details  Name: Robert Burnett MRN: [id number] Date of Birth: 06/03/1961  Today's Date: 08/04/2022 PT Individual Time: [phone] and [id number] PT Individual Time Calculation (min): 70 min and 55 min  Short Term Goals: Week 1:  PT Short Term Goal 1 (Week 1): STG = LTG d/t ELOS Week 2:  PT Short Term Goal 1 (Week 2): STG=LTG due to extended LOS  Skilled Therapeutic Interventions/Progress Updates:   Treatment Session 1 Chart reviewed and noted Hemoglobin level of 6.2 on 10/15 and now 7.2 - pt cleared to participate in therapy per MD. Received pt semi-reclined in bed, pt agreeable to PT treatment, and reported pain 4/10 in L residual limb and in L hip (premedicated). Session with emphasis on functional mobility/transfers, generalized strengthening and endurance, limb care, and curb navigation. Pt found with loose ace wrap over old shrinker from yesterday. Removed ace wrap and pt found to be bleeding through shrinker. Pt practiced removing shrinker (non-adherent pad not on and small piece of skin torn when pt removed shrinker). Placed non-adherent pad on incision and pt practiced donning shrinker with min cues. MD and PA arrived to inspect wound - doffed shrinker for inspection then donned it again, then applied gauze and ace wrap due to pt's tendency to bleed through shrinker - donned limb guard with supervision and min cues.  Pt reported that he forgot to have his nephew take pictures of the equipment he has a home and that he also forgot to ask his friend about building a ramp. Pt also reported his nephew did not come this weekend like he was supposed to, and therefore was unable to attend a therapy session. Wrote reminder for pt to talk to nephew about family ed, determine what equipment he has at home, and talk to friend about getting ramp built. Pt transferred semi-reclined<>sitting EOB mod I and donned R shoe. Pt able to void in urinal independently -  requested therapist step outside for privacy. Pt transferred bed<>WC stand<>pivot with RW and close supervision and performed WC mobility 140f using BUE and mod I to main therapy gym. Practiced ascending/descending 5in curb x 2 trials with RW and min A overall to simulate home entry; but emphasized recommendation for ramp for safety. Robert Burnett from [facility] to inspect limb guard - pt may benefit from smaller size. [facility] and stood with RW and close supervision and ambulated 5110fwith RW and CGA to bed - transferred sit<>supine mod I. Concluded session with pt semi-reclined in bed, needs within reach, and bed alarm on.   Treatment Session 2 Received pt sitting upright in bed eating lunch - set aside to return to eating after session. Pt agreeable to PT treatment and reported pain 4/10 in L residual limb (premedicated). Session with emphasis on functional mobility/transfers, generalized strengthening and endurance, dynamic standing balance/coordination, and gait training. Donned limb guard with supervision and pt transferred semi-reclined<>sitting EOB mod I. Donned R shoe and pt transferred bed<>WC stand<>pivot with RW and close supervision. Pt performed WC mobility >[id number] BUE and supervision to dayroom with emphasis on UE strength and cardiovascular endurance. Educated pt on donning/doffing legrests and pt able to perform with supervision and min cues. Pt stood with RW and close supervision and ambulated [id number] RW and CGA with close WC follow - pt demonstrated excellent ability to land softly on R heel; although reported increased pain in R palm - applied co-band and pt reported instant  relief. Pt then performed the following standing exercises with BUE support on RW and CGA for balance: -L hip flexion x15 -L hip extension 2x15 -L hip abduction x15 Pt transported back to room in [facility] dependently and stood with RW and supervision and ambulated 68f with RW and CGA  to bed. Pt transferred sit<>supine mod I. Concluded session with pt semi-reclined in bed, needs within reach, and bed alarm on. RN present at bedside administering medications. Update from Dr. DSharol Givento have pt in 2XL shrinker - order placed through ortho tech.  Therapy Documentation Precautions:  Precautions Precautions: Fall Precaution Comments: wound vac; watch HR, constant spinal and sternal OA pain Restrictions Weight Bearing Restrictions: No LLE Weight Bearing: Non weight bearing Other Position/Activity Restrictions: limb guard when OOB - off when in bed  Therapy/Group: Individual Therapy AAlfonse AlpersPT, DPT  08/04/2022, 6:58 AM

## 2022-08-04 NOTE — Progress Notes (Addendum)
Minor oozing from incision line persists. Erythema and edema improved. Continue doxcycline.  Messaged [facility] again this morning.       Spoke with Dr. Sharol Given. Will contact [employer] for #2 prosthetic shrinker socks.

## 2022-08-04 NOTE — Progress Notes (Signed)
Occupational Therapy Session Note  Patient Details  Name: Robert Burnett MRN: [id number] Date of Birth: 12-16-1960  Today's Date: 08/04/2022 OT Individual Time: 1050-1205 OT Individual Time Calculation (min): 75 min    Short Term Goals: Week 2:  OT Short Term Goal 1 (Week 2): STG = LTG 2/2 ELOS  Skilled Therapeutic Interventions/Progress Updates:    Upon OT arrival, pt semi recumbent in bed reporting minimal pain in the L LE and is agreeable to OT treatment. Treatment intervention with a focus on self care retraining and functional transfers. Pt donns L LE brace with Setup assist and completes supine to sit transfer with Mod I. Pt completes sit to stand transfer with RW and CGA and ambulates to the bathroom to perform shower transfer with CGA. Pt with decreased eccentric control on descent for first transfer and pt completes a second transfer with improved control with verbal cues provided. Pt performs full shower ADL at the levels below. Pt completes sit to stand transfer from shower bench with CGA using RW and ambulates to EOB with CGA. Pt completes stand to sit transfer with CGA and to donn pants and socks at the levels below. Pt requesting to brush his teeth at sink. Pt performs stand pivot transfer from bed to w/c with Supervision and propels himself to the sink to complete oral care. Pt propels himself back to  EOB and completes stand pivot transfer with Supervision. Pt combs hair and donns cologne. Pt completes sit to supine transfer with Mod I and was left in bed at end of session with all needs met and safety measures in place. NT present at end of session.   Therapy Documentation Precautions:  Precautions Precautions: Fall Precaution Comments: wound vac; watch HR, constant spinal and sternal OA pain Restrictions Weight Bearing Restrictions: No LLE Weight Bearing: Non weight bearing Other Position/Activity Restrictions: limb guard when OOB - off when in bed  ADL: ADL Eating:  Independent Where Assessed-Eating: Bed level Grooming: Modified independent Where Assessed-Grooming: Sitting at sink, Wheelchair Upper Body Bathing: Supervision/safety Where Assessed-Upper Body Bathing: Shower Lower Body Bathing: Supervision/safety (pt performs lateral lean to bathe peri areas) Where Assessed-Lower Body Bathing: Shower Upper Body Dressing: Setup Where Assessed-Upper Body Dressing: Other (Comment) (shower) Lower Body Dressing: Supervision/safety Where Assessed-Lower Body Dressing: Other (Comment), Edge of bed (shower) Toileting: Contact guard Where Assessed-Toileting: Glass blower/designer: Therapist, music Method: Counselling psychologist: Bedside commode (over toilet) Tub/Shower Transfer: Unable to assess Social research officer, government: Curator Method: Heritage manager: Radio broadcast assistant   Therapy/Group: Individual Therapy  Marvetta Gibbons 08/04/2022, 12:18 PM

## 2022-08-04 NOTE — Progress Notes (Signed)
Orthopedic Tech Progress Note Patient Details:  Robert Burnett 03-12-1961 [id number] Called in order for 2XL shrinker socks Patient ID: Robert Burnett, male   DOB: [birth year], 61 y.o.   MRN: [id number]  [name] 08/04/2022, 3:04 PM

## 2022-08-04 NOTE — Telephone Encounter (Signed)
Katharine Look called this morning and would like a call back from Dr. Sharol Given concerning patient.  Stated that patient is on antibiotics.  Cb# [phone].  Please advise.  Thank you.

## 2022-08-04 NOTE — Discharge Summary (Signed)
Physician Discharge Summary  Patient ID: ALOYSIOUS VANGIESON MRN: [id number] DOB/AGE: [birth year] 61 y.o.  Admit date: 07/25/2022 Discharge date: 08/07/2022  Discharge Diagnoses:  Principal Problem:   Osteomyelitis of left lower extremity ([place]) Active Problems:   S/P BKA (below knee amputation) unilateral, left (HCC) DVT prophylaxis CAD/PAF Diabetes mellitus Hyperlipidemia Anemia Tobacco use Urothelial cancer status post nephrectomy Constipation  Discharged Condition: Stable  Significant Diagnostic Studies: CT HEAD WO CONTRAST (5MM)  Result Date: 07/31/2022 CLINICAL DATA:  Unwitnessed fall with confusion EXAM: CT HEAD WITHOUT CONTRAST TECHNIQUE: Contiguous axial images were obtained from the base of the skull through the vertex without intravenous contrast. RADIATION DOSE REDUCTION: This exam was performed according to the departmental dose-optimization program which includes automated exposure control, adjustment of the mA and/or kV according to patient size and/or use of iterative reconstruction technique. COMPARISON:  None Available. FINDINGS: Brain: No evidence of acute infarction, hemorrhage, mass, mass effect, or midline shift. No hydrocephalus or extra-axial fluid collection. Vascular: No hyperdense vessel. Skull: Normal. Negative for fracture or focal lesion. Sinuses/Orbits: No acute finding. Other: The mastoid air cells are well aerated. IMPRESSION: No acute intracranial process. Electronically Signed   By: Merilyn Baba M.D.   On: 07/31/2022 12:22    Labs:  Basic Metabolic Panel: Recent Labs  Lab 08/04/22 0508  NA 138  K 4.0  CL 99  CO2 28  GLUCOSE 114*  BUN 25*  CREATININE 1.12  CALCIUM 9.2    CBC: Recent Labs  Lab 08/02/22 0910 08/03/22 0545 08/04/22 0508 08/05/22 1028  WBC 6.2 4.9 6.0 5.3  NEUTROABS 4.3  --  4.4  --   HGB 7.0* 6.2* 7.2* 7.3*  HCT 21.5* 19.9* 21.8* 22.1*  MCV 81.1 82.6 81.0 81.0  PLT 239 199 209 221    CBG: Recent Labs  Lab  08/05/22 2128 08/06/22 0610 08/06/22 1139 08/06/22 1623 08/06/22 2016  GLUCAP 166* 101* 143* 176* 163*   Family history.  Father with lung cancer Sister with diabetes.  Denies any colon cancer esophageal cancer or rectal cancer  Brief HPI:   ASAAD GULLEY is a 61 y.o. right-handed male with history of type 2 diabetes mellitus and osteomyelitis of the left foot.  [facility]ed to [facility] department 07/22/2022 with complaints of worsening pain and swelling of his left foot.  He endorsed purulent drainage.  Previously undergone toe amputation and amputation Lisfranc and tarsal left foot, delayed secondary closure of surgical wound and Achilles tendon lengthening by Dr. Sherryle Lis 03/28/2022.  Medical history significant for CAD with left heart catheterization by Dr. Daneen Schick 06/05/2022 maintained on Eliquis.  Podiatry was consulted MRI of the left foot obtained.  Underwent left BKA by Dr. Sharol Given 10/4.  He required 2 units packed red blood cells secondary to acute blood loss.  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  Therapy evaluations completed due to patient decreased functional mobility was admitted for a comprehensive rehab program.   Hospital Course: EDRIS FRIEDT was admitted to rehab 07/25/2022 for inpatient therapies to consist of PT, ST and OT at least three hours five days a week. Past admission physiatrist, therapy team and rehab RN have worked together to provide customized collaborative inpatient rehab.  Pertaining to patient's left BKA.  Surgical site dressed and would follow-up with Dr. Sharol Given.  He did have some mild erythema at the wound incision completing course of doxycycline.  Pain management with the use of Voltaren  gel, Neurontin 200 mg 3 times daily with Duragesic patch as well as oxycodone for breakthrough pain.  He was using Robaxin for muscle spasms.  History of CAD /PAF left heart catheterization 8/23 followed by  cardiology services maintained on Eliquis as well as Plavix with no bleeding episodes.  Blood pressure controlled and monitored on present regimen and will need outpatient follow-up.  Blood sugars controlled and monitored on Levemir as well as [place] with full diabetic teaching completed.  Zetia and Lipitor ongoing for hyperlipidemia.  Patient did have history of tobacco use receiving counts regards to cessation of nicotine products.  Anemia of acute on chronic disease and patient was transfused accordingly with latest hemoglobin 7.3.  Bouts of constipation resolved with laxative assistance.     Izora Ribas, MD  Physician Physical Medicine and Rehabilitation Progress Notes    Signed Date of Service:  08/05/2022 10:17 AM   Signed      Expand All Collapse All                                                                                                                                                                                  PROGRESS NOTE     Subjective/Complaints: Mr. Seago feels some pain in residual limb today, but otherwise he is feeling better.  Discussed repeating CBC today He is working with OT  ROS: +residual limb and phantom limb pain, +insomnia, +increased confusion since starting cymbalta- improved since stopping, +chronic costochondritis, +chronic fatigue- stable at this time. Constipation improved after BM yesterday.     Review of Systems  Constitutional:  Negative for chills and fever.  HENT:  Negative for congestion.   Respiratory:  Negative for shortness of breath.   Cardiovascular:  Negative for chest pain.       Chest wall soreness  Gastrointestinal:  Negative for abdominal pain, diarrhea, nausea and vomiting.  Musculoskeletal:  Positive for back pain and joint pain.        Objective:   Imaging Results (Last 48 hours)  No results found.   Recent Labs (last 2 labs)       Recent Labs    08/03/22 0545  08/04/22 0508  WBC 4.9 6.0  HGB 6.2* 7.2*  HCT 19.9* 21.8*  PLT 199 209       Recent Labs (last 2 labs)      Recent Labs    08/04/22 0508  NA 138  K 4.0  CL 99  CO2 28  GLUCOSE 114*  BUN 25*  CREATININE 1.12  CALCIUM 9.2         Intake/Output Summary (Last 24 hours) at 08/05/2022 1018 Last data filed at 08/05/2022  0856    Gross per 24 hour  Intake 240 ml  Output 2700 ml  Net -2460 ml          Physical Exam: Vital Signs Blood pressure (!) 146/69, pulse 73, temperature 98.1 F (36.7 C), temperature source Oral, resp. rate 16, height '6\' 1"'$  (1.854 m), weight 81.2 kg, SpO2 100 %.     General: Alert and oriented x 3, No apparent distress, BMI 23.62 HEENT: Head is normocephalic, atraumatic, PERRLA, EOMI, sclera anicteric, oral mucosa pink and moist Neck: Supple without JVD or lymphadenopathy Heart: Bradycardia Chest: CTA bilaterally without wheezes, rales, or rhonchi; no distress Chest wall and sternal tenderness Abdomen: Soft, non-tender, non-distended, bowel sounds positive. Extremities: No clubbing, cyanosis, or edema. Pulses are 2+ Psych: Pt's affect is appropriate. Pt is cooperative Skin: warm and dry, limb guard in place                Musculoskeletal:     Cervical back: Neck supple.     Comments:      LLE - 3+/5 hip flexion, limited by pain     RLE, BL UE - 5/5 throughout     + R foot resting plantarflexion tone Incision site with less bleeding and less erythema, staples in place Neurological:     Sensation: Decreased to light touch over right foot, ankle    Mental Status: He is alert and oriented to person, place, and time.  Mild twitching has resolved       Assessment/Plan: 1. Functional deficits which require 3+ hours per day of interdisciplinary therapy in a comprehensive inpatient rehab setting. Physiatrist is providing close team supervision and 24 hour management of active medical problems listed below. Physiatrist and rehab team  continue to assess barriers to discharge/monitor patient progress toward functional and medical goals   Care Tool:   Bathing   Body parts bathed by patient: Right arm, Left arm, Chest, Abdomen, Front perineal area, Buttocks, Left upper leg, Right upper leg, Right lower leg, Face    Body parts n/a: Left lower leg    Bathing assist Assist Level: Supervision/Verbal cueing    Upper Body Dressing/Undressing Upper body dressing What is the patient wearing?: Pull over shirt   Upper body assist Assist Level: Set up assist   Lower Body Dressing/Undressing Lower body dressing       What is the patient wearing?: Pants        Lower body assist Assist for lower body dressing: Supervision/Verbal cueing     Toileting Toileting   Toileting assist Assist for toileting: Set up assist Assistive Device Comment: urinal    Transfers Chair/bed transfer   Transfers assist     Chair/bed transfer assist level: Supervision/Verbal cueing    Locomotion Ambulation     Ambulation assist       Assist level: Contact Guard/Touching assist Assistive device: Walker-rolling Max distance: 181f    Walk 10 feet activity     Assist     Assist level: Contact Guard/Touching assist Assistive device: Walker-rolling    Walk 50 feet activity     Assist Walk 50 feet with 2 turns activity did not occur: Safety/medical concerns   Assist level: Contact Guard/Touching assist Assistive device: Walker-rolling      Walk 150 feet activity     Assist Walk 150 feet activity did not occur: Safety/medical concerns   Assist level: Contact Guard/Touching assist Assistive device: Walker-rolling      Walk 10 feet on uneven surface  activity  Assist Walk 10 feet on uneven surfaces activity did not occur: Safety/medical concerns         Wheelchair         Assist Is the patient using a wheelchair?: Yes Type of Wheelchair: Manual   Wheelchair assist level: Independent Max wheelchair  distance: 172f      Wheelchair 50 feet with 2 turns activity       Assist       Wheelchair 50 feet with 2 turns activity did not occur: Safety/medical concerns     Assist Level: Independent    Wheelchair 150 feet activity        Assist   Wheelchair 150 feet activity did not occur: Safety/medical concerns     Assist Level: Independent    Blood pressure (!) 146/69, pulse 73, temperature 98.1 F (36.7 C), temperature source Oral, resp. rate 16, height '6\' 1"'$  (1.854 m), weight 81.2 kg, SpO2 100 %.   Medical Problem List and Plan: 1. Functional deficits secondary to left below the knee amputation, osteoarthritis with chronic pain.             -patient may not shower             -ELOS/Goals: 7-10 days            Continue CIR            -PRAFO has been ordered on right            Messaged April to schedule TC with EZella Ballor myself in one week as patient would like [place] take over his pain management. Discussed this with patient.  2.  Antithrombotics: -DVT/anticoagulation:  Pharmaceutical: Eliquis             -antiplatelet therapy: Plavix 3. Pain Management (acute post-op, chronic chest/back): Fentanyl patch.  Oxycodone, Robaxin, Tylenol as needed. Patient is concerned about receiving Fentanyl patch- [facility] [place] [facility] some before he leaves.             Discontinue cymbalta in case contributing to confusion             - continue Fentanyl patch 75 mcg/hr OP medication, per PDMP [facility]06[facility]                       - Restarted inpatient 10/6                       - Of note, documented insurance denial for further patches 8/21 d/t no pain contract            Increase gabapentin to '200mg'$  TID             Discontinue amitriptyline given confusion            Added amitriptyline and Cymbalta to his allergy list            -10/16 Increase Robaxin dose to '750mg'$  4. Insomnia:  discontiue amitriptyline given confusion 5. Neuropsych/cognition: This patient is capable of making decisions on his own behalf. 6. Incisional wound: vac removed, size 3X shrinker ordered 7. Fluids/Electrolytes/Nutrition: Routine I's and O's             -continue carb modified diet             -continue vitamin C, zinc, Juven supplements 8:  CAD/ Paroxysmal A fib: left heart cath 05/2022; stents to LAD             -Restarted on Eliquis 10/5, held due to bleeding             -continue Toprol XL 25 mg TID             - Intermittent tachycardia likely d/t poor pain control    9: T2DM c/b peripheral neuropathy: CBGs q AC and q HS             -continue Levemir 8 units daily             -continue Farxiga 10 mg daily            -placed nursing order to not provide any juice or soda             -SSI            D/c protein supplements            Magnesium gluconate '250mg'$  started HS CBG (last 3)  Recent Labs (last 2 labs)        Recent Labs    08/04/22 1647 08/04/22 2143 08/05/22 0617  GLUCAP 138* 188* 102*         10: Hyperlipidemia: continue Zetia, Lipitor 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 Given level 6.2, will hold Eliquis today, given 1U PRBCs and repeat CBC tomorrow and restart Eliquis if improved            -10/16 HGB improved to 7.2 this AM            10/17: repeat CBC 14: Hyponatremia: DC IVF at admission. Is/Os negative; follow-up BMP            -10/7 Improved to 132 ,   Monitor weekly 15. Right foot plantarflexion tone: Order PRAFO on IPR admission.  16. Low albumin            -educated regarding choosing low added sugar/ high protein meals 17. History of chronic costochondritis/chest wall arthritis  on fentanyl 75            -Lidocaine patch started 18. Vitamin D deficiency, level  17.42: start ergocalciferol 50,000U once per week for 7 weeks 19. Suboptimal magnesium level: start magneisum gluconate '250mg'$  HS 20. Constipation: continue senna, miralax, and magnesium            -LBM 10/15-improved 21. Increased confusion as per therapy: Head CT ordered and is stable. Resolved with cessation of amitriptyline and cymbalta 22. Chronic Costochondritis: continue fentanyl patch 23. Chronic left hip pain: aquathermia ordered 24. Bleeding and erythema at wound incision            -10/16 He was started on Doxycycline on 10/13. Appears improved today, continue doxycycline     LOS: 11 days A FACE TO FACE EVALUATION WAS PERFORMED   Izora Ribas 08/05/2022, 10:18 AM                Note Details  Author Ranell Patrick, Clide Deutscher, MD File Time 08/05/2022 11:06 AM  Author Type Physician Status Signed  Last Editor Izora Ribas, MD Service Physical Medicine and [place] # [phone] [place] Date 07/25/2022    Blood pressures were monitored on TID basis and controlled and monitored  Diabetes has been monitored with ac/hs  CBG checks and SSI was use prn for tighter BS control. Levemir 8 units daily and Farxiga 10 mg daily continued.    Rehab course: During patient's stay in rehab weekly team conferences were held to monitor patient's progress, set goals and discuss barriers to discharge. At admission, patient required max assist lower body bathing set up upper body bathing supervision supine to sit  He has had improvement in activity tolerance, balance, postural control as well as ability to compensate for deficits. He has had improvement in functional use RUE/LUE  and RLE/LLE as well as improvement in awareness.  Supine to sit supervision.  Performed amatory transfer with a rolling walker contact-guard.  Propelled wheelchair supervision.  Perform car transfers contact-guard.  Performed obstacle course with supervision.  Completed bed mobility edge of bed  with supervision.  Transfers supervision level squat pivot.  Full family teaching completed plan discharge to home       Disposition: Discharge to home    Diet: Diabetic diet  Special Instructions: No driving smoking or alcohol  Stump care antibacterial soap/Dial and water daily  Follow-up with Dr. Sharol Given for staple removal  Medications at discharge. 1.  Tylenol as needed 2.  Vitamin C 1000 mg p.o. daily 3.  Lipitor 80 mg p.o. daily 4.  Farxiga 10 mg daily 5.  Voltaren gel 2 g 4 times daily as needed to affected area 6.  Colace 100 mg daily 7.  Zetia 10 mg p.o. daily 8.  Duragesic patch change every 72 hours 9.  Neurontin 200 mg p.o. 3 times daily 10.  Insulin NPH 70/30 10 units twice daily with meal 11.  Lidoderm patch.  2 patches change as directed 12.  Magnesium gluconate 250 mg p.o. nightly 13.  Robaxin 750 mg every 8 hours as needed muscle spasms 14.  Toprol-XL 25 mg p.o. 3 times daily 15.  NicoDerm patch change as directed 16.  Oxycodone 5 to 10 mg every 4 hours as needed pain 17.  Protonix 40 mg p.o. daily 18.  MiraLAX daily hold for loose stools 19.  Vitamin D 50,000 units every 7 days 20.  Eliquis 5 mg twice daily 21.  Plavix 75 mg daily 22.  Pro scar 5 mg daily 23.  Lasix 40 mg daily as needed for edema 24.  Cozaar 50 mg daily 25.Metanx one tab daily   30-35 minutes were spent completing discharge summary and discharge planning  Discharge Instructions     Ambulatory referral to Physical Medicine Rehab   Complete by: As directed    Moderate complexity follow up 1- 2 weeks left BKA        Follow-up Information     Raulkar, Clide Deutscher, MD Follow up.   Specialty: Physical Medicine and Rehabilitation Why: office to call for appointment Contact information: [address] [place] [address] [phone]         Belva Crome, MD Follow up.   Specialty: Cardiology Why: call for appointment Contact information: [address] [place] [address] [phone]         [name], MD Follow up.   Specialty: Orthopedic Surgery Why: call for appointment Contact information: [place] [address] [phone]                 Signed: Lavon Paganini De Motte 08/07/2022, 4:24 AM

## 2022-08-04 NOTE — Progress Notes (Signed)
PROGRESS NOTE   Subjective/Complaints: HGB improved to 7.2 after 1U PRBC.  Continues to have some R hip pain.  Incision looks better today.     LBM yesterday  ROS: +residual limb and phantom limb pain, +insomnia, +increased confusion since starting cymbalta- improved since stopping, +chronic costochondritis, +chronic fatigue. Constipation improved after BM yesterday.   Review of Systems  Constitutional:  Negative for chills and fever.  HENT:  Negative for congestion.   Respiratory:  Negative for shortness of breath.   Cardiovascular:  Negative for chest pain.       Chest wall soreness  Gastrointestinal:  Negative for abdominal pain, diarrhea, nausea and vomiting.  Musculoskeletal:  Positive for back pain and joint pain.     Objective:   No results found. Recent Labs    08/03/22 0545 08/04/22 0508  WBC 4.9 6.0  HGB 6.2* 7.2*  HCT 19.9* 21.8*  PLT 199 209    Recent Labs    08/04/22 0508  NA 138  K 4.0  CL 99  CO2 28  GLUCOSE 114*  BUN 25*  CREATININE 1.12  CALCIUM 9.2     Intake/Output Summary (Last 24 hours) at 08/04/2022 0912 Last data filed at 08/04/2022 6295 Gross per 24 hour  Intake 1235 ml  Output 2550 ml  Net -1315 ml         Physical Exam: Vital Signs Blood pressure 119/75, pulse 72, temperature 98.7 F (37.1 C), temperature source Oral, resp. rate 18, height '6\' 1"'$  (1.854 m), weight 81.2 kg, SpO2 100 %.    General: Alert and oriented x 3, No apparent distress, BMI 23.62 HEENT: Head is normocephalic, atraumatic, PERRLA, EOMI, sclera anicteric, oral mucosa pink and moist Neck: Supple without JVD or lymphadenopathy Heart: Bradycardia Chest: CTA bilaterally without wheezes, rales, or rhonchi; no distress Chest wall and sternal tenderness Abdomen: Soft, non-tender, non-distended, bowel sounds positive. Extremities: No clubbing, cyanosis, or edema. Pulses are 2+ Psych: Pt's affect is  appropriate. Pt is cooperative Skin: warm and dry, limb guard in place Musculoskeletal:     Cervical back: Neck supple.     Comments:      LLE - 3+/5 hip flexion, limited by pain     RLE, BL UE - 5/5 throughout     + R foot resting plantarflexion tone Incision site with less bleeding and less erythema, staples in place Neurological:     Sensation: Decreased to light touch over right foot, ankle    Mental Status: He is alert and oriented to person, place, and time.  Mild twitching has resolved    Assessment/Plan: 1. Functional deficits which require 3+ hours per day of interdisciplinary therapy in a comprehensive inpatient rehab setting. Physiatrist is providing close team supervision and 24 hour management of active medical problems listed below. Physiatrist and rehab team continue to assess barriers to discharge/monitor patient progress toward functional and medical goals  Care Tool:  Bathing    Body parts bathed by patient: Right arm, Left arm, Chest, Abdomen, Front perineal area, Buttocks, Left upper leg, Right upper leg, Right lower leg, Face     Body parts n/a: Left lower leg   Bathing assist Assist Level:  Supervision/Verbal cueing     Upper Body Dressing/Undressing Upper body dressing   What is the patient wearing?: Pull over shirt    Upper body assist Assist Level: Set up assist    Lower Body Dressing/Undressing Lower body dressing      What is the patient wearing?: Pants     Lower body assist Assist for lower body dressing: Supervision/Verbal cueing     Toileting Toileting    Toileting assist Assist for toileting: Set up assist Assistive Device Comment: urinal   Transfers Chair/bed transfer  Transfers assist     Chair/bed transfer assist level: Contact Guard/Touching assist     Locomotion Ambulation   Ambulation assist      Assist level: Contact Guard/Touching assist Assistive device: Walker-rolling Max distance: 28f   Walk 10 feet  activity   Assist     Assist level: Contact Guard/Touching assist Assistive device: Walker-rolling   Walk 50 feet activity   Assist Walk 50 feet with 2 turns activity did not occur: Safety/medical concerns  Assist level: Contact Guard/Touching assist Assistive device: Walker-rolling    Walk 150 feet activity   Assist Walk 150 feet activity did not occur: Safety/medical concerns         Walk 10 feet on uneven surface  activity   Assist Walk 10 feet on uneven surfaces activity did not occur: Safety/medical concerns         Wheelchair     Assist Is the patient using a wheelchair?: Yes Type of Wheelchair: Manual    Wheelchair assist level: Independent Max wheelchair distance: 1548f   Wheelchair 50 feet with 2 turns activity    Assist    Wheelchair 50 feet with 2 turns activity did not occur: Safety/medical concerns   Assist Level: Independent   Wheelchair 150 feet activity     Assist  Wheelchair 150 feet activity did not occur: Safety/medical concerns   Assist Level: Independent   Blood pressure 119/75, pulse 72, temperature 98.7 F (37.1 C), temperature source Oral, resp. rate 18, height '6\' 1"'$  (1.854 m), weight 81.2 kg, SpO2 100 %.  Medical Problem List and Plan: 1. Functional deficits secondary to left below the knee amputation, osteoarthritis with chronic pain.             -patient may not shower             -ELOS/Goals: 7-10 days  Continue CIR  -PRAFO has been ordered on right  Messaged April to schedule TC with EuZella Ballr myself in one week as patient would like [place] take over his pain management. Discussed this with patient.  2.  Antithrombotics: -DVT/anticoagulation:  Pharmaceutical: Eliquis             -antiplatelet therapy: Plavix 3. Pain Management (acute post-op, chronic chest/back): Fentanyl patch.  Oxycodone, Robaxin, Tylenol as needed  Discontinue cymbalta in case contributing to confusion             - continue  Fentanyl patch 75 mcg/hr OP medication, per PDMP last filled 06/06/22 by Dr. FaFlorencia [facility] (15 days supply)                       - Restarted inpatient 10/6                       - Of note, documented insurance denial for further patches 8/21 d/t no pain contract  Increase gabapentin to '200mg'$  TID  Discontinue amitriptyline given confusion  Added amitriptyline and Cymbalta to his allergy list  -10/16 Increase Robaxin dose to '750mg'$  4. Insomnia: discontiue amitriptyline given confusion 5. Neuropsych/cognition: This patient is capable of making decisions on his own behalf. 6. Incisional wound: vac removed, size 3X shrinker ordered 7. Fluids/Electrolytes/Nutrition: Routine I's and O's             -continue carb modified diet             -continue vitamin C, zinc, Juven supplements 8: CAD/ Paroxysmal A fib: left heart cath 05/2022; stents to LAD             -Restarted on Eliquis 10/5             -continue Toprol XL 25 mg TID             - Intermittent tachycardia likely d/t poor pain control    9: T2DM c/b peripheral neuropathy: CBGs q AC and q HS             -continue Levemir 8 units daily             -continue Farxiga 10 mg daily  -placed nursing order to not provide any juice or soda             -SSI  D/c protein supplements  Magnesium gluconate '250mg'$  started HS CBG (last 3)  Recent Labs    08/03/22 1713 08/03/22 2103 08/04/22 0737  GLUCAP 144* 183* 187*     10: Hyperlipidemia: continue Zetia, Lipitor 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 Given level 6.2, will hold Eliquis today, given 1U PRBCs and repeat CBC tomorrow and restart Eliquis if improved  -10/16 HGB improved to 7.2 this AM 14: Hyponatremia: DC IVF at admission. Is/Os negative; follow-up BMP  -10/7 Improved to 132 ,    Monitor weekly 15. Right foot plantarflexion tone: Order PRAFO on IPR admission.  16. Low albumin  -educated regarding choosing low added sugar/ high protein meals 17. History of chronic costochondritis/chest wall arthritis  on fentanyl 75  -Lidocaine patch started 18. Vitamin D deficiency, level 17.42: start ergocalciferol 50,000U once per week for 7 weeks 19. Suboptimal magnesium level: start magneisum gluconate '250mg'$  HS 20. Constipation: continue senna, miralax, and magnesium  -LBM 10/15-improved 21. Increased confusion as per therapy: Head CT ordered and is stable. Resolved with cessation of amitriptyline and cymbalta 22. Chronic Costochondritis: continue fentanyl patch 23. Chronic left hip pain: aquathermia ordered 24. Bleeding and erythema at wound incision  -10/16 He was started on Doxycycline on 10/13. Appears improved today, continue doxycycline   LOS: 10 days A FACE TO FACE EVALUATION WAS PERFORMED  Jennye Boroughs 08/04/2022, 9:12 AM

## 2022-08-05 LAB — CBC
HCT: 22.1 % — ABNORMAL LOW (ref 39.0–52.0)
Hemoglobin: 7.3 g/dL — ABNORMAL LOW (ref 13.0–17.0)
MCH: 26.7 pg (ref 26.0–34.0)
MCHC: 33 g/dL (ref 30.0–36.0)
MCV: 81 fL (ref 80.0–100.0)
Platelets: 221 10*3/uL (ref 150–400)
RBC: 2.73 MIL/uL — ABNORMAL LOW (ref 4.22–5.81)
RDW: 16.3 % — ABNORMAL HIGH (ref 11.5–15.5)
WBC: 5.3 10*3/uL (ref 4.0–10.5)
nRBC: 0 % (ref 0.0–0.2)

## 2022-08-05 LAB — GLUCOSE, CAPILLARY
Glucose-Capillary: 102 mg/dL — ABNORMAL HIGH (ref 70–99)
Glucose-Capillary: 131 mg/dL — ABNORMAL HIGH (ref 70–99)
Glucose-Capillary: 119 mg/dL — ABNORMAL HIGH (ref 70–99)
Glucose-Capillary: 166 mg/dL — ABNORMAL HIGH (ref 70–99)

## 2022-08-05 NOTE — Progress Notes (Signed)
Occupational Therapy Session Note  Patient Details  Name: Robert Burnett MRN: [id number] Date of Birth: 10-12-1961  Today's Date: 08/05/2022 OT Individual Time: 5697-9480 OT Individual Time Calculation (min): 70 min    Short Term Goals: Week 2:  OT Short Term Goal 1 (Week 2): STG = LTG 2/2 ELOS  Skilled Therapeutic Interventions/Progress Updates:  Skilled OT intervention completed with focus on d/c planning, limb care, functional transfers and BUE endurance needed to promote independence in BADLs. Pt received upright in bed, unrated pain in residual limb, pre-medicated. Therapist offered rest breaks and repositioning for pain reduction throughout.  Therapist and other staff members have repeatedly asked pt to call and have made personal attempts to call pt's nephew to coordinate/provide family education for supervision needs however both parties have either not returned calls or not followed up. Care team notified- with therapist re-enforcing with pt his intermittent supervision need, however plan to d/c at a wheel chair level due to limited education provided with family. Discussed plan B people that could assist him at home if nephew falls through. Pt agreeable and receptive.   Completed bed mobility to EOB with supervision. Upon transition, residual limb was noted to be soiled with fresh red blood. Attempted to check in with nursing about status of the wound care as pt has had slow bleed over the past several days, however was unable to locate. Doffed soiled gauze and wrapped fresh guaze with even pressure with amputation method until nursing notified at conclusion of session. Donned limb guard with mod I, then supervision squat pivot from EOB > w/c. Donned shoe with mod I. Self-propelled in w/c <> gym with mod I.  Seated at [place] pt completed the following intervals: Level 5, 5 mins, forwards Level 8, 5 mins, backwards  Back in room, pt transferred at supervision level for squat pivot,  and mod I bed mobility and removal of shrinker. Pt remained upright in bed, with bed alarm on, nurse made aware of bandage status and pain meds request, with all other needs met at end of session.   Therapy Documentation Precautions:  Precautions Precautions: Fall Precaution Comments: wound vac; watch HR, constant spinal and sternal OA pain Restrictions Weight Bearing Restrictions: No LLE Weight Bearing: Non weight bearing Other Position/Activity Restrictions: limb guard when OOB - off when in bed    Therapy/Group: Individual Therapy  [name], MS, OTR/L  08/05/2022, 3:33 PM

## 2022-08-05 NOTE — Progress Notes (Signed)
Patient ID: Robert Burnett, male   DOB: 08-Aug-1961, 61 y.o.   MRN: [id number]  Have ordered wheelchair and 3 in 1 from [employer] to be delivered to pt's room prior to discharge tomorrow. Have attempted to contact gary-nephew but without success and pt has also. Pt reports nephew suppose to call him back regarding family education prior to discharge Thursday.

## 2022-08-05 NOTE — Progress Notes (Signed)
PROGRESS NOTE   Subjective/Complaints: Mr. Reinig feels some pain in residual limb today, but otherwise he is feeling better.  Discussed repeating CBC today He is working with OT  ROS: +residual limb and phantom limb pain, +insomnia, +increased confusion since starting cymbalta- improved since stopping, +chronic costochondritis, +chronic fatigue- stable at this time. Constipation improved after BM yesterday.   Review of Systems  Constitutional:  Negative for chills and fever.  HENT:  Negative for congestion.   Respiratory:  Negative for shortness of breath.   Cardiovascular:  Negative for chest pain.       Chest wall soreness  Gastrointestinal:  Negative for abdominal pain, diarrhea, nausea and vomiting.  Musculoskeletal:  Positive for back pain and joint pain.     Objective:   No results found. Recent Labs    08/03/22 0545 08/04/22 0508  WBC 4.9 6.0  HGB 6.2* 7.2*  HCT 19.9* 21.8*  PLT 199 209    Recent Labs    08/04/22 0508  NA 138  K 4.0  CL 99  CO2 28  GLUCOSE 114*  BUN 25*  CREATININE 1.12  CALCIUM 9.2     Intake/Output Summary (Last 24 hours) at 08/05/2022 1018 Last data filed at 08/05/2022 0856 Gross per 24 hour  Intake 240 ml  Output 2700 ml  Net -2460 ml         Physical Exam: Vital Signs Blood pressure (!) 146/69, pulse 73, temperature 98.1 F (36.7 C), temperature source Oral, resp. rate 16, height '6\' 1"'$  (1.854 m), weight 81.2 kg, SpO2 100 %.    General: Alert and oriented x 3, No apparent distress, BMI 23.62 HEENT: Head is normocephalic, atraumatic, PERRLA, EOMI, sclera anicteric, oral mucosa pink and moist Neck: Supple without JVD or lymphadenopathy Heart: Bradycardia Chest: CTA bilaterally without wheezes, rales, or rhonchi; no distress Chest wall and sternal tenderness Abdomen: Soft, non-tender, non-distended, bowel sounds positive. Extremities: No clubbing, cyanosis, or  edema. Pulses are 2+ Psych: Pt's affect is appropriate. Pt is cooperative Skin: warm and dry, limb guard in place           Musculoskeletal:     Cervical back: Neck supple.     Comments:      LLE - 3+/5 hip flexion, limited by pain     RLE, BL UE - 5/5 throughout     + R foot resting plantarflexion tone Incision site with less bleeding and less erythema, staples in place Neurological:     Sensation: Decreased to light touch over right foot, ankle    Mental Status: He is alert and oriented to person, place, and time.  Mild twitching has resolved    Assessment/Plan: 1. Functional deficits which require 3+ hours per day of interdisciplinary therapy in a comprehensive inpatient rehab setting. Physiatrist is providing close team supervision and 24 hour management of active medical problems listed below. Physiatrist and rehab team continue to assess barriers to discharge/monitor patient progress toward functional and medical goals  Care Tool:  Bathing    Body parts bathed by patient: Right arm, Left arm, Chest, Abdomen, Front perineal area, Buttocks, Left upper leg, Right upper leg, Right lower leg, Face  Body parts n/a: Left lower leg   Bathing assist Assist Level: Supervision/Verbal cueing     Upper Body Dressing/Undressing Upper body dressing   What is the patient wearing?: Pull over shirt    Upper body assist Assist Level: Set up assist    Lower Body Dressing/Undressing Lower body dressing      What is the patient wearing?: Pants     Lower body assist Assist for lower body dressing: Supervision/Verbal cueing     Toileting Toileting    Toileting assist Assist for toileting: Set up assist Assistive Device Comment: urinal   Transfers Chair/bed transfer  Transfers assist     Chair/bed transfer assist level: Supervision/Verbal cueing     Locomotion Ambulation   Ambulation assist      Assist level: Contact Guard/Touching assist Assistive  device: Walker-rolling Max distance: 164f   Walk 10 feet activity   Assist     Assist level: Contact Guard/Touching assist Assistive device: Walker-rolling   Walk 50 feet activity   Assist Walk 50 feet with 2 turns activity did not occur: Safety/medical concerns  Assist level: Contact Guard/Touching assist Assistive device: Walker-rolling    Walk 150 feet activity   Assist Walk 150 feet activity did not occur: Safety/medical concerns  Assist level: Contact Guard/Touching assist Assistive device: Walker-rolling    Walk 10 feet on uneven surface  activity   Assist Walk 10 feet on uneven surfaces activity did not occur: Safety/medical concerns         Wheelchair     Assist Is the patient using a wheelchair?: Yes Type of Wheelchair: Manual    Wheelchair assist level: Independent Max wheelchair distance: 1534f   Wheelchair 50 feet with 2 turns activity    Assist    Wheelchair 50 feet with 2 turns activity did not occur: Safety/medical concerns   Assist Level: Independent   Wheelchair 150 feet activity     Assist  Wheelchair 150 feet activity did not occur: Safety/medical concerns   Assist Level: Independent   Blood pressure (!) 146/69, pulse 73, temperature 98.1 F (36.7 C), temperature source Oral, resp. rate 16, height '6\' 1"'$  (1.854 m), weight 81.2 kg, SpO2 100 %.  Medical Problem List and Plan: 1. Functional deficits secondary to left below the knee amputation, osteoarthritis with chronic pain.             -patient may not shower             -ELOS/Goals: 7-10 days  Continue CIR  -PRAFO has been ordered on right  Messaged April to schedule TC with EuZella Ballr myself in one week as patient would like [facility] take over his pain management. Discussed this with patient.  2.  Antithrombotics: -DVT/anticoagulation:  Pharmaceutical: Eliquis             -antiplatelet therapy: Plavix 3. Pain Management (acute post-op, chronic chest/back):  Fentanyl patch.  Oxycodone, Robaxin, Tylenol as needed. Patient is concerned about receiving Fentanyl patch- [facility] some before he leaves.   Discontinue cymbalta in case contributing to confusion             - continue Fentanyl patch 75 mcg/hr OP medication, per PDMP last filled 06/06/22 by Dr. FaFlorencia [facility] (15 days supply)                       - Restarted inpatient  10/6                       - Of note, documented insurance denial for further patches 8/21 d/t no pain contract  Increase gabapentin to '200mg'$  TID   Discontinue amitriptyline given confusion  Added amitriptyline and Cymbalta to his allergy list  -10/16 Increase Robaxin dose to '750mg'$  4. Insomnia: discontiue amitriptyline given confusion 5. Neuropsych/cognition: This patient is capable of making decisions on his own behalf. 6. Incisional wound: vac removed, size 3X shrinker ordered 7. Fluids/Electrolytes/Nutrition: Routine I's and O's             -continue carb modified diet             -continue vitamin C, zinc, Juven supplements 8: CAD/ Paroxysmal A fib: left heart cath 05/2022; stents to LAD             -Restarted on Eliquis 10/5, held due to bleeding             -continue Toprol XL 25 mg TID             - Intermittent tachycardia likely d/t poor pain control    9: T2DM c/b peripheral neuropathy: CBGs q AC and q HS             -continue Levemir 8 units daily             -continue Farxiga 10 mg daily  -placed nursing order to not provide any juice or soda             -SSI  D/c protein supplements  Magnesium gluconate '250mg'$  started HS CBG (last 3)  Recent Labs    08/04/22 1647 08/04/22 2143 08/05/22 0617  GLUCAP 138* 188* 102*     10: Hyperlipidemia: continue Zetia, Lipitor 11: Urothelial cancer s/p nephrectomy 12: Tobacco use: cessation counseling; continue nicotine patches 13: Anemia>chronic disease,acute blood loss: likely secondary  to increased bleeding from residual limb which has decreased. Given level 6.2, will hold Eliquis today, given 1U PRBCs and repeat CBC tomorrow and restart Eliquis if improved  -10/16 HGB improved to 7.2 this AM  10/17: repeat CBC 14: Hyponatremia: DC IVF at admission. Is/Os negative; follow-up BMP  -10/7 Improved to 132 ,   Monitor weekly 15. Right foot plantarflexion tone: Order PRAFO on IPR admission.  16. Low albumin  -educated regarding choosing low added sugar/ high protein meals 17. History of chronic costochondritis/chest wall arthritis  on fentanyl 75  -Lidocaine patch started 18. Vitamin D deficiency, level 17.42: start ergocalciferol 50,000U once per week for 7 weeks 19. Suboptimal magnesium level: start magneisum gluconate '250mg'$  HS 20. Constipation: continue senna, miralax, and magnesium  -LBM 10/15-improved 21. Increased confusion as per therapy: Head CT ordered and is stable. Resolved with cessation of amitriptyline and cymbalta 22. Chronic Costochondritis: continue fentanyl patch 23. Chronic left hip pain: aquathermia ordered 24. Bleeding and erythema at wound incision  -10/16 He was started on Doxycycline on 10/13. Appears improved today, continue doxycycline   LOS: 11 days A FACE TO FACE EVALUATION WAS PERFORMED  Lafonda Patron P Atiyah Bauer 08/05/2022, 10:18 AM

## 2022-08-05 NOTE — Progress Notes (Signed)
Physical Therapy Session Note  Patient Details  Name: Robert Burnett MRN: [id number] Date of Birth: 04-18-1961  Today's Date: 08/05/2022 PT Individual Time: 1638-4665 PT Individual Time Calculation (min): 63 min   Short Term Goals: Week 1:  PT Short Term Goal 1 (Week 1): STG = LTG d/t ELOS  Skilled Therapeutic Interventions/Progress Updates:    pt received in bed and agreeable to therapy. Pt reports 4/10 pain at rest that elevated slightly with mobility. Pt recd pain medication from nsg during session. MD also in/out during session. Supine>sit with supervision. Pt then performed ambulatory transfer with RW and CGA to w/c. Pt propelled w/c with BUE throughout session with supervision, including management of leg rests. Discussed home set up and pt's concern about limited help. Assisted pt in writing down reminders of things to take care of before d/c. Pt performed car transfer with CGA at similar height to pt's vehicle. Pt then performed obstacle course requiring hopping laterally and backwards to prepare for navigating tight environments at home. Pt then retrieved horse shoes from various heights around gym from knee to mid chest height for functional reaching and mobility. Pt then practiced retrieving horse shoes from floor, first in front of chair for safety and then in middle of floor with CGA and RW. Pt returned to room and therapist doffed shrinker and ace wrap to replace with smaller shrinker, which had arrived during session. Therapist assisted for time and and LPN completed dressing. Pt then performed CGA squat pivot to bed and returned to supine with supervision. was left with all needs in reach and alarm active.   Therapy Documentation Precautions:  Precautions Precautions: Fall Precaution Comments: wound vac; watch HR, constant spinal and sternal OA pain Restrictions Weight Bearing Restrictions: No LLE Weight Bearing: Non weight bearing Other Position/Activity Restrictions: limb  guard when OOB - off when in bed General:       Therapy/Group: Individual Therapy  Mickel Fuchs 08/05/2022, 12:50 PM

## 2022-08-05 NOTE — Progress Notes (Signed)
Physical Therapy Session Note  Patient Details  Name: Robert Burnett MRN: [id number] Date of Birth: [birth year]  Today's Date: 08/05/2022 PT Individual Time: 1102-1200 PT Individual Time Calculation (min): 58 min   Short Term Goals: Week 2:  PT Short Term Goal 1 (Week 2): STG=LTG due to extended LOS  Skilled Therapeutic Interventions/Progress Updates:     Pt received supine in bed and agrees to therapy. Reports pain in L lower extremity. Number not provided. PT provides rest breaks and repositioning to manage pain. Supine to sit with bed features. Pt dons residual limb protector while seated in bed. Pt performs sit to stand and stand pivot transfer with cues for positioning. Pt self propels WC to gym with bilateral upper extremities. Pt performs squat pivot to mat with cues for sequencing and positioning. Session focused on standing balance on R lower extremity.   Pt performs sit to stand to RW with cues for hand position. Pt tasked with tossing ball into trampoline and catching rebound to work on dynamic standing balance and postural adjustments. Pt attempts x1 and reports sharp increase in L lower extremity pain, as well as having slight LOB. Pt reports that he cannot perform this activity. Activity adjusted and pt then cued to reach for clothespins with R upper extremity and pass them to L upper extremity, then hang pins on basketball net. Performed to encourage single leg stance balance without upper extremity support, as well as coordination and activity tolerance. PT provides minA at hips and cueing to engage hip abductors on R to promote stability and strengthening. Pt requires several seated rest breaks during activity.  Sit to supine with cues for positioning. Pt performs supine therex for strengthening of lower extremities, as well as working on pain tolerance due to pt reporting increase in L hip pain during standing activity. Pt performs 2x15 hip abduction and SLR with cues for optimal  body mechanics and correct performance. Pt then performs 3x10 bridges with R lower extremity, following PT demonstration.  Supine to sit with cues for positioning. Squat pivot back to [facility] with setup assistance. Pt self propels WC back to room. Stand pivot back to bed with CGA and cues for positioning. Left supine with alarm intact and all needs within reach.  Therapy Documentation Precautions:  Precautions Precautions: Fall Precaution Comments: wound vac; watch HR, constant spinal and sternal OA pain Restrictions Weight Bearing Restrictions: No LLE Weight Bearing: Non weight bearing Other Position/Activity Restrictions: limb guard when OOB - off when in bed   Therapy/Group: Individual Therapy  Breck Coons, PT, DPT 08/05/2022, 5:34 PM

## 2022-08-06 ENCOUNTER — Other Ambulatory Visit (HOSPITAL_COMMUNITY): Payer: Self-pay

## 2022-08-06 ENCOUNTER — Telehealth (HOSPITAL_COMMUNITY): Payer: Self-pay

## 2022-08-06 LAB — GLUCOSE, CAPILLARY
Glucose-Capillary: 101 mg/dL — ABNORMAL HIGH (ref 70–99)
Glucose-Capillary: 143 mg/dL — ABNORMAL HIGH (ref 70–99)
Glucose-Capillary: 176 mg/dL — ABNORMAL HIGH (ref 70–99)
Glucose-Capillary: 163 mg/dL — ABNORMAL HIGH (ref 70–99)

## 2022-08-06 MED ORDER — L-METHYLFOLATE-B6-B12 3-35-2 MG PO TABS
1.0000 | ORAL_TABLET | Freq: Every day | ORAL | 0 refills | Status: DC
  Filled 2022-08-06: qty 30, 30d supply, fill #0

## 2022-08-06 MED ORDER — LIDOCAINE 5 % EX PTCH
2.0000 | MEDICATED_PATCH | CUTANEOUS | 0 refills | Status: DC
  Filled 2022-08-06: qty 30, 15d supply, fill #0

## 2022-08-06 MED ORDER — ASCORBIC ACID 1000 MG PO TABS
1000.0000 mg | ORAL_TABLET | Freq: Every day | ORAL | 0 refills | Status: DC
  Filled 2022-08-06: qty 30, 30d supply, fill #0

## 2022-08-06 MED ORDER — CLOPIDOGREL BISULFATE 75 MG PO TABS
75.0000 mg | ORAL_TABLET | Freq: Every day | ORAL | 0 refills | Status: DC
  Filled 2022-08-06: qty 30, 30d supply, fill #0

## 2022-08-06 MED ORDER — OXYCODONE HCL 5 MG PO TABS
5.0000 mg | ORAL_TABLET | ORAL | 0 refills | Status: DC | PRN
  Filled 2022-08-06: qty 30, 3d supply, fill #0

## 2022-08-06 MED ORDER — APIXABAN 5 MG PO TABS
5.0000 mg | ORAL_TABLET | Freq: Two times a day (BID) | ORAL | 0 refills | Status: DC
  Filled 2022-08-06: qty 60, 30d supply, fill #0

## 2022-08-06 MED ORDER — VITAMIN D (ERGOCALCIFEROL) 1.25 MG (50000 UNIT) PO CAPS
50000.0000 [IU] | ORAL_CAPSULE | ORAL | 0 refills | Status: DC
  Filled 2022-08-06: qty 5, 30d supply, fill #0

## 2022-08-06 MED ORDER — GABAPENTIN 100 MG PO CAPS
200.0000 mg | ORAL_CAPSULE | Freq: Three times a day (TID) | ORAL | 0 refills | Status: DC
  Filled 2022-08-06: qty 180, 30d supply, fill #0

## 2022-08-06 MED ORDER — LOSARTAN POTASSIUM 50 MG PO TABS
50.0000 mg | ORAL_TABLET | Freq: Every day | ORAL | 0 refills | Status: DC
  Filled 2022-08-06: qty 30, 30d supply, fill #0

## 2022-08-06 MED ORDER — FENTANYL 75 MCG/HR TD PT72
1.0000 | MEDICATED_PATCH | TRANSDERMAL | 0 refills | Status: DC
  Filled 2022-08-06: qty 5, 15d supply, fill #0

## 2022-08-06 MED ORDER — FINASTERIDE 5 MG PO TABS
5.0000 mg | ORAL_TABLET | Freq: Every day | ORAL | 0 refills | Status: DC
  Filled 2022-08-06: qty 30, 30d supply, fill #0

## 2022-08-06 MED ORDER — APIXABAN 5 MG PO TABS
5.0000 mg | ORAL_TABLET | Freq: Two times a day (BID) | ORAL | Status: DC
  Administered 2022-08-06 – 2022-08-07 (×3): 5 mg via ORAL
  Filled 2022-08-06 (×3): qty 1

## 2022-08-06 MED ORDER — L-METHYLFOLATE-B6-B12 3-35-2 MG PO TABS
1.0000 | ORAL_TABLET | Freq: Every day | ORAL | Status: DC
  Administered 2022-08-06 – 2022-08-07 (×2): 1 via ORAL
  Filled 2022-08-06 (×3): qty 1

## 2022-08-06 MED ORDER — POLYETHYLENE GLYCOL 3350 17 G PO PACK: 17.0000 g | PACK | Freq: Every day | ORAL | 0 refills | Status: DC

## 2022-08-06 MED ORDER — NICOTINE 7 MG/24HR TD PT24
MEDICATED_PATCH | TRANSDERMAL | 0 refills | Status: DC
  Filled 2022-08-06: qty 28, fill #0

## 2022-08-06 MED ORDER — APIXABAN 5 MG PO TABS: 5.0000 mg | ORAL_TABLET | Freq: Two times a day (BID) | ORAL | 0 refills | Status: DC

## 2022-08-06 MED ORDER — METHOCARBAMOL 750 MG PO TABS
750.0000 mg | ORAL_TABLET | Freq: Three times a day (TID) | ORAL | 0 refills | Status: DC | PRN
  Filled 2022-08-06: qty 60, 20d supply, fill #0

## 2022-08-06 MED ORDER — DAPAGLIFLOZIN PROPANEDIOL 10 MG PO TABS
10.0000 mg | ORAL_TABLET | Freq: Every day | ORAL | 0 refills | Status: DC
  Filled 2022-08-06: qty 30, 30d supply, fill #0

## 2022-08-06 MED ORDER — FENTANYL 75 MCG/HR TD PT72: 1.0000 | MEDICATED_PATCH | TRANSDERMAL | 0 refills | Status: DC

## 2022-08-06 MED ORDER — METOPROLOL SUCCINATE ER 25 MG PO TB24
25.0000 mg | ORAL_TABLET | Freq: Three times a day (TID) | ORAL | 0 refills | Status: DC
  Filled 2022-08-06: qty 90, 30d supply, fill #0

## 2022-08-06 MED ORDER — PANTOPRAZOLE SODIUM 40 MG PO TBEC
40.0000 mg | DELAYED_RELEASE_TABLET | Freq: Every day | ORAL | 0 refills | Status: DC
  Filled 2022-08-06: qty 30, 30d supply, fill #0

## 2022-08-06 MED ORDER — ATORVASTATIN CALCIUM 80 MG PO TABS
80.0000 mg | ORAL_TABLET | Freq: Every day | ORAL | 0 refills | Status: DC
  Filled 2022-08-06: qty 30, 30d supply, fill #0

## 2022-08-06 MED ORDER — MAGNESIUM OXIDE 400 MG PO TABS
250.0000 mg | ORAL_TABLET | Freq: Every day | ORAL | 0 refills | Status: DC
  Filled 2022-08-06: qty 30, 60d supply, fill #0

## 2022-08-06 MED ORDER — DICLOFENAC SODIUM 1 % EX GEL
2.0000 g | Freq: Four times a day (QID) | CUTANEOUS | 0 refills | Status: AC | PRN
  Filled 2022-08-06: qty 100, 25d supply, fill #0

## 2022-08-06 MED ORDER — FUROSEMIDE 40 MG PO TABS
40.0000 mg | ORAL_TABLET | Freq: Every day | ORAL | 0 refills | Status: DC | PRN
  Filled 2022-08-06: qty 10, 10d supply, fill #0

## 2022-08-06 MED ORDER — ACETAMINOPHEN 325 MG PO TABS: 325.0000 mg | ORAL_TABLET | ORAL | Status: DC | PRN

## 2022-08-06 NOTE — Progress Notes (Signed)
Patient ID: Robert Burnett, male   DOB: 04/28/1961, 61 y.o.   MRN: [id number]  Team Conference Report to Patient/Family  Team Conference discussion was reviewed with the patient and caregiver, including goals, any changes in plan of care and target discharge date.  Patient and caregiver express understanding and are in agreement.  The patient has a target discharge date of 08/07/22.  SW met with patient and provided team conference updates.  Sw informed patient of not meeting deductible and needing to pay $632.34 before receiving DME. Patient does not want to pay high deductible. Sw discussed with patient potentially ordering DME offline (Therapist will discuss with patient cost and where to order). If patient does not want to order DME offline SW has discussed with patient the potential of discharging then ordering DME to ensure deductible is met for DME and medications. Physician and PA aware.   PA will write scripts for patient to take to his pharmacy once deductible is met to lower cost.   Christina J Baskerville 08/06/2022, 1:53 PM  

## 2022-08-06 NOTE — Progress Notes (Signed)
Occupational Therapy Discharge Summary  Patient Details  Name: Robert Burnett MRN: [id number] Date of Birth: 1961/01/12  Date of Discharge from [place] 18, 2023   Patient has met 6 of 10 long term goals due to improved activity tolerance, improved balance, ability to compensate for deficits, and improved coordination.  Patient to discharge at overall intermittent Supervision to mod I level.  Patient's nephew works during the day but is available in the evenings with plan for friend who is a retired Marine scientist to supervise pt while he is out of the home. Pt's nephew has attended 1 family education with OT regarding pt's CLOF, safety recommendations and has verbalized his readiness to assist pt with needs at discharge.  to provide the necessary physical and cognitive assistance at discharge.    Reasons goals not met: Dynamic balance, sit > stand, toileting and toilet transfer goals not met at mod I level vs supervision due to cognitive deficits, poor balance strategies and safety implications with cues needed  Recommendation:  Patient will benefit from ongoing skilled OT services in home health setting to continue to advance functional skills in the area of BADL and iADL.  Equipment: 3 in 1 BSC  Reasons for discharge: treatment goals met  Patient/family agrees with progress made and goals achieved: Yes  OT Discharge Precautions/Restrictions  Precautions Precautions: Fall Precaution Comments: limb guard when OOB Restrictions Weight Bearing Restrictions: Yes LLE Weight Bearing: Non weight bearing ADL ADL Eating: Independent Where Assessed-Eating: Bed level Grooming: Independent Where Assessed-Grooming: Wheelchair Upper Body Bathing: Modified independent Where Assessed-Upper Body Bathing: Edge of bed Lower Body Bathing: Modified independent Where Assessed-Lower Body Bathing: Edge of bed Upper Body Dressing: Independent Where Assessed-Upper Body Dressing: Edge of bed Lower  Body Dressing: Modified independent Where Assessed-Lower Body Dressing: Edge of bed Toileting: Supervision/safety Where Assessed-Toileting: Bedside Commode, Control and instrumentation engineer Transfer: Close supervision Toilet Transfer Method: Counselling psychologist: Radiographer, therapeutic: Modified independent Clinical cytogeneticist Method: Engineer, production: Facilities manager: Curator Method: Heritage manager: Radio broadcast assistant Vision Baseline Vision/History: 1 Wears glasses (reading) Patient Visual Report: No change from baseline Vision Assessment?: No apparent visual deficits Perception  Perception: Within Functional Limits Praxis Praxis: Intact Cognition Cognition Overall Cognitive Status: Within Functional Limits for tasks assessed Arousal/Alertness: Awake/alert Orientation Level: Person;Place;Situation Person: Oriented Place: Oriented Situation: Oriented Memory: Impaired Awareness: Impaired Problem Solving: Appears intact Safety/Judgment: Appears intact Brief Interview for Mental Status (BIMS) Repetition of Three Words (First Attempt): 3 Temporal Orientation: Year: Correct Temporal Orientation: Month: Accurate within 5 days Temporal Orientation: Day: Correct Recall: "Sock": Yes, no cue required Recall: "Blue": Yes, no cue required Recall: "Bed": Yes, no cue required BIMS Summary Score: 15 Sensation Sensation Light Touch: Appears Intact Hot/Cold: Appears Intact Proprioception: Appears Intact Additional Comments: pt reports burning in L residual limb described as "hot wire cutting off circulation" Coordination Gross Motor Movements are Fluid and Coordinated: Yes Fine Motor Movements are Fluid and Coordinated: Yes Coordination and Movement Description: pain, generalized weakness/deconditioning, and altered balance strategies 2/2 L BKA Finger Nose Finger Test: [facility]  bilaterally Motor  Motor Motor: Within Functional Limits Motor - Skilled Clinical Observations: pain, generalized weakness/deconditioning, and altered balance strategies 2/2 L BKA Mobility  Bed Mobility Bed Mobility: Rolling Right;Rolling Left;Sit to Supine;Supine to Sit Rolling Right: Independent with assistive device Rolling Left: Independent with assistive device Supine to Sit: Independent with assistive device Sit to Supine: Independent with assistive device Transfers Sit  to Stand: Supervision/Verbal cueing Stand to Sit: Supervision/Verbal cueing  Trunk/Postural Assessment  Cervical Assessment Cervical Assessment: Exceptions to [facility] (forward head) Thoracic Assessment Thoracic Assessment: Exceptions to [facility] (thoracic rounding) Lumbar Assessment Lumbar Assessment: Exceptions to [facility] (posterior pelvic tilt) Postural Control Postural Control: Within Functional Limits  Balance Balance Balance Assessed: Yes Static Sitting Balance Static Sitting - Balance Support: Feet supported;No upper extremity supported Static Sitting - Level of Assistance: 7: Independent Dynamic Sitting Balance Dynamic Sitting - Balance Support: Feet supported;No upper extremity supported Dynamic Sitting - Level of Assistance: 7: Independent Static Standing Balance Static Standing - Balance Support: Bilateral upper extremity supported;During functional activity (RW) Static Standing - Level of Assistance: 5: Stand by assistance (supervision) Dynamic Standing Balance Dynamic Standing - Balance Support: Bilateral upper extremity supported;During functional activity (RW) Dynamic Standing - Level of Assistance: 5: Stand by assistance (supervision) Dynamic Standing - Comments: with transfers and gait Extremity/Trunk Assessment RUE Assessment RUE Assessment: Within Functional Limits LUE Assessment LUE Assessment: Within Functional Limits   Puneet Masoner E Barrett Goldie, MS, OTR/L  08/06/2022, 4:21 PM

## 2022-08-06 NOTE — Progress Notes (Addendum)
PROGRESS NOTE   Subjective/Complaints: No new complaints this morning Continues to have pain in his residual limb but he otherwise feels well. Increased erythema of residual limb, requested that Dr. Jess Barters team evaluate  ROS: +residual limb and phantom limb pain- increased erythema of residual limb, +insomnia, +increased confusion since starting cymbalta- improved since stopping, +chronic costochondritis, +chronic fatigue- stable at this time. Constipation improved after BM yesterday.   Review of Systems  Constitutional:  Negative for chills and fever.  HENT:  Negative for congestion.   Respiratory:  Negative for shortness of breath.   Cardiovascular:  Negative for chest pain.       Chest wall soreness  Gastrointestinal:  Negative for abdominal pain, diarrhea, nausea and vomiting.  Musculoskeletal:  Positive for back pain and joint pain.     Objective:   No results found. Recent Labs    08/04/22 0508 08/05/22 1028  WBC 6.0 5.3  HGB 7.2* 7.3*  HCT 21.8* 22.1*  PLT 209 221    Recent Labs    08/04/22 0508  NA 138  K 4.0  CL 99  CO2 28  GLUCOSE 114*  BUN 25*  CREATININE 1.12  CALCIUM 9.2     Intake/Output Summary (Last 24 hours) at 08/06/2022 1039 Last data filed at 08/06/2022 0930 Gross per 24 hour  Intake 716 ml  Output 3425 ml  Net -2709 ml         Physical Exam: Vital Signs Blood pressure (!) 145/68, pulse (!) 58, temperature 98.1 F (36.7 C), temperature source Oral, resp. rate 16, height '6\' 1"'$  (1.854 m), weight 81.2 kg, SpO2 100 %.    General: Alert and oriented x 3, No apparent distress, BMI 23.62 HEENT: Head is normocephalic, atraumatic, PERRLA, EOMI, sclera anicteric, oral mucosa pink and moist Neck: Supple without JVD or lymphadenopathy Heart: Bradycardia Chest: CTA bilaterally without wheezes, rales, or rhonchi; no distress Chest wall and sternal tenderness Abdomen: Soft,  non-tender, non-distended, bowel sounds positive. Extremities: No clubbing, cyanosis, or edema. Pulses are 2+ Psych: Pt's affect is appropriate. Pt is cooperative Skin: warm and dry, limb guard in place           Musculoskeletal:     Cervical back: Neck supple.     Comments:      LLE - 3+/5 hip flexion, limited by pain     RLE, BL UE - 5/5 throughout     + R foot resting plantarflexion tone Incision site with less bleeding but increased erythema, staples in place Neurological:     Sensation: Decreased to light touch over right foot, ankle    Mental Status: He is alert and oriented to person, place, and time.  Mild twitching has resolved    Assessment/Plan: 1. Functional deficits which require 3+ hours per day of interdisciplinary therapy in a comprehensive inpatient rehab setting. Physiatrist is providing close team supervision and 24 hour management of active medical problems listed below. Physiatrist and rehab team continue to assess barriers to discharge/monitor patient progress toward functional and medical goals  Care Tool:  Bathing    Body parts bathed by patient: Right arm, Left arm, Chest, Abdomen, Front perineal area, Buttocks, Left upper  leg, Right upper leg, Right lower leg, Face     Body parts n/a: Left lower leg   Bathing assist Assist Level: Supervision/Verbal cueing     Upper Body Dressing/Undressing Upper body dressing   What is the patient wearing?: Pull over shirt    Upper body assist Assist Level: Set up assist    Lower Body Dressing/Undressing Lower body dressing      What is the patient wearing?: Pants     Lower body assist Assist for lower body dressing: Supervision/Verbal cueing     Toileting Toileting    Toileting assist Assist for toileting: Set up assist Assistive Device Comment: urinal   Transfers Chair/bed transfer  Transfers assist     Chair/bed transfer assist level: Supervision/Verbal cueing      Locomotion Ambulation   Ambulation assist      Assist level: Contact Guard/Touching assist Assistive device: Walker-rolling Max distance: 131f   Walk 10 feet activity   Assist     Assist level: Contact Guard/Touching assist Assistive device: Walker-rolling   Walk 50 feet activity   Assist Walk 50 feet with 2 turns activity did not occur: Safety/medical concerns  Assist level: Contact Guard/Touching assist Assistive device: Walker-rolling    Walk 150 feet activity   Assist Walk 150 feet activity did not occur: Safety/medical concerns  Assist level: Contact Guard/Touching assist Assistive device: Walker-rolling    Walk 10 feet on uneven surface  activity   Assist Walk 10 feet on uneven surfaces activity did not occur: Safety/medical concerns         Wheelchair     Assist Is the patient using a wheelchair?: Yes Type of Wheelchair: Manual    Wheelchair assist level: Independent Max wheelchair distance: 1543f   Wheelchair 50 feet with 2 turns activity    Assist    Wheelchair 50 feet with 2 turns activity did not occur: Safety/medical concerns   Assist Level: Independent   Wheelchair 150 feet activity     Assist  Wheelchair 150 feet activity did not occur: Safety/medical concerns   Assist Level: Independent   Blood pressure (!) 145/68, pulse (!) 58, temperature 98.1 F (36.7 C), temperature source Oral, resp. rate 16, height '6\' 1"'$  (1.854 m), weight 81.2 kg, SpO2 100 %.  Medical Problem List and Plan: 1. Functional deficits secondary to left below the knee amputation, osteoarthritis with chronic pain.             -patient may not shower             -ELOS/Goals: 7-10 days  Continue CIR  -PRAFO has been ordered on right  Messaged April to schedule TC with EuZella Ballr myself in one week as patient would like [place] take over his pain management. Discussed this with patient. He is scheduled with EuZella Balln 10/26  -Interdisciplinary  Team Conference today   2.  Antithrombotics: -DVT/anticoagulation:  Pharmaceutical: Eliquis             -antiplatelet therapy: Plavix 3. Pain Management (acute post-op, chronic chest/back): Fentanyl patch.  Oxycodone, Robaxin, Tylenol as needed. Patient is concerned about receiving Fentanyl patch- asked SaKatharine Looko send medication to [facility] he can be sure he has some before he leaves.   Discontinue cymbalta in case contributing to confusion             - continue Fentanyl patch 75 mcg/hr OP medication, per PDMP [facility]  [facility]                       - Restarted inpatient 10/6                       - Of note, documented insurance denial for further patches 8/21 d/t no pain contract  Increase gabapentin to '200mg'$  TID   Discontinue amitriptyline given confusion  Added amitriptyline and Cymbalta to his allergy list  -10/16 Increase Robaxin dose to '750mg'$  4. Insomnia: discontiue amitriptyline given confusion, he is sleeping well now.  5. Neuropsych/cognition: This patient is capable of making decisions on his own behalf. 6. Incisional wound: vac removed, size 2X shrinker ordered, requested Dr. Jess Barters team evaluate given increased erythema. Started on doxycycline on 10/13  7. Fluids/Electrolytes/Nutrition: Routine I's and O's             -continue carb modified diet             -continue vitamin C, zinc, Juven supplements 8: CAD/ Paroxysmal A fib: left heart cath 05/2022; stents to LAD             -Restarted on Eliquis 10/5, held due to bleeding             -continue Toprol XL 25 mg TID             - Intermittent tachycardia likely d/t poor pain control    9: T2DM c/b peripheral neuropathy: CBGs q AC and q HS             -continue Levemir 8 units daily             -continue Farxiga 10 mg daily  -placed nursing order to not provide any juice or soda  Start Metanx daily             -SSI  D/c protein supplements  Magnesium gluconate  '250mg'$  started HS CBG (last 3)  Recent Labs    08/05/22 1632 08/05/22 2128 08/06/22 0610  GLUCAP 119* 166* 101*     10: Hyperlipidemia: continue Zetia, Lipitor 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 Hgb improved with transfusion, Eliquis restarted 14: Hyponatremia: DC IVF at admission. Is/Os negative; follow-up BMP  -10/7 Improved to 132 ,   Monitor weekly 15. Right foot plantarflexion tone: Order PRAFO on IPR admission.  16. Low albumin  -educated regarding choosing low added sugar/ high protein meals 17. History of chronic costochondritis/chest wall arthritis  on fentanyl 75  -Lidocaine patch started 18. Vitamin D deficiency, level 17.42: start ergocalciferol 50,000U once per week for 7 weeks 19. Suboptimal magnesium level: start magneisum gluconate '250mg'$  HS 20. Constipation: continue senna, miralax, and magnesium  -LBM 10/15-improved 21. Increased confusion as per therapy: Head CT ordered and is stable. Resolved with cessation of amitriptyline and cymbalta 22. Chronic Costochondritis: continue fentanyl patch 23. Chronic left hip pain: aquathermia ordered    LOS: 12 days A FACE TO FACE EVALUATION WAS PERFORMED  Robert Burnett 08/06/2022, 10:39 AM

## 2022-08-06 NOTE — Progress Notes (Signed)
Physical Therapy Session Note  Patient Details  Name: Robert Burnett MRN: [id number] Date of Birth: 1961/01/28  Today's Date: 08/06/2022 PT Individual Time: 2993-7169 PT Individual Time Calculation (min): 70 min   Short Term Goals: Week 1:  PT Short Term Goal 1 (Week 1): STG = LTG d/t ELOS Week 2:  PT Short Term Goal 1 (Week 2): STG=LTG due to extended LOS  Skilled Therapeutic Interventions/Progress Updates:  Received pt semi-reclined in bed, pt agreeable to PT treatment, and reported pain 4/10 in L residual limb (premedicated). Session with emphasis on discharge planning, limb care, functional mobility/transfers, generalized strengthening and endurance, and gait training. Pt reported his nephew will be present for PM OT session and his friend, Robert Burnett, is still working on getting ramp built.   Went through pain interference questionnaire, sensation, and MMT and noted pt bleeding through shrinker and gauze. Noted gauze and shrinker adhered to incision. Used salene to loosen material and removed soiled gauze and shrinker and pt donned clean 2XL shrinker and applied gauze over top. Increased time spent discussing placement/positioning of shrinker and management for limb when bleeding. Pt donned new small limb guard with supervision/mod I. Spoke with Robert Burnett from [employer] regarding shrinker wear and care - okay to place non-adherent pad along just incision and shrinker directly over top to prevent skin tears and pulling on staples. MD arrived for morning rounds then pt requested therapist step out while he voided in urinal.   Pt performed bed mobility with mod I and performed all transfers with RW and close supervision throughout session. Pt ambulated 118f with RW and close supervision with close WC follow - pt demonstrating excelling R foot clearance with no LOB noted. Pt then performed WC mobility 1058fusing BUE and mod I back to room. [facility]  [facility] prior to getting back in bed - NT present at bedside.   Therapy Documentation Precautions:  Precautions Precautions: Fall Precaution Comments: limb guard when OOB Restrictions Weight Bearing Restrictions: Yes LLE Weight Bearing: Non weight bearing Other Position/Activity Restrictions: limb guard when OOB - off when in bed  Therapy/Group: Individual Therapy AnAlfonse AlpersT, DPT  08/06/2022, 7:14 AM

## 2022-08-06 NOTE — Discharge Instructions (Signed)
Inpatient Rehab Discharge Instructions  ALTONIO SCHWERTNER Discharge date and time: No discharge date for patient encounter.   Activities/Precautions/ Functional Status: Activity: activity as tolerated Diet: diabetic diet Wound Care: Cleanse incision daily antibacterial soap/Dial with water Functional status:  ___ No restrictions     ___ Walk up steps independently ___ 24/7 supervision/assistance   ___ Walk up steps with assistance ___ Intermittent supervision/assistance  ___ Bathe/dress independently ___ Walk with walker     _x__ Bathe/dress with assistance ___ Walk Independently    ___ Shower independently ___ Walk with assistance    ___ Shower with assistance ___ No alcohol     ___ Return to work/school ________  Special Instructions: No driving smoking or alcohol   My questions have been answered and I understand these instructions. I will adhere to these goals and the provided educational materials after my discharge from the hospital.  Patient/Caregiver Signature _______________________________ Date __________  Clinician Signature _______________________________________ Date __________  Please bring this form and your medication list with you to all your follow-up doctor's appointments.

## 2022-08-06 NOTE — Telephone Encounter (Signed)
Patient Advocate Encounter  Prior Authorization for Eliquis '5MG'$  tablets has been approved.    PA# [id number] Key: Joziah Litter  Effective dates: 08/06/2022 through 10/05/2022      Joneen Boers, CPhT Pharmacy Patient Advocate Specialist [place] Patient Advocate Team Direct Number: [phone]  Fax: [phone]

## 2022-08-06 NOTE — Progress Notes (Signed)
Occupational Therapy Session Note  Patient Details  Name: Robert Burnett MRN: [id number] Date of Birth: [birth year]  Today's Date: 08/06/2022 OT Individual Time: 1420-1530 OT Individual Time Calculation (min): 70 min    Short Term Goals: Week 2:  OT Short Term Goal 1 (Week 2): STG = LTG 2/2 ELOS  Skilled Therapeutic Interventions/Progress Updates:  Skilled OT intervention completed with focus on d/c planning, family and DME education, functional transfers, toileting needs. Pt received upright in bed, no pain indicated except for with mobility however no number given. Pt pre-medicated, with therapist offering rest breaks and repositioning for pain relief throughout.  Pt's nephew not present at start of session for arranged family ed as previously stated. Therapist utilized start of session to provide 2 handouts to pt regarding options of BSC (standard vs drop arm) that he can purchase at home after discharge due to the situation with his deductible not being met etc and insurance not providing DME until after discharge. Discussed benefits of each kind of BSC and toileting recommendations with each including transfers etc.   Pt donned limb guard with mod I, transitioned to EOB with mod I, then sit > stand with supervision using RW, and supervision stand hop ambulatory transfer to [facility] over toilet in bathroom. Was able to manage doffing of pants down over hips with supervision. Continent of void and BM. Completed pericare seated with distant supervision, donning of pants in stance with supervision. Ambulated to w/c, then donned w/c leg rests with total A for time.  Nephew present towards end of session. Reviewed/educated on the following topics in prep for d/c: -DME including 18x18 w/c, amputee support pad, w/c cushion and BSC options and purposes for each -CLOF at supervision for stand pivot and amb transfers using RW -Discussed arrangements for supervision of pt from retired Therapist, sports friend of nephew  that plans to supervise during the day when nephew is at work -Basic limb care including signs of infection and limb guard wear, with nursing notified of the need to formally educate pt and nephew on steps to take for limb care upon d/c -Trapeze bar and hospital bed not recommended due to pt's high level of functioning (per nephews inquiry)  Pt transferred himself via squat pivot with mod I from w/c > EOB, then transitioned into bed with mod I. Pt remained upright in bed with bed alarm on and all needs in reach at end of session.   Therapy Documentation Precautions:  Precautions Precautions: Fall Precaution Comments: limb guard when OOB Restrictions Weight Bearing Restrictions: Yes LLE Weight Bearing: Non weight bearing Other Position/Activity Restrictions: limb guard when OOB - off when in bed    Therapy/Group: Individual Therapy  [name], MS, OTR/L  08/06/2022, 3:48 PM

## 2022-08-06 NOTE — Progress Notes (Signed)
Physical Therapy Session Note  Patient Details  Name: Robert Burnett MRN: [id number] Date of Birth: Feb 23, 1961  Today's Date: 08/06/2022 PT Individual Time: 1307-1350 PT Individual Time Calculation (min): 43 min   Short Term Goals: Week 1:  PT Short Term Goal 1 (Week 1): STG = LTG d/t ELOS  Skilled Therapeutic Interventions/Progress Updates: Pt presented in bed agreeable to therapy. Pt states some pain in residual limb but managed at the moment and did not rate. PTA discussed with pt if he had spoken with LSW regarding w/c. Pt verbalized LSW arrived but pt was with pharmacist at time. PTA provided information regarding as pt has not met deductible current co-pay $600. Remainder of session reviewing with pt online w/c options, vs, amputee pads, vs use of father's chair (which is not an appropriate fit per primary therapist). Pt was able to print out examples of each via online source. PTA also provided info for w/c cushion if pt chose to go independent route. Pt thankful for this information and sheets provided. Pt left in bed at end of session with bed alarm on, call bell within reach and needs met.      Therapy Documentation Precautions:  Precautions Precautions: Fall Precaution Comments: limb guard when OOB Restrictions Weight Bearing Restrictions: Yes LLE Weight Bearing: Non weight bearing Other Position/Activity Restrictions: limb guard when OOB - off when in bed General:   Vital Signs: Therapy Vitals Temp: 98.6 F (37 C) Temp Source: Oral Pulse Rate: 68 Resp: 18 BP: 121/75 Patient Position (if appropriate): Sitting Oxygen Therapy SpO2: 98 % O2 Device: Room Air Pain:   Mobility: Bed Mobility Bed Mobility: Rolling Right;Rolling Left;Sit to Supine;Supine to Sit Rolling Right: Independent with assistive device Rolling Left: Independent with assistive device Supine to Sit: Independent with assistive device Sit to Supine: Independent with assistive  device Transfers Transfers: Sit to Stand;Stand to Sit;Stand Pivot Transfers Sit to Stand: Supervision/Verbal cueing Stand to Sit: Supervision/Verbal cueing Stand Pivot Transfers: Supervision/Verbal cueing Transfer (Assistive device): Rolling walker Locomotion :    Trunk/Postural Assessment :    Balance:   Exercises:   Other Treatments:      Therapy/Group: Individual Therapy  Nikkita Adeyemi 08/06/2022, 4:06 PM

## 2022-08-06 NOTE — Progress Notes (Signed)
Physical Therapy Discharge Summary  Patient Details  Name: Robert Burnett MRN: [id number] Date of Birth: 01/12/1961  Date of Discharge from PT service:August 06, 2022  Patient has met 9 of 11 long term goals due to improved activity tolerance, improved balance, improved postural control, increased strength, increased range of motion, ability to compensate for deficits, improved awareness, and improved coordination. Patient to discharge at a wheelchair level Supervision. Pt lives with nephew who works during the day but is available in the evenings but plans on taking 2 weeks off work upon D/C. Pt reports also having good friend, Robert Burnett, who is able to provide occasional assist.   Reasons goals not met: Pt did not meet furniture goal of mod I as pt continues to require supervision for safety due to decreased balance strategies. Pt did not meet stair goal of 2 steps with LRAD and CGA as pt currently requires min A due to altered balance strategies 2/2 L BKA.   Recommendation:  Patient will benefit from ongoing skilled PT services in home health setting to continue to advance safe functional mobility, address ongoing impairments in transfers, generalized strengthening and endurance, dynamic standing balance/coordination, gait training, limb loss education, and to minimize fall risk.  Equipment: 18x16 manual WC with L amputee support pad and 2 standard legrests, pt already has RW at home  Reasons for discharge: treatment goals met and discharge from hospital  Patient/family agrees with progress made and goals achieved: Yes  PT Discharge Precautions/Restrictions Precautions Precautions: Fall Precaution Comments: limb guard when OOB Restrictions Weight Bearing Restrictions: Yes LLE Weight Bearing: Non weight bearing Pain Interference Pain Interference Pain Effect on Sleep: 2. Occasionally Pain Interference with Therapy Activities: 3. Frequently Pain Interference with Day-to-Day  Activities: 3. Frequently Cognition Overall Cognitive Status: Within Functional Limits for tasks assessed Arousal/Alertness: Awake/alert Orientation Level: Oriented X4 Memory: Impaired Awareness: Impaired Problem Solving: Appears intact Safety/Judgment: Appears intact Sensation Sensation Light Touch: Appears Intact Proprioception: Appears Intact Additional Comments: pt reports burning in L residual limb described as "hot wire cutting off circulation" Coordination Gross Motor Movements are Fluid and Coordinated: Yes Fine Motor Movements are Fluid and Coordinated: Yes Coordination and Movement Description: pain, generalized weakness/deconditioning, and altered balance strategies 2/2 L BKA Finger Nose Finger Test: [facility] bilaterally Heel Shin Test: unable to perform on LLE due to BKA; WFL on RLE Motor  Motor Motor: Within Functional Limits Motor - Skilled Clinical Observations: pain, generalized weakness/deconditioning, and altered balance strategies 2/2 L BKA  Mobility Bed Mobility Bed Mobility: Rolling Right;Rolling Left;Sit to Supine;Supine to Sit Rolling Right: Independent with assistive device Rolling Left: Independent with assistive device Supine to Sit: Independent with assistive device Sit to Supine: Independent with assistive device Transfers Transfers: Sit to Stand;Stand to Sit;Stand Pivot Transfers Sit to Stand: Supervision/Verbal cueing Stand to Sit: Supervision/Verbal cueing Stand Pivot Transfers: Supervision/Verbal cueing Transfer (Assistive device): Rolling walker Locomotion  Gait Ambulation: Yes Gait Assistance: Supervision/Verbal cueing Gait Distance (Feet): 100 Feet Assistive device: Rolling walker Gait Assistance Details: verbal cues for energy conservation Gait Gait: Yes Gait Pattern: Impaired Gait Pattern: Step-to pattern;Decreased step length - right;Decreased stride length;Antalgic;Poor foot clearance - right Gait velocity: decreased Stairs /  Additional Locomotion Stairs: Yes Stairs Assistance: Minimal Assistance - Patient > 75% Stair Management Technique: With walker Number of Stairs: 1 Height of Stairs: 5 Ramp: Contact Guard/touching assist (RW) Curb: Minimal Assistance - Patient >75% (RW) Pick up small object from the floor assist level: Dependent - Patient 0% Wheelchair Mobility Wheelchair Mobility: Yes  Wheelchair Assistance: Independent with Camera operator: Both upper extremities Wheelchair Parts Management: Supervision/cueing Distance: 125f  Trunk/Postural Assessment  Cervical Assessment Cervical Assessment: Exceptions to [facility](forward head) Thoracic Assessment Thoracic Assessment: Exceptions to [facility](thor[facility] rounding) Lumbar Assessment Lumbar Assessment: Exceptions to [facility](posterior pelvic tilt) Postural Control Postural Control: Within Functional Limits  Balance Balance Balance Assessed: Yes Static Sitting Balance Static Sitting - Balance Support: Feet supported;No upper extremity supported Static Sitting - Level of Assistance: 7: Independent Dynamic Sitting Balance Dynamic Sitting - Balance Support: Feet supported;No upper extremity supported Dynamic Sitting - Level of Assistance: 7: Independent Static Standing Balance Static Standing - Balance Support: Bilateral upper extremity supported;During functional activity (RW) Static Standing - Level of Assistance: 5: Stand by assistance (supervision) Dynamic Standing Balance Dynamic Standing - Balance Support: Bilateral upper extremity supported;During functional activity (RW) Dynamic Standing - Level of Assistance: 5: Stand by assistance (supervision) Dynamic Standing - Comments: with transfers and gait Extremity Assessment  RLE Assessment RLE Assessment: Within Functional Limits LLE Assessment LLE Assessment: Exceptions to [facility] Strength Comments: grossly 4+/5 with hips flexion, abuduction, adduction, and extension - pt  reporting increased pain with hip extension   AAlfonse AlpersPT, DPT  08/06/2022, 7:10 AM

## 2022-08-06 NOTE — TOC Transition Note (Signed)
Sixteen (16) discharge meds in the [place] for patient until discharge.

## 2022-08-06 NOTE — Progress Notes (Signed)
Stump care educated complete with pt son. Pt demonstrated stump care and wrapping. Supplies given to patient. Pt/family in agreement and all questions answered. Sheela Stack, LPN

## 2022-08-06 NOTE — Patient Care Conference (Signed)
Inpatient RehabilitationTeam Conference and Plan of Care Update Date: 08/06/2022   Time: 11:32 AM    Patient Name: Robert Burnett      Medical Record Number: [id number]  Date of Birth: April 28, 1961 Sex: Male         Room/Bed: 4M02C/4M02C-01 Payor Info: Payor: [employer] / Plan: [employer] [place] HMO CONNECT / Product Type: *No Product type* /    Admit Date/Time:  07/25/2022  5:01 PM  Primary Diagnosis:  Osteomyelitis of left lower extremity [facility])  Hospital Problems: Principal Problem:   Osteomyelitis of left lower extremity ([place]) Active Problems:   S/P BKA (below knee amputation) unilateral, left [facility])    Expected Discharge Date: Expected Discharge Date: 08/07/22  Team Members Present: Physician leading conference: Dr. Leeroy Cha Social Worker Present: Erlene Quan, BSW Nurse Present: Dorien Chihuahua, RN PT Present: Becky Sax, PT OT Present: Jennefer Bravo, OT PPS Coordinator present : Gunnar Fusi, SLP     Current Status/Progress Goal Weekly Team Focus  Bowel/Bladder   Continent of B/B. LBM: 10/17  Assist/Offer toileting q 2-4 hours & PRN  Assess toileting needs q shift & PRN   Swallow/Nutrition/ Hydration             ADL's   Set up A UB bathing/dressing, Supervision to CGA bathing/dressing at the shower level, CGA toileting  mod I  d/c planning, family ed, pain management, functional endurance, ADL retraining   Mobility   bed mobility mod I, transfers with RW supervision, gait 167f with RW CGA, 1 5in curb with RW and min A, WC mobility 1545fmod I  supervision overall, CGA steps, and mod I WC mobility  pain management, functional mobility/transfers, generalized strengthening and endurance, amputee education, dyanamic standing balance/coordination, gait training, D/C planning, equipment management.   Communication             Safety/Cognition/ Behavioral Observations            Pain   Pt complains of L hip/leg pain rating it a 4-6/10.  Keep pain < 4/10  Assess pain q  shift & PRN   Skin   Incision to L BKA w/ staples. Discoloration/Scars on bilat. chest area  Remain free from skin breakdown; Promote healing of incision site  Assess skin q shift & PRN     Discharge Planning:  Discharging home tomorrow. Nephew coming in for family edu today   Team Discussion: Patient with erythema and bleeding at incision site. Occult blood testing. IV dc/d.  Patient appears to have plateaued.   Patient on target to meet rehab goals: yes, currently needs mod I for bed mobility and able to ambulate up to 100' with a RW and supervision for safety. Managed curbs with min assist, ramp recommended for discharge. Needs set up for upper body care and supervision for lower body care at the shower level. Completes toileting with CGA. Goals for discharge set for mod I - supervision level.   *See Care Plan and progress notes for long and short-term goals.   Revisions to Treatment Plan:  Smaller shrinker obtained  Teaching Needs: Safety, skin care, medications, secondary risk management, transfers, toileting, etc.  Current Barriers to Discharge: Decreased caregiver support, Home enviroment access/layout, and Weight bearing restrictions; Patient has a $650.00 deductible due before DME can be delivered.  Possible Resolutions to Barriers: HH follow up services DME: BSC, 3N1, w/c Ramp recommended for entry to home Recommendations for obtaining DME     Medical Summary Current Status: residual limb bleeding improved,  increased erythema of incision site, anemia  Barriers to Discharge: Wound care;Medical stability  Barriers to Discharge Comments: increased erythema of incision site, anemia Possible Resolutions to [employer] Focus: monitor incision daily, educated regarding placing shrinker directly on wound since it is medicated, discussed that anemia has improved and we will restart Eliquis today   Continued Need for Acute Rehabilitation Level of Care: The patient requires  daily medical management by a physician with specialized training in physical medicine and rehabilitation for the following reasons: Direction of a multidisciplinary physical rehabilitation program to maximize functional independence : Yes Medical management of patient stability for increased activity during participation in an intensive rehabilitation regime.: Yes Analysis of laboratory values and/or radiology reports with any subsequent need for medication adjustment and/or medical intervention. : Yes   I attest that I was present, lead the team conference, and concur with the assessment and plan of the team.   Dorien Chihuahua B 08/06/2022, 2:42 PM

## 2022-08-07 ENCOUNTER — Other Ambulatory Visit (HOSPITAL_COMMUNITY): Payer: Self-pay

## 2022-08-07 LAB — BPAM RBC
Blood Product Expiration Date: 202311102359
Blood Product Expiration Date: 202311112359
ISSUE DATE / TIME: 202310151203
Unit Type and Rh: 6200
Unit Type and Rh: 6200

## 2022-08-07 LAB — TYPE AND SCREEN
ABO/RH(D): AB POS
Antibody Screen: POSITIVE
DAT, IgG: NEGATIVE
Unit division: 0
Unit division: 0

## 2022-08-07 LAB — GLUCOSE, CAPILLARY
Glucose-Capillary: 140 mg/dL — ABNORMAL HIGH (ref 70–99)
Glucose-Capillary: 97 mg/dL (ref 70–99)
Glucose-Capillary: 145 mg/dL — ABNORMAL HIGH (ref 70–99)

## 2022-08-07 NOTE — Progress Notes (Signed)
Patient  educated regarding to repeat stool occult before discharge. Patient verbalize understanding. No BM during this tour .

## 2022-08-07 NOTE — Progress Notes (Signed)
Inpatient Rehabilitation Care Coordinator Discharge Note   Patient Details  Name: Robert Burnett MRN: [id number] Date of Birth: 1961-05-22   Discharge location: Home  Length of Stay: 13 Days  Discharge activity level: Sup  Home/community participation: nephew  Patient response VJ:KQASUO Literacy - How often do you need to have someone help you when you read instructions, pamphlets, or other written material from your doctor or pharmacy?: Sometimes  Patient response RV:IFBPPH Isolation - How often do you feel lonely or isolated from those around you?: Never  Services provided included: MD, RD, PT, OT, SLP, CM, RN, TR, Pharmacy, SW  Financial Services:  Financial Services Utilized: [employer]  Choices offered to/list presented to: patient  Follow-up services arranged:  Outpatient    Outpatient Servicies: Neuro Rehab      Patient response to transportation need: Is the patient able to respond to transportation needs?: Yes In the past 12 months, has lack of transportation kept you from medical appointments or from getting medications?: No In the past 12 months, has lack of transportation kept you from meetings, work, or from getting things needed for daily living?: No    Comments (or additional information):  Patient/Family verbalized understanding of follow-up arrangements:  Yes  Individual responsible for coordination of the follow-up plan: self or alton  Confirmed correct DME delivered: Dyanne Iha 08/07/2022    Dyanne Iha

## 2022-08-07 NOTE — Progress Notes (Signed)
Inpatient Rehabilitation Discharge Medication Review by a Pharmacist  A complete drug regimen review was completed for this patient to identify any potential clinically significant medication issues.  High Risk Drug Classes Is patient taking? Indication by Medication  Antipsychotic No   Anticoagulant Yes Apixaban-afib  Antibiotic No   Opioid Yes Fentanyl patch, oxycodone-pain  Antiplatelet Yes Plavix-CAD  Hypoglycemics/insulin Yes Insulin NPH-hyperglycemia  Vasoactive Medication Yes Furosemide, losartan, metoprolol-HTN/CAD  Chemotherapy No   Other Yes Atorvastatin-HLD Docusate-constipation Zetia-HLD Farxiga-DM Finasteride-BPH Pantoprazole-GERD Gabapentin-pain Lidocaine patch-pain Vit D-deficiency Methocarbamol-Spasms Miralax-constipation Vitamin C-wound healing     Type of Medication Issue Identified Description of Issue Recommendation(s)  Drug Interaction(s) (clinically significant)     Duplicate Therapy     Allergy     No Medication Administration End Date     Incorrect Dose     Additional Drug Therapy Needed     Significant med changes from prior encounter (inform family/care partners about these prior to discharge).    Other       Clinically significant medication issues were identified that warrant physician communication and completion of prescribed/recommended actions by midnight of the next day:  No  Name of provider notified for urgent issues identified:   Provider Method of Notification:     Pharmacist comments:   Time spent performing this drug regimen review (minutes):  20   Denishia Citro A. Levada Dy, PharmD, BCPS, FNKF Clinical Pharmacist [place] Please utilize Amion for appropriate phone number to reach the unit pharmacist ([place])  08/07/2022 7:46 AM

## 2022-08-07 NOTE — Progress Notes (Signed)
PROGRESS NOTE   Subjective/Complaints: Robert Burnett has no new concerns this morning He is very appreciative of everyone's care here.   ROS: +residual limb and phantom limb pain- increased erythema of residual limb, +insomnia, +increased confusion since starting cymbalta- improved since stopping, +chronic costochondritis-fentanyl patch helps, +chronic fatigue- stable at this time. Constipation improved.   Review of Systems  Constitutional:  Negative for chills and fever.  HENT:  Negative for congestion.   Respiratory:  Negative for shortness of breath.   Cardiovascular:  Negative for chest pain.       Chest wall soreness  Gastrointestinal:  Negative for abdominal pain, diarrhea, nausea and vomiting.  Musculoskeletal:  Positive for back pain and joint pain.     Objective:   No results found. Recent Labs    08/05/22 1028  WBC 5.3  HGB 7.3*  HCT 22.1*  PLT 221    No results for input(s): "NA", "K", "CL", "CO2", "GLUCOSE", "BUN", "CREATININE", "CALCIUM" in the last 72 hours.   Intake/Output Summary (Last 24 hours) at 08/07/2022 0954 Last data filed at 08/07/2022 0846 Gross per 24 hour  Intake 1672 ml  Output 1700 ml  Net -28 ml         Physical Exam: Vital Signs Blood pressure 129/80, pulse 64, temperature 98.1 F (36.7 C), temperature source Oral, resp. rate 20, height '6\' 1"'$  (1.854 m), weight 81.2 kg, SpO2 100 %.    General: Alert and oriented x 3, No apparent distress, BMI 23.62 HEENT: Head is normocephalic, atraumatic, PERRLA, EOMI, sclera anicteric, oral mucosa pink and moist Neck: Supple without JVD or lymphadenopathy Heart: Bradycardia Chest: CTA bilaterally without wheezes, rales, or rhonchi; no distress Chest wall and sternal tenderness Abdomen: Soft, non-tender, non-distended, bowel sounds positive. Extremities: No clubbing, cyanosis, or edema. Pulses are 2+ Psych: Pt's affect is appropriate. Pt  is cooperative Skin: warm and dry, limb guard in place, dons limb guard modI           Musculoskeletal:     Cervical back: Neck supple.     Comments:      LLE - 3+/5 hip flexion, limited by pain     RLE, BL UE - 5/5 throughout     + R foot resting plantarflexion tone Incision site with less bleeding but increased erythema, staples in place Neurological:     Sensation: Decreased to light touch over right foot, ankle    Mental Status: He is alert and oriented to person, place, and time.  Mild twitching has resolved    Assessment/Plan: 1. Functional deficits which require 3+ hours per day of interdisciplinary therapy in a comprehensive inpatient rehab setting. Physiatrist is providing close team supervision and 24 hour management of active medical problems listed below. Physiatrist and rehab team continue to assess barriers to discharge/monitor patient progress toward functional and medical goals  Care Tool:  Bathing    Body parts bathed by patient: Right arm, Left arm, Chest, Abdomen, Front perineal area, Buttocks, Left upper leg, Right upper leg, Right lower leg, Face     Body parts n/a: Left lower leg   Bathing assist Assist Level: Independent with assistive device     Upper  Body Dressing/Undressing Upper body dressing   What is the patient wearing?: Pull over shirt    Upper body assist Assist Level: Independent    Lower Body Dressing/Undressing Lower body dressing      What is the patient wearing?: Pants     Lower body assist Assist for lower body dressing: Independent with assitive device     Toileting Toileting    Toileting assist Assist for toileting: Supervision/Verbal cueing (for full toileting vs just urinal) Assistive Device Comment: urinal   Transfers Chair/bed transfer  Transfers assist     Chair/bed transfer assist level: Supervision/Verbal cueing     Locomotion Ambulation   Ambulation assist      Assist level:  Supervision/Verbal cueing Assistive device: Walker-rolling Max distance: 139f   Walk 10 feet activity   Assist     Assist level: Supervision/Verbal cueing Assistive device: Walker-rolling   Walk 50 feet activity   Assist Walk 50 feet with 2 turns activity did not occur: Safety/medical concerns  Assist level: Supervision/Verbal cueing Assistive device: Walker-rolling    Walk 150 feet activity   Assist Walk 150 feet activity did not occur: Safety/medical concerns  Assist level: Contact Guard/Touching assist Assistive device: Walker-rolling    Walk 10 feet on uneven surface  activity   Assist Walk 10 feet on uneven surfaces activity did not occur: Safety/medical concerns   Assist level: Contact Guard/Touching assist Assistive device: Walker-rolling   Wheelchair     Assist Is the patient using a wheelchair?: Yes Type of Wheelchair: Manual    Wheelchair assist level: Independent Max wheelchair distance: 1515f   Wheelchair 50 feet with 2 turns activity    Assist    Wheelchair 50 feet with 2 turns activity did not occur: Safety/medical concerns   Assist Level: Independent   Wheelchair 150 feet activity     Assist  Wheelchair 150 feet activity did not occur: Safety/medical concerns   Assist Level: Independent   Blood pressure 129/80, pulse 64, temperature 98.1 F (36.7 C), temperature source Oral, resp. rate 20, height '6\' 1"'$  (1.854 m), weight 81.2 kg, SpO2 100 %.  Medical Problem List and Plan: 1. Functional deficits secondary to left below the knee amputation, osteoarthritis with chronic pain.             -patient may not shower             -ELOS/Goals: 7-10 days  Continue CIR  -PRAFO has been ordered on right  Messaged April to schedule TC with EuZella Ballr myself in one week as patient would like [place] take over his pain management. Discussed this with patient. He is scheduled with EuZella Balln 10/26  -Discussed that we would like to obtain  stool occult before he discharges today.  2.  Antithrombotics: -DVT/anticoagulation:  Pharmaceutical: Eliquis             -antiplatelet therapy: Plavix 3. Pain Management (acute post-op, chronic chest/back): Fentanyl patch. continue Oxycodone, Robaxin, Tylenol as needed. Patient is concerned about receiving Fentanyl patch- asked Sa[place] he can be sure he has some before he leaves.   Discontinue cymbalta in case contributing to confusion             - continue Fentanyl patch 75 mcg/hr OP medication, per PDMP [facility]                       -  Restarted inpatient 10/6                       - Of note, documented insurance denial for further patches 8/21 d/t no pain contract  Increase gabapentin to '200mg'$  TID   Discontinue amitriptyline given confusion  Added amitriptyline and Cymbalta to his allergy list  -10/16 Increase Robaxin dose to '750mg'$  4. Insomnia: discontiue amitriptyline given confusion, he is sleeping well now.  5. Neuropsych/cognition: This patient is capable of making decisions on his own behalf. 6. Incisional wound: vac removed, size 2X shrinker ordered, requested Dr. Jess Barters team evaluate given increased erythema. Started on doxycycline on 10/13  7. Fluids/Electrolytes/Nutrition: Routine I's and O's             -continue carb modified diet             -continue vitamin C, zinc, Juven supplements 8: CAD/ Paroxysmal A fib: left heart cath 05/2022; stents to LAD             -Restarted on Eliquis 10/5, held due to bleeding             -continue Toprol XL 25 mg TID             - Intermittent tachycardia likely d/t poor pain control    9: T2DM c/b peripheral neuropathy: CBGs q AC and q HS             -continue Levemir 8 units daily             -continue Farxiga 10 mg daily  -placed nursing order to not provide any juice or soda  Start Metanx daily             -SSI  D/c protein  supplements  Provided list of foods to help with diabetes  Magnesium gluconate '250mg'$  started HS CBG (last 3)  Recent Labs    08/06/22 1623 08/06/22 2016 08/07/22 0559  GLUCAP 176* 163* 140*     10: Hyperlipidemia: continue Zetia, Lipitor 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 Hgb improved with transfusion, Eliquis restarted 14: Hyponatremia: DC IVF at admission. Is/Os negative; follow-up BMP  -10/7 Improved to 132 ,   Monitor weekly 15. Right foot plantarflexion tone: Order PRAFO on IPR admission.  16. Low albumin  -educated regarding choosing low added sugar/ high protein meals 17. History of chronic costochondritis/chest wall arthritis  on fentanyl 75  -Lidocaine patch started 18. Vitamin D deficiency, level 17.42: start ergocalciferol 50,000U once per week for 7 weeks 19. Suboptimal magnesium level: start magneisum gluconate '250mg'$  HS 20. Constipation: continue senna, miralax, and magnesium. Provided list of foods to help with constipation 21. Increased confusion as per therapy: Head CT ordered and is stable. Resolved with cessation of amitriptyline and cymbalta 22. Chronic Costochondritis: continue fentanyl patch 23. Chronic left hip pain: aquathermia ordered   >30 minutes spent in discharge of patient including review of medications and follow-up appointments, physical examination, and in answering all patient's questions   LOS: 13 days A FACE TO [place] 08/07/2022, 9:54 AM

## 2022-08-08 ENCOUNTER — Telehealth: Payer: Self-pay | Admitting: Orthopedic Surgery

## 2022-08-08 NOTE — Telephone Encounter (Signed)
Have attempted to contact patient and still unable. Voicemail full.Marland Kitchen

## 2022-08-08 NOTE — Telephone Encounter (Signed)
Have attempted to call patient x 3, voicemail full. Will continue to try to make contact. [phone]

## 2022-08-11 ENCOUNTER — Telehealth: Payer: Self-pay

## 2022-08-11 ENCOUNTER — Other Ambulatory Visit: Payer: Self-pay | Admitting: Family Medicine

## 2022-08-11 NOTE — Telephone Encounter (Signed)
Transition Care Management Unsuccessful Follow-up Telephone Call  Date of discharge and from where:  08/07/22 [facility]  Attempts:  1st Attempt  Reason for unsuccessful TCM follow-up call:  Voice mail full   Called [place]-the point of contact person and asked him to have pt call me.

## 2022-08-11 NOTE — Telephone Encounter (Signed)
Transition Care Management Unsuccessful Follow-up Telephone Call  Date of discharge and from where:  08/07/2022, [facility] - inpatient rehab.   Attempts:  1st Attempt  Reason for unsuccessful TCM follow-up call:  Voice mail full : [phone]

## 2022-08-12 ENCOUNTER — Telehealth: Payer: Self-pay

## 2022-08-12 NOTE — Telephone Encounter (Signed)
I called and no answer, mail box is full. We have tried multiple times for several days to reach pt without success. I called [name] May who is listed on pt's DPR and he states that he has not heard from the pt in days and that we are the third office to call him and try to sch appt. He advised that Birdena Crandall who is also listed on pt's DPR his nephew was staying with the pt since d/c and to try and call him. I called and there is no answer and mailbox is full. I will hold message and try again later.

## 2022-08-12 NOTE — Telephone Encounter (Signed)
Transition Care Management Follow-up Telephone Call Date of discharge and from where: 08/07/2022, [facility]- inpatient rehab How have you been since you were released from the hospital? He said that other than his leg hurting, everything is okay.  Any questions or concerns? Yes- he was concerned that he missed his appointment with Dr Margarita Rana on 10/16/292 and the office was not notified that he was in the hospital.  He is very  sorry to have missed it.  He is very appreciative of all the help he has received    Items Reviewed: Did the pt receive and understand the discharge instructions provided? Yes  Medications obtained and verified? Yes - he stated that he has all of his medications as well as a working glucometer and he did not have any questions about the med regime. He reported that last night blood sugar was in 140's.  Other? No  Any new allergies since your discharge? No  Dietary orders reviewed? No Do you have support at home? Yes - his nephew has been home from work assisting him as needed but he returns to work Architectural technologist. The patient said that his nephew has arranged for someone to stay with him, so he will not be alone,  and he is meeting that person today.   Home Care and Equipment/Supplies: Were home health services ordered? no If so, what is the name of the agency? N/a  Has the agency set up a time to come to the patient's home? N/a Were any new equipment or medical supplies ordered?  No- he said he already had the equipment.  What is the name of the medical supply agency? N/a Were you able to get the supplies/equipment? not applicable Do you have any questions related to the use of the equipment or supplies? No  Functional Questionnaire: (I = Independent and D = Dependent) ADLs: needs assistance with ADLs.  Has walker and wheelchair and said he uses both of them. He also has a bedside commode.   Follow up appointments reviewed:  PCP Hospital f/u appt confirmed?  He  said he will call to schedule an appointment.  He needs to make sure he has transportation    [facility] f/u appt confirmed? Yes  Scheduled to see PMR-08/14/2022.  He said he has an appointment with ortho but needs to call to confirm appointment day/time  o Are transportation arrangements needed?  He stated that he thinks the lady that will be caring for him will be able to drive him to appointments. If their condition worsens, is the pt aware to call PCP or go to the Emergency Dept.? Yes Was the patient provided with contact information for the PCP's office or ED? Yes Was to pt encouraged to call back with questions or concerns? Yes

## 2022-08-12 NOTE — Telephone Encounter (Signed)
I called and sw pt he sch an appt for 08/26/2022. He said that his vac was removed in the hospital and that he is wearing his shrinker. He states that the incision looks good he feels well and he is not having any issues. I advised that if anything changes and if he has any questions to call the office and we can move his appt up sooner.

## 2022-08-12 NOTE — Telephone Encounter (Signed)
Called pt, no answer, VM box is full.

## 2022-08-12 NOTE — Telephone Encounter (Signed)
Transition Care Management Unsuccessful Follow-up Telephone Call  Date of discharge and from where:  08/07/22 [facility]  Attempts:  2nd Attempt  Reason for unsuccessful TCM follow-up call:  Voice mail full

## 2022-08-14 ENCOUNTER — Encounter: Payer: Commercial Managed Care - HMO | Attending: Registered Nurse | Admitting: Registered Nurse

## 2022-08-14 ENCOUNTER — Encounter: Payer: Self-pay | Admitting: Registered Nurse

## 2022-08-14 VITALS — BP 121/73 | HR 83 | Ht 73.0 in

## 2022-08-14 DIAGNOSIS — Z79899 Other long term (current) drug therapy: Secondary | ICD-10-CM | POA: Insufficient documentation

## 2022-08-14 DIAGNOSIS — G894 Chronic pain syndrome: Secondary | ICD-10-CM | POA: Diagnosis not present

## 2022-08-14 DIAGNOSIS — Z5181 Encounter for therapeutic drug level monitoring: Secondary | ICD-10-CM | POA: Insufficient documentation

## 2022-08-14 DIAGNOSIS — Z89512 Acquired absence of left leg below knee: Secondary | ICD-10-CM | POA: Insufficient documentation

## 2022-08-14 DIAGNOSIS — I10 Essential (primary) hypertension: Secondary | ICD-10-CM | POA: Insufficient documentation

## 2022-08-14 DIAGNOSIS — Z794 Long term (current) use of insulin: Secondary | ICD-10-CM | POA: Diagnosis present

## 2022-08-14 DIAGNOSIS — E7849 Other hyperlipidemia: Secondary | ICD-10-CM | POA: Diagnosis present

## 2022-08-14 DIAGNOSIS — E118 Type 2 diabetes mellitus with unspecified complications: Secondary | ICD-10-CM | POA: Diagnosis present

## 2022-08-14 DIAGNOSIS — G8918 Other acute postprocedural pain: Secondary | ICD-10-CM | POA: Diagnosis present

## 2022-08-14 MED ORDER — FENTANYL 75 MCG/HR TD PT72: 1.0000 | MEDICATED_PATCH | TRANSDERMAL | 0 refills | Status: DC

## 2022-08-14 MED ORDER — OXYCODONE HCL 5 MG PO TABS: 5.0000 mg | ORAL_TABLET | Freq: Four times a day (QID) | ORAL | 0 refills | Status: DC | PRN

## 2022-08-14 NOTE — Progress Notes (Addendum)
Subjective:    Patient ID: Robert Burnett, male    DOB: 13-Feb-1961, 61 y.o.   MRN: [id number]  HPI: VOYD GROFT is a 61 y.o. male who is here for HFU appointment for F/U of his  S/P BKA left, Type 2 DM with long-term insulin use, Essential Hypertension and Hyperlipidemia. He presented to [facility] ED on 07/22/2022, with complaints of worsening pain and swelling of his left foot.   Dr Rogers Blocker H&P: 07/22/2022 Chief Complaint: left foot pain/drainage    HPI: Robert Burnett is a 61 y.o. male with medical history significant of  HTN, T2DM, fatty liver, HLD, hx of urothelial cancer, diabetic neuropathy who presented to ED with complaints of worsening left foot pain, foul drainage and swelling x 1 week. He has known diabetic ulcer of at base of metatarsal, present since at least July 2023. He states his left knee started to hurt from his arthritis and he wasn't able to really clean or dress his ulcer like he should due to inability to bend his leg. As the pain and foul smelling drainage increased he came to ED. He denies any fever/chills, N/V/D. He has had poor appetite over the past few days.    He was admitted in 03/2022 for diabetic foot infection with wet gangrene and necrotizing infection and osteomyelitis. S/p transmetatarsal amputation of the left foot with I&D by podiatry on 03/25/22 then OR again on 6/9 with Lisfranc amputation. ABI normal.    Admitted again on 8/16 for NSTEMI s/p LHC with with stent to LAD on 8/17. Also had new PAF. triple therapy with ASA, plavix and eliquis x 1 months then plavix/eliquis. 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    He smokes a pipe and does not drink alcohol.   MR. Robert Burnett underwent: LEFT BELOW KNEE AMPUTATION: on 07/23/2022 by Dr Sharol Given  Mr. Bonfanti was admitted to inpatient rehabilitation on 07/25/2022 and discharged home on 08/07/2022. He is awaiting clearance from Dr Sharol Given for therapy.  He states he has pain in his bilateral hips and left stump pain. He  rates his pain  0.   He arrived in wheelchair   Pain Inventory Average Pain 5 Pain Right Now 0 My pain is constant, sharp, burning, and stabbing  In the last 24 hours, has pain interfered with the following? General activity 10 Relation with others 0 Enjoyment of life 2 What TIME of day is your pain at its worst? daytime Sleep (in general) Fair  Pain is worse with: unsure Pain improves with: rest Relief from Meds: 5  use a walker ability to climb steps?  no do you drive?  no use a wheelchair needs help with transfers transfers alone Do you have any goals in this area?  yes  disabled: date disabled 2004 I need assistance with the following:  bathing, toileting, meal prep, household duties, and shopping Do you have any goals in this area?  yes  weakness  Any changes since last visit?  no  Any changes since last visit?  no    Family History  Problem Relation Age of Onset   Cancer Mother    Hypertension Father    Diabetes Father    Lung cancer Father    Diabetes Sister    Cancer Sister    Social History   Socioeconomic History   Marital status: Single    Spouse name: Not on file   Number of children: Not on file   Years of education: Not  on file   Highest education level: Not on file  Occupational History   Not on file  Tobacco Use   Smoking status: Every Day    Packs/day: 2.00    Years: 5.00    Total pack years: 10.00    Types: Pipe, Cigarettes   Smokeless tobacco: Former    Types: Chew   Tobacco comments:    Using pipe until about a week ago (05/2022)  Vaping Use   Vaping Use: Never used  Substance and Sexual Activity   Alcohol use: No   Drug use: No   Sexual activity: Not on file  Other Topics Concern   Not on file  Social History Narrative   Lives in [place] by himself.  Sedentary in the setting of chronic arthritic pain impacting his chest/back along w/ L foot amputation.   Social Determinants of Health   Financial Resource Strain: Not  on file  Food Insecurity: Not on file  Transportation Needs: Not on file  Physical Activity: Not on file  Stress: Not on file  Social Connections: Not on file   Past Surgical History:  Procedure Laterality Date   ACHILLES TENDON SURGERY Left 03/28/2022   Procedure: ACHILLES LENGTHENING/KIDNER;  Surgeon: [name], DPM;  Location: [facility];  Service: Podiatry;  Laterality: Left;   AMPUTATION Left 03/25/2022   Procedure: AMPUTATION DIGITS,TRANSMETARSAL AMPUTATION, REMOVAL OF TOES AND BONE BEHIND TOES ;  Surgeon: [name], DPM;  Location: [facility];  Service: Podiatry;  Laterality: Left;   AMPUTATION Left 07/23/2022   Procedure: LEFT BELOW KNEE AMPUTATION;  Surgeon: [name], MD;  Location: [place];  Service: Orthopedics;  Laterality: Left;   APPLICATION OF WOUND VAC Left 03/25/2022   Procedure: APPLICATION OF WOUND VAC;  Surgeon: [name], DPM;  Location: [facility];  Service: Podiatry;  Laterality: Left;   CORONARY STENT INTERVENTION N/A 06/05/2022   Procedure: CORONARY STENT INTERVENTION;  Surgeon: Belva Crome, MD;  Location: [facility];  Service: Cardiovascular;  Laterality: N/A;   CYSTOSCOPY/RETROGRADE/URETEROSCOPY Left 10/29/2020   Procedure: CYSTOSCOPY/RETROGRADE/ LEFT URETEROSCOPY WITH BIOPSY, LEFT URETERAL STENT;  Surgeon: Raynelle Bring, MD;  Location: [facility];  Service: Urology;  Laterality: Left;   IRRIGATION AND DEBRIDEMENT FOOT Left 03/28/2022   Procedure: AMPUTATION LISFRANC, SECONDARY WOUND CLOSURE;  Surgeon: [name], DPM;  Location: [facility];  Service: Podiatry;  Laterality: Left;   KNEE SURGERY Bilateral 1975 and 1989   LEFT HEART CATH AND CORONARY ANGIOGRAPHY N/A 06/05/2022   Procedure: LEFT HEART CATH AND CORONARY ANGIOGRAPHY;  Surgeon: Belva Crome, MD;  Location: [facility];  Service: Cardiovascular;  Laterality: N/A;   MULTIPLE TOOTH EXTRACTIONS     ROBOT ASSITED LAPAROSCOPIC NEPHROURETERECTOMY Left 12/13/2020   Procedure: XI ROBOT  ASSITED LAPAROSCOPIC NEPHROURETERECTOMY , POST OPERATIVE INTRAVESICAL GEMCITABINE;  Surgeon: Raynelle Bring, MD;  Location: [facility];  Service: Urology;  Laterality: Left;  ONLY NEEDS 240 MIN   TOEM AMPUTATION  Left    2012, second toe   TRANSURETHRAL RESECTION OF BLADDER TUMOR N/A 08/29/2021   Procedure: TRANSURETHRAL RESECTION OF BLADDER TUMOR (TURBT) WITH CYSTOSCOPY;  Surgeon: Raynelle Bring, MD;  Location: [facility];  Service: Urology;  Laterality: N/A;  GENERAL ANESTHESIA WITH PARALYSIS   WISDOM TOOTH EXTRACTION     Past Medical History:  Diagnosis Date   Allergy    Anemia    Arthritis    ARTHRITIS IN SPINE - MAKES PT HAVE CHEST PAIN- USES fENTANYL PATCH  Asthma    IN TEENS    Bacteremia    a. 03/2022 Group B Strep bacteremia in setting of diabetic L foot infxn/gangrene/osteo.   Diabetes mellitus    Diabetic peripheral neuropathy associated with type 2 diabetes mellitus ([place]) 12/12/2011   Fatty liver disease, nonalcoholic 37/90/2409   Foot osteomyelitis, left ([place])    a. 2012 s/p toe amputations on L; b. 03/2022 - admitted w/ wet gangrene and necrotizing infxn w/ osteomyelitis-->s/p transmetatarsal followed by Lisfranc amputation.   GERD (gastroesophageal reflux disease)    H/O degenerative disc disease    History of echocardiogram    a. 03/2022 Echo: EF 55-60%, no rwma, nl RV fxn, mildly elev PASP. Mild MR.   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([place])    There were no vitals taken for this visit.  Opioid Risk Score:   Fall Risk Score:  `1  Depression screen [facility] 2/9     04/30/2022    9:49 AM 01/06/2019    9:27 AM 05/24/2018   11:35 AM 02/01/2018   11:54 AM 10/10/2017   10:58 AM 06/18/2017   12:06 PM 02/20/2017   11:47 AM  Depression screen PHQ 2/9  Decreased Interest 3 0 0 0 0 0 0  Down, Depressed, Hopeless 1 0 0 0 0 0 0  PHQ - 2 Score 4 0 0 0 0 0 0  Altered sleeping 0        Tired, decreased energy 3         Change in appetite 1        Feeling bad or failure about yourself  0        Trouble concentrating 0        Moving slowly or fidgety/restless 0        Suicidal thoughts 0        PHQ-9 Score 8          Review of Systems  Constitutional:        Poor appetite  Musculoskeletal:  Positive for gait problem.       Pain in left lower stump/leg  Hematological:  Bruises/bleeds easily.       Objective:   Physical Exam Vitals and nursing note reviewed.  Constitutional:      Appearance: Normal appearance.  Cardiovascular:     Rate and Rhythm: Normal rate and regular rhythm.     Pulses: Normal pulses.     Heart sounds: Normal heart sounds.  Musculoskeletal:     Cervical back: Normal range of motion and neck supple.     Comments: Normal Muscle Bulk and Muscle Testing Reveals:  Upper Extremities: Full ROM and Muscle Strength 5/5 Lower Extremities: Right: Full ROM and Muscle Strength 5/5 Left BKA: Staples Open to Air: Bloody Drainage Noted: Site Cleansed  Arrived in wheelchair     Skin:    General: Skin is warm and dry.  Neurological:     Mental Status: He is alert and oriented to person, place, and time.  Psychiatric:        Mood and Affect: Mood normal.        Behavior: Behavior normal.         Assessment & Plan:  S/P BKA left,: Has a scheduled appointment with Dr Sharol Given: Neuro-Rehabilitation called: They have reached out to Mr. Teters, no answer. They will call him again, the representative states.  Type 2 DM with long-term insulin use: Continue current medication: PCP Following, Continue to Monitor ,Essential Hypertension: Continue current medication regimen. PCP Following. Continue to Monitor.   Hyperlipidemia.: Continue current medication regimen.  Chronic Costochondritis: Chest/ Back Pain Chronic Pain Syndrome: Refilled Fentanyl 28mg one patch every three days #10. We will continue the opioid monitoring program, this consists of regular clinic visits, examinations, urine  drug screen, pill [place] 12 [place]  08/14/2022 Post-Operative Pain : Continue Gabapentin. Continue to monitor. Refilled Oxycodone '5mg'$   one tablet 4 times a day as needed for pain #100: Goal to decrease to three times a day as needed for pain.   F/U in 1 month

## 2022-08-17 LAB — DRUG TOX MONITOR 1 W/CONF, ORAL FLD
Amphetamines: NEGATIVE ng/mL (ref <10)
Barbiturates: NEGATIVE ng/mL (ref <10)
Benzodiazepines: NEGATIVE ng/mL (ref <0.50)
Buprenorphine: NEGATIVE ng/mL (ref <0.10)
Cocaine: NEGATIVE ng/mL (ref <5.0)
Codeine: NEGATIVE ng/mL (ref <2.5)
Cotinine: 31.2 ng/mL — ABNORMAL HIGH (ref <5.0)
Dihydrocodeine: 4.1 ng/mL — ABNORMAL HIGH (ref <2.5)
Fentanyl: 1.31 ng/mL — ABNORMAL HIGH (ref <0.10)
Fentanyl: POSITIVE ng/mL — AB (ref <0.10)
Heroin Metabolite: NEGATIVE ng/mL (ref <1.0)
Hydrocodone: 69.6 ng/mL — ABNORMAL HIGH (ref <2.5)
Hydromorphone: NEGATIVE ng/mL (ref <2.5)
MARIJUANA: NEGATIVE ng/mL (ref <2.5)
MDMA: NEGATIVE ng/mL (ref <10)
Meprobamate: NEGATIVE ng/mL (ref <2.5)
Methadone: NEGATIVE ng/mL (ref <5.0)
Morphine: NEGATIVE ng/mL (ref <2.5)
Nicotine Metabolite: POSITIVE ng/mL — AB (ref <5.0)
Norhydrocodone: NEGATIVE ng/mL (ref <2.5)
Noroxycodone: 53.4 ng/mL — ABNORMAL HIGH (ref <2.5)
Opiates: POSITIVE ng/mL — AB (ref <2.5)
Oxycodone: >250 ng/mL — ABNORMAL HIGH (ref <2.5)
Oxymorphone: 6 ng/mL — ABNORMAL HIGH (ref <2.5)
Phencyclidine: NEGATIVE ng/mL (ref <10)
Tapentadol: NEGATIVE ng/mL (ref <5.0)
Tramadol: NEGATIVE ng/mL (ref <5.0)
Zolpidem: NEGATIVE ng/mL (ref <5.0)

## 2022-08-17 LAB — DRUG TOX ALC METAB W/CON, ORAL FLD: Alcohol Metabolite: NEGATIVE ng/mL (ref <25)

## 2022-08-20 ENCOUNTER — Ambulatory Visit: Payer: Commercial Managed Care - HMO | Admitting: Nurse Practitioner

## 2022-08-20 ENCOUNTER — Encounter: Payer: Self-pay | Admitting: Physician Assistant

## 2022-08-20 NOTE — Progress Notes (Unsigned)
Cardiology Office Note    Date:  08/21/2022   ID:  Arshad, Oberholzer 29-Aug-1961, MRN [id number]  PCP:  Charlott Rakes, MD  Cardiologist:  Kate Sable, MD  Electrophysiologist:  None   Chief Complaint: f/u CAD  History of Present Illness:   Robert Burnett is a 61 y.o. 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   Our team first met him in 05/2022 at time of NSTEMI at which time cath showed 99% prox LAD tx with DES. Echo 06/05/22 showed LVEF 40-45%, Severe hypokinesis of the left ventricular, mid-apical anteroseptal wall, anterior wall and apical segment, mild MR. Cardiology notes also report documentation of afib during a prior 03/2022 admission - due to recurrence during 05/2022 admission, was started on Eliquis. He was discharged on triple therapy with ASA + Plavix + Eliquis with plan to stop ASA after 1 month. He was originally not on ACE/ARB due to renal issues. In f/u 06/2022 he was to start [facility] but it was too costly so started on losartan instead. Vania Rea was also too expensive for him so is on [place]. He was recently admitted 10/6-10/19/23 for osteomyelitis requiring L BKA, also required blood transfusion due to ABL anemia during that admission.  He is seen in follow-up today with his caregiver and niece overall doing well from cardiac standpoint without any recent chest pain, SOB, edema, orthopnea, syncope or difficulty tolerating medicines. Caregiver inquired about prescribing Humira for arthritis and I suggested they discuss with his pain management team. He reports home BPs are  normal. BP at outside office last week was 121/73. He no longer smokes cigarettes but periodically smokes a pipe which he is working on cutting down.    Labwork independently reviewed: 08/08/22 Hgb 7.3, plt 221, K 4.0, Cr 1.12, A1C 6.8 05/2022 TSH wnl, LDL 126, trig 118, Mg 1.9   Cardiology Studies:   Studies reviewed are outlined and summarized above. Reports included below if pertinent.   2D echo 05/2022   1. Limted for heart failure   2. No apical thrombus with Definity contrast. Left ventricular ejection  fraction, by estimation, is 40 to 45%. Left ventricular ejection fraction  by 2D MOD biplane is 42.6 %. The left ventricle has mildly decreased  function. The left ventricle  demonstrates regional wall motion abnormalities (see scoring  diagram/findings for description). There is severe hypokinesis of the left  ventricular, mid-apical anteroseptal wall, anterior wall and apical  segment. This is consistent with LAD territory  ischemia/infarct.   3. The mitral valve is abnormal. Mild mitral valve regurgitation.   Comparison(s): Changes from prior study are noted. 03/27/2022: LVEF 55-60%,  normal wall motion.   Cath 05/2022 CONCLUSIONS: 99% proximal LAD treated with overlapping Synergy stents dilated to 3.5 mm in diameter (3.5 x 16 proximal and overlapped distally with a 3.0 x 12).  TIMI grade II flow increased to TIMI grade III flow postprocedure.  Very distal stent margin has persistent eccentric 30 to 40% narrowing. Left main widely patent Circumflex gives origin to 4 obtuse marginals.  The third obtuse marginal is large and free of obstruction. Right coronary is dominant and contains proximal  25% narrowing. Left ventricular angiography by hand-injection demonstrated severe anteroapical hypokinesis with EF in the 35% range. Ongoing post procedural pain without acute EKG changes.  Does have anterior T wave inversion as he did prior to the procedure.  Also states that he has had  chronic back pain that causes chest discomfort. RECOMMENDATIONS: IV nitroglycerin until pain controlled. Aspirin and Brilinta for 12 months. Distinguish whether ongoing chest discomfort is thoracic spine versus ongoing ischemia.  Suspect thoracic pain from disc disease.   03/2022 ABIs      Summary:  Right: Resting right ankle-brachial index is within normal range. No  evidence of significant right lower extremity arterial disease. The right  toe-brachial index is abnormal.   Left: Resting left ankle-brachial index is within normal range. No  evidence of significant left lower extremity arterial disease.   Unable to obtain TBI due to great toe ulceration.  *See table(s) above for measurements and observations.        Past Medical History:  Diagnosis Date   Allergy    Anemia    Anemia    Arthritis    ARTHRITIS IN SPINE - MAKES PT HAVE CHEST PAIN- USES fENTANYL PATCH    Asthma    IN TEENS    Bacteremia    a. 03/2022 Group B Strep bacteremia in setting of diabetic L foot infxn/gangrene/osteo.   CAD (coronary artery disease)    Chronic HFrEF (heart failure with reduced ejection fraction) ([place])    Diabetes mellitus    Diabetic peripheral neuropathy associated with type 2 diabetes mellitus ([place]) 12/12/2011   Fatty liver disease, nonalcoholic 62/70/3500   Foot osteomyelitis, left ([place])    a. 2012 s/p toe amputations on L; b. 03/2022 - admitted w/ wet gangrene and necrotizing infxn w/ osteomyelitis-->s/p transmetatarsal followed by Lisfranc amputation.   GERD (gastroesophageal reflux disease)    H/O degenerative disc disease    History of echocardiogram    a. 03/2022 Echo: EF 55-60%, no rwma, nl RV fxn, mildly elev PASP. Mild MR.   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[facility])     Past Surgical History:  Procedure Laterality Date   ACHILLES TENDON SURGERY Left 03/28/2022   Procedure: ACHILLES LENGTHENING/KIDNER;   Surgeon: [name], DPM;  Location: [facility];  Service: Podiatry;  Laterality: Left;   AMPUTATION Left 03/25/2022   Procedure: AMPUTATION DIGITS,TRANSMETARSAL AMPUTATION, REMOVAL OF TOES AND BONE BEHIND TOES ;  Surgeon: [name], DPM;  Location: [facility];  Service: Podiatry;  Laterality: Left;   AMPUTATION Left 07/23/2022   Procedure: LEFT BELOW KNEE AMPUTATION;  Surgeon: [name], MD;  Location: [place];  Service: Orthopedics;  Laterality: Left;   APPLICATION OF WOUND VAC Left 03/25/2022   Procedure: APPLICATION OF WOUND VAC;  Surgeon: [name], DPM;  Location: [facility];  Service: Podiatry;  Laterality: Left;   CORONARY STENT INTERVENTION N/A 06/05/2022   Procedure: CORONARY STENT INTERVENTION;  Surgeon: Belva Crome, MD;  Location: [place] [facility];  Service: Cardiovascular;  Laterality: N/A;   CYSTOSCOPY/RETROGRADE/URETEROSCOPY Left 10/29/2020   Procedure: CYSTOSCOPY/RETROGRADE/ LEFT URETEROSCOPY WITH BIOPSY, LEFT URETERAL STENT;  Surgeon: Raynelle Bring, MD;  Location: [facility];  Service: Urology;  Laterality: Left;   IRRIGATION AND DEBRIDEMENT FOOT Left 03/28/2022   Procedure: AMPUTATION LISFRANC, SECONDARY WOUND CLOSURE;  Surgeon: [name], DPM;  Location: [facility];  Service:  Podiatry;  Laterality: Left;   KNEE SURGERY Bilateral 1975 and 1989   LEFT HEART CATH AND CORONARY ANGIOGRAPHY N/A 06/05/2022   Procedure: LEFT HEART CATH AND CORONARY ANGIOGRAPHY;  Surgeon: Belva Crome, MD;  Location: [place] [facility];  Service: Cardiovascular;  Laterality: N/A;   MULTIPLE TOOTH EXTRACTIONS     ROBOT ASSITED LAPAROSCOPIC NEPHROURETERECTOMY Left 12/13/2020   Procedure: XI ROBOT ASSITED LAPAROSCOPIC NEPHROURETERECTOMY , POST OPERATIVE INTRAVESICAL GEMCITABINE;  Surgeon: Raynelle Bring, MD;  Location[facility];  Service: Urology;  Laterality: Left;  ONLY NEEDS 240 MIN   TOEM AMPUTATION  Left    2012, second toe   TRANSURETHRAL RESECTION OF BLADDER TUMOR N/A 08/29/2021    Procedure: TRANSURETHRAL RESECTION OF BLADDER TUMOR (TURBT) WITH CYSTOSCOPY;  Surgeon: Raynelle Bring, MD;  Location: [facility];  Service: Urology;  Laterality: N/A;  GENERAL ANESTHESIA WITH PARALYSIS   WISDOM TOOTH EXTRACTION      Current Medications: Current Meds  Medication Sig   acetaminophen (TYLENOL) 325 MG tablet Take 1-2 tablets (325-650 mg total) by mouth every 4 (four) hours as needed for mild pain.   apixaban (ELIQUIS) 5 MG TABS tablet Take 1 tablet (5 mg total) by mouth 2 (two) times daily.   ascorbic acid (VITAMIN C) 1000 MG tablet Take 1 tablet (1,000 mg total) by mouth daily.   atorvastatin (LIPITOR) 80 MG tablet Take 1 tablet (80 mg total) by mouth daily.   bisacodyl (DULCOLAX) 5 MG EC tablet Take 1 tablet (5 mg total) by mouth daily as needed for moderate constipation.   clopidogrel (PLAVIX) 75 MG tablet Take 1 tablet (75 mg total) by mouth daily.   dapagliflozin propanediol (FARXIGA) 10 MG TABS tablet Take 1 tablet (10 mg total) by mouth daily before breakfast.   diclofenac Sodium (VOLTAREN) 1 % GEL Apply 2 g topically 4 (four) times daily as needed (Left hip pain).   docusate sodium (COLACE) 100 MG capsule Take 1 capsule (100 mg total) by mouth daily.   ezetimibe (ZETIA) 10 MG tablet Take 1 tablet (10 mg total) by mouth daily.   fentaNYL (DURAGESIC) 75 MCG/HR Place 1 patch onto the skin every 3 (three) days.   finasteride (PROSCAR) 5 MG tablet Take 1 tablet (5 mg total) by mouth daily.   furosemide (LASIX) 40 MG tablet Take 1 tablet (40 mg total) by mouth daily as needed for edema or fluid (Take 1 tablet if your weight increases more than 2 pounds in 24 hours.).   gabapentin (NEURONTIN) 100 MG capsule Take 2 capsules (200 mg total) by mouth 3 (three) times daily.   insulin NPH-regular Human (70-30) 100 UNIT/ML injection Inject 10 Units into the skin 2 (two) times daily with a meal. RELION BRAND PLEASE   l-methylfolate-B6-B12 (METANX) 3-35-2 MG TABS tablet Take 1 tablet by mouth  daily.   lidocaine (LIDODERM) 5 % Place 2 patches onto the skin daily. Remove & Discard patch within 12 hours or as directed by MD   losartan (COZAAR) 50 MG tablet Take 1 tablet (50 mg total) by mouth daily.   magnesium oxide (MAG-OX) 400 MG tablet Take 0.5 tablets (200 mg total) by mouth at bedtime.   methocarbamol (ROBAXIN) 750 MG tablet Take 1 tablet (750 mg total) by mouth every 8 (eight) hours as needed for muscle spasms.   metoprolol succinate (TOPROL-XL) 25 MG 24 hr tablet Take 1 tablet (25 mg total) by mouth 3 (three) times daily.   nicotine (NICODERM CQ - DOSED IN MG/24 HR) 7  mg/24hr patch 7 mg patch daily x2 weeks and stop   oxyCODONE (OXY IR/ROXICODONE) 5 MG immediate release tablet Take 1 tablet (5 mg total) by mouth 4 (four) times daily as needed for moderate pain.   pantoprazole (PROTONIX) 40 MG tablet Take 1 tablet (40 mg total) by mouth daily.   polyethylene glycol (MIRALAX / GLYCOLAX) 17 g packet Take 17 g by mouth daily.   Vitamin D, Ergocalciferol, (DRISDOL) 1.25 MG (50000 UNIT) CAPS capsule Take 1 capsule (50,000 Units total) by mouth every 7 (seven) days.      Allergies:   Amitriptyline hcl, Cymbalta [duloxetine hcl], Amlodipine, and Prozac [fluoxetine hcl]   Social History   Socioeconomic History   Marital status: Single    Spouse name: Not on file   Number of children: Not on file   Years of education: Not on file   Highest education level: Not on file  Occupational History   Not on file  Tobacco Use   Smoking status: Every Day    Packs/day: 2.00    Years: 5.00    Total pack years: 10.00    Types: Pipe, Cigarettes   Smokeless tobacco: Former    Types: Chew   Tobacco comments:    Using pipe until about a week ago (05/2022)  Vaping Use   Vaping Use: Never used  Substance and Sexual Activity   Alcohol use: No   Drug use: No   Sexual activity: Not on file  Other Topics Concern   Not on file  Social History Narrative   Lives in [place] by himself.   Sedentary in the setting of chronic arthritic pain impacting his chest/back along w/ L foot amputation.   Social Determinants of Health   Financial Resource Strain: Not on file  Food Insecurity: Not on file  Transportation Needs: Not on file  Physical Activity: Not on file  Stress: Not on file  Social Connections: Not on file     Family History:  The patient's family history includes Cancer in his mother and sister; Diabetes in his father and sister; Hypertension in his father; Lung cancer in his father.  ROS:   Please see the history of present illness.  All other systems are reviewed and otherwise negative.    EKG(s)/Additional Labs   EKG:  EKG is ordered today, personally reviewed, demonstrating 07/22/22 NSR 93bpm, RSR V1, nonspecific TW changes avL, V1, overall improved from before  Recent Labs: 06/07/2022: TSH 1.744 07/26/2022: ALT 22; Magnesium 1.7 08/04/2022: BUN 25; Creatinine, Ser 1.12; Potassium 4.0; Sodium 138 08/05/2022: Hemoglobin 7.3; Platelets 221  Recent Lipid Panel    Component Value Date/Time   CHOL 188 06/05/2022 0355   TRIG 118 06/05/2022 0355   HDL 38 (L) 06/05/2022 0355   CHOLHDL 4.9 06/05/2022 0355   VLDL 24 06/05/2022 0355   LDLCALC 126 (H) 06/05/2022 0355    PHYSICAL EXAM:    VS:  BP 130/70 (BP Location: Left Arm, Patient Position: Sitting, Cuff Size: Normal)   Pulse 67   Ht _0  (1.854 m)   Wt 178 lb (80.7 kg)   SpO2 97%   BMI 23.48 kg/m   BMI: Body mass index is 23.48 kg/m.  GEN: Well nourished, well developed pale male in no acute distress HEENT: normocephalic, atraumatic Neck: no JVD, carotid bruits, or masses Cardiac: RRR; no murmurs, rubs, or gallops, no RLE edema  Respiratory:  clear to auscultation bilaterally, normal work of breathing GI: soft, nontender, nondistended, + BS, + prominent abdominal  pulsation without bruit MS: L BKA Skin: warm and dry, no rash Neuro:  Alert and Oriented x 3, Strength and sensation are intact,  follows commands Psych: euthymic mood, full affect  Wt Readings from Last 3 Encounters:  08/21/22 178 lb (80.7 kg)  08/01/22 179 lb 0.2 oz (81.2 kg)  07/21/22 179 lb (81.2 kg)     ASSESSMENT & PLAN:   1. CAD, Hyperlipidemia - doing well without recurrent angina. No longer on ASA due to dual therapy with Plavix and Eliquis. Continue statin, ezetimibe. Consolidate Toprol to once daily dosing. He has eaten pizza today. Will obtain fasting CMET/lipids when he returns for his echo (as outlined below).  2. Chronic HFrEF, essential HTN, mild MR - appears euvolemic on exam. Continue losartan, Toprol (consolidate to once daily dosing), SGLT2i as tolerated, Lasix PRN. Will arrange follow-up limited echo to re-evaluate LVEF to see if there has been improvement now that he is several months out from PCI/MI. If EF is still down would consider spironolactone.  3. Paroxysmal atrial fibrillation - remains in NSR at this time. Consolidate Toprol to 31m to once daily dosing. Continue Eliquis as recommended by primary cardiologist. Dose remains appropriate. F/u CBC today.    4. Abdominal aortic pulsation - given age and tobacco use hx, will arrange ultrasound of aorta.     Cardiac Rehabilitation Eligibility Assessment  The patient has declined or is not appropriate for cardiac rehabilitation. (Amputee, still recovering from this.)    Disposition: F/u with Dr. AGaren Lahin 3 months ([place] cx [place]20[place] serves as his post-hospital f/u).   Medication Adjustments/Labs and Tests Ordered: Current medicines are reviewed at length with the patient today.  Concerns regarding medicines are outlined above. Medication changes, Labs and Tests ordered today are summarized above and listed in the Patient Instructions accessible in Encounters.   Signed, DCharlie Pitter PA-C  08/21/2022 2:52 PM    CPort Tobacco VillagePhone: [phone] Fax: [phone]

## 2022-08-21 ENCOUNTER — Ambulatory Visit: Payer: Commercial Managed Care - HMO | Attending: Nurse Practitioner | Admitting: Physician Assistant

## 2022-08-21 ENCOUNTER — Encounter: Payer: Self-pay | Admitting: Physician Assistant

## 2022-08-21 VITALS — BP 130/70 | HR 67 | Ht 73.0 in | Wt 178.0 lb

## 2022-08-21 DIAGNOSIS — E785 Hyperlipidemia, unspecified: Secondary | ICD-10-CM

## 2022-08-21 DIAGNOSIS — I2510 Atherosclerotic heart disease of native coronary artery without angina pectoris: Secondary | ICD-10-CM

## 2022-08-21 DIAGNOSIS — I10 Essential (primary) hypertension: Secondary | ICD-10-CM

## 2022-08-21 DIAGNOSIS — I5022 Chronic systolic (congestive) heart failure: Secondary | ICD-10-CM

## 2022-08-21 DIAGNOSIS — R0989 Other specified symptoms and signs involving the circulatory and respiratory systems: Secondary | ICD-10-CM

## 2022-08-21 DIAGNOSIS — I480 Paroxysmal atrial fibrillation: Secondary | ICD-10-CM

## 2022-08-21 NOTE — Patient Instructions (Addendum)
Medication Instructions:  Your physician recommends that you continue on your current medications as directed. Please refer to the Current Medication list given to you today. *If you need a refill on your cardiac medications before your next appointment, please call your pharmacy*   Lab Work: TODAY-CBC COMPLETE WHEN YOU COME IN FOR ECHO-FASTING CMET & LIPIDS If you have labs (blood work) drawn today and your tests are completely normal, you will receive your results only by: [place] (if you have MyChart) OR A paper copy in the mail If you have any lab test that is abnormal or we need to change your treatment, we will call you to review the results.   Testing/Procedures: Your physician has requested that you have an LIMITED echocardiogram. Echocardiography is a painless test that uses sound waves to create images of your heart. It provides your doctor with information about the size and shape of your heart and how well your heart's chambers and valves are working. This procedure takes approximately one hour. There are no restrictions for this procedure. Please do NOT wear cologne, perfume, aftershave, or lotions (deodorant is allowed). Please arrive 15 minutes prior to your appointment time.  Your physician has requested that you have an abdominal aorta duplex. During this test, an ultrasound is used to evaluate the aorta. Allow 30 minutes for this exam. Do not eat after midnight the day before and avoid carbonated beverages   Follow-Up: At [facility], you and your health needs are our priority.  As part of our continuing mission to provide you with exceptional heart care, we have created designated Provider Care Teams.  These Care Teams include your primary Cardiologist (physician) and Advanced Practice Providers (APPs -  Physician Assistants and Nurse Practitioners) who all work together to provide you with the care you need, when you need it.  We recommend signing up for  the patient portal called "MyChart".  Sign up information is provided on this After Visit Summary.  MyChart is used to connect with patients for Virtual Visits (Telemedicine).  Patients are able to view lab/test results, encounter notes, upcoming appointments, etc.  Non-urgent messages can be sent to your provider as well.   To learn more about what you can do with MyChart, go to [email or web address].    Your next appointment:   3 month(s)  The format for your next appointment:   In Person  Provider:   You may see Kate Sable, MD or one of the following Advanced Practice Providers on your designated Care Team:   Murray Hodgkins, NP Christell Faith, PA-C Cadence Kathlen Mody, PA-C Gerrie Nordmann, NP   Other Instructions   Important Information About Sugar

## 2022-08-22 ENCOUNTER — Other Ambulatory Visit: Payer: Self-pay | Admitting: *Deleted

## 2022-08-22 DIAGNOSIS — D649 Anemia, unspecified: Secondary | ICD-10-CM

## 2022-08-22 LAB — CBC
Hematocrit: 28 % — ABNORMAL LOW (ref 37.5–51.0)
Hemoglobin: 8.6 g/dL — ABNORMAL LOW (ref 13.0–17.7)
MCH: 26.5 pg — ABNORMAL LOW (ref 26.6–33.0)
MCHC: 30.7 g/dL — ABNORMAL LOW (ref 31.5–35.7)
MCV: 86 fL (ref 79–97)
Platelets: 135 10*3/uL — ABNORMAL LOW (ref 150–450)
RBC: 3.24 x10E6/uL — ABNORMAL LOW (ref 4.14–5.80)
RDW: 16.5 % — ABNORMAL HIGH (ref 11.6–15.4)
WBC: 4 10*3/uL (ref 3.4–10.8)

## 2022-08-26 ENCOUNTER — Ambulatory Visit (INDEPENDENT_AMBULATORY_CARE_PROVIDER_SITE_OTHER): Payer: Commercial Managed Care - HMO | Admitting: Orthopedic Surgery

## 2022-08-26 DIAGNOSIS — Z89512 Acquired absence of left leg below knee: Secondary | ICD-10-CM

## 2022-08-26 DIAGNOSIS — S88112A Complete traumatic amputation at level between knee and ankle, left lower leg, initial encounter: Secondary | ICD-10-CM

## 2022-08-26 MED ORDER — DOXYCYCLINE HYCLATE 100 MG PO TABS: 100.0000 mg | ORAL_TABLET | Freq: Two times a day (BID) | ORAL | 0 refills | Status: DC

## 2022-08-27 ENCOUNTER — Ambulatory Visit: Payer: Commercial Managed Care - HMO

## 2022-08-27 ENCOUNTER — Ambulatory Visit (HOSPITAL_COMMUNITY): Payer: Commercial Managed Care - HMO | Attending: Physician Assistant

## 2022-08-27 DIAGNOSIS — E785 Hyperlipidemia, unspecified: Secondary | ICD-10-CM | POA: Diagnosis not present

## 2022-08-27 DIAGNOSIS — I10 Essential (primary) hypertension: Secondary | ICD-10-CM | POA: Diagnosis not present

## 2022-08-27 DIAGNOSIS — R0989 Other specified symptoms and signs involving the circulatory and respiratory systems: Secondary | ICD-10-CM

## 2022-08-27 DIAGNOSIS — I480 Paroxysmal atrial fibrillation: Secondary | ICD-10-CM | POA: Insufficient documentation

## 2022-08-27 DIAGNOSIS — I5022 Chronic systolic (congestive) heart failure: Secondary | ICD-10-CM

## 2022-08-27 DIAGNOSIS — D649 Anemia, unspecified: Secondary | ICD-10-CM | POA: Insufficient documentation

## 2022-08-27 DIAGNOSIS — I2510 Atherosclerotic heart disease of native coronary artery without angina pectoris: Secondary | ICD-10-CM

## 2022-08-27 LAB — ECHOCARDIOGRAM LIMITED
Area-P 1/2: 2.93 cm2
S' Lateral: 3.5 cm

## 2022-08-28 LAB — COMPREHENSIVE METABOLIC PANEL
ALT: 7 IU/L (ref 0–44)
AST: 12 IU/L (ref 0–40)
Albumin/Globulin Ratio: 1.1 — ABNORMAL LOW (ref 1.2–2.2)
Albumin: 3.6 g/dL — ABNORMAL LOW (ref 3.9–4.9)
Alkaline Phosphatase: 128 IU/L — ABNORMAL HIGH (ref 44–121)
BUN/Creatinine Ratio: 11 (ref 10–24)
BUN: 11 mg/dL (ref 8–27)
Bilirubin Total: 0.4 mg/dL (ref 0.0–1.2)
CO2: 25 mmol/L (ref 20–29)
Calcium: 8.5 mg/dL — ABNORMAL LOW (ref 8.6–10.2)
Chloride: 101 mmol/L (ref 96–106)
Creatinine, Ser: 0.96 mg/dL (ref 0.76–1.27)
Globulin, Total: 3.2 g/dL (ref 1.5–4.5)
Glucose: 107 mg/dL — ABNORMAL HIGH (ref 70–99)
Potassium: 4.2 mmol/L (ref 3.5–5.2)
Sodium: 138 mmol/L (ref 134–144)
Total Protein: 6.8 g/dL (ref 6.0–8.5)
eGFR: 89 mL/min/{1.73_m2} (ref >59)

## 2022-08-28 LAB — LIPID PANEL
Chol/HDL Ratio: 2.7 ratio (ref 0.0–5.0)
Cholesterol, Total: 133 mg/dL (ref 100–199)
HDL: 50 mg/dL (ref >39)
LDL Chol Calc (NIH): 66 mg/dL (ref 0–99)
Triglycerides: 90 mg/dL (ref 0–149)
VLDL Cholesterol Cal: 17 mg/dL (ref 5–40)

## 2022-08-28 LAB — CBC
Hematocrit: 29.1 % — ABNORMAL LOW (ref 37.5–51.0)
Hemoglobin: 8.9 g/dL — ABNORMAL LOW (ref 13.0–17.7)
MCH: 26.5 pg — ABNORMAL LOW (ref 26.6–33.0)
MCHC: 30.6 g/dL — ABNORMAL LOW (ref 31.5–35.7)
MCV: 87 fL (ref 79–97)
Platelets: 135 10*3/uL — ABNORMAL LOW (ref 150–450)
RBC: 3.36 x10E6/uL — ABNORMAL LOW (ref 4.14–5.80)
RDW: 16.2 % — ABNORMAL HIGH (ref 11.6–15.4)
WBC: 4.5 10*3/uL (ref 3.4–10.8)

## 2022-09-02 ENCOUNTER — Ambulatory Visit: Payer: Commercial Managed Care - HMO | Admitting: Orthopedic Surgery

## 2022-09-02 ENCOUNTER — Other Ambulatory Visit (HOSPITAL_COMMUNITY): Payer: Commercial Managed Care - HMO

## 2022-09-02 ENCOUNTER — Telehealth: Payer: Self-pay

## 2022-09-02 NOTE — Telephone Encounter (Signed)
Pt is s/p a BKA and was a no show on the schedule today. Can you please call and make an appt for the pt to come in this week to see Junie Panning or Sharol Given and I can open a space if needed? Thanks

## 2022-09-03 NOTE — Telephone Encounter (Signed)
Looked through pts contacts and found other number. Pt lives with Gary Sager-[phone], left message for patient to call asap to schedule appt if no response . I will try and call other contacts for an appt.

## 2022-09-03 NOTE — Telephone Encounter (Signed)
Would you mind trying to reach out again. Maybe hold for a day or two? Thanks!

## 2022-09-05 ENCOUNTER — Telehealth: Payer: Self-pay | Admitting: *Deleted

## 2022-09-05 ENCOUNTER — Encounter: Payer: Self-pay | Admitting: Registered Nurse

## 2022-09-05 ENCOUNTER — Encounter: Payer: Commercial Managed Care - HMO | Attending: Registered Nurse | Admitting: Registered Nurse

## 2022-09-05 VITALS — BP 154/80 | HR 63 | Ht 73.0 in

## 2022-09-05 DIAGNOSIS — G548 Other nerve root and plexus disorders: Secondary | ICD-10-CM

## 2022-09-05 DIAGNOSIS — G894 Chronic pain syndrome: Secondary | ICD-10-CM | POA: Diagnosis not present

## 2022-09-05 DIAGNOSIS — Z5181 Encounter for therapeutic drug level monitoring: Secondary | ICD-10-CM

## 2022-09-05 DIAGNOSIS — Z89512 Acquired absence of left leg below knee: Secondary | ICD-10-CM | POA: Diagnosis present

## 2022-09-05 DIAGNOSIS — G546 Phantom limb syndrome with pain: Secondary | ICD-10-CM | POA: Insufficient documentation

## 2022-09-05 DIAGNOSIS — Z79899 Other long term (current) drug therapy: Secondary | ICD-10-CM

## 2022-09-05 DIAGNOSIS — M940 Chondrocostal junction syndrome [Tietze]: Secondary | ICD-10-CM

## 2022-09-05 MED ORDER — FENTANYL 75 MCG/HR TD PT72: 1.0000 | MEDICATED_PATCH | TRANSDERMAL | 0 refills | Status: DC

## 2022-09-05 MED ORDER — OXYCODONE HCL 5 MG PO TABS: 5.0000 mg | ORAL_TABLET | Freq: Three times a day (TID) | ORAL | 0 refills | Status: DC | PRN

## 2022-09-05 NOTE — Telephone Encounter (Signed)
Oral swab is inconsistent with prescribing. He is prescribed Fentanyl and oxycodone, but there is presence of hydrocodone and its metabolites. This has not been prescribed for him according to PMP.

## 2022-09-05 NOTE — Progress Notes (Unsigned)
Subjective:    Patient ID: Robert Burnett, male    DOB: 02-Oct-1961, 62 y.o.   MRN: [id number]  HPI: Robert Burnett is a 61 y.o. male who returns for follow up appointment for chronic pain and medication refill. He states his  pain is located in his ribs and left phantom pain. Marland KitchenHe rates his pain 6. He's not following a current exercise regime. He arrived in wheelchair.   Mr. Dollar Morphine equivalent is 210.00 MME.   Last Oral Swab was Performed on 08/14/2022, it was inconsistent. Mr. Maybee states his cousin gave him a Hydrocodone, Narcotic Policy was reviewed, this was discussed with Dr Ranell Patrick. He was given a written warning letter, he verbalizes understanding.   Oral Swab ordered today.   Pain Inventory Average Pain 5 Pain Right Now 6 My pain is stabbing  In the last 24 hours, has pain interfered with the following? General activity 0 Relation with others 0 Enjoyment of life 0 What TIME of day is your pain at its worst? varies Sleep (in general) Fair  Pain is worse with: some activites Pain improves with: rest and medication Relief from Meds: 5  Family History  Problem Relation Age of Onset   Cancer Mother    Hypertension Father    Diabetes Father    Lung cancer Father    Diabetes Sister    Cancer Sister    Social History   Socioeconomic History   Marital status: Single    Spouse name: Not on file   Number of children: Not on file   Years of education: Not on file   Highest education level: Not on file  Occupational History   Not on file  Tobacco Use   Smoking status: Every Day    Packs/day: 2.00    Years: 5.00    Total pack years: 10.00    Types: Pipe, Cigarettes   Smokeless tobacco: Former    Types: Chew   Tobacco comments:    Using pipe until about a week ago (05/2022)  Vaping Use   Vaping Use: Never used  Substance and Sexual Activity   Alcohol use: No   Drug use: No   Sexual activity: Not on file  Other Topics Concern   Not on file  Social  History Narrative   Lives in [place] by himself.  Sedentary in the setting of chronic arthritic pain impacting his chest/back along w/ L foot amputation.   Social Determinants of Health   Financial Resource Strain: Not on file  Food Insecurity: Not on file  Transportation Needs: Not on file  Physical Activity: Not on file  Stress: Not on file  Social Connections: Not on file   Past Surgical History:  Procedure Laterality Date   ACHILLES TENDON SURGERY Left 03/28/2022   Procedure: ACHILLES LENGTHENING/KIDNER;  Surgeon: [name], DPM;  [facility];  Service: Podiatry;  Laterality: Left;   AMPUTATION Left 03/25/2022   Procedure: AMPUTATION DIGITS,TRANSMETARSAL AMPUTATION, REMOVAL OF TOES AND BONE BEHIND TOES ;  Surgeon: [name], DPM;  [facility];  Service: Podiatry;  Laterality: Left;   AMPUTATION Left 07/23/2022   Procedure: LEFT BELOW KNEE AMPUTATION;  Surgeon: [name], MD;  Location: [facility];  Service: Orthopedics;  Laterality: Left;   APPLICATION OF WOUND VAC Left 03/25/2022   Procedure: APPLICATION OF WOUND VAC;  Surgeon: [name], DPM;  [facility];  Service: Podiatry;  Laterality: Left;   CORONARY STENT INTERVENTION N/A 06/05/2022  Procedure: CORONARY STENT INTERVENTION;  Surgeon: Belva Crome, MD;  Location: [facility];  Service: Cardiovascular;  Laterality: N/A;   CYSTOSCOPY/RETROGRADE/URETEROSCOPY Left 10/29/2020   Procedure: CYSTOSCOPY/RETROGRADE/ LEFT URETEROSCOPY WITH BIOPSY, LEFT URETERAL STENT;  Surgeon: Raynelle Bring, MD;  [facility];  Service: Urology;  Laterality: Left;   IRRIGATION AND DEBRIDEMENT FOOT Left 03/28/2022   Procedure: AMPUTATION LISFRANC, SECONDARY WOUND CLOSURE;  Surgeon: [name], DPM;  [facility];  Service: Podiatry;  Laterality: Left;   KNEE SURGERY Bilateral 1975 and 1989   LEFT HEART CATH AND CORONARY ANGIOGRAPHY N/A 06/05/2022   Procedure: LEFT HEART CATH AND CORONARY ANGIOGRAPHY;   Surgeon: Belva Crome, MD;  Location: [facility];  Service: Cardiovascular;  Laterality: N/A;   MULTIPLE TOOTH EXTRACTIONS     ROBOT ASSITED LAPAROSCOPIC NEPHROURETERECTOMY Left 12/13/2020   Procedure: XI ROBOT ASSITED LAPAROSCOPIC NEPHROURETERECTOMY , POST OPERATIVE INTRAVESICAL GEMCITABINE;  Surgeon: Raynelle Bring, MD;  [facility];  Service: Urology;  Laterality: Left;  ONLY NEEDS 240 MIN   TOEM AMPUTATION  Left    2012, second toe   TRANSURETHRAL RESECTION OF BLADDER TUMOR N/A 08/29/2021   Procedure: TRANSURETHRAL RESECTION OF BLADDER TUMOR (TURBT) WITH CYSTOSCOPY;  Surgeon: Raynelle Bring, MD;  [facility];  Service: Urology;  Laterality: N/A;  GENERAL ANESTHESIA WITH PARALYSIS   WISDOM TOOTH EXTRACTION     Past Surgical History:  Procedure Laterality Date   ACHILLES TENDON SURGERY Left 03/28/2022   Procedure: ACHILLES LENGTHENING/KIDNER;  Surgeon: [name], DPM;  [facility];  Service: Podiatry;  Laterality: Left;   AMPUTATION Left 03/25/2022   Procedure: AMPUTATION DIGITS,TRANSMETARSAL AMPUTATION, REMOVAL OF TOES AND BONE BEHIND TOES ;  Surgeon: [name], DPM;  [facility];  Service: Podiatry;  Laterality: Left;   AMPUTATION Left 07/23/2022   Procedure: LEFT BELOW KNEE AMPUTATION;  Surgeon: [name], MD;  Location: [facility];  Service: Orthopedics;  Laterality: Left;   APPLICATION OF WOUND VAC Left 03/25/2022   Procedure: APPLICATION OF WOUND VAC;  Surgeon: [name], DPM;  [facility];  Service: Podiatry;  Laterality: Left;   CORONARY STENT INTERVENTION N/A 06/05/2022   Procedure: CORONARY STENT INTERVENTION;  Surgeon: Belva Crome, MD;  Location: [facility];  Service: Cardiovascular;  Laterality: N/A;   CYSTOSCOPY/RETROGRADE/URETEROSCOPY Left 10/29/2020   Procedure: CYSTOSCOPY/RETROGRADE/ LEFT URETEROSCOPY WITH BIOPSY, LEFT URETERAL STENT;  Surgeon: Raynelle Bring, MD;  [facility];  Service: Urology;  Laterality:  Left;   IRRIGATION AND DEBRIDEMENT FOOT Left 03/28/2022   Procedure: AMPUTATION LISFRANC, SECONDARY WOUND CLOSURE;  Surgeon: [name], DPM;  [facility];  Service: Podiatry;  Laterality: Left;   KNEE SURGERY Bilateral 1975 and 1989   LEFT HEART CATH AND CORONARY ANGIOGRAPHY N/A 06/05/2022   Procedure: LEFT HEART CATH AND CORONARY ANGIOGRAPHY;  Surgeon: Belva Crome, MD;  Location: [facility];  Service: Cardiovascular;  Laterality: N/A;   MULTIPLE TOOTH EXTRACTIONS     ROBOT ASSITED LAPAROSCOPIC NEPHROURETERECTOMY Left 12/13/2020   Procedure: XI ROBOT ASSITED LAPAROSCOPIC NEPHROURETERECTOMY , POST OPERATIVE INTRAVESICAL GEMCITABINE;  Surgeon: Raynelle Bring, MD;  [facility];  Service: Urology;  Laterality: Left;  ONLY NEEDS 240 MIN   TOEM AMPUTATION  Left    2012, second toe   TRANSURETHRAL RESECTION OF BLADDER TUMOR N/A 08/29/2021   Procedure: TRANSURETHRAL RESECTION OF BLADDER TUMOR (TURBT) WITH CYSTOSCOPY;  Surgeon: Raynelle Bring, MD;  [facility];  Service:  Urology;  Laterality: N/A;  GENERAL ANESTHESIA WITH PARALYSIS   WISDOM TOOTH EXTRACTION     Past Medical History:  Diagnosis Date   Allergy    Anemia    Anemia    Arthritis    ARTHRITIS IN SPINE - MAKES PT HAVE CHEST PAIN- USES fENTANYL PATCH    Asthma    IN TEENS    Bacteremia    a. 03/2022 Group B Strep bacteremia in setting of diabetic L foot infxn/gangrene/osteo.   CAD (coronary artery disease)    Chronic HFrEF (heart failure with reduced ejection fraction) ([place])    Diabetes mellitus    Diabetic peripheral neuropathy associated with type 2 diabetes mellitus ([place]) 12/12/2011   Fatty liver disease, nonalcoholic 53/61/4431   Foot osteomyelitis, left ([place])    a. 2012 s/p toe amputations on L; b. 03/2022 - admitted w/ wet gangrene and necrotizing infxn w/ osteomyelitis-->s/p transmetatarsal followed by Lisfranc amputation.   GERD (gastroesophageal reflux disease)    H/O degenerative disc disease     History of echocardiogram    a. 03/2022 Echo: EF 55-60%, no rwma, nl RV fxn, mildly elev PASP. Mild MR.   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([place])    There were no vitals taken for this visit.  Opioid Risk Score:   Fall Risk Score:  `1  Depression screen [facility] 2/9     08/14/2022   11:30 AM 04/30/2022    9:49 AM 01/06/2019    9:27 AM 05/24/2018   11:35 AM 02/01/2018   11:54 AM 10/10/2017   10:58 AM 06/18/2017   12:06 PM  Depression screen PHQ 2/9  Decreased Interest 0 3 0 0 0 0 0  Down, Depressed, Hopeless 0 1 0 0 0 0 0  PHQ - 2 Score 0 4 0 0 0 0 0  Altered sleeping 0 0       Tired, decreased energy 0 3       Change in appetite 0 1       Feeling bad or failure about yourself  0 0       Trouble concentrating 0 0       Moving slowly or fidgety/restless 0 0       Suicidal thoughts 0 0       PHQ-9 Score 0 8         Review of Systems  Respiratory:  Positive for chest tightness.   Musculoskeletal:  Positive for back pain.  All other systems reviewed and are negative.     Objective:   Physical Exam Vitals and nursing note reviewed.  Constitutional:      Appearance: Normal appearance.  Cardiovascular:     Rate and Rhythm: Normal rate and regular rhythm.     Pulses: Normal pulses.     Heart sounds: Normal heart sounds.  Pulmonary:     Effort: Pulmonary effort is normal.     Breath sounds: Normal breath sounds.  Musculoskeletal:     Cervical back: Normal range of motion and neck supple.     Comments: Normal Muscle Bulk and Muscle Testing Reveals:  Upper Extremities: Full ROM and Muscle Strength 5/5  Thoracic Paraspinal Tenderness: T-7-T-9 Lumbar Paraspinal Tenderness: L-4-L-5  Lower Extremities: Right: Full ROM and Muscle Strength 5/5 Left: BKA:  Arrived in wheelchair    Skin:    General: Skin is warm and dry.  Comments: Left BKA: Dressing Removed: Site cleansed: Staples OTA with bloody drainage and odor  noted. Dr Sharol Given Following. He is currently prescribed Doxy.   Neurological:     Mental Status: He is alert and oriented to person, place, and time.  Psychiatric:        Mood and Affect: Mood normal.        Behavior: Behavior normal.         Assessment & Plan:  S/P BKA left,: Has a scheduled appointment with Dr Sharol Given: Ortho Following. Continue to Monitor. 09/05/2022  Type 2 DM with long-term insulin use: Continue current medication: PCP Following, Continue to Monitor 09/05/2022 ,Essential Hypertension: Continue current medication regimen. PCP Following. Continue to Monitor. 09/05/2022  Hyperlipidemia.: Continue current medication regimen. PCP following. Continue to Monitor.  Chronic Costochondritis: Chest/ Back Pain Chronic Pain Syndrome: Refilled Fentanyl 65mg one patch every three days #10. We will continue the opioid monitoring program, this consists of regular clinic visits, examinations, urine drug screen, pill counts as well [place]. [place] 12 [place]  09/05/2022 Post-Operative Pain : Continue Gabapentin. Continue to monitor. Refilled: Continue slow weaning.  Oxycodone '5mg'$   one tablet 3 times a day as needed for pain # 90 :   Phantom Pain: Continue Gabapentin. Continue to Monitor.    F/U in 1 month

## 2022-09-09 ENCOUNTER — Encounter: Payer: Self-pay | Admitting: Orthopedic Surgery

## 2022-09-09 LAB — DRUG TOX MONITOR 1 W/CONF, ORAL FLD
Amphetamines: NEGATIVE ng/mL (ref <10)
Barbiturates: NEGATIVE ng/mL (ref <10)
Benzodiazepines: NEGATIVE ng/mL (ref <0.50)
Buprenorphine: NEGATIVE ng/mL (ref <0.10)
Cocaine: NEGATIVE ng/mL (ref <5.0)
Codeine: NEGATIVE ng/mL (ref <2.5)
Cotinine: >250 ng/mL — ABNORMAL HIGH (ref <5.0)
Dihydrocodeine: NEGATIVE ng/mL (ref <2.5)
Fentanyl: >25 ng/mL — ABNORMAL HIGH (ref <0.10)
Fentanyl: POSITIVE ng/mL — AB (ref <0.10)
Heroin Metabolite: NEGATIVE ng/mL (ref <1.0)
Hydrocodone: NEGATIVE ng/mL (ref <2.5)
Hydromorphone: NEGATIVE ng/mL (ref <2.5)
MARIJUANA: NEGATIVE ng/mL (ref <2.5)
MDMA: NEGATIVE ng/mL (ref <10)
Meprobamate: NEGATIVE ng/mL (ref <2.5)
Methadone: NEGATIVE ng/mL (ref <5.0)
Morphine: NEGATIVE ng/mL (ref <2.5)
Nicotine Metabolite: POSITIVE ng/mL — AB (ref <5.0)
Norhydrocodone: NEGATIVE ng/mL (ref <2.5)
Noroxycodone: 51.2 ng/mL — ABNORMAL HIGH (ref <2.5)
Opiates: POSITIVE ng/mL — AB (ref <2.5)
Oxycodone: >250 ng/mL — ABNORMAL HIGH (ref <2.5)
Phencyclidine: NEGATIVE ng/mL (ref <10)
Tapentadol: NEGATIVE ng/mL (ref <5.0)
Tramadol: NEGATIVE ng/mL (ref <5.0)
Zolpidem: NEGATIVE ng/mL (ref <5.0)

## 2022-09-09 LAB — DRUG TOX ALC METAB W/CON, ORAL FLD: Alcohol Metabolite: NEGATIVE ng/mL (ref <25)

## 2022-09-09 NOTE — Progress Notes (Signed)
Office Visit Note   Patient: Robert Burnett           Date of Birth: 1961/03/16           MRN: [id number] Visit Date: 08/26/2022              Requested by: Charlott Rakes, MD [place],  [place] [id number] PCP: Charlott Rakes, MD  Chief Complaint  Patient presents with   Left Leg - Routine Post Op    07/23/2022 left BKA with kerecis graft       HPI: Patient is a 61 year old gentleman who is 4 weeks status post left transtibial amputation with Kerecis reinforcement soft tissue graft.  Assessment & Plan: Visit Diagnoses:  1. Below-knee amputation of left lower extremity ([place])     Plan: Patient has had a reaction to Bactrim DS will call in a prescription for doxycycline.  Follow-Up Instructions: Return in about 1 week (around 09/02/2022).   Ortho Exam  Patient is alert, oriented, no adenopathy, well-dressed, normal affect, normal respiratory effort. Examination there is an area of redness 5 cm in diameter with clear serosanguineous drainage no purulent drainage no ascending cellulitis.  There is slight wound dehiscence from swelling.  Imaging: No results found.      Labs: Lab Results  Component Value Date   HGBA1C 6.8 (H) 07/22/2022   HGBA1C 14.2 (H) 03/25/2022   HGBA1C 11.2 (H) 08/23/2021   ESRSEDRATE 124 (H) 07/22/2022   ESRSEDRATE 115 (H) 03/25/2022   ESRSEDRATE 4 06/01/2014   CRP 26.5 (H) 07/22/2022   CRP 21.4 (H) 03/25/2022   CRP <0.5 06/01/2014   REPTSTATUS 07/27/2022 FINAL 07/22/2022   GRAMSTAIN  03/25/2022    NO SQUAMOUS EPITHELIAL CELLS SEEN FEW WBC SEEN FEW GRAM POSITIVE RODS FEW GRAM NEGATIVE RODS FEW GRAM POSITIVE COCCI Performed at [facility], [place] [address], [place], [place] [address]    CULT  07/22/2022    NO GROWTH 5 DAYS Performed at [facility] [address], [place], [place] [address]    LABORGA PROTEUS VULGARIS 03/25/2022     Lab Results  Component Value Date   ALBUMIN 3.6 (L)  08/27/2022   ALBUMIN 1.8 (L) 07/26/2022   ALBUMIN 2.3 (L) 07/21/2022   PREALBUMIN 5 (L) 07/23/2022   PREALBUMIN <5 (L) 03/25/2022    Lab Results  Component Value Date   MG 1.7 07/26/2022   MG 1.9 06/05/2022   MG 1.6 (L) 06/04/2022   Lab Results  Component Value Date   VD25OH 17.42 (L) 07/28/2022    Lab Results  Component Value Date   PREALBUMIN 5 (L) 07/23/2022   PREALBUMIN <5 (L) 03/25/2022      Latest Ref Rng & Units 08/27/2022   10:13 AM 08/21/2022    3:41 PM 08/05/2022   10:28 AM  CBC EXTENDED  WBC 3.4 - 10.8 x10E3/uL 4.5  4.0  5.3   RBC 4.14 - 5.80 x10E6/uL 3.36  3.24  2.73   Hemoglobin 13.0 - 17.7 g/dL 8.9  8.6  7.3   HCT 37.5 - 51.0 % 29.1  28.0  22.1   Platelets 150 - 450 x10E3/uL 135  135  221      There is no height or weight on file to calculate BMI.  Orders:  No orders of the defined types were placed in this encounter.  Meds ordered this encounter  Medications   doxycycline (VIBRA-TABS) 100 MG tablet    Sig: Take  1 tablet (100 mg total) by mouth 2 (two) times daily.    Dispense:  30 tablet    Refill:  0     Procedures: No procedures performed  Clinical Data: No additional findings.  ROS:  All other systems negative, except as noted in the HPI. Review of Systems  Objective: Vital Signs: There were no vitals taken for this visit.  Specialty Comments:  No specialty comments available.  PMFS History: Patient Active Problem List   Diagnosis Date Noted   S/P BKA (below knee amputation) unilateral, left ([place])    Osteomyelitis of left lower extremity ([place]) 07/25/2022   Cutaneous abscess of left ankle 07/23/2022   Cellulitis of left foot    Foot osteomyelitis, left ([place]) 07/22/2022   CAD S/P percutaneous coronary angioplasty 07/22/2022   Ischemic cardiomyopathy 07/22/2022   CKD (chronic kidney disease) stage 2, GFR 60-89 ml/min 07/22/2022   Normocytic anemia 07/22/2022   Tobacco abuse 07/22/2022   PAF (paroxysmal atrial fibrillation)  ([place]) 07/22/2022   Hyponatremia 07/22/2022   NSTEMI (non-ST elevated myocardial infarction) ([place]) 06/04/2022   Type 2 diabetes mellitus with complication, with long-term current use of insulin ([place]) 06/04/2022   Hypomagnesemia 06/04/2022   Hyperlipidemia 06/04/2022   Diabetic ulcer of left foot ([place]) 06/04/2022   History of transmetatarsal amputation of foot ([place]) 04/30/2022   Gangrene of left foot ([place]) 03/25/2022   Hypokalemia 03/25/2022   Severe protein-calorie malnutrition ([place]) 03/25/2022   Pyogenic inflammation of bone ([facility])    Necrotizing soft tissue infection    Abscess of tendon sheath, left ankle and foot    Lateral epicondylitis of left elbow 08/01/2021   Ureteral cancer, 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   Proteinuria 09/19/2018   Hematuria 09/19/2018   Anterior cervical adenopathy 09/19/2018   Fatty liver disease, nonalcoholic 20/25/4270   Hypertension 06/27/2016   Thoracic spondylosis with myelopathy 03/16/2013   Carpal tunnel syndrome 03/16/2013   Chest pain 03/16/2013   Type 2 diabetes mellitus with left diabetic foot infection ([place]) 03/16/2013   Debility 03/16/2013   Chronic pain syndrome 03/16/2013   Lumbar degenerative disc disease 12/12/2011   Costochondritis 12/12/2011   Diabetic peripheral neuropathy associated with type 2 diabetes mellitus ([place]) 12/12/2011   Past Medical History:  Diagnosis Date   Allergy    Anemia    Anemia    Arthritis    ARTHRITIS IN SPINE - MAKES PT HAVE CHEST PAIN- USES fENTANYL PATCH    Asthma    IN TEENS    Bacteremia    a. 03/2022 Group B Strep bacteremia in setting of diabetic L foot infxn/gangrene/osteo.   CAD (coronary artery disease)    Chronic HFrEF (heart failure with reduced ejection fraction) ([place])    Diabetes mellitus    Diabetic peripheral neuropathy associated with type 2 diabetes mellitus ([place]) 12/12/2011   Fatty liver disease, nonalcoholic 62/37/6283   Foot osteomyelitis, left ([place])    a. 2012 s/p toe amputations on L; b.  03/2022 - admitted w/ wet gangrene and necrotizing infxn w/ osteomyelitis-->s/p transmetatarsal followed by Lisfranc amputation.   GERD (gastroesophageal reflux disease)    H/O degenerative disc disease    History of echocardiogram    a. 03/2022 Echo: EF 55-60%, no rwma, nl RV fxn, mildly elev PASP. Mild MR.   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([place])     Family History  Problem Relation Age of  Onset   Cancer Mother    Hypertension Father    Diabetes Father    Lung cancer Father    Diabetes Sister    Cancer Sister     Past Surgical History:  Procedure Laterality Date   ACHILLES TENDON SURGERY Left 03/28/2022   Procedure: ACHILLES LENGTHENING/KIDNER;  Surgeon: [name], DPM;  [facility];  Service: Podiatry;  Laterality: Left;   AMPUTATION Left 03/25/2022   Procedure: AMPUTATION DIGITS,TRANSMETARSAL AMPUTATION, REMOVAL OF TOES AND BONE BEHIND TOES ;  Surgeon: [name], DPM;  [facility];  Service: Podiatry;  Laterality: Left;   AMPUTATION Left 07/23/2022   Procedure: LEFT BELOW KNEE AMPUTATION;  Surgeon: [name], MD;  Location: [facility];  Service: Orthopedics;  Laterality: Left;   APPLICATION OF WOUND VAC Left 03/25/2022   Procedure: APPLICATION OF WOUND VAC;  Surgeon: [name], DPM;  [facility];  Service: Podiatry;  Laterality: Left;   CORONARY STENT INTERVENTION N/A 06/05/2022   Procedure: CORONARY STENT INTERVENTION;  Surgeon: Belva Crome, MD;  Location: [facility];  Service: Cardiovascular;  Laterality: N/A;   CYSTOSCOPY/RETROGRADE/URETEROSCOPY Left 10/29/2020   Procedure: CYSTOSCOPY/RETROGRADE/ LEFT URETEROSCOPY WITH BIOPSY, LEFT URETERAL STENT;  Surgeon: Raynelle Bring, MD;  [facility];  Service: Urology;  Laterality: Left;   IRRIGATION AND DEBRIDEMENT FOOT Left 03/28/2022   Procedure: AMPUTATION LISFRANC, SECONDARY WOUND CLOSURE;  Surgeon: [name], DPM;   [facility];  Service: Podiatry;  Laterality: Left;   KNEE SURGERY Bilateral 1975 and 1989   LEFT HEART CATH AND CORONARY ANGIOGRAPHY N/A 06/05/2022   Procedure: LEFT HEART CATH AND CORONARY ANGIOGRAPHY;  Surgeon: Belva Crome, MD;  Location: [facility];  Service: Cardiovascular;  Laterality: N/A;   MULTIPLE TOOTH EXTRACTIONS     ROBOT ASSITED LAPAROSCOPIC NEPHROURETERECTOMY Left 12/13/2020   Procedure: XI ROBOT ASSITED LAPAROSCOPIC NEPHROURETERECTOMY , POST OPERATIVE INTRAVESICAL GEMCITABINE;  Surgeon: Raynelle Bring, MD;  [facility];  Service: Urology;  Laterality: Left;  ONLY NEEDS 240 MIN   TOEM AMPUTATION  Left    2012, second toe   TRANSURETHRAL RESECTION OF BLADDER TUMOR N/A 08/29/2021   Procedure: TRANSURETHRAL RESECTION OF BLADDER TUMOR (TURBT) WITH CYSTOSCOPY;  Surgeon: Raynelle Bring, MD;  [facility];  Service: Urology;  Laterality: N/A;  GENERAL ANESTHESIA WITH PARALYSIS   WISDOM TOOTH EXTRACTION     Social History   Occupational History   Not on file  Tobacco Use   Smoking status: Every Day    Packs/day: 2.00    Years: 5.00    Total pack years: 10.00    Types: Pipe, Cigarettes   Smokeless tobacco: Former    Types: Chew   Tobacco comments:    Using pipe until about a week ago (05/2022)  Vaping Use   Vaping Use: Never used  Substance and Sexual Activity   Alcohol use: No   Drug use: No   Sexual activity: Not on file

## 2022-09-10 ENCOUNTER — Ambulatory Visit (INDEPENDENT_AMBULATORY_CARE_PROVIDER_SITE_OTHER): Payer: Commercial Managed Care - HMO | Admitting: Family

## 2022-09-10 ENCOUNTER — Emergency Department
Admission: EM | Admit: 2022-09-10 | Discharge: 2022-09-10 | Disposition: A | Discharge status: Home / Self Care | Payer: Commercial Managed Care - HMO | Attending: Student in an Organized Health Care Education/Training Program | Admitting: Student in an Organized Health Care Education/Training Program

## 2022-09-10 ENCOUNTER — Other Ambulatory Visit: Payer: Self-pay

## 2022-09-10 DIAGNOSIS — E119 Type 2 diabetes mellitus without complications: Secondary | ICD-10-CM | POA: Diagnosis not present

## 2022-09-10 DIAGNOSIS — N182 Chronic kidney disease, stage 2 (mild): Secondary | ICD-10-CM | POA: Diagnosis not present

## 2022-09-10 DIAGNOSIS — W260XXA Contact with knife, initial encounter: Secondary | ICD-10-CM | POA: Insufficient documentation

## 2022-09-10 DIAGNOSIS — S6992XA Unspecified injury of left wrist, hand and finger(s), initial encounter: Secondary | ICD-10-CM | POA: Diagnosis present

## 2022-09-10 DIAGNOSIS — S61211A Laceration without foreign body of left index finger without damage to nail, initial encounter: Secondary | ICD-10-CM | POA: Insufficient documentation

## 2022-09-10 DIAGNOSIS — Z23 Encounter for immunization: Secondary | ICD-10-CM | POA: Insufficient documentation

## 2022-09-10 DIAGNOSIS — Z7901 Long term (current) use of anticoagulants: Secondary | ICD-10-CM | POA: Insufficient documentation

## 2022-09-10 DIAGNOSIS — S88112A Complete traumatic amputation at level between knee and ankle, left lower leg, initial encounter: Secondary | ICD-10-CM

## 2022-09-10 DIAGNOSIS — Z89511 Acquired absence of right leg below knee: Secondary | ICD-10-CM

## 2022-09-10 MED ORDER — OXYCODONE-ACETAMINOPHEN 5-325 MG PO TABS
1.0000 | ORAL_TABLET | Freq: Once | ORAL | Status: AC
  Administered 2022-09-10: 1 via ORAL
  Filled 2022-09-10: qty 1

## 2022-09-10 MED ORDER — CEPHALEXIN 500 MG PO CAPS: 1000.0000 mg | ORAL_CAPSULE | Freq: Two times a day (BID) | ORAL | 0 refills | Status: AC

## 2022-09-10 MED ORDER — LIDOCAINE-EPINEPHRINE (PF) 2 %-1:200000 IJ SOLN
10.0000 mL | Freq: Once | INTRAMUSCULAR | Status: AC
  Administered 2022-09-10: 10 mL
  Filled 2022-09-10: qty 20

## 2022-09-10 MED ORDER — TETANUS-DIPHTH-ACELL PERTUSSIS 5-2.5-18.5 LF-MCG/0.5 IM SUSY
0.5000 mL | PREFILLED_SYRINGE | Freq: Once | INTRAMUSCULAR | Status: AC
  Administered 2022-09-10: 0.5 mL via INTRAMUSCULAR
  Filled 2022-09-10: qty 0.5

## 2022-09-10 NOTE — Discharge Instructions (Addendum)
-  Please take the full course of the antibiotics as prescribed.  -You will need to have the sutures removed in 10 days.  This can be done at [facility], urgent care, or here.  -Return to the emergency department anytime if you begin to experience any new or worsening symptoms.

## 2022-09-10 NOTE — ED Notes (Addendum)
Pt in with lac to L hand; states knife was just cleaned right before he accidentally cut hand; cut on palm where fingers meet palm; gauze at site; blood currently oozing from site. Pt reports has sensation to hand and all fingers currently; states intermittent numbness to L middle finger at tip.

## 2022-09-10 NOTE — ED Notes (Signed)
[place] vial to provider BS.

## 2022-09-10 NOTE — ED Provider Notes (Signed)
[facility] Provider Note    Event Date/Time   First MD Initiated Contact with Patient 09/10/22 2001     (approximate)   History   Chief Complaint Laceration   HPI Robert Burnett is a 61 y.o. male, history of type 2 diabetes, prior NSTEMI, CKD stage II, paroxysmal atrial fibrillation, fatty liver disease, presents to the emergency department for evaluation of laceration.  Patient states that he was trying to cut off Saran wrap that was around his finger earlier today with a kitchen knife when he accidentally cut the medial aspect of his left index finger.  He does take Eliquis.  Bleeding was controlled with direct pressure on scene.  He states that overall he feels well.  Denies any other injuries at this time.  Denies numbness/tingling in the affected finger or cold sensation.  He is unsure if he is up-to-date on his tetanus.  History Limitations: No limitations.        Physical Exam  Triage Vital Signs: ED Triage Vitals  Enc Vitals Group     BP 09/10/22 1950 (!) 157/83     Pulse Rate 09/10/22 1950 73     Resp 09/10/22 1950 16     Temp 09/10/22 1950 98.3 F (36.8 C)     Temp Source 09/10/22 1950 Oral     SpO2 09/10/22 1950 98 %     Weight 09/10/22 1951 180 lb (81.6 kg)     Height 09/10/22 1951 '6\' 1"'$  (1.854 m)     Head Circumference --      Peak Flow --      Pain Score 09/10/22 1951 1     Pain Loc --      Pain Edu? --      Excl. in [place]? --     Most recent vital signs: Vitals:   09/10/22 2036 09/10/22 2145  BP: (!) 155/74 (!) 154/68  Pulse: 63 66  Resp: 16 16  Temp:  98.2 F (36.8 C)  SpO2: 99% 98%    General: Awake, NAD.  Skin: Warm, dry. No rashes or lesions.  Eyes: PERRL. Conjunctivae normal.  CV: Good peripheral perfusion.  Resp: Normal effort.  Abd: Soft, non-tender. No distention.  Neuro: At baseline. No gross neurological deficits.  Musculoskeletal: Normal ROM of all extremities.  Focused Exam: 2 cm linear laceration  along the medial aspect of the left index finger, proximal to the PIP joint.  Mild bleeding noted at this time.  Patient maintains full range of motion of the finger, including flexion and extension at the PIP and DIP joints.  Normal cap refill distally.  Sensation intact distally.  No foreign bodies present.  Physical Exam    ED Results / Procedures / Treatments  Labs (all labs ordered are listed, but only abnormal results are displayed) Labs Reviewed - No data to display   EKG N/A.    RADIOLOGY  ED Provider Interpretation: N/A.  No results found.  PROCEDURES:  Critical Care performed: N/A.  Marland Kitchen.Laceration Repair  Date/Time: 09/10/2022 11:32 PM  Performed by: Teodoro Spray, PA Authorized by: Teodoro Spray, PA   Consent:    Consent obtained:  Verbal   Consent given by:  Patient   Risks, benefits, and alternatives were discussed: yes     Risks discussed:  Need for additional repair, nerve damage, pain, infection, tendon damage, vascular damage, poor cosmetic result, poor wound healing and retained foreign body   Alternatives discussed:  No treatment Universal  protocol:    Patient identity confirmed:  Verbally with patient Anesthesia:    Anesthesia method:  Local infiltration   Local anesthetic:  Lidocaine 2% WITH epi Laceration details:    Location:  Finger   Finger location:  L index finger   Length (cm):  2   Depth (mm):  2 Pre-procedure details:    Preparation:  Patient was prepped and draped in usual sterile fashion Exploration:    Hemostasis achieved with:  Direct pressure   Wound extent: no foreign body, no signs of injury, no nerve damage, no tendon damage, no underlying fracture and no vascular damage     Contaminated: no   Treatment:    Area cleansed with:  Saline   Amount of cleaning:  Extensive   Irrigation solution:  Sterile saline   Irrigation volume:  1000 ml   Irrigation method:  Pressure wash   Debridement:  None   Undermining:   None Skin repair:    Repair method:  Sutures   Suture size:  5-0   Suture material:  Nylon   Suture technique:  Simple interrupted   Number of sutures:  5 Approximation:    Approximation:  Close Repair type:    Repair type:  Simple Post-procedure details:    Dressing:  Sterile dressing and tube gauze   Procedure completion:  Tolerated well, no immediate complications     MEDICATIONS ORDERED IN ED: Medications  Tdap (BOOSTRIX) injection 0.5 mL (0.5 mLs Intramuscular Given 09/10/22 2031)  oxyCODONE-acetaminophen (PERCOCET/ROXICET) 5-325 MG per tablet 1 tablet (1 tablet Oral Given 09/10/22 2030)  lidocaine-EPINEPHrine (XYLOCAINE W/EPI) 2 %-1:200000 (PF) injection 10 mL (10 mLs Infiltration Given by Other 09/10/22 2040)     IMPRESSION / MDM / [place] / ED COURSE  I reviewed the triage vital signs and the nursing notes.                              Differential diagnosis includes, but is not limited to, finger laceration, flexor tendon injury, tetanus, foreign body.  Assessment/Plan Patient presents with 2 cm linear laceration along the medial aspect of the left index finger.  Bleeding controlled direct pressure.  Provide him with a tetanus booster.  Wound was irrigated extensively.  I was able to successfully repair the wound utilizing 5 simple interrupted sutures.  Patient tolerated the procedure well with no immediate complications.  See above for details.  We will provide him with a prescription for cephalexin for infection prevention.  Encouraged him to limit range of motion activity with the finger to prevent suture rupture.  Recommend that he take Tylenol/ibuprofen as needed for pain.  Advised him that he will need to have the sutures removed in 10 days.  He was amenable to this.  Will discharge.  Provided the patient with anticipatory guidance, return precautions, and educational material. Encouraged the patient to return to the emergency department at any time if  they begin to experience any new or worsening symptoms. Patient expressed understanding and agreed with the plan.   Patient's presentation is most consistent with acute, uncomplicated illness.       FINAL CLINICAL IMPRESSION(S) / ED DIAGNOSES   Final diagnoses:  Laceration of left index finger without foreign body without damage to nail, initial encounter     Rx / DC Orders   ED Discharge Orders          Ordered    cephALEXin (KEFLEX)  500 MG capsule  2 times daily        09/10/22 2130             Note:  This document was prepared using Dragon voice recognition software and may include unintentional dictation errors.   Teodoro Spray, [place] 09/10/22 [id number]    Merlyn Lot, MD 09/10/22 [phone]

## 2022-09-10 NOTE — ED Notes (Signed)
First nurse note: From home [facility] Pt had saran wrap stuck on finger, tried to cut off with kitchen knife, cut self by accident, 2cm laceration noted by EMS to L index finger, blood oozing slowly, takes Eliquis. Currently bandaged. EMS VS: 190/100, 74 pulse, 97% on RA.

## 2022-09-10 NOTE — ED Triage Notes (Signed)
Pt comes from home via [facility] c/o laceration on pointer finger of left hand. Pt states he had saran wrap on finger and tried to cut it off with knife, but cut finger. Pt takes eliquis. Bleeding controlled at this time, lac wrapped in gauze. NAD at this time.

## 2022-09-16 ENCOUNTER — Other Ambulatory Visit: Payer: Self-pay | Admitting: Physician Assistant

## 2022-09-16 ENCOUNTER — Encounter: Payer: Self-pay | Admitting: Family

## 2022-09-16 ENCOUNTER — Ambulatory Visit (HOSPITAL_COMMUNITY): Admission: RE | Admit: 2022-09-16 | Payer: Medicaid Other | Source: Ambulatory Visit

## 2022-09-16 ENCOUNTER — Encounter (HOSPITAL_COMMUNITY): Payer: Self-pay

## 2022-09-16 DIAGNOSIS — R0989 Other specified symptoms and signs involving the circulatory and respiratory systems: Secondary | ICD-10-CM

## 2022-09-16 DIAGNOSIS — I480 Paroxysmal atrial fibrillation: Secondary | ICD-10-CM

## 2022-09-16 DIAGNOSIS — I2510 Atherosclerotic heart disease of native coronary artery without angina pectoris: Secondary | ICD-10-CM

## 2022-09-16 DIAGNOSIS — I10 Essential (primary) hypertension: Secondary | ICD-10-CM

## 2022-09-16 DIAGNOSIS — E785 Hyperlipidemia, unspecified: Secondary | ICD-10-CM

## 2022-09-16 DIAGNOSIS — I5022 Chronic systolic (congestive) heart failure: Secondary | ICD-10-CM

## 2022-09-16 NOTE — Progress Notes (Signed)
Post-Op Visit Note   Patient: Robert Burnett           Date of Birth: 11-May-1961           MRN: [id number] Visit Date: 09/10/2022 PCP: Charlott Rakes, MD  Chief Complaint:  Chief Complaint  Patient presents with   Left Leg - Routine Post Op    07/23/2022 left BKA kerecis     HPI:  HPI The patient is a 61 year old gentleman who is seen status post left below-knee amputation with Kerecis placement on October 4. He has completed a course of doxycycline and is concerned for an area of continued serosanguineous drainage.  He does also take Eliquis. Ortho Exam On examination of the left residual limb centrally he has a 1 cm in diameter open ulceration this does probe to bone.  There is moderate active drainage. No surrounding erythema.  No ascending cellulitis.  Visit Diagnoses: No diagnosis found.  Plan: discussed Revision transtibial amputation on the left due to dehiscence and exposed bone.  Patient is in agreement with the plan.  He will continue on his oral antibiotics we will set this up at his convenience.  Discussed return precautions.  Follow-Up Instructions: Return post op.   Imaging: No results found.  Orders:  No orders of the defined types were placed in this encounter.  No orders of the defined types were placed in this encounter.    PMFS History: Patient Active Problem List   Diagnosis Date Noted   S/P BKA (below knee amputation) unilateral, left ([place])    Osteomyelitis of left lower extremity ([place]) 07/25/2022   Cutaneous abscess of left ankle 07/23/2022   Cellulitis of left foot    Foot osteomyelitis, left ([place]) 07/22/2022   CAD S/P percutaneous coronary angioplasty 07/22/2022   Ischemic cardiomyopathy 07/22/2022   CKD (chronic kidney disease) stage 2, GFR 60-89 ml/min 07/22/2022   Normocytic anemia 07/22/2022   Tobacco abuse 07/22/2022   PAF (paroxysmal atrial fibrillation) ([place]) 07/22/2022   Hyponatremia 07/22/2022   NSTEMI (non-ST elevated  myocardial infarction) ([place]) 06/04/2022   Type 2 diabetes mellitus with complication, with long-term current use of insulin ([place]) 06/04/2022   Hypomagnesemia 06/04/2022   Hyperlipidemia 06/04/2022   Diabetic ulcer of left foot ([place]) 06/04/2022   History of transmetatarsal amputation of foot ([place]) 04/30/2022   Gangrene of left foot ([place]) 03/25/2022   Hypokalemia 03/25/2022   Severe protein-calorie malnutrition ([place]) 03/25/2022   Pyogenic inflammation of bone ([place])    Necrotizing soft tissue infection    Abscess of tendon sheath, left ankle and foot    Lateral epicondylitis of left elbow 08/01/2021   Ureteral cancer, 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   Proteinuria 09/19/2018   Hematuria 09/19/2018   Anterior cervical adenopathy 09/19/2018   Fatty liver disease, nonalcoholic 20/25/4270   Hypertension 06/27/2016   Thoracic spondylosis with myelopathy 03/16/2013   Carpal tunnel syndrome 03/16/2013   Chest pain 03/16/2013   Type 2 diabetes mellitus with left diabetic foot infection ([place]) 03/16/2013   Debility 03/16/2013   Chronic pain syndrome 03/16/2013   Lumbar degenerative disc disease 12/12/2011   Costochondritis 12/12/2011   Diabetic peripheral neuropathy associated with type 2 diabetes mellitus ([place]) 12/12/2011   Past Medical History:  Diagnosis Date   Allergy    Anemia    Anemia    Arthritis    ARTHRITIS IN SPINE - MAKES PT HAVE CHEST PAIN- USES fENTANYL PATCH    Asthma    IN TEENS    Bacteremia  a. 03/2022 Group B Strep bacteremia in setting of diabetic L foot infxn/gangrene/osteo.   CAD (coronary artery disease)    Chronic HFrEF (heart failure with reduced ejection fraction) ([place])    Diabetes mellitus    Diabetic peripheral neuropathy associated with type 2 diabetes mellitus ([place]) 12/12/2011   Fatty liver disease, nonalcoholic 04/18/1600   Foot osteomyelitis, left ([place])    a. 2012 s/p toe amputations on L; b. 03/2022 - admitted w/ wet gangrene and necrotizing infxn w/  osteomyelitis-->s/p transmetatarsal followed by Lisfranc amputation.   GERD (gastroesophageal reflux disease)    H/O degenerative disc disease    History of echocardiogram    a. 03/2022 Echo: EF 55-60%, no rwma, nl RV fxn, mildly elev PASP. Mild MR.   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([place])     Family History  Problem Relation Age of Onset   Cancer Mother    Hypertension Father    Diabetes Father    Lung cancer Father    Diabetes Sister    Cancer Sister     Past Surgical History:  Procedure Laterality Date   ACHILLES TENDON SURGERY Left 03/28/2022   Procedure: ACHILLES LENGTHENING/KIDNER;  Surgeon: [name], DPM;  Location: [facility];  Service: Podiatry;  Laterality: Left;   AMPUTATION Left 03/25/2022   Procedure: AMPUTATION DIGITS,TRANSMETARSAL AMPUTATION, REMOVAL OF TOES AND BONE BEHIND TOES ;  Surgeon: [name], DPM;  [facility]: [facility];  Service: Podiatry;  Laterality: Left;   AMPUTATION Left 07/23/2022   Procedure: LEFT BELOW KNEE AMPUTATION;  Surgeon: [name], MD;  Location: [facility];  Service: Orthopedics;  Laterality: Left;   APPLICATION OF WOUND VAC Left 03/25/2022   Procedure: APPLICATION OF WOUND VAC;  Surgeon: [name], DPM;  [facility];  Service: Podiatry;  Laterality: Left;   CORONARY STENT INTERVENTION N/A 06/05/2022   Procedure: CORONARY STENT INTERVENTION;  Surgeon: Belva Crome, MD;  Location: [facility];  Service: Cardiovascular;  Laterality: N/A;   CYSTOSCOPY/RETROGRADE/URETEROSCOPY Left 10/29/2020   Procedure: CYSTOSCOPY/RETROGRADE/ LEFT URETEROSCOPY WITH BIOPSY, LEFT URETERAL STENT;  Surgeon: Raynelle Bring, MD;  [facility];  Service: Urology;  Laterality: Left;   IRRIGATION AND DEBRIDEMENT FOOT Left 03/28/2022   Procedure: AMPUTATION LISFRANC, SECONDARY WOUND CLOSURE;  Surgeon: [name], DPM;  [facility];  Service: Podiatry;  Laterality: Left;   KNEE  SURGERY Bilateral 1975 and 1989   LEFT HEART CATH AND CORONARY ANGIOGRAPHY N/A 06/05/2022   Procedure: LEFT HEART CATH AND CORONARY ANGIOGRAPHY;  Surgeon: Belva Crome, MD;  Location: [facility];  Service: Cardiovascular;  Laterality: N/A;   MULTIPLE TOOTH EXTRACTIONS     ROBOT ASSITED LAPAROSCOPIC NEPHROURETERECTOMY Left 12/13/2020   Procedure: XI ROBOT ASSITED LAPAROSCOPIC NEPHROURETERECTOMY , POST OPERATIVE INTRAVESICAL GEMCITABINE;  Surgeon: Raynelle Bring, MD;  [facility];  Service: Urology;  Laterality: Left;  ONLY NEEDS 240 MIN   TOEM AMPUTATION  Left    2012, second toe   TRANSURETHRAL RESECTION OF BLADDER TUMOR N/A 08/29/2021   Procedure: TRANSURETHRAL RESECTION OF BLADDER TUMOR (TURBT) WITH CYSTOSCOPY;  Surgeon: Raynelle Bring, MD;  [facility];  Service: Urology;  Laterality: N/A;  GENERAL ANESTHESIA WITH PARALYSIS   WISDOM TOOTH EXTRACTION     Social History   Occupational History   Not on file  Tobacco Use   Smoking status: Every  Day    Packs/day: 2.00    Years: 5.00    Total pack years: 10.00    Types: Pipe, Cigarettes   Smokeless tobacco: Former    Types: Chew   Tobacco comments:    Using pipe until about a week ago (05/2022)  Vaping Use   Vaping Use: Never used  Substance and Sexual Activity   Alcohol use: No   Drug use: No   Sexual activity: Not on file

## 2022-09-18 NOTE — Progress Notes (Signed)
Late add on for surgery tomorrow. Attempted to call pt, no answer and voicemail is full. Called [name] May (cousin) on [place] list and no answer and no voicemail available. Called Cherice May (Cousin) on [place] list and no answer, but was able to leave pre-op instructions on her voicemail.   Laterality is different in consent order and OR schedule than from note on 09/10/22.

## 2022-09-19 NOTE — H&P (Signed)
Robert Burnett is an 61 y.o. male.   Chief Complaint: Dehiscence left transtibial amputation HPI: The patient is a 61 year old gentleman who is seen status post left below-knee amputation with Kerecis placement on October 4. He has completed a course of doxycycline and is concerned for an area of continued serosanguineous drainage.  He does also take Eliquis.  Past Medical History:  Diagnosis Date   Allergy    Anemia    Anemia    Arthritis    ARTHRITIS IN SPINE - MAKES PT HAVE CHEST PAIN- USES fENTANYL PATCH    Asthma    IN TEENS    Bacteremia    a. 03/2022 Group B Strep bacteremia in setting of diabetic L foot infxn/gangrene/osteo.   CAD (coronary artery disease)    Chronic HFrEF (heart failure with reduced ejection fraction) ([place])    Diabetes mellitus    Diabetic peripheral neuropathy associated with type 2 diabetes mellitus ([place]) 12/12/2011   Fatty liver disease, nonalcoholic 75/07/2584   Foot osteomyelitis, left ([place])    a. 2012 s/p toe amputations on L; b. 03/2022 - admitted w/ wet gangrene and necrotizing infxn w/ osteomyelitis-->s/p transmetatarsal followed by Lisfranc amputation.   GERD (gastroesophageal reflux disease)    H/O degenerative disc disease    History of echocardiogram    a. 03/2022 Echo: EF 55-60%, no rwma, nl RV fxn, mildly elev PASP. Mild MR.   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[facility])     Past Surgical History:  Procedure Laterality Date   ACHILLES TENDON SURGERY Left 03/28/2022   Procedure: ACHILLES LENGTHENING/KIDNER;  Surgeon: [name], DPM;  [facility];  Service: Podiatry;  Laterality: Left;   AMPUTATION Left 03/25/2022   Procedure: AMPUTATION DIGITS,TRANSMETARSAL AMPUTATION, REMOVAL OF TOES AND BONE BEHIND TOES ;  Surgeon: [name], DPM;  [facility];  Service: Podiatry;  Laterality: Left;   AMPUTATION Left 07/23/2022   Procedure: LEFT BELOW KNEE AMPUTATION;  Surgeon:  [name], MD;  Location: [facility];  Service: Orthopedics;  Laterality: Left;   APPLICATION OF WOUND VAC Left 03/25/2022   Procedure: APPLICATION OF WOUND VAC;  Surgeon: [name], DPM;  [facility];  Service: Podiatry;  Laterality: Left;   CORONARY STENT INTERVENTION N/A 06/05/2022   Procedure: CORONARY STENT INTERVENTION;  Surgeon: Belva Crome, MD;  Location: [facility];  Service: Cardiovascular;  Laterality: N/A;   CYSTOSCOPY/RETROGRADE/URETEROSCOPY Left 10/29/2020   Procedure: CYSTOSCOPY/RETROGRADE/ LEFT URETEROSCOPY WITH BIOPSY, LEFT URETERAL STENT;  Surgeon: Raynelle Bring, MD;  [facility];  Service: Urology;  Laterality: Left;   IRRIGATION AND DEBRIDEMENT FOOT Left 03/28/2022   Procedure: AMPUTATION LISFRANC, SECONDARY WOUND CLOSURE;  Surgeon: [name], DPM;  [facility];  Service: Podiatry;  Laterality: Left;   KNEE SURGERY Bilateral 1975 and 1989   LEFT HEART CATH AND CORONARY ANGIOGRAPHY N/A 06/05/2022   Procedure: LEFT HEART CATH AND CORONARY ANGIOGRAPHY;  Surgeon: Belva Crome, MD;  Location: [facility];  Service: Cardiovascular;  Laterality: N/A;   MULTIPLE TOOTH EXTRACTIONS     ROBOT ASSITED LAPAROSCOPIC NEPHROURETERECTOMY Left 12/13/2020   Procedure: XI ROBOT ASSITED LAPAROSCOPIC NEPHROURETERECTOMY , POST OPERATIVE INTRAVESICAL GEMCITABINE;  Surgeon: Raynelle Bring, MD;  [facility];  Service: Urology;  Laterality: Left;  ONLY NEEDS 240 MIN   TOEM AMPUTATION  Left    2012,  second toe   TRANSURETHRAL RESECTION OF BLADDER TUMOR N/A 08/29/2021   Procedure: TRANSURETHRAL RESECTION OF BLADDER TUMOR (TURBT) WITH CYSTOSCOPY;  Surgeon: Raynelle Bring, MD;  [facility];  Service: Urology;  Laterality: N/A;  GENERAL ANESTHESIA WITH PARALYSIS   WISDOM TOOTH EXTRACTION      Family History  Problem Relation Age of Onset   Cancer Mother    Hypertension Father    Diabetes Father    Lung cancer Father    Diabetes Sister    Cancer  Sister    Social History:  reports that he has been smoking pipe and cigarettes. He has a 10.00 pack-year smoking history. He has quit using smokeless tobacco.  His smokeless tobacco use included chew. He reports that he does not drink alcohol and does not use drugs.  Allergies:  Allergies  Allergen Reactions   Amitriptyline Hcl     Confusion   Baclofen    Cymbalta [Duloxetine Hcl]     confusion   Amlodipine Rash   Prozac [Fluoxetine Hcl] Rash    No medications prior to admission.    No results found for this or any previous visit (from the past 48 hour(s)). No results found.  Review of Systems  All other systems reviewed and are negative.   There were no vitals taken for this visit. Physical Exam  On examination patient has dehiscence of the left transtibial amputation.  There is exposed bone there is no purulent drainage there is an ischemic ulcer 1 cm diameter. Assessment/Plan Assessment: Dehiscence left transtibial amputation.  Plan.  Will plan for revision of the left transtibial amputation due to exposed bone and necrotic ulcer.  Risk and benefits were discussed including infection neurovascular injury recurrent dehiscence need for additional surgery.  Patient states he understands wished to proceed at this time.  [name], MD 09/19/2022, 7:14 AM

## 2022-09-22 ENCOUNTER — Telehealth: Payer: Self-pay

## 2022-09-22 NOTE — Telephone Encounter (Signed)
Pt is sch for revision BKA on Wednesday just making you aware brown drainage from BKA no temp, no pain good PO intake no other  s/s of infection.

## 2022-09-22 NOTE — Telephone Encounter (Signed)
Patient called stating that he had some brown drainage coming from his left stump.  Stated that he feels fine, no fever, eating/drinking okay, and left stump feels fine too.  Cb# [phone].  Please advise.  Thank you.

## 2022-09-23 ENCOUNTER — Encounter (HOSPITAL_COMMUNITY): Payer: Self-pay | Admitting: Orthopedic Surgery

## 2022-09-23 NOTE — Progress Notes (Signed)
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  Patient hospitalized August 2023 for NSTEMI. Cath showed 99% prox LAD tx with DES. Echo 06/05/22 showed LVEF 40-45%, Severe hypokinesis of the left ventricular, mid-apical anteroseptal wall, anterior wall and apical segment, mild MR. Cardiology notes also report documentation of afib during a prior 03/2022 admission - due to recurrence during 05/2022 admission, was started on Eliquis. He was discharged on triple therapy with ASA + Plavix + Eliquis with plan to stop ASA after 1 month. He was recently admitted 10/6-10/19/23 for osteomyelitis requiring L BKA, also required blood transfusion due to ABL anemia during that admission.   He was seen by cardiology APP Sharrell Ku, PA-C 08/21/2022 for posthospitalization follow-up.  He was noted to be in NSR at that time and overall doing well from cardiac standpoint.  Per note, "CAD, Hyperlipidemia - doing well without recurrent angina. No longer on ASA due to dual therapy with Plavix and Eliquis. Continue statin, ezetimibe. Consolidate Toprol to once daily dosing. He has eaten pizza today. Will obtain fasting CMET/lipids when he returns for his echo."  Repeat echo 08/27/2022 showed normalization of LV function with EF 55 to 60%, no significant valvular abnormalities.  14-monthfollow-up was recommended.  He also has a AAA duplex ultrasound ordered, this is still pending.  CMP and CBC 08/27/2022 reviewed, moderate anemia with hemoglobin 8.9, platelets mildly low at 135, otherwise  unremarkable.  Patient will need day of surgery evaluation.  EKG 07/22/2022: Sinus rhythm.  Rate 93.  RSR'in V1 or V2, right VCD or RVH.  TTE 08/27/22:  1. Limited study; all images not obtained; LV function has normalized  compared to 06/05/22.   2. Left ventricular ejection fraction, by estimation, is 55 to 60%. The  left ventricle has normal function. The left ventricle has no regional  wall motion abnormalities. Left ventricular diastolic parameters were  normal.   3. Right ventricular systolic function is normal. The right ventricular  size is normal. Tricuspid regurgitation signal is inadequate for assessing  PA pressure.   4. The mitral valve is normal in structure. Mild mitral valve  regurgitation.   5. The aortic valve is tricuspid. Aortic valve regurgitation is not  visualized. No aortic stenosis is present.   6. The inferior vena cava is normal in size with greater than 50%  respiratory variability, suggesting right atrial pressure of 3 mmHg.   Cath and PCI 06/05/22: CONCLUSIONS:  99% proximal LAD treated with overlapping Synergy stents dilated to 3.5 mm in diameter (3.5 x 16 proximal and overlapped distally with a 3.0 x 12).  TIMI grade II flow increased to TIMI grade III flow postprocedure.  Very distal stent margin has persistent eccentric 30 to 40% narrowing.  Left main widely patent  Circumflex gives origin to 4 obtuse marginals.  The third obtuse marginal is large and free of obstruction.  Right coronary is dominant and contains proximal 25% narrowing.  Left ventricular angiography by hand-injection demonstrated severe anteroapical hypokinesis with EF in the 35% range.  Ongoing post procedural pain without acute EKG changes.  Does have anterior T wave inversion as he did  prior to the procedure.  Also states that he has had chronic back pain that causes chest discomfort. RECOMMENDATIONS:  IV nitroglycerin until pain controlled.  Aspirin and Brilinta for 12  months.  Distinguish whether ongoing chest discomfort is thoracic spine versus ongoing ischemia.  Suspect thoracic pain from disc disease.    Wynonia Musty [facility] Short Stay Center/Anesthesiology Phone [phone] 09/23/2022 10:28 AM

## 2022-09-23 NOTE — Anesthesia Preprocedure Evaluation (Deleted)
Anesthesia Evaluation    Airway        Dental   Pulmonary Current Smoker          Cardiovascular hypertension,      Neuro/Psych    GI/Hepatic   Endo/Other  diabetes    Renal/GU      Musculoskeletal   Abdominal   Peds  Hematology   Anesthesia Other Findings   Reproductive/Obstetrics                              Anesthesia Physical Anesthesia Plan  ASA:   Anesthesia Plan:    Post-op Pain Management:    Induction:   PONV Risk Score and Plan:   Airway Management Planned:   Additional Equipment:   Intra-op Plan:   Post-operative Plan:   Informed Consent:   Plan Discussed with:   Anesthesia Plan Comments: (See PAT note by Karoline Caldwell, PA-C:  Pertinent history includes CAD (NSTEMI 05/2022 s/p DES to LAD), chronic HFrEF, PAF, HTN, HLD, IDDM 2 (A1c 6.8 on 07/22/2022), mild mitral regurgitation, diabetic foot ulcers with osteomyelitis and gangrene s/p transmetatarsal amputation 03/2022 and 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  Patient hospitalized August 2023 for NSTEMI. Cath showed 99% prox LAD tx with DES. Echo 06/05/22 showed LVEF 40-45%, Severe hypokinesis of the left ventricular, mid-apical anteroseptal wall, anterior wall and apical segment, mild MR. Cardiology notes also report documentation of afib during a prior 03/2022 admission - due to recurrence during 05/2022 admission, was started on Eliquis. He was discharged on triple therapy with ASA + Plavix + Eliquis with plan to stop ASA after 1 month. He was recently admitted 10/6-10/19/23 for osteomyelitis requiring L BKA, also required blood transfusion due to ABL anemia during that admission.   He was seen by cardiology APP Sharrell Ku, PA-C 08/21/2022 for posthospitalization follow-up.   He was noted to be in NSR at that time and overall doing well from cardiac standpoint.  Per note, "CAD, Hyperlipidemia - doing well without recurrent angina. No longer on ASA due to dual therapy with Plavix and Eliquis. Continue statin, ezetimibe. Consolidate Toprol to once daily dosing. He has eaten pizza today. Will obtain fasting CMET/lipids when he returns for his echo."  Repeat echo 08/27/2022 showed normalization of LV function with EF 55 to 60%, no significant valvular abnormalities.  67-monthfollow-up was recommended.  He also has a AAA duplex ultrasound ordered, this is still pending.  CMP and CBC 08/27/2022 reviewed, moderate anemia with hemoglobin 8.9, platelets mildly low at 135, otherwise unremarkable.  Patient will need day of surgery evaluation.  EKG 07/22/2022: Sinus rhythm.  Rate 93.  RSR'in V1 or V2, right VCD or RVH.  TTE 08/27/22:  1. Limited study; all images not obtained; LV function has normalized  compared to 06/05/22.   2. Left ventricular ejection fraction, by estimation, is 55 to 60%. The  left ventricle has normal function. The left ventricle has no regional  wall motion abnormalities. Left ventricular diastolic parameters were  normal.   3. Right ventricular systolic function is normal. The right ventricular  size is normal. Tricuspid regurgitation signal is inadequate for assessing  PA pressure.   4. The mitral valve is normal in structure. Mild mitral valve  regurgitation.   5. The aortic valve is tricuspid. Aortic valve regurgitation is not  visualized. No aortic stenosis is present.   6. The inferior vena cava is normal in size with greater than 50%  respiratory variability, suggesting right atrial pressure of 3 mmHg.   Cath and PCI 06/05/22: CONCLUSIONS:  99% proximal LAD treated with overlapping Synergy stents dilated to 3.5 mm in diameter (3.5 x 16 proximal and overlapped distally with a 3.0 x 12).  TIMI grade II flow increased to TIMI grade III flow  postprocedure.  Very distal stent margin has persistent eccentric 30 to 40% narrowing.  Left main widely patent  Circumflex gives origin to 4 obtuse marginals.  The third obtuse marginal is large and free of obstruction.  Right coronary is dominant and contains proximal 25% narrowing.  Left ventricular angiography by hand-injection demonstrated severe anteroapical hypokinesis with EF in the 35% range.  Ongoing post procedural pain without acute EKG changes.  Does have anterior T wave inversion as he did prior to the procedure.  Also states that he has had chronic back pain that causes chest discomfort. RECOMMENDATIONS:  IV nitroglycerin until pain controlled.  Aspirin and Brilinta for 12 months.  Distinguish whether ongoing chest discomfort is thoracic spine versus ongoing ischemia.  Suspect thoracic pain from disc disease.    )         Anesthesia Quick Evaluation

## 2022-09-23 NOTE — Progress Notes (Signed)
Numerous calls were made to Robert Burnett, voice mail is full. I called Robert Burnett a emergency contact, vm full , I called and spoke with patient''s nephew, Robert Burnett.  Robert Burnett said he will be`going to patient's house later today and he will have patient call.Patient did call and leave a voice message, I called Robert Burnett and he answered.  Robert Burnett denies chest pain or shortness of breath.  Patient denies having any s/s of Covid in his household, also denies any known exposure to Covid.    Robert Burnett has type II diabetes, I instructed patient to hold [place] tonight and in am.  Patient has a order for  70/30 Insulin 2 times a day, patient does not take it, I only take it if I need it, My CBG has Been running 120's, I am not going to take it.  I instructed patient to check CBG after awaking and every 2 hours until arrival  to the hospital.  I Instructed patient if CBG is less than 70 to take 4 Glucose Tablets or 1 tube of Glucose Gel or 1/2 cup of a clear juice. Recheck CBG in 15 minutes if CBG is not over 70 call, pre- op desk at [phone] for further instructions. If scheduled to receive Insulin, do not take Insulin.  Robert Burnett is on Plavix, Med record states he is on Eliquis, patient states he never has taken it. I asked Robert Burnett to hold Plavix in am.

## 2022-09-23 NOTE — Anesthesia Preprocedure Evaluation (Signed)
Anesthesia Evaluation  Patient identified by MRN, date of birth, ID band Patient awake    Reviewed: Allergy & Precautions, NPO status , Patient's Chart, lab work & pertinent test results  History of Anesthesia Complications Negative for: history of anesthetic complications  Airway Mallampati: II  TM Distance: >3 FB Neck ROM: Full    Dental  (+) Poor Dentition, Dental Advisory Given   Pulmonary asthma , Current Smoker and Patient abstained from smoking.   Pulmonary exam normal        Cardiovascular hypertension, Pt. on home beta blockers and Pt. on medications + CAD, + Past MI and + Cardiac Stents  Normal cardiovascular exam     Neuro/Psych  Neuromuscular disease    GI/Hepatic Neg liver ROS,GERD  Medicated,,  Endo/Other  diabetes, Type 2, Oral Hypoglycemic Agents, Insulin Dependent    Renal/GU Renal InsufficiencyRenal disease     Musculoskeletal  (+) Arthritis ,  Osteomyelitis Left Foot   Abdominal   Peds  Hematology  (+) Blood dyscrasia (Eliquis)   Anesthesia Other Findings Day of surgery medications reviewed with the patient.  Reproductive/Obstetrics                             Anesthesia Physical Anesthesia Plan  ASA: 4  Anesthesia Plan: MAC and Regional   Post-op Pain Management: Regional block* and Tylenol PO (pre-op)*   Induction: Intravenous  PONV Risk Score and Plan: 0 and TIVA, Treatment may vary due to age or medical condition and Ondansetron  Airway Management Planned: Natural Airway and Simple Face Mask  Additional Equipment:   Intra-op Plan:   Post-operative Plan:   Informed Consent: I have reviewed the patients History and Physical, chart, labs and discussed the procedure including the risks, benefits and alternatives for the proposed anesthesia with the patient or authorized representative who has indicated his/her understanding and acceptance.     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  Patient hospitalized August 2023 for NSTEMI. Cath showed 99% prox LAD tx with DES. Echo8/17/23 showed LVEF 40-45%,Severe hypokinesis of the left ventricular, mid-apical anteroseptal wall, anterior wall and apical segment, mild MR. Cardiology notes also report documentation of afib during a prior 03/2022 admission - due to recurrence during 05/2022 admission, was started on Eliquis. He was discharged on triple therapy with ASA + Plavix + Eliquis with plan to stop ASA after 1 month. He was recently admitted 10/6-10/19/23 for osteomyelitis requiring L BKA, also required blood transfusion due to ABL anemia during that admission.   He was seen by cardiology APP Sharrell Ku, PA-C 08/21/2022 for posthospitalization follow-up.  He was noted to be in NSR at that time and overall doing well from cardiac standpoint.  Per note, "CAD,Hyperlipidemia- doing well without recurrent angina. No longer on ASA due to dual therapy with Plavix and Eliquis. Continue statin, ezetimibe. Consolidate Toprol to once daily dosing. He has eaten pizza today. Will obtain fasting CMET/lipids when he returns for his echo."  Repeat echo 08/27/2022 showed normalization of  LV function with EF 55 to 60%, no significant valvular abnormalities.  70-monthfollow-up was recommended.  He also has a AAA duplex ultrasound ordered, this is still pending.  CMP and CBC 08/27/2022 reviewed,  moderate anemia with hemoglobin 8.9, platelets mildly low at 135, otherwise unremarkable.  Patient will need day of surgery evaluation.  EKG 07/22/2022: Sinus rhythm.  Rate 93.  RSR'in V1 or V2, right VCD or RVH.  TTE 08/27/22: 1. Limited study; all images not obtained; LV function has normalized  compared to 06/05/22.  2. Left ventricular ejection fraction, by estimation, is 55 to 60%. The  left ventricle has normal function. The left ventricle has no regional  wall motion abnormalities. Left ventricular diastolic parameters were  normal.  3. Right ventricular systolic function is normal. The right ventricular  size is normal. Tricuspid regurgitation signal is inadequate for assessing  PA pressure.  4. The mitral valve is normal in structure. Mild mitral valve  regurgitation.  5. The aortic valve is tricuspid. Aortic valve regurgitation is not  visualized. No aortic stenosis is present.  6. The inferior vena cava is normal in size with greater than 50%  respiratory variability, suggesting right atrial pressure of 3 mmHg.   Cath and PCI 06/05/22: CONCLUSIONS:  99% proximal LAD treated with overlapping Synergy stents dilated to 3.5 mm in diameter (3.5 x 16 proximal and overlapped distally with a 3.0 x 12).  TIMI grade II flow increased to TIMI grade III flow postprocedure.  Very distal stent margin has persistent eccentric 30 to 40% narrowing.  Left main widely patent  Circumflex gives origin to 4 obtuse marginals.  The third obtuse marginal is large and free of obstruction.  Right coronary is dominant and contains proximal 25% narrowing.  Left ventricular angiography by hand-injection demonstrated severe anteroapical hypokinesis with EF in the 35% range.  Ongoing post procedural pain without acute EKG changes.  Does have anterior T wave inversion as he did prior to the procedure.  Also states that he has had chronic back pain that causes chest discomfort. RECOMMENDATIONS:  IV  nitroglycerin until pain controlled.  Aspirin and Brilinta for 12 months.  Distinguish whether ongoing chest discomfort is thoracic spine versus ongoing ischemia.  Suspect thoracic pain from disc disease. )        Anesthesia Quick Evaluation

## 2022-09-24 ENCOUNTER — Inpatient Hospital Stay (HOSPITAL_COMMUNITY)
Admission: AD | Admit: 2022-09-24 | Discharge: 2022-09-26 | DRG: 464 | Disposition: A | Discharge status: Home / Self Care | Payer: Medicaid Other | Attending: Orthopedic Surgery | Admitting: Orthopedic Surgery

## 2022-09-24 ENCOUNTER — Other Ambulatory Visit: Payer: Self-pay

## 2022-09-24 ENCOUNTER — Ambulatory Visit (HOSPITAL_COMMUNITY): Payer: Medicaid Other | Admitting: General Practice

## 2022-09-24 ENCOUNTER — Encounter (HOSPITAL_COMMUNITY): Payer: Self-pay | Admitting: Orthopedic Surgery

## 2022-09-24 ENCOUNTER — Encounter (HOSPITAL_COMMUNITY)
Admission: AD | Disposition: A | Discharge status: Home / Self Care | Payer: Self-pay | Source: Home / Self Care | Attending: Orthopedic Surgery

## 2022-09-24 DIAGNOSIS — Z7984 Long term (current) use of oral hypoglycemic drugs: Secondary | ICD-10-CM

## 2022-09-24 DIAGNOSIS — I252 Old myocardial infarction: Secondary | ICD-10-CM

## 2022-09-24 DIAGNOSIS — I5022 Chronic systolic (congestive) heart failure: Secondary | ICD-10-CM | POA: Diagnosis present

## 2022-09-24 DIAGNOSIS — Z794 Long term (current) use of insulin: Secondary | ICD-10-CM

## 2022-09-24 DIAGNOSIS — E785 Hyperlipidemia, unspecified: Secondary | ICD-10-CM | POA: Diagnosis present

## 2022-09-24 DIAGNOSIS — I110 Hypertensive heart disease with heart failure: Secondary | ICD-10-CM | POA: Diagnosis present

## 2022-09-24 DIAGNOSIS — F17210 Nicotine dependence, cigarettes, uncomplicated: Secondary | ICD-10-CM | POA: Diagnosis present

## 2022-09-24 DIAGNOSIS — E1142 Type 2 diabetes mellitus with diabetic polyneuropathy: Secondary | ICD-10-CM | POA: Diagnosis present

## 2022-09-24 DIAGNOSIS — Z89512 Acquired absence of left leg below knee: Secondary | ICD-10-CM

## 2022-09-24 DIAGNOSIS — I10 Essential (primary) hypertension: Secondary | ICD-10-CM

## 2022-09-24 DIAGNOSIS — T8781 Dehiscence of amputation stump: Principal | ICD-10-CM | POA: Diagnosis present

## 2022-09-24 DIAGNOSIS — I2510 Atherosclerotic heart disease of native coronary artery without angina pectoris: Secondary | ICD-10-CM | POA: Diagnosis not present

## 2022-09-24 DIAGNOSIS — Z8249 Family history of ischemic heart disease and other diseases of the circulatory system: Secondary | ICD-10-CM

## 2022-09-24 DIAGNOSIS — D638 Anemia in other chronic diseases classified elsewhere: Secondary | ICD-10-CM | POA: Diagnosis present

## 2022-09-24 DIAGNOSIS — K219 Gastro-esophageal reflux disease without esophagitis: Secondary | ICD-10-CM | POA: Diagnosis present

## 2022-09-24 DIAGNOSIS — Z8589 Personal history of malignant neoplasm of other organs and systems: Secondary | ICD-10-CM

## 2022-09-24 DIAGNOSIS — I480 Paroxysmal atrial fibrillation: Secondary | ICD-10-CM | POA: Diagnosis present

## 2022-09-24 DIAGNOSIS — Z955 Presence of coronary angioplasty implant and graft: Secondary | ICD-10-CM

## 2022-09-24 DIAGNOSIS — Y835 Amputation of limb(s) as the cause of abnormal reaction of the patient, or of later complication, without mention of misadventure at the time of the procedure: Secondary | ICD-10-CM | POA: Diagnosis present

## 2022-09-24 DIAGNOSIS — Z833 Family history of diabetes mellitus: Secondary | ICD-10-CM

## 2022-09-24 DIAGNOSIS — K760 Fatty (change of) liver, not elsewhere classified: Secondary | ICD-10-CM | POA: Diagnosis present

## 2022-09-24 DIAGNOSIS — F17290 Nicotine dependence, other tobacco product, uncomplicated: Secondary | ICD-10-CM | POA: Diagnosis present

## 2022-09-24 DIAGNOSIS — E119 Type 2 diabetes mellitus without complications: Secondary | ICD-10-CM

## 2022-09-24 DIAGNOSIS — Z801 Family history of malignant neoplasm of trachea, bronchus and lung: Secondary | ICD-10-CM

## 2022-09-24 HISTORY — DX: Personal history of other medical treatment: Z92.89

## 2022-09-24 HISTORY — PX: STUMP REVISION: SHX6102

## 2022-09-24 HISTORY — DX: Depression, unspecified: F32.A

## 2022-09-24 LAB — GLUCOSE, CAPILLARY
Glucose-Capillary: 115 mg/dL — ABNORMAL HIGH (ref 70–99)
Glucose-Capillary: 122 mg/dL — ABNORMAL HIGH (ref 70–99)
Glucose-Capillary: 118 mg/dL — ABNORMAL HIGH (ref 70–99)
Glucose-Capillary: 114 mg/dL — ABNORMAL HIGH (ref 70–99)
Glucose-Capillary: 200 mg/dL — ABNORMAL HIGH (ref 70–99)

## 2022-09-24 LAB — SURGICAL PCR SCREEN
MRSA, PCR: NEGATIVE
Staphylococcus aureus: NEGATIVE

## 2022-09-24 LAB — CBC
HCT: 33.4 % — ABNORMAL LOW (ref 39.0–52.0)
Hemoglobin: 10.4 g/dL — ABNORMAL LOW (ref 13.0–17.0)
MCH: 25.6 pg — ABNORMAL LOW (ref 26.0–34.0)
MCHC: 31.1 g/dL (ref 30.0–36.0)
MCV: 82.3 fL (ref 80.0–100.0)
Platelets: 174 10*3/uL (ref 150–400)
RBC: 4.06 MIL/uL — ABNORMAL LOW (ref 4.22–5.81)
RDW: 15 % (ref 11.5–15.5)
WBC: 5.9 10*3/uL (ref 4.0–10.5)
nRBC: 0 % (ref 0.0–0.2)

## 2022-09-24 SURGERY — REVISION, AMPUTATION SITE
Anesthesia: Monitor Anesthesia Care | Laterality: Left

## 2022-09-24 MED ORDER — EZETIMIBE 10 MG PO TABS
10.0000 mg | ORAL_TABLET | Freq: Every day | ORAL | Status: DC
  Administered 2022-09-24 – 2022-09-26 (×3): 10 mg via ORAL
  Filled 2022-09-24 (×3): qty 1

## 2022-09-24 MED ORDER — FINASTERIDE 5 MG PO TABS
5.0000 mg | ORAL_TABLET | Freq: Every day | ORAL | Status: DC
  Administered 2022-09-24 – 2022-09-26 (×3): 5 mg via ORAL
  Filled 2022-09-24 (×3): qty 1

## 2022-09-24 MED ORDER — OXYCODONE HCL 5 MG PO TABS
10.0000 mg | ORAL_TABLET | ORAL | Status: DC | PRN
  Administered 2022-09-24: 10 mg via ORAL
  Administered 2022-09-25 (×2): 15 mg via ORAL
  Administered 2022-09-25: 10 mg via ORAL
  Filled 2022-09-24 (×2): qty 2
  Filled 2022-09-24 (×2): qty 3

## 2022-09-24 MED ORDER — CHLORHEXIDINE GLUCONATE 0.12 % MT SOLN
OROMUCOSAL | Status: AC
  Administered 2022-09-24: 15 mL
  Filled 2022-09-24: qty 15

## 2022-09-24 MED ORDER — CEFAZOLIN SODIUM-DEXTROSE 2-4 GM/100ML-% IV SOLN
2.0000 g | INTRAVENOUS | Status: AC
  Administered 2022-09-24: 2 g via INTRAVENOUS
  Filled 2022-09-24: qty 100

## 2022-09-24 MED ORDER — LABETALOL HCL 5 MG/ML IV SOLN: 10.0000 mg | INTRAVENOUS | Status: DC | PRN

## 2022-09-24 MED ORDER — FENTANYL CITRATE (PF) 100 MCG/2ML IJ SOLN
INTRAMUSCULAR | Status: AC
  Filled 2022-09-24: qty 2

## 2022-09-24 MED ORDER — FENTANYL CITRATE (PF) 100 MCG/2ML IJ SOLN
INTRAMUSCULAR | Status: AC
  Administered 2022-09-24: 200 ug via INTRAVENOUS
  Filled 2022-09-24: qty 2

## 2022-09-24 MED ORDER — MAGNESIUM SULFATE 2 GM/50ML IV SOLN: 2.0000 g | Freq: Every day | INTRAVENOUS | Status: DC | PRN

## 2022-09-24 MED ORDER — METOPROLOL TARTRATE 5 MG/5ML IV SOLN: 2.0000 mg | INTRAVENOUS | Status: DC | PRN

## 2022-09-24 MED ORDER — BUPIVACAINE HCL (PF) 0.5 % IJ SOLN
INTRAMUSCULAR | Status: DC | PRN
  Administered 2022-09-24: 15 mL via PERINEURAL
  Administered 2022-09-24: 10 mL via PERINEURAL

## 2022-09-24 MED ORDER — FENTANYL CITRATE (PF) 100 MCG/2ML IJ SOLN
25.0000 ug | INTRAMUSCULAR | Status: DC | PRN
  Administered 2022-09-24 (×2): 50 ug via INTRAVENOUS

## 2022-09-24 MED ORDER — FENTANYL 75 MCG/HR TD PT72
1.0000 | MEDICATED_PATCH | TRANSDERMAL | Status: DC
  Filled 2022-09-24: qty 1

## 2022-09-24 MED ORDER — PHENOL 1.4 % MT LIQD: 1.0000 | OROMUCOSAL | Status: DC | PRN

## 2022-09-24 MED ORDER — INSULIN ASPART 100 UNIT/ML IJ SOLN
0.0000 [IU] | Freq: Three times a day (TID) | INTRAMUSCULAR | Status: DC
  Administered 2022-09-25: 2 [IU] via SUBCUTANEOUS
  Administered 2022-09-25: 5 [IU] via SUBCUTANEOUS

## 2022-09-24 MED ORDER — PROPOFOL 500 MG/50ML IV EMUL
INTRAVENOUS | Status: DC | PRN
  Administered 2022-09-24: 150 ug/kg/min via INTRAVENOUS

## 2022-09-24 MED ORDER — PROMETHAZINE HCL 25 MG/ML IJ SOLN: 6.2500 mg | INTRAMUSCULAR | Status: DC | PRN

## 2022-09-24 MED ORDER — PANTOPRAZOLE SODIUM 40 MG PO TBEC: 40.0000 mg | DELAYED_RELEASE_TABLET | Freq: Every day | ORAL | Status: DC

## 2022-09-24 MED ORDER — MIDAZOLAM HCL 2 MG/2ML IJ SOLN
INTRAMUSCULAR | Status: AC
  Filled 2022-09-24: qty 2

## 2022-09-24 MED ORDER — AMISULPRIDE (ANTIEMETIC) 5 MG/2ML IV SOLN: 10.0000 mg | Freq: Once | INTRAVENOUS | Status: DC | PRN

## 2022-09-24 MED ORDER — HYDROMORPHONE HCL 1 MG/ML IJ SOLN
0.2500 mg | INTRAMUSCULAR | Status: DC | PRN
  Administered 2022-09-24 (×4): 0.5 mg via INTRAVENOUS

## 2022-09-24 MED ORDER — BISACODYL 5 MG PO TBEC: 5.0000 mg | DELAYED_RELEASE_TABLET | Freq: Every day | ORAL | Status: DC | PRN

## 2022-09-24 MED ORDER — POTASSIUM CHLORIDE CRYS ER 20 MEQ PO TBCR: 20.0000 meq | EXTENDED_RELEASE_TABLET | Freq: Every day | ORAL | Status: DC | PRN

## 2022-09-24 MED ORDER — HYDRALAZINE HCL 20 MG/ML IJ SOLN: 5.0000 mg | INTRAMUSCULAR | Status: DC | PRN

## 2022-09-24 MED ORDER — ACETAMINOPHEN 500 MG PO TABS
1000.0000 mg | ORAL_TABLET | Freq: Once | ORAL | Status: AC
  Administered 2022-09-24: 1000 mg via ORAL
  Filled 2022-09-24: qty 2

## 2022-09-24 MED ORDER — MIDAZOLAM HCL 2 MG/2ML IJ SOLN: 4.0000 mg | Freq: Once | INTRAMUSCULAR | Status: AC

## 2022-09-24 MED ORDER — CEFAZOLIN SODIUM-DEXTROSE 2-4 GM/100ML-% IV SOLN
2.0000 g | Freq: Three times a day (TID) | INTRAVENOUS | Status: DC
  Administered 2022-09-24 – 2022-09-26 (×5): 2 g via INTRAVENOUS
  Filled 2022-09-24 (×5): qty 100

## 2022-09-24 MED ORDER — INSULIN ASPART 100 UNIT/ML IJ SOLN: 0.0000 [IU] | INTRAMUSCULAR | Status: DC | PRN

## 2022-09-24 MED ORDER — FENTANYL CITRATE (PF) 100 MCG/2ML IJ SOLN: 200.0000 ug | Freq: Once | INTRAMUSCULAR | Status: AC

## 2022-09-24 MED ORDER — JUVEN PO PACK
1.0000 | PACK | Freq: Two times a day (BID) | ORAL | Status: DC
  Administered 2022-09-25 – 2022-09-26 (×3): 1 via ORAL
  Filled 2022-09-24 (×3): qty 1

## 2022-09-24 MED ORDER — ACETAMINOPHEN 325 MG PO TABS: 325.0000 mg | ORAL_TABLET | Freq: Four times a day (QID) | ORAL | Status: DC | PRN

## 2022-09-24 MED ORDER — ALUM & MAG HYDROXIDE-SIMETH 200-200-20 MG/5ML PO SUSP: 15.0000 mL | ORAL | Status: DC | PRN

## 2022-09-24 MED ORDER — FUROSEMIDE 40 MG PO TABS: 40.0000 mg | ORAL_TABLET | Freq: Every day | ORAL | Status: DC | PRN

## 2022-09-24 MED ORDER — DAPAGLIFLOZIN PROPANEDIOL 10 MG PO TABS
10.0000 mg | ORAL_TABLET | Freq: Every day | ORAL | Status: DC
  Administered 2022-09-25 – 2022-09-26 (×2): 10 mg via ORAL
  Filled 2022-09-24 (×2): qty 1

## 2022-09-24 MED ORDER — METOPROLOL SUCCINATE ER 25 MG PO TB24
25.0000 mg | ORAL_TABLET | Freq: Three times a day (TID) | ORAL | Status: DC
  Administered 2022-09-24 – 2022-09-26 (×6): 25 mg via ORAL
  Filled 2022-09-24 (×6): qty 1

## 2022-09-24 MED ORDER — ONDANSETRON HCL 4 MG/2ML IJ SOLN
INTRAMUSCULAR | Status: DC | PRN
  Administered 2022-09-24: 4 mg via INTRAVENOUS

## 2022-09-24 MED ORDER — 0.9 % SODIUM CHLORIDE (POUR BTL) OPTIME
TOPICAL | Status: DC | PRN
  Administered 2022-09-24: 1000 mL

## 2022-09-24 MED ORDER — ONDANSETRON HCL 4 MG/2ML IJ SOLN: 4.0000 mg | Freq: Four times a day (QID) | INTRAMUSCULAR | Status: DC | PRN

## 2022-09-24 MED ORDER — SODIUM CHLORIDE 0.9 % IV SOLN: INTRAVENOUS | Status: DC

## 2022-09-24 MED ORDER — PROPOFOL 10 MG/ML IV BOLUS
INTRAVENOUS | Status: AC
  Filled 2022-09-24: qty 20

## 2022-09-24 MED ORDER — NICOTINE 7 MG/24HR TD PT24
7.0000 mg | MEDICATED_PATCH | Freq: Every day | TRANSDERMAL | Status: DC
  Administered 2022-09-25 – 2022-09-26 (×2): 7 mg via TRANSDERMAL
  Filled 2022-09-24 (×2): qty 1

## 2022-09-24 MED ORDER — INSULIN ASPART 100 UNIT/ML IJ SOLN
4.0000 [IU] | Freq: Three times a day (TID) | INTRAMUSCULAR | Status: DC
  Administered 2022-09-25 (×2): 4 [IU] via SUBCUTANEOUS

## 2022-09-24 MED ORDER — POLYETHYLENE GLYCOL 3350 17 G PO PACK: 17.0000 g | PACK | Freq: Every day | ORAL | Status: DC | PRN

## 2022-09-24 MED ORDER — HYDROMORPHONE HCL 1 MG/ML IJ SOLN
0.5000 mg | INTRAMUSCULAR | Status: DC | PRN
  Administered 2022-09-24 – 2022-09-25 (×2): 1 mg via INTRAVENOUS
  Filled 2022-09-24 (×2): qty 1

## 2022-09-24 MED ORDER — CLOPIDOGREL BISULFATE 75 MG PO TABS
75.0000 mg | ORAL_TABLET | Freq: Every day | ORAL | Status: DC
  Administered 2022-09-24 – 2022-09-26 (×3): 75 mg via ORAL
  Filled 2022-09-24 (×3): qty 1

## 2022-09-24 MED ORDER — ZINC SULFATE 220 (50 ZN) MG PO CAPS
220.0000 mg | ORAL_CAPSULE | Freq: Every day | ORAL | Status: DC
  Administered 2022-09-24 – 2022-09-26 (×3): 220 mg via ORAL
  Filled 2022-09-24 (×3): qty 1

## 2022-09-24 MED ORDER — PANTOPRAZOLE SODIUM 40 MG PO TBEC
40.0000 mg | DELAYED_RELEASE_TABLET | Freq: Every day | ORAL | Status: DC
  Administered 2022-09-25 – 2022-09-26 (×2): 40 mg via ORAL
  Filled 2022-09-24 (×2): qty 1

## 2022-09-24 MED ORDER — DOCUSATE SODIUM 100 MG PO CAPS
100.0000 mg | ORAL_CAPSULE | Freq: Every day | ORAL | Status: DC
  Administered 2022-09-25 – 2022-09-26 (×2): 100 mg via ORAL
  Filled 2022-09-24 (×2): qty 1

## 2022-09-24 MED ORDER — VITAMIN C 500 MG PO TABS
1000.0000 mg | ORAL_TABLET | Freq: Every day | ORAL | Status: DC
  Administered 2022-09-25 – 2022-09-26 (×2): 1000 mg via ORAL
  Filled 2022-09-24 (×2): qty 2

## 2022-09-24 MED ORDER — OXYCODONE HCL 5 MG PO TABS
5.0000 mg | ORAL_TABLET | ORAL | Status: DC | PRN
  Administered 2022-09-25 (×3): 5 mg via ORAL
  Administered 2022-09-26 (×2): 10 mg via ORAL
  Filled 2022-09-24 (×2): qty 2
  Filled 2022-09-24 (×3): qty 1

## 2022-09-24 MED ORDER — TRANEXAMIC ACID-NACL 1000-0.7 MG/100ML-% IV SOLN
1000.0000 mg | INTRAVENOUS | Status: AC
  Administered 2022-09-24: 1000 mg via INTRAVENOUS
  Filled 2022-09-24: qty 100

## 2022-09-24 MED ORDER — TRANEXAMIC ACID-NACL 1000-0.7 MG/100ML-% IV SOLN: 1000.0000 mg | INTRAVENOUS | Status: DC

## 2022-09-24 MED ORDER — APIXABAN 5 MG PO TABS
5.0000 mg | ORAL_TABLET | Freq: Two times a day (BID) | ORAL | Status: DC
  Administered 2022-09-24 – 2022-09-26 (×4): 5 mg via ORAL
  Filled 2022-09-24 (×4): qty 1

## 2022-09-24 MED ORDER — HYDROMORPHONE HCL 1 MG/ML IJ SOLN
INTRAMUSCULAR | Status: AC
  Filled 2022-09-24: qty 1

## 2022-09-24 MED ORDER — CEFAZOLIN SODIUM-DEXTROSE 2-4 GM/100ML-% IV SOLN: 2.0000 g | INTRAVENOUS | Status: DC

## 2022-09-24 MED ORDER — GUAIFENESIN-DM 100-10 MG/5ML PO SYRP
15.0000 mL | ORAL_SOLUTION | ORAL | Status: DC | PRN
  Administered 2022-09-25: 15 mL via ORAL
  Filled 2022-09-24: qty 20

## 2022-09-24 MED ORDER — MAGNESIUM CITRATE PO SOLN: 1.0000 | Freq: Once | ORAL | Status: DC | PRN

## 2022-09-24 MED ORDER — BUPIVACAINE LIPOSOME 1.3 % IJ SUSP
INTRAMUSCULAR | Status: DC | PRN
  Administered 2022-09-24: 10 mL via PERINEURAL

## 2022-09-24 MED ORDER — ACETAMINOPHEN 500 MG PO TABS: 1000.0000 mg | ORAL_TABLET | Freq: Once | ORAL | Status: DC

## 2022-09-24 MED ORDER — LOSARTAN POTASSIUM 50 MG PO TABS
50.0000 mg | ORAL_TABLET | Freq: Every day | ORAL | Status: DC
  Administered 2022-09-24 – 2022-09-26 (×3): 50 mg via ORAL
  Filled 2022-09-24 (×3): qty 1

## 2022-09-24 MED ORDER — LACTATED RINGERS IV SOLN: INTRAVENOUS | Status: DC

## 2022-09-24 MED ORDER — MIDAZOLAM HCL 2 MG/2ML IJ SOLN
INTRAMUSCULAR | Status: AC
  Administered 2022-09-24: 4 mg via INTRAVENOUS
  Filled 2022-09-24: qty 2

## 2022-09-24 MED ORDER — ATORVASTATIN CALCIUM 80 MG PO TABS
80.0000 mg | ORAL_TABLET | Freq: Every day | ORAL | Status: DC
  Administered 2022-09-24 – 2022-09-26 (×3): 80 mg via ORAL
  Filled 2022-09-24 (×3): qty 1

## 2022-09-24 MED ORDER — GABAPENTIN 100 MG PO CAPS
200.0000 mg | ORAL_CAPSULE | Freq: Three times a day (TID) | ORAL | Status: DC
  Administered 2022-09-24 – 2022-09-26 (×6): 200 mg via ORAL
  Filled 2022-09-24 (×6): qty 2

## 2022-09-24 MED ORDER — FENTANYL 75 MCG/HR TD PT72
1.0000 | MEDICATED_PATCH | TRANSDERMAL | Status: DC
  Administered 2022-09-24: 1 via TRANSDERMAL

## 2022-09-24 SURGICAL SUPPLY — 33 items
BAG COUNTER SPONGE SURGICOUNT (BAG) ×1 IMPLANT
BLADE SAW RECIP 87.9 MT (BLADE) IMPLANT
BLADE SURG 21 STRL SS (BLADE) ×1 IMPLANT
CANISTER WOUND CARE 500ML ATS (WOUND CARE) ×1 IMPLANT
CANISTER WOUNDNEG PRESSURE 500 (CANNISTER) IMPLANT
COVER SURGICAL LIGHT HANDLE (MISCELLANEOUS) ×1 IMPLANT
DRAPE DERMATAC (DRAPES) IMPLANT
DRAPE EXTREMITY T 121X128X90 (DISPOSABLE) ×1 IMPLANT
DRAPE HALF SHEET 40X57 (DRAPES) ×1 IMPLANT
DRAPE INCISE IOBAN 66X45 STRL (DRAPES) ×1 IMPLANT
DRAPE U-SHAPE 47X51 STRL (DRAPES) ×2 IMPLANT
DRESSING PREVENA PLUS CUSTOM (GAUZE/BANDAGES/DRESSINGS) ×1 IMPLANT
DRSG PREVENA PLUS CUSTOM (GAUZE/BANDAGES/DRESSINGS) ×1
DURAPREP 26ML APPLICATOR (WOUND CARE) ×1 IMPLANT
ELECT REM PT RETURN 9FT ADLT (ELECTROSURGICAL) ×1
ELECTRODE REM PT RTRN 9FT ADLT (ELECTROSURGICAL) ×1 IMPLANT
GLOVE BIOGEL PI IND STRL 9 (GLOVE) ×1 IMPLANT
GLOVE SURG ORTHO 9.0 STRL STRW (GLOVE) ×1 IMPLANT
GOWN STRL REUS W/ TWL XL LVL3 (GOWN DISPOSABLE) ×2 IMPLANT
GOWN STRL REUS W/TWL XL LVL3 (GOWN DISPOSABLE) ×2
GRAFT SKIN WND SURGICLOSE M95 (Tissue) IMPLANT
KIT BASIN OR (CUSTOM PROCEDURE TRAY) ×1 IMPLANT
KIT TURNOVER KIT B (KITS) ×1 IMPLANT
MANIFOLD NEPTUNE II (INSTRUMENTS) ×1 IMPLANT
NS IRRIG 1000ML POUR BTL (IV SOLUTION) ×1 IMPLANT
PACK GENERAL/GYN (CUSTOM PROCEDURE TRAY) ×1 IMPLANT
PAD ARMBOARD 7.5X6 YLW CONV (MISCELLANEOUS) ×1 IMPLANT
PREVENA RESTOR ARTHOFORM 46X30 (CANNISTER) ×1 IMPLANT
STAPLER VISISTAT 35W (STAPLE) IMPLANT
SUT ETHILON 2 0 PSLX (SUTURE) ×2 IMPLANT
SUT SILK 2 0 (SUTURE)
SUT SILK 2-0 18XBRD TIE 12 (SUTURE) IMPLANT
TOWEL GREEN STERILE (TOWEL DISPOSABLE) ×1 IMPLANT

## 2022-09-24 NOTE — Anesthesia Postprocedure Evaluation (Signed)
Anesthesia Post Note  Patient: Robert Burnett  Procedure(s) Performed: REVISION LEFT BELOW KNEE AMPUTATION (Left)     Patient location during evaluation: PACU Anesthesia Type: Regional Level of consciousness: awake and alert Pain management: pain level controlled Vital Signs Assessment: post-procedure vital signs reviewed and stable Respiratory status: spontaneous breathing and respiratory function stable Cardiovascular status: stable Postop Assessment: no apparent nausea or vomiting Anesthetic complications: no  No notable events documented.  Last Vitals:  Vitals:   09/24/22 1330 09/24/22 1400  BP: (!) 145/81 129/67  Pulse: 71 64  Resp: 14 18  Temp:    SpO2: 98% 96%    Last Pain:  Vitals:   09/24/22 1400  TempSrc:   PainSc: Asleep                 Shahrzad Koble DANIEL

## 2022-09-24 NOTE — Anesthesia Procedure Notes (Addendum)
Anesthesia Regional Block: Popliteal block   Pre-Anesthetic Checklist: , timeout performed,  Correct Patient, Correct Site, Correct Laterality,  Correct Procedure, Correct Position, site marked,  Risks and benefits discussed,  Surgical consent,  Pre-op evaluation,  At surgeon's request and post-op pain management  Laterality: Left  Prep: chloraprep       Needles:  Injection technique: Single-shot  Needle Type: Echogenic Stimulator Needle          Additional Needles:   Procedures:,,,, ultrasound used (permanent image in chart),,    Narrative:  Start time: 09/24/2022 11:09 AM End time: 09/24/2022 11:19 AM Injection made incrementally with aspirations every 5 mL.  Performed by: Personally  Anesthesiologist: Duane Boston, MD  Additional Notes: A functioning IV was confirmed and monitors were applied.  Sterile prep and drape, hand hygiene and sterile gloves were used.  Negative aspiration and test dose prior to incremental administration of local anesthetic. The patient tolerated the procedure well.Ultrasound  guidance: relevant anatomy identified, needle position confirmed, local anesthetic spread visualized around nerve(s), vascular puncture avoided.  Image printed for medical record. ACB supplementation.

## 2022-09-24 NOTE — H&P (Signed)
Robert Burnett is an 61 y.o. male.   Chief Complaint: Dehiscence left transtibial amputation HPI: The patient is a 61 year old gentleman who is seen status post left below-knee amputation with Kerecis placement on October 4. He has completed a course of doxycycline and is concerned for an area of continued serosanguineous drainage.  He does also take Eliquis.   Past Medical History:  Diagnosis Date   Allergy    Anemia    Anemia    Arthritis    ARTHRITIS IN SPINE - MAKES PT HAVE CHEST PAIN- USES fENTANYL PATCH    Asthma    IN TEENS    Bacteremia    a. 03/2022 Group B Strep bacteremia in setting of diabetic L foot infxn/gangrene/osteo.   CAD (coronary artery disease)    Chronic HFrEF (heart failure with reduced ejection fraction) ([place])    Depression    Diabetes mellitus    Diabetic peripheral neuropathy associated with type 2 diabetes mellitus ([place]) 12/12/2011   Fatty liver disease, nonalcoholic 11/94/1740   Foot osteomyelitis, left ([place])    a. 2012 s/p toe amputations on L; b. 03/2022 - admitted w/ wet gangrene and necrotizing infxn w/ osteomyelitis-->s/p transmetatarsal followed by Lisfranc amputation.   GERD (gastroesophageal reflux disease)    H/O degenerative disc disease    History of blood transfusion    History of echocardiogram    a. 03/2022 Echo: EF 55-60%, no rwma, nl RV fxn, mildly elev PASP. Mild MR.   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[facility])     Past Surgical History:  Procedure Laterality Date   ACHILLES TENDON SURGERY Left 03/28/2022   Procedure: ACHILLES LENGTHENING/KIDNER;  Surgeon: [name], DPM;  Location: [facility];  Service: Podiatry;  Laterality: Left;   AMPUTATION Left 03/25/2022   Procedure: AMPUTATION DIGITS,TRANSMETARSAL AMPUTATION, REMOVAL OF TOES AND BONE BEHIND TOES ;  Surgeon: [name], DPM;  Location: [facility];  Service: Podiatry;  Laterality: Left;   AMPUTATION Left 07/23/2022    Procedure: LEFT BELOW KNEE AMPUTATION;  Surgeon: [name], MD;  Location: [place];  Service: Orthopedics;  Laterality: Left;   APPLICATION OF WOUND VAC Left 03/25/2022   Procedure: APPLICATION OF WOUND VAC;  Surgeon: [name], DPM;  Location: [facility];  Service: Podiatry;  Laterality: Left;   CORONARY STENT INTERVENTION N/A 06/05/2022   Procedure: CORONARY STENT INTERVENTION;  Surgeon: Belva Crome, MD;  Location: [place] [facility];  Service: Cardiovascular;  Laterality: N/A;   CYSTOSCOPY/RETROGRADE/URETEROSCOPY Left 10/29/2020   Procedure: CYSTOSCOPY/RETROGRADE/ LEFT URETEROSCOPY WITH BIOPSY, LEFT URETERAL STENT;  Surgeon: Raynelle Bring, MD;  Location: [facility];  Service: Urology;  Laterality: Left;   IRRIGATION AND DEBRIDEMENT FOOT Left 03/28/2022   Procedure: AMPUTATION LISFRANC, SECONDARY WOUND CLOSURE;  Surgeon: [name], DPM;  Location: [facility];  Service: Podiatry;  Laterality: Left;   KNEE SURGERY Bilateral 1975 and 1989   LEFT HEART CATH AND CORONARY ANGIOGRAPHY N/A 06/05/2022   Procedure: LEFT HEART CATH AND CORONARY ANGIOGRAPHY;  Surgeon: Belva Crome, MD;  Location: [place] CV LAB;  Service: Cardiovascular;  Laterality: N/A;   MULTIPLE TOOTH EXTRACTIONS     ROBOT ASSITED LAPAROSCOPIC NEPHROURETERECTOMY Left 12/13/2020   Procedure: XI ROBOT ASSITED LAPAROSCOPIC NEPHROURETERECTOMY , POST OPERATIVE INTRAVESICAL GEMCITABINE;  Surgeon: Raynelle Bring, MD;  Location: [facility];  Service: Urology;  Laterality: Left;  ONLY NEEDS  [place]  Left    2012, second toe   TRANSURETHRAL RESECTION OF BLADDER TUMOR N/A 08/29/2021   Procedure: TRANSURETHRAL RESECTION OF BLADDER TUMOR (TURBT) WITH CYSTOSCOPY;  Surgeon: Raynelle Bring, MD;  Location: [facility];  Service: Urology;  Laterality: N/A;  GENERAL ANESTHESIA WITH PARALYSIS   WISDOM TOOTH EXTRACTION      Family History  Problem Relation Age of Onset   Cancer Mother    Hypertension Father    Diabetes Father     Lung cancer Father    Diabetes Sister    Cancer Sister    Social History:  reports that he has been smoking pipe. He has quit using smokeless tobacco.  His smokeless tobacco use included chew. He reports that he does not drink alcohol and does not use drugs.  Allergies:  Allergies  Allergen Reactions   Amitriptyline Hcl     Confusion   Baclofen    Cymbalta [Duloxetine Hcl]     confusion   Amlodipine Rash   Prozac [Fluoxetine Hcl] Rash    No medications prior to admission.    No results found for this or any previous visit (from the past 48 hour(s)). No results found.  Review of Systems  All other systems reviewed and are negative.   There were no vitals taken for this visit. Physical Exam  On examination patient has dehiscence of the left transtibial amputation. There is exposed bone there is no purulent drainage there is an ischemic ulcer 1 cm diameter.  Assessment/Plan Assessment: Dehiscence left transtibial amputation.   Plan.  Will plan for revision of the left transtibial amputation due to exposed bone and necrotic ulcer.  Risk and benefits were discussed including infection neurovascular injury recurrent dehiscence need for additional surgery.  Patient states he understands wished to proceed at this time.  [name], MD 09/24/2022, 6:48 AM

## 2022-09-24 NOTE — Transfer of Care (Signed)
Immediate Anesthesia Transfer of Care Note  Patient: YANDEL ZEINER  Procedure(s) Performed: REVISION LEFT BELOW KNEE AMPUTATION (Left)  Patient Location: PACU  Anesthesia Type:General and Regional  Level of Consciousness: drowsy and patient cooperative  Airway & Oxygen Therapy: Patient Spontanous Breathing and Patient connected to nasal cannula oxygen  Post-op Assessment: Report given to RN, Post -op Vital signs reviewed and stable, and Patient moving all extremities X 4  Post vital signs: Reviewed and stable  Last Vitals:  Vitals Value Taken Time  BP 140/89 09/24/22 1254  Temp    Pulse 78 09/24/22 1255  Resp 13 09/24/22 1255  SpO2 99 % 09/24/22 1255  Vitals shown include unvalidated device data.  Last Pain:  Vitals:   09/24/22 0951  TempSrc:   PainSc: 6          Complications: No notable events documented.

## 2022-09-24 NOTE — Op Note (Signed)
09/24/2022  1:07 PM  PATIENT:  [name]    PRE-OPERATIVE DIAGNOSIS:  Left Below Knee Amputation Dehiscence  POST-OPERATIVE DIAGNOSIS:  Same  PROCEDURE:  REVISION LEFT BELOW KNEE AMPUTATION Application of Kerecis micro 95 cm. Application of customizable and arthroform wound VAC.  SURGEON:  [name], MD  PHYSICIAN ASSISTANT:None ANESTHESIA:   General  PREOPERATIVE INDICATIONS:  [name] is a  61 y.o. male with a diagnosis of Left Below Knee Amputation Dehiscence who failed conservative measures and elected for surgical management.    The risks benefits and alternatives were discussed with the patient preoperatively including but not limited to the risks of infection, bleeding, nerve injury, cardiopulmonary complications, the need for revision surgery, among others, and the patient was willing to proceed.  OPERATIVE IMPLANTS: Kerecis micro 95 cm.  '@ENCIMAGES'$ @  OPERATIVE FINDINGS: Patient had ischemic muscle changes.  Margins were resected back to healthy viable bleeding tissue.  Tissue was sent for cultures.  OPERATIVE PROCEDURE: Patient brought the operating room after undergoing a regional anesthesia.  After adequate levels anesthesia obtained patient's left lower extremity was prepped using DuraPrep draped into a sterile field a timeout was called.  A fishmouth incision was made around the wound dehiscence this was carried sharply down to bone.  The distal 2 cm of the tibia and fibula were resected.  The tibia was beveled anteriorly.  The nonviable tissue was resected back to bleeding viable healthy tissue.  The wound was irrigated with normal saline.  There was generalized oozing from all surfaces secondary to the patient's aggressive anticoagulation therapy.  The wound was then filled with the Kerecis micro graft the incision was closed using 2-0 nylon.  The customizable and Arnell Sieving form wound VAC was applied this had a good suction fit and a shrinker was applied  patient was taken the PACU in stable condition.   DISCHARGE PLANNING:  Antibiotic duration: Continue antibiotics until cultures are finalized  Weightbearing: Nonweightbearing on the left  Pain medication: Opioid pathway  Dressing care/ Wound VAC: Wound VAC  Ambulatory devices: Walker   Discharge to: Anticipate discharge to home  Follow-up: In the office 1 week post operative.

## 2022-09-24 NOTE — Anesthesia Procedure Notes (Signed)
Procedure Name: MAC Date/Time: 09/24/2022 12:16 PM  Performed by: Darletta Moll, CRNAPre-anesthesia Checklist: Patient identified, Emergency Drugs available, Suction available and Patient being monitored Patient Re-evaluated:Patient Re-evaluated prior to induction Oxygen Delivery Method: Nasal cannula

## 2022-09-24 NOTE — Interval H&P Note (Signed)
History and Physical Interval Note:  09/24/2022 10:18 AM  Robert Burnett  has presented today for surgery, with the diagnosis of Left Below Knee Amputation Dehiscence.  The various methods of treatment have been discussed with the patient and family. After consideration of risks, benefits and other options for treatment, the patient has consented to  Procedure(s): REVISION LEFT BELOW KNEE AMPUTATION (Left) as a surgical intervention.  The patient's history has been reviewed, patient examined, no change in status, stable for surgery.  I have reviewed the patient's chart and labs.  Questions were answered to the patient's satisfaction.     [name]

## 2022-09-25 DIAGNOSIS — Y835 Amputation of limb(s) as the cause of abnormal reaction of the patient, or of later complication, without mention of misadventure at the time of the procedure: Secondary | ICD-10-CM | POA: Diagnosis present

## 2022-09-25 DIAGNOSIS — E1142 Type 2 diabetes mellitus with diabetic polyneuropathy: Secondary | ICD-10-CM | POA: Diagnosis present

## 2022-09-25 DIAGNOSIS — D638 Anemia in other chronic diseases classified elsewhere: Secondary | ICD-10-CM | POA: Diagnosis present

## 2022-09-25 DIAGNOSIS — Z89512 Acquired absence of left leg below knee: Secondary | ICD-10-CM | POA: Diagnosis not present

## 2022-09-25 DIAGNOSIS — K219 Gastro-esophageal reflux disease without esophagitis: Secondary | ICD-10-CM | POA: Diagnosis present

## 2022-09-25 DIAGNOSIS — Z8589 Personal history of malignant neoplasm of other organs and systems: Secondary | ICD-10-CM | POA: Diagnosis not present

## 2022-09-25 DIAGNOSIS — F17210 Nicotine dependence, cigarettes, uncomplicated: Secondary | ICD-10-CM | POA: Diagnosis present

## 2022-09-25 DIAGNOSIS — I252 Old myocardial infarction: Secondary | ICD-10-CM | POA: Diagnosis not present

## 2022-09-25 DIAGNOSIS — E785 Hyperlipidemia, unspecified: Secondary | ICD-10-CM | POA: Diagnosis present

## 2022-09-25 DIAGNOSIS — F17290 Nicotine dependence, other tobacco product, uncomplicated: Secondary | ICD-10-CM | POA: Diagnosis present

## 2022-09-25 DIAGNOSIS — Z833 Family history of diabetes mellitus: Secondary | ICD-10-CM | POA: Diagnosis not present

## 2022-09-25 DIAGNOSIS — I2510 Atherosclerotic heart disease of native coronary artery without angina pectoris: Secondary | ICD-10-CM | POA: Diagnosis present

## 2022-09-25 DIAGNOSIS — T8781 Dehiscence of amputation stump: Secondary | ICD-10-CM | POA: Diagnosis present

## 2022-09-25 DIAGNOSIS — I5022 Chronic systolic (congestive) heart failure: Secondary | ICD-10-CM | POA: Diagnosis present

## 2022-09-25 DIAGNOSIS — K760 Fatty (change of) liver, not elsewhere classified: Secondary | ICD-10-CM | POA: Diagnosis present

## 2022-09-25 DIAGNOSIS — Z801 Family history of malignant neoplasm of trachea, bronchus and lung: Secondary | ICD-10-CM | POA: Diagnosis not present

## 2022-09-25 DIAGNOSIS — Z7984 Long term (current) use of oral hypoglycemic drugs: Secondary | ICD-10-CM | POA: Diagnosis not present

## 2022-09-25 DIAGNOSIS — Z955 Presence of coronary angioplasty implant and graft: Secondary | ICD-10-CM | POA: Diagnosis not present

## 2022-09-25 DIAGNOSIS — I110 Hypertensive heart disease with heart failure: Secondary | ICD-10-CM | POA: Diagnosis present

## 2022-09-25 DIAGNOSIS — I480 Paroxysmal atrial fibrillation: Secondary | ICD-10-CM | POA: Diagnosis present

## 2022-09-25 DIAGNOSIS — Z794 Long term (current) use of insulin: Secondary | ICD-10-CM | POA: Diagnosis not present

## 2022-09-25 DIAGNOSIS — Z8249 Family history of ischemic heart disease and other diseases of the circulatory system: Secondary | ICD-10-CM | POA: Diagnosis not present

## 2022-09-25 LAB — CBC
HCT: 27.7 % — ABNORMAL LOW (ref 39.0–52.0)
Hemoglobin: 8.8 g/dL — ABNORMAL LOW (ref 13.0–17.0)
MCH: 25.8 pg — ABNORMAL LOW (ref 26.0–34.0)
MCHC: 31.8 g/dL (ref 30.0–36.0)
MCV: 81.2 fL (ref 80.0–100.0)
Platelets: 179 10*3/uL (ref 150–400)
RBC: 3.41 MIL/uL — ABNORMAL LOW (ref 4.22–5.81)
RDW: 15 % (ref 11.5–15.5)
WBC: 8.3 10*3/uL (ref 4.0–10.5)
nRBC: 0 % (ref 0.0–0.2)

## 2022-09-25 LAB — BASIC METABOLIC PANEL
Anion gap: 10 (ref 5–15)
BUN: 15 mg/dL (ref 8–23)
CO2: 25 mmol/L (ref 22–32)
Calcium: 8.2 mg/dL — ABNORMAL LOW (ref 8.9–10.3)
Chloride: 99 mmol/L (ref 98–111)
Creatinine, Ser: 1.07 mg/dL (ref 0.61–1.24)
GFR, Estimated: >60 mL/min (ref >60)
Glucose, Bld: 172 mg/dL — ABNORMAL HIGH (ref 70–99)
Potassium: 4.3 mmol/L (ref 3.5–5.1)
Sodium: 134 mmol/L — ABNORMAL LOW (ref 135–145)

## 2022-09-25 LAB — GLUCOSE, CAPILLARY
Glucose-Capillary: 111 mg/dL — ABNORMAL HIGH (ref 70–99)
Glucose-Capillary: 210 mg/dL — ABNORMAL HIGH (ref 70–99)
Glucose-Capillary: 140 mg/dL — ABNORMAL HIGH (ref 70–99)
Glucose-Capillary: 168 mg/dL — ABNORMAL HIGH (ref 70–99)

## 2022-09-25 NOTE — Progress Notes (Signed)
Patient ID: Robert Burnett, male   DOB: [birth year], 61 y.o.   MRN: [id number] Patient is postoperative day 1 revision left below-knee amputation.  Patient is on multiple blood thinners and appears the patient developed muscle ischemia secondary to hematoma.  Deep soft tissue was sent for cultures.  Cultures negative to date.  There is no drainage in the wound VAC canister there is not a good suction fit this may need to be reinforced prior to discharge.

## 2022-09-25 NOTE — Evaluation (Signed)
Physical Therapy Evaluation Patient Details Name: Robert Burnett MRN: [id number] DOB: [birth year] Today's Date: 09/25/2022  History of Present Illness  61 yr old male who presented 07/25/22 . PMH: HTN, T2DM, fatty liver, HLD, hx of urothelial cancer, diabetic neuropathy, 6/23 diabetic foot infection and necrotizing infection and OA with transmetatarsal amputaion, NSTEMI  Clinical Impression  Pt is demonstrating good mobility for transfers from EOB to W/C and then to recliner. Pt did not perform W/C mobility today due to order restricting only chair transfers POD 1. Pt has family living with him and has intermittent assistance if needed as well as an aide that comes 5x/week for help with ADL"s. Pt has a ramp for entering/exiting home and has no needs once discharged from acute care hospital setting for skilled physical therapy services.   Pt did require verbal education on the importance of knee extension if he plans to use a prosthetic in the future. Pt states understanding. No signs/symptoms of cardiac/respiratory distress during session.      Recommendations for follow up therapy are one component of a multi-disciplinary discharge planning process, led by the attending physician.  Recommendations may be updated based on patient status, additional functional criteria and insurance authorization.  Follow Up Recommendations No PT follow up      Assistance Recommended at Discharge Intermittent Supervision/Assistance  Patient can return home with the following  A little help with bathing/dressing/bathroom    Equipment Recommendations Other (comment) (pt has all needed equipment at home.)  Recommendations for Other Services       Functional Status Assessment Patient has had a recent decline in their functional status and demonstrates the ability to make significant improvements in function in a reasonable and predictable amount of time.     Precautions / Restrictions Precautions Precautions:  None Required Braces or Orthoses: Other Brace (compression sock on the L over L LE residual limb) Restrictions Weight Bearing Restrictions: Yes LLE Weight Bearing: Weight bearing as tolerated      Mobility  Bed Mobility Overal bed mobility: Modified Independent             General bed mobility comments: pt uses bed rails for mobility Patient Response: Cooperative  Transfers Overall transfer level: Needs assistance Equipment used: None Transfers: Bed to chair/wheelchair/BSC       Squat pivot transfers: Supervision     General transfer comment: pt requires some assistance with set-up      Architect Wheelchair mobility:  (Deferred due to only transfers POD 1)  Modified Rankin (Stroke Patients Only)       Balance Overall balance assessment: Mild deficits observed, not formally tested           Pertinent Vitals/Pain Pain Assessment Pain Assessment: 0-10 Pain Score: 8  Pain Descriptors / Indicators: Sharp Pain Intervention(s): Limited activity within patient's tolerance, Monitored during session, Repositioned    Home Living Family/patient expects to be discharged to:: Private residence Living Arrangements: Other relatives;Alone (nephew lives in back of house) Available Help at Discharge: Available PRN/intermittently;Family (nephew works during the day) Type of Home: House Home Access: [place]: One [place]: Conservation officer, nature (2 wheels);Wheelchair - manual;BSC/3in1;Crutches;Tub bench Additional Comments: states he has had no trouble using shower seat and took a shower yesterday before coming to the hospital.    Prior Function Prior Level of Function : Independent/Modified Independent             Mobility Comments: Pt  states that he has been using the W/C mainly but occasionally uses the walker. ADLs Comments: has a caretaker that comes in 5x/week to help with ADL's.     Hand  Dominance   Dominant Hand: Right    Extremity/Trunk Assessment   Upper Extremity Assessment Upper Extremity Assessment: Overall WFL for tasks assessed    Lower Extremity Assessment Lower Extremity Assessment: Overall WFL for tasks assessed;LLE deficits/detail LLE Deficits / Details: Pt has BKA demonstrates overall good strength in the L hip with mobility though not assessed formally due to recent debridement.    Cervical / Trunk Assessment Cervical / Trunk Assessment: Normal  Communication   Communication: No difficulties  Cognition Arousal/Alertness: Awake/alert Behavior During Therapy: WFL for tasks assessed/performed Overall Cognitive Status: Within Functional Limits for tasks assessed           General Comments: has some difficulty with word finding and needs extra time.        General Comments General comments (skin integrity, edema, etc.): Pt demonstrates overall good sitting balance with intermittent need for UE assistance        Assessment/Plan    PT Assessment Patient needs continued PT services  PT Problem List Decreased activity tolerance       PT Treatment Interventions Therapeutic activities;Gait training;Therapeutic exercise;Stair training;Manual techniques;Patient/family education;Balance training;Functional mobility training    PT Goals (Current goals can be found in the Care Plan section)  Acute Rehab PT Goals Patient Stated Goal: Return home with [facility] aide. PT Goal Formulation: With patient Time For Goal Achievement: 10/02/22 Potential to Achieve Goals: Good Additional Goals Additional Goal #1: Pt will navigate W/C for homa and community distances in order to demonstrate autonomy of care.    Frequency Min 2X/week        AM-PAC PT "6 Clicks" Mobility  Outcome Measure Help needed turning from your back to your side while in a flat bed without using bedrails?: A Little Help needed moving from lying on your back to sitting on the side of a  flat bed without using bedrails?: A Little Help needed moving to and from a bed to a chair (including a wheelchair)?: A Little Help needed standing up from a chair using your arms (e.g., wheelchair or bedside chair)?: A Little Help needed to walk in hospital room?: A Lot Help needed climbing 3-5 steps with a railing? : Total 6 Click Score: 15    End of Session Equipment Utilized During Treatment: Other (comment) (compression sock and padding on L residual limb) Activity Tolerance: Patient tolerated treatment well Patient left: in chair;with chair alarm set;with call bell/phone within reach Nurse Communication: Mobility status PT Visit Diagnosis: Other abnormalities of gait and mobility (R26.89)    Time: 0973-5329 PT Time Calculation (min) (ACUTE ONLY): 32 min   Charges:   PT Evaluation $PT Eval Low Complexity: 1 Low PT Treatments $Therapeutic Activity: 8-22 mins       Tomma Rakers, DPT, [place] Office: [phone] (Secure chat preferred)   Ander Purpura 09/25/2022, 9:49 AM

## 2022-09-26 ENCOUNTER — Ambulatory Visit: Payer: Commercial Managed Care - HMO | Admitting: Cardiology

## 2022-09-26 ENCOUNTER — Encounter (HOSPITAL_COMMUNITY): Payer: Self-pay | Admitting: Orthopedic Surgery

## 2022-09-26 LAB — GLUCOSE, CAPILLARY
Glucose-Capillary: 108 mg/dL — ABNORMAL HIGH (ref 70–99)
Glucose-Capillary: 211 mg/dL — ABNORMAL HIGH (ref 70–99)

## 2022-09-26 LAB — BASIC METABOLIC PANEL
Anion gap: 9 (ref 5–15)
BUN: 21 mg/dL (ref 8–23)
CO2: 26 mmol/L (ref 22–32)
Calcium: 8.3 mg/dL — ABNORMAL LOW (ref 8.9–10.3)
Chloride: 99 mmol/L (ref 98–111)
Creatinine, Ser: 1.08 mg/dL (ref 0.61–1.24)
GFR, Estimated: >60 mL/min (ref >60)
Glucose, Bld: 119 mg/dL — ABNORMAL HIGH (ref 70–99)
Potassium: 3.6 mmol/L (ref 3.5–5.1)
Sodium: 134 mmol/L — ABNORMAL LOW (ref 135–145)

## 2022-09-26 LAB — CBC
HCT: 23.6 % — ABNORMAL LOW (ref 39.0–52.0)
Hemoglobin: 7.8 g/dL — ABNORMAL LOW (ref 13.0–17.0)
MCH: 26.5 pg (ref 26.0–34.0)
MCHC: 33.1 g/dL (ref 30.0–36.0)
MCV: 80.3 fL (ref 80.0–100.0)
Platelets: 121 10*3/uL — ABNORMAL LOW (ref 150–400)
RBC: 2.94 MIL/uL — ABNORMAL LOW (ref 4.22–5.81)
RDW: 14.8 % (ref 11.5–15.5)
WBC: 5.3 10*3/uL (ref 4.0–10.5)
nRBC: 0 % (ref 0.0–0.2)

## 2022-09-26 NOTE — Progress Notes (Signed)
AVS given to patient, wound vac switched to [place], requesting to eat first before leaving, lunch delivered.

## 2022-09-26 NOTE — Progress Notes (Signed)
Patient ID: Robert Burnett, male   DOB: 1961-04-28, 61 y.o.   MRN: [id number] Patient's status post revision transtibial amputation.  Cultures are negative.  Patient will continue with his Keflex at home and we will adjust this if cultures are positive.  Patient will discharge with the Praveena plus portable wound VAC pump.  Patient has medication at home through with his pain clinic.

## 2022-09-26 NOTE — Plan of Care (Signed)
  Problem: Coping: Goal: Ability to adjust to condition or change in health will improve Outcome: Progressing   Problem: Health Behavior/Discharge Planning: Goal: Ability to manage health-related needs will improve Outcome: Progressing   Problem: Nutritional: Goal: Maintenance of adequate nutrition will improve Outcome: Progressing

## 2022-09-26 NOTE — Progress Notes (Signed)
It was brought to my attention that Robert Burnett didn't work well. Pt seen in room, reinforced with Tegaderm. And appears workin wilthout issue.Pt has no complaints. And primary nurse made aware, and per primary nurse pt is ready to be discharged.

## 2022-09-26 NOTE — Plan of Care (Signed)
  Problem: Education: Goal: Ability to describe self-care measures that may prevent or decrease complications (Diabetes Survival Skills Education) will improve Outcome: Adequate for Discharge Goal: Individualized Educational Video(s) Outcome: Adequate for Discharge   Problem: Coping: Goal: Ability to adjust to condition or change in health will improve Outcome: Adequate for Discharge   Problem: Fluid Volume: Goal: Ability to maintain a balanced intake and output will improve Outcome: Adequate for Discharge   Problem: Health Behavior/Discharge Planning: Goal: Ability to identify and utilize available resources and services will improve Outcome: Adequate for Discharge Goal: Ability to manage health-related needs will improve Outcome: Adequate for Discharge   Problem: Metabolic: Goal: Ability to maintain appropriate glucose levels will improve Outcome: Adequate for Discharge   Problem: Nutritional: Goal: Maintenance of adequate nutrition will improve Outcome: Adequate for Discharge Goal: Progress toward achieving an optimal weight will improve Outcome: Adequate for Discharge   Problem: Skin Integrity: Goal: Risk for impaired skin integrity will decrease Outcome: Adequate for Discharge   Problem: Tissue Perfusion: Goal: Adequacy of tissue perfusion will improve Outcome: Adequate for Discharge   Problem: Education: Goal: Knowledge of the prescribed therapeutic regimen will improve Outcome: Adequate for Discharge Goal: Ability to verbalize activity precautions or restrictions will improve Outcome: Adequate for Discharge Goal: Understanding of discharge needs will improve Outcome: Adequate for Discharge   Problem: Activity: Goal: Ability to perform//tolerate increased activity and mobilize with assistive devices will improve Outcome: Adequate for Discharge   Problem: Clinical Measurements: Goal: Postoperative complications will be avoided or minimized Outcome: Adequate  for Discharge   Problem: Self-Care: Goal: Ability to meet self-care needs will improve Outcome: Adequate for Discharge   Problem: Self-Concept: Goal: Ability to maintain and perform role responsibilities to the fullest extent possible will improve Outcome: Adequate for Discharge   Problem: Pain Management: Goal: Pain level will decrease with appropriate interventions Outcome: Adequate for Discharge   Problem: Skin Integrity: Goal: Demonstration of wound healing without infection will improve Outcome: Adequate for Discharge   Problem: Education: Goal: Knowledge of General Education information will improve Description: Including pain rating scale, medication(s)/side effects and non-pharmacologic comfort measures Outcome: Adequate for Discharge   Problem: Health Behavior/Discharge Planning: Goal: Ability to manage health-related needs will improve Outcome: Adequate for Discharge   Problem: Clinical Measurements: Goal: Ability to maintain clinical measurements within normal limits will improve Outcome: Adequate for Discharge Goal: Will remain free from infection Outcome: Adequate for Discharge Goal: Diagnostic test results will improve Outcome: Adequate for Discharge Goal: Respiratory complications will improve Outcome: Adequate for Discharge Goal: Cardiovascular complication will be avoided Outcome: Adequate for Discharge   Problem: Activity: Goal: Risk for activity intolerance will decrease Outcome: Adequate for Discharge   Problem: Nutrition: Goal: Adequate nutrition will be maintained Outcome: Adequate for Discharge   Problem: Coping: Goal: Level of anxiety will decrease Outcome: Adequate for Discharge   Problem: Elimination: Goal: Will not experience complications related to bowel motility Outcome: Adequate for Discharge Goal: Will not experience complications related to urinary retention Outcome: Adequate for Discharge   Problem: Pain Managment: Goal:  General experience of comfort will improve Outcome: Adequate for Discharge   Problem: Safety: Goal: Ability to remain free from injury will improve Outcome: Adequate for Discharge   Problem: Skin Integrity: Goal: Risk for impaired skin integrity will decrease Outcome: Adequate for Discharge

## 2022-09-26 NOTE — Discharge Summary (Signed)
Discharge Diagnoses:  Principal Problem:   S/P BKA (below knee amputation) unilateral, left (HCC) Active Problems:   Hx of BKA, left ([place])   Dehiscence of amputation stump ([place])   Surgeries: Procedure(s): REVISION LEFT BELOW KNEE AMPUTATION on 09/24/2022    Consultants:   Discharged Condition: Improved  Hospital Course: DAMONTAE LOPPNOW is an 61 y.o. male who was admitted 09/24/2022 with a chief complaint of dehiscence left below-knee amputation, with a final diagnosis of Left Below Knee Amputation Dehiscence.  Patient was brought to the operating room on 09/24/2022 and underwent Procedure(s): REVISION LEFT BELOW KNEE AMPUTATION.    Patient was given perioperative antibiotics:  Anti-infectives (From admission, onward)    Start     Dose/Rate Route Frequency Ordered Stop   09/24/22 2000  ceFAZolin (ANCEF) IVPB 2g/100 mL premix        2 g 200 mL/hr over 30 Minutes Intravenous Every 8 hours 09/24/22 1547 09/26/22 1959   09/24/22 0915  ceFAZolin (ANCEF) IVPB 2g/100 mL premix  Status:  Discontinued        2 g 200 mL/hr over 30 Minutes Intravenous On call to O.R. 09/24/22 0913 09/24/22 0913   09/24/22 0915  ceFAZolin (ANCEF) IVPB 2g/100 mL premix        2 g 200 mL/hr over 30 Minutes Intravenous On call to O.R. 09/24/22 4709 09/24/22 1208     .  Patient was given sequential compression devices, early ambulation, and aspirin for DVT prophylaxis.  Recent vital signs: Patient Vitals for the past 24 hrs:  BP Temp Temp src Pulse Resp SpO2  09/25/22 2006 134/86 98.2 F (36.8 C) Oral 72 16 100 %  09/25/22 1544 118/63 98.5 F (36.9 C) Oral 71 18 100 %  09/25/22 0911 134/76 98.6 F (37 C) -- 80 17 100 %  09/25/22 0900 134/76 98.6 F (37 C) Oral 80 19 100 %  .  Recent laboratory studies: No results found.  Discharge Medications:   Allergies as of 09/26/2022       Reactions   Amitriptyline Hcl    Confusion   Baclofen    Cymbalta [duloxetine Hcl]    confusion   Amlodipine Rash    Prozac [fluoxetine Hcl] Rash        Medication List     TAKE these medications    acetaminophen 325 MG tablet Commonly known as: TYLENOL Take 1-2 tablets (325-650 mg total) by mouth every 4 (four) hours as needed for mild pain.   apixaban 5 MG Tabs tablet Commonly known as: ELIQUIS Take 1 tablet (5 mg total) by mouth 2 (two) times daily.   atorvastatin 80 MG tablet Commonly known as: LIPITOR Take 1 tablet (80 mg total) by mouth daily.   bisacodyl 5 MG EC tablet Commonly known as: DULCOLAX Take 1 tablet (5 mg total) by mouth daily as needed for moderate constipation.   cephALEXin 500 MG capsule Commonly known as: KEFLEX Take 500 mg by mouth 4 (four) times daily.   clopidogrel 75 MG tablet Commonly known as: PLAVIX Take 1 tablet (75 mg total) by mouth daily.   diclofenac Sodium 1 % Gel Commonly known as: VOLTAREN Apply 2 g topically 4 (four) times daily as needed (Left hip pain).   docusate sodium 100 MG capsule Commonly known as: COLACE Take 1 capsule (100 mg total) by mouth daily. What changed:  when to take this reasons to take this   doxycycline 100 MG tablet Commonly known as: VIBRA-TABS Take 1 tablet (100 mg  total) by mouth 2 (two) times daily.   ezetimibe 10 MG tablet Commonly known as: ZETIA Take 1 tablet (10 mg total) by mouth daily.   Farxiga 10 MG Tabs tablet Generic drug: dapagliflozin propanediol Take 1 tablet (10 mg total) by mouth daily before breakfast.   fentaNYL 75 MCG/HR Commonly known as: [place] 1 patch onto the skin every 3 (three) days.   finasteride 5 MG tablet Commonly known as: PROSCAR Take 1 tablet (5 mg total) by mouth daily.   furosemide 40 MG tablet Commonly known as: Lasix Take 1 tablet (40 mg total) by mouth daily as needed for edema or fluid (Take 1 tablet if your weight increases more than 2 pounds in 24 hours.).   gabapentin 100 MG capsule Commonly known as: NEURONTIN Take 2 capsules (200 mg total) by  mouth 3 (three) times daily.   insulin NPH-regular Human (70-30) 100 UNIT/ML injection Inject 10 Units into the skin 2 (two) times daily with a meal. RELION BRAND PLEASE What changed:  when to take this additional instructions   lidocaine 5 % Commonly known as: LIDODERM Place 2 patches onto the skin daily. Remove & Discard patch within 12 hours or as directed by MD   losartan 50 MG tablet Commonly known as: COZAAR Take 1 tablet (50 mg total) by mouth daily.   magnesium oxide 400 MG tablet Commonly known as: MAG-OX Take 0.5 tablets (200 mg total) by mouth at bedtime.   methocarbamol 750 MG tablet Commonly known as: ROBAXIN Take 1 tablet (750 mg total) by mouth every 8 (eight) hours as needed for muscle spasms.   metoprolol succinate 25 MG 24 hr tablet Commonly known as: TOPROL-XL Take 1 tablet (25 mg total) by mouth 3 (three) times daily.   nicotine 7 mg/24hr patch Commonly known as: NICODERM CQ - dosed in mg/24 hr 7 mg patch daily x2 weeks and stop   OneTouch Delica Plus [id number] Misc Apply topically.   OneTouch Ultra test strip Generic drug: glucose blood   oxyCODONE 5 MG immediate release tablet Commonly known as: Oxy IR/ROXICODONE Take 1 tablet (5 mg total) by mouth 3 (three) times daily as needed for moderate pain. Do not Fill Before 11/ 23/2023. No early refills   pantoprazole 40 MG tablet Commonly known as: PROTONIX Take 1 tablet (40 mg total) by mouth daily.   polyethylene glycol 17 g packet Commonly known as: MIRALAX / GLYCOLAX Take 17 g by mouth daily. What changed:  when to take this reasons to take this   vitamin C 1000 MG tablet Take 1 tablet (1,000 mg total) by mouth daily.   Vitamin D (Ergocalciferol) 1.25 MG (50000 UNIT) Caps capsule Commonly known as: DRISDOL Take 1 capsule (50,000 Units total) by mouth every 7 (seven) days.        Diagnostic Studies: ECHOCARDIOGRAM LIMITED  Result Date: 08/27/2022    ECHOCARDIOGRAM LIMITED REPORT    Patient Name:   ANDI MAHAFFY Date of Exam: 08/27/2022 Medical Rec #:  [id number]        Height:       73.0 in Accession #:    [id number]       Weight:       178.0 lb Date of Birth:  [birth year]         BSA:          2.048 m Patient Age:    32 years         BP:  157/81 mmHg Patient Gender: M                HR:           58 bpm. Exam Location:  [facility] Procedure: 2D Echo, 3D Echo, Cardiac Doppler and Color Doppler Indications:    I50.22 CHF  History:        Patient has prior history of Echocardiogram examinations, most                 recent 06/05/2022. CAD, Arrythmias:Atrial Fibrillation; Risk                 Factors:Hypertension and HLD.  Sonographer:    Marygrace Drought RCS Referring Phys: [id number] [place]  1. Limited study; all images not obtained; LV function has normalized compared to 06/05/22.  2. Left ventricular ejection fraction, by estimation, is 55 to 60%. The left ventricle has normal function. The left ventricle has no regional wall motion abnormalities. Left ventricular diastolic parameters were normal.  3. Right ventricular systolic function is normal. The right ventricular size is normal. Tricuspid regurgitation signal is inadequate for assessing PA pressure.  4. The mitral valve is normal in structure. Mild mitral valve regurgitation.  5. The aortic valve is tricuspid. Aortic valve regurgitation is not visualized. No aortic stenosis is present.  6. The inferior vena cava is normal in size with greater than 50% respiratory variability, suggesting right atrial pressure of 3 mmHg. FINDINGS  Left Ventricle: Left ventricular ejection fraction, by estimation, is 55 to 60%. The left ventricle has normal function. The left ventricle has no regional wall motion abnormalities. The left ventricular internal cavity size was normal in size. There is  no left ventricular hypertrophy. Left ventricular diastolic parameters were normal. Right Ventricle: The right ventricular size is normal.  Right ventricular systolic function is normal. Tricuspid regurgitation signal is inadequate for assessing PA pressure. The tricuspid regurgitant velocity is 1.90 m/s, and with an assumed right atrial  pressure of 3 mmHg, the estimated right ventricular systolic pressure is 12.8 mmHg. Left Atrium: Left atrial size was normal in size. Right Atrium: Right atrial size was normal in size. Pericardium: There is no evidence of pericardial effusion. Mitral Valve: The mitral valve is normal in structure. Mild mitral valve regurgitation. Tricuspid Valve: The tricuspid valve is normal in structure. Tricuspid valve regurgitation is trivial. Aortic Valve: The aortic valve is tricuspid. Aortic valve regurgitation is not visualized. No aortic stenosis is present. Pulmonic Valve: The pulmonic valve was normal in structure. Aorta: The aortic root is normal in size and structure. Venous: The inferior vena cava is normal in size with greater than 50% respiratory variability, suggesting right atrial pressure of 3 mmHg. Additional Comments: Limited study; all images not obtained; LV function has normalized compared to 06/05/22.  LEFT VENTRICLE PLAX 2D LVIDd:         5.10 cm   Diastology LVIDs:         3.50 cm   LV e' medial:    10.00 cm/s LV PW:         0.90 cm   LV E/e' medial:  9.4 LV IVS:        0.90 cm   LV e' lateral:   12.80 cm/s LVOT diam:     2.00 cm   LV E/e' lateral: 7.4 LVOT Area:     3.14 cm  3D Volume EF:                          3D EF:        54 %                          LV EDV:       211 ml                          LV ESV:       97 ml                          LV SV:        114 ml RIGHT VENTRICLE RVSP:           17.4 mmHg LEFT ATRIUM         Index       RIGHT ATRIUM LA diam:    4.60 cm 2.25 cm/m  RA Pressure: 3.00 mmHg   AORTA Ao Root diam: 3.30 cm Ao Asc diam:  3.60 cm MITRAL VALVE               TRICUSPID VALVE MV Area (PHT):             TR Peak grad:   14.4 mmHg MV Decel Time:             TR  Vmax:        190.00 cm/s MV E velocity: 94.30 cm/s  Estimated RAP:  3.00 mmHg MV A velocity: 43.50 cm/s  RVSP:           17.4 mmHg MV E/A ratio:  2.17                            SHUNTS                            Systemic Diam: 2.00 cm Kirk Ruths MD Electronically signed by Kirk Ruths MD Signature Date/Time: 08/27/2022/1:02:20 PM    Final     Patient benefited maximally from their hospital stay and there were no complications.     Disposition: Discharge disposition: 01-Home or Self Care      Discharge Instructions     Call MD / Call 911   Complete by: As directed    If you experience chest pain or shortness of breath, CALL 911 and be transported to the hospital emergency room.  If you develope a fever above 101 F, pus (white drainage) or increased drainage or redness at the wound, or calf pain, call your surgeon's office.   Constipation Prevention   Complete by: As directed    Drink plenty of fluids.  Prune juice may be helpful.  You may use a stool softener, such as Colace (over the counter) 100 mg twice a day.  Use MiraLax (over the counter) for constipation as needed.   Diet - low sodium heart healthy   Complete by: As directed    Increase activity slowly as tolerated   Complete by: As directed    Negative Pressure Wound Therapy - Incisional   Complete by: As directed    Post-operative opioid taper instructions:   Complete by: As directed    POST-OPERATIVE OPIOID TAPER INSTRUCTIONS: It is important  to wean off of your opioid medication as soon as possible. If you do not need pain medication after your surgery it is ok to stop day one. Opioids include: Codeine, Hydrocodone(Norco, Vicodin), Oxycodone(Percocet, oxycontin) and hydromorphone amongst others.  Long term and even short term use of opiods can cause: Increased pain response Dependence Constipation Depression Respiratory depression And more.  Withdrawal symptoms can include Flu like symptoms Nausea,  vomiting And more Techniques to manage these symptoms Hydrate well Eat regular healthy meals Stay active Use relaxation techniques(deep breathing, meditating, yoga) Do Not substitute Alcohol to help with tapering If you have been on opioids for less than two weeks and do not have pain than it is ok to stop all together.  Plan to wean off of opioids This plan should start within one week post op of your joint replacement. Maintain the same interval or time between taking each dose and first decrease the dose.  Cut the total daily intake of opioids by one tablet each day Next start to increase the time between doses. The last dose that should be eliminated is the evening dose.          Follow-up Information     [name], MD Follow up in 1 week(s).   Specialty: Orthopedic Surgery Contact information: [address] [place] [address] [phone]                  Signed: [name] 09/26/2022, 8:40 AM

## 2022-09-26 NOTE — Progress Notes (Signed)
Pt. D/C order in, wants to eat first before leaving.

## 2022-09-28 ENCOUNTER — Other Ambulatory Visit: Payer: Self-pay | Admitting: Orthopedic Surgery

## 2022-09-28 LAB — BPAM RBC
Blood Product Expiration Date: 202312202359
Blood Product Expiration Date: 202312202359
Unit Type and Rh: 6200
Unit Type and Rh: 6200

## 2022-09-28 LAB — TYPE AND SCREEN
ABO/RH(D): AB POS
Antibody Screen: POSITIVE
Unit division: 0
Unit division: 0

## 2022-09-28 MED ORDER — CIPROFLOXACIN HCL 500 MG PO TABS: 500.0000 mg | ORAL_TABLET | Freq: Two times a day (BID) | ORAL | 0 refills | Status: DC

## 2022-09-29 ENCOUNTER — Telehealth: Payer: Self-pay

## 2022-09-29 ENCOUNTER — Other Ambulatory Visit: Payer: Self-pay | Admitting: Orthopedic Surgery

## 2022-09-29 LAB — AEROBIC/ANAEROBIC CULTURE W GRAM STAIN (SURGICAL/DEEP WOUND): Gram Stain: NONE SEEN

## 2022-09-29 MED ORDER — AMOXICILLIN-POT CLAVULANATE 875-125 MG PO TABS: 1.0000 | ORAL_TABLET | Freq: Two times a day (BID) | ORAL | 0 refills | Status: DC

## 2022-09-29 NOTE — Telephone Encounter (Signed)
-----   Message from [name], MD sent at 09/28/2022  7:49 PM EST ----- Call patient, continue keflex, and new rx sent for cipro, he has 2 bacteria ----- Message ----- From: Interface, Lab In [facility] Sent: 09/26/2022   1:56 PM EST To: [name], MD

## 2022-09-29 NOTE — Telephone Encounter (Signed)
Transition Care Management Unsuccessful Follow-up Telephone Call  Date of discharge and from where:  09/26/2022, [facility]  Attempts:  1st Attempt  Reason for unsuccessful TCM follow-up call:  Voice mail full [phone].  Need to discuss scheduling a hospital follow up appointment with PCP.

## 2022-09-29 NOTE — Telephone Encounter (Signed)
Called pt to advise Cipro sent to pharm. He advised that he is almost out of Keflex he is taking 2 tabs BID and will need a refill on this please sent to the [facility] and we will pick up both abx this evening.   Appt also sch for f/u Friday at 10:45

## 2022-09-29 NOTE — Telephone Encounter (Signed)
Pt informed

## 2022-09-30 ENCOUNTER — Telehealth: Payer: Self-pay

## 2022-09-30 NOTE — Telephone Encounter (Signed)
From the discharge call:   He stated he is doing all right.no questions/concerns at this time  He has all of his medications as well as a glucometer and he did not have any questions about the med regime   uses wheelchair for mobility  mostly independent with personal care, meal preparation, transfers, using the bathroom. He does have help from a friend when needed. Wound VAC in place and functioning well.    Scheduled to see Dr Margarita Rana- 10/29/2022, orthopedics- 10/03/2022, PMR- 10/06/2022.

## 2022-09-30 NOTE — Telephone Encounter (Signed)
Transition Care Management Follow-up Telephone Call Date of discharge and from where: 09/26/2022, [facility]  How have you been since you were released from the hospital? He stated he is doing all right  Any questions or concerns? No  Items Reviewed: Did the pt receive and understand the discharge instructions provided? Yes  Medications obtained and verified? Yes - he stated that he has all of his medications as well as a glucometer and he did not have any questions about the med regime  Other? No  Any new allergies since your discharge? No  Dietary orders reviewed? Yes- he said he is trying to adhere to the prescribed diet.  Do you have support at home? Yes   Home Care and Equipment/Supplies: Were home health services ordered? no If so, what is the name of the agency? N/a  Has the agency set up a time to come to the patient's home? not applicable Were any new equipment or medical supplies ordered?  Yes: wound VAC What is the name of the medical supply agency? He was given this in the hospital  Were you able to get the supplies/equipment? yes Do you have any questions related to the use of the equipment or supplies? No  Functional Questionnaire: (I = Independent and D = Dependent) ADLs: uses wheelchair for mobility  mostly independent with personal care, meal preparation, transfers, using the bathroom. He does have help from a friend when needed.    Follow up appointments reviewed:  PCP Hospital f/u appt confirmed? Yes  Scheduled to see Dr Margarita Rana- 10/29/2022  [facility] f/u appt confirmed? Yes  Scheduled to see orthopedics- 10/03/2022, PMR- 10/06/2022.  Are transportation arrangements needed? No  If their condition worsens, is the pt aware to call PCP or go to the Emergency Dept.? Yes Was the patient provided with contact information for the PCP's office or ED? Yes Was to pt encouraged to call back with questions or concerns? Yes

## 2022-10-03 ENCOUNTER — Ambulatory Visit (INDEPENDENT_AMBULATORY_CARE_PROVIDER_SITE_OTHER): Payer: Commercial Managed Care - HMO | Admitting: Family

## 2022-10-03 ENCOUNTER — Encounter: Payer: Self-pay | Admitting: Family

## 2022-10-03 DIAGNOSIS — Z89512 Acquired absence of left leg below knee: Secondary | ICD-10-CM

## 2022-10-03 DIAGNOSIS — S88112A Complete traumatic amputation at level between knee and ankle, left lower leg, initial encounter: Secondary | ICD-10-CM

## 2022-10-03 NOTE — Progress Notes (Signed)
Post-Op Visit Note   Patient: Robert Burnett           Date of Birth: 08-31-1961           MRN: [id number] Visit Date: 10/03/2022 PCP: Charlott Rakes, MD  Chief Complaint:  Chief Complaint  Patient presents with   Left Leg - Routine Post Op    10/14/2022 revision left BKA 95 micro     HPI:  HPI The patient is a 61 year old gentleman seen status post revision left below-knee amputation on December 6.    Reports that his wound VAC did stop working about 4 days ago.  Is currently on Plavix and Eliquis also amoxicillin and Cipro  Ortho Exam On examination of the left residual limb incision approximated with sutures there is no gaping no active drainage there is some edema without erythema or warmth.  Visit Diagnoses: No diagnosis found.  Plan: Begin daily Dial soap cleansing dry dressings elevate for swelling continue shrinker follow-up in 2 weeks  Follow-Up Instructions: No follow-ups on file.   Imaging: No results found.  Orders:  No orders of the defined types were placed in this encounter.  No orders of the defined types were placed in this encounter.    PMFS History: Patient Active Problem List   Diagnosis Date Noted   Dehiscence of amputation stump ([place]) 09/24/2022   S/P BKA (below knee amputation) unilateral, left ([place]) 09/24/2022   Hx of BKA, left ([place])    Osteomyelitis of left lower extremity ([place]) 07/25/2022   Cutaneous abscess of left ankle 07/23/2022   Cellulitis of left foot    Foot osteomyelitis, left ([place]) 07/22/2022   CAD S/P percutaneous coronary angioplasty 07/22/2022   Ischemic cardiomyopathy 07/22/2022   CKD (chronic kidney disease) stage 2, GFR 60-89 ml/min 07/22/2022   Normocytic anemia 07/22/2022   Tobacco abuse 07/22/2022   PAF (paroxysmal atrial fibrillation) ([place]) 07/22/2022   Hyponatremia 07/22/2022   NSTEMI (non-ST elevated myocardial infarction) ([place]) 06/04/2022   Type 2 diabetes mellitus with complication, with long-term  current use of insulin ([place]) 06/04/2022   Hypomagnesemia 06/04/2022   Hyperlipidemia 06/04/2022   Diabetic ulcer of left foot ([place]) 06/04/2022   History of transmetatarsal amputation of foot ([place]) 04/30/2022   Gangrene of left foot ([place]) 03/25/2022   Hypokalemia 03/25/2022   Severe protein-calorie malnutrition ([place]) 03/25/2022   Pyogenic inflammation of bone (HCC)    Necrotizing soft tissue infection    Abscess of tendon sheath, left ankle and foot    Lateral epicondylitis of left elbow 08/01/2021   Ureteral cancer, 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   Proteinuria 09/19/2018   Hematuria 09/19/2018   Anterior cervical adenopathy 09/19/2018   Fatty liver disease, nonalcoholic 40/98/1191   Hypertension 06/27/2016   Thoracic spondylosis with myelopathy 03/16/2013   Carpal tunnel syndrome 03/16/2013   Chest pain 03/16/2013   Type 2 diabetes mellitus with left diabetic foot infection ([place]) 03/16/2013   Debility 03/16/2013   Chronic pain syndrome 03/16/2013   Lumbar degenerative disc disease 12/12/2011   Costochondritis 12/12/2011   Diabetic peripheral neuropathy associated with type 2 diabetes mellitus ([place]) 12/12/2011   Past Medical History:  Diagnosis Date   Allergy    Anemia    Anemia    Arthritis    ARTHRITIS IN SPINE - MAKES PT HAVE CHEST PAIN- USES fENTANYL PATCH    Asthma    IN TEENS    Bacteremia    a. 03/2022 Group B Strep bacteremia in setting of diabetic L foot infxn/gangrene/osteo.  CAD (coronary artery disease)    Chronic HFrEF (heart failure with reduced ejection fraction) ([place])    Depression    Diabetes mellitus    Diabetic peripheral neuropathy associated with type 2 diabetes mellitus ([place]) 12/12/2011   Fatty liver disease, nonalcoholic 87/56/4332   Foot osteomyelitis, left ([place])    a. 2012 s/p toe amputations on L; b. 03/2022 - admitted w/ wet gangrene and necrotizing infxn w/ osteomyelitis-->s/p transmetatarsal followed by Lisfranc amputation.   GERD (gastroesophageal  reflux disease)    H/O degenerative disc disease    History of blood transfusion    History of echocardiogram    a. 03/2022 Echo: EF 55-60%, no rwma, nl RV fxn, mildly elev PASP. Mild MR.   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([place])     Family History  Problem Relation Age of Onset   Cancer Mother    Hypertension Father    Diabetes Father    Lung cancer Father    Diabetes Sister    Cancer Sister     Past Surgical History:  Procedure Laterality Date   ACHILLES TENDON SURGERY Left 03/28/2022   Procedure: ACHILLES LENGTHENING/KIDNER;  Surgeon: [name], DPM;  Location: [facility];  Service: Podiatry;  Laterality: Left;   AMPUTATION Left 03/25/2022   Procedure: AMPUTATION DIGITS,TRANSMETARSAL AMPUTATION, REMOVAL OF TOES AND BONE BEHIND TOES ;  Surgeon: [name], DPM;  Location: [facility];  Service: Podiatry;  Laterality: Left;   AMPUTATION Left 07/23/2022   Procedure: LEFT BELOW KNEE AMPUTATION;  Surgeon: [name], MD;  Location: [facility];  Service: Orthopedics;  Laterality: Left;   APPLICATION OF WOUND VAC Left 03/25/2022   Procedure: APPLICATION OF WOUND VAC;  Surgeon: [name], DPM;  Location: [facility];  Service: Podiatry;  Laterality: Left;   CORONARY STENT INTERVENTION N/A 06/05/2022   Procedure: CORONARY STENT INTERVENTION;  Surgeon: Belva Crome, MD;  Location: [facility];  Service: Cardiovascular;  Laterality: N/A;   CYSTOSCOPY/RETROGRADE/URETEROSCOPY Left 10/29/2020   Procedure: CYSTOSCOPY/RETROGRADE/ LEFT URETEROSCOPY WITH BIOPSY, LEFT URETERAL STENT;  Surgeon: Raynelle Bring, MD;  Location: [facility];  Service: Urology;  Laterality: Left;   IRRIGATION AND DEBRIDEMENT FOOT Left 03/28/2022   Procedure: AMPUTATION LISFRANC, SECONDARY WOUND CLOSURE;  Surgeon: [name], DPM;  Location: [facility];  Service: Podiatry;  Laterality: Left;   KNEE SURGERY Bilateral 1975 and 1989   LEFT HEART CATH AND  CORONARY ANGIOGRAPHY N/A 06/05/2022   Procedure: LEFT HEART CATH AND CORONARY ANGIOGRAPHY;  Surgeon: Belva Crome, MD;  Location: [facility];  Service: Cardiovascular;  Laterality: N/A;   MULTIPLE TOOTH EXTRACTIONS     ROBOT ASSITED LAPAROSCOPIC NEPHROURETERECTOMY Left 12/13/2020   Procedure: XI ROBOT ASSITED LAPAROSCOPIC NEPHROURETERECTOMY , POST OPERATIVE INTRAVESICAL GEMCITABINE;  Surgeon: Raynelle Bring, MD;  Location: [facility];  Service: Urology;  Laterality: Left;  ONLY NEEDS 240 MIN   STUMP REVISION Left 09/24/2022   Procedure: REVISION LEFT BELOW KNEE AMPUTATION;  Surgeon: [name], MD;  Location: [facility];  Service: Orthopedics;  Laterality: Left;   TOEM AMPUTATION  Left    2012, second toe   TRANSURETHRAL RESECTION OF BLADDER TUMOR N/A 08/29/2021   Procedure: TRANSURETHRAL RESECTION OF BLADDER TUMOR (TURBT) WITH CYSTOSCOPY;  Surgeon: Raynelle Bring, MD;  Location: [facility];  Service: Urology;  Laterality: N/A;  GENERAL ANESTHESIA WITH PARALYSIS  WISDOM TOOTH EXTRACTION     Social History   Occupational History   Not on file  Tobacco Use   Smoking status: Every Day    Types: Pipe   Smokeless tobacco: Former    Types: Chew   Tobacco comments:    Using pipe until about a week ago (05/2022)    Quit cigarettes 2003ish  Vaping Use   Vaping Use: Never used  Substance and Sexual Activity   Alcohol use: No   Drug use: No   Sexual activity: Not on file

## 2022-10-06 ENCOUNTER — Encounter: Payer: Self-pay | Admitting: Registered Nurse

## 2022-10-06 ENCOUNTER — Encounter: Payer: Medicaid Other | Attending: Registered Nurse | Admitting: Registered Nurse

## 2022-10-06 VITALS — BP 153/84 | HR 78 | Ht 73.0 in

## 2022-10-06 DIAGNOSIS — M940 Chondrocostal junction syndrome [Tietze]: Secondary | ICD-10-CM | POA: Diagnosis present

## 2022-10-06 DIAGNOSIS — G894 Chronic pain syndrome: Secondary | ICD-10-CM | POA: Insufficient documentation

## 2022-10-06 DIAGNOSIS — Z5181 Encounter for therapeutic drug level monitoring: Secondary | ICD-10-CM | POA: Diagnosis present

## 2022-10-06 DIAGNOSIS — Z79899 Other long term (current) drug therapy: Secondary | ICD-10-CM | POA: Insufficient documentation

## 2022-10-06 DIAGNOSIS — Z89512 Acquired absence of left leg below knee: Secondary | ICD-10-CM | POA: Diagnosis present

## 2022-10-06 MED ORDER — FENTANYL 75 MCG/HR TD PT72: 1.0000 | MEDICATED_PATCH | TRANSDERMAL | 0 refills | Status: DC

## 2022-10-06 MED ORDER — OXYCODONE HCL 5 MG PO TABS: 5.0000 mg | ORAL_TABLET | Freq: Three times a day (TID) | ORAL | 0 refills | Status: DC | PRN

## 2022-10-06 NOTE — Progress Notes (Signed)
Subjective:    Patient ID: Robert Burnett, male    DOB: 1961/10/10, 61 y.o.   MRN: [id number]  HPI: Robert Burnett is a 61 y.o. male who returns for follow up appointment for chronic pain and medication refill. He states his pain is located in his ribs and phantom pain. He rates his pain 7. His current exercise regime is walking with walker to bathroom only, otherwise wheelchair bound.   He was admitted to [facility] on 09/24/2022 for Left Dehiscense transtibial amputation and discharged on 09/26/2022. Discharge Summary was reviewed.  On 09/24/2022 he underwent: By Dr. Sharol Given REVISION LEFT BELOW KNEE AMPUTATION Left Choice    Ms. Silfies Morphine equivalent is 210.00 MME.   Last oral swab was Performed on 09/05/2022, it was consistent.      Pain Inventory Average Pain 6 Pain Right Now 7 My pain is constant, sharp, burning, dull, stabbing, tingling, and aching  LOCATION OF PAIN   back, torso  BOWEL Number of stools per week: 3-r Oral laxative use Yes  Type of laxative OTC   BLADDER Normal   Mobility walk without assistance use a walker ability to climb steps?  no do you drive?  no Do you have any goals in this area?  yes  Function I need assistance with the following:  dressing, meal prep, household duties, and shopping Do you have any goals in this area?  yes  Neuro/Psych weakness numbness trouble walking spasms  Prior Studies Any changes since last visit?  no  Physicians involved in your care Any changes since last visit?  no   Family History  Problem Relation Age of Onset   Cancer Mother    Hypertension Father    Diabetes Father    Lung cancer Father    Diabetes Sister    Cancer Sister    Social History   Socioeconomic History   Marital status: Single    Spouse name: Not on file   Number of children: Not on file   Years of education: Not on file   Highest education level: Not on file  Occupational History   Not on file  Tobacco Use    Smoking status: Every Day    Types: Pipe   Smokeless tobacco: Former    Types: Chew   Tobacco comments:    Using pipe until about a week ago (05/2022)    Quit cigarettes 2003ish  Vaping Use   Vaping Use: Never used  Substance and Sexual Activity   Alcohol use: No   Drug use: No   Sexual activity: Not on file  Other Topics Concern   Not on file  Social History Narrative   Lives in [place] by himself.  Sedentary in the setting of chronic arthritic pain impacting his chest/back along w/ L foot amputation.   Social Determinants of Health   Financial Resource Strain: Not on file  Food Insecurity: No Food Insecurity (09/24/2022)   Hunger Vital Sign    Worried About Running Out of Food in the Last Year: Never true    Ran Out of Food in the Last Year: Never true  Transportation Needs: No Transportation Needs (09/24/2022)   PRAPARE - Hydrologist (Medical): No    Lack of Transportation (Non-Medical): No  Physical Activity: Not on file  Stress: Not on file  Social Connections: Not on file   Past Surgical History:  Procedure Laterality Date   ACHILLES TENDON SURGERY Left 03/28/2022  Procedure: ACHILLES LENGTHENING/KIDNER;  Surgeon: [name], DPM;  [facility];  Service: Podiatry;  Laterality: Left;   AMPUTATION Left 03/25/2022   Procedure: AMPUTATION DIGITS,TRANSMETARSAL AMPUTATION, REMOVAL OF TOES AND BONE BEHIND TOES ;  Surgeon: [name], DPM;  [facility];  Service: Podiatry;  Laterality: Left;   AMPUTATION Left 07/23/2022   Procedure: LEFT BELOW KNEE AMPUTATION;  Surgeon: [name], MD;  Location: [facility];  Service: Orthopedics;  Laterality: Left;   APPLICATION OF WOUND VAC Left 03/25/2022   Procedure: APPLICATION OF WOUND VAC;  Surgeon: [name], DPM;  [facility];  Service: Podiatry;  Laterality: Left;   CORONARY STENT INTERVENTION N/A 06/05/2022   Procedure: CORONARY STENT INTERVENTION;  Surgeon: Belva Crome, MD;   Location: [facility];  Service: Cardiovascular;  Laterality: N/A;   CYSTOSCOPY/RETROGRADE/URETEROSCOPY Left 10/29/2020   Procedure: CYSTOSCOPY/RETROGRADE/ LEFT URETEROSCOPY WITH BIOPSY, LEFT URETERAL STENT;  Surgeon: Raynelle Bring, MD;  [facility];  Service: Urology;  Laterality: Left;   IRRIGATION AND DEBRIDEMENT FOOT Left 03/28/2022   Procedure: AMPUTATION LISFRANC, SECONDARY WOUND CLOSURE;  Surgeon: [name], DPM;  [facility];  Service: Podiatry;  Laterality: Left;   KNEE SURGERY Bilateral 1975 and 1989   LEFT HEART CATH AND CORONARY ANGIOGRAPHY N/A 06/05/2022   Procedure: LEFT HEART CATH AND CORONARY ANGIOGRAPHY;  Surgeon: Belva Crome, MD;  Location: [facility];  Service: Cardiovascular;  Laterality: N/A;   MULTIPLE TOOTH EXTRACTIONS     ROBOT ASSITED LAPAROSCOPIC NEPHROURETERECTOMY Left 12/13/2020   Procedure: XI ROBOT ASSITED LAPAROSCOPIC NEPHROURETERECTOMY , POST OPERATIVE INTRAVESICAL GEMCITABINE;  Surgeon: Raynelle Bring, MD;  [facility];  Service: Urology;  Laterality: Left;  ONLY NEEDS 240 MIN   STUMP REVISION Left 09/24/2022   Procedure: REVISION LEFT BELOW KNEE AMPUTATION;  Surgeon: [name], MD;  Location: [facility];  Service: Orthopedics;  Laterality: Left;   TOEM AMPUTATION  Left    2012, second toe   TRANSURETHRAL RESECTION OF BLADDER TUMOR N/A 08/29/2021   Procedure: TRANSURETHRAL RESECTION OF BLADDER TUMOR (TURBT) WITH CYSTOSCOPY;  Surgeon: Raynelle Bring, MD;  [facility];  Service: Urology;  Laterality: N/A;  GENERAL ANESTHESIA WITH PARALYSIS   WISDOM TOOTH EXTRACTION     Past Medical History:  Diagnosis Date   Allergy    Anemia    Anemia    Arthritis    ARTHRITIS IN SPINE - MAKES PT HAVE CHEST PAIN- USES fENTANYL PATCH    Asthma    IN TEENS    Bacteremia    a. 03/2022 Group B Strep bacteremia in setting of diabetic L foot infxn/gangrene/osteo.   CAD (coronary artery disease)    Chronic HFrEF (heart failure with  reduced ejection fraction) ([place])    Depression    Diabetes mellitus    Diabetic peripheral neuropathy associated with type 2 diabetes mellitus ([place]) 12/12/2011   Fatty liver disease, nonalcoholic 16/07/9603   Foot osteomyelitis, left ([place])    a. 2012 s/p toe amputations on L; b. 03/2022 - admitted w/ wet gangrene and necrotizing infxn w/ osteomyelitis-->s/p transmetatarsal followed by Lisfranc amputation.   GERD (gastroesophageal reflux disease)    H/O degenerative disc disease    History of blood transfusion    History of echocardiogram    a. 03/2022 Echo: EF 55-60%, no rwma, nl RV fxn, mildly elev PASP. Mild MR.   Hyperlipidemia    Hypertension    Pneumonia    HX OF X  3    Tobacco abuse    Urothelial cancer (HCC)    BP (!) 164/91   Pulse 78   Ht '6\' 1"'$  (1.854 m)   SpO2 99%   BMI 23.75 kg/m   Opioid Risk Score:   Fall Risk Score:  `1  Depression screen [facility] 2/9     10/06/2022   10:54 AM 08/14/2022   11:30 AM 04/30/2022    9:49 AM 01/06/2019    9:27 AM 05/24/2018   11:35 AM 02/01/2018   11:54 AM 10/10/2017   10:58 AM  Depression screen PHQ 2/9  Decreased Interest 0 0 3 0 0 0 0  Down, Depressed, Hopeless 0 0 1 0 0 0 0  PHQ - 2 Score 0 0 4 0 0 0 0  Altered sleeping  0 0      Tired, decreased energy  0 3      Change in appetite  0 1      Feeling bad or failure about yourself   0 0      Trouble concentrating  0 0      Moving slowly or fidgety/restless  0 0      Suicidal thoughts  0 0      PHQ-9 Score  0 8        Review of Systems  Musculoskeletal:  Positive for back pain and gait problem.  Neurological:  Positive for weakness and numbness.       SPASMS  All other systems reviewed and are negative.      Objective:   Physical Exam Vitals and nursing note reviewed.  Constitutional:      Appearance: Normal appearance.  Cardiovascular:     Rate and Rhythm: Normal rate and regular rhythm.     Pulses: Normal pulses.     Heart sounds: Normal heart sounds.  Pulmonary:      Effort: Pulmonary effort is normal.     Breath sounds: Normal breath sounds.  Musculoskeletal:     Cervical back: Normal range of motion and neck supple.     Comments: Normal Muscle Bulk and Muscle Testing Reveals:  Upper Extremities: Full ROM and Muscle Strength 5/5 Lower Extremities: Right: Full ROM and Muscle Strength 5/5 Left BKA: Dressing change: Sutures Intact Arrived in wheelchair      Skin:    General: Skin is warm and dry.  Neurological:     Mental Status: He is alert and oriented to person, place, and time.  Psychiatric:        Mood and Affect: Mood normal.        Behavior: Behavior normal.         Assessment & Plan:  S/P BKA left,: On 09/24/2022 he underwent: By Dr. Sharol Given REVISION LEFT BELOW KNEE AMPUTATION Left Choice  Ortho Following. Continue to Monitor. 10/06/2022  Type 2 DM with long-term insulin use: Continue current medication: PCP Following, Continue to Monitor 10/06/2022 ,Essential Hypertension: Continue current medication regimen. PCP Following. Continue to Monitor. 10/06/2022  Hyperlipidemia.: Continue current medication regimen. PCP following. Continue to Monitor. 10/06/2022 Chronic Costochondritis: Chest/ Back Pain Chronic Pain Syndrome: Refilled Fentanyl 73mg one patch every three days #10. We will continue the opioid monitoring program, this consists of regular clinic visits, examinations, urine drug screen, pill counts as well [place]. A 12 month History has been reviewed on the NPierceon  10/06/2022 Post-Operative Pain : Continue Gabapentin. Continue to monitor. Refilled:  Continue slow weaning.  Oxycodone '5mg'$   one tablet 3 times a day as needed for pain # 90 :   Phantom Pain: Continue Gabapentin. Continue to Monitor.    F/U in 1 month

## 2022-10-07 ENCOUNTER — Ambulatory Visit: Payer: Self-pay | Admitting: *Deleted

## 2022-10-07 DIAGNOSIS — R12 Heartburn: Secondary | ICD-10-CM

## 2022-10-07 MED ORDER — METHOCARBAMOL 750 MG PO TABS: 750.0000 mg | ORAL_TABLET | Freq: Three times a day (TID) | ORAL | 0 refills | Status: DC | PRN

## 2022-10-07 MED ORDER — DAPAGLIFLOZIN PROPANEDIOL 10 MG PO TABS: 10.0000 mg | ORAL_TABLET | Freq: Every day | ORAL | 1 refills | Status: DC

## 2022-10-07 MED ORDER — METOPROLOL SUCCINATE ER 25 MG PO TB24: 25.0000 mg | ORAL_TABLET | Freq: Every day | ORAL | 1 refills | Status: DC

## 2022-10-07 MED ORDER — CLOPIDOGREL BISULFATE 75 MG PO TABS: 75.0000 mg | ORAL_TABLET | Freq: Every day | ORAL | 1 refills | Status: DC

## 2022-10-07 MED ORDER — METOPROLOL SUCCINATE ER 25 MG PO TB24: 25.0000 mg | ORAL_TABLET | Freq: Three times a day (TID) | ORAL | 0 refills | Status: DC

## 2022-10-07 MED ORDER — ATORVASTATIN CALCIUM 80 MG PO TABS: 80.0000 mg | ORAL_TABLET | Freq: Every day | ORAL | 1 refills | Status: DC

## 2022-10-07 MED ORDER — LOSARTAN POTASSIUM 50 MG PO TABS: 50.0000 mg | ORAL_TABLET | Freq: Every day | ORAL | 1 refills | Status: DC

## 2022-10-07 MED ORDER — PANTOPRAZOLE SODIUM 40 MG PO TBEC: 40.0000 mg | DELAYED_RELEASE_TABLET | Freq: Every day | ORAL | 0 refills | Status: DC

## 2022-10-07 MED ORDER — GABAPENTIN 100 MG PO CAPS: 200.0000 mg | ORAL_CAPSULE | Freq: Three times a day (TID) | ORAL | 3 refills | Status: DC

## 2022-10-07 NOTE — Addendum Note (Signed)
Addended by: Charlott Rakes on: 10/07/2022 05:31 PM   Modules accepted: Orders

## 2022-10-07 NOTE — Telephone Encounter (Signed)
Pt is requesting refills.

## 2022-10-07 NOTE — Telephone Encounter (Signed)
Prescriptions have been sent to the pharmacy.

## 2022-10-07 NOTE — Telephone Encounter (Signed)
Reason for Disposition  [1] Pharmacy calling with prescription question AND [2] triager unable to answer question  Answer Assessment - Initial Assessment Questions 1. NAME of MEDICINE: "What medicine(s) are you calling about?"     New pt to Dr. Margarita Rana.   He needs refills.   Had surgery in Sept. He thought Dr. Margarita Rana was going to start his medications.  Chris with [facility] calling in  2. QUESTION: "What is your question?" (e.g., double dose of medicine, side effect)      I have medications I want to see if you have on his list.  Pharmacy is requesting refills on all of these if Dr. Margarita Rana approves them.   It's [facility].     Pt would like a call letting him know what Dr. Smitty Pluck decision is.    Thanks.  Plavix 75 mg daily Metoprolol 25 mg  3 times/day Gabapentin 100 mg  2 caplets 3 times a day Atorvastatin 80 mg daily Protonix 40 mg daily Robaxin 750 every 8 hours as needed All last prescribed 08/06/2022 for 30 days, 0 refills  OTC medicines he is taking but wants to see if insurance will pay for them if they are prescribed: Magnesium Oxide 400 mg daily Vitamin C 1,000 mg daily  3. PRESCRIBER: "Who prescribed the medicine?" Reason: if prescribed by specialist, call should be referred to that group.     Dr. Margarita Rana 4. SYMPTOMS: "Do you have any symptoms?" If Yes, ask: "What symptoms are you having?"  "How bad are the symptoms (e.g., mild, moderate, severe)     N/A 5. PREGNANCY:  "Is there any chance that you are pregnant?" "When was your last menstrual period?"   N/A  Protocols used: Medication Question Call-A-AH

## 2022-10-08 NOTE — Telephone Encounter (Signed)
Called and informed of medications being refilled.

## 2022-10-15 ENCOUNTER — Ambulatory Visit: Payer: Commercial Managed Care - HMO | Admitting: Family

## 2022-10-15 ENCOUNTER — Telehealth: Payer: Self-pay

## 2022-10-15 NOTE — Telephone Encounter (Signed)
OXYCODONE 5 MG PA SENT:  You will receive a fax copy of the determination. If Robert Burnett has not responded in 120 hours, contact [employer] at [phone].

## 2022-10-17 ENCOUNTER — Ambulatory Visit: Payer: Commercial Managed Care - HMO | Admitting: Orthopedic Surgery

## 2022-10-23 ENCOUNTER — Ambulatory Visit (INDEPENDENT_AMBULATORY_CARE_PROVIDER_SITE_OTHER): Payer: Medicaid Other | Admitting: Orthopedic Surgery

## 2022-10-23 DIAGNOSIS — S88112A Complete traumatic amputation at level between knee and ankle, left lower leg, initial encounter: Secondary | ICD-10-CM

## 2022-10-23 DIAGNOSIS — Z89512 Acquired absence of left leg below knee: Secondary | ICD-10-CM

## 2022-10-24 ENCOUNTER — Encounter: Payer: Self-pay | Admitting: Orthopedic Surgery

## 2022-10-24 NOTE — Progress Notes (Signed)
Office Visit Note   Patient: Robert Burnett           Date of Birth: 1961/02/23           MRN: [id number] Visit Date: 10/23/2022              Requested by: Charlott Rakes, MD [address] [place],  [place] [address] PCP: Charlott Rakes, MD  Chief Complaint  Patient presents with   Left Leg - Routine Post Op    09/24/2022 revision left BKA       HPI: Patient is a 62 year old gentleman who is 2 weeks status post revision left below-knee amputation currently using Dial soap cleansing shrinker and elevation.  Patient states he has a small abrasion proximally on the leg.  Assessment & Plan: Visit Diagnoses:  1. Below-knee amputation of left lower extremity ([place])     Plan: Sutures harvested continue with the stump shrinker.  Follow-Up Instructions: Return in about 2 weeks (around 11/06/2022).   Ortho Exam  Patient is alert, oriented, no adenopathy, well-dressed, normal affect, normal respiratory effort. Examination the incision is well-healed sutures are harvested.  He has abrasion proximally over the tibia this should resolve uneventfully.  Imaging: No results found.   Labs: Lab Results  Component Value Date   HGBA1C 6.8 (H) 07/22/2022   HGBA1C 14.2 (H) 03/25/2022   HGBA1C 11.2 (H) 08/23/2021   ESRSEDRATE 124 (H) 07/22/2022   ESRSEDRATE 115 (H) 03/25/2022   ESRSEDRATE 4 06/01/2014   CRP 26.5 (H) 07/22/2022   CRP 21.4 (H) 03/25/2022   CRP <0.5 06/01/2014   REPTSTATUS 09/29/2022 FINAL 09/24/2022   GRAMSTAIN NO WBC SEEN NO ORGANISMS SEEN  09/24/2022   CULT  09/24/2022    RARE PROTEUS VULGARIS RARE ENTEROCOCCUS FAECALIS NO ANAEROBES ISOLATED Performed at [facility], [place] [address] [place], [place] [address]    [facility] ENTEROCOCCUS FAECALIS 09/24/2022   LABORGA PROTEUS VULGARIS 09/24/2022     Lab Results  Component Value Date   ALBUMIN 3.6 (L) 08/27/2022   ALBUMIN 1.8 (L) 07/26/2022   ALBUMIN 2.3 (L) 07/21/2022   PREALBUMIN 5 (L)  07/23/2022   PREALBUMIN <5 (L) 03/25/2022    Lab Results  Component Value Date   MG 1.7 07/26/2022   MG 1.9 06/05/2022   MG 1.6 (L) 06/04/2022   Lab Results  Component Value Date   VD25OH 17.42 (L) 07/28/2022    Lab Results  Component Value Date   PREALBUMIN 5 (L) 07/23/2022   PREALBUMIN <5 (L) 03/25/2022      Latest Ref Rng & Units 09/26/2022    4:18 AM 09/25/2022    3:48 AM 09/24/2022    9:52 AM  CBC EXTENDED  WBC 4.0 - 10.5 K/uL 5.3  8.3  5.9   RBC 4.22 - 5.81 MIL/uL 2.94  3.41  4.06   Hemoglobin 13.0 - 17.0 g/dL 7.8  8.8  10.4   HCT 39.0 - 52.0 % 23.6  27.7  33.4   Platelets 150 - 400 K/uL 121  179  174      There is no height or weight on file to calculate BMI.  Orders:  No orders of the defined types were placed in this encounter.  No orders of the defined types were placed in this encounter.    Procedures: No procedures performed  Clinical Data: No additional findings.  ROS:  All other systems negative, except as noted in the HPI. Review of Systems  Objective: Vital  Signs: There were no vitals taken for this visit.  Specialty Comments:  No specialty comments available.  PMFS History: Patient Active Problem List   Diagnosis Date Noted   Dehiscence of amputation stump ([place]) 09/24/2022   S/P BKA (below knee amputation) unilateral, left ([place]) 09/24/2022   Hx of BKA, left ([place])    Osteomyelitis of left lower extremity ([place]) 07/25/2022   Cutaneous abscess of left ankle 07/23/2022   Cellulitis of left foot    Foot osteomyelitis, left ([place]) 07/22/2022   CAD S/P percutaneous coronary angioplasty 07/22/2022   Ischemic cardiomyopathy 07/22/2022   CKD (chronic kidney disease) stage 2, GFR 60-89 ml/min 07/22/2022   Normocytic anemia 07/22/2022   Tobacco abuse 07/22/2022   PAF (paroxysmal atrial fibrillation) ([place]) 07/22/2022   Hyponatremia 07/22/2022   NSTEMI (non-ST elevated myocardial infarction) ([place]) 06/04/2022   Type 2 diabetes mellitus with  complication, with long-term current use of insulin ([place]) 06/04/2022   Hypomagnesemia 06/04/2022   Hyperlipidemia 06/04/2022   Diabetic ulcer of left foot ([place]) 06/04/2022   History of transmetatarsal amputation of foot ([place]) 04/30/2022   Gangrene of left foot ([place]) 03/25/2022   Hypokalemia 03/25/2022   Severe protein-calorie malnutrition ([place]) 03/25/2022   Pyogenic inflammation of bone (HCC)    Necrotizing soft tissue infection    Abscess of tendon sheath, left ankle and foot    Lateral epicondylitis of left elbow 08/01/2021   Ureteral cancer, 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   Proteinuria 09/19/2018   Hematuria 09/19/2018   Anterior cervical adenopathy 09/19/2018   Fatty liver disease, nonalcoholic 95/06/3266   Hypertension 06/27/2016   Thoracic spondylosis with myelopathy 03/16/2013   Carpal tunnel syndrome 03/16/2013   Chest pain 03/16/2013   Type 2 diabetes mellitus with left diabetic foot infection ([place]) 03/16/2013   Debility 03/16/2013   Chronic pain syndrome 03/16/2013   Lumbar degenerative disc disease 12/12/2011   Costochondritis 12/12/2011   Diabetic peripheral neuropathy associated with type 2 diabetes mellitus ([place]) 12/12/2011   Past Medical History:  Diagnosis Date   Allergy    Anemia    Anemia    Arthritis    ARTHRITIS IN SPINE - MAKES PT HAVE CHEST PAIN- USES fENTANYL PATCH    Asthma    IN TEENS    Bacteremia    a. 03/2022 Group B Strep bacteremia in setting of diabetic L foot infxn/gangrene/osteo.   CAD (coronary artery disease)    Chronic HFrEF (heart failure with reduced ejection fraction) ([place])    Depression    Diabetes mellitus    Diabetic peripheral neuropathy associated with type 2 diabetes mellitus ([place]) 12/12/2011   Fatty liver disease, nonalcoholic 12/45/8099   Foot osteomyelitis, left ([place])    a. 2012 s/p toe amputations on L; b. 03/2022 - admitted w/ wet gangrene and necrotizing infxn w/ osteomyelitis-->s/p transmetatarsal followed by Lisfranc amputation.    GERD (gastroesophageal reflux disease)    H/O degenerative disc disease    History of blood transfusion    History of echocardiogram    a. 03/2022 Echo: EF 55-60%, no rwma, nl RV fxn, mildly elev PASP. Mild MR.   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([place])     Family History  Problem Relation Age of Onset   Cancer Mother    Hypertension Father    Diabetes Father    Lung cancer Father    Diabetes Sister    Cancer  Sister     Past Surgical History:  Procedure Laterality Date   ACHILLES TENDON SURGERY Left 03/28/2022   Procedure: ACHILLES LENGTHENING/KIDNER;  Surgeon: [name], DPM;  [facility];  Service: Podiatry;  Laterality: Left;   AMPUTATION Left 03/25/2022   Procedure: AMPUTATION DIGITS,TRANSMETARSAL AMPUTATION, REMOVAL OF TOES AND BONE BEHIND TOES ;  Surgeon: [name], DPM;  [facility];  Service: Podiatry;  Laterality: Left;   AMPUTATION Left 07/23/2022   Procedure: LEFT BELOW KNEE AMPUTATION;  Surgeon: [name], MD;  Location: [facility];  Service: Orthopedics;  Laterality: Left;   APPLICATION OF WOUND VAC Left 03/25/2022   Procedure: APPLICATION OF WOUND VAC;  Surgeon: [name], DPM;  [facility];  Service: Podiatry;  Laterality: Left;   CORONARY STENT INTERVENTION N/A 06/05/2022   Procedure: CORONARY STENT INTERVENTION;  Surgeon: Belva Crome, MD;  Location: [facility];  Service: Cardiovascular;  Laterality: N/A;   CYSTOSCOPY/RETROGRADE/URETEROSCOPY Left 10/29/2020   Procedure: CYSTOSCOPY/RETROGRADE/ LEFT URETEROSCOPY WITH BIOPSY, LEFT URETERAL STENT;  Surgeon: Raynelle Bring, MD;  [facility];  Service: Urology;  Laterality: Left;   IRRIGATION AND DEBRIDEMENT FOOT Left 03/28/2022   Procedure: AMPUTATION LISFRANC, SECONDARY WOUND CLOSURE;  Surgeon: [name], DPM;  [facility];  Service: Podiatry;  Laterality: Left;   KNEE SURGERY Bilateral 1975 and 1989    LEFT HEART CATH AND CORONARY ANGIOGRAPHY N/A 06/05/2022   Procedure: LEFT HEART CATH AND CORONARY ANGIOGRAPHY;  Surgeon: Belva Crome, MD;  Location: [facility];  Service: Cardiovascular;  Laterality: N/A;   MULTIPLE TOOTH EXTRACTIONS     ROBOT ASSITED LAPAROSCOPIC NEPHROURETERECTOMY Left 12/13/2020   Procedure: XI ROBOT ASSITED LAPAROSCOPIC NEPHROURETERECTOMY , POST OPERATIVE INTRAVESICAL GEMCITABINE;  Surgeon: Raynelle Bring, MD;  [facility];  Service: Urology;  Laterality: Left;  ONLY NEEDS 240 MIN   STUMP REVISION Left 09/24/2022   Procedure: REVISION LEFT BELOW KNEE AMPUTATION;  Surgeon: [name], MD;  Location: [facility];  Service: Orthopedics;  Laterality: Left;   TOEM AMPUTATION  Left    2012, second toe   TRANSURETHRAL RESECTION OF BLADDER TUMOR N/A 08/29/2021   Procedure: TRANSURETHRAL RESECTION OF BLADDER TUMOR (TURBT) WITH CYSTOSCOPY;  Surgeon: Raynelle Bring, MD;  [facility];  Service: Urology;  Laterality: N/A;  GENERAL ANESTHESIA WITH PARALYSIS   WISDOM TOOTH EXTRACTION     Social History   Occupational History   Not on file  Tobacco Use   Smoking status: Every Day    Types: Pipe   Smokeless tobacco: Former    Types: Chew   Tobacco comments:    Using pipe until about a week ago (05/2022)    Quit cigarettes 2003ish  Vaping Use   Vaping Use: Never used  Substance and Sexual Activity   Alcohol use: No   Drug use: No   Sexual activity: Not on file

## 2022-10-29 ENCOUNTER — Ambulatory Visit: Payer: Medicaid Other | Attending: Family Medicine | Admitting: Family Medicine

## 2022-10-29 ENCOUNTER — Encounter: Payer: Self-pay | Admitting: Family Medicine

## 2022-10-29 VITALS — BP 190/72 | HR 104 | Ht 73.0 in

## 2022-10-29 DIAGNOSIS — G894 Chronic pain syndrome: Secondary | ICD-10-CM

## 2022-10-29 DIAGNOSIS — Z1211 Encounter for screening for malignant neoplasm of colon: Secondary | ICD-10-CM

## 2022-10-29 DIAGNOSIS — I152 Hypertension secondary to endocrine disorders: Secondary | ICD-10-CM

## 2022-10-29 DIAGNOSIS — I480 Paroxysmal atrial fibrillation: Secondary | ICD-10-CM | POA: Diagnosis not present

## 2022-10-29 DIAGNOSIS — L089 Local infection of the skin and subcutaneous tissue, unspecified: Secondary | ICD-10-CM

## 2022-10-29 DIAGNOSIS — E11628 Type 2 diabetes mellitus with other skin complications: Secondary | ICD-10-CM

## 2022-10-29 DIAGNOSIS — I2510 Atherosclerotic heart disease of native coronary artery without angina pectoris: Secondary | ICD-10-CM

## 2022-10-29 DIAGNOSIS — Z89512 Acquired absence of left leg below knee: Secondary | ICD-10-CM | POA: Diagnosis not present

## 2022-10-29 DIAGNOSIS — Z720 Tobacco use: Secondary | ICD-10-CM

## 2022-10-29 DIAGNOSIS — C662 Malignant neoplasm of left ureter: Secondary | ICD-10-CM | POA: Diagnosis not present

## 2022-10-29 DIAGNOSIS — E1159 Type 2 diabetes mellitus with other circulatory complications: Secondary | ICD-10-CM

## 2022-10-29 DIAGNOSIS — Z9861 Coronary angioplasty status: Secondary | ICD-10-CM

## 2022-10-29 LAB — POCT GLYCOSYLATED HEMOGLOBIN (HGB A1C): HbA1c, POC (controlled diabetic range): 6.4 % (ref 0.0–7.0)

## 2022-10-29 MED ORDER — NICOTINE 7 MG/24HR TD PT24: MEDICATED_PATCH | TRANSDERMAL | 1 refills | Status: DC

## 2022-10-29 MED ORDER — APIXABAN 5 MG PO TABS: 5.0000 mg | ORAL_TABLET | Freq: Two times a day (BID) | ORAL | 1 refills | Status: DC

## 2022-10-29 MED ORDER — NALOXONE HCL 4 MG/0.1ML NA LIQD: 1.0000 | Freq: Once | NASAL | 1 refills | Status: AC

## 2022-10-29 NOTE — Patient Instructions (Signed)
Smoking cessation support: smoking cessation hotline: 1-800-QUIT-NOW.  Smoking cessation classes are available through [facility]. Call [phone] or visit our website at [email or web address]

## 2022-10-29 NOTE — Progress Notes (Signed)
Subjective:  Patient ID: Robert Burnett, male    DOB: Apr 16, 1961  Age: 62 y.o. MRN: [id number]  CC: Hospitalization Follow-up   HPI Robert RYNER is a 62 y.o. 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  Interval History: Since discharge, he has followed up with Dr. Sharol Given with his last visit last month and he is currently wearing a stump shrinker.  He states his SBP was 126 this morning but in the clinic his blood pressure is elevated at 190/72. He is yet to take his antihypertensive this morning.  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 He has been on this regimen for 9 years.  Diabetes is controlled with no hypoglycemic episodes.  Neuropathy is controlled on gabapentin and he has no visual concerns.  He is not up-to-date on annual eye exams.  From a cardiac standpoint, he is stable with no dizziness or palpitations, no dyspnea, no chest pains, no pedal edema.  Last seen by cardiology 06/2022 and per notes he needs to remain on Eliquis and Plavix. He smokes a pipe and has smoked x4 years.  Thinking of quitting. He denies presence of additional concerns today. Past Medical History:  Diagnosis Date   Allergy    Anemia    Anemia    Arthritis    ARTHRITIS IN SPINE - MAKES PT HAVE CHEST PAIN- USES fENTANYL PATCH    Asthma    IN TEENS    Bacteremia    a. 03/2022 Group B Strep bacteremia in setting of diabetic L foot infxn/gangrene/osteo.   CAD (coronary artery disease)    Chronic HFrEF (heart failure with reduced ejection fraction)  ([place])    Depression    Diabetes mellitus    Diabetic peripheral neuropathy associated with type 2 diabetes mellitus ([place]) 12/12/2011   Fatty liver disease, nonalcoholic 09/38/1829   Foot osteomyelitis, left ([place])    a. 2012 s/p toe amputations on L; b. 03/2022 - admitted w/ wet gangrene and necrotizing infxn w/ osteomyelitis-->s/p transmetatarsal followed by Lisfranc amputation.   GERD (gastroesophageal reflux disease)    H/O degenerative disc disease    History of blood transfusion    History of echocardiogram    a. 03/2022 Echo: EF 55-60%, no rwma, nl RV fxn, mildly elev PASP. Mild MR.   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[facility])     Past Surgical History:  Procedure Laterality Date   ACHILLES TENDON SURGERY Left 03/28/2022   Procedure: ACHILLES LENGTHENING/KIDNER;  Surgeon: [name], DPM;  Location: [facility];  Service: Podiatry;  Laterality: Left;   AMPUTATION Left 03/25/2022   Procedure: AMPUTATION DIGITS,TRANSMETARSAL AMPUTATION, REMOVAL OF TOES AND BONE BEHIND TOES ;  Surgeon: [name], DPM;  Location: [facility];  Service: Podiatry;  Laterality: Left;   AMPUTATION Left 07/23/2022   Procedure: LEFT BELOW KNEE AMPUTATION;  Surgeon: [name], MD;  Location: [place] [facility];  Service: Orthopedics;  Laterality: Left;   APPLICATION OF WOUND  VAC Left 03/25/2022   Procedure: APPLICATION OF WOUND VAC;  Surgeon: [name], DPM;  [facility];  Service: Podiatry;  Laterality: Left;   CORONARY STENT INTERVENTION N/A 06/05/2022   Procedure: CORONARY STENT INTERVENTION;  Surgeon: Belva Crome, MD;  Location: [facility];  Service: Cardiovascular;  Laterality: N/A;   CYSTOSCOPY/RETROGRADE/URETEROSCOPY Left 10/29/2020   Procedure: CYSTOSCOPY/RETROGRADE/ LEFT URETEROSCOPY WITH BIOPSY, LEFT URETERAL STENT;  Surgeon: Raynelle Bring, MD;  [facility];  Service: Urology;  Laterality: Left;   IRRIGATION AND DEBRIDEMENT  FOOT Left 03/28/2022   Procedure: AMPUTATION LISFRANC, SECONDARY WOUND CLOSURE;  Surgeon: [name], DPM;  [facility];  Service: Podiatry;  Laterality: Left;   KNEE SURGERY Bilateral 1975 and 1989   LEFT HEART CATH AND CORONARY ANGIOGRAPHY N/A 06/05/2022   Procedure: LEFT HEART CATH AND CORONARY ANGIOGRAPHY;  Surgeon: Belva Crome, MD;  Location: [place] [facility];  Service: Cardiovascular;  Laterality: N/A;   MULTIPLE TOOTH EXTRACTIONS     ROBOT ASSITED LAPAROSCOPIC NEPHROURETERECTOMY Left 12/13/2020   Procedure: XI ROBOT ASSITED LAPAROSCOPIC NEPHROURETERECTOMY , POST OPERATIVE INTRAVESICAL GEMCITABINE;  Surgeon: Raynelle Bring, MD;  [facility];  Service: Urology;  Laterality: Left;  ONLY NEEDS 240 MIN   STUMP REVISION Left 09/24/2022   Procedure: REVISION LEFT BELOW KNEE AMPUTATION;  Surgeon: [name], MD;  Location: [facility];  Service: Orthopedics;  Laterality: Left;   TOEM AMPUTATION  Left    2012, second toe   TRANSURETHRAL RESECTION OF BLADDER TUMOR N/A 08/29/2021   Procedure: TRANSURETHRAL RESECTION OF BLADDER TUMOR (TURBT) WITH CYSTOSCOPY;  Surgeon: Raynelle Bring, MD;  [facility];  Service: Urology;  Laterality: N/A;  GENERAL ANESTHESIA WITH PARALYSIS   WISDOM TOOTH EXTRACTION      Family History  Problem Relation Age of Onset   Cancer Mother    Hypertension Father    Diabetes Father    Lung cancer Father    Diabetes Sister    Cancer Sister     Social History   Socioeconomic History   Marital status: Single    Spouse name: Not on file   Number of children: Not on file   Years of education: Not on file   Highest education level: Not on file  Occupational History   Not on file  Tobacco Use   Smoking status: Every Day    Types: Pipe   Smokeless tobacco: Former    Types: Chew   Tobacco comments:    Using pipe until about a week ago (05/2022)    Quit cigarettes 2003ish  Vaping Use   Vaping Use: Never used  Substance and Sexual Activity    Alcohol use: No   Drug use: No   Sexual activity: Not on file  Other Topics Concern   Not on file  Social History Narrative   Lives in [place] by himself.  Sedentary in the setting of chronic arthritic pain impacting his chest/back along w/ L foot amputation.   Social Determinants of Health   Financial Resource Strain: Not on file  Food Insecurity: No Food Insecurity (09/24/2022)   Hunger Vital Sign    Worried About Running Out of Food in the Last Year: Never true    Ran Out of Food in the Last Year: Never true  Transportation Needs: No Transportation Needs (09/24/2022)   PRAPARE - Hydrologist (Medical): No    Lack of Transportation (Non-Medical): No  Physical Activity: Not on  file  Stress: Not on file  Social Connections: Not on file    Allergies  Allergen Reactions   Amitriptyline Hcl     Confusion   Baclofen    Cymbalta [Duloxetine Hcl]     confusion   Amlodipine Rash   Prozac [Fluoxetine Hcl] Rash    Outpatient Medications Prior to Visit  Medication Sig Dispense Refill   acetaminophen (TYLENOL) 325 MG tablet Take 1-2 tablets (325-650 mg total) by mouth every 4 (four) hours as needed for mild pain.     ascorbic acid (VITAMIN C) 1000 MG tablet Take 1 tablet (1,000 mg total) by mouth daily. 30 tablet 0   atorvastatin (LIPITOR) 80 MG tablet Take 1 tablet (80 mg total) by mouth daily. 90 tablet 1   clopidogrel (PLAVIX) 75 MG tablet Take 1 tablet (75 mg total) by mouth daily. 90 tablet 1   dapagliflozin propanediol (FARXIGA) 10 MG TABS tablet Take 1 tablet (10 mg total) by mouth daily before breakfast. 90 tablet 1   diclofenac Sodium (VOLTAREN) 1 % GEL Apply 2 g topically 4 (four) times daily as needed (Left hip pain). 200 g 0   docusate sodium (COLACE) 100 MG capsule Take 1 capsule (100 mg total) by mouth daily. (Patient taking differently: Take 100 mg by mouth daily as needed.) 10 capsule 0   fentaNYL (DURAGESIC) 75 MCG/HR Place 1 patch onto the  skin every 3 (three) days. 10 patch 0   gabapentin (NEURONTIN) 100 MG capsule Take 2 capsules (200 mg total) by mouth 3 (three) times daily. 180 capsule 3   insulin NPH-regular Human (70-30) 100 UNIT/ML injection Inject 10 Units into the skin 2 (two) times daily with a meal. RELION BRAND PLEASE (Patient taking differently: Inject 10 Units into the skin See admin instructions. Pt uses this as needed according to blood sugar levels.  RELION BRAND PLEASE "Takes if needed") 10 mL 11   Lancets (ONETOUCH DELICA PLUS [id number]) Banks Apply topically.     losartan (COZAAR) 50 MG tablet Take 1 tablet (50 mg total) by mouth daily. 90 tablet 1   magnesium oxide (MAG-OX) 400 MG tablet Take 0.5 tablets (200 mg total) by mouth at bedtime. 30 tablet 0   methocarbamol (ROBAXIN) 750 MG tablet Take 1 tablet (750 mg total) by mouth every 8 (eight) hours as needed for muscle spasms. 60 tablet 0   metoprolol succinate (TOPROL-XL) 25 MG 24 hr tablet Take 1 tablet (25 mg total) by mouth daily. 90 tablet 1   ONETOUCH ULTRA test strip      oxyCODONE (OXY IR/ROXICODONE) 5 MG immediate release tablet Take 1 tablet (5 mg total) by mouth 3 (three) times daily as needed for moderate pain. Do not Fill Before 12/ 21/2023. 90 tablet 0   pantoprazole (PROTONIX) 40 MG tablet Take 1 tablet (40 mg total) by mouth daily. 30 tablet 0   polyethylene glycol (MIRALAX / GLYCOLAX) 17 g packet Take 17 g by mouth daily. 14 each 0   Vitamin D, Ergocalciferol, (DRISDOL) 1.25 MG (50000 UNIT) CAPS capsule Take 1 capsule (50,000 Units total) by mouth every 7 (seven) days. 5 capsule 0   apixaban (ELIQUIS) 5 MG TABS tablet Take 1 tablet (5 mg total) by mouth 2 (two) times daily. 60 tablet 0   furosemide (LASIX) 40 MG tablet Take 1 tablet (40 mg total) by mouth daily as needed for edema or fluid (Take 1 tablet if your weight increases more than 2 pounds in 24 hours.). 10 tablet 0  amoxicillin-clavulanate (AUGMENTIN) 875-125 MG tablet Take 1 tablet by  mouth 2 (two) times daily. (Patient not taking: Reported on 10/29/2022) 20 tablet 0   bisacodyl (DULCOLAX) 5 MG EC tablet Take 1 tablet (5 mg total) by mouth daily as needed for moderate constipation. (Patient not taking: Reported on 10/29/2022) 30 tablet 0   cephALEXin (KEFLEX) 500 MG capsule Take 500 mg by mouth 4 (four) times daily. (Patient not taking: Reported on 10/29/2022)     ciprofloxacin (CIPRO) 500 MG tablet Take 1 tablet (500 mg total) by mouth 2 (two) times daily. (Patient not taking: Reported on 10/29/2022) 20 tablet 0   doxycycline (VIBRA-TABS) 100 MG tablet Take 1 tablet (100 mg total) by mouth 2 (two) times daily. (Patient not taking: Reported on 10/06/2022) 30 tablet 0   ezetimibe (ZETIA) 10 MG tablet Take 1 tablet (10 mg total) by mouth daily. (Patient not taking: Reported on 10/29/2022) 30 tablet 0   finasteride (PROSCAR) 5 MG tablet Take 1 tablet (5 mg total) by mouth daily. (Patient not taking: Reported on 09/24/2022) 30 tablet 0   lidocaine (LIDODERM) 5 % Place 2 patches onto the skin daily. Remove & Discard patch within 12 hours or as directed by MD (Patient not taking: Reported on 10/29/2022) 30 patch 0   nicotine (NICODERM CQ - DOSED IN MG/24 HR) 7 mg/24hr patch 7 mg patch daily x2 weeks and stop (Patient not taking: Reported on 10/29/2022) 28 patch 0   No facility-administered medications prior to visit.     ROS Review of Systems  Constitutional:  Negative for activity change and appetite change.  HENT:  Negative for sinus pressure and sore throat.   Respiratory:  Negative for chest tightness, shortness of breath and wheezing.   Cardiovascular:  Negative for chest pain and palpitations.  Gastrointestinal:  Negative for abdominal distention, abdominal pain and constipation.  Genitourinary: Negative.   Musculoskeletal: Negative.   Psychiatric/Behavioral:  Negative for behavioral problems and dysphoric mood.     Objective:  BP (!) 190/72   Pulse (!) 104   Ht '6\' 1"'$  (1.854  m)   SpO2 99%   BMI 23.75 kg/m      10/29/2022    9:54 AM 10/06/2022   11:40 AM 10/06/2022   10:57 AM  BP/Weight  Systolic BP 295 621 308  Diastolic BP 72 84 88      Physical Exam Constitutional:      Appearance: He is well-developed.  Cardiovascular:     Rate and Rhythm: Tachycardia present.     Heart sounds: Normal heart sounds. No murmur heard. Pulmonary:     Effort: Pulmonary effort is normal.     Breath sounds: Normal breath sounds. No wheezing or rales.  Chest:     Chest wall: No tenderness.  Abdominal:     General: Bowel sounds are normal. There is no distension.     Palpations: Abdomen is soft. There is no mass.     Tenderness: There is no abdominal tenderness.  Musculoskeletal:        General: Normal range of motion.     Right lower leg: No edema.     Comments: Left BKA  Neurological:     Mental Status: He is alert and oriented to person, place, and time.  Psychiatric:        Mood and Affect: Mood normal.    Diabetic Foot Exam - Simple   Simple Foot Form Visual Inspection See comments: Yes Sensation Testing See comments: Yes Pulse Check  See comments: Yes Comments Left BKA Right foot: Hammertoes, normal monofilament testing, normal posterior tibialis and dorsalis pedis No callus or ulcerations        Latest Ref Rng & Units 09/26/2022    4:18 AM 09/25/2022    3:48 AM 08/27/2022   10:13 AM  CMP  Glucose 70 - 99 mg/dL 119  172  107   BUN 8 - 23 mg/dL '21  15  11   '$ Creatinine 0.61 - 1.24 mg/dL 1.08  1.07  0.96   Sodium 135 - 145 mmol/L 134  134  138   Potassium 3.5 - 5.1 mmol/L 3.6  4.3  4.2   Chloride 98 - 111 mmol/L 99  99  101   CO2 22 - 32 mmol/L '26  25  25   '$ Calcium 8.9 - 10.3 mg/dL 8.3  8.2  8.5   Total Protein 6.0 - 8.5 g/dL   6.8   Total Bilirubin 0.0 - 1.2 mg/dL   0.4   Alkaline Phos 44 - 121 IU/L   128   AST 0 - 40 IU/L   12   ALT 0 - 44 IU/L   7     Lipid Panel     Component Value Date/Time   CHOL 133 08/27/2022 1013   TRIG  90 08/27/2022 1013   HDL 50 08/27/2022 1013   CHOLHDL 2.7 08/27/2022 1013   CHOLHDL 4.9 06/05/2022 0355   VLDL 24 06/05/2022 0355   LDLCALC 66 08/27/2022 1013    CBC    Component Value Date/Time   WBC 5.3 09/26/2022 0418   RBC 2.94 (L) 09/26/2022 0418   HGB 7.8 (L) 09/26/2022 0418   HGB 8.9 (L) 08/27/2022 1013   HCT 23.6 (L) 09/26/2022 0418   HCT 29.1 (L) 08/27/2022 1013   PLT 121 (L) 09/26/2022 0418   PLT 135 (L) 08/27/2022 1013   MCV 80.3 09/26/2022 0418   MCV 87 08/27/2022 1013   MCH 26.5 09/26/2022 0418   MCHC 33.1 09/26/2022 0418   RDW 14.8 09/26/2022 0418   RDW 16.2 (H) 08/27/2022 1013   LYMPHSABS 1.0 08/04/2022 0508   MONOABS 0.4 08/04/2022 0508   EOSABS 0.1 08/04/2022 0508   BASOSABS 0.0 08/04/2022 0508    Lab Results  Component Value Date   HGBA1C 6.4 10/29/2022    Assessment & Plan:  1. Type 2 diabetes mellitus with left diabetic foot infection ([place]) Controlled with A1c of 6.4 Continue current regimen Counseled on Diabetic diet, my plate method, 660 minutes of moderate intensity exercise/week Blood sugar logs with fasting goals of 80-120 mg/dl, random of less than 180 and in the event of sugars less than 60 mg/dl or greater than 400 mg/dl encouraged to notify the clinic. Advised on the need for annual eye exams, annual foot exams, Pneumonia vaccine. - POCT glycosylated hemoglobin (Hb A1C) - Microalbumin/Creatinine Ratio, Urine  2. S/P BKA (below knee amputation) unilateral, left (HCC) Currently wearing a stump shrinker Follow-up with orthopedic  3. Ureteral cancer, left [facility]) Status post transurethral resection of bladder tumor in 2022  4. PAF (paroxysmal atrial fibrillation) (HCC) Currently in sinus rhythm Continue anticoagulation with Eliquis, rate control with beta-blocker - apixaban (ELIQUIS) 5 MG TABS tablet; Take 1 tablet (5 mg total) by mouth 2 (two) times daily.  Dispense: 60 tablet; Refill: 1  5. Hypertension associated with diabetes  ([place]) Uncontrolled due to the fact that he is yet to take his antihypertensives No regimen change in medication adherence has been strongly  encouraged I will reassess his blood pressure at his next visit Counseled on blood pressure goal of less than 130/80, low-sodium, DASH diet, medication compliance, 150 minutes of moderate intensity exercise per week. Discussed medication compliance, adverse effects.   6. Chronic pain syndrome Currently on fentanyl patch and oxycodone from pain management He does not have a prescription for Narcan and I will write this for him Discussed side effects of opioid analgesics and the need for caution - naloxone (NARCAN) nasal spray 4 mg/0.1 mL; Place 1 spray into the nose once for 1 dose.  Dispense: 1 each; Refill: 1  7. Screening for colon cancer - Ambulatory referral to Gastroenterology  8. Tobacco abuse Smoking cessation support: smoking cessation hotline: 1-800-QUIT-NOW.  Smoking cessation classes are available through [facility]. Call [phone] or visit our website at [email or web address]  Spent 3 minutes counseling on dangers of tobacco use and benefits of quitting, offered pharmacological intervention to aid quitting and patient is ready to quit.  - nicotine (NICODERM CQ - DOSED IN MG/24 HR) 7 mg/24hr patch; 7 mg patch daily x2 weeks and stop  Dispense: 28 patch; Refill: 1  9. CAD s/p continuous angioplasty Status post PCI to proximal LAD in 05/2022 He remains on Plavix Advised to discuss with his cardiologist possible duration of Plavix which should be completed in 05/2023 given that over 12 months post PCI as he is also currently on Eliquis  Meds ordered this encounter  Medications   nicotine (NICODERM CQ - DOSED IN MG/24 HR) 7 mg/24hr patch    Sig: 7 mg patch daily x2 weeks and stop    Dispense:  28 patch    Refill:  1   naloxone (NARCAN) nasal spray 4 mg/0.1 mL    Sig: Place 1 spray into the nose once for 1  dose.    Dispense:  1 each    Refill:  1   DISCONTD: apixaban (ELIQUIS) 5 MG TABS tablet    Sig: Take 1 tablet (5 mg total) by mouth 2 (two) times daily.    Dispense:  60 tablet    Refill:  1   apixaban (ELIQUIS) 5 MG TABS tablet    Sig: Take 1 tablet (5 mg total) by mouth 2 (two) times daily.    Dispense:  60 tablet    Refill:  1    Follow-up: Return in about 1 month (around 11/29/2022) for Blood Pressure follow-up.       Charlott Rakes, MD, FAAFP. [facility] [place], [place]   10/29/2022, 10:30 AM

## 2022-11-04 ENCOUNTER — Encounter: Payer: Medicaid Other | Admitting: Physical Medicine and Rehabilitation

## 2022-11-05 IMAGING — DX DG FOOT COMPLETE 3+V*L*
3 series · 3 of 3 positions shown · non-contrast
Comparison: 08/19/2021.

CLINICAL DATA: Gangrene left foot.

EXAM:
LEFT FOOT - COMPLETE 3+ VIEW

[foot ap]
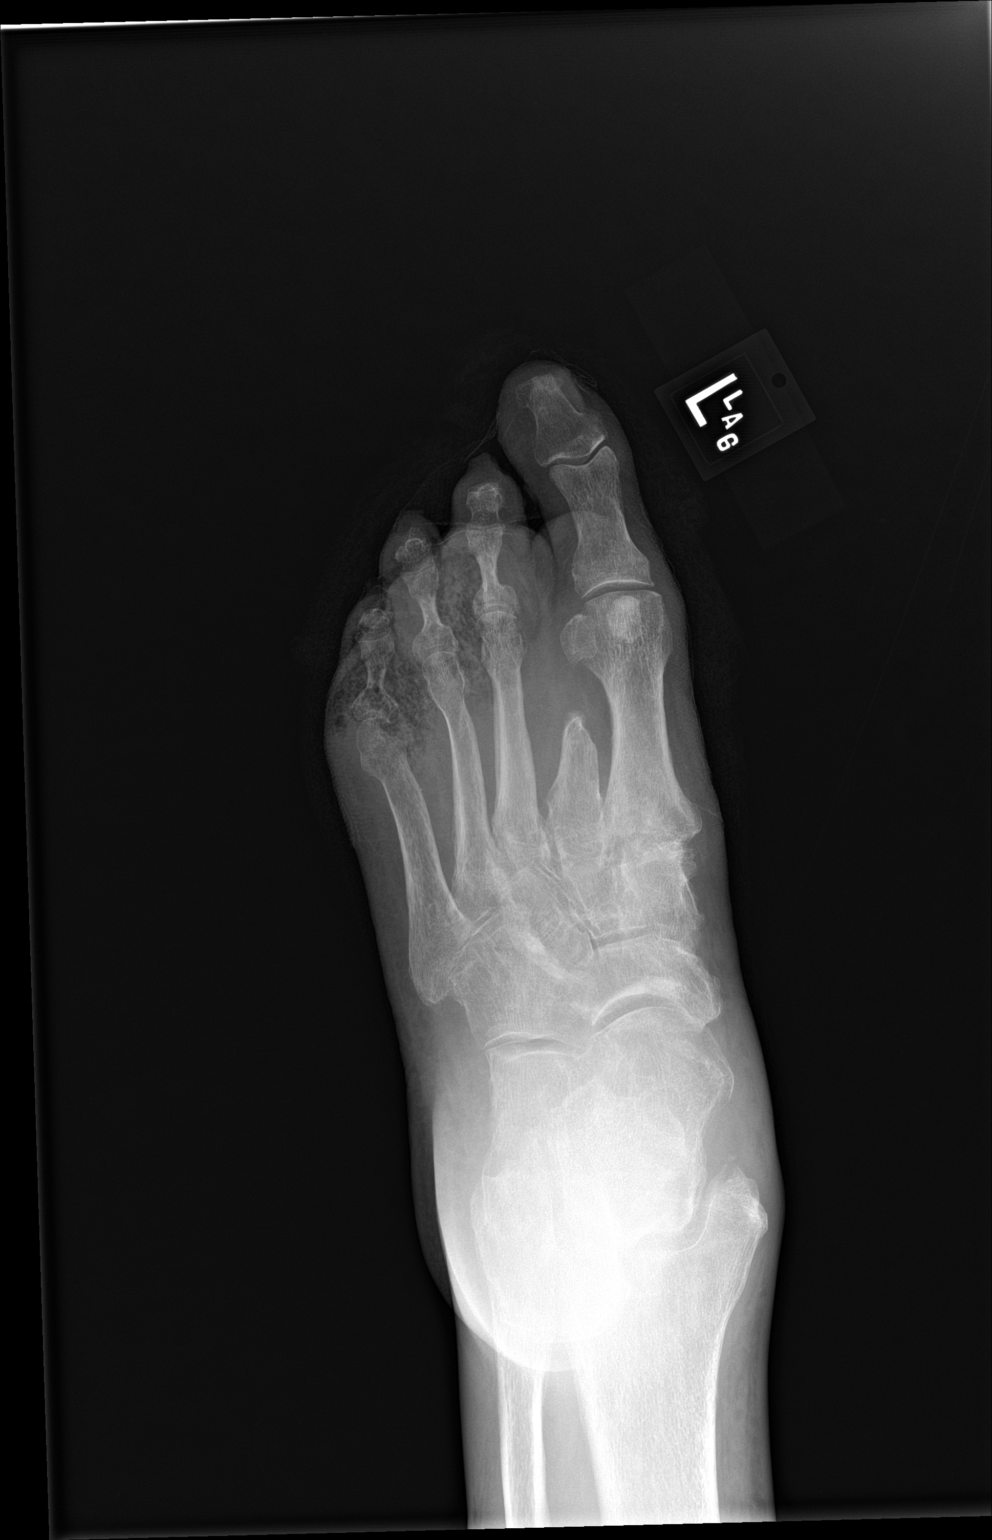

[foot obl]
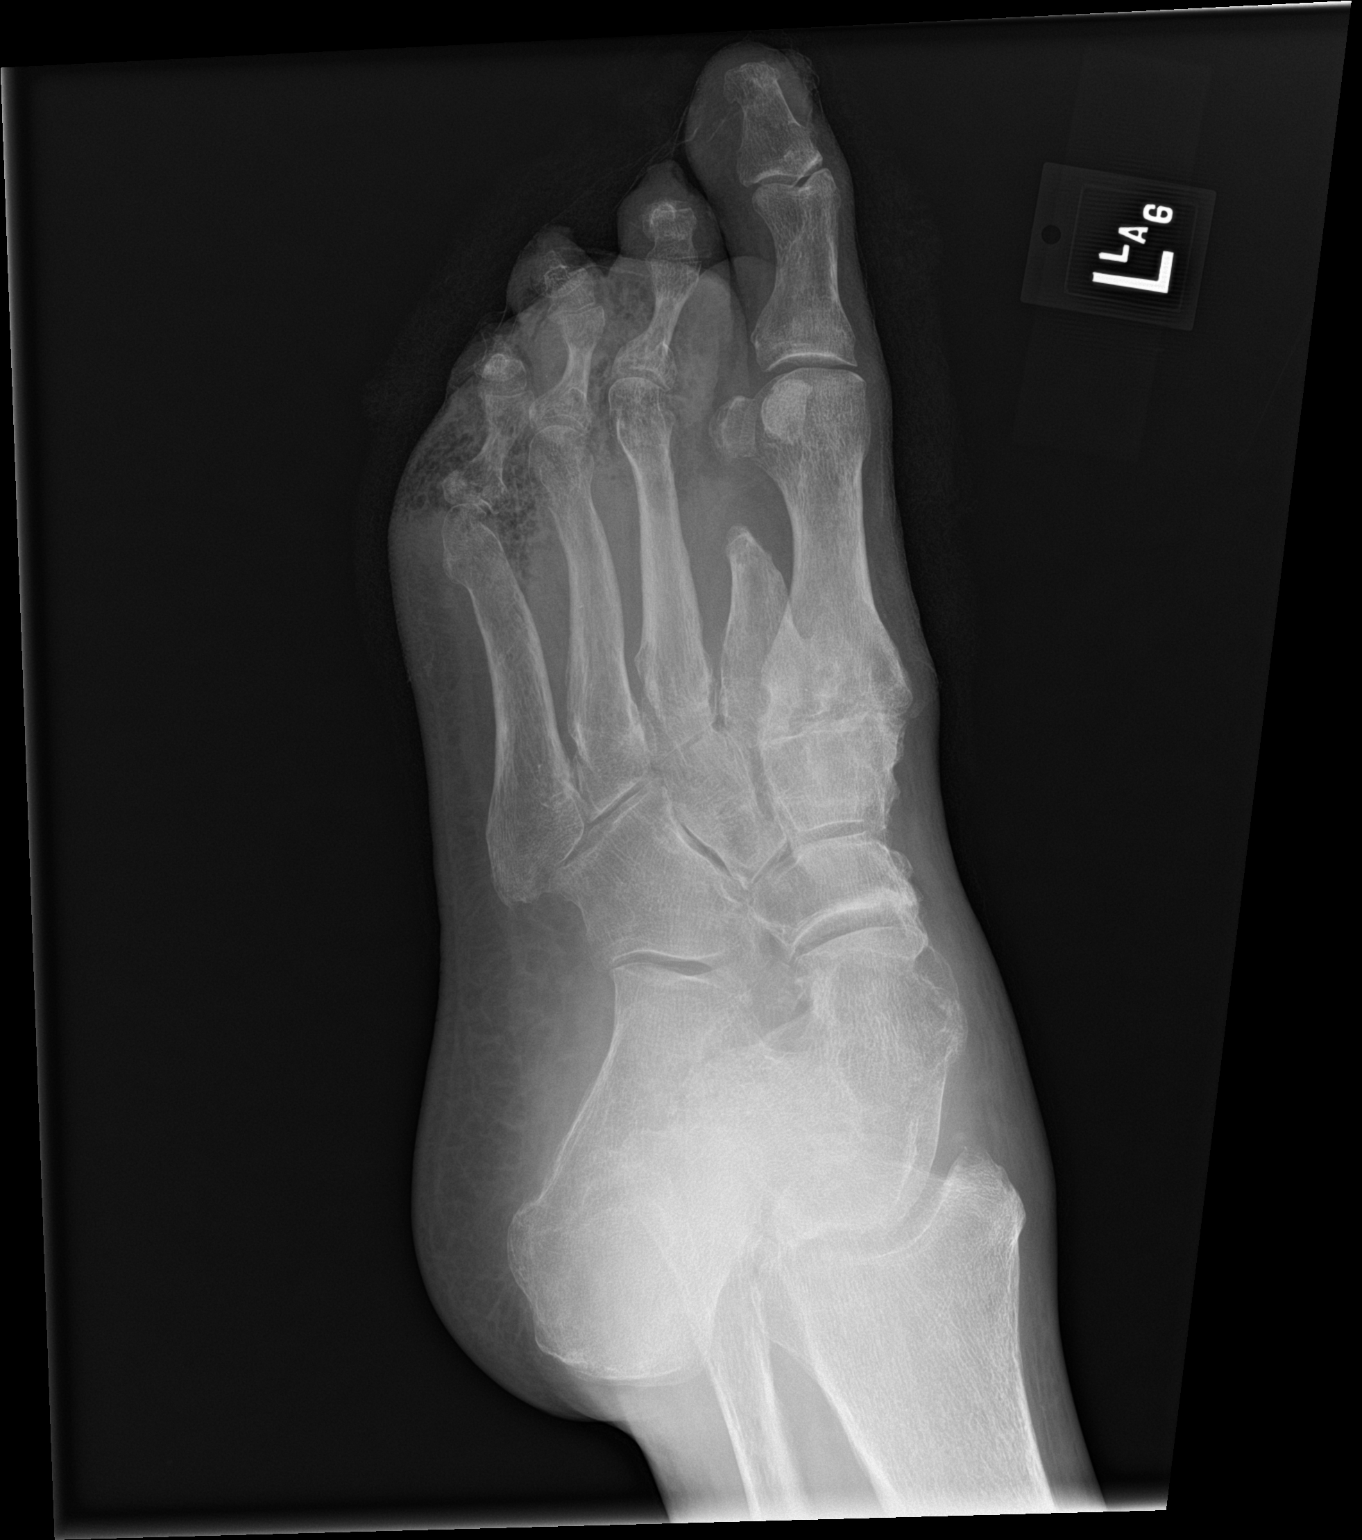

[foot lat]
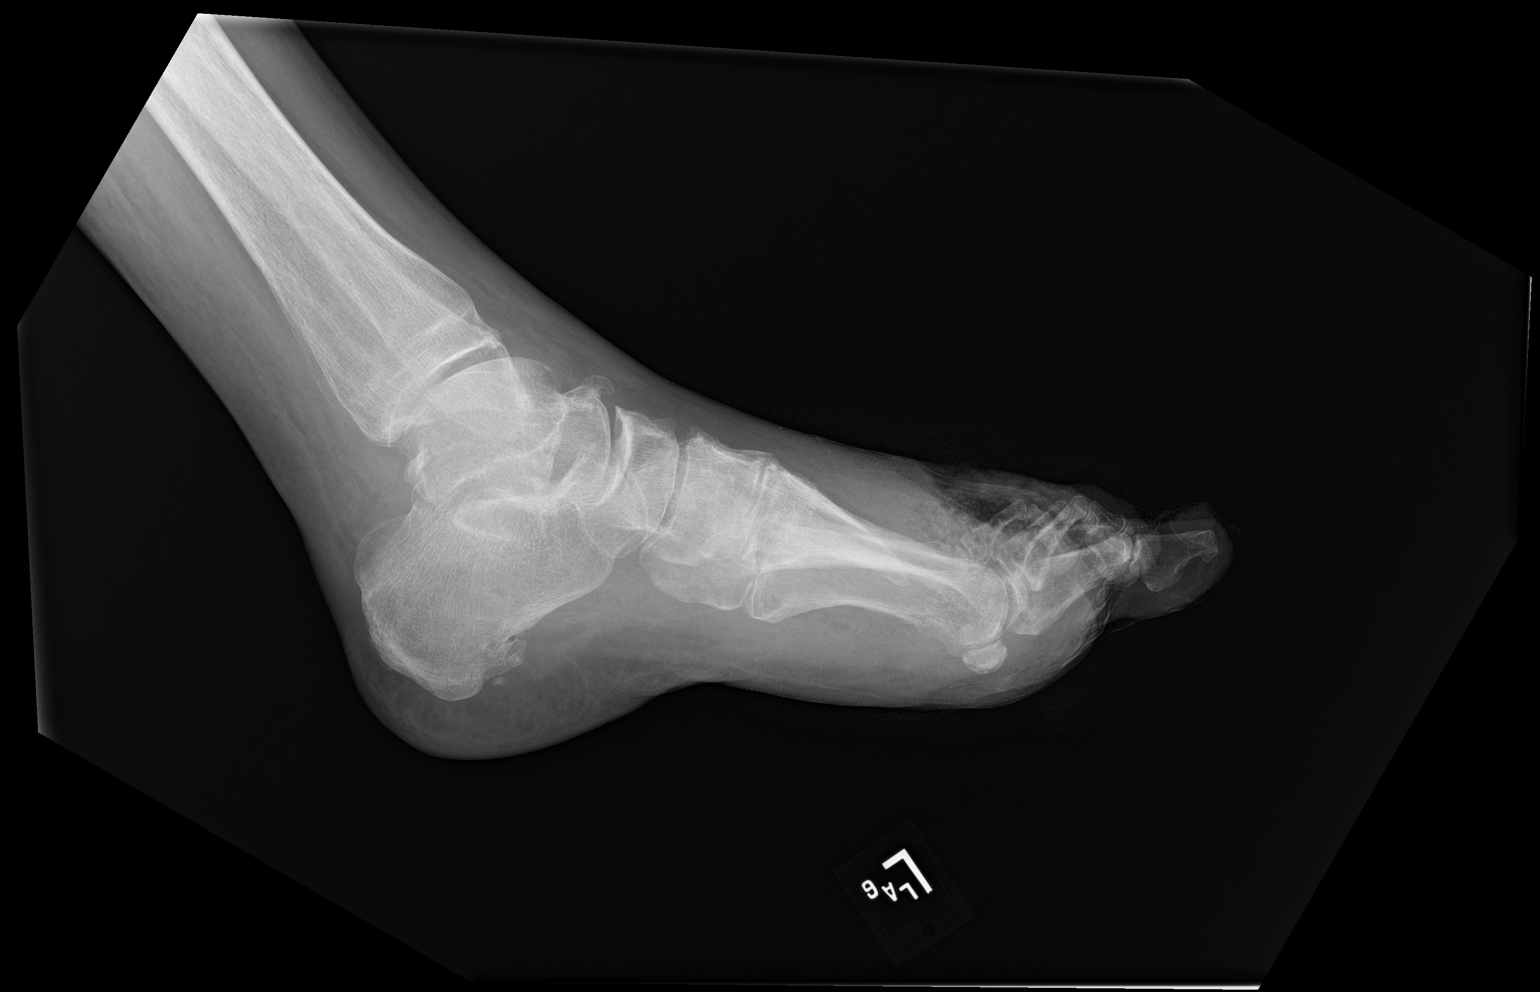

[3 of 3 positions shown; findings below may reference images not displayed]

FINDINGS: No acute fracture or dislocation. There has been amputation of the
second digit and mid to distal second metatarsal. Degenerative
changes are noted at the first metatarsophalangeal joint and
midfoot. There is soft tissue swelling at the forefoot with
subcutaneous air about the third, fourth, and fifth digits and
distal metatarsals. Lucencies are noted in the base of the proximal
phalanx of the first digit.
IMPRESSION: 1. Soft tissue swelling at the forefoot with subcutaneous air
involving the third, fourth, and fifth digits and distal
metatarsals, concerning for necrotizing fasciitis.
2. Lucencies in the base of the proximal phalanx of the fifth digit,
possible osteomyelitis versus artifact overlying gas in the soft
tissues.

## 2022-11-05 IMAGING — MR MR FOOT*L* W/O CM
6 series · 40 of 40 positions shown · non-contrast
Comparison: X-ray 03/25/2022

CLINICAL DATA: Bone mass or bone pain, foot, aggressive features on
xray

EXAM:
MRI OF THE LEFT FOOT WITHOUT CONTRAST
TECHNIQUE: Multiplanar, multisequence MR imaging of the left forefoot was
performed. No intravenous contrast was administered.

[Series 4: STIR · sagittal · left · 3.0mm · 0.35mm/px · 6 of 30 slices shown]
[im 1/30]
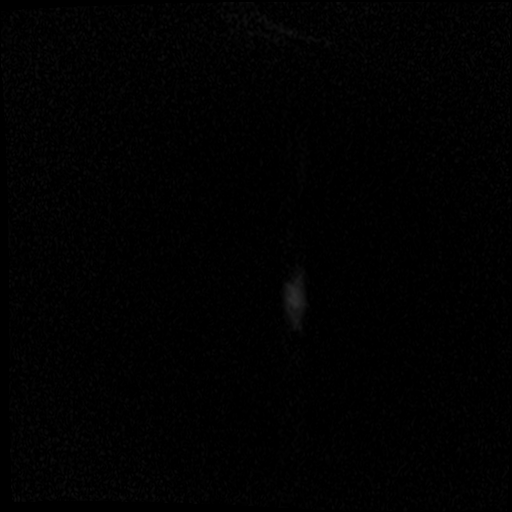
[im 6/30]
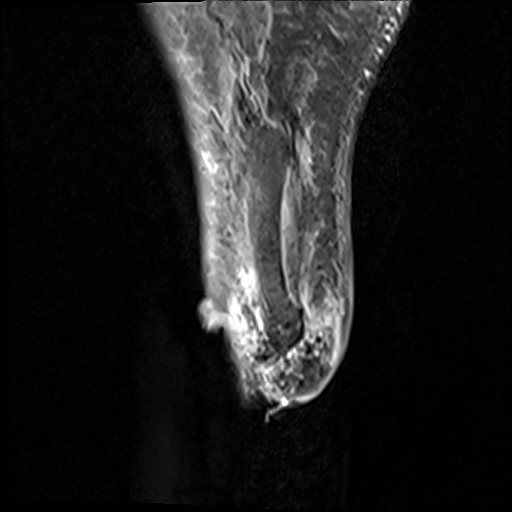
[im 12/30]
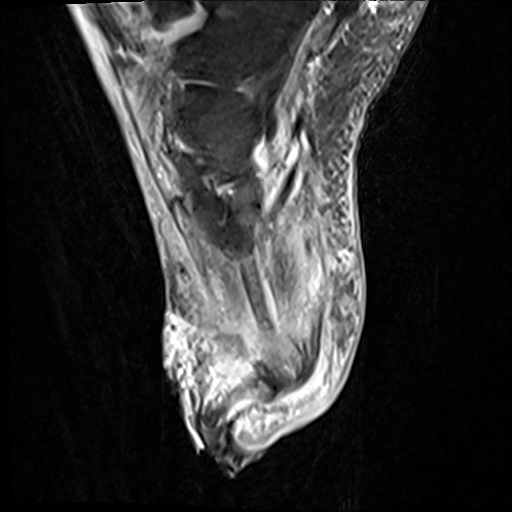
[im 18/30]
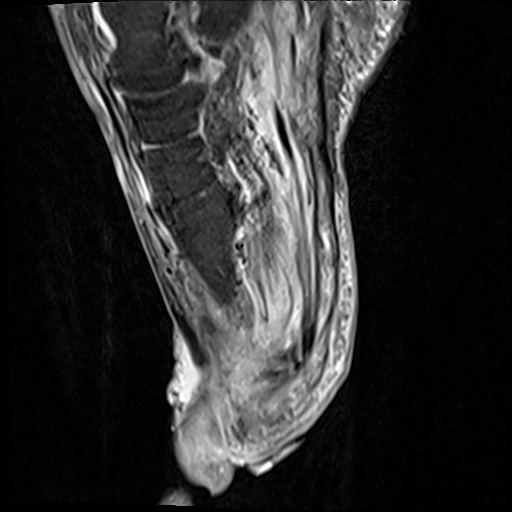
[im 24/30]
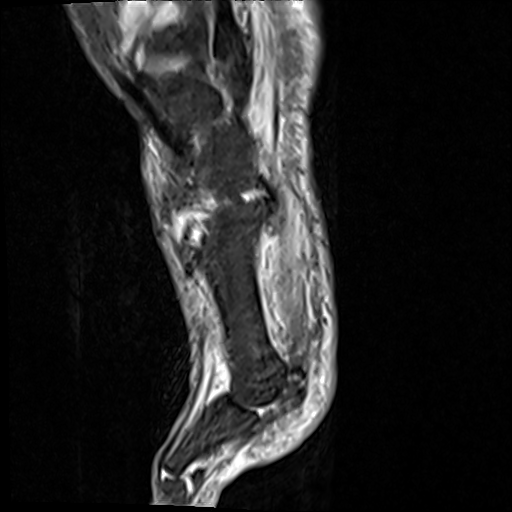
[im 30/30]
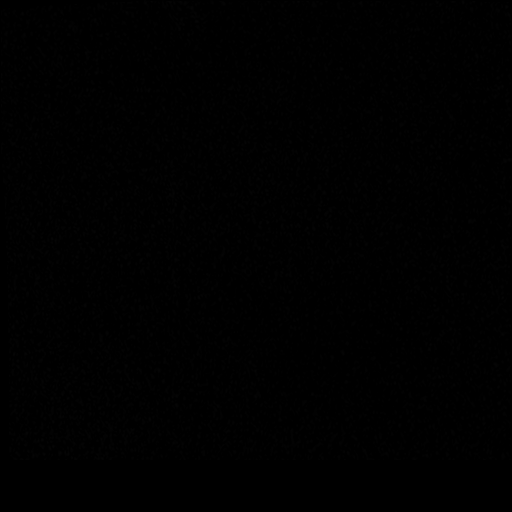

[Series 5: T1 · oblique · left · 3.0mm · 0.70mm/px · 6 of 28 slices shown (1 of 3)]
[im 1/28]
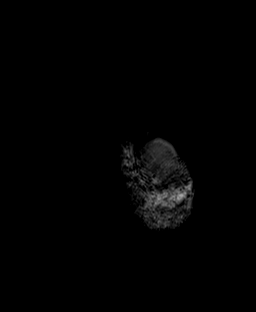
[im 6/28]
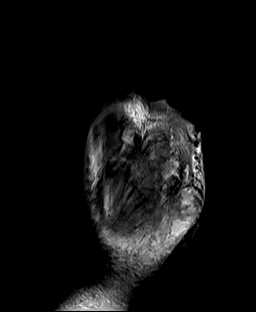
[im 11/28]
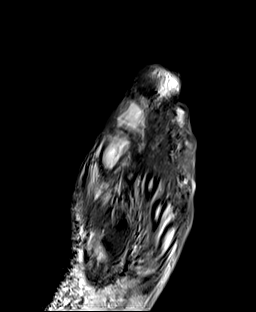
[im 17/28]
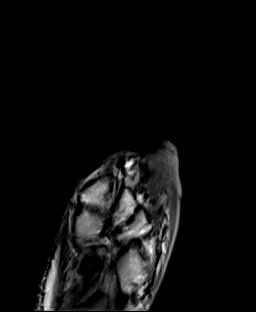
[im 22/28]
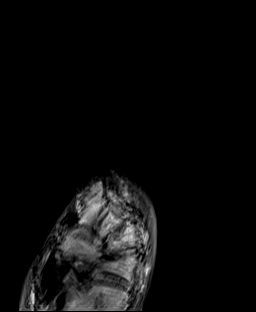
[im 28/28]
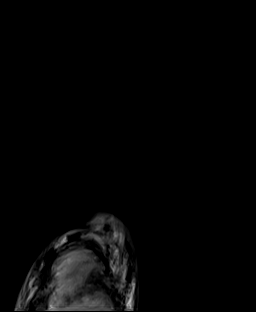

[Series 6: T2 fat-sat · oblique · left · 3.0mm · 0.70mm/px · 6 of 31 slices shown (1 of 2)]
[im 1/31]
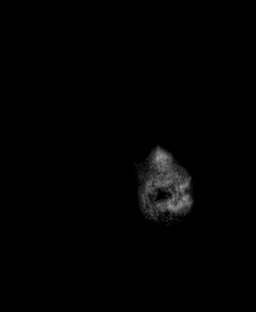
[im 7/31]
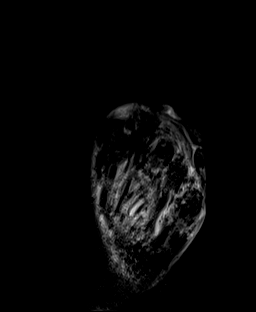
[im 13/31]
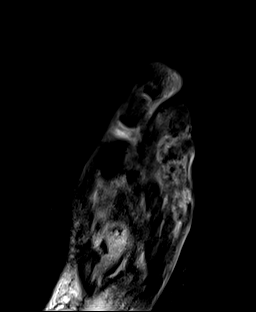
[im 19/31]
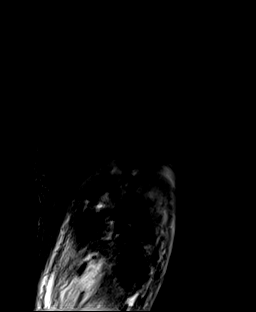
[im 25/31]
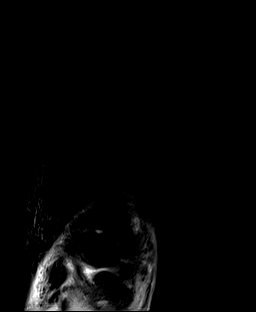
[im 31/31]
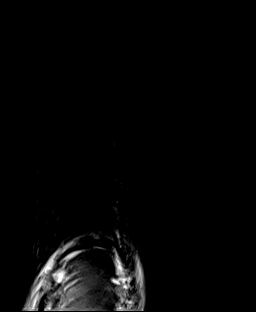

[Series 7: T1 · oblique · left · 3.0mm · 0.55mm/px · 8 of 39 slices shown (2 of 3)]
[im 1/39]
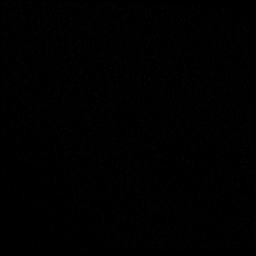
[im 6/39]
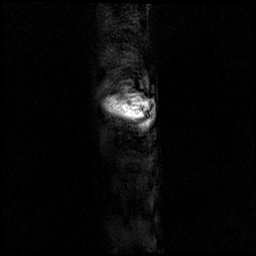
[im 11/39]
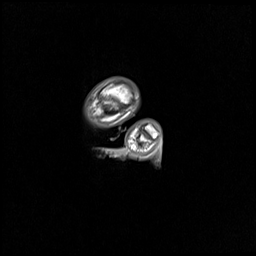
[im 17/39]
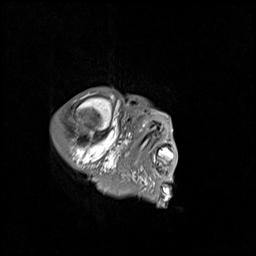
[im 22/39]
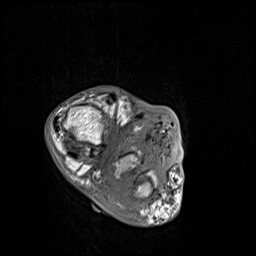
[im 28/39]
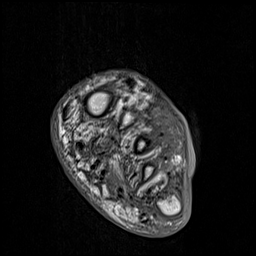
[im 33/39]
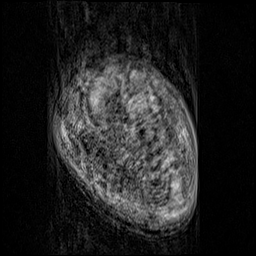
[im 39/39]
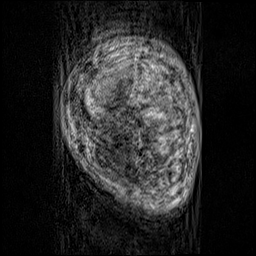

[Series 8: T1 · oblique · left · 3.0mm · 0.70mm/px · 6 of 28 slices shown (3 of 3)]
[im 1/28]
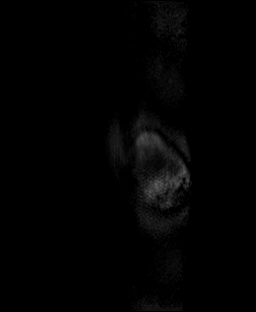
[im 6/28]
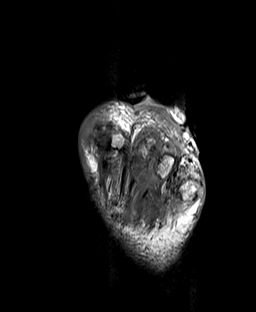
[im 11/28]
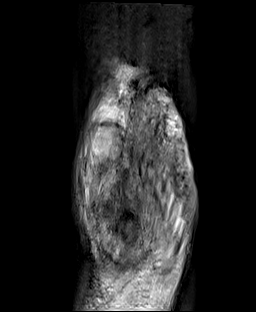
[im 17/28]
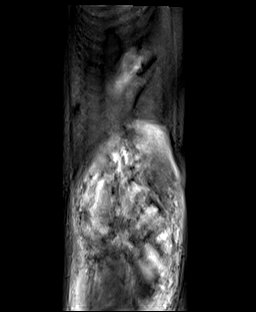
[im 22/28]
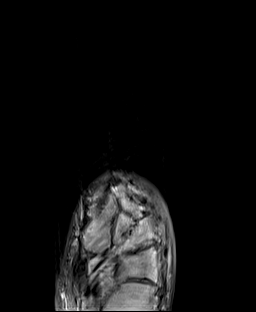
[im 28/28]
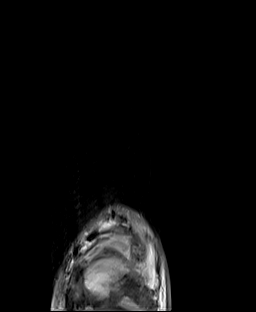

[Series 9: T2 fat-sat · oblique · left · 3.0mm · 0.38mm/px · 8 of 39 slices shown (2 of 2)]
[im 1/39]
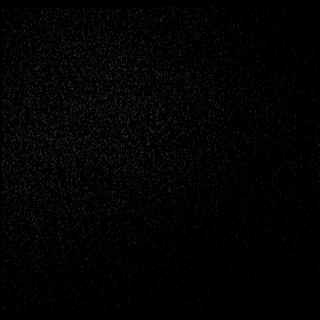
[im 6/39]
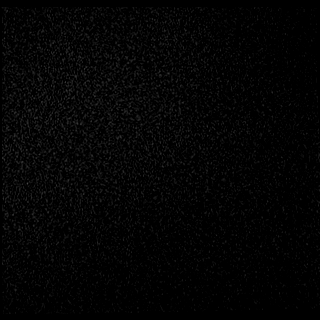
[im 11/39]
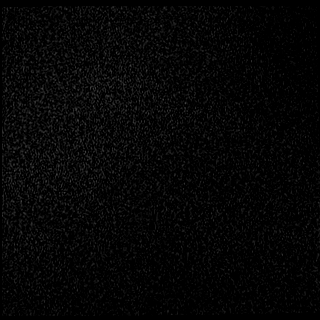
[im 17/39]
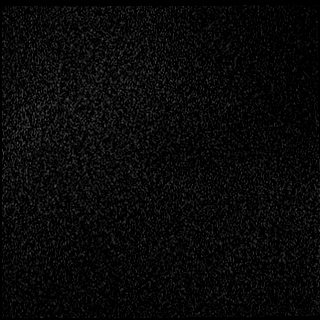
[im 22/39]
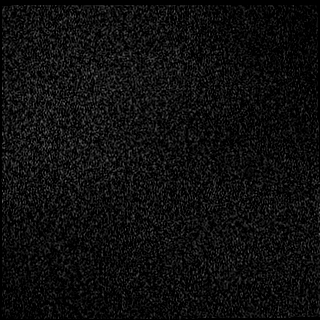
[im 28/39]
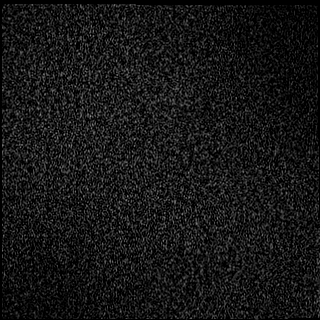
[im 33/39]
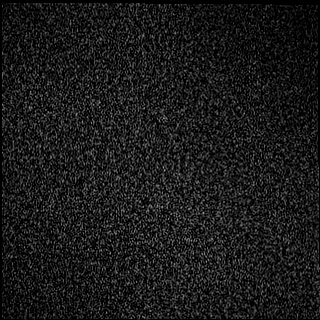
[im 39/39]
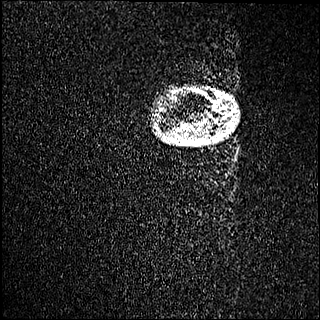

[40 of 40 positions shown; findings below may reference images not displayed]

FINDINGS: Severely motion degraded, essentially nondiagnostic exam.

Partial second ray amputation. Decreased T1 marrow signal within the
third toe and distal third metatarsal, and to a lesser degree within
the distal fourth and fifth rays, evaluation markedly limited.

Probable flexor tenosynovitis within the forefoot. Soft tissue
swelling with wound or ulceration at the distal forefoot laterally.
Numerous foci of susceptibility compatible with soft tissue gas as
seen radiographically.
IMPRESSION: 1. Severely motion degraded, essentially non-diagnostic exam. See
above.
2. Osteomyelitis of the distal third, fourth, and fifth rays is not
excluded.
3. Probable flexor tenosynovitis within the forefoot.
4. Soft tissue swelling with wound or ulceration at the distal
forefoot laterally. Numerous foci of susceptibility compatible with
soft tissue gas as seen radiographically.

## 2022-11-10 ENCOUNTER — Encounter: Payer: Self-pay | Admitting: Physical Medicine and Rehabilitation

## 2022-11-10 ENCOUNTER — Ambulatory Visit: Payer: Commercial Managed Care - HMO | Admitting: Orthopedic Surgery

## 2022-11-10 ENCOUNTER — Encounter: Payer: Medicaid Other | Attending: Registered Nurse | Admitting: Physical Medicine and Rehabilitation

## 2022-11-10 VITALS — BP 180/97 | HR 82 | Ht 73.0 in

## 2022-11-10 DIAGNOSIS — Z89512 Acquired absence of left leg below knee: Secondary | ICD-10-CM | POA: Diagnosis not present

## 2022-11-10 DIAGNOSIS — G546 Phantom limb syndrome with pain: Secondary | ICD-10-CM | POA: Diagnosis not present

## 2022-11-10 DIAGNOSIS — G8918 Other acute postprocedural pain: Secondary | ICD-10-CM

## 2022-11-10 DIAGNOSIS — G894 Chronic pain syndrome: Secondary | ICD-10-CM | POA: Insufficient documentation

## 2022-11-10 DIAGNOSIS — I10 Essential (primary) hypertension: Secondary | ICD-10-CM | POA: Diagnosis present

## 2022-11-10 MED ORDER — OXYCODONE HCL 5 MG PO TABS: 5.0000 mg | ORAL_TABLET | Freq: Two times a day (BID) | ORAL | 0 refills | Status: DC | PRN

## 2022-11-10 MED ORDER — GABAPENTIN 300 MG PO CAPS: 300.0000 mg | ORAL_CAPSULE | Freq: Three times a day (TID) | ORAL | 3 refills | Status: DC

## 2022-11-10 MED ORDER — METOPROLOL SUCCINATE ER 50 MG PO TB24: 50.0000 mg | ORAL_TABLET | Freq: Every day | ORAL | 3 refills | Status: DC

## 2022-11-10 MED ORDER — FENTANYL 75 MCG/HR TD PT72: 1.0000 | MEDICATED_PATCH | TRANSDERMAL | 0 refills | Status: DC

## 2022-11-10 NOTE — Patient Instructions (Signed)
HTN: -BP is 180/97  today.  -Advised checking BP daily at home and logging results to bring into follow-up appointment with PCP and myself. -Reviewed BP meds today.  -Advised regarding healthy foods that can help lower blood pressure and provided with a list: 1) citrus foods- high in vitamins and minerals 2) salmon and other fatty fish - reduces inflammation and oxylipins 3) swiss chard (leafy green)- high level of nitrates 4) pumpkin seeds- one of the best natural sources of magnesium 5) Beans and lentils- high in fiber, magnesium, and potassium 6) Berries- high in flavonoids 7) Amaranth (whole grain, can be cooked similarly to rice and oats)- high in magnesium and fiber 8) Pistachios- even more effective at reducing BP than other nuts 9) Carrots- high in phenolic compounds that relax blood vessels and reduce inflammation 10) Celery- contain phthalides that relax tissues of arterial walls 11) Tomatoes- can also improve cholesterol and reduce risk of heart disease 12) Broccoli- good source of magnesium, calcium, and potassium 13) Greek yogurt: high in potassium and calcium 14) Herbs and spices: Celery seed, cilantro, saffron, lemongrass, black cumin, ginseng, cinnamon, cardamom, sweet basil, and ginger 15) Chia and flax seeds- also help to lower cholesterol and blood sugar 16) Beets- high levels of nitrates that relax blood vessels  17) spinach and bananas- high in potassium  -Provided lise of supplements that can help with hypertension:  1) magnesium: one high quality brand is [employer] since it contains all 7 types of magnesium, otherwise over the counter magnesium gluconate '400mg'$  is a good option 2) B vitamins 3) vitamin D 4) potassium 5) CoQ10 6) L-arginine 7) Vitamin C 8) Beetroot -Educated that goal BP is 120/80. -Made goal to incorporate some of the above foods into diet.

## 2022-11-10 NOTE — Progress Notes (Signed)
Subjective:    Patient ID: Robert Burnett, male    DOB: 10-Aug-1961, 62 y.o.   MRN: [id number]  HPI:   1) Chronic pain:  Robert Burnett is a 62 y.o. male who returns for follow-up appointment for chronic pain and medication refill. He states his pain is located in his ribs and phantom pain. He rates his pain 7. His current exercise regime is walking with walker to bathroom only, otherwise wheelchair bound.   He was admitted to [facility] on 09/24/2022 for Left Dehiscense transtibial amputation and discharged on 09/26/2022. Discharge Summary was reviewed.  On 09/24/2022 he underwent: By Dr. Sharol Given REVISION LEFT BELOW KNEE AMPUTATION Left Choice    Ms. Cobern Morphine equivalent is 210.00 MME.   Last oral swab was Performed on 09/05/2022, it was consistent.  -hurts every day -the fentanyl patches greatly helped -amitriptyline caused him to be dizzy and have a fall -he would cry and moan due to his pain after starting the fentanyl patches -for years he could not sleep on his back because he felt he had a giant boulder on his chest bone.   2) s/p BKA -required revision due to small hole -has had f/u with Dr. Sharol Given -he has phantom limb pain -he sometimes getting jabbing pains -he is not sure if gabapentin helps      Pain Inventory Average Pain 6 Pain Right Now 7 My pain is constant, sharp, burning, dull, stabbing, tingling, and aching  LOCATION OF PAIN   back, torso  BOWEL Number of stools per week: 3-r Oral laxative use Yes  Type of laxative OTC   BLADDER Normal   Mobility walk without assistance use a walker ability to climb steps?  no do you drive?  no Do you have any goals in this area?  yes  Function I need assistance with the following:  dressing, meal prep, household duties, and shopping Do you have any goals in this area?  yes  Neuro/Psych weakness numbness trouble walking spasms  Prior Studies Any changes since last visit?  no  Physicians  involved in your care Any changes since last visit?  no   Family History  Problem Relation Age of Onset   Cancer Mother    Hypertension Father    Diabetes Father    Lung cancer Father    Diabetes Sister    Cancer Sister    Social History   Socioeconomic History   Marital status: Single    Spouse name: Not on file   Number of children: Not on file   Years of education: Not on file   Highest education level: Not on file  Occupational History   Not on file  Tobacco Use   Smoking status: Every Day    Types: Pipe   Smokeless tobacco: Former    Types: Chew   Tobacco comments:    Using pipe until about a week ago (05/2022)    Quit cigarettes 2003ish  Vaping Use   Vaping Use: Never used  Substance and Sexual Activity   Alcohol use: No   Drug use: No   Sexual activity: Not on file  Other Topics Concern   Not on file  Social History Narrative   Lives in [place] by himself.  Sedentary in the setting of chronic arthritic pain impacting his chest/back along w/ L foot amputation.   Social Determinants of Health   Financial Resource Strain: Not on file  Food Insecurity: No Food Insecurity (09/24/2022)   Hunger  Vital Sign    Worried About Charity fundraiser in the Last Year: Never true    Ran Out of Food in the Last Year: Never true  Transportation Needs: No Transportation Needs (09/24/2022)   PRAPARE - Hydrologist (Medical): No    Lack of Transportation (Non-Medical): No  Physical Activity: Not on file  Stress: Not on file  Social Connections: Not on file   Past Surgical History:  Procedure Laterality Date   ACHILLES TENDON SURGERY Left 03/28/2022   Procedure: ACHILLES LENGTHENING/KIDNER;  Surgeon: [name], DPM;  Location: [facility];  Service: Podiatry;  Laterality: Left;   AMPUTATION Left 03/25/2022   Procedure: AMPUTATION DIGITS,TRANSMETARSAL AMPUTATION, REMOVAL OF TOES AND BONE BEHIND TOES ;  Surgeon: [name], DPM;  Location: [facility];  Service: Podiatry;  Laterality: Left;   AMPUTATION Left 07/23/2022   Procedure: LEFT BELOW KNEE AMPUTATION;  Surgeon: [name], MD;  Location: [place];  Service: Orthopedics;  Laterality: Left;   APPLICATION OF WOUND VAC Left 03/25/2022   Procedure: APPLICATION OF WOUND VAC;  Surgeon: [name], DPM;  Location: [facility];  Service: Podiatry;  Laterality: Left;   CORONARY STENT INTERVENTION N/A 06/05/2022   Procedure: CORONARY STENT INTERVENTION;  Surgeon: Belva Crome, MD;  Location: [facility];  Service: Cardiovascular;  Laterality: N/A;   CYSTOSCOPY/RETROGRADE/URETEROSCOPY Left 10/29/2020   Procedure: CYSTOSCOPY/RETROGRADE/ LEFT URETEROSCOPY WITH BIOPSY, LEFT URETERAL STENT;  Surgeon: Raynelle Bring, MD;  Location: [facility];  Service: Urology;  Laterality: Left;   IRRIGATION AND DEBRIDEMENT FOOT Left 03/28/2022   Procedure: AMPUTATION LISFRANC, SECONDARY WOUND CLOSURE;  Surgeon: [name], DPM;  Location: [facility];  Service: Podiatry;  Laterality: Left;   KNEE SURGERY Bilateral 1975 and 1989   LEFT HEART CATH AND CORONARY ANGIOGRAPHY N/A 06/05/2022   Procedure: LEFT HEART CATH AND CORONARY ANGIOGRAPHY;  Surgeon: Belva Crome, MD;  Location: [facility];  Service: Cardiovascular;  Laterality: N/A;   MULTIPLE TOOTH EXTRACTIONS     ROBOT ASSITED LAPAROSCOPIC NEPHROURETERECTOMY Left 12/13/2020   Procedure: XI ROBOT ASSITED LAPAROSCOPIC NEPHROURETERECTOMY , POST OPERATIVE INTRAVESICAL GEMCITABINE;  Surgeon: Raynelle Bring, MD;  Location: [facility];  Service: Urology;  Laterality: Left;  ONLY NEEDS 240 MIN   STUMP REVISION Left 09/24/2022   Procedure: REVISION LEFT BELOW KNEE AMPUTATION;  Surgeon: [name], MD;  Location: [place];  Service: Orthopedics;  Laterality: Left;   TOEM AMPUTATION  Left    2012, second toe   TRANSURETHRAL RESECTION OF BLADDER TUMOR N/A 08/29/2021   Procedure: TRANSURETHRAL RESECTION OF BLADDER TUMOR (TURBT) WITH CYSTOSCOPY;  Surgeon: Raynelle Bring, MD;  Location: [facility];  Service: Urology;  Laterality: N/A;  GENERAL ANESTHESIA WITH PARALYSIS   WISDOM TOOTH EXTRACTION     Past Medical History:  Diagnosis Date   Allergy    Anemia    Anemia    Arthritis    ARTHRITIS IN SPINE - MAKES PT HAVE CHEST PAIN- USES fENTANYL PATCH    Asthma    IN TEENS    Bacteremia    a. 03/2022 Group B Strep bacteremia in setting of diabetic L foot infxn/gangrene/osteo.   CAD (coronary artery disease)    Chronic HFrEF (heart failure with reduced ejection fraction) ([place])    Depression    Diabetes mellitus    Diabetic peripheral neuropathy associated with type 2 diabetes mellitus ([place]) 12/12/2011   Fatty liver disease, nonalcoholic 23/53/6144  Foot osteomyelitis, left ([place])    a. 2012 s/p toe amputations on L; b. 03/2022 - admitted w/ wet gangrene and necrotizing infxn w/ osteomyelitis-->s/p transmetatarsal followed by Lisfranc amputation.   GERD (gastroesophageal reflux disease)    H/O degenerative disc disease    History of blood transfusion    History of echocardiogram    a. 03/2022 Echo: EF 55-60%, no rwma, nl RV fxn, mildly elev PASP. Mild MR.   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([place])    There were no vitals taken for this visit.  Opioid Risk Score:   Fall Risk Score:  `1  Depression screen [facility] 2/9     10/29/2022    9:58 AM 10/06/2022   10:54 AM 08/14/2022   11:30 AM 04/30/2022    9:49 AM 01/06/2019    9:27 AM 05/24/2018   11:35 AM 02/01/2018   11:54 AM  Depression screen PHQ 2/9  Decreased Interest 0 0 0 3 0 0 0  Down, Depressed, Hopeless 0 0 0 1 0 0 0  PHQ - 2 Score 0 0 0 4 0 0 0  Altered sleeping 0  0 0     Tired, decreased energy 0  0 3     Change in appetite 0  0 1     Feeling bad or failure about yourself  0  0 0     Trouble concentrating 0  0 0     Moving slowly or fidgety/restless 0  0 0     Suicidal thoughts 0  0 0     PHQ-9 Score 0  0 8       Review  of Systems  Musculoskeletal:  Positive for back pain and gait problem.  Neurological:  Positive for weakness and numbness.       SPASMS  All other systems reviewed and are negative.      Objective:   Physical Exam Vitals and nursing note reviewed. BP 180/97, weight 180 lbs Constitutional:      Appearance: Normal appearance.  Cardiovascular:     Rate and Rhythm: Normal rate and regular rhythm.     Pulses: Normal pulses.     Heart sounds: Normal heart sounds.  Pulmonary:     Effort: Pulmonary effort is normal.     Breath sounds: Normal breath sounds.  Musculoskeletal:     Cervical back: Normal range of motion and neck supple.     Comments: Normal Muscle Bulk and Muscle Testing Reveals:  Upper Extremities: Full ROM and Muscle Strength 5/5 Lower Extremities: Right: Full ROM and Muscle Strength 5/5 Left BKA: Dressing change: Sutures Intact Arrived in wheelchair  Shrinker in place on left BKA    Skin:    General: Sore on left shin Neurological:     Mental Status: He is alert and oriented to person, place, and time.  Psychiatric:        Mood and Affect: Mood normal.        Behavior: Behavior normal.        Assessment & Plan:  S/P BKA left,: On 09/24/2022 he underwent: By Dr. Sharol Given REVISION LEFT BELOW KNEE AMPUTATION Left Choice  Ortho Following. Continue to Monitor. 10/06/2022  Type 2 DM with long-term insulin use: Continue current medication: PCP Following, Continue to Monitor 10/06/2022 ,Essential Hypertension: Continue current medication regimen. PCP Following. Continue to Monitor. 10/06/2022 -BP is 180/97  today.  -increase Toprol XL to '50mg'$  HS -Advised checking BP daily at home and logging results to bring into follow-up appointment with PCP and myself. -Reviewed BP meds today.  -Advised regarding healthy foods that can help lower blood pressure and provided with a list: 1) citrus foods- high in vitamins and minerals 2) salmon and other fatty fish - reduces  inflammation and oxylipins 3) swiss chard (leafy green)- high level of nitrates 4) pumpkin seeds- one of the best natural sources of magnesium 5) Beans and lentils- high in fiber, magnesium, and potassium 6) Berries- high in flavonoids 7) Amaranth (whole grain, can be cooked similarly to rice and oats)- high in magnesium and fiber 8) Pistachios- even more effective at reducing BP than other nuts 9) Carrots- high in phenolic compounds that relax blood vessels and reduce inflammation 10) Celery- contain phthalides that relax tissues of arterial walls 11) Tomatoes- can also improve cholesterol and reduce risk of heart disease 12) Broccoli- good source of magnesium, calcium, and potassium 13) Greek yogurt: high in potassium and calcium 14) Herbs and spices: Celery seed, cilantro, saffron, lemongrass, black cumin, ginseng, cinnamon, cardamom, sweet basil, and ginger 15) Chia and flax seeds- also help to lower cholesterol and blood sugar 16) Beets- high levels of nitrates that relax blood vessels  17) spinach and bananas- high in potassium  -Provided lise of supplements that can help with hypertension:  1) magnesium: one high quality brand is Bioptemizers since it contains all 7 types of magnesium, otherwise over the counter magnesium gluconate '400mg'$  is a good option 2) B vitamins 3) vitamin D 4) potassium 5) CoQ10 6) L-arginine 7) Vitamin C 8) Beetroot -Educated that goal BP is 120/80. -Made goal to incorporate some of the above foods into diet.      Hyperlipidemia.: Continue current medication regimen. PCP following. Continue to Monitor. 10/06/2022 Chronic Costochondritis: Chest/ Back Pain Chronic Pain Syndrome: Refilled Fentanyl 46mg one patch every three days #10. We will continue the opioid monitoring program, this consists of regular clinic visits, examinations, urine drug screen, pill [place] 12 month [place]  [place]  10/06/2022 Post-Operative Pain : Continue Gabapentin. Continue to monitor. Refilled: Continue slow weaning.  Decrease Oxycodone '5mg'$   one tablet to BID PRN  Phantom Pain: Increase gabapentin '300mg'$  TID. Continue to Monitor.

## 2022-11-24 ENCOUNTER — Ambulatory Visit: Payer: Commercial Managed Care - HMO | Admitting: Orthopedic Surgery

## 2022-11-24 ENCOUNTER — Ambulatory Visit: Payer: Medicaid Other | Attending: Cardiology | Admitting: Cardiology

## 2022-11-24 ENCOUNTER — Encounter: Payer: Self-pay | Admitting: Cardiology

## 2022-11-26 ENCOUNTER — Encounter: Payer: Self-pay | Admitting: Family Medicine

## 2022-11-26 ENCOUNTER — Ambulatory Visit: Payer: Medicaid Other | Attending: Family Medicine | Admitting: Family Medicine

## 2022-11-26 ENCOUNTER — Telehealth: Payer: Self-pay | Admitting: Family Medicine

## 2022-11-26 ENCOUNTER — Other Ambulatory Visit: Payer: Self-pay

## 2022-11-26 VITALS — BP 164/91 | HR 87 | Temp 98.2°F | Ht 73.0 in

## 2022-11-26 DIAGNOSIS — E1159 Type 2 diabetes mellitus with other circulatory complications: Secondary | ICD-10-CM | POA: Diagnosis not present

## 2022-11-26 DIAGNOSIS — M25561 Pain in right knee: Secondary | ICD-10-CM

## 2022-11-26 DIAGNOSIS — G8929 Other chronic pain: Secondary | ICD-10-CM

## 2022-11-26 DIAGNOSIS — I152 Hypertension secondary to endocrine disorders: Secondary | ICD-10-CM

## 2022-11-26 MED ORDER — LOSARTAN POTASSIUM 100 MG PO TABS: 100.0000 mg | ORAL_TABLET | Freq: Every day | ORAL | 1 refills | Status: DC

## 2022-11-26 NOTE — Patient Instructions (Signed)

## 2022-11-26 NOTE — Telephone Encounter (Signed)
He informs me he had to pay $420 for his [place].  Can you please look into this for available options for him or co-pay cards?  Thank you

## 2022-11-26 NOTE — Progress Notes (Signed)
Subjective:  Patient ID: Robert Burnett, male    DOB: [birth year]  Age: 62 y.o. MRN: [id number]  CC: Hypertension   HPI Robert Burnett is a 61 y.o. year old male with a history of type 2 diabetes mellitus (A1c 6.4), diabetic neuropathy, left BKA (in 07/2022 with revision in 09/2022), hypertension, hyperlipidemia, urothelial cancer (status post transurethral resection of bladder tumor in 2022), asthma, GERD, NAFLD, paroxysmal A-fib, CAD (status post DES to proximal LAD in 05/2022), HFrEF   Interval History: Had a visit for chronic disease management 1 month ago at which time his blood pressure was severely elevated and he was yet to take his antihypertensive at that time.  He is here today to follow-up on his blood pressure and his blood pressure is still elevated.  He endorses taking his antihypertensives. Bp at home has been 130/80, 137/83, 133/71 He states he is in pain at the moment and his fentanyl patch is not helping.His right knee hurts and he is unsure if it is from his wheelchair.  Voltaren gel has been ineffective.  He Complains of [employer] costing him $420 and he paid for this so he would not be out of it.   Past Medical History:  Diagnosis Date   Allergy    Anemia    Anemia    Arthritis    ARTHRITIS IN SPINE - MAKES PT HAVE CHEST PAIN- USES fENTANYL PATCH    Asthma    IN TEENS    Bacteremia    a. 03/2022 Group B Strep bacteremia in setting of diabetic L foot infxn/gangrene/osteo.   CAD (coronary artery disease)    Chronic HFrEF (heart failure with reduced ejection fraction) ([place])    Depression    Diabetes mellitus    Diabetic peripheral neuropathy associated with type 2 diabetes mellitus ([place]) 12/12/2011   Fatty liver disease, nonalcoholic 93/81/0175   Foot osteomyelitis, left ([place])    a. 2012 s/p toe amputations on L; b. 03/2022 - admitted w/ wet gangrene and necrotizing infxn w/ osteomyelitis-->s/p transmetatarsal followed by Lisfranc amputation.   GERD  (gastroesophageal reflux disease)    H/O degenerative disc disease    History of blood transfusion    History of echocardiogram    a. 03/2022 Echo: EF 55-60%, no rwma, nl RV fxn, mildly elev PASP. Mild MR.   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[facility])     Past Surgical History:  Procedure Laterality Date   ACHILLES TENDON SURGERY Left 03/28/2022   Procedure: ACHILLES LENGTHENING/KIDNER;  Surgeon: [name], DPM;  Location: [facility];  Service: Podiatry;  Laterality: Left;   AMPUTATION Left 03/25/2022   Procedure: AMPUTATION DIGITS,TRANSMETARSAL AMPUTATION, REMOVAL OF TOES AND BONE BEHIND TOES ;  Surgeon: [name], DPM;  Location: [facility];  Service: Podiatry;  Laterality: Left;   AMPUTATION Left 07/23/2022   Procedure: LEFT BELOW KNEE AMPUTATION;  Surgeon: [name], MD;  Location: [place];  Service: Orthopedics;  Laterality: Left;   APPLICATION OF WOUND VAC Left 03/25/2022   Procedure: APPLICATION OF WOUND VAC;  Surgeon: [name], DPM;  [facility];  Service: Podiatry;  Laterality: Left;   CORONARY STENT INTERVENTION N/A 06/05/2022   Procedure: CORONARY STENT INTERVENTION;  Surgeon: Belva Crome, MD;  Location: [place] [facility];  Service: Cardiovascular;  Laterality: N/A;   CYSTOSCOPY/RETROGRADE/URETEROSCOPY Left 10/29/2020   Procedure:  CYSTOSCOPY/RETROGRADE/ LEFT URETEROSCOPY WITH BIOPSY, LEFT URETERAL STENT;  Surgeon: Raynelle Bring, MD;  [facility];  Service: Urology;  Laterality: Left;   IRRIGATION AND DEBRIDEMENT FOOT Left 03/28/2022   Procedure: AMPUTATION LISFRANC, SECONDARY WOUND CLOSURE;  Surgeon: [name], DPM;  [facility];  Service: Podiatry;  Laterality: Left;   KNEE SURGERY Bilateral 1975 and 1989   LEFT HEART CATH AND CORONARY ANGIOGRAPHY N/A 06/05/2022   Procedure: LEFT HEART CATH AND CORONARY ANGIOGRAPHY;  Surgeon: Belva Crome, MD;  Location: [place] [facility];  Service:  Cardiovascular;  Laterality: N/A;   MULTIPLE TOOTH EXTRACTIONS     ROBOT ASSITED LAPAROSCOPIC NEPHROURETERECTOMY Left 12/13/2020   Procedure: XI ROBOT ASSITED LAPAROSCOPIC NEPHROURETERECTOMY , POST OPERATIVE INTRAVESICAL GEMCITABINE;  Surgeon: Raynelle Bring, MD;  [facility];  Service: Urology;  Laterality: Left;  ONLY NEEDS 240 MIN   STUMP REVISION Left 09/24/2022   Procedure: REVISION LEFT BELOW KNEE AMPUTATION;  Surgeon: [name], MD;  Location: [place];  Service: Orthopedics;  Laterality: Left;   TOEM AMPUTATION  Left    2012, second toe   TRANSURETHRAL RESECTION OF BLADDER TUMOR N/A 08/29/2021   Procedure: TRANSURETHRAL RESECTION OF BLADDER TUMOR (TURBT) WITH CYSTOSCOPY;  Surgeon: Raynelle Bring, MD;  [facility];  Service: Urology;  Laterality: N/A;  GENERAL ANESTHESIA WITH PARALYSIS   WISDOM TOOTH EXTRACTION      Family History  Problem Relation Age of Onset   Cancer Mother    Hypertension Father    Diabetes Father    Lung cancer Father    Diabetes Sister    Cancer Sister     Social History   Socioeconomic History   Marital status: Single    Spouse name: Not on file   Number of children: Not on file   Years of education: Not on file   Highest education level: Not on file  Occupational History   Not on file  Tobacco Use   Smoking status: Every Day    Types: Pipe   Smokeless tobacco: Former    Types: Chew   Tobacco comments:    Using pipe until about a week ago (05/2022)    Quit cigarettes 2003ish  Vaping Use   Vaping Use: Never used  Substance and Sexual Activity   Alcohol use: No   Drug use: No   Sexual activity: Not on file  Robert Topics Concern   Not on file  Social History Narrative   Lives in [place] by himself.  Sedentary in the setting of chronic arthritic pain impacting his chest/back along w/ L foot amputation.   Social Determinants of Health   Financial Resource Strain: Not on file  Food Insecurity: No Food Insecurity (09/24/2022)    Hunger Vital Sign    Worried About Running Out of Food in the Last Year: Never true    Ran Out of Food in the Last Year: Never true  Transportation Needs: No Transportation Needs (09/24/2022)   PRAPARE - Hydrologist (Medical): No    Lack of Transportation (Non-Medical): No  Physical Activity: Not on file  Stress: Not on file  Social Connections: Not on file    Allergies  Allergen Reactions   Amitriptyline Hcl     Confusion   Baclofen    Cymbalta [Duloxetine Hcl]     confusion   Amlodipine Rash   Prozac [Fluoxetine Hcl] Rash    Outpatient Medications Prior to Visit  Medication Sig Dispense Refill  acetaminophen (TYLENOL) 325 MG tablet Take 1-2 tablets (325-650 mg total) by mouth every 4 (four) hours as needed for mild pain.     apixaban (ELIQUIS) 5 MG TABS tablet Take 1 tablet (5 mg total) by mouth 2 (two) times daily. 60 tablet 1   ascorbic acid (VITAMIN C) 1000 MG tablet Take 1 tablet (1,000 mg total) by mouth daily. 30 tablet 0   atorvastatin (LIPITOR) 80 MG tablet Take 1 tablet (80 mg total) by mouth daily. 90 tablet 1   clopidogrel (PLAVIX) 75 MG tablet Take 1 tablet (75 mg total) by mouth daily. 90 tablet 1   dapagliflozin propanediol (FARXIGA) 10 MG TABS tablet Take 1 tablet (10 mg total) by mouth daily before breakfast. 90 tablet 1   diclofenac Sodium (VOLTAREN) 1 % GEL Apply 2 g topically 4 (four) times daily as needed (Left hip pain). 200 g 0   docusate sodium (COLACE) 100 MG capsule Take 1 capsule (100 mg total) by mouth daily. (Patient taking differently: Take 100 mg by mouth daily as needed.) 10 capsule 0   fentaNYL (DURAGESIC) 75 MCG/HR Place 1 patch onto the skin every 3 (three) days. 10 patch 0   gabapentin (NEURONTIN) 300 MG capsule Take 1 capsule (300 mg total) by mouth 3 (three) times daily. 90 capsule 3   insulin NPH-regular Human (70-30) 100 UNIT/ML injection Inject 10 Units into the skin 2 (two) times daily with a meal. RELION BRAND  PLEASE (Patient taking differently: Inject 10 Units into the skin See admin instructions. Pt uses this as needed according to blood sugar levels.  RELION BRAND PLEASE "Takes if needed") 10 mL 11   Lancets (ONETOUCH DELICA PLUS [id number]) [place] Apply topically.     magnesium oxide (MAG-OX) 400 MG tablet Take 0.5 tablets (200 mg total) by mouth at bedtime. 30 tablet 0   methocarbamol (ROBAXIN) 750 MG tablet Take 1 tablet (750 mg total) by mouth every 8 (eight) hours as needed for muscle spasms. 60 tablet 0   metoprolol succinate (TOPROL XL) 50 MG 24 hr tablet Take 1 tablet (50 mg total) by mouth daily. Take with or immediately following a meal. 90 tablet 3   nicotine (NICODERM CQ - DOSED IN MG/24 HR) 7 mg/24hr patch 7 mg patch daily x2 weeks and stop 28 patch 1   ONETOUCH ULTRA test strip      oxyCODONE (OXY IR/ROXICODONE) 5 MG immediate release tablet Take 1 tablet (5 mg total) by mouth 2 (two) times daily as needed for moderate pain. Do not Fill Before 12/ 21/2023. 60 tablet 0   pantoprazole (PROTONIX) 40 MG tablet Take 1 tablet (40 mg total) by mouth daily. 30 tablet 0   polyethylene glycol (MIRALAX / GLYCOLAX) 17 g packet Take 17 g by mouth daily. 14 each 0   Vitamin D, Ergocalciferol, (DRISDOL) 1.25 MG (50000 UNIT) CAPS capsule Take 1 capsule (50,000 Units total) by mouth every 7 (seven) days. 5 capsule 0   losartan (COZAAR) 50 MG tablet Take 1 tablet (50 mg total) by mouth daily. 90 tablet 1   No facility-administered medications prior to visit.     ROS Review of Systems  Constitutional:  Negative for activity change and appetite change.  HENT:  Negative for sinus pressure and sore throat.   Respiratory:  Negative for chest tightness, shortness of breath and wheezing.   Cardiovascular:  Negative for chest pain and palpitations.  Gastrointestinal:  Negative for abdominal distention, abdominal pain and constipation.  Genitourinary: Negative.  Musculoskeletal:        See HPI   Psychiatric/Behavioral:  Negative for behavioral problems and dysphoric mood.     Objective:  BP (!) 164/91   Pulse 87   Temp 98.2 F (36.8 C) (Oral)   Ht '6\' 1"'$  (1.854 m)   SpO2 98%   BMI 23.75 kg/m      11/26/2022    3:18 PM 11/26/2022    2:37 PM 11/10/2022    2:32 PM  BP/Weight  Systolic BP [phone]  Diastolic BP 91 83 97      Physical Exam Constitutional:      Appearance: He is well-developed.  Cardiovascular:     Rate and Rhythm: Normal rate.     Heart sounds: Normal heart sounds. No murmur heard. Pulmonary:     Effort: Pulmonary effort is normal.     Breath sounds: Normal breath sounds. No wheezing or rales.  Chest:     Chest wall: No tenderness.  Abdominal:     General: Bowel sounds are normal. There is no distension.     Palpations: Abdomen is soft. There is no mass.     Tenderness: There is no abdominal tenderness.  Musculoskeletal:     Right lower leg: No edema.     Comments: Left BKA  Neurological:     Mental Status: He is alert and oriented to person, place, and time.  Psychiatric:        Mood and Affect: Mood normal.        Latest Ref Rng & Units 09/26/2022    4:18 AM 09/25/2022    3:48 AM 08/27/2022   10:13 AM  CMP  Glucose 70 - 99 mg/dL 119  172  107   BUN 8 - 23 mg/dL '21  15  11   '$ Creatinine 0.61 - 1.24 mg/dL 1.08  1.07  0.96   Sodium 135 - 145 mmol/L 134  134  138   Potassium 3.5 - 5.1 mmol/L 3.6  4.3  4.2   Chloride 98 - 111 mmol/L 99  99  101   CO2 22 - 32 mmol/L '26  25  25   '$ Calcium 8.9 - 10.3 mg/dL 8.3  8.2  8.5   Total Protein 6.0 - 8.5 g/dL   6.8   Total Bilirubin 0.0 - 1.2 mg/dL   0.4   Alkaline Phos 44 - 121 IU/L   128   AST 0 - 40 IU/L   12   ALT 0 - 44 IU/L   7     Lipid Panel     Component Value Date/Time   CHOL 133 08/27/2022 1013   TRIG 90 08/27/2022 1013   HDL 50 08/27/2022 1013   CHOLHDL 2.7 08/27/2022 1013   CHOLHDL 4.9 06/05/2022 0355   VLDL 24 06/05/2022 0355   LDLCALC 66 08/27/2022 1013    CBC     Component Value Date/Time   WBC 5.3 09/26/2022 0418   RBC 2.94 (L) 09/26/2022 0418   HGB 7.8 (L) 09/26/2022 0418   HGB 8.9 (L) 08/27/2022 1013   HCT 23.6 (L) 09/26/2022 0418   HCT 29.1 (L) 08/27/2022 1013   PLT 121 (L) 09/26/2022 0418   PLT 135 (L) 08/27/2022 1013   MCV 80.3 09/26/2022 0418   MCV 87 08/27/2022 1013   MCH 26.5 09/26/2022 0418   MCHC 33.1 09/26/2022 0418   RDW 14.8 09/26/2022 0418   RDW 16.2 (H) 08/27/2022 1013   LYMPHSABS 1.0 08/04/2022 5035  MONOABS 0.4 08/04/2022 0508   EOSABS 0.1 08/04/2022 0508   BASOSABS 0.0 08/04/2022 3086    Lab Results  Component Value Date   HGBA1C 6.4 10/29/2022    Assessment & Plan:  1. Hypertension associated with diabetes ([place]) Uncontrolled There is a discrepancy between his blood pressures in the office and ambulatory readings Advised to bring in his blood pressure monitor at his next visit so that this can be compared with ours Losartan dose increased Check potassium at next visit Follow-up with the clinical pharmacist to reassess blood pressure control Counseled on blood pressure goal of less than 130/80, low-sodium, DASH diet, medication compliance, 150 minutes of moderate intensity exercise per week. Discussed medication compliance, adverse effects. - losartan (COZAAR) 100 MG tablet; Take 1 tablet (100 mg total) by mouth daily.  Dispense: 90 tablet; Refill: 1  2. Chronic pain of right knee It appears his wheelchair is too small for him and patient's height is 6'1" We will refer him to physical therapy for seating evaluation Voltaren gel has been ineffective and he is currently on opioid analgesics which have been ineffective Unable to use oral NSAIDs due to the fact that he is on Eliquis and is at risk of bleeding Cortisone injection might be beneficial - AMB referral to orthopedics - Ambulatory referral to Physical Therapy   Will have the clinical pharmacist look into the cost of [place] and available options for  him.  Meds ordered this encounter  Medications   losartan (COZAAR) 100 MG tablet    Sig: Take 1 tablet (100 mg total) by mouth daily.    Dispense:  90 tablet    Refill:  1    Dose increase    Follow-up: Return in about 2 weeks (around 12/10/2022) for Blood pressure follow-up with Lurena Joiner, Chronic medical conditions with PCP in 3 months.       Charlott Rakes, MD, FAAFP. [facility] and [facility] [place], [place]   11/26/2022, 5:29 PM

## 2022-11-27 ENCOUNTER — Other Ambulatory Visit: Payer: Self-pay

## 2022-12-02 ENCOUNTER — Ambulatory Visit: Payer: Medicaid Other | Admitting: Orthopedic Surgery

## 2022-12-04 NOTE — Telephone Encounter (Signed)
Approved on October 16, 2022 [id number];Review Type:Prior Auth;Coverage Start Date:10/16/2022;Coverage End Date:10/17/2023;

## 2022-12-08 ENCOUNTER — Encounter: Payer: Medicaid Other | Admitting: Registered Nurse

## 2022-12-10 ENCOUNTER — Ambulatory Visit: Payer: Self-pay | Admitting: *Deleted

## 2022-12-10 NOTE — Telephone Encounter (Signed)
  Chief Complaint: Dizziness Symptoms: Pt evasive historian. States "Two days ago a wave came over me twice like I was going to pass out. Then went away." Initially denied dizziness, "Just this wave." Denies visual changes when occurs.  Did later state "Spinning a few times when I turned my head fast."  BP today 160/78.  States no appetite "Hadn't eaten in 3 days, just didn't feel like it." States is staying hydrated.  Stuffy nose, cough at times.  Frequency: 2-4 days Pertinent Negatives: Patient denies  Disposition: []$ ED /[]$ Urgent Care (no appt availability in office) / []$ Appointment(In office/virtual)/ []$  [facility]/ []$ Home Care/ []$ Refused Recommended Disposition /[]$ [facility]/ [x]$  Follow-up with PCP Additional Notes: Pt cancelled appt today "Just didn't feel well."  States transportation is an issue. [facility] rescheduled with [facility]. Care advise provided,advised ED for worsening symptoms, pt verbalizes understanding. Unsure if pt needs to be seen sooner, can pt get assist with transportation? Please advise Reason for Disposition  [1] NO dizziness now AND [2] age > 27  Answer Assessment - Initial Assessment Questions 1. DESCRIPTION: "Describe your dizziness."     Spinning 2. VERTIGO: "Do you feel like either you or the room is spinning or tilting?"      Yes 3. LIGHTHEADED: "Do you feel lightheaded?" (e.g., somewhat faint, woozy, weak upon standing)     Not presently 4. SEVERITY: "How bad is it?"  "Can you walk?"   - MILD: Feels slightly dizzy and unsteady, but is walking normally.   - MODERATE: Feels unsteady when walking, but not falling; interferes with normal activities (e.g., school, work).   - SEVERE: Unable to walk without falling, or requires assistance to walk without falling.     Doesn't happen daily 5. ONSET:  "When did the dizziness begin?"     Several days ago 6. AGGRAVATING FACTORS: "Does anything make it worse?" (e.g., standing, change in head position)      Sitting to standing 7. CAUSE: "What do you think is causing the dizziness?"     Unsure 8. RECURRENT SYMPTOM: "Have you had dizziness before?" If Yes, ask: "When was the last time?" "What happened that time?"      9. OTHER SYMPTOMS: "Do you have any other symptoms?" (e.g., headache, weakness, numbness, vomiting, earache)     Cough, stuffy nose  Protocols used: Dizziness - Vertigo-A-AH

## 2022-12-11 ENCOUNTER — Ambulatory Visit: Payer: Medicaid Other | Admitting: Registered Nurse

## 2022-12-12 NOTE — Telephone Encounter (Signed)
Call placed to patient unable to reach message unable to leave message mailbox is full.

## 2022-12-15 ENCOUNTER — Ambulatory Visit: Payer: Self-pay | Admitting: Pharmacist

## 2023-01-13 ENCOUNTER — Encounter: Payer: Medicaid Other | Attending: Registered Nurse | Admitting: Physical Medicine and Rehabilitation

## 2023-01-13 DIAGNOSIS — G894 Chronic pain syndrome: Secondary | ICD-10-CM | POA: Insufficient documentation

## 2023-01-13 DIAGNOSIS — I10 Essential (primary) hypertension: Secondary | ICD-10-CM | POA: Insufficient documentation

## 2023-01-13 DIAGNOSIS — G546 Phantom limb syndrome with pain: Secondary | ICD-10-CM | POA: Insufficient documentation

## 2023-01-13 DIAGNOSIS — Z89512 Acquired absence of left leg below knee: Secondary | ICD-10-CM | POA: Insufficient documentation

## 2023-01-13 DIAGNOSIS — G8918 Other acute postprocedural pain: Secondary | ICD-10-CM | POA: Insufficient documentation

## 2023-01-14 NOTE — Progress Notes (Deleted)
   S:     PCP: Dr. Margarita Rana  62 y.o. male who presents for hypertension evaluation, education, and management.  PMH is significant for NSTEMI, T2DM, HTN, HFimpEF, ureteral cancer, and HLD.  Patient was referred and last seen by Primary Care Provider, Dr. Margarita Rana, on 11/26/2022. BP was uncontrolled at that time, however patient had reported home BP readings that varied from clinic readings.  Today, patient arrives in *** spirits and presents without *** assistance. *** Denies dizziness, headache, blurred vision, swelling.   Patient reports hypertension was diagnosed in ***.   Family/Social history:  -Fhx: HTN, cancer, DM -Tobacco: quit 2003 -Alcohol: denies  Medication adherence *** . Patient has *** taken BP medications today.   Current antihypertensives include: losartan 100 mg once daily, Toprol XL 50 mg once daily  Antihypertensives tried in the past include: amlodipine (rash)  Reported home BP readings: ***  Patient reported dietary habits: Eats *** meals/day Breakfast: *** Lunch: *** Dinner: *** Snacks: *** Drinks: ***  Patient-reported exercise habits: ***  ASCVD risk factors include: ***  O:  ROS  Physical Exam  Last 3 Office BP readings: BP Readings from Last 3 Encounters:  11/26/22 (!) 164/91  11/10/22 (!) 180/97  10/29/22 (!) 190/72    BMET    Component Value Date/Time   NA 134 (L) 09/26/2022 0418   NA 138 08/27/2022 1013   K 3.6 09/26/2022 0418   CL 99 09/26/2022 0418   CO2 26 09/26/2022 0418   GLUCOSE 119 (H) 09/26/2022 0418   BUN 21 09/26/2022 0418   BUN 11 08/27/2022 1013   CREATININE 1.08 09/26/2022 0418   CREATININE 0.89 11/07/2015 1400   CALCIUM 8.3 (L) 09/26/2022 0418   GFRNONAA >60 09/26/2022 0418   GFRNONAA >89 06/01/2014 1505   GFRAA 48 (L) 01/06/2019 1058   GFRAA >89 06/01/2014 1505    Renal function: CrCl cannot be calculated (Patient's most recent lab result is older than the maximum 21 days allowed.).  Clinical ASCVD: Yes   The ASCVD Risk score (Arnett DK, et al., 2019) failed to calculate for the following reasons:   The patient has a prior MI or stroke diagnosis  A/P: Hypertension diagnosed *** currently *** on current medications. BP goal < 130/80 *** mmHg. Medication adherence appears ***. Control is suboptimal due to ***.  -{Meds adjust:18428} ***.  -{Meds adjust:18428} ***.  -Patient educated on purpose, proper use, and potential adverse effects of ***.  -F/u labs ordered - *** -Counseled on lifestyle modifications for blood pressure control including reduced dietary sodium, increased exercise, adequate sleep. -Encouraged patient to check BP at home and bring log of readings to next visit. Counseled on proper use of home BP cuff.   Results reviewed and written information provided.    Written patient instructions provided. Patient verbalized understanding of treatment plan.  Total time in face to face counseling *** minutes.    Follow-up:  Pharmacist ***. PCP clinic visit in 02/25/2023  Maryan Puls, PharmD PGY-1 [facility] Resident

## 2023-01-15 ENCOUNTER — Ambulatory Visit: Payer: Medicaid Other | Admitting: Pharmacist

## 2023-02-11 ENCOUNTER — Other Ambulatory Visit: Payer: Self-pay | Admitting: Family Medicine

## 2023-02-13 ENCOUNTER — Telehealth: Payer: Self-pay | Admitting: Physical Medicine and Rehabilitation

## 2023-02-13 NOTE — Telephone Encounter (Signed)
Patient hasn't been here since January and wanted to make an appointment.  I asked him if he was on medications, and he said that he has a patch on now, but oxycodone he is out of those.  I asked him who filled his medication since he hasn't been here and he said he has been doing without.  I need to know how you would like to handle this.  He said he has seen Riley Lam in the past, but that was in December when he saw her.  Please advise.

## 2023-02-19 NOTE — Progress Notes (Signed)
Subjective:    Patient ID: Robert Burnett, male    DOB: 1961-05-25, 62 y.o.   MRN: [id number]  HPI: Robert Burnett is a 62 y.o. male who returns for follow up appointment for chronic pain and medication refill. He states his  pain is located in his ribs and bilateral hip pain. He rates his pain 8. He's not following a exercise regimen at this time, he was encouraged to increase HEP as tolerated.   [name] last office visit was on 01/22/204, he states the reason he wasn't able to return for his scheduled appointment, he was sick. I asked if he sought medical attention, he stated no. He was looking for refill on his Fentanyl and Oxycodone. His last fill of Fentanyl was on 11/14/2022 and Oxycodone on 11/10/2022.   Oral Swab was Performed today, awaiting on results. He verbalizes understanding.      Pain Inventory Average Pain 8 Pain Right Now 8 My pain is constant, sharp, stabbing, and aching  In the last 24 hours, has pain interfered with the following? General activity 9 Relation with others 7 Enjoyment of life 10 What TIME of day is your pain at its worst? morning , daytime, evening, and night Sleep (in general)  poor to fair  Pain is worse with: walking, sitting, and standing Pain improves with: medication Relief from Meds: 9  Family History  Problem Relation Age of Onset   Cancer Mother    Hypertension Father    Diabetes Father    Lung cancer Father    Diabetes Sister    Cancer Sister    Social History   Socioeconomic History   Marital status: Single    Spouse name: Not on file   Number of children: Not on file   Years of education: Not on file   Highest education level: Not on file  Occupational History   Not on file  Tobacco Use   Smoking status: Every Day    Types: Pipe   Smokeless tobacco: Former    Types: Chew   Tobacco comments:    Using pipe until about a week ago (05/2022)    Quit cigarettes 2003ish  Vaping Use   Vaping Use: Never used   Substance and Sexual Activity   Alcohol use: No   Drug use: No   Sexual activity: Not on file  Other Topics Concern   Not on file  Social History Narrative   Lives in [place] by himself.  Sedentary in the setting of chronic arthritic pain impacting his chest/back along w/ L foot amputation.   Social Determinants of Health   Financial Resource Strain: Not on file  Food Insecurity: No Food Insecurity (09/24/2022)   Hunger Vital Sign    Worried About Running Out of Food in the Last Year: Never true    Ran Out of Food in the Last Year: Never true  Transportation Needs: No Transportation Needs (09/24/2022)   PRAPARE - Administrator, Civil Service (Medical): No    Lack of Transportation (Non-Medical): No  Physical Activity: Not on file  Stress: Not on file  Social Connections: Not on file   Past Surgical History:  Procedure Laterality Date   ACHILLES TENDON SURGERY Left 03/28/2022   Procedure: ACHILLES LENGTHENING/KIDNER;  Surgeon: Edwin Cap, DPM;  [facility];  Service: Podiatry;  Laterality: Left;   AMPUTATION Left 03/25/2022   Procedure: AMPUTATION DIGITS,TRANSMETARSAL AMPUTATION, REMOVAL OF TOES AND BONE BEHIND TOES ;  Surgeon:  Edwin Cap, DPM;  [facility];  Service: Podiatry;  Laterality: Left;   AMPUTATION Left 07/23/2022   Procedure: LEFT BELOW KNEE AMPUTATION;  Surgeon: Nadara Mustard, MD;  Location: [facility] OR;  Service: Orthopedics;  Laterality: Left;   APPLICATION OF WOUND VAC Left 03/25/2022   Procedure: APPLICATION OF WOUND VAC;  Surgeon: Edwin Cap, DPM;  [facility];  Service: Podiatry;  Laterality: Left;   CORONARY STENT INTERVENTION N/A 06/05/2022   Procedure: CORONARY STENT INTERVENTION;  Surgeon: Lyn Records, MD;  [facility];  Service: Cardiovascular;  Laterality: N/A;   CYSTOSCOPY/RETROGRADE/URETEROSCOPY Left 10/29/2020   Procedure: CYSTOSCOPY/RETROGRADE/ LEFT URETEROSCOPY WITH BIOPSY, LEFT URETERAL STENT;   Surgeon: Heloise Purpura, MD;  [facility];  Service: Urology;  Laterality: Left;   IRRIGATION AND DEBRIDEMENT FOOT Left 03/28/2022   Procedure: AMPUTATION LISFRANC, SECONDARY WOUND CLOSURE;  Surgeon: Edwin Cap, DPM;  [facility];  Service: Podiatry;  Laterality: Left;   KNEE SURGERY Bilateral 1975 and 1989   LEFT HEART CATH AND CORONARY ANGIOGRAPHY N/A 06/05/2022   Procedure: LEFT HEART CATH AND CORONARY ANGIOGRAPHY;  Surgeon: Lyn Records, MD;  [facility];  Service: Cardiovascular;  Laterality: N/A;   MULTIPLE TOOTH EXTRACTIONS     ROBOT ASSITED LAPAROSCOPIC NEPHROURETERECTOMY Left 12/13/2020   Procedure: XI ROBOT ASSITED LAPAROSCOPIC NEPHROURETERECTOMY , POST OPERATIVE INTRAVESICAL GEMCITABINE;  Surgeon: Heloise Purpura, MD;  [facility];  Service: Urology;  Laterality: Left;  ONLY NEEDS 240 MIN   STUMP REVISION Left 09/24/2022   Procedure: REVISION LEFT BELOW KNEE AMPUTATION;  Surgeon: Nadara Mustard, MD;  Location: [facility] OR;  Service: Orthopedics;  Laterality: Left;   TOEM AMPUTATION  Left    2012, second toe   TRANSURETHRAL RESECTION OF BLADDER TUMOR N/A 08/29/2021   Procedure: TRANSURETHRAL RESECTION OF BLADDER TUMOR (TURBT) WITH CYSTOSCOPY;  Surgeon: Heloise Purpura, MD;  [facility];  Service: Urology;  Laterality: N/A;  GENERAL ANESTHESIA WITH PARALYSIS   WISDOM TOOTH EXTRACTION     Past Surgical History:  Procedure Laterality Date   ACHILLES TENDON SURGERY Left 03/28/2022   Procedure: ACHILLES LENGTHENING/KIDNER;  Surgeon: Edwin Cap, DPM;  [facility];  Service: Podiatry;  Laterality: Left;   AMPUTATION Left 03/25/2022   Procedure: AMPUTATION DIGITS,TRANSMETARSAL AMPUTATION, REMOVAL OF TOES AND BONE BEHIND TOES ;  Surgeon: Edwin Cap, DPM;  [facility];  Service: Podiatry;  Laterality: Left;   AMPUTATION Left 07/23/2022   Procedure: LEFT BELOW KNEE AMPUTATION;  Surgeon: Nadara Mustard, MD;  Location: [facility] OR;  Service:  Orthopedics;  Laterality: Left;   APPLICATION OF WOUND VAC Left 03/25/2022   Procedure: APPLICATION OF WOUND VAC;  Surgeon: Edwin Cap, DPM;  [facility];  Service: Podiatry;  Laterality: Left;   CORONARY STENT INTERVENTION N/A 06/05/2022   Procedure: CORONARY STENT INTERVENTION;  Surgeon: Lyn Records, MD;  [facility];  Service: Cardiovascular;  Laterality: N/A;   CYSTOSCOPY/RETROGRADE/URETEROSCOPY Left 10/29/2020   Procedure: CYSTOSCOPY/RETROGRADE/ LEFT URETEROSCOPY WITH BIOPSY, LEFT URETERAL STENT;  Surgeon: Heloise Purpura, MD;  [facility];  Service: Urology;  Laterality: Left;   IRRIGATION AND DEBRIDEMENT FOOT Left 03/28/2022   Procedure: AMPUTATION LISFRANC, SECONDARY WOUND CLOSURE;  Surgeon: Edwin Cap, DPM;  [facility];  Service: Podiatry;  Laterality: Left;   KNEE SURGERY Bilateral 1975 and 1989   LEFT HEART CATH AND CORONARY ANGIOGRAPHY N/A 06/05/2022   Procedure: LEFT HEART CATH  AND CORONARY ANGIOGRAPHY;  Surgeon: Lyn Records, MD;  Location: [facility];  Service: Cardiovascular;  Laterality: N/A;   MULTIPLE TOOTH EXTRACTIONS     ROBOT ASSITED LAPAROSCOPIC NEPHROURETERECTOMY Left 12/13/2020   Procedure: XI ROBOT ASSITED LAPAROSCOPIC NEPHROURETERECTOMY , POST OPERATIVE INTRAVESICAL GEMCITABINE;  Surgeon: Heloise Purpura, MD;  [facility];  Service: Urology;  Laterality: Left;  ONLY NEEDS 240 MIN   STUMP REVISION Left 09/24/2022   Procedure: REVISION LEFT BELOW KNEE AMPUTATION;  Surgeon: Nadara Mustard, MD;  Location: [facility] OR;  Service: Orthopedics;  Laterality: Left;   TOEM AMPUTATION  Left    2012, second toe   TRANSURETHRAL RESECTION OF BLADDER TUMOR N/A 08/29/2021   Procedure: TRANSURETHRAL RESECTION OF BLADDER TUMOR (TURBT) WITH CYSTOSCOPY;  Surgeon: Heloise Purpura, MD;  [facility];  Service: Urology;  Laterality: N/A;  GENERAL ANESTHESIA WITH PARALYSIS   WISDOM TOOTH EXTRACTION     Past Medical History:  Diagnosis Date    Allergy    Anemia    Anemia    Arthritis    ARTHRITIS IN SPINE - MAKES PT HAVE CHEST PAIN- USES fENTANYL PATCH    Asthma    IN TEENS    Bacteremia    a. 03/2022 Group B Strep bacteremia in setting of diabetic L foot infxn/gangrene/osteo.   CAD (coronary artery disease)    Chronic HFrEF (heart failure with reduced ejection fraction) (HCC)    Depression    Diabetes mellitus    Diabetic peripheral neuropathy associated with type 2 diabetes mellitus (HCC) 12/12/2011   Fatty liver disease, nonalcoholic 10/27/2016   Foot osteomyelitis, left (HCC)    a. 2012 s/p toe amputations on L; b. 03/2022 - admitted w/ wet gangrene and necrotizing infxn w/ osteomyelitis-->s/p transmetatarsal followed by Lisfranc amputation.   GERD (gastroesophageal reflux disease)    H/O degenerative disc disease    History of blood transfusion    History of echocardiogram    a. 03/2022 Echo: EF 55-60%, no rwma, nl RV fxn, mildly elev PASP. Mild MR.   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(HCC)    BP (!) 158/87   Pulse 78   Ht 6\' 1"  (1.854 m)   SpO2 98%   BMI 23.75 kg/m   Opioid Risk Score:   Fall Risk Score:  `1  Depression screen [facility] 2/9     02/20/2023    9:56 AM 11/26/2022    2:40 PM 10/29/2022    9:58 AM 10/06/2022   10:54 AM 08/14/2022   11:30 AM 04/30/2022    9:49 AM 01/06/2019    9:27 AM  Depression screen PHQ 2/9  Decreased Interest 1 3 0 0 0 3 0  Down, Depressed, Hopeless 1 0 0 0 0 1 0  PHQ - 2 Score 2 3 0 0 0 4 0  Altered sleeping  0 0  0 0   Tired, decreased energy  0 0  0 3   Change in appetite  2 0  0 1   Feeling bad or failure about yourself   0 0  0 0   Trouble concentrating  0 0  0 0   Moving slowly or fidgety/restless  0 0  0 0   Suicidal thoughts  0 0  0 0   PHQ-9 Score  5 0  0 8     Review of Systems  Musculoskeletal:  Positive for back pain and gait problem.       Pain in the rib cage  All other systems reviewed and  are negative.      Objective:   Physical Exam Vitals and nursing note reviewed.  Constitutional:      Appearance: Normal appearance.  Cardiovascular:     Rate and Rhythm: Normal rate and regular rhythm.     Pulses: Normal pulses.     Heart sounds: Normal heart sounds.  Pulmonary:     Effort: Pulmonary effort is normal.     Breath sounds: Normal breath sounds.  Musculoskeletal:     Cervical back: Normal range of motion and neck supple.     Comments: Normal Muscle Bulk and Muscle Testing Reveals:  Upper Extremities: Full ROM and Muscle Strength 5/5  Lower Extremities: Right Full ROM and Muscle Strength 5/5 Left BKA Arrived in wheelchair    Skin:    General: Skin is warm and dry.  Neurological:     Mental Status: He is alert and oriented to person, place, and time.  Psychiatric:        Mood and Affect: Mood normal.        Behavior: Behavior normal.         Assessment & Plan:  S/P BKA left,: On 09/24/2022 he underwent: By Dr. Lajoyce Corners REVISION LEFT BELOW KNEE AMPUTATION Left [facility]  Ortho Following. Continue to Monitor. 10/06/2022  Type 2 DM with long-term insulin use: Continue current medication: PCP Following, Continue to Monitor 02/20/2023 ,Essential Hypertension: Continue current medication regimen. PCP Following. Continue to Monitor. 02/20/2023  Hyperlipidemia.: Continue current medication regimen. PCP following. Continue to Monitor. 02/20/2023 Chronic Costochondritis: Chest/ Back Pain Chronic Pain Syndrome:  Oral Swab was Performed Today: Awaiting Results.  Post-Operative Pain : Continue Gabapentin. Continue to monitor.  Phantom Pain: Continue Gabapentin. Continue to Monitor.    F/U in 1 month

## 2023-02-20 ENCOUNTER — Encounter: Payer: 59 | Attending: Registered Nurse | Admitting: Registered Nurse

## 2023-02-20 ENCOUNTER — Encounter: Payer: Self-pay | Admitting: Registered Nurse

## 2023-02-20 VITALS — BP 159/64 | HR 78 | Ht 73.0 in

## 2023-02-20 DIAGNOSIS — G894 Chronic pain syndrome: Secondary | ICD-10-CM | POA: Diagnosis not present

## 2023-02-20 DIAGNOSIS — G548 Other nerve root and plexus disorders: Secondary | ICD-10-CM

## 2023-02-20 DIAGNOSIS — M940 Chondrocostal junction syndrome [Tietze]: Secondary | ICD-10-CM | POA: Diagnosis not present

## 2023-02-20 DIAGNOSIS — R52 Pain, unspecified: Secondary | ICD-10-CM | POA: Insufficient documentation

## 2023-02-20 DIAGNOSIS — Z5181 Encounter for therapeutic drug level monitoring: Secondary | ICD-10-CM

## 2023-02-20 DIAGNOSIS — Z79899 Other long term (current) drug therapy: Secondary | ICD-10-CM

## 2023-02-20 DIAGNOSIS — I10 Essential (primary) hypertension: Secondary | ICD-10-CM

## 2023-02-20 DIAGNOSIS — Z89512 Acquired absence of left leg below knee: Secondary | ICD-10-CM

## 2023-02-25 ENCOUNTER — Telehealth: Payer: Self-pay | Admitting: Registered Nurse

## 2023-02-25 ENCOUNTER — Ambulatory Visit: Payer: Self-pay | Admitting: Family Medicine

## 2023-02-25 LAB — DRUG TOX MONITOR 1 W/CONF, ORAL FLD
Amphetamines: NEGATIVE ng/mL (ref <10)
Barbiturates: NEGATIVE ng/mL (ref <10)
Benzodiazepines: NEGATIVE ng/mL (ref <0.50)
Buprenorphine: NEGATIVE ng/mL (ref <0.10)
Cocaine: NEGATIVE ng/mL (ref <5.0)
Cotinine: >250 ng/mL — ABNORMAL HIGH (ref <5.0)
Fentanyl: 9.56 ng/mL — ABNORMAL HIGH (ref <0.10)
Fentanyl: POSITIVE ng/mL — AB (ref <0.10)
Heroin Metabolite: NEGATIVE ng/mL (ref <1.0)
MARIJUANA: NEGATIVE ng/mL (ref <2.5)
MDMA: NEGATIVE ng/mL (ref <10)
Meprobamate: NEGATIVE ng/mL (ref <2.5)
Methadone: NEGATIVE ng/mL (ref <5.0)
Nicotine Metabolite: POSITIVE ng/mL — AB (ref <5.0)
Opiates: NEGATIVE ng/mL (ref <2.5)
Phencyclidine: NEGATIVE ng/mL (ref <10)
Tapentadol: NEGATIVE ng/mL (ref <5.0)
Tramadol: NEGATIVE ng/mL (ref <5.0)
Zolpidem: NEGATIVE ng/mL (ref <5.0)

## 2023-02-25 LAB — DRUG TOX ALC METAB W/CON, ORAL FLD: Alcohol Metabolite: NEGATIVE ng/mL (ref <25)

## 2023-02-25 NOTE — Telephone Encounter (Signed)
As Robert Burnett states, his oral drug swab is positive for Fentanyl which was last prescribed in January of this year. I called and spoke with him and he says that he used them sparingly and had one left as well as a couple of oxycodone which he did report to [place] [facility] before his test. The oxycodone was not present because it had been several days. This is highly unusual that someone would have one patch left over and a few oxycodone from both of their narcotics last prescribed in January and just happened to take the left overs before his visit. He has had one formal warning letter for having unprescribed hydrocodone in his system on a previous drug screen. This inconsistency would qualify as terms for discharge from the practice.

## 2023-02-25 NOTE — Telephone Encounter (Signed)
Dr Carlis Abbott, his Oral Swab shows Fentanyl. His last prescribed Fentanyl was in January. This is a [employer], he already had a warning when he took somebody's medication in the past.  I just want to make sure you understood my previous message.

## 2023-02-25 NOTE — Telephone Encounter (Signed)
Dr Carlis Abbott,  Biagio Borg seen this patient on January 22nd, 2024.  He had missed sebveral appointments, he states due to transportation. He called office looking for refills. He was scheduled with me.  He was seen on 02/20/2023.  According to PMP last Fentanyl was filled on 11/14/2022.  A Oral swab was performed and results positive with Fentanyl, not sure how he could still have Fentanyl. In the past he had occurent where he took someones medication. You were prescribing the Fentanyl for his Costochronditis and he was prescribed oxycodone.  How would you like to proceed with this patient.

## 2023-02-27 NOTE — Telephone Encounter (Signed)
I spoke with Mr. Robert Burnett informing him of the discharge from the clinic due to the second inconsistent drug screen. He denies that he has done anything wrong. He states that he has used his fentanyl patches sparingly just when he needed them and has stretched the Rx out even cutting the patches in half and his oxycodone which he took prior to his visit. I explained to him that he was not using the medications as prescribed, fentanyl is not intended to be used only as needed, and cutting is not recommended. I explained that we have strict rules with our narcotic prescriptions and if medications are not being used they must be brought in for disposal. Saving old narcotics is not allowed. I asked about withdrawal since he did not return after January for refills but he denies any symptoms of withdrawal. He wanted Dr Carlis Abbott to know he apologizes and wanted her to know that he did nothing wrong. I told him I would relay the message but that the decision stands.

## 2023-03-06 ENCOUNTER — Ambulatory Visit: Payer: Medicaid Other | Admitting: Physical Medicine and Rehabilitation

## 2023-03-23 ENCOUNTER — Other Ambulatory Visit: Payer: Self-pay | Admitting: Family Medicine

## 2023-03-23 ENCOUNTER — Ambulatory Visit: Payer: Medicaid Other | Admitting: Registered Nurse

## 2023-03-23 DIAGNOSIS — R12 Heartburn: Secondary | ICD-10-CM

## 2023-03-24 NOTE — Telephone Encounter (Signed)
Requested Prescriptions  Pending Prescriptions Disp Refills   pantoprazole (PROTONIX) 40 MG tablet [Pharmacy Med Name: PANTOPRAZOLE SODIUM 40MG  TABLET DR] 90 tablet 0    Sig: TAKE ONE TABLET BY MOUTH ONCE DAILY     Gastroenterology: Proton Pump Inhibitors Passed - 03/23/2023  1:11 PM      Passed - Valid encounter within last 12 months    Recent Outpatient Visits           3 months ago Hypertension associated with diabetes [facility])   [place] [facility] [place], [place], MD   4 months ago Type 2 diabetes mellitus with left diabetic foot infection (HCC)   [place] [facility] [place], [place], MD   10 months ago Type 2 diabetes mellitus with left diabetic foot infection [facility])   [place] [facility] [place], Odette Horns, MD   4 years ago Type 2 diabetes mellitus with peripheral neuropathy [facility])   [facility] [place], Asencion Partridge, MD   4 years ago Type 2 diabetes mellitus with peripheral neuropathy [facility])   [facility], Levell July, MD

## 2023-04-20 ENCOUNTER — Other Ambulatory Visit: Payer: Self-pay

## 2023-04-24 ENCOUNTER — Other Ambulatory Visit: Payer: Self-pay | Admitting: Family Medicine

## 2023-04-24 DIAGNOSIS — I480 Paroxysmal atrial fibrillation: Secondary | ICD-10-CM

## 2023-04-24 NOTE — Telephone Encounter (Signed)
Requested Prescriptions  Pending Prescriptions Disp Refills   ELIQUIS 5 MG TABS tablet [Pharmacy Med Name: ELIQUIS 5MG  TABLET] 180 tablet 0    Sig: TAKE ONE TABLET BY MOUTH TWICE A DAY     Hematology:  Anticoagulants - apixaban Failed - 04/24/2023 10:16 AM      Failed - PLT in normal range and within 360 days    Platelets  Date Value Ref Range Status  09/26/2022 121 (L) 150 - 400 K/uL Final  08/27/2022 135 (L) 150 - 450 x10E3/uL Final         Failed - HGB in normal range and within 360 days    Hemoglobin  Date Value Ref Range Status  09/26/2022 7.8 (L) 13.0 - 17.0 g/dL Final  09/81/1914 8.9 (L) 13.0 - 17.7 g/dL Final         Failed - HCT in normal range and within 360 days    HCT  Date Value Ref Range Status  09/26/2022 23.6 (L) 39.0 - 52.0 % Final   Hematocrit  Date Value Ref Range Status  08/27/2022 29.1 (L) 37.5 - 51.0 % Final         Passed - Cr in normal range and within 360 days    Creat  Date Value Ref Range Status  11/07/2015 0.89 0.70 - 1.33 mg/dL Final   Creatinine, Ser  Date Value Ref Range Status  09/26/2022 1.08 0.61 - 1.24 mg/dL Final   Creatinine,U  Date Value Ref Range Status  09/09/2010 324.7 mg/dL Final    Comment:    See lab report for associated comment(s)         Passed - AST in normal range and within 360 days    AST  Date Value Ref Range Status  08/27/2022 12 0 - 40 IU/L Final         Passed - ALT in normal range and within 360 days    ALT  Date Value Ref Range Status  08/27/2022 7 0 - 44 IU/L Final         Passed - Valid encounter within last 12 months    Recent Outpatient Visits           4 months ago Hypertension associated with diabetes (HCC)   [place] [facility] [place], [place], MD   5 months ago Type 2 diabetes mellitus with left diabetic foot infection (HCC)   [place] [facility] [place], Odette Horns, MD   11 months ago Type 2 diabetes mellitus with left diabetic foot  infection (HCC)   [place] [facility] [place], Odette Horns, MD   4 years ago Type 2 diabetes mellitus with peripheral neuropathy [facility])   [facility], Asencion Partridge, MD   4 years ago Type 2 diabetes mellitus with peripheral neuropathy [facility])   [facility], Levell July, MD

## 2023-04-30 ENCOUNTER — Other Ambulatory Visit: Payer: Self-pay | Admitting: Family Medicine

## 2023-05-01 ENCOUNTER — Other Ambulatory Visit: Payer: Self-pay | Admitting: Family Medicine

## 2023-05-01 NOTE — Telephone Encounter (Signed)
Requested medication (s) are due for refill today: routing for review  Requested medication (s) are on the active medication list: no  Last refill:  unknown, not on current list  Future visit scheduled: no  Notes to clinic:  Unable to refill per protocol, Rx expired. Medication is not on current list, routing for review.      Requested Prescriptions  Pending Prescriptions Disp Refills   furosemide (LASIX) 40 MG tablet [Pharmacy Med Name: FUROSEMIDE 40MG  TABLET] 30 tablet 0    Sig: TAKE ONE TABLET BY MOUTH ONCE DAILY AS NEEDED FOR EDEMA OR FLUID. TAKE ONE TABLET BY MOUTH IF YOUR WEIGHT INCREASES MORE THAN TWO POUNDS IN 24 HOURS     Cardiovascular:  Diuretics - Loop Failed - 05/01/2023  2:09 PM      Failed - K in normal range and within 180 days    Potassium  Date Value Ref Range Status  09/26/2022 3.6 3.5 - 5.1 mmol/L Final         Failed - Ca in normal range and within 180 days    Calcium  Date Value Ref Range Status  09/26/2022 8.3 (L) 8.9 - 10.3 mg/dL Final   Calcium, Ion  Date Value Ref Range Status  03/25/2022 1.17 1.15 - 1.40 mmol/L Final         Failed - Na in normal range and within 180 days    Sodium  Date Value Ref Range Status  09/26/2022 134 (L) 135 - 145 mmol/L Final  08/27/2022 138 134 - 144 mmol/L Final         Failed - Cr in normal range and within 180 days    Creat  Date Value Ref Range Status  11/07/2015 0.89 0.70 - 1.33 mg/dL Final   Creatinine, Ser  Date Value Ref Range Status  09/26/2022 1.08 0.61 - 1.24 mg/dL Final   Creatinine,U  Date Value Ref Range Status  09/09/2010 324.7 mg/dL Final    Comment:    See lab report for associated comment(s)         Failed - Cl in normal range and within 180 days    Chloride  Date Value Ref Range Status  09/26/2022 99 98 - 111 mmol/L Final         Failed - Mg Level in normal range and within 180 days    Magnesium  Date Value Ref Range Status  07/26/2022 1.7 1.7 - 2.4 mg/dL Final    Comment:     Performed at [facility], [address], [place], [place] [address]         Failed - Last BP in normal range    BP Readings from Last 1 Encounters:  02/20/23 (!) 159/64         Passed - Valid encounter within last 6 months    Recent Outpatient Visits           5 months ago Hypertension associated with diabetes [facility])   [place] [facility] [place], [place], MD   6 months ago Type 2 diabetes mellitus with left diabetic foot infection [facility])   [place] [facility] [place], Odette Horns, MD   1 year ago Type 2 diabetes mellitus with left diabetic foot infection [facility])   [place] [facility] [place], Odette Horns, MD   4 years ago Type 2 diabetes mellitus with peripheral neuropathy [facility])   [facility] [place], Asencion Partridge, MD  4 years ago Type 2 diabetes mellitus with peripheral neuropathy [facility])   [facility], Levell July, MD

## 2023-05-04 NOTE — Telephone Encounter (Signed)
Requested medication (s) are due for refill today: Yes  Requested medication (s) are on the active medication list: Yes  Last refill:  09/24/22  Future visit scheduled: No  Notes to clinic:  Historical provider.    Requested Prescriptions  Pending Prescriptions Disp Refills   Lancets (ONETOUCH DELICA PLUS LANCET33G) MISC [Pharmacy Med Name: ONETOUCH DELICA PLUS LANCETS EXTRA FINE 33G PLUS 33G MISC] 100 each PRN    Sig: USE ONE LANCETS TO TEST BLOOD SUGAR THREE TIMES A DAY     Endocrinology: Diabetes - Testing Supplies Passed - 05/01/2023  5:13 PM      Passed - Valid encounter within last 12 months    Recent Outpatient Visits           5 months ago Hypertension associated with diabetes [facility])   [place] [facility] [place], [place], MD   6 months ago Type 2 diabetes mellitus with left diabetic foot infection (HCC)   [place] [facility] [place], [place], MD   1 year ago Type 2 diabetes mellitus with left diabetic foot infection (HCC)   [place] [facility] [place], [place], MD   4 years ago Type 2 diabetes mellitus with peripheral neuropathy [facility])   [facility] [place], Asencion Partridge, MD   4 years ago Type 2 diabetes mellitus with peripheral neuropathy [facility])   [facility], Levell July, MD               [facility] ULTRA TEST test strip [Pharmacy Med Name: ONETOUCH ULTRA ULTRA STRIP] 100 each PRN    Sig: USE ONE TEST STRIP TO TEST BLOOD SUGAR THREE TIMES DAILY     There is no refill protocol information for this order

## 2023-05-15 ENCOUNTER — Telehealth: Payer: Self-pay | Admitting: *Deleted

## 2023-05-15 ENCOUNTER — Other Ambulatory Visit: Payer: Self-pay | Admitting: Family Medicine

## 2023-05-15 ENCOUNTER — Telehealth: Payer: Self-pay | Admitting: Family Medicine

## 2023-05-15 MED ORDER — INSULIN NPH ISOPHANE & REGULAR (70-30) 100 UNIT/ML ~~LOC~~ SUSP: 10.0000 [IU] | Freq: Two times a day (BID) | SUBCUTANEOUS | 1 refills | Status: DC

## 2023-05-15 NOTE — Telephone Encounter (Signed)
Rxn sent to requested pharmacy.

## 2023-05-15 NOTE — Telephone Encounter (Signed)
Per agent: I just wanted to give you the heads up on pt Robert Burnett XNA#[id number]. He has apologized but due to an oversight he noticed his insulin refills had expired and he called his pharmacy to send a new refill request that is in the system already. Unfortunately he is going to run out over the weekend and needs this as soon as possible. He sincerely appreciates all that can be done and is so very sorry. His pharmacy is [facility]. They do have this medicine in stock waiting.  Medication has been pended for provider- it was listed as historical- so [facility] nurse unable to fill- message sent to office for review

## 2023-05-15 NOTE — Telephone Encounter (Signed)
Requested medication (s) are due for refill today:   Requested medication (s) are on the active medication list: Yes  Last refill:  01/12/23  Future visit scheduled: Yes  Notes to clinic:  Historical provider.    Requested Prescriptions  Pending Prescriptions Disp Refills   NOVOLIN 70/30 KWIKPEN (70-30) 100 UNIT/ML KwikPen [Pharmacy Med Name: NOVOLIN 70/30 FLEXPEN 70/30 FP SUSP PEN-INJ] 15 mL 11    Sig: INJECT 28 UNITS INTO THE SKIN TWO TIMES DAILY     Endocrinology:  Diabetes - Insulins Failed - 05/15/2023  2:44 PM      Failed - HBA1C is between 0 and 7.9 and within 180 days    HbA1c, POC (controlled diabetic range)  Date Value Ref Range Status  10/29/2022 6.4 0.0 - 7.0 % Final         Passed - Valid encounter within last 6 months    Recent Outpatient Visits           5 months ago Hypertension associated with diabetes [facility])   [facility] [place], [place], [place]   6 months ago Type 2 diabetes mellitus with left diabetic foot infection (HCC)   [facility] [place], [place], [place]   1 year ago Type 2 diabetes mellitus with left diabetic foot infection (HCC)   [facility] [place], [place]ette Horns, MD   4 years ago Type 2 diabetes mellitus with peripheral neuropathy [facility])   [facility] [place], [place], MD   4 years ago Type 2 diabetes mellitus with peripheral neuropathy [facility])   [facility] [place], [place], MD       Future Appointments             In 2 months Hoy Register, MD [facility]

## 2023-05-15 NOTE — Addendum Note (Signed)
Addended by: Lois Huxley, Jeannett Senior L on: 05/15/2023 04:19 PM   Modules accepted: Orders

## 2023-05-15 NOTE — Telephone Encounter (Signed)
Thayer Ohm called from [facility] [phone] to see if provider could send in s script for the Humulin 70-30 Quick Pen instead of the insulin NPH-regular Human (70-30) 100 UNIT/ML injection. Please f/u with pharmacy asap.

## 2023-05-18 NOTE — Telephone Encounter (Signed)
See note- Rx was sent to pharmacy for patient : insulin isophane & regular human KwikPen (NOVOLIN 70/30 KWIKPEN) (70-30) 100 UNIT/ML KwikPen

## 2023-07-28 ENCOUNTER — Encounter: Payer: Self-pay | Admitting: Family Medicine

## 2023-07-28 ENCOUNTER — Telehealth: Payer: Self-pay

## 2023-07-28 ENCOUNTER — Ambulatory Visit: Payer: 59 | Attending: Family Medicine | Admitting: Family Medicine

## 2023-07-28 VITALS — BP 155/90 | HR 97

## 2023-07-28 DIAGNOSIS — L089 Local infection of the skin and subcutaneous tissue, unspecified: Secondary | ICD-10-CM

## 2023-07-28 DIAGNOSIS — Z7984 Long term (current) use of oral hypoglycemic drugs: Secondary | ICD-10-CM | POA: Diagnosis not present

## 2023-07-28 DIAGNOSIS — I480 Paroxysmal atrial fibrillation: Secondary | ICD-10-CM | POA: Diagnosis not present

## 2023-07-28 DIAGNOSIS — G8929 Other chronic pain: Secondary | ICD-10-CM

## 2023-07-28 DIAGNOSIS — E11628 Type 2 diabetes mellitus with other skin complications: Secondary | ICD-10-CM

## 2023-07-28 DIAGNOSIS — Z1211 Encounter for screening for malignant neoplasm of colon: Secondary | ICD-10-CM

## 2023-07-28 DIAGNOSIS — Z89512 Acquired absence of left leg below knee: Secondary | ICD-10-CM

## 2023-07-28 DIAGNOSIS — Z9861 Coronary angioplasty status: Secondary | ICD-10-CM | POA: Diagnosis not present

## 2023-07-28 DIAGNOSIS — I2510 Atherosclerotic heart disease of native coronary artery without angina pectoris: Secondary | ICD-10-CM

## 2023-07-28 LAB — POCT GLYCOSYLATED HEMOGLOBIN (HGB A1C): HbA1c, POC (controlled diabetic range): 8.7 % — AB (ref 0.0–7.0)

## 2023-07-28 MED ORDER — APIXABAN 5 MG PO TABS
5.0000 mg | ORAL_TABLET | Freq: Two times a day (BID) | ORAL | 1 refills | Status: DC
Start: 2023-07-28 — End: 2023-12-08

## 2023-07-28 MED ORDER — TRAMADOL HCL 50 MG PO TABS
50.0000 mg | ORAL_TABLET | Freq: Every evening | ORAL | 1 refills | Status: DC | PRN
Start: 2023-07-28 — End: 2023-12-08

## 2023-07-28 MED ORDER — METOPROLOL SUCCINATE ER 50 MG PO TB24: 50.0000 mg | ORAL_TABLET | Freq: Every day | ORAL | 1 refills | Status: DC

## 2023-07-28 MED ORDER — FUROSEMIDE 40 MG PO TABS: 40.0000 mg | ORAL_TABLET | Freq: Every day | ORAL | 1 refills | Status: DC

## 2023-07-28 MED ORDER — METHOCARBAMOL 750 MG PO TABS: 750.0000 mg | ORAL_TABLET | Freq: Three times a day (TID) | ORAL | 3 refills | Status: DC | PRN

## 2023-07-28 MED ORDER — VALSARTAN 320 MG PO TABS: 320.0000 mg | ORAL_TABLET | Freq: Every day | ORAL | 3 refills | Status: DC

## 2023-07-28 MED ORDER — GABAPENTIN 300 MG PO CAPS: 300.0000 mg | ORAL_CAPSULE | Freq: Three times a day (TID) | ORAL | 3 refills | Status: DC

## 2023-07-28 MED ORDER — ATORVASTATIN CALCIUM 80 MG PO TABS: 80.0000 mg | ORAL_TABLET | Freq: Every day | ORAL | 1 refills | Status: DC

## 2023-07-28 MED ORDER — NOVOLIN 70/30 FLEXPEN (70-30) 100 UNIT/ML ~~LOC~~ SUPN: 28.0000 [IU] | PEN_INJECTOR | Freq: Two times a day (BID) | SUBCUTANEOUS | 6 refills | Status: DC

## 2023-07-28 MED ORDER — DAPAGLIFLOZIN PROPANEDIOL 10 MG PO TABS: 10.0000 mg | ORAL_TABLET | Freq: Every day | ORAL | 1 refills | Status: DC

## 2023-07-28 NOTE — Progress Notes (Unsigned)
Subjective:  Patient ID: Robert Burnett, male    DOB: 03-May-1961  Age: 62 y.o. MRN: [id number]  CC: Medical Management of Chronic Issues (Referral to ortho/Not sleeping/Pain in both hands/)   HPI Robert Burnett is a 62 y.o. 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  History of Present Illness       He is under the care of pain management due to his left BKA.    Past Medical History:  Diagnosis Date   Allergy    Anemia    Anemia    Arthritis    ARTHRITIS IN SPINE - MAKES PT HAVE CHEST PAIN- USES fENTANYL PATCH    Asthma    IN TEENS    Bacteremia    a. 03/2022 Group B Strep bacteremia in setting of diabetic L foot infxn/gangrene/osteo.   CAD (coronary artery disease)    Chronic HFrEF (heart failure with reduced ejection fraction) (HCC)    Depression    Diabetes mellitus    Diabetic peripheral neuropathy associated with type 2 diabetes mellitus (HCC) 12/12/2011   Fatty liver disease, nonalcoholic 10/27/2016   Foot osteomyelitis, left (HCC)    a. 2012 s/p toe amputations on L; b. 03/2022 - admitted w/ wet gangrene and necrotizing infxn w/ osteomyelitis-->s/p transmetatarsal followed by Lisfranc amputation.   GERD (gastroesophageal reflux disease)    H/O degenerative disc disease    History of blood transfusion    History of echocardiogram    a. 03/2022 Echo: EF 55-60%, no rwma, nl RV fxn, mildly elev PASP. Mild MR.   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[facility])     Past  Surgical History:  Procedure Laterality Date   ACHILLES TENDON SURGERY Left 03/28/2022   Procedure: ACHILLES LENGTHENING/KIDNER;  Surgeon: Edwin Cap, DPM;  Location: [facility];  Service: Podiatry;  Laterality: Left;   AMPUTATION Left 03/25/2022   Procedure: AMPUTATION DIGITS,TRANSMETARSAL AMPUTATION, REMOVAL OF TOES AND BONE BEHIND TOES ;  Surgeon: Edwin Cap, DPM;  Location: [facility];  Service: Podiatry;  Laterality: Left;   AMPUTATION Left 07/23/2022   Procedure: LEFT BELOW KNEE AMPUTATION;  Surgeon: Nadara Mustard, MD;  Location[facility];  Service: Orthopedics;  Laterality: Left;   APPLICATION OF WOUND VAC Left 03/25/2022   Procedure: APPLICATION OF WOUND VAC;  Surgeon: Edwin Cap, DPM;  Location: [facility];  Service: Podiatry;  Laterality: Left;   CORONARY STENT INTERVENTION N/A 06/05/2022   Procedure: CORONARY STENT INTERVENTION;  Surgeon: Lyn Records, MD;  Location[facility];  Service: Cardiovascular;  Laterality: N/A;   CYSTOSCOPY/RETROGRADE/URETEROSCOPY Left 10/29/2020   Procedure: CYSTOSCOPY/RETROGRADE/ LEFT URETEROSCOPY WITH BIOPSY, LEFT URETERAL STENT;  Surgeon: Heloise Purpura, MD;  Location: [facility];  Service: Urology;  Laterality: Left;   IRRIGATION AND DEBRIDEMENT FOOT Left 03/28/2022   Procedure: AMPUTATION LISFRANC, SECONDARY WOUND CLOSURE;  Surgeon: Edwin Cap, DPM;  Location: [facility];  Service: Podiatry;  Laterality: Left;   KNEE SURGERY Bilateral 1975 and  1989   LEFT HEART CATH AND CORONARY ANGIOGRAPHY N/A 06/05/2022   Procedure: LEFT HEART CATH AND CORONARY ANGIOGRAPHY;  Surgeon: Lyn Records, MD;  Location: [facility];  Service: Cardiovascular;  Laterality: N/A;   MULTIPLE TOOTH EXTRACTIONS     ROBOT ASSITED LAPAROSCOPIC NEPHROURETERECTOMY Left 12/13/2020   Procedure: XI ROBOT ASSITED LAPAROSCOPIC NEPHROURETERECTOMY , POST OPERATIVE INTRAVESICAL GEMCITABINE;  Surgeon: Heloise Purpura, MD;  Location: [facility];  Service: Urology;  Laterality: Left;  ONLY  NEEDS 240 MIN   STUMP REVISION Left 09/24/2022   Procedure: REVISION LEFT BELOW KNEE AMPUTATION;  Surgeon: Nadara Mustard, MD;  Location: [facility];  Service: Orthopedics;  Laterality: Left;   TOEM AMPUTATION  Left    2012, second toe   TRANSURETHRAL RESECTION OF BLADDER TUMOR N/A 08/29/2021   Procedure: TRANSURETHRAL RESECTION OF BLADDER TUMOR (TURBT) WITH CYSTOSCOPY;  Surgeon: Heloise Purpura, MD;  Location: [facility];  Service: Urology;  Laterality: N/A;  GENERAL ANESTHESIA WITH PARALYSIS   WISDOM TOOTH EXTRACTION      Family History  Problem Relation Age of Onset   Cancer Mother    Hypertension Father    Diabetes Father    Lung cancer Father    Diabetes Sister    Cancer Sister     Social History   Socioeconomic History   Marital status: Single    Spouse name: Not on file   Number of children: Not on file   Years of education: Not on file   Highest education level: Not on file  Occupational History   Not on file  Tobacco Use   Smoking status: Every Day    Types: Pipe   Smokeless tobacco: Former    Types: Chew   Tobacco comments:    Using pipe until about a week ago (05/2022)    Quit cigarettes 2003ish  Vaping Use   Vaping status: Never Used  Substance and Sexual Activity   Alcohol use: No   Drug use: No   Sexual activity: Not on file  Other Topics Concern   Not on file  Social History Narrative   Lives in [place] by himself.  Sedentary in the setting of chronic arthritic pain impacting his chest/back along w/ L foot amputation.   Social Determinants of Health   Financial Resource Strain: Not on file  Food Insecurity: No Food Insecurity (09/24/2022)   Hunger Vital Sign    Worried About Running Out of Food in the Last Year: Never true    Ran Out of Food in the Last Year: Never true  Transportation Needs: No Transportation Needs (09/24/2022)   PRAPARE - Administrator, Civil Service (Medical): No    Lack of Transportation (Non-Medical): No  Physical Activity:  Not on file  Stress: Not on file  Social Connections: Not on file    Allergies  Allergen Reactions   Amitriptyline Hcl     Confusion   Baclofen    Cymbalta [Duloxetine Hcl]     confusion   Amlodipine Rash   Prozac [Fluoxetine Hcl] Rash    Outpatient Medications Prior to Visit  Medication Sig Dispense Refill   acetaminophen (TYLENOL) 325 MG tablet Take 1-2 tablets (325-650 mg total) by mouth every 4 (four) hours as needed for mild pain.     apixaban (ELIQUIS) 5 MG TABS tablet TAKE ONE TABLET BY MOUTH TWICE A DAY 180 tablet 0   ascorbic acid (VITAMIN C) 1000 MG tablet Take 1 tablet (1,000 mg total) by mouth  daily. 30 tablet 0   atorvastatin (LIPITOR) 80 MG tablet Take 1 tablet (80 mg total) by mouth daily. 90 tablet 1   clopidogrel (PLAVIX) 75 MG tablet TAKE ONE TABLET BY MOUTH ONCE DAILY 90 tablet 1   dapagliflozin propanediol (FARXIGA) 10 MG TABS tablet Take 1 tablet (10 mg total) by mouth daily before breakfast. 90 tablet 1   diclofenac Sodium (VOLTAREN) 1 % GEL Apply 2 g topically 4 (four) times daily as needed (Left hip pain). 200 g 0   docusate sodium (COLACE) 100 MG capsule Take 1 capsule (100 mg total) by mouth daily. (Patient taking differently: Take 100 mg by mouth daily as needed.) 10 capsule 0   furosemide (LASIX) 40 MG tablet TAKE ONE TABLET BY MOUTH ONCE DAILY AS NEEDED FOR EDEMA OR FLUID. TAKE ONE TABLET BY MOUTH IF YOUR WEIGHT INCREASES MORE THAN TWO POUNDS IN 24 HOURS 30 tablet 0   gabapentin (NEURONTIN) 300 MG capsule Take 1 capsule (300 mg total) by mouth 3 (three) times daily. 90 capsule 3   GNP ULTICARE PEN NEEDLES 31G X 5 MM MISC      insulin isophane & regular human KwikPen (NOVOLIN 70/30 KWIKPEN) (70-30) 100 UNIT/ML KwikPen INJECT 28 UNITS INTO THE SKIN TWO TIMES DAILY 15 mL 0   Lancets (ONETOUCH DELICA PLUS LANCET33G) MISC USE ONE LANCETS TO TEST BLOOD SUGAR THREE TIMES A DAY 100 each 0   losartan (COZAAR) 100 MG tablet Take 1 tablet (100 mg total) by mouth  daily. 90 tablet 1   magnesium oxide (MAG-OX) 400 MG tablet Take 0.5 tablets (200 mg total) by mouth at bedtime. 30 tablet 0   methocarbamol (ROBAXIN) 750 MG tablet Take 1 tablet (750 mg total) by mouth every 8 (eight) hours as needed for muscle spasms. 60 tablet 0   metoprolol succinate (TOPROL XL) 50 MG 24 hr tablet Take 1 tablet (50 mg total) by mouth daily. Take with or immediately following a meal. 90 tablet 3   naloxone (NARCAN) nasal spray 4 mg/0.1 mL SMARTSIG:Both Nares     nicotine (NICODERM CQ - DOSED IN MG/24 HR) 7 mg/24hr patch 7 mg patch daily x2 weeks and stop 28 patch 1   ONETOUCH ULTRA TEST test strip USE ONE TEST STRIP TO TEST BLOOD SUGAR THREE TIMES DAILY 100 each 0   pantoprazole (PROTONIX) 40 MG tablet TAKE ONE TABLET BY MOUTH ONCE DAILY 90 tablet 0   polyethylene glycol (MIRALAX / GLYCOLAX) 17 g packet Take 17 g by mouth daily. 14 each 0   Vitamin D, Ergocalciferol, (DRISDOL) 1.25 MG (50000 UNIT) CAPS capsule Take 1 capsule (50,000 Units total) by mouth every 7 (seven) days. 5 capsule 0   No facility-administered medications prior to visit.     ROS Review of Systems  Constitutional:  Negative for activity change and appetite change.  HENT:  Negative for sinus pressure and sore throat.   Eyes:  Negative for visual disturbance.  Respiratory:  Negative for cough, chest tightness and shortness of breath.   Cardiovascular:  Negative for chest pain and leg swelling.  Gastrointestinal:  Negative for abdominal distention, abdominal pain, constipation and diarrhea.  Endocrine: Negative.   Genitourinary:  Negative for dysuria.  Musculoskeletal:  Negative for joint swelling and myalgias.  Skin:  Negative for rash.  Allergic/Immunologic: Negative.   Neurological:  Negative for weakness, light-headedness and numbness.  Psychiatric/Behavioral:  Negative for dysphoric mood and suicidal ideas.     Objective:  BP (!) 173/90   Pulse  97   SpO2 99%      07/28/2023    2:28 PM  02/20/2023   10:27 AM 02/20/2023    9:45 AM  BP/Weight  Systolic BP 173 159 158  Diastolic BP 90 64 87      Physical Exam Constitutional:      Appearance: He is well-developed.  Cardiovascular:     Rate and Rhythm: Normal rate.     Heart sounds: Normal heart sounds. No murmur heard. Pulmonary:     Effort: Pulmonary effort is normal.     Breath sounds: Normal breath sounds. No wheezing or rales.  Chest:     Chest wall: No tenderness.  Abdominal:     General: Bowel sounds are normal. There is no distension.     Palpations: Abdomen is soft. There is no mass.     Tenderness: There is no abdominal tenderness.  Musculoskeletal:        General: Normal range of motion.     Right lower leg: No edema.     Comments: Left BKA  Neurological:     Mental Status: He is alert and oriented to person, place, and time.  Psychiatric:        Mood and Affect: Mood normal.        Latest Ref Rng & Units 09/26/2022    4:18 AM 09/25/2022    3:48 AM 08/27/2022   10:13 AM  CMP  Glucose 70 - 99 mg/dL 161  096  045   BUN 8 - 23 mg/dL 21  15  11    Creatinine 0.61 - 1.24 mg/dL 4.09  8.11  9.14   Sodium 135 - 145 mmol/L 134  134  138   Potassium 3.5 - 5.1 mmol/L 3.6  4.3  4.2   Chloride 98 - 111 mmol/L 99  99  101   CO2 22 - 32 mmol/L 26  25  25    Calcium 8.9 - 10.3 mg/dL 8.3  8.2  8.5   Total Protein 6.0 - 8.5 g/dL   6.8   Total Bilirubin 0.0 - 1.2 mg/dL   0.4   Alkaline Phos 44 - 121 IU/L   128   AST 0 - 40 IU/L   12   ALT 0 - 44 IU/L   7     Lipid Panel     Component Value Date/Time   CHOL 133 08/27/2022 1013   TRIG 90 08/27/2022 1013   HDL 50 08/27/2022 1013   CHOLHDL 2.7 08/27/2022 1013   CHOLHDL 4.9 06/05/2022 0355   VLDL 24 06/05/2022 0355   LDLCALC 66 08/27/2022 1013    CBC    Component Value Date/Time   WBC 5.3 09/26/2022 0418   RBC 2.94 (L) 09/26/2022 0418   HGB 7.8 (L) 09/26/2022 0418   HGB 8.9 (L) 08/27/2022 1013   HCT 23.6 (L) 09/26/2022 0418   HCT 29.1 (L) 08/27/2022  1013   PLT 121 (L) 09/26/2022 0418   PLT 135 (L) 08/27/2022 1013   MCV 80.3 09/26/2022 0418   MCV 87 08/27/2022 1013   MCH 26.5 09/26/2022 0418   MCHC 33.1 09/26/2022 0418   RDW 14.8 09/26/2022 0418   RDW 16.2 (H) 08/27/2022 1013   LYMPHSABS 1.0 08/04/2022 0508   MONOABS 0.4 08/04/2022 0508   EOSABS 0.1 08/04/2022 0508   BASOSABS 0.0 08/04/2022 0508    Lab Results  Component Value Date   HGBA1C 6.4 10/29/2022    Assessment & Plan:  Assessment and Plan  No orders of the defined types were placed in this encounter.   Follow-up: No follow-ups on file.       Hoy Register, MD, FAAFP. [facility] [place], [place] [phone]   07/28/2023, 2:40 PM

## 2023-07-28 NOTE — Patient Instructions (Signed)
Managing Your Hypertension Hypertension, also called high blood pressure, is when the force of the blood pressing against the walls of the arteries is too strong. Arteries are blood vessels that carry blood from your heart throughout your body. Hypertension forces the heart to work harder to pump blood and may cause the arteries to become narrow or stiff. Understanding blood pressure readings A blood pressure reading includes a higher number over a lower number: The first, or top, number is called the systolic pressure. It is a measure of the pressure in your arteries as your heart beats. The second, or bottom number, is called the diastolic pressure. It is a measure of the pressure in your arteries as the heart relaxes. For most people, a normal blood pressure is below 120/80. Your personal target blood pressure may vary depending on your medical conditions, your age, and other factors. Blood pressure is classified into four stages. Based on your blood pressure reading, your health care provider may use the following stages to determine what type of treatment you need, if any. Systolic pressure and diastolic pressure are measured in a unit called millimeters of mercury (mmHg). Normal Systolic pressure: below 120. Diastolic pressure: below 80. Elevated Systolic pressure: 120-129. Diastolic pressure: below 80. Hypertension stage 1 Systolic pressure: 130-139. Diastolic pressure: 80-89. Hypertension stage 2 Systolic pressure: 140 or above. Diastolic pressure: 90 or above. How can this condition affect me? Managing your hypertension is very important. Over time, hypertension can damage the arteries and decrease blood flow to parts of the body, including the brain, heart, and kidneys. Having untreated or uncontrolled hypertension can lead to: A heart attack. A stroke. A weakened blood vessel (aneurysm). Heart failure. Kidney damage. Eye damage. Memory and concentration problems. Vascular  dementia. What actions can I take to manage this condition? Hypertension can be managed by making lifestyle changes and possibly by taking medicines. Your health care provider will help you make a plan to bring your blood pressure within a normal range. You may be referred for counseling on a healthy diet and physical activity. Nutrition  Eat a diet that is high in fiber and potassium, and low in salt (sodium), added sugar, and fat. An example eating plan is called the DASH diet. DASH stands for Dietary Approaches to Stop Hypertension. To eat this way: Eat plenty of fresh fruits and vegetables. Try to fill one-half of your plate at each meal with fruits and vegetables. Eat whole grains, such as whole-wheat pasta, brown rice, or whole-grain bread. Fill about one-fourth of your plate with whole grains. Eat low-fat dairy products. Avoid fatty cuts of meat, processed or cured meats, and poultry with skin. Fill about one-fourth of your plate with lean proteins such as fish, chicken without skin, beans, eggs, and tofu. Avoid pre-made and processed foods. These tend to be higher in sodium, added sugar, and fat. Reduce your daily sodium intake. Many people with hypertension should eat less than 1,500 mg of sodium a day. Lifestyle  Work with your health care provider to maintain a healthy body weight or to lose weight. Ask what an ideal weight is for you. Get at least 30 minutes of exercise that causes your heart to beat faster (aerobic exercise) most days of the week. Activities may include walking, swimming, or biking. Include exercise to strengthen your muscles (resistance exercise), such as weight lifting, as part of your weekly exercise routine. Try to do these types of exercises for 30 minutes at least 3 days a week. Do   not use any products that contain nicotine or tobacco. These products include cigarettes, chewing tobacco, and vaping devices, such as e-cigarettes. If you need help quitting, ask your  health care provider. Control any long-term (chronic) conditions you have, such as high cholesterol or diabetes. Identify your sources of stress and find ways to manage stress. This may include meditation, deep breathing, or making time for fun activities. Alcohol use Do not drink alcohol if: Your health care provider tells you not to drink. You are pregnant, may be pregnant, or are planning to become pregnant. If you drink alcohol: Limit how much you have to: 0-1 drink a day for women. 0-2 drinks a day for men. Know how much alcohol is in your drink. In the [place] one drink equals one 12 oz bottle of beer (355 mL), one 5 oz glass of wine (148 mL), or one 1 oz glass of hard liquor (44 mL). Medicines Your health care provider may prescribe medicine if lifestyle changes are not enough to get your blood pressure under control and if: Your systolic blood pressure is 130 or higher. Your diastolic blood pressure is 80 or higher. Take medicines only as told by your health care provider. Follow the directions carefully. Blood pressure medicines must be taken as told by your health care provider. The medicine does not work as well when you skip doses. Skipping doses also puts you at risk for problems. Monitoring Before you monitor your blood pressure: Do not smoke, drink caffeinated beverages, or exercise within 30 minutes before taking a measurement. Use the bathroom and empty your bladder (urinate). Sit quietly for at least 5 minutes before taking measurements. Monitor your blood pressure at home as told by your health care provider. To do this: Sit with your back straight and supported. Place your feet flat on the floor. Do not cross your legs. Support your arm on a flat surface, such as a table. Make sure your upper arm is at heart level. Each time you measure, take two or three readings one minute apart and record the results. You may also need to have your blood pressure checked regularly by  your health care provider. General information Talk with your health care provider about your diet, exercise habits, and other lifestyle factors that may be contributing to hypertension. Review all the medicines you take with your health care provider because there may be side effects or interactions. Keep all follow-up visits. Your health care provider can help you create and adjust your plan for managing your high blood pressure. Where to find more information National Heart, Lung, and Blood Institute: www.nhlbi.nih.gov American Heart Association: www.heart.org Contact a health care provider if: You think you are having a reaction to medicines you have taken. You have repeated (recurrent) headaches. You feel dizzy. You have swelling in your ankles. You have trouble with your vision. Get help right away if: You develop a severe headache or confusion. You have unusual weakness or numbness, or you feel faint. You have severe pain in your chest or abdomen. You vomit repeatedly. You have trouble breathing. These symptoms may be an emergency. Get help right away. Call 911. Do not wait to see if the symptoms will go away. Do not drive yourself to the hospital. Summary Hypertension is when the force of blood pumping through your arteries is too strong. If this condition is not controlled, it may put you at risk for serious complications. Your personal target blood pressure may vary depending on your medical conditions,   your age, and other factors. For most people, a normal blood pressure is less than 120/80. Hypertension is managed by lifestyle changes, medicines, or both. Lifestyle changes to help manage hypertension include losing weight, eating a healthy, low-sodium diet, exercising more, stopping smoking, and limiting alcohol. This information is not intended to replace advice given to you by your health care provider. Make sure you discuss any questions you have with your health care  provider. Document Revised: 06/20/2021 Document Reviewed: 06/20/2021 Elsevier Patient Education  2024 Elsevier Inc.  

## 2023-07-28 NOTE — Telephone Encounter (Signed)
Pharmacy Patient Advocate Encounter   Received notification from CoverMyMeds that prior authorization for ELIQUIS is required/requested.   Insurance verification completed.   The patient is insured through [employer] .   Per test claim: PA required; PA submitted to [employer] via CoverMyMeds Key/confirmation #/EOC [id number] Status is pending

## 2023-07-29 ENCOUNTER — Encounter: Payer: Self-pay | Admitting: Family Medicine

## 2023-07-29 LAB — CBC WITH DIFFERENTIAL/PLATELET
Basophils Absolute: 0.1 10*3/uL (ref 0.0–0.2)
Basos: 1 %
EOS (ABSOLUTE): 0.2 10*3/uL (ref 0.0–0.4)
Eos: 2 %
Hematocrit: 39.8 % (ref 37.5–51.0)
Hemoglobin: 12.9 g/dL — ABNORMAL LOW (ref 13.0–17.7)
Immature Grans (Abs): 0 10*3/uL (ref 0.0–0.1)
Immature Granulocytes: 0 %
Lymphocytes Absolute: 2 10*3/uL (ref 0.7–3.1)
Lymphs: 21 %
MCH: 25.5 pg — ABNORMAL LOW (ref 26.6–33.0)
MCHC: 32.4 g/dL (ref 31.5–35.7)
MCV: 79 fL (ref 79–97)
Monocytes Absolute: 0.7 10*3/uL (ref 0.1–0.9)
Monocytes: 7 %
Neutrophils Absolute: 6.6 10*3/uL (ref 1.4–7.0)
Neutrophils: 69 %
Platelets: 257 10*3/uL (ref 150–450)
RBC: 5.05 x10E6/uL (ref 4.14–5.80)
RDW: 15.7 % — ABNORMAL HIGH (ref 11.6–15.4)
WBC: 9.7 10*3/uL (ref 3.4–10.8)

## 2023-07-29 LAB — CMP14+EGFR
ALT: 24 [IU]/L (ref 0–44)
AST: 22 [IU]/L (ref 0–40)
Albumin: 4.6 g/dL (ref 3.9–4.9)
Alkaline Phosphatase: 104 [IU]/L (ref 44–121)
BUN/Creatinine Ratio: 9 — ABNORMAL LOW (ref 10–24)
BUN: 11 mg/dL (ref 8–27)
Bilirubin Total: 0.4 mg/dL (ref 0.0–1.2)
CO2: 20 mmol/L (ref 20–29)
Calcium: 10.1 mg/dL (ref 8.6–10.2)
Chloride: 95 mmol/L — ABNORMAL LOW (ref 96–106)
Creatinine, Ser: 1.17 mg/dL (ref 0.76–1.27)
Globulin, Total: 3.4 g/dL (ref 1.5–4.5)
Glucose: 139 mg/dL — ABNORMAL HIGH (ref 70–99)
Potassium: 4.5 mmol/L (ref 3.5–5.2)
Sodium: 134 mmol/L (ref 134–144)
Total Protein: 8 g/dL (ref 6.0–8.5)
eGFR: 70 mL/min/{1.73_m2} (ref >59)

## 2023-09-02 ENCOUNTER — Other Ambulatory Visit: Payer: Self-pay | Admitting: Family Medicine

## 2023-09-03 ENCOUNTER — Telehealth: Payer: Self-pay | Admitting: Family Medicine

## 2023-09-03 NOTE — Telephone Encounter (Signed)
Rxn sent ~12:00p.

## 2023-09-03 NOTE — Telephone Encounter (Signed)
Requested medication (s) are due for refill today: yes  Requested medication (s) are on the active medication list: yes  Last refill:  01/12/23   Future visit scheduled: yes  Notes to clinic:  historical med and provider   Requested Prescriptions  Pending Prescriptions Disp Refills   Insulin Pen Needle (GNP ULTICARE PEN NEEDLES) 31G X 5 MM MISC [Pharmacy Med Name: GNP ULTICARE PEN NEEDLES 31G X 31GX5MM MISC] 200 each     Sig: USE TO INJECT INSULIN TWO TIMES DAILY     Endocrinology: Diabetes - Testing Supplies Passed - 09/02/2023 11:26 AM      Passed - Valid encounter within last 12 months    Recent Outpatient Visits           1 month ago Type 2 diabetes mellitus with left diabetic foot infection (HCC)   [place] [facility] [place]. [facility] Robert Register, MD   9 months ago Hypertension associated with diabetes [facility])   [place] [facility] - [facility] Dept Of [place]. [facility] Robert Register, MD   10 months ago Type 2 diabetes mellitus with left diabetic foot infection [facility])   [place] [facility] - A Dept Of [place]. [facility] Robert Register, MD   1 year ago Type 2 diabetes mellitus with left diabetic foot infection [facility])   [place] [facility] - A Dept Of [place]. [facility] [name], Odette Horns, MD   4 years ago Type 2 diabetes mellitus with peripheral neuropathy [facility])   [facility] [place], Asencion Partridge, MD       Future Appointments             In 4 days Robert Register, MD [facility] - A Dept Of [place]. [facility]            Signed Prescriptions Disp Refills   Lancets (ONETOUCH DELICA PLUS LANCET33G) MISC 100 each 11    Sig: USE ONE LANCETS TO TEST BLOOD SUGAR THREE TIMES A DAY     Endocrinology: Diabetes - Testing Supplies Passed - 09/02/2023 11:26 AM      Passed - Valid encounter within last 12 months    Recent  Outpatient Visits           1 month ago Type 2 diabetes mellitus with left diabetic foot infection (HCC)   [place] [facility] Dept Of [place]. [facility] Robert Register, MD   9 months ago Hypertension associated with diabetes [facility])   [place] [facility] - A Dept Of [place]. [facility] Robert Register, MD   10 months ago Type 2 diabetes mellitus with left diabetic foot infection [facility])   [place] [facility] - A Dept Of [place]. [facility] Robert Register, MD   1 year ago Type 2 diabetes mellitus with left diabetic foot infection [facility])   [place] [facility] - A Dept Of [place]. [facility] [name], Odette Horns, MD   4 years ago Type 2 diabetes mellitus with peripheral neuropathy [facility])   [facility] [place], Asencion Partridge, MD       Future Appointments             In 4 days Robert Register, MD [facility] - A Dept Of [place]. [facility]  [facility]ospital             ONETOUCH ULTRA test strip 100 strip 11    Sig: USE ONE TEST STRIP TO TEST BLOOD SUGAR THREE TIMES DAILY     Endocrinology: Diabetes - Testing Supplies Passed - 09/02/2023 11:26 AM      Passed - Valid encounter within last 12 months    Recent Outpatient Visits           1 month ago Type 2 diabetes mellitus with left diabetic foot infection (HCC)   [place] [facility] Dept Of [place]. [facility] Robert Register, MD   9 months ago Hypertension associated with diabetes [facility])   [place] [facility] - A Dept Of [place]. [facility] Robert Register, MD   10 months ago Type 2 diabetes mellitus with left diabetic foot infection [facility])   [place] [facility] - A Dept Of [place]. [facility] Robert Register, MD   1 year ago Type 2 diabetes mellitus with left diabetic foot infection [facility])   [place] [facility] - A Dept Of [place].  [facility] [name], Odette Horns, MD   4 years ago Type 2 diabetes mellitus with peripheral neuropathy [facility])   [facility] [place], Asencion Partridge, MD       Future Appointments             In 4 days Robert Register, MD [name] - A Dept Of . [facility]

## 2023-09-03 NOTE — Telephone Encounter (Signed)
Chris from [place] [facility] called in, states waiting on status of , Insulin Pen Needle 31G X 5 MM , pt has already received med supplies, and needs this pronto. I let him know this is being worked on

## 2023-09-03 NOTE — Telephone Encounter (Signed)
Requested Prescriptions  Pending Prescriptions Disp Refills   Insulin Pen Needle (GNP ULTICARE PEN NEEDLES) 31G X 5 MM MISC [Pharmacy Med Name: GNP ULTICARE PEN NEEDLES 31G X 31GX5MM MISC] 200 each     Sig: USE TO INJECT INSULIN TWO TIMES DAILY     Endocrinology: Diabetes - Testing Supplies Passed - 09/02/2023 11:26 AM      Passed - Valid encounter within last 12 months    Recent Outpatient Visits           1 month ago Type 2 diabetes mellitus with left diabetic foot infection (HCC)   [place] [facility] Dept Of [place]. [facility] Hoy Register, MD   9 months ago Hypertension associated with diabetes [facility])   [place] [facility] - A Dept Of [place]. [facility] Hoy Register, MD   10 months ago Type 2 diabetes mellitus with left diabetic foot infection [facility])   [place] [facility] - A Dept Of [place]. [facility] Hoy Register, MD   1 year ago Type 2 diabetes mellitus with left diabetic foot infection [facility])   [place] [facility] - A Dept Of [place]. [facility] [name], Odette Horns, MD   4 years ago Type 2 diabetes mellitus with peripheral neuropathy [facility])   [facility] [place], Asencion Partridge, MD       Future Appointments             In 4 days Hoy Register, MD [facility] - A Dept Of [place]. [facility]             Lancets [facility] DELICA PLUS [place]) MISC [Pharmacy Med Name: [place] PLUS LANCETS EXTRA FINE 33G PLUS 33G MISC] 100 each 11    Sig: USE ONE LANCETS TO TEST BLOOD SUGAR THREE TIMES A DAY     Endocrinology: Diabetes - Testing Supplies Passed - 09/02/2023 11:26 AM      Passed - Valid encounter within last 12 months    Recent Outpatient Visits           1 month ago Type 2 diabetes mellitus with left diabetic foot infection (HCC)   [place] [facility] A Dept Of [place]. [facility] Hoy Register, MD   9 months ago Hypertension associated with diabetes [facility])   [place] [facility] - A Dept Of [place]. [facility] Hoy Register, MD   10 months ago Type 2 diabetes mellitus with left diabetic foot infection [facility])   [place] [facility] - A Dept Of [place]. [facility] Hoy Register, MD   1 year ago Type 2 diabetes mellitus with left diabetic foot infection [facility])    [facility] - A Dept Of [place]. [facility] [name], Odette Horns, MD   4 years ago Type 2 diabetes mellitus with peripheral neuropathy [facility])   [facility] [place], Asencion Partridge, MD       Future Appointments             In 4 days Hoy Register, MD [facility] - A Dept Of [place]. [facility]             [facility] ULTRA test strip [place] Med Name: Richelle Ito STRIP] 100 strip 11    Sig: USE ONE TEST  STRIP TO TEST BLOOD SUGAR THREE TIMES DAILY     Endocrinology: Diabetes - Testing Supplies Passed - 09/02/2023 11:26 AM      Passed - Valid encounter within last 12 months    Recent Outpatient Visits           1 month ago Type 2 diabetes mellitus with left diabetic foot infection (HCC)   [place] [facility] A Dept Of [place]. [facility] Hoy Register, MD   9 months ago Hypertension associated with diabetes [facility])   [place] [facility] - A Dept Of [place]. [facility] Hoy Register, MD   10 months ago Type 2 diabetes mellitus with left diabetic foot infection [facility])   [place] [facility] - A Dept Of [place]. [facility] Hoy Register, MD   1 year ago Type 2 diabetes mellitus with left diabetic foot infection [facility])   [place] [facility] - A Dept Of [place]. [facility] [name], Odette Horns, MD   4 years ago Type 2 diabetes mellitus with peripheral neuropathy [facility])   [facility] [place],  Asencion Partridge, MD       Future Appointments             In 4 days Hoy Register, MD [facility] Merry Proud - A Dept Of [place]. [facility]

## 2023-09-07 ENCOUNTER — Encounter: Payer: Self-pay | Admitting: Family Medicine

## 2023-09-07 ENCOUNTER — Ambulatory Visit: Payer: 59 | Admitting: Family Medicine

## 2023-09-07 ENCOUNTER — Ambulatory Visit: Payer: 59 | Attending: Family Medicine | Admitting: Family Medicine

## 2023-09-07 VITALS — BP 168/85 | HR 82 | Ht 73.0 in

## 2023-09-07 DIAGNOSIS — R52 Pain, unspecified: Secondary | ICD-10-CM | POA: Diagnosis not present

## 2023-09-07 DIAGNOSIS — G548 Other nerve root and plexus disorders: Secondary | ICD-10-CM | POA: Diagnosis not present

## 2023-09-07 DIAGNOSIS — R0981 Nasal congestion: Secondary | ICD-10-CM | POA: Diagnosis not present

## 2023-09-07 DIAGNOSIS — I152 Hypertension secondary to endocrine disorders: Secondary | ICD-10-CM

## 2023-09-07 DIAGNOSIS — G4709 Other insomnia: Secondary | ICD-10-CM

## 2023-09-07 DIAGNOSIS — E1159 Type 2 diabetes mellitus with other circulatory complications: Secondary | ICD-10-CM

## 2023-09-07 DIAGNOSIS — M5412 Radiculopathy, cervical region: Secondary | ICD-10-CM

## 2023-09-07 DIAGNOSIS — Z794 Long term (current) use of insulin: Secondary | ICD-10-CM

## 2023-09-07 DIAGNOSIS — F17290 Nicotine dependence, other tobacco product, uncomplicated: Secondary | ICD-10-CM

## 2023-09-07 DIAGNOSIS — Z720 Tobacco use: Secondary | ICD-10-CM

## 2023-09-07 MED ORDER — FLUTICASONE PROPIONATE 50 MCG/ACT NA SUSP: 2.0000 | Freq: Every day | NASAL | 6 refills | Status: DC

## 2023-09-07 MED ORDER — LORATADINE 10 MG PO TABS: 10.0000 mg | ORAL_TABLET | Freq: Every day | ORAL | 6 refills | Status: DC

## 2023-09-07 MED ORDER — MISC. DEVICES MISC: 0 refills | Status: AC

## 2023-09-07 MED ORDER — GABAPENTIN 300 MG PO CAPS: 600.0000 mg | ORAL_CAPSULE | Freq: Three times a day (TID) | ORAL | 3 refills | Status: DC

## 2023-09-07 NOTE — Patient Instructions (Signed)
VISIT SUMMARY:  During today's visit, we discussed several of your ongoing health concerns, including your blood pressure, tingling in your hands and feet, nasal congestion, sleep difficulties, and neck pain. We reviewed your current medications and made some adjustments to better manage your symptoms.  YOUR PLAN:  -HYPERTENSION: Hypertension means high blood pressure. Your blood pressure was elevated today, but your home readings are well-controlled. We will recheck your blood pressure today to confirm the readings.  -PERIPHERAL NEUROPATHY: Peripheral neuropathy is a condition that causes tingling and numbness in your hands and feet. We will increase your gabapentin dose to 600mg  three times a day to help manage these symptoms.  -ALLERGIC RHINITIS: Allergic rhinitis is chronic nasal congestion caused by allergies. We recommend using a combination of Flonase and Claritin to help manage your symptoms.  -INSOMNIA: Insomnia is difficulty falling asleep or staying asleep. Increasing your gabapentin dose to 600mg  three times a day may also help with your sleep issues. We will reevaluate if the sleep problems persist.  -CERVICAL RADICULOPATHY: Cervical radiculopathy is a condition where a nerve in the neck is pinched, causing tingling and pain in the shoulders and neck. We may refer you to physical therapy if you agree after considering the cost.  INSTRUCTIONS:  We will recheck your blood pressure today to confirm the readings. Please continue taking your medications as prescribed, including the increased dose of gabapentin. Use both Flonase and Claritin for your nasal congestion. If your sleep issues persist, let us know. Consider physical therapy for your neck pain if it fits within your budget.

## 2023-09-07 NOTE — Progress Notes (Unsigned)
Subjective:  Patient ID: Robert Burnett, male    DOB: [birth year]  Age: 62 y.o. MRN: [id number]  CC: Hypertension (Stuffy nose/Tingling in feet/Trouble sleeping)   HPI Robert Burnett is a 62 y.o. 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  History of Present Illness            Past Medical History:  Diagnosis Date   Allergy    Anemia    Anemia    Arthritis    ARTHRITIS IN SPINE - MAKES PT HAVE CHEST PAIN- USES fENTANYL PATCH    Asthma    IN TEENS    Bacteremia    a. 03/2022 Group B Strep bacteremia in setting of diabetic L foot infxn/gangrene/osteo.   CAD (coronary artery disease)    Chronic HFrEF (heart failure with reduced ejection fraction) (HCC)    Depression    Diabetes mellitus    Diabetic peripheral neuropathy associated with type 2 diabetes mellitus (HCC) 12/12/2011   Fatty liver disease, nonalcoholic 10/27/2016   Foot osteomyelitis, left (HCC)    a. 2012 s/p toe amputations on L; b. 03/2022 - admitted w/ wet gangrene and necrotizing infxn w/ osteomyelitis-->s/p transmetatarsal followed by Lisfranc amputation.   GERD (gastroesophageal reflux disease)    H/O degenerative disc disease    History of blood transfusion    History of echocardiogram    a. 03/2022 Echo: EF 55-60%, no rwma, nl RV fxn, mildly elev PASP. Mild MR.   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[facility])     Past Surgical History:  Procedure Laterality Date   ACHILLES TENDON SURGERY Left 03/28/2022    Procedure: ACHILLES LENGTHENING/KIDNER;  Surgeon: Edwin Cap, DPM;  Location: [facility];  Service: Podiatry;  Laterality: Left;   AMPUTATION Left 03/25/2022   Procedure: AMPUTATION DIGITS,TRANSMETARSAL AMPUTATION, REMOVAL OF TOES AND BONE BEHIND TOES ;  Surgeon: Edwin Cap, DPM;  Location: [facility];  Service: Podiatry;  Laterality: Left;   AMPUTATION Left 07/23/2022   Procedure: LEFT BELOW KNEE AMPUTATION;  Surgeon: Nadara Mustard, MD;  Location[facility] OR;  Service: Orthopedics;  Laterality: Left;   APPLICATION OF WOUND VAC Left 03/25/2022   Procedure: APPLICATION OF WOUND VAC;  Surgeon: Edwin Cap, DPM;  Location: [facility];  Service: Podiatry;  Laterality: Left;   CORONARY STENT INTERVENTION N/A 06/05/2022   Procedure: CORONARY STENT INTERVENTION;  Surgeon: Lyn Records, MD;  Location: [facility] IN[facility]AS[facility] CV LAB;  Service: Cardiovascular;  Laterality: N/A;   CYSTOSCOPY/RETROGRADE/URETEROSCOPY Left 10/29/2020   Procedure: CYSTOSCOPY/RETROGRADE/ LEFT URETEROSCOPY WITH BIOPSY, LEFT URETERAL STENT;  Surgeon: Heloise Purpura, MD;  Location: [facility];  Service: Urology;  Laterality: Left;   IRRIGATION AND DEBRIDEMENT FOOT Left 03/28/2022   Procedure: AMPUTATION LISFRANC, SECONDARY WOUND CLOSURE;  Surgeon: Edwin Cap, DPM;  Location: [facility];  Service: Podiatry;  Laterality: Left;   KNEE SURGERY Bilateral 1975 and 1989   LEFT HEART CATH AND CORONARY ANGIOGRAPHY N/A 06/05/2022   Procedure: LEFT HEART CATH  AND CORONARY ANGIOGRAPHY;  Surgeon: Lyn Records, MD;  Location: [facility] INVASIVE CV LAB;  Service: Cardiovascular;  Laterality: N/A;   MULTIPLE TOOTH EXTRACTIONS     ROBOT ASSITED LAPAROSCOPIC NEPHROURETERECTOMY Left 12/13/2020   Procedure: XI ROBOT ASSITED LAPAROSCOPIC NEPHROURETERECTOMY , POST OPERATIVE INTRAVESICAL GEMCITABINE;  Surgeon: Heloise Purpura, MD;  Location: [facility];  Service: Urology;  Laterality: Left;  ONLY NEEDS 240 MIN   STUMP REVISION Left 09/24/2022   Procedure: REVISION LEFT BELOW KNEE  AMPUTATION;  Surgeon: Nadara Mustard, MD;  Location: [facility] OR;  Service: Orthopedics;  Laterality: Left;   TOEM AMPUTATION  Left    2012, second toe   TRANSURETHRAL RESECTION OF BLADDER TUMOR N/A 08/29/2021   Procedure: TRANSURETHRAL RESECTION OF BLADDER TUMOR (TURBT) WITH CYSTOSCOPY;  Surgeon: Heloise Purpura, MD;  Location: [facility];  Service: Urology;  Laterality: N/A;  GENERAL ANESTHESIA WITH PARALYSIS   WISDOM TOOTH EXTRACTION      Family History  Problem Relation Age of Onset   Cancer Mother    Hypertension Father    Diabetes Father    Lung cancer Father    Diabetes Sister    Cancer Sister     Social History   Socioeconomic History   Marital status: Single    Spouse name: Not on file   Number of children: Not on file   Years of education: Not on file   Highest education level: Not on file  Occupational History   Not on file  Tobacco Use   Smoking status: Every Day    Types: Pipe   Smokeless tobacco: Former    Types: Chew   Tobacco comments:    Using pipe until about a week ago (05/2022)    Quit cigarettes 2003ish  Vaping Use   Vaping status: Never Used  Substance and Sexual Activity   Alcohol use: No   Drug use: No   Sexual activity: Not on file  Other Topics Concern   Not on file  Social History Narrative   Lives in [place] by himself.  Sedentary in the setting of chronic arthritic pain impacting his chest/back along w/ L foot amputation.   Social Determinants of Health   Financial Resource Strain: Medium Risk (07/28/2023)   Overall Financial Resource Strain (CARDIA)    Difficulty of Paying Living Expenses: Somewhat hard  Food Insecurity: No Food Insecurity (07/28/2023)   Hunger Vital Sign    Worried About Running Out of Food in the Last Year: Never true    Ran Out of Food in the Last Year: Never true  Transportation Needs: No Transportation Needs (07/28/2023)   PRAPARE - Administrator, Civil Service (Medical): No    Lack of Transportation  (Non-Medical): No  Physical Activity: Inactive (07/28/2023)   Exercise Vital Sign    Days of Exercise per Week: 0 days    Minutes of Exercise per Session: 0 min  Stress: Stress Concern Present (07/28/2023)   [employer] of Occupational Health - Occupational Stress Questionnaire    Feeling of Stress : To some extent  Social Connections: Socially Isolated (07/28/2023)   Social Connection and Isolation Panel [NHANES]    Frequency of Communication with Friends and Family: Once a week    Frequency of Social Gatherings with Friends and Family: Once a week    Attends Religious Services: Never    Database administrator or Organizations: Yes    Attends Banker Meetings: Never    Marital Status: Never married  Allergies  Allergen Reactions   Amitriptyline Hcl     Confusion   Baclofen    Cymbalta [Duloxetine Hcl]     confusion   Amlodipine Rash   Prozac [Fluoxetine Hcl] Rash    Outpatient Medications Prior to Visit  Medication Sig Dispense Refill   acetaminophen (TYLENOL) 325 MG tablet Take 1-2 tablets (325-650 mg total) by mouth every 4 (four) hours as needed for mild pain.     apixaban (ELIQUIS) 5 MG TABS tablet Take 1 tablet (5 mg total) by mouth 2 (two) times daily. 180 tablet 1   ascorbic acid (VITAMIN C) 1000 MG tablet Take 1 tablet (1,000 mg total) by mouth daily. 30 tablet 0   atorvastatin (LIPITOR) 80 MG tablet Take 1 tablet (80 mg total) by mouth daily. 90 tablet 1   clopidogrel (PLAVIX) 75 MG tablet TAKE ONE TABLET BY MOUTH ONCE DAILY 90 tablet 1   dapagliflozin propanediol (FARXIGA) 10 MG TABS tablet Take 1 tablet (10 mg total) by mouth daily before breakfast. 90 tablet 1   diclofenac Sodium (VOLTAREN) 1 % GEL Apply 2 g topically 4 (four) times daily as needed (Left hip pain). 200 g 0   docusate sodium (COLACE) 100 MG capsule Take 1 capsule (100 mg total) by mouth daily. (Patient taking differently: Take 100 mg by mouth daily as needed.) 10 capsule 0    furosemide (LASIX) 40 MG tablet Take 1 tablet (40 mg total) by mouth daily. 90 tablet 1   gabapentin (NEURONTIN) 300 MG capsule Take 1 capsule (300 mg total) by mouth 3 (three) times daily. 90 capsule 3   insulin isophane & regular human KwikPen (NOVOLIN 70/30 KWIKPEN) (70-30) 100 UNIT/ML KwikPen Inject 28 Units into the skin in the morning and at bedtime. 30 mL 6   Insulin Pen Needle (GNP ULTICARE PEN NEEDLES) 31G X 5 MM MISC USE TO INJECT INSULIN TWO TIMES DAILY 200 each 1   Lancets (ONETOUCH DELICA PLUS LANCET33G) MISC USE ONE LANCETS TO TEST BLOOD SUGAR THREE TIMES A DAY 100 each 11   magnesium oxide (MAG-OX) 400 MG tablet Take 0.5 tablets (200 mg total) by mouth at bedtime. 30 tablet 0   methocarbamol (ROBAXIN) 750 MG tablet Take 1 tablet (750 mg total) by mouth every 8 (eight) hours as needed for muscle spasms. 60 tablet 3   metoprolol succinate (TOPROL XL) 50 MG 24 hr tablet Take 1 tablet (50 mg total) by mouth daily. Take with or immediately following a meal. 90 tablet 1   naloxone (NARCAN) nasal spray 4 mg/0.1 mL SMARTSIG:Both Nares     nicotine (NICODERM CQ - DOSED IN MG/24 HR) 7 mg/24hr patch 7 mg patch daily x2 weeks and stop 28 patch 1   ONETOUCH ULTRA test strip USE ONE TEST STRIP TO TEST BLOOD SUGAR THREE TIMES DAILY 100 strip 11   pantoprazole (PROTONIX) 40 MG tablet TAKE ONE TABLET BY MOUTH ONCE DAILY 90 tablet 0   polyethylene glycol (MIRALAX / GLYCOLAX) 17 g packet Take 17 g by mouth daily. 14 each 0   traMADol (ULTRAM) 50 MG tablet Take 1 tablet (50 mg total) by mouth at bedtime as needed. 30 tablet 1   valsartan (DIOVAN) 320 MG tablet Take 1 tablet (320 mg total) by mouth daily. 30 tablet 3   Vitamin D, Ergocalciferol, (DRISDOL) 1.25 MG (50000 UNIT) CAPS capsule Take 1 capsule (50,000 Units total) by mouth every 7 (seven) days. 5 capsule 0   No facility-administered medications prior to visit.  ROS Review of Systems  Constitutional:  Negative for activity change and  appetite change.  HENT:  Positive for congestion. Negative for sinus pressure and sore throat.   Respiratory:  Negative for chest tightness, shortness of breath and wheezing.   Cardiovascular:  Negative for chest pain and palpitations.  Gastrointestinal:  Negative for abdominal distention, abdominal pain and constipation.  Genitourinary: Negative.   Musculoskeletal: Negative.   Psychiatric/Behavioral:  Positive for sleep disturbance. Negative for behavioral problems and dysphoric mood.    *** Objective:  BP (!) 171/91   Pulse 82   Ht 6\' 1"  (1.854 m)   SpO2 100%   BMI 23.75 kg/m      09/07/2023    1:47 PM 07/28/2023    3:10 PM 07/28/2023    2:28 PM  BP/Weight  Systolic BP 171 155 173  Diastolic BP 91 90 90      Physical Exam Constitutional:      Appearance: He is well-developed.  HENT:     Right Ear: Tympanic membrane normal.     Left Ear: Tympanic membrane normal.     Nose: Nose normal.     Mouth/Throat:     Mouth: Mucous membranes are moist.  Cardiovascular:     Rate and Rhythm: Normal rate.     Heart sounds: Normal heart sounds. No murmur heard. Pulmonary:     Effort: Pulmonary effort is normal.     Breath sounds: Normal breath sounds. No wheezing or rales.  Chest:     Chest wall: No tenderness.  Abdominal:     General: Bowel sounds are normal. There is no distension.     Palpations: Abdomen is soft. There is no mass.     Tenderness: There is no abdominal tenderness.  Musculoskeletal:        General: Normal range of motion.     Cervical back: Normal range of motion. No rigidity or tenderness.     Right lower leg: No edema.     Left lower leg: No edema.     Comments: Left BKA  Neurological:     Mental Status: He is alert and oriented to person, place, and time.  Psychiatric:        Mood and Affect: Mood normal.    ***    Latest Ref Rng & Units 07/28/2023    3:18 PM 09/26/2022    4:18 AM 09/25/2022    3:48 AM  CMP  Glucose 70 - 99 mg/dL 119  147  829    BUN 8 - 27 mg/dL 11  21  15    Creatinine 0.76 - 1.27 mg/dL 5.62  1.30  8.65   Sodium 134 - 144 mmol/L 134  134  134   Potassium 3.5 - 5.2 mmol/L 4.5  3.6  4.3   Chloride 96 - 106 mmol/L 95  99  99   CO2 20 - 29 mmol/L 20  26  25    Calcium 8.6 - 10.2 mg/dL 78.4  8.3  8.2   Total Protein 6.0 - 8.5 g/dL 8.0     Total Bilirubin 0.0 - 1.2 mg/dL 0.4     Alkaline Phos 44 - 121 IU/L 104     AST 0 - 40 IU/L 22     ALT 0 - 44 IU/L 24       Lipid Panel     Component Value Date/Time   CHOL 133 08/27/2022 1013   TRIG 90 08/27/2022 1013   HDL 50 08/27/2022 1013  CHOLHDL 2.7 08/27/2022 1013   CHOLHDL 4.9 06/05/2022 0355   VLDL 24 06/05/2022 0355   LDLCALC 66 08/27/2022 1013    CBC    Component Value Date/Time   WBC 9.7 07/28/2023 1518   WBC 5.3 09/26/2022 0418   RBC 5.05 07/28/2023 1518   RBC 2.94 (L) 09/26/2022 0418   HGB 12.9 (L) 07/28/2023 1518   HCT 39.8 07/28/2023 1518   PLT 257 07/28/2023 1518   MCV 79 07/28/2023 1518   MCH 25.5 (L) 07/28/2023 1518   MCH 26.5 09/26/2022 0418   MCHC 32.4 07/28/2023 1518   MCHC 33.1 09/26/2022 0418   RDW 15.7 (H) 07/28/2023 1518   LYMPHSABS 2.0 07/28/2023 1518   MONOABS 0.4 08/04/2022 0508   EOSABS 0.2 07/28/2023 1518   BASOSABS 0.1 07/28/2023 1518    Lab Results  Component Value Date   HGBA1C 8.7 (A) 07/28/2023    Assessment & Plan:  Assessment and Plan               No orders of the defined types were placed in this encounter.   Follow-up: No follow-ups on file.       Hoy Register, MD, FAAFP. [facility] [place], [place] [phone]   09/07/2023, 1:47 PM

## 2023-09-08 ENCOUNTER — Encounter: Payer: Self-pay | Admitting: Family Medicine

## 2023-09-08 ENCOUNTER — Telehealth: Payer: Self-pay

## 2023-09-08 MED ORDER — NICOTINE 7 MG/24HR TD PT24: MEDICATED_PATCH | TRANSDERMAL | 2 refills | Status: AC

## 2023-09-08 NOTE — Telephone Encounter (Signed)
Done

## 2023-09-08 NOTE — Telephone Encounter (Signed)
[employer] is needing office note to be updated with this information in order for patient to get Knee Walker:   Notes stating why patient needs a knee walker/scooter -and f5f notes has to include statement -  "patient cannot safely use crutch/walker"

## 2023-09-09 ENCOUNTER — Telehealth: Payer: Self-pay

## 2023-09-09 NOTE — Telephone Encounter (Signed)
Insurance does not accept "Z" codes- Please add all qualifying diagnoses for this order  This is the message that I received online for his knee scooter.

## 2023-09-09 NOTE — Telephone Encounter (Signed)
Left BKA has a Z code.  The only other diagnosis that can be used is his phantom pain.  Thanks.

## 2023-10-27 ENCOUNTER — Other Ambulatory Visit: Payer: Self-pay

## 2023-10-28 ENCOUNTER — Other Ambulatory Visit: Payer: Self-pay

## 2023-12-08 ENCOUNTER — Encounter: Payer: Self-pay | Admitting: Family Medicine

## 2023-12-08 ENCOUNTER — Ambulatory Visit: Payer: 59 | Attending: Family Medicine | Admitting: Family Medicine

## 2023-12-08 VITALS — BP 139/76 | HR 63 | Ht 73.0 in

## 2023-12-08 DIAGNOSIS — R12 Heartburn: Secondary | ICD-10-CM

## 2023-12-08 DIAGNOSIS — I480 Paroxysmal atrial fibrillation: Secondary | ICD-10-CM

## 2023-12-08 DIAGNOSIS — I10 Essential (primary) hypertension: Secondary | ICD-10-CM | POA: Diagnosis not present

## 2023-12-08 DIAGNOSIS — L089 Local infection of the skin and subcutaneous tissue, unspecified: Secondary | ICD-10-CM | POA: Diagnosis not present

## 2023-12-08 DIAGNOSIS — Z89512 Acquired absence of left leg below knee: Secondary | ICD-10-CM | POA: Diagnosis not present

## 2023-12-08 DIAGNOSIS — G548 Other nerve root and plexus disorders: Secondary | ICD-10-CM

## 2023-12-08 DIAGNOSIS — I2510 Atherosclerotic heart disease of native coronary artery without angina pectoris: Secondary | ICD-10-CM | POA: Diagnosis not present

## 2023-12-08 DIAGNOSIS — Z720 Tobacco use: Secondary | ICD-10-CM

## 2023-12-08 DIAGNOSIS — Z9861 Coronary angioplasty status: Secondary | ICD-10-CM

## 2023-12-08 DIAGNOSIS — R52 Pain, unspecified: Secondary | ICD-10-CM

## 2023-12-08 DIAGNOSIS — F17290 Nicotine dependence, other tobacco product, uncomplicated: Secondary | ICD-10-CM

## 2023-12-08 DIAGNOSIS — Z1211 Encounter for screening for malignant neoplasm of colon: Secondary | ICD-10-CM

## 2023-12-08 DIAGNOSIS — E11628 Type 2 diabetes mellitus with other skin complications: Secondary | ICD-10-CM

## 2023-12-08 DIAGNOSIS — G894 Chronic pain syndrome: Secondary | ICD-10-CM

## 2023-12-08 LAB — POCT GLYCOSYLATED HEMOGLOBIN (HGB A1C): HbA1c, POC (controlled diabetic range): 6.7 % (ref 0.0–7.0)

## 2023-12-08 MED ORDER — DAPAGLIFLOZIN PROPANEDIOL 10 MG PO TABS
10.0000 mg | ORAL_TABLET | Freq: Every day | ORAL | 1 refills | Status: AC
Start: 2023-12-08 — End: ?

## 2023-12-08 MED ORDER — ATORVASTATIN CALCIUM 80 MG PO TABS: 80.0000 mg | ORAL_TABLET | Freq: Every day | ORAL | 1 refills | Status: AC

## 2023-12-08 MED ORDER — METOPROLOL SUCCINATE ER 50 MG PO TB24: 50.0000 mg | ORAL_TABLET | Freq: Every day | ORAL | 1 refills | Status: AC

## 2023-12-08 MED ORDER — VALSARTAN 320 MG PO TABS: 320.0000 mg | ORAL_TABLET | Freq: Every day | ORAL | 1 refills | Status: DC

## 2023-12-08 MED ORDER — METHOCARBAMOL 750 MG PO TABS: 750.0000 mg | ORAL_TABLET | Freq: Three times a day (TID) | ORAL | 3 refills | Status: AC | PRN

## 2023-12-08 MED ORDER — TRAMADOL HCL 50 MG PO TABS
50.0000 mg | ORAL_TABLET | Freq: Every evening | ORAL | 1 refills | Status: DC | PRN
Start: 2023-12-08 — End: 2024-03-08

## 2023-12-08 MED ORDER — PANTOPRAZOLE SODIUM 40 MG PO TBEC: 40.0000 mg | DELAYED_RELEASE_TABLET | Freq: Every day | ORAL | 1 refills | Status: AC

## 2023-12-08 MED ORDER — NOVOLIN 70/30 FLEXPEN (70-30) 100 UNIT/ML ~~LOC~~ SUPN: 8.0000 [IU] | PEN_INJECTOR | Freq: Two times a day (BID) | SUBCUTANEOUS | 6 refills | Status: AC

## 2023-12-08 MED ORDER — APIXABAN 5 MG PO TABS: 5.0000 mg | ORAL_TABLET | Freq: Two times a day (BID) | ORAL | 1 refills | Status: AC

## 2023-12-08 MED ORDER — FUROSEMIDE 40 MG PO TABS: 40.0000 mg | ORAL_TABLET | Freq: Every day | ORAL | 1 refills | Status: AC

## 2023-12-08 MED ORDER — PREGABALIN 100 MG PO CAPS: 100.0000 mg | ORAL_CAPSULE | Freq: Two times a day (BID) | ORAL | 4 refills | Status: DC

## 2023-12-08 NOTE — Patient Instructions (Signed)
VISIT SUMMARY:  During today's visit, we discussed your ongoing health issues, including chronic pain, nasal congestion, and your management of diabetes, hypertension, and atrial fibrillation. We reviewed your current medications and made some adjustments to better control your symptoms. Your blood pressure and A1c levels are well controlled, and we discussed strategies to help you quit smoking.  YOUR PLAN:  -PHANTOM LIMB PAIN: Phantom limb pain is pain that feels like it's coming from a body part that's no longer there. We will discontinue Gabapentin and start you on Pregabalin (Lyrica) 100mg  twice daily. If the pain persists, we may refer you to pain management.  -HYPERTENSION: Hypertension, or high blood pressure, is well controlled with your current medication. Continue your current regimen and remember to rest for 5 minutes and recheck if your initial reading is high.  -ATRIAL FIBRILLATION: Atrial fibrillation is an irregular and often rapid heart rate. You are currently on Eliquis and Clopidogrel following your stent placement in 2023. We will refer you to Cardiology to re-evaluate the need for continued Clopidogrel.  -TYPE 2 DIABETES MELLITUS: Type 2 diabetes is a condition that affects the way your body processes blood sugar. Your A1c has improved to 6.7%. We will reduce your insulin to 8 units twice daily and encourage you to adjust the dose based on your blood glucose readings.  -TOBACCO USE: You are making progress in quitting smoking by using nicotine patches. Continue your efforts to quit, and we can provide refills of nicotine patches as needed.  -NASAL CONGESTION: Nasal congestion is a stuffy nose. Since Flonase has not been effective, we will provide you with a nasal lavage kit to try.  INSTRUCTIONS:  Please follow up with Cardiology to re-evaluate the need for continued Clopidogrel. Continue to monitor your blood pressure at home and report if it is consistently high. Adjust your  insulin dose based on your blood glucose readings and use the nasal lavage kit as directed. If your phantom limb pain persists, we may need to refer you to pain management.

## 2023-12-08 NOTE — Progress Notes (Signed)
Subjective:  Patient ID: Robert Burnett, male    DOB: 1961/06/06  Age: 63 y.o. MRN: [id number]  CC: Medical Management of Chronic Issues (Allergies/Discuss pain medication increase)   HPI Robert Burnett is a 63 y.o. 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  He presents with chronic phantom pain that is not well controlled with gabapentin and tramadol. The pain is particularly severe in the area of the amputation, causing restlessness and difficulty sleeping. The patient also reports nasal congestion that is not relieved with Flonase. The patient is a former smoker and is currently using nicotine patches to manage cravings.  Despite these health issues, the patient's blood pressure and A1c levels are well controlled.  The patient also has a history of heart disease, including a stent placement in 2023 and has been on Plavix since 05/2022.  Of note he is also on Eliquis for his atrial fibrillation.  He has not been to see cardiology since 08/2022.       Past Medical History:  Diagnosis Date   Allergy    Anemia    Anemia    Arthritis    ARTHRITIS IN SPINE - MAKES PT HAVE CHEST PAIN- USES fENTANYL PATCH    Asthma    IN TEENS    Bacteremia    a. 03/2022 Group B Strep bacteremia in setting of diabetic L foot infxn/gangrene/osteo.   CAD (coronary artery disease)    Chronic HFrEF (heart failure with reduced ejection fraction) (HCC)    Depression    Diabetes mellitus    Diabetic peripheral neuropathy associated with type 2 diabetes mellitus  (HCC) 12/12/2011   Fatty liver disease, nonalcoholic 10/27/2016   Foot osteomyelitis, left (HCC)    a. 2012 s/p toe amputations on L; b. 03/2022 - admitted w/ wet gangrene and necrotizing infxn w/ osteomyelitis-->s/p transmetatarsal followed by Lisfranc amputation.   GERD (gastroesophageal reflux disease)    H/O degenerative disc disease    History of blood transfusion    History of echocardiogram    a. 03/2022 Echo: EF 55-60%, no rwma, nl RV fxn, mildly elev PASP. Mild MR.   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[facility])     Past Surgical History:  Procedure Laterality Date   ACHILLES TENDON SURGERY Left 03/28/2022   Procedure: ACHILLES LENGTHENING/KIDNER;  Surgeon: Edwin Cap, DPM;  Location: [facility];  Service: Podiatry;  Laterality: Left;   AMPUTATION Left 03/25/2022   Procedure: AMPUTATION DIGITS,TRANSMETARSAL AMPUTATION, REMOVAL OF TOES AND BONE BEHIND TOES ;  Surgeon: Edwin Cap, DPM;  Location: [facility];  Service: Podiatry;  Laterality: Left;   AMPUTATION Left 07/23/2022   Procedure: LEFT BELOW KNEE AMPUTATION;  Surgeon: Nadara Mustard, MD;  Location: [facility] OR;  Service: Orthopedics;  Laterality: Left;   APPLICATION OF WOUND VAC Left 03/25/2022   Procedure: APPLICATION OF WOUND VAC;  Surgeon: Edwin Cap, DPM;  Location: [facility];  Service: Podiatry;  Laterality: Left;   CORONARY  STENT INTERVENTION N/A 06/05/2022   Procedure: CORONARY STENT INTERVENTION;  Surgeon: Lyn Records, MD;  Location: [facility] INVASIVE CV LAB;  Service: Cardiovascular;  Laterality: N/A;   CYSTOSCOPY/RETROGRADE/URETEROSCOPY Left 10/29/2020   Procedure: CYSTOSCOPY/RETROGRADE/ LEFT URETEROSCOPY WITH BIOPSY, LEFT URETERAL STENT;  Surgeon: Heloise Purpura, MD;  Location: [facility];  Service: Urology;  Laterality: Left;   IRRIGATION AND DEBRIDEMENT FOOT Left 03/28/2022   Procedure: AMPUTATION LISFRANC, SECONDARY WOUND CLOSURE;  Surgeon: Edwin Cap, DPM;   Location: [facility];  Service: Podiatry;  Laterality: Left;   KNEE SURGERY Bilateral 1975 and 1989   LEFT HEART CATH AND CORONARY ANGIOGRAPHY N/A 06/05/2022   Procedure: LEFT HEART CATH AND CORONARY ANGIOGRAPHY;  Surgeon: Lyn Records, MD;  Location: [facility] INVASIVE CV LAB;  Service: Cardiovascular;  Laterality: N/A;   MULTIPLE TOOTH EXTRACTIONS     ROBOT ASSITED LAPAROSCOPIC NEPHROURETERECTOMY Left 12/13/2020   Procedure: XI ROBOT ASSITED LAPAROSCOPIC NEPHROURETERECTOMY , POST OPERATIVE INTRAVESICAL GEMCITABINE;  Surgeon: Heloise Purpura, MD;  Location: [facility];  Service: Urology;  Laterality: Left;  ONLY NEEDS 240 MIN   STUMP REVISION Left 09/24/2022   Procedure: REVISION LEFT BELOW KNEE AMPUTATION;  Surgeon: Nadara Mustard, MD;  Location: [facility] OR;  Service: Orthopedics;  Laterality: Left;   TOEM AMPUTATION  Left    2012, second toe   TRANSURETHRAL RESECTION OF BLADDER TUMOR N/A 08/29/2021   Procedure: TRANSURETHRAL RESECTION OF BLADDER TUMOR (TURBT) WITH CYSTOSCOPY;  Surgeon: Heloise Purpura, MD;  Location: [facility];  Service: Urology;  Laterality: N/A;  GENERAL ANESTHESIA WITH PARALYSIS   WISDOM TOOTH EXTRACTION      Family History  Problem Relation Age of Onset   Cancer Mother    Hypertension Father    Diabetes Father    Lung cancer Father    Diabetes Sister    Cancer Sister     Social History   Socioeconomic History   Marital status: Single    Spouse name: Not on file   Number of children: Not on file   Years of education: Not on file   Highest education level: Not on file  Occupational History   Not on file  Tobacco Use   Smoking status: Every Day    Types: Pipe   Smokeless tobacco: Former    Types: Chew   Tobacco comments:    Using pipe until about a week ago (05/2022)    Quit cigarettes 2003ish  Vaping Use   Vaping status: Never Used  Substance and Sexual Activity   Alcohol use: No   Drug use: No   Sexual activity: Not on file  Other Topics Concern   Not on file  Social  History Narrative   Lives in [place] by himself.  Sedentary in the setting of chronic arthritic pain impacting his chest/back along w/ L foot amputation.   Social Drivers of Health   Financial Resource Strain: Medium Risk (07/28/2023)   Overall Financial Resource Strain (CARDIA)    Difficulty of Paying Living Expenses: Somewhat hard  Food Insecurity: No Food Insecurity (07/28/2023)   Hunger Vital Sign    Worried About Running Out of Food in the Last Year: Never true    Ran Out of Food in the Last Year: Never true  Transportation Needs: No Transportation Needs (07/28/2023)   PRAPARE - Administrator, Civil Service (Medical): No    Lack of Transportation (Non-Medical): No  Physical Activity: Inactive (07/28/2023)   Exercise Vital Sign    Days of Exercise per  Week: 0 days    Minutes of Exercise per Session: 0 min  Stress: Stress Concern Present (07/28/2023)   [employer] of Occupational Health - Occupational Stress Questionnaire    Feeling of Stress : To some extent  Social Connections: Socially Isolated (07/28/2023)   Social Connection and Isolation Panel [NHANES]    Frequency of Communication with Friends and Family: Once a week    Frequency of Social Gatherings with Friends and Family: Once a week    Attends Religious Services: Never    Database administrator or Organizations: Yes    Attends Banker Meetings: Never    Marital Status: Never married    Allergies  Allergen Reactions   Amitriptyline Hcl     Confusion   Baclofen    Cymbalta [Duloxetine Hcl]     confusion   Amlodipine Rash   Prozac [Fluoxetine Hcl] Rash    Outpatient Medications Prior to Visit  Medication Sig Dispense Refill   acetaminophen (TYLENOL) 325 MG tablet Take 1-2 tablets (325-650 mg total) by mouth every 4 (four) hours as needed for mild pain.     ascorbic acid (VITAMIN C) 1000 MG tablet Take 1 tablet (1,000 mg total) by mouth daily. 30 tablet 0   clopidogrel (PLAVIX) 75 MG  tablet TAKE ONE TABLET BY MOUTH ONCE DAILY 90 tablet 1   diclofenac Sodium (VOLTAREN) 1 % GEL Apply 2 g topically 4 (four) times daily as needed (Left hip pain). 200 g 0   docusate sodium (COLACE) 100 MG capsule Take 1 capsule (100 mg total) by mouth daily. (Patient taking differently: Take 100 mg by mouth daily as needed.) 10 capsule 0   fluticasone (FLONASE) 50 MCG/ACT nasal spray Place 2 sprays into both nostrils daily. 16 g 6   Insulin Pen Needle (GNP ULTICARE PEN NEEDLES) 31G X 5 MM MISC USE TO INJECT INSULIN TWO TIMES DAILY 200 each 1   Lancets (ONETOUCH DELICA PLUS LANCET33G) MISC USE ONE LANCETS TO TEST BLOOD SUGAR THREE TIMES A DAY 100 each 11   loratadine (CLARITIN) 10 MG tablet Take 1 tablet (10 mg total) by mouth daily. 30 tablet 6   Misc. Devices MISC Knee walker.  Diagnosis -  left BKA 1 each 0   naloxone (NARCAN) nasal spray 4 mg/0.1 mL SMARTSIG:Both Nares     nicotine (NICODERM CQ - DOSED IN MG/24 HR) 7 mg/24hr patch 7 mg patch daily x2 weeks and stop 28 patch 2   ONETOUCH ULTRA test strip USE ONE TEST STRIP TO TEST BLOOD SUGAR THREE TIMES DAILY 100 strip 11   polyethylene glycol (MIRALAX / GLYCOLAX) 17 g packet Take 17 g by mouth daily. 14 each 0   apixaban (ELIQUIS) 5 MG TABS tablet Take 1 tablet (5 mg total) by mouth 2 (two) times daily. 180 tablet 1   atorvastatin (LIPITOR) 80 MG tablet Take 1 tablet (80 mg total) by mouth daily. 90 tablet 1   dapagliflozin propanediol (FARXIGA) 10 MG TABS tablet Take 1 tablet (10 mg total) by mouth daily before breakfast. 90 tablet 1   furosemide (LASIX) 40 MG tablet Take 1 tablet (40 mg total) by mouth daily. 90 tablet 1   gabapentin (NEURONTIN) 300 MG capsule Take 2 capsules (600 mg total) by mouth 3 (three) times daily. 180 capsule 3   insulin isophane & regular human KwikPen (NOVOLIN 70/30 KWIKPEN) (70-30) 100 UNIT/ML KwikPen Inject 28 Units into the skin in the morning and at bedtime. 30 mL 6  methocarbamol (ROBAXIN) 750 MG tablet Take 1  tablet (750 mg total) by mouth every 8 (eight) hours as needed for muscle spasms. 60 tablet 3   metoprolol succinate (TOPROL XL) 50 MG 24 hr tablet Take 1 tablet (50 mg total) by mouth daily. Take with or immediately following a meal. 90 tablet 1   pantoprazole (PROTONIX) 40 MG tablet TAKE ONE TABLET BY MOUTH ONCE DAILY 90 tablet 0   traMADol (ULTRAM) 50 MG tablet Take 1 tablet (50 mg total) by mouth at bedtime as needed. 30 tablet 1   valsartan (DIOVAN) 320 MG tablet Take 1 tablet (320 mg total) by mouth daily. 30 tablet 3   No facility-administered medications prior to visit.     ROS Review of Systems  Constitutional:  Negative for activity change and appetite change.  HENT:  Negative for sinus pressure and sore throat.   Respiratory:  Negative for chest tightness, shortness of breath and wheezing.   Cardiovascular:  Negative for chest pain and palpitations.  Gastrointestinal:  Negative for abdominal distention, abdominal pain and constipation.  Genitourinary: Negative.   Musculoskeletal:        See HPI  Psychiatric/Behavioral:  Negative for behavioral problems and dysphoric mood.     Objective:  BP 139/76   Pulse 63   Ht 6\' 1"  (1.854 m)   SpO2 98%   BMI 23.75 kg/m      12/08/2023    2:38 PM 09/07/2023    2:20 PM 09/07/2023    1:47 PM  BP/Weight  Systolic BP 139 168 171  Diastolic BP 76 85 91      Physical Exam Constitutional:      Appearance: He is well-developed.  Cardiovascular:     Rate and Rhythm: Normal rate.     Heart sounds: Normal heart sounds. No murmur heard. Pulmonary:     Effort: Pulmonary effort is normal.     Breath sounds: Normal breath sounds. No wheezing or rales.  Chest:     Chest wall: No tenderness.  Abdominal:     General: Bowel sounds are normal. There is no distension.     Palpations: Abdomen is soft. There is no mass.     Tenderness: There is no abdominal tenderness.  Musculoskeletal:        General: Normal range of motion.      Right lower leg: No edema.     Left lower leg: No edema.     Comments: Left BKA  Neurological:     Mental Status: He is alert and oriented to person, place, and time.  Psychiatric:        Mood and Affect: Mood normal.        Latest Ref Rng & Units 07/28/2023    3:18 PM 09/26/2022    4:18 AM 09/25/2022    3:48 AM  CMP  Glucose 70 - 99 mg/dL 161  096  045   BUN 8 - 27 mg/dL 11  21  15    Creatinine 0.76 - 1.27 mg/dL 4.09  8.11  9.14   Sodium 134 - 144 mmol/L 134  134  134   Potassium 3.5 - 5.2 mmol/L 4.5  3.6  4.3   Chloride 96 - 106 mmol/L 95  99  99   CO2 20 - 29 mmol/L 20  26  25    Calcium 8.6 - 10.2 mg/dL 78.2  8.3  8.2   Total Protein 6.0 - 8.5 g/dL 8.0     Total Bilirubin 0.0 -  1.2 mg/dL 0.4     Alkaline Phos 44 - 121 IU/L 104     AST 0 - 40 IU/L 22     ALT 0 - 44 IU/L 24       Lipid Panel     Component Value Date/Time   CHOL 133 08/27/2022 1013   TRIG 90 08/27/2022 1013   HDL 50 08/27/2022 1013   CHOLHDL 2.7 08/27/2022 1013   CHOLHDL 4.9 06/05/2022 0355   VLDL 24 06/05/2022 0355   LDLCALC 66 08/27/2022 1013    CBC    Component Value Date/Time   WBC 9.7 07/28/2023 1518   WBC 5.3 09/26/2022 0418   RBC 5.05 07/28/2023 1518   RBC 2.94 (L) 09/26/2022 0418   HGB 12.9 (L) 07/28/2023 1518   HCT 39.8 07/28/2023 1518   PLT 257 07/28/2023 1518   MCV 79 07/28/2023 1518   MCH 25.5 (L) 07/28/2023 1518   MCH 26.5 09/26/2022 0418   MCHC 32.4 07/28/2023 1518   MCHC 33.1 09/26/2022 0418   RDW 15.7 (H) 07/28/2023 1518   LYMPHSABS 2.0 07/28/2023 1518   MONOABS 0.4 08/04/2022 0508   EOSABS 0.2 07/28/2023 1518   BASOSABS 0.1 07/28/2023 1518    Lab Results  Component Value Date   HGBA1C 6.7 12/08/2023    Assessment & Plan:      Phantom Limb Pain/left BKA/chronic pain Uncontrolled pain despite Gabapentin 600mg . Patient unable to tolerate Cymbalta due to gastrointestinal side effects. Prior trial of Pregabalin (Lyrica) years ago. -Discontinue Gabapentin. -Start  Pregabalin (Lyrica) 100mg  twice daily. -Refilled tramadol -Consider referral to pain management if pain persists.  Hypertension Blood pressure well controlled on current regimen. Patient self-monitoring at home with variable readings. -Continue current antihypertensive regimen. -Advise patient to rest for 5 minutes and recheck if initial reading is high.  Atrial Fibrillation On Eliquis and metoprolol  -Currently in sinus rhythm   CAD He has been on clopidogrel following stent placement in 2023. Unclear if Clopidogrel is still necessary as he would have completed dual antiplatelet therapy in 05/2023 and there is an increased risk of bleeding with both clopidogrel and Eliquis. -Refer to Cardiology for re-evaluation of need for continued Clopidogrel.  Type 2 Diabetes Mellitus Improved glycemic control with A1c of 6.7%, down from 8.7%. Variable insulin use due to decreased appetite and difficulty cooking. -Reduce insulin to 8 units twice daily, with instructions to adjust dose based on blood glucose readings. -Encourage consistent insulin use rather than skipping as this week he has completely skipped his insulin.  Tobacco Use Patient reports cutting back on pipe smoking and using nicotine patches intermittently. -Encourage continued efforts to quit smoking. -Offer refills of nicotine patches as needed.  Nasal Congestion Poor relief with Flonase. -Provide nasal lavage kit for trial.  GERD -Currently on PPI  General Health Maintenance -Continue Eliquis for atrial fibrillation. -Continue current blood pressure regimen. -Check blood pressure at home and report if consistently high. -Encourage consistent use of insulin and adjustment of dose based on blood glucose readings. -Encourage continued efforts to quit smoking and offer nicotine patch refills as needed. -Trial of nasal lavage kit for nasal congestion.          Meds ordered this encounter  Medications   pregabalin  (LYRICA) 100 MG capsule    Sig: Take 1 capsule (100 mg total) by mouth 2 (two) times daily.    Dispense:  60 capsule    Refill:  4    Discontinue gabapentin   apixaban (ELIQUIS)  5 MG TABS tablet    Sig: Take 1 tablet (5 mg total) by mouth 2 (two) times daily.    Dispense:  180 tablet    Refill:  1   atorvastatin (LIPITOR) 80 MG tablet    Sig: Take 1 tablet (80 mg total) by mouth daily.    Dispense:  90 tablet    Refill:  1   dapagliflozin propanediol (FARXIGA) 10 MG TABS tablet    Sig: Take 1 tablet (10 mg total) by mouth daily before breakfast.    Dispense:  90 tablet    Refill:  1   furosemide (LASIX) 40 MG tablet    Sig: Take 1 tablet (40 mg total) by mouth daily.    Dispense:  90 tablet    Refill:  1   insulin isophane & regular human KwikPen (NOVOLIN 70/30 KWIKPEN) (70-30) 100 UNIT/ML KwikPen    Sig: Inject 8 Units into the skin in the morning and at bedtime. Dose decrease    Dispense:  30 mL    Refill:  6   methocarbamol (ROBAXIN) 750 MG tablet    Sig: Take 1 tablet (750 mg total) by mouth every 8 (eight) hours as needed for muscle spasms.    Dispense:  60 tablet    Refill:  3   metoprolol succinate (TOPROL XL) 50 MG 24 hr tablet    Sig: Take 1 tablet (50 mg total) by mouth daily. Take with or immediately following a meal.    Dispense:  90 tablet    Refill:  1   pantoprazole (PROTONIX) 40 MG tablet    Sig: Take 1 tablet (40 mg total) by mouth daily.    Dispense:  90 tablet    Refill:  1   valsartan (DIOVAN) 320 MG tablet    Sig: Take 1 tablet (320 mg total) by mouth daily.    Dispense:  90 tablet    Refill:  1   traMADol (ULTRAM) 50 MG tablet    Sig: Take 1 tablet (50 mg total) by mouth at bedtime as needed.    Dispense:  30 tablet    Refill:  1    Follow-up: Return in about 3 months (around 03/06/2024) for Chronic medical conditions.       Hoy Register, MD, FAAFP. [facility] [place], [place] [phone]   12/08/2023,  5:41 PM

## 2023-12-09 LAB — MICROALBUMIN / CREATININE URINE RATIO

## 2023-12-12 LAB — SPECIMEN STATUS REPORT

## 2023-12-12 LAB — MICROALBUMIN / CREATININE URINE RATIO

## 2023-12-12 LAB — CMP14+EGFR
ALT: 10 [IU]/L (ref 0–44)
AST: 18 [IU]/L (ref 0–40)
Albumin: 4.2 g/dL (ref 3.9–4.9)
Alkaline Phosphatase: 108 [IU]/L (ref 44–121)
BUN/Creatinine Ratio: 12 (ref 10–24)
BUN: 16 mg/dL (ref 8–27)
Bilirubin Total: 0.3 mg/dL (ref 0.0–1.2)
CO2: 25 mmol/L (ref 20–29)
Calcium: 9.7 mg/dL (ref 8.6–10.2)
Chloride: 94 mmol/L — ABNORMAL LOW (ref 96–106)
Creatinine, Ser: 1.36 mg/dL — ABNORMAL HIGH (ref 0.76–1.27)
Globulin, Total: 3 g/dL (ref 1.5–4.5)
Glucose: 129 mg/dL — ABNORMAL HIGH (ref 70–99)
Potassium: 4 mmol/L (ref 3.5–5.2)
Sodium: 135 mmol/L (ref 134–144)
Total Protein: 7.2 g/dL (ref 6.0–8.5)
eGFR: 58 mL/min/{1.73_m2} — ABNORMAL LOW (ref >59)

## 2023-12-14 ENCOUNTER — Encounter: Payer: Self-pay | Admitting: Family Medicine

## 2024-03-07 ENCOUNTER — Inpatient Hospital Stay (HOSPITAL_COMMUNITY)
Admission: EM | Admit: 2024-03-07 | Discharge: 2024-03-15 | DRG: 662 | Disposition: A | Discharge status: Home / Self Care | Attending: Family Medicine | Admitting: Family Medicine

## 2024-03-07 ENCOUNTER — Ambulatory Visit: Payer: 59 | Admitting: Family Medicine

## 2024-03-07 ENCOUNTER — Other Ambulatory Visit: Payer: Self-pay

## 2024-03-07 ENCOUNTER — Emergency Department (HOSPITAL_COMMUNITY)

## 2024-03-07 ENCOUNTER — Encounter (HOSPITAL_COMMUNITY): Payer: Self-pay

## 2024-03-07 DIAGNOSIS — I2510 Atherosclerotic heart disease of native coronary artery without angina pectoris: Secondary | ICD-10-CM

## 2024-03-07 DIAGNOSIS — R319 Hematuria, unspecified: Secondary | ICD-10-CM

## 2024-03-07 DIAGNOSIS — I110 Hypertensive heart disease with heart failure: Secondary | ICD-10-CM | POA: Diagnosis present

## 2024-03-07 DIAGNOSIS — G8929 Other chronic pain: Secondary | ICD-10-CM | POA: Diagnosis present

## 2024-03-07 DIAGNOSIS — Z7901 Long term (current) use of anticoagulants: Secondary | ICD-10-CM

## 2024-03-07 DIAGNOSIS — R0789 Other chest pain: Secondary | ICD-10-CM | POA: Diagnosis present

## 2024-03-07 DIAGNOSIS — I255 Ischemic cardiomyopathy: Secondary | ICD-10-CM | POA: Diagnosis present

## 2024-03-07 DIAGNOSIS — F17210 Nicotine dependence, cigarettes, uncomplicated: Secondary | ICD-10-CM | POA: Diagnosis present

## 2024-03-07 DIAGNOSIS — Z801 Family history of malignant neoplasm of trachea, bronchus and lung: Secondary | ICD-10-CM

## 2024-03-07 DIAGNOSIS — R9431 Abnormal electrocardiogram [ECG] [EKG]: Secondary | ICD-10-CM | POA: Diagnosis not present

## 2024-03-07 DIAGNOSIS — I480 Paroxysmal atrial fibrillation: Secondary | ICD-10-CM | POA: Diagnosis present

## 2024-03-07 DIAGNOSIS — I5042 Chronic combined systolic (congestive) and diastolic (congestive) heart failure: Secondary | ICD-10-CM | POA: Diagnosis present

## 2024-03-07 DIAGNOSIS — N401 Enlarged prostate with lower urinary tract symptoms: Secondary | ICD-10-CM | POA: Diagnosis present

## 2024-03-07 DIAGNOSIS — Z955 Presence of coronary angioplasty implant and graft: Secondary | ICD-10-CM

## 2024-03-07 DIAGNOSIS — Z881 Allergy status to other antibiotic agents status: Secondary | ICD-10-CM

## 2024-03-07 DIAGNOSIS — N138 Other obstructive and reflux uropathy: Secondary | ICD-10-CM | POA: Diagnosis present

## 2024-03-07 DIAGNOSIS — T501X5A Adverse effect of loop [high-ceiling] diuretics, initial encounter: Secondary | ICD-10-CM | POA: Diagnosis present

## 2024-03-07 DIAGNOSIS — Z886 Allergy status to analgesic agent status: Secondary | ICD-10-CM

## 2024-03-07 DIAGNOSIS — E876 Hypokalemia: Secondary | ICD-10-CM | POA: Diagnosis present

## 2024-03-07 DIAGNOSIS — E785 Hyperlipidemia, unspecified: Secondary | ICD-10-CM | POA: Diagnosis present

## 2024-03-07 DIAGNOSIS — Z8554 Personal history of malignant neoplasm of ureter: Secondary | ICD-10-CM

## 2024-03-07 DIAGNOSIS — D500 Iron deficiency anemia secondary to blood loss (chronic): Secondary | ICD-10-CM | POA: Diagnosis present

## 2024-03-07 DIAGNOSIS — C671 Malignant neoplasm of dome of bladder: Principal | ICD-10-CM | POA: Diagnosis present

## 2024-03-07 DIAGNOSIS — C679 Malignant neoplasm of bladder, unspecified: Secondary | ICD-10-CM

## 2024-03-07 DIAGNOSIS — J45909 Unspecified asthma, uncomplicated: Secondary | ICD-10-CM | POA: Diagnosis present

## 2024-03-07 DIAGNOSIS — Z720 Tobacco use: Secondary | ICD-10-CM | POA: Diagnosis present

## 2024-03-07 DIAGNOSIS — N179 Acute kidney failure, unspecified: Secondary | ICD-10-CM | POA: Diagnosis present

## 2024-03-07 DIAGNOSIS — D649 Anemia, unspecified: Secondary | ICD-10-CM | POA: Diagnosis present

## 2024-03-07 DIAGNOSIS — E1142 Type 2 diabetes mellitus with diabetic polyneuropathy: Secondary | ICD-10-CM | POA: Diagnosis present

## 2024-03-07 DIAGNOSIS — C674 Malignant neoplasm of posterior wall of bladder: Secondary | ICD-10-CM | POA: Diagnosis not present

## 2024-03-07 DIAGNOSIS — Z681 Body mass index (BMI) 19 or less, adult: Secondary | ICD-10-CM | POA: Diagnosis not present

## 2024-03-07 DIAGNOSIS — Z79899 Other long term (current) drug therapy: Secondary | ICD-10-CM

## 2024-03-07 DIAGNOSIS — N3289 Other specified disorders of bladder: Secondary | ICD-10-CM | POA: Diagnosis present

## 2024-03-07 DIAGNOSIS — R310 Gross hematuria: Secondary | ICD-10-CM | POA: Insufficient documentation

## 2024-03-07 DIAGNOSIS — E43 Unspecified severe protein-calorie malnutrition: Secondary | ICD-10-CM | POA: Diagnosis present

## 2024-03-07 DIAGNOSIS — Z833 Family history of diabetes mellitus: Secondary | ICD-10-CM

## 2024-03-07 DIAGNOSIS — I252 Old myocardial infarction: Secondary | ICD-10-CM

## 2024-03-07 DIAGNOSIS — D62 Acute posthemorrhagic anemia: Secondary | ICD-10-CM | POA: Diagnosis present

## 2024-03-07 DIAGNOSIS — F419 Anxiety disorder, unspecified: Secondary | ICD-10-CM | POA: Diagnosis present

## 2024-03-07 DIAGNOSIS — E222 Syndrome of inappropriate secretion of antidiuretic hormone: Secondary | ICD-10-CM | POA: Diagnosis present

## 2024-03-07 DIAGNOSIS — Z7409 Other reduced mobility: Secondary | ICD-10-CM | POA: Diagnosis present

## 2024-03-07 DIAGNOSIS — I10 Essential (primary) hypertension: Secondary | ICD-10-CM | POA: Diagnosis not present

## 2024-03-07 DIAGNOSIS — Z888 Allergy status to other drugs, medicaments and biological substances status: Secondary | ICD-10-CM

## 2024-03-07 DIAGNOSIS — Z7902 Long term (current) use of antithrombotics/antiplatelets: Secondary | ICD-10-CM

## 2024-03-07 DIAGNOSIS — Z8249 Family history of ischemic heart disease and other diseases of the circulatory system: Secondary | ICD-10-CM

## 2024-03-07 DIAGNOSIS — D61818 Other pancytopenia: Secondary | ICD-10-CM | POA: Diagnosis present

## 2024-03-07 DIAGNOSIS — Z89512 Acquired absence of left leg below knee: Secondary | ICD-10-CM

## 2024-03-07 DIAGNOSIS — R338 Other retention of urine: Secondary | ICD-10-CM | POA: Diagnosis present

## 2024-03-07 DIAGNOSIS — Z905 Acquired absence of kidney: Secondary | ICD-10-CM

## 2024-03-07 LAB — CBC WITH DIFFERENTIAL/PLATELET
Abs Immature Granulocytes: 0.01 10*3/uL (ref 0.00–0.07)
Basophils Absolute: 0 10*3/uL (ref 0.0–0.1)
Basophils Relative: 0 %
Eosinophils Absolute: 0 10*3/uL (ref 0.0–0.5)
Eosinophils Relative: 0 %
HCT: 14.7 % — ABNORMAL LOW (ref 39.0–52.0)
Hemoglobin: 4.6 g/dL — CL (ref 13.0–17.0)
Immature Granulocytes: 0 %
Lymphocytes Relative: 23 %
Lymphs Abs: 0.8 10*3/uL (ref 0.7–4.0)
MCH: 23.4 pg — ABNORMAL LOW (ref 26.0–34.0)
MCHC: 31.3 g/dL (ref 30.0–36.0)
MCV: 74.6 fL — ABNORMAL LOW (ref 80.0–100.0)
Monocytes Absolute: 0.3 10*3/uL (ref 0.1–1.0)
Monocytes Relative: 9 %
Neutro Abs: 2.5 10*3/uL (ref 1.7–7.7)
Neutrophils Relative %: 68 %
Platelets: 137 10*3/uL — ABNORMAL LOW (ref 150–400)
RBC: 1.97 MIL/uL — ABNORMAL LOW (ref 4.22–5.81)
RDW: 15.7 % — ABNORMAL HIGH (ref 11.5–15.5)
WBC: 3.7 10*3/uL — ABNORMAL LOW (ref 4.0–10.5)
nRBC: 0 % (ref 0.0–0.2)

## 2024-03-07 LAB — COMPREHENSIVE METABOLIC PANEL WITH GFR
ALT: 11 U/L (ref 0–44)
AST: 20 U/L (ref 15–41)
Albumin: 3.6 g/dL (ref 3.5–5.0)
Alkaline Phosphatase: 73 U/L (ref 38–126)
Anion gap: 10 (ref 5–15)
BUN: 20 mg/dL (ref 8–23)
CO2: 29 mmol/L (ref 22–32)
Calcium: 8.9 mg/dL (ref 8.9–10.3)
Chloride: 90 mmol/L — ABNORMAL LOW (ref 98–111)
Creatinine, Ser: 1.51 mg/dL — ABNORMAL HIGH (ref 0.61–1.24)
GFR, Estimated: 52 mL/min — ABNORMAL LOW (ref >60)
Glucose, Bld: 104 mg/dL — ABNORMAL HIGH (ref 70–99)
Potassium: 3.4 mmol/L — ABNORMAL LOW (ref 3.5–5.1)
Sodium: 129 mmol/L — ABNORMAL LOW (ref 135–145)
Total Bilirubin: 0.5 mg/dL (ref 0.0–1.2)
Total Protein: 6.8 g/dL (ref 6.5–8.1)

## 2024-03-07 LAB — GLUCOSE, CAPILLARY: Glucose-Capillary: 131 mg/dL — ABNORMAL HIGH (ref 70–99)

## 2024-03-07 LAB — URINALYSIS, ROUTINE W REFLEX MICROSCOPIC
Bilirubin Urine: NEGATIVE
Glucose, UA: NEGATIVE mg/dL
Nitrite: NEGATIVE

## 2024-03-07 LAB — URINALYSIS, MICROSCOPIC (REFLEX)
Bacteria, UA: NONE SEEN
RBC / HPF: >50 RBC/hpf (ref 0–5)
Squamous Epithelial / HPF: NONE SEEN /HPF (ref 0–5)

## 2024-03-07 LAB — POC OCCULT BLOOD, ED: Fecal Occult Bld: NEGATIVE

## 2024-03-07 LAB — TROPONIN I (HIGH SENSITIVITY)
Troponin I (High Sensitivity): 6 ng/L (ref <18)
Troponin I (High Sensitivity): 8 ng/L (ref <18)

## 2024-03-07 LAB — PREPARE RBC (CROSSMATCH)

## 2024-03-07 LAB — MAGNESIUM: Magnesium: 2 mg/dL (ref 1.7–2.4)

## 2024-03-07 MED ORDER — ACETAMINOPHEN 325 MG PO TABS: 650.0000 mg | ORAL_TABLET | Freq: Four times a day (QID) | ORAL | Status: DC | PRN

## 2024-03-07 MED ORDER — FENTANYL CITRATE PF 50 MCG/ML IJ SOSY
50.0000 ug | PREFILLED_SYRINGE | Freq: Once | INTRAMUSCULAR | Status: AC
  Administered 2024-03-07: 50 ug via INTRAVENOUS
  Filled 2024-03-07: qty 1

## 2024-03-07 MED ORDER — LORAZEPAM 2 MG/ML IJ SOLN
1.0000 mg | Freq: Once | INTRAMUSCULAR | Status: AC
  Administered 2024-03-07: 1 mg via INTRAVENOUS
  Filled 2024-03-07: qty 1

## 2024-03-07 MED ORDER — INSULIN ASPART 100 UNIT/ML IJ SOLN
0.0000 [IU] | Freq: Three times a day (TID) | INTRAMUSCULAR | Status: DC
  Administered 2024-03-08: 3 [IU] via SUBCUTANEOUS
  Administered 2024-03-08: 2 [IU] via SUBCUTANEOUS
  Administered 2024-03-09: 3 [IU] via SUBCUTANEOUS
  Administered 2024-03-09: 2 [IU] via SUBCUTANEOUS
  Administered 2024-03-09: 3 [IU] via SUBCUTANEOUS
  Administered 2024-03-10: 5 [IU] via SUBCUTANEOUS
  Administered 2024-03-10: 2 [IU] via SUBCUTANEOUS
  Filled 2024-03-07: qty 0.15

## 2024-03-07 MED ORDER — HYDROCODONE-ACETAMINOPHEN 5-325 MG PO TABS
1.0000 | ORAL_TABLET | ORAL | Status: DC | PRN
  Administered 2024-03-07 – 2024-03-12 (×16): 2 via ORAL
  Administered 2024-03-12: 1 via ORAL
  Administered 2024-03-13 – 2024-03-15 (×11): 2 via ORAL
  Filled 2024-03-07 (×29): qty 2

## 2024-03-07 MED ORDER — ONDANSETRON HCL 4 MG/2ML IJ SOLN
4.0000 mg | Freq: Once | INTRAMUSCULAR | Status: AC
  Administered 2024-03-07: 4 mg via INTRAVENOUS
  Filled 2024-03-07: qty 2

## 2024-03-07 MED ORDER — METOPROLOL SUCCINATE ER 50 MG PO TB24
50.0000 mg | ORAL_TABLET | Freq: Every day | ORAL | Status: DC
  Administered 2024-03-08 – 2024-03-15 (×8): 50 mg via ORAL
  Filled 2024-03-07 (×8): qty 2

## 2024-03-07 MED ORDER — MORPHINE SULFATE (PF) 4 MG/ML IV SOLN
4.0000 mg | Freq: Once | INTRAVENOUS | Status: AC
  Administered 2024-03-07: 4 mg via INTRAVENOUS
  Filled 2024-03-07: qty 1

## 2024-03-07 MED ORDER — SENNA 8.6 MG PO TABS
1.0000 | ORAL_TABLET | Freq: Two times a day (BID) | ORAL | Status: DC
  Administered 2024-03-07 – 2024-03-15 (×16): 8.6 mg via ORAL
  Filled 2024-03-07 (×16): qty 1

## 2024-03-07 MED ORDER — OXYBUTYNIN CHLORIDE 5 MG PO TABS
5.0000 mg | ORAL_TABLET | Freq: Three times a day (TID) | ORAL | Status: DC | PRN
  Administered 2024-03-07 – 2024-03-08 (×2): 5 mg via ORAL
  Filled 2024-03-07 (×2): qty 1

## 2024-03-07 MED ORDER — LORAZEPAM 2 MG/ML IJ SOLN
0.5000 mg | Freq: Four times a day (QID) | INTRAMUSCULAR | Status: AC | PRN
  Administered 2024-03-08: 0.5 mg via INTRAVENOUS
  Filled 2024-03-07: qty 1

## 2024-03-07 MED ORDER — IOHEXOL 300 MG/ML  SOLN
100.0000 mL | Freq: Once | INTRAMUSCULAR | Status: AC | PRN
  Administered 2024-03-07: 100 mL via INTRAVENOUS

## 2024-03-07 MED ORDER — ONDANSETRON HCL 4 MG/2ML IJ SOLN
4.0000 mg | Freq: Four times a day (QID) | INTRAMUSCULAR | Status: DC | PRN
  Administered 2024-03-08 – 2024-03-10 (×2): 4 mg via INTRAVENOUS
  Filled 2024-03-07 (×2): qty 2

## 2024-03-07 MED ORDER — ONDANSETRON HCL 4 MG PO TABS
4.0000 mg | ORAL_TABLET | Freq: Four times a day (QID) | ORAL | Status: DC | PRN
Start: 2024-03-07 — End: 2024-03-15

## 2024-03-07 MED ORDER — NICOTINE 14 MG/24HR TD PT24
14.0000 mg | MEDICATED_PATCH | Freq: Every day | TRANSDERMAL | Status: DC
  Administered 2024-03-07 – 2024-03-15 (×9): 14 mg via TRANSDERMAL
  Filled 2024-03-07 (×9): qty 1

## 2024-03-07 MED ORDER — ACETAMINOPHEN 650 MG RE SUPP: 650.0000 mg | Freq: Four times a day (QID) | RECTAL | Status: DC | PRN

## 2024-03-07 MED ORDER — POTASSIUM CHLORIDE CRYS ER 20 MEQ PO TBCR
40.0000 meq | EXTENDED_RELEASE_TABLET | ORAL | Status: AC
  Administered 2024-03-07 – 2024-03-08 (×2): 40 meq via ORAL
  Filled 2024-03-07 (×2): qty 2

## 2024-03-07 MED ORDER — PREGABALIN 75 MG PO CAPS
100.0000 mg | ORAL_CAPSULE | Freq: Two times a day (BID) | ORAL | Status: DC
  Administered 2024-03-07 – 2024-03-10 (×6): 100 mg via ORAL
  Filled 2024-03-07 (×6): qty 1

## 2024-03-07 MED ORDER — SODIUM CHLORIDE 0.9 % IV SOLN
1.0000 g | Freq: Once | INTRAVENOUS | Status: AC
  Administered 2024-03-07: 1 g via INTRAVENOUS
  Filled 2024-03-07: qty 10

## 2024-03-07 MED ORDER — LORAZEPAM 2 MG/ML IJ SOLN
INTRAMUSCULAR | Status: AC
  Administered 2024-03-07: 0.5 mg via INTRAVENOUS
  Filled 2024-03-07: qty 1

## 2024-03-07 MED ORDER — SODIUM CHLORIDE 0.9% IV SOLUTION: Freq: Once | INTRAVENOUS | Status: DC

## 2024-03-07 NOTE — Consult Note (Signed)
 Urology Consult Note   Requesting Attending Physician:  Rolinda Climes, DO Service Providing Consult: Urology  Consulting Attending: Homero Luster, MD   Reason for Consult:  hematuria, difficulty foley  HPI: Robert Burnett is seen in consultation for reasons noted above at the request of Rolinda Climes, DO  for evaluation of hematuria.  This is a 63 y.o. male with a history of left HGT1 UTUC s/p left nephU (2022, [place]), LGTa bladder cancer s/p 2/6 BCG, left BKA, CAD s/p DES, pAfib on eliquis  and plavix , asthma, HF, diabetes, GERD, HTN, HLD who presented to the ED with exertional SOB. He states he hasn't seen a urologist since 2023 and has had intermittent gross hematuria for the past 5 months. He has never gone into clot retention but has been passing clots for several weeks. He denies flank pain.   In the ED he is afebrile and HDS. Labwork notable for hgb 4.6 from 12.9, cr 1.51 from 1.36, FOBT negative. A bladder scan showed 300ccs but nursing was unable to place a foley catheter.  I placed a 82F hematuria catheter over a wire and started CBI, see procedure note for details.   He was last seen by [facility] in 2023. 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 03/2022 Group B Strep bacteremia in setting of diabetic L foot infxn/gangrene/osteo.   CAD (coronary artery disease)    Chronic HFrEF (heart failure with reduced ejection fraction) (HCC)    Depression     Diabetes mellitus    Diabetic peripheral neuropathy associated with type 2 diabetes mellitus (HCC) 12/12/2011   Fatty liver disease, nonalcoholic 10/27/2016   Foot osteomyelitis, left (HCC)    a. 2012 s/p toe amputations on L; b. 03/2022 - admitted w/ wet gangrene and necrotizing infxn w/ osteomyelitis-->s/p transmetatarsal followed by Lisfranc amputation.   GERD (gastroesophageal reflux disease)    H/O degenerative disc disease    History of blood transfusion    History of echocardiogram    a. 03/2022 Echo: EF 55-60%, no rwma, nl RV fxn, mildly elev PASP. Mild MR.   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[facility])     Past Surgical History:  Past Surgical History:  Procedure Laterality Date   ACHILLES TENDON SURGERY Left 03/28/2022   Procedure: ACHILLES LENGTHENING/KIDNER;  Surgeon: Floyce Hutching, DPM;  Location: [facility];  Service: Podiatry;  Laterality: Left;   AMPUTATION Left 03/25/2022   Procedure: AMPUTATION DIGITS,TRANSMETARSAL AMPUTATION, REMOVAL OF TOES AND BONE BEHIND TOES ;  Surgeon: Floyce Hutching, DPM;  Location: [facility];  Service: Podiatry;  Laterality: Left;   AMPUTATION Left 07/23/2022   Procedure: LEFT BELOW KNEE AMPUTATION;  Surgeon: Timothy Ford, MD;  Location: [facility] OR;  Service: Orthopedics;  Laterality: Left;   APPLICATION  OF WOUND VAC Left 03/25/2022   Procedure: APPLICATION OF WOUND VAC;  Surgeon: Floyce Hutching, DPM;  Location: [facility];  Service: Podiatry;  Laterality: Left;   CORONARY STENT INTERVENTION N/A 06/05/2022   Procedure: CORONARY STENT INTERVENTION;  Surgeon: Arty Binning, MD;  Location: [facility];  Service: Cardiovascular;  Laterality: N/A;   CYSTOSCOPY/RETROGRADE/URETEROSCOPY Left 10/29/2020   Procedure: CYSTOSCOPY/RETROGRADE/ LEFT URETEROSCOPY WITH BIOPSY, LEFT URETERAL STENT;  Surgeon: Florencio Hunting, MD;  Location: [facility];  Service: Urology;  Laterality: Left;   IRRIGATION AND DEBRIDEMENT  FOOT Left 03/28/2022   Procedure: AMPUTATION LISFRANC, SECONDARY WOUND CLOSURE;  Surgeon: Floyce Hutching, DPM;  Location: [facility];  Service: Podiatry;  Laterality: Left;   KNEE SURGERY Bilateral 1975 and 1989   LEFT HEART CATH AND CORONARY ANGIOGRAPHY N/A 06/05/2022   Procedure: LEFT HEART CATH AND CORONARY ANGIOGRAPHY;  Surgeon: Arty Binning, MD;  Location: [facility];  Service: Cardiovascular;  Laterality: N/A;   MULTIPLE TOOTH EXTRACTIONS     ROBOT ASSITED LAPAROSCOPIC NEPHROURETERECTOMY Left 12/13/2020   Procedure: XI ROBOT ASSITED LAPAROSCOPIC NEPHROURETERECTOMY , POST OPERATIVE INTRAVESICAL GEMCITABINE ;  Surgeon: Florencio Hunting, MD;  Location: [facility];  Service: Urology;  Laterality: Left;  ONLY NEEDS 240 MIN   STUMP REVISION Left 09/24/2022   Procedure: REVISION LEFT BELOW KNEE AMPUTATION;  Surgeon: Timothy Ford, MD;  Location: [facility] OR;  Service: Orthopedics;  Laterality: Left;   TOEM AMPUTATION  Left    2012, second toe   TRANSURETHRAL RESECTION OF BLADDER TUMOR N/A 08/29/2021   Procedure: TRANSURETHRAL RESECTION OF BLADDER TUMOR (TURBT) WITH CYSTOSCOPY;  Surgeon: Florencio Hunting, MD;  Location: [facility];  Service: Urology;  Laterality: N/A;  GENERAL ANESTHESIA WITH PARALYSIS   WISDOM TOOTH EXTRACTION      Medication: Current Facility-Administered Medications  Medication Dose Route Frequency Provider Last Rate Last Admin   0.9 %  sodium chloride  infusion (Manually program via Guardrails IV Fluids)   Intravenous Once Lucina Sabal, PA-C       morphine  (PF) 4 MG/ML injection 4 mg  4 mg Intravenous Once Lucina Sabal, PA-C       Current Outpatient Medications  Medication Sig Dispense Refill   acetaminophen  (TYLENOL ) 325 MG tablet Take 1-2 tablets (325-650 mg total) by mouth every 4 (four) hours as needed for mild pain.     apixaban  (ELIQUIS ) 5 MG TABS tablet Take 1 tablet (5 mg total) by mouth 2 (two) times daily. 180 tablet 1   ascorbic acid  (VITAMIN C ) 1000 MG tablet Take 1 tablet  (1,000 mg total) by mouth daily. 30 tablet 0   atorvastatin  (LIPITOR ) 80 MG tablet Take 1 tablet (80 mg total) by mouth daily. 90 tablet 1   clopidogrel  (PLAVIX ) 75 MG tablet TAKE ONE TABLET BY MOUTH ONCE DAILY 90 tablet 1   dapagliflozin  propanediol (FARXIGA ) 10 MG TABS tablet Take 1 tablet (10 mg total) by mouth daily before breakfast. 90 tablet 1   diclofenac  Sodium (VOLTAREN ) 1 % GEL Apply 2 g topically 4 (four) times daily as needed (Left hip pain). 200 g 0   docusate sodium  (COLACE) 100 MG capsule Take 1 capsule (100 mg total) by mouth daily. (Patient taking differently: Take 100 mg by mouth daily as needed.) 10 capsule 0   fluticasone  (FLONASE ) 50 MCG/ACT nasal spray Place 2 sprays into both nostrils daily. 16 g 6   furosemide  (LASIX ) 40 MG tablet Take 1 tablet (40 mg total) by mouth daily. 90 tablet 1  insulin  isophane & regular human KwikPen (NOVOLIN  70/30 KWIKPEN) (70-30) 100 UNIT/ML KwikPen Inject 8 Units into the skin in the morning and at bedtime. Dose decrease 30 mL 6   Insulin  Pen Needle (GNP ULTICARE PEN NEEDLES) 31G X 5 MM MISC USE TO INJECT INSULIN  TWO TIMES DAILY 200 each 1   Lancets (ONETOUCH DELICA PLUS LANCET33G) MISC USE ONE LANCETS TO TEST BLOOD SUGAR THREE TIMES A DAY 100 each 11   loratadine  (CLARITIN ) 10 MG tablet Take 1 tablet (10 mg total) by mouth daily. 30 tablet 6   methocarbamol  (ROBAXIN ) 750 MG tablet Take 1 tablet (750 mg total) by mouth every 8 (eight) hours as needed for muscle spasms. 60 tablet 3   metoprolol  succinate (TOPROL  XL) 50 MG 24 hr tablet Take 1 tablet (50 mg total) by mouth daily. Take with or immediately following a meal. 90 tablet 1   Misc. Devices MISC Knee walker.  Diagnosis -  left BKA 1 each 0   naloxone  (NARCAN ) nasal spray 4 mg/0.1 mL SMARTSIG:Both Nares     nicotine  (NICODERM CQ  - DOSED IN MG/24 HR) 7 mg/24hr patch 7 mg patch daily x2 weeks and stop 28 patch 2   ONETOUCH ULTRA test strip USE ONE TEST STRIP TO TEST BLOOD SUGAR THREE TIMES  DAILY 100 strip 11   pantoprazole  (PROTONIX ) 40 MG tablet Take 1 tablet (40 mg total) by mouth daily. 90 tablet 1   polyethylene glycol (MIRALAX  / GLYCOLAX ) 17 g packet Take 17 g by mouth daily. 14 each 0   pregabalin  (LYRICA ) 100 MG capsule Take 1 capsule (100 mg total) by mouth 2 (two) times daily. 60 capsule 4   traMADol  (ULTRAM ) 50 MG tablet Take 1 tablet (50 mg total) by mouth at bedtime as needed. 30 tablet 1   valsartan  (DIOVAN ) 320 MG tablet Take 1 tablet (320 mg total) by mouth daily. 90 tablet 1    Allergies: Allergies  Allergen Reactions   Amitriptyline  Hcl     Confusion   Baclofen    Cymbalta  [Duloxetine  Hcl]     confusion   Amlodipine Rash   Prozac [Fluoxetine Hcl] Rash    Social History: Social History   Tobacco Use   Smoking status: Every Day    Types: Pipe   Smokeless tobacco: Former    Types: Chew   Tobacco comments:    Using pipe until about a week ago (05/2022)    Quit cigarettes 2003ish  Vaping Use   Vaping status: Never Used  Substance Use Topics   Alcohol use: No   Drug use: No    Family History Family History  Problem Relation Age of Onset   Cancer Mother    Hypertension Father    Diabetes Father    Lung cancer Father    Diabetes Sister    Cancer Sister     Review of Systems 10 systems were reviewed and are negative except as noted specifically in the HPI.  Objective   Vital signs in last 24 hours: BP 131/79   Pulse 88   Temp 98.3 F (36.8 C) (Oral)   Resp 16   SpO2 100%   Physical Exam General: NAD, A&O, anxious HEENT: [address]/AT, EOMI, MMM Pulmonary: Normal work of breathing Cardiovascular: HDS, adequate peripheral perfusion Abdomen: Soft, NTTP, nondistended, . GU: circumcised phallus, urine dark red, No CVA tenderness Extremities: left BKA Neuro: Appropriate, no focal neurological deficits  Most Recent Labs: Lab Results  Component Value Date   WBC 3.7 (L)  03/07/2024   HGB 4.6 (LL) 03/07/2024   HCT 14.7 (L) 03/07/2024    PLT 137 (L) 03/07/2024    Lab Results  Component Value Date   NA 129 (L) 03/07/2024   K 3.4 (L) 03/07/2024   CL 90 (L) 03/07/2024   CO2 29 03/07/2024   BUN 20 03/07/2024   CREATININE 1.51 (H) 03/07/2024   CALCIUM  8.9 03/07/2024   MG 2.0 03/07/2024   PHOS 3.0 07/26/2022    Lab Results  Component Value Date   INR 1.01 01/31/2011   APTT (H) 01/31/2011    42        IF BASELINE aPTT IS ELEVATED, SUGGEST PATIENT RISK ASSESSMENT BE USED TO DETERMINE APPROPRIATE ANTICOAGULANT THERAPY.     Urine Culture: @[id number] (laburin,org,[id number])@   IMAGING: DG Chest 2 View Result Date: 03/07/2024 CLINICAL DATA:  Shortness of breath EXAM: CHEST - 2 VIEW COMPARISON:  X-ray 06/04/2022 FINDINGS: Overlapping cardiac leads. No consolidation, pneumothorax or effusion. No edema. Overlapping cardiac leads. Degenerative changes along the spine. IMPRESSION: No acute cardiopulmonary disease. Electronically Signed   By: Adrianna Horde M.D.   On: 03/07/2024 16:12    ------  Assessment:  62 y.o. male with complex medical history including CAD s/p DES, HFpEF, pAfib on eliquis  and plavix , HTN, HLD, diabetes, left BKA, HGT1 left UTUC s/p left nephU, Ta bladder cancer who presented with gross hematuria, SOB, and hgb of 4.6.   Recommendations: - Management of anemia per ED, agree with blood transfusion - Continue foley to CBI, wean to light pink - PRN ditropan  ordered for bladder spasms - Please give dose of CTX now for urinary tract manipulation - Agree with CT scan  - Please hold eliquis /plavix  if medically able - Urology will continue to follow   Thank you for this consult. Please contact the urology consult pager with any further questions/concerns.

## 2024-03-07 NOTE — ED Notes (Signed)
 Bladder scan completed. Approx in bladder. Scanned x 3 9 (262, 316, and 323)

## 2024-03-07 NOTE — ED Provider Triage Note (Signed)
 Emergency Medicine Provider Triage Evaluation Note  CLAYTEN ALLCOCK , a 63 y.o. male  was evaluated in triage.  Pt complains of shortness of breath, body aches, hematuria.  Review of Systems  Positive: As above Negative: As above  Physical Exam  BP (!) 142/88 (BP Location: Left Arm)   Pulse 80   Temp 98.3 F (36.8 C) (Oral)   Resp 16   SpO2 100%  Gen:   Awake, no distress   Resp:  Normal effort  MSK:   Moves extremities without difficulty  Other:    Medical Decision Making  Medically screening exam initiated at 3:04 PM.  Appropriate orders placed.  ELLSWORTH WALDSCHMIDT was informed that the remainder of the evaluation will be completed by another provider, this initial triage assessment does not replace that evaluation, and the importance of remaining in the ED until their evaluation is complete.    Lucina Sabal, PA-C 03/07/24 1504

## 2024-03-07 NOTE — ED Notes (Signed)
 Attempted to put foley cath in. Met resistance, no urine output and unable to inflate balloon. Notified Provider. JRPRN

## 2024-03-07 NOTE — ED Notes (Signed)
 Urologist at bedside working with patient to insert cath. Urology cart at bedside. JRPRN

## 2024-03-07 NOTE — H&P (Addendum)
 History and Physical    Robert Burnett  BJY:[id number]  DOB: 1960/11/15  DOA: 03/07/2024 PCP: Joaquin Mulberry, MD   Patient coming from: Home  Chief Complaint: persistent hematuria, weakness and hurting all over  HPI: Robert Burnett is a 63 y.o. male with medical history of bladder cancer, left BKA, coronary artery disease status post stent, paroxysmal atrial fibrillation, asthma, diabetes mellitus presents with ongiong hematuria and shortness of breath with exertion. He developed gross hematuria in January but did not bring this to the attention of any of his providers and has had persistent hematuria since then.  He has become increasingly weak and developed shortness of breath on exertion.  In the ED he was noted to have urinary retention and urology needed to be called to insert Foley.  During Foley insertion the patient became severely agitated and tachycardic.  He was placed on CBI and continued to be agitated and tachycardic.  ZOLL's pads were placed on his chest and 1 mg of IV Ativan  was ordered after which he fell asleep immediately.  He woke up briefly to give me a history and fell asleep again.  Regarding his treatment for bladder cancer he is not sure why his treatments were stopped and why he never went back to urology but he has been seeing his primary care physician. He tells me he is feeling better than when he first arrived to the hospital.  His pain is less but his legs are still bothering him.    ED Course:  Sodium 129, potassium 3.4, chloride 90, creatinine 1.51 WBC 3.7,  Hemoglobin 4.6, MCV 74.6 Platelets 137 Gross hematuria Fecal occult blood negative  Urology started CBI. He became anxious during foley and was given ativan .  2 U PRBC ordered Rocephin  x 1 requested by Urology   Review of Systems:  All other systems reviewed and apart from HPI, are negative.  Past Medical History:  Diagnosis Date   Allergy    Anemia    Anemia    Arthritis    ARTHRITIS IN  SPINE - MAKES PT HAVE CHEST PAIN- USES fENTANYL  PATCH    Asthma    IN TEENS    Bacteremia    a. 03/2022 Group B Strep bacteremia in setting of diabetic L foot infxn/gangrene/osteo.   CAD (coronary artery disease)    Chronic HFrEF (heart failure with reduced ejection fraction) (HCC)    Depression    Diabetes mellitus    Diabetic peripheral neuropathy associated with type 2 diabetes mellitus (HCC) 12/12/2011   Fatty liver disease, nonalcoholic 10/27/2016   Foot osteomyelitis, left (HCC)    a. 2012 s/p toe amputations on L; b. 03/2022 - admitted w/ wet gangrene and necrotizing infxn w/ osteomyelitis-->s/p transmetatarsal followed by Lisfranc amputation.   GERD (gastroesophageal reflux disease)    H/O degenerative disc disease    History of blood transfusion    History of echocardiogram    a. 03/2022 Echo: EF 55-60%, no rwma, nl RV fxn, mildly elev PASP. Mild MR.   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[facility])     Past Surgical History:  Procedure Laterality Date   ACHILLES TENDON SURGERY Left 03/28/2022   Procedure: ACHILLES LENGTHENING/KIDNER;  Surgeon: Floyce Hutching, DPM;  Location: [facility];  Service: Podiatry;  Laterality: Left;   AMPUTATION Left 03/25/2022   Procedure: AMPUTATION DIGITS,TRANSMETARSAL AMPUTATION, REMOVAL OF TOES AND  BONE BEHIND TOES ;  Surgeon: Floyce Hutching, DPM;  Location: [facility];  Service: Podiatry;  Laterality: Left;   AMPUTATION Left 07/23/2022   Procedure: LEFT BELOW KNEE AMPUTATION;  Surgeon: Timothy Ford, MD;  Location: [facility];  Service: Orthopedics;  Laterality: Left;   APPLICATION OF WOUND VAC Left 03/25/2022   Procedure: APPLICATION OF WOUND VAC;  Surgeon: Floyce Hutching, DPM;  Location: [facility];  Service: Podiatry;  Laterality: Left;   CORONARY STENT INTERVENTION N/A 06/05/2022   Procedure: CORONARY STENT INTERVENTION;  Surgeon: Arty Binning, MD;  Location: [facility];  Service:  Cardiovascular;  Laterality: N/A;   CYSTOSCOPY/RETROGRADE/URETEROSCOPY Left 10/29/2020   Procedure: CYSTOSCOPY/RETROGRADE/ LEFT URETEROSCOPY WITH BIOPSY, LEFT URETERAL STENT;  Surgeon: Florencio Hunting, MD;  Location: [facility];  Service: Urology;  Laterality: Left;   IRRIGATION AND DEBRIDEMENT FOOT Left 03/28/2022   Procedure: AMPUTATION LISFRANC, SECONDARY WOUND CLOSURE;  Surgeon: Floyce Hutching, DPM;  Location: [facility];  Service: Podiatry;  Laterality: Left;   KNEE SURGERY Bilateral 1975 and 1989   LEFT HEART CATH AND CORONARY ANGIOGRAPHY N/A 06/05/2022   Procedure: LEFT HEART CATH AND CORONARY ANGIOGRAPHY;  Surgeon: Arty Binning, MD;  Location: [facility];  Service: Cardiovascular;  Laterality: N/A;   MULTIPLE TOOTH EXTRACTIONS     ROBOT ASSITED LAPAROSCOPIC NEPHROURETERECTOMY Left 12/13/2020   Procedure: XI ROBOT ASSITED LAPAROSCOPIC NEPHROURETERECTOMY , POST OPERATIVE INTRAVESICAL GEMCITABINE ;  Surgeon: Florencio Hunting, MD;  Location: [facility];  Service: Urology;  Laterality: Left;  ONLY NEEDS 240 MIN   STUMP REVISION Left 09/24/2022   Procedure: REVISION LEFT BELOW KNEE AMPUTATION;  Surgeon: Timothy Ford, MD;  Location: [facility];  Service: Orthopedics;  Laterality: Left;   TOEM AMPUTATION  Left    2012, second toe   TRANSURETHRAL RESECTION OF BLADDER TUMOR N/A 08/29/2021   Procedure: TRANSURETHRAL RESECTION OF BLADDER TUMOR (TURBT) WITH CYSTOSCOPY;  Surgeon: Florencio Hunting, MD;  Location: [facility];  Service: Urology;  Laterality: N/A;  GENERAL ANESTHESIA WITH PARALYSIS   WISDOM TOOTH EXTRACTION      Social History:   reports that he has been smoking pipe. He has quit using smokeless tobacco.  His smokeless tobacco use included chew. He reports that he does not drink alcohol and does not use drugs.  Allergies  Allergen Reactions   Amitriptyline  Hcl     Confusion   Baclofen    Cymbalta  [Duloxetine  Hcl]     confusion   Amlodipine Rash   Prozac [Fluoxetine Hcl] Rash    Family History   Problem Relation Age of Onset   Cancer Mother    Hypertension Father    Diabetes Father    Lung cancer Father    Diabetes Sister    Cancer Sister      Prior to Admission medications   Medication Sig Start Date End Date Taking? Authorizing Provider  acetaminophen  (TYLENOL ) 325 MG tablet Take 1-2 tablets (325-650 mg total) by mouth every 4 (four) hours as needed for mild pain. 08/06/22   Angiulli, Everlyn Hockey, PA-C  apixaban  (ELIQUIS ) 5 MG TABS tablet Take 1 tablet (5 mg total) by mouth 2 (two) times daily. 12/08/23   Newlin, Enobong, MD  ascorbic acid  (VITAMIN C ) 1000 MG tablet Take 1 tablet (1,000 mg total) by mouth daily. 08/06/22   Angiulli, Everlyn Hockey, PA-C  atorvastatin  (LIPITOR ) 80 MG tablet Take 1 tablet (80 mg total) by mouth daily. 12/08/23   Newlin, Enobong, MD  clopidogrel  (PLAVIX ) 75  MG tablet TAKE ONE TABLET BY MOUTH ONCE DAILY 02/11/23   Newlin, Enobong, MD  dapagliflozin  propanediol (FARXIGA ) 10 MG TABS tablet Take 1 tablet (10 mg total) by mouth daily before breakfast. 12/08/23   Joaquin Mulberry, MD  diclofenac  Sodium (VOLTAREN ) 1 % GEL Apply 2 g topically 4 (four) times daily as needed (Left hip pain). 08/06/22   Angiulli, Everlyn Hockey, PA-C  docusate sodium  (COLACE) 100 MG capsule Take 1 capsule (100 mg total) by mouth daily. Patient taking differently: Take 100 mg by mouth daily as needed. 07/26/22   Vann, Jessica U, DO  fluticasone  (FLONASE ) 50 MCG/ACT nasal spray Place 2 sprays into both nostrils daily. 09/07/23   Newlin, Enobong, MD  furosemide  (LASIX ) 40 MG tablet Take 1 tablet (40 mg total) by mouth daily. 12/08/23   Newlin, Enobong, MD  insulin  isophane & regular human KwikPen (NOVOLIN  70/30 KWIKPEN) (70-30) 100 UNIT/ML KwikPen Inject 8 Units into the skin in the morning and at bedtime. Dose decrease 12/08/23   Newlin, Enobong, MD  Insulin  Pen Needle (GNP ULTICARE PEN NEEDLES) 31G X 5 MM MISC USE TO INJECT INSULIN  TWO TIMES DAILY 09/03/23   Newlin, Enobong, MD  Lancets (ONETOUCH  DELICA PLUS LANCET33G) MISC USE ONE LANCETS TO TEST BLOOD SUGAR THREE TIMES A DAY 09/03/23   Newlin, Enobong, MD  loratadine  (CLARITIN ) 10 MG tablet Take 1 tablet (10 mg total) by mouth daily. 09/07/23   Newlin, Enobong, MD  methocarbamol  (ROBAXIN ) 750 MG tablet Take 1 tablet (750 mg total) by mouth every 8 (eight) hours as needed for muscle spasms. 12/08/23   Newlin, Enobong, MD  metoprolol  succinate (TOPROL  XL) 50 MG 24 hr tablet Take 1 tablet (50 mg total) by mouth daily. Take with or immediately following a meal. 12/08/23   Joaquin Mulberry, MD  Misc. Devices MISC Knee walker.  Diagnosis -  left BKA 09/07/23   Joaquin Mulberry, MD  naloxone  (NARCAN ) nasal spray 4 mg/0.1 mL SMARTSIG:Both Nares 10/29/22   [provider]  nicotine  (NICODERM CQ  - DOSED IN MG/24 HR) 7 mg/24hr patch 7 mg patch daily x2 weeks and stop 09/08/23   Newlin, Enobong, MD  [facility] ULTRA test strip USE ONE TEST STRIP TO TEST BLOOD SUGAR THREE TIMES DAILY 09/03/23   Newlin, Enobong, MD  pantoprazole  (PROTONIX ) 40 MG tablet Take 1 tablet (40 mg total) by mouth daily. 12/08/23   Newlin, Enobong, MD  polyethylene glycol (MIRALAX  / GLYCOLAX ) 17 g packet Take 17 g by mouth daily. 08/06/22   Angiulli, Everlyn Hockey, PA-C  pregabalin  (LYRICA ) 100 MG capsule Take 1 capsule (100 mg total) by mouth 2 (two) times daily. 12/08/23   Newlin, Enobong, MD  traMADol  (ULTRAM ) 50 MG tablet Take 1 tablet (50 mg total) by mouth at bedtime as needed. 12/08/23   Newlin, Enobong, MD  valsartan  (DIOVAN ) 320 MG tablet Take 1 tablet (320 mg total) by mouth daily. 12/08/23   Joaquin Mulberry, MD    Physical Exam: Wt Readings from Last 3 Encounters:  09/24/22 81.6 kg  09/10/22 81.6 kg  08/21/22 80.7 kg   Vitals:   03/07/24 1930 03/07/24 2100 03/07/24 2130 03/07/24 2157  BP: (!) 155/85 (!) 152/84 (!) 148/96 (!) 152/84  Pulse: (!) 137 (!) 137 (!) 119 (!) 137  Resp: (!) 22 (!) 28 14 (!) 22  Temp:  97.8 F (36.6 C) 97.7 F (36.5 C) 97.8 F (36.6 C)   TempSrc:  Oral Oral Oral  SpO2: 100% 94% 100% 94%  Constitutional:  Calm & comfortable Eyes: PERRLA, lids and conjunctivae normal ENT:  Mucous membranes are moist.  Pharynx clear of exudate   Normal dentition.  Respiratory:  Clear to auscultation bilaterally  Normal respiratory effort.  Cardiovascular:  S1 & S2 heard, regular rate and rhythm No Murmurs Abdomen:  Non distended No tenderness, No masses Bowel sounds normal Extremities:  No clubbing / cyanosis No pedal edema  Skin:  No rashes, lesions or ulcers Neurologic:  AAO x 3 CN 2-12 grossly intact Sensation intact Strength 5/5 in all 4 extremities Psychiatric:  Normal Mood and affect    Labs on Admission: I have personally reviewed labs and imaging studies   EKG: Independently reviewed.  Sinus tachycardia at 134 bpm  Assessment/Plan Principal Problem:   Symptomatic anemia secondary to gross hematuria since 1/25 Pancytopenic -Hemoglobin 4.6 dropping from 12.9 in 10/24 and related to hematuria since January - 2 units of packed red blood cells ordered - Likely iron deficient as well as he is microcytic therefore we will check iron levels tomorrow to see if he needs iron transfusions after these packed red blood cells - For pancytopenia, checking B12 level - He is currently receiving blood and I have placed an order for the RN to enter a CBC ordered to be drawn 2 hours after this transfusion - Admit to stepdown unit  Active Problems:   Gross hematuria with urinary retention-bladder cancer - Status post TURBT in 2022 - Per records, he received 2 out of 6 treatments of BCG-he does tell me that he never completed treatments for his bladder cancer but is unable to tell me why - He states he has not gone back to urology since 2023 - Appreciate urology evaluation-continue CBI per urology  Hyponatremia and hypokalemia - Likely secondary to furosemide  - Hold furosemide  and replace potassium -Magnesium   level normal at 2.0    CAD S/P percutaneous coronary angioplasty and stenting to proximal LAD   Ischemic cardiomyopathy   PAF (paroxysmal atrial fibrillation) (HCC) -Has been in sinus rhythm and sinus tachycardia while in the ED - Hold Farxiga , furosemide , valsartan , Eliquis  and Paxil for today - Resume Toprol     Tobacco abuse - Smokes about half pack a day - NicoDerm patch ordered - Counseling could not be done as he fell back asleep again  Diabetes mellitus diabetic peripheral neuropathy associated with type 2 diabetes mellitus (HCC) - Hemoglobin A1c was last checked in 2023 and was 6.8 - He takes Novolin  70/30 and Farxiga  as outpatient - Sliding scale insulin  ordered - Resume Lyrica      S/P BKA (below knee amputation) unilateral, left (HCC)  Chronic pain - Presented with a complaint of pain all over - Prior PCP notes, previously used to go to pain clinic for his diffuse pain that was felt to be secondary to arthritis - PDMP reviewed: Fentanyl  patch last prescribed in 1/22-24 at a dose of 75 mcg - Tramadol  and pregabalin  prescribed on 12/08/2023 at a dose of 50 mg by his PCP   DVT prophylaxis: SCD  Code Status: We discussed his CODE STATUS and he told me he wanted to be a full code.  If he was unresponsive and unable to make decisions, he would want his cousins to make decisions for him.  Consults called: Urology Admission status: Inpatient Level of care: Roberto Chinchilla MD [facility]    03/07/2024, 10:02 PM

## 2024-03-07 NOTE — ED Triage Notes (Addendum)
 Pt arrives via EMS from home. Pt reports exertional sob for the past few months. States he has also had blood in his urine for the past month. Pt is on eliquis  and plavix . Hx of bladder cancer, currently not undergoing treatment. Pt is AxOx4.

## 2024-03-07 NOTE — ED Provider Notes (Signed)
 [place] EMERGENCY DEPARTMENT AT [facility] Provider Note   CSN: [id number] Arrival date & time: 03/07/24  1442     History  Chief Complaint  Patient presents with   Shortness of Breath   Hematuria    Robert Burnett is a 63 y.o. male.  63 year old male presents today for concern of some weakness, hematuria which has been ongoing now for 5 to 6 months, and generalized pain.  History of urothelial cancer and followed with urology until 2022.  Endorses some chest wall pain but states this is typical for his arthritis.  The history is provided by the patient. No language interpreter was used.       Home Medications Prior to Admission medications   Medication Sig Start Date End Date Taking? Authorizing Provider  acetaminophen  (TYLENOL ) 325 MG tablet Take 1-2 tablets (325-650 mg total) by mouth every 4 (four) hours as needed for mild pain. 08/06/22   Angiulli, Everlyn Hockey, PA-C  apixaban  (ELIQUIS ) 5 MG TABS tablet Take 1 tablet (5 mg total) by mouth 2 (two) times daily. 12/08/23   Newlin, Enobong, MD  ascorbic acid  (VITAMIN C ) 1000 MG tablet Take 1 tablet (1,000 mg total) by mouth daily. 08/06/22   Angiulli, Everlyn Hockey, PA-C  atorvastatin  (LIPITOR ) 80 MG tablet Take 1 tablet (80 mg total) by mouth daily. 12/08/23   Newlin, Enobong, MD  clopidogrel  (PLAVIX ) 75 MG tablet TAKE ONE TABLET BY MOUTH ONCE DAILY 02/11/23   Newlin, Enobong, MD  dapagliflozin  propanediol (FARXIGA ) 10 MG TABS tablet Take 1 tablet (10 mg total) by mouth daily before breakfast. 12/08/23   Joaquin Mulberry, MD  diclofenac  Sodium (VOLTAREN ) 1 % GEL Apply 2 g topically 4 (four) times daily as needed (Left hip pain). 08/06/22   Angiulli, Everlyn Hockey, PA-C  docusate sodium  (COLACE) 100 MG capsule Take 1 capsule (100 mg total) by mouth daily. Patient taking differently: Take 100 mg by mouth daily as needed. 07/26/22   Vann, Jessica U, DO  fluticasone  (FLONASE ) 50 MCG/ACT nasal spray Place 2 sprays into both nostrils  daily. 09/07/23   Newlin, Enobong, MD  furosemide  (LASIX ) 40 MG tablet Take 1 tablet (40 mg total) by mouth daily. 12/08/23   Newlin, Enobong, MD  insulin  isophane & regular human KwikPen (NOVOLIN  70/30 KWIKPEN) (70-30) 100 UNIT/ML KwikPen Inject 8 Units into the skin in the morning and at bedtime. Dose decrease 12/08/23   Newlin, Enobong, MD  Insulin  Pen Needle (GNP ULTICARE PEN NEEDLES) 31G X 5 MM MISC USE TO INJECT INSULIN  TWO TIMES DAILY 09/03/23   Newlin, Enobong, MD  Lancets (ONETOUCH DELICA PLUS LANCET33G) MISC USE ONE LANCETS TO TEST BLOOD SUGAR THREE TIMES A DAY 09/03/23   Newlin, Enobong, MD  loratadine  (CLARITIN ) 10 MG tablet Take 1 tablet (10 mg total) by mouth daily. 09/07/23   Newlin, Enobong, MD  methocarbamol  (ROBAXIN ) 750 MG tablet Take 1 tablet (750 mg total) by mouth every 8 (eight) hours as needed for muscle spasms. 12/08/23   Newlin, Enobong, MD  metoprolol  succinate (TOPROL  XL) 50 MG 24 hr tablet Take 1 tablet (50 mg total) by mouth daily. Take with or immediately following a meal. 12/08/23   Joaquin Mulberry, MD  Misc. Devices MISC Knee walker.  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 12/08/23   Newlin, Enobong, MD  polyethylene glycol (MIRALAX  / GLYCOLAX ) 17 g packet Take 17 g by mouth daily. 08/06/22   Angiulli, Everlyn Hockey, PA-C  pregabalin  (LYRICA ) 100 MG capsule Take 1 capsule (100 mg total) by mouth 2 (two) times daily. 12/08/23   Newlin, Enobong, MD  traMADol  (ULTRAM ) 50 MG tablet Take 1 tablet (50 mg total) by mouth at bedtime as needed. 12/08/23   Newlin,  Enobong, MD  valsartan  (DIOVAN ) 320 MG tablet Take 1 tablet (320 mg total) by mouth daily. 12/08/23   Newlin, Enobong, MD      Allergies    Amitriptyline  hcl, Baclofen, Cymbalta  [duloxetine  hcl], Amlodipine, and Prozac [fluoxetine hcl]    Review of Systems   Review of Systems  Physical Exam Updated Vital Signs BP (!) 148/96   Pulse (!) 119   Temp 97.7 F (36.5 C) (Oral)   Resp 14   SpO2 100%  Physical Exam Vitals and nursing note reviewed.  Constitutional:      General: He is not in acute distress.    Appearance: Normal appearance. He is not ill-appearing.  HENT:     Head: Normocephalic and atraumatic.     Nose: Nose normal.  Eyes:     Conjunctiva/sclera: Conjunctivae normal.  Pulmonary:     Effort: Pulmonary effort is normal. No respiratory distress.  Abdominal:     General: There is no distension.     Palpations: Abdomen is soft.     Tenderness: There is no abdominal tenderness. There is no guarding.  Musculoskeletal:        General: No deformity. Normal range of motion.     Comments: Left below the knee amputation noted.  Skin:    Findings: No rash.  Neurological:     Mental Status: He is alert.     ED Results / Procedures / Treatments   Labs (all labs ordered are listed, but only abnormal results are displayed) Labs Reviewed  URINALYSIS, ROUTINE W REFLEX MICROSCOPIC - Abnormal; Notable for the following components:      Result Value   Color, Urine RED (*)    APPearance CLOUDY (*)    Hgb urine dipstick   (*)    Value: TEST NOT REPORTED DUE TO COLOR INTERFERENCE OF URINE PIGMENT   Ketones, ur   (*)    Value: TEST NOT REPORTED DUE TO COLOR INTERFERENCE OF URINE PIGMENT   Protein, ur   (*)    Value: TEST NOT REPORTED DUE TO COLOR INTERFERENCE OF URINE PIGMENT   Leukocytes,Ua   (*)    Value: TEST NOT REPORTED DUE TO COLOR INTERFERENCE OF URINE PIGMENT   All other components within normal limits  CBC WITH DIFFERENTIAL/PLATELET - Abnormal; Notable for the  following components:   WBC 3.7 (*)    RBC 1.97 (*)    Hemoglobin 4.6 (*)    HCT 14.7 (*)    MCV 74.6 (*)    MCH 23.4 (*)    RDW 15.7 (*)    Platelets 137 (*)    All other components within normal limits  COMPREHENSIVE METABOLIC PANEL WITH GFR - Abnormal; Notable for the following components:   Sodium 129 (*)    Potassium 3.4 (*)  Chloride 90 (*)    Glucose, Bld 104 (*)    Creatinine, Ser 1.51 (*)    GFR, Estimated 52 (*)    All other components within normal limits  MAGNESIUM   URINALYSIS, MICROSCOPIC (REFLEX)  POC OCCULT BLOOD, ED  TYPE AND SCREEN  PREPARE RBC (CROSSMATCH)  TROPONIN I (HIGH SENSITIVITY)  TROPONIN I (HIGH SENSITIVITY)    EKG None  Radiology CT ABDOMEN PELVIS W CONTRAST Result Date: 03/07/2024 CLINICAL DATA:  Abdominal pain EXAM: CT ABDOMEN AND PELVIS WITH CONTRAST TECHNIQUE: Multidetector CT imaging of the abdomen and pelvis was performed using the standard protocol following bolus administration of intravenous contrast. RADIATION DOSE REDUCTION: This exam was performed according to the departmental dose-optimization program which includes automated exposure control, adjustment of the mA and/or kV according to patient size and/or use of iterative reconstruction technique. CONTRAST:  OMNIPAQUE  IOHEXOL  300 MG/ML  SOLN COMPARISON:  10/02/2020 FINDINGS: Lower chest: No acute abnormality Hepatobiliary: Small layering stones within the gallbladder. No biliary ductal dilatation. No CT evidence of acute cholecystitis. No focal hepatic abnormality. Pancreas: No focal abnormality or ductal dilatation. Spleen: No focal abnormality. Mild splenomegaly with a craniocaudal length of 13.6 cm. Adrenals/Urinary Tract: Poor prior left nephrectomy adrenal glands unremarkable. No suspicious right renal lesion. No stones or hydronephrosis. Foley catheter in the bladder. There appears to be a filling defect along the right posterior bladder wall measuring 2.5 cm. Stomach/Bowel:  Stomach, large and small bowel grossly unremarkable. Vascular/Lymphatic: Diffuse aortoiliac atherosclerosis. No evidence of aneurysm or adenopathy. Reproductive: No visible focal abnormality. Other: No free fluid or free air. Musculoskeletal: No acute bony abnormality. IMPRESSION: Cholelithiasis.  No CT evidence of acute cholecystitis. Splenomegaly, similar to prior study. Soft tissue filling defect within the right posterior bladder. While this could reflect blood clot, cannot exclude bladder wall mass. Recommend urologic consultation for possible cystoscopy. Aortoiliac atherosclerosis. Electronically Signed   By: Janeece Mechanic M.D.   On: 03/07/2024 21:48   DG Chest 2 View Result Date: 03/07/2024 CLINICAL DATA:  Shortness of breath EXAM: CHEST - 2 VIEW COMPARISON:  X-ray 06/04/2022 FINDINGS: Overlapping cardiac leads. No consolidation, pneumothorax or effusion. No edema. Overlapping cardiac leads. Degenerative changes along the spine. IMPRESSION: No acute cardiopulmonary disease. Electronically Signed   By: Adrianna Horde M.D.   On: 03/07/2024 16:12    Procedures .Critical Care  Performed by: Lucina Sabal, PA-C Authorized by: Lucina Sabal, PA-C   Critical care provider statement:    Critical care time (minutes):  75   Critical care was necessary to treat or prevent imminent or life-threatening deterioration of the following conditions:  Circulatory failure   Critical care was time spent personally by me on the following activities:  Development of treatment plan with patient or surrogate, discussions with consultants, evaluation of patient's response to treatment, examination of patient, ordering and review of laboratory studies, ordering and review of radiographic studies, ordering and performing treatments and interventions, pulse oximetry, re-evaluation of patient's condition and review of old charts   Care discussed with: admitting provider       Medications Ordered in ED Medications  0.9 %   sodium chloride  infusion (Manually program via Guardrails IV Fluids) (has no administration in time range)  oxybutynin  (DITROPAN ) tablet 5 mg (has no administration in time range)  fentaNYL  (SUBLIMAZE ) injection 50 mcg (50 mcg Intravenous Given 03/07/24 1708)  ondansetron  (ZOFRAN ) injection 4 mg (4 mg Intravenous Given 03/07/24 1709)  morphine  (PF) 4 MG/ML injection 4 mg (4 mg Intravenous Given 03/07/24  1940)  LORazepam  (ATIVAN ) injection 1 mg (1 mg Intravenous Given 03/07/24 1958)  cefTRIAXone  (ROCEPHIN ) 1 g in sodium chloride  0.9 % 100 mL IVPB (0 g Intravenous Stopped 03/07/24 2149)  iohexol  (OMNIPAQUE ) 300 MG/ML solution 100 mL (100 mLs Intravenous Contrast Given 03/07/24 2047)  LORazepam  (ATIVAN ) injection 1 mg (1 mg Intravenous Given 03/07/24 2133)    ED Course/ Medical Decision Making/ A&P Clinical Course as of 03/07/24 2154  Mon Mar 07, 2024  2028 EKG 12-Lead [AA]    Clinical Course User Index [AA] Lucina Sabal, PA-C                                 Medical Decision Making Amount and/or Complexity of Data Reviewed Labs: ordered. Radiology: ordered. ECG/medicine tests:  Decision-making details documented in ED Course.  Risk Prescription drug management. Decision regarding hospitalization.   Medical Decision Making / ED Course   This patient presents to the ED for concern of hematuria, chest pain, this involves an extensive number of treatment options, and is a complaint that carries with it a high risk of complications and morbidity.  The differential diagnosis includes ACS, PE, pneumonia, musculoskeletal pain, nephrolithiasis, malignancy  MDM: 63 year old male presents today for concern of hematuria, weakness, as well as chest wall pain.  Overall well-appearing.  CBC shows no leukocytosis but does show hemoglobin of 4.6 which is new.  He consents to blood transfusion.  2 units of PRBCs were ordered.  CMP shows potassium 3.4, sodium of 121 creatinine 1.51 otherwise no acute  concern.  Fecal occult blood test negative.  Denies melanotic stools.  Magnesium  2.0.  Given duration of hematuria this is likely the source.  Discussed with urology.  They placed Foley catheter at bedside and started CBI.  They will continue to follow along and recommend medicine admission.  Patient did become tachycardic during the irrigation process.  Ativan  given to help with this tachycardia as he is reporting significant anxiety. Additional dose was required later.  EKG without acute ischemic change.  Troponin negative.  Discussed with hospitalist will evaluate patient for admission.  Patient is hemodynamically stable.  CT with soft tissue filling defect in the bladder otherwise without acute concern.  This could be malignancy or blood clot.   Lab Tests: -I ordered, reviewed, and interpreted labs.   The pertinent results include:   Labs Reviewed  URINALYSIS, ROUTINE W REFLEX MICROSCOPIC - Abnormal; Notable for the following components:      Result Value   Color, Urine RED (*)    APPearance CLOUDY (*)    Hgb urine dipstick   (*)    Value: TEST NOT REPORTED DUE TO COLOR INTERFERENCE OF URINE PIGMENT   Ketones, ur   (*)    Value: TEST NOT REPORTED DUE TO COLOR INTERFERENCE OF URINE PIGMENT   Protein, ur   (*)    Value: TEST NOT REPORTED DUE TO COLOR INTERFERENCE OF URINE PIGMENT   Leukocytes,Ua   (*)    Value: TEST NOT REPORTED DUE TO COLOR INTERFERENCE OF URINE PIGMENT   All other components within normal limits  CBC WITH DIFFERENTIAL/PLATELET - Abnormal; Notable for the following components:   WBC 3.7 (*)    RBC 1.97 (*)    Hemoglobin 4.6 (*)    HCT 14.7 (*)    MCV 74.6 (*)    MCH 23.4 (*)    RDW 15.7 (*)    Platelets 137 (*)  All other components within normal limits  COMPREHENSIVE METABOLIC PANEL WITH GFR - Abnormal; Notable for the following components:   Sodium 129 (*)    Potassium 3.4 (*)    Chloride 90 (*)    Glucose, Bld 104 (*)    Creatinine, Ser 1.51 (*)     GFR, Estimated 52 (*)    All other components within normal limits  MAGNESIUM   URINALYSIS, MICROSCOPIC (REFLEX)  POC OCCULT BLOOD, ED  TYPE AND SCREEN  PREPARE RBC (CROSSMATCH)  TROPONIN I (HIGH SENSITIVITY)  TROPONIN I (HIGH SENSITIVITY)      EKG  EKG Interpretation Date/Time:    Ventricular Rate:    PR Interval:    QRS Duration:    QT Interval:    QTC Calculation:   R Axis:      Text Interpretation:           Imaging Studies ordered: I ordered imaging studies including chest x-ray, CT abdomen pelvis with contrast I independently visualized and interpreted imaging. I agree with the radiologist interpretation   Medicines ordered and prescription drug management: Meds ordered this encounter  Medications   fentaNYL  (SUBLIMAZE ) injection 50 mcg   ondansetron  (ZOFRAN ) injection 4 mg   0.9 %  sodium chloride  infusion (Manually program via Guardrails IV Fluids)   morphine  (PF) 4 MG/ML injection 4 mg   LORazepam  (ATIVAN ) injection 1 mg   cefTRIAXone  (ROCEPHIN ) 1 g in sodium chloride  0.9 % 100 mL IVPB    Antibiotic Indication::   Other Indication (list below)    Other Indication::   bladder irrigation prophylaxis   iohexol  (OMNIPAQUE ) 300 MG/ML solution 100 mL   oxybutynin  (DITROPAN ) tablet 5 mg   LORazepam  (ATIVAN ) injection 1 mg    -I have reviewed the patients home medicines and have made adjustments as needed  Critical interventions Blood transfusions, Ativan , pain control  Consultations Obtained: I requested consultation with the urology,  and discussed lab and imaging findings as well as pertinent plan - they recommend: As above   Cardiac Monitoring: The patient was maintained on a cardiac monitor.  I personally viewed and interpreted the cardiac monitored which showed an underlying rhythm of: Normal sinus rhythm, later sinus tach   Reevaluation: After the interventions noted above, I reevaluated the patient and found that they have :stayed the  same  Co morbidities that complicate the patient evaluation  Past Medical History:  Diagnosis Date   Allergy    Anemia    Anemia    Arthritis    ARTHRITIS IN SPINE - MAKES PT HAVE CHEST PAIN- USES fENTANYL  PATCH    Asthma    IN TEENS    Bacteremia    a. 03/2022 Group B Strep bacteremia in setting of diabetic L foot infxn/gangrene/osteo.   CAD (coronary artery disease)    Chronic HFrEF (heart failure with reduced ejection fraction) (HCC)    Depression    Diabetes mellitus    Diabetic peripheral neuropathy associated with type 2 diabetes mellitus (HCC) 12/12/2011   Fatty liver disease, nonalcoholic 10/27/2016   Foot osteomyelitis, left (HCC)    a. 2012 s/p toe amputations on L; b. 03/2022 - admitted w/ wet gangrene and necrotizing infxn w/ osteomyelitis-->s/p transmetatarsal followed by Lisfranc amputation.   GERD (gastroesophageal reflux disease)    H/O degenerative disc disease    History of blood transfusion    History of echocardiogram    a. 03/2022 Echo: EF 55-60%, no rwma, nl RV fxn, mildly elev PASP. Mild MR.  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(HCC)       Dispostion: Discussed with hospitalist will evaluate patient for admission.   Final Clinical Impression(s) / ED Diagnoses Final diagnoses:  Blood loss anemia  Hematuria, unspecified type    Rx / DC Orders ED Discharge Orders     None         Lucina Sabal, PA-C 03/07/24 2201    Rolinda Climes, DO 03/08/24 0008

## 2024-03-07 NOTE — ED Notes (Signed)
 Provider notified of patients condition and VS, increased agitation. JRPRN

## 2024-03-08 DIAGNOSIS — D649 Anemia, unspecified: Secondary | ICD-10-CM | POA: Diagnosis not present

## 2024-03-08 LAB — CBC
HCT: 19.6 % — ABNORMAL LOW (ref 39.0–52.0)
Hemoglobin: 6.3 g/dL — CL (ref 13.0–17.0)
MCH: 25.8 pg — ABNORMAL LOW (ref 26.0–34.0)
MCHC: 32.1 g/dL (ref 30.0–36.0)
MCV: 80.3 fL (ref 80.0–100.0)
Platelets: 128 10*3/uL — ABNORMAL LOW (ref 150–400)
RBC: 2.44 MIL/uL — ABNORMAL LOW (ref 4.22–5.81)
RDW: 15.8 % — ABNORMAL HIGH (ref 11.5–15.5)
WBC: 8 10*3/uL (ref 4.0–10.5)
nRBC: 0 % (ref 0.0–0.2)

## 2024-03-08 LAB — IRON AND TIBC
Iron: 31 ug/dL — ABNORMAL LOW (ref 45–182)
Saturation Ratios: 7 % — ABNORMAL LOW (ref 17.9–39.5)
TIBC: 444 ug/dL (ref 250–450)
UIBC: 413 ug/dL

## 2024-03-08 LAB — GLUCOSE, CAPILLARY
Glucose-Capillary: 121 mg/dL — ABNORMAL HIGH (ref 70–99)
Glucose-Capillary: 110 mg/dL — ABNORMAL HIGH (ref 70–99)
Glucose-Capillary: 161 mg/dL — ABNORMAL HIGH (ref 70–99)
Glucose-Capillary: 155 mg/dL — ABNORMAL HIGH (ref 70–99)

## 2024-03-08 LAB — BASIC METABOLIC PANEL WITH GFR
Anion gap: 14 (ref 5–15)
BUN: 23 mg/dL (ref 8–23)
CO2: 23 mmol/L (ref 22–32)
Calcium: 9.1 mg/dL (ref 8.9–10.3)
Chloride: 93 mmol/L — ABNORMAL LOW (ref 98–111)
Creatinine, Ser: 1.85 mg/dL — ABNORMAL HIGH (ref 0.61–1.24)
GFR, Estimated: 40 mL/min — ABNORMAL LOW (ref >60)
Glucose, Bld: 125 mg/dL — ABNORMAL HIGH (ref 70–99)
Potassium: 3.6 mmol/L (ref 3.5–5.1)
Sodium: 130 mmol/L — ABNORMAL LOW (ref 135–145)

## 2024-03-08 LAB — RETICULOCYTES
Immature Retic Fract: 15.2 % (ref 2.3–15.9)
RBC.: 2.48 MIL/uL — ABNORMAL LOW (ref 4.22–5.81)
Retic Count, Absolute: 33.2 10*3/uL (ref 19.0–186.0)
Retic Ct Pct: 1.3 % (ref 0.4–3.1)

## 2024-03-08 LAB — HEMOGLOBIN AND HEMATOCRIT, BLOOD
HCT: 19.9 % — ABNORMAL LOW (ref 39.0–52.0)
HCT: 22.2 % — ABNORMAL LOW (ref 39.0–52.0)
Hemoglobin: 6.5 g/dL — CL (ref 13.0–17.0)
Hemoglobin: 7.2 g/dL — ABNORMAL LOW (ref 13.0–17.0)

## 2024-03-08 LAB — HEMOGLOBIN A1C
Hgb A1c MFr Bld: 5.4 % (ref 4.8–5.6)
Mean Plasma Glucose: 108.28 mg/dL

## 2024-03-08 LAB — FERRITIN: Ferritin: 3 ng/mL — ABNORMAL LOW (ref 24–336)

## 2024-03-08 LAB — PREPARE RBC (CROSSMATCH)

## 2024-03-08 LAB — FOLATE: Folate: 13.7 ng/mL (ref >5.9)

## 2024-03-08 LAB — HIV ANTIBODY (ROUTINE TESTING W REFLEX): HIV Screen 4th Generation wRfx: NONREACTIVE

## 2024-03-08 LAB — VITAMIN B12: Vitamin B-12: 392 pg/mL (ref 180–914)

## 2024-03-08 MED ORDER — HYOSCYAMINE SULFATE 0.125 MG PO TBDP
0.1250 mg | ORAL_TABLET | ORAL | Status: DC | PRN
  Administered 2024-03-08 – 2024-03-09 (×5): 0.125 mg via SUBLINGUAL
  Filled 2024-03-08 (×9): qty 1

## 2024-03-08 MED ORDER — CHLORHEXIDINE GLUCONATE CLOTH 2 % EX PADS
6.0000 | MEDICATED_PAD | Freq: Every day | CUTANEOUS | Status: DC
  Administered 2024-03-08 – 2024-03-14 (×7): 6 via TOPICAL

## 2024-03-08 MED ORDER — FENTANYL CITRATE PF 50 MCG/ML IJ SOSY
25.0000 ug | PREFILLED_SYRINGE | Freq: Once | INTRAMUSCULAR | Status: AC | PRN
  Administered 2024-03-08: 25 ug via INTRAVENOUS
  Filled 2024-03-08: qty 1

## 2024-03-08 MED ORDER — FENTANYL CITRATE PF 50 MCG/ML IJ SOSY: 25.0000 ug | PREFILLED_SYRINGE | Freq: Once | INTRAMUSCULAR | Status: DC

## 2024-03-08 MED ORDER — SODIUM CHLORIDE 0.9 % IR SOLN
3000.0000 mL | Status: DC
  Administered 2024-03-08 – 2024-03-13 (×50): 3000 mL

## 2024-03-08 MED ORDER — PNEUMOCOCCAL 20-VAL CONJ VACC 0.5 ML IM SUSY
0.5000 mL | PREFILLED_SYRINGE | INTRAMUSCULAR | Status: DC
  Filled 2024-03-08: qty 0.5

## 2024-03-08 MED ORDER — POLYETHYLENE GLYCOL 3350 17 G PO PACK: 17.0000 g | PACK | Freq: Every day | ORAL | Status: DC | PRN

## 2024-03-08 MED ORDER — MORPHINE SULFATE (PF) 2 MG/ML IV SOLN
1.0000 mg | INTRAVENOUS | Status: DC | PRN
  Administered 2024-03-08 – 2024-03-15 (×30): 1 mg via INTRAVENOUS
  Filled 2024-03-08 (×31): qty 1

## 2024-03-08 MED ORDER — SODIUM CHLORIDE 0.9% IV SOLUTION: Freq: Once | INTRAVENOUS | Status: DC

## 2024-03-08 MED ORDER — ENSURE ENLIVE PO LIQD
237.0000 mL | Freq: Two times a day (BID) | ORAL | Status: DC
  Administered 2024-03-08 – 2024-03-10 (×5): 237 mL via ORAL

## 2024-03-08 MED ORDER — ORAL CARE MOUTH RINSE: 15.0000 mL | OROMUCOSAL | Status: DC | PRN

## 2024-03-08 MED ORDER — METHOCARBAMOL 1000 MG/10ML IJ SOLN
500.0000 mg | Freq: Four times a day (QID) | INTRAMUSCULAR | Status: AC | PRN
  Administered 2024-03-08 – 2024-03-09 (×2): 500 mg via INTRAVENOUS
  Filled 2024-03-08 (×2): qty 10

## 2024-03-08 MED ORDER — SODIUM CHLORIDE 0.9% IV SOLUTION: Freq: Once | INTRAVENOUS | Status: AC

## 2024-03-08 MED ORDER — METHOCARBAMOL 1000 MG/10ML IJ SOLN: 500.0000 mg | Freq: Once | INTRAMUSCULAR | Status: DC | PRN

## 2024-03-08 NOTE — Plan of Care (Signed)
  Problem: Nutrition: Goal: Adequate nutrition will be maintained Outcome: Progressing   Problem: Pain Managment: Goal: General experience of comfort will improve and/or be controlled Outcome: Progressing   Problem: Coping: Goal: Ability to adjust to condition or change in health will improve Outcome: Not Progressing   Problem: Fluid Volume: Goal: Ability to maintain a balanced intake and output will improve Outcome: Not Progressing   Problem: Clinical Measurements: Goal: Ability to maintain clinical measurements within normal limits will improve Outcome: Not Progressing

## 2024-03-08 NOTE — Progress Notes (Signed)
 PROGRESS NOTE  Robert Burnett  DOB: 1960/10/29  PCP: Joaquin Mulberry, MD ZOX:[id number]  DOA: 03/07/2024  LOS: 1 day  Hospital Day: 2  Brief narrative: Robert Burnett is a 63 y.o. male with PMH significant for DM2, HTN, HLD, CAD/stent on Plavix , paroxysmal A-fib, CHF, diabetic neuropathy, GERD, left BKA status bladder cancer 5/19, patient presented to the ED with complaint of hematuria, dyspnea. Patient used to follow-up with Dr. Florencio Hunting at [facility] urology for bladder cancer.  He was on intravesical BCG at some point.  Currently not undergoing treatment. He developed gross hematuria in January but did not bring this to the attention of any of his providers and has had persistent hematuria since then.  Over the last several weeks, he has become increasingly weak and developed shortness of breath on exertion.    In the ED, patient was afebrile, hemodynamically stable.   He was noted to have urinary retention. Urology needed to be called for Foley insertion.  During Foley insertion, patient became severely agitated and tachycardic.  He was started on CBI. Labs showed WC count 3.6, hemoglobin 4.6 with MCV 75, BUN/creatinine 20/1.51, potassium low at 3.4, sodium low at 129 IV Rocephin  was started by urology Admitted to [facility]  Subjective: Patient was seen and examined this morning. Pleasant middle-aged Caucasian male.  Looks older for his stated age. Restless but alert, awake, oriented and able to answer questions. Events from last night and this morning noted.  Patient had clot obstruction of urine this morning which is leading to extreme discomfort.  Urology NP did over-the-wire catheter exchange and restart the irrigation. At the time of my evaluation this morning, patient had pinkish looking urine color.  Assessment and plan: Acute on chronic gross hematuria Acute urinary retention Patient has known bladder cancer and is currently not under treatment. Presented with hematuria  for last 5 months Noted to be in urinary retention.  Seen by urology.  CBI started. CBI to continue.  Urology to follow. On hyoscyamine for control of bladder spasm.  Symptomatic anemia Acute on chronic blood loss anemia Severe iron deficiency anemia 5 months of bleeding from bladder cancer led to drop in hemoglobin to 4.6. 3 units of PRBC transfusion given so far.  Hemoglobin still low at 6.5.  1 more unit PRBC transfusion ordered. Ferritin level significantly low at 3.  Will benefit from IV iron as well.  Will plan tomorrow. Continue PPI Recent Labs    07/28/23 1518 03/07/24 1506 03/08/24 0253 03/08/24 1122  HGB 12.9* 4.6* 6.3* 6.5*  MCV 79 74.6* 80.3  --   VITAMINB12  --   --  392  --   FOLATE  --   --  13.7  --   FERRITIN  --   --  3*  --   TIBC  --   --  444  --   IRON  --   --  31*  --   RETICCTPCT  --   --  1.3  --    Bladder cancer S/p TURBT 2022 Patient used to follow-up with Dr. Florencio Hunting at [facility] urology for bladder cancer.  He was on intravesical BCG at some point.  Currently not undergoing treatment. Patient states he wants to get cancer treated, not interested in palliative care  Hyponatremia Sodium level running low close to 130s.  Continue to monitor Recent Labs  Lab 03/07/24 1506 03/08/24 0253  NA 129* 130*   Hypokalemia Replacement given.  Continue to monitor Recent  Labs  Lab 03/07/24 1506 03/08/24 0253  K 3.4* 3.6  MG 2.0  --    Type 2 diabetes mellitus Peripheral neuropathy A1c 5.4 on 03/08/2024 PTA meds-Novolin  70/30 and Farxiga  Currently on SSI/Accu-Cheks Recent Labs  Lab 03/07/24 2344 03/08/24 0805 03/08/24 1140  GLUCAP 131* 121* 110*   CAD/stent No anginal symptoms.  Troponin normal Eliquis  and Plavix  on hold statin  Paroxysmal A-fib Continue Toprol  Eliquis  plan as above  CHF, HTN PTA meds- Toprol , Lasix , valsartan , Farxiga  Currently on Toprol  only.  Others on hold  Chronic daily smoker Smokes half pack per  day Continue nicotine  patch  Impaired mobility Prior left BKA status Continue pain control with Lyrica , as needed Tylenol , as needed Norco, as needed Robaxin   Mobility: Encourage ambulation.  May need PT eval after bleeding stops  Goals of care   Code Status: Full Code     DVT prophylaxis:  SCDs Start: 03/07/24 2210   Antimicrobials: None Fluid: None Consultants: Urology Family Communication: None at bedside  Status: Inpatient Level of care:  Stepdown   Patient is from: Home Needs to continue in-hospital care: Continues to have hematuria Anticipated d/c to: Hopefully home in 1 to 2 days      Diet:  Diet Order             Diet regular Room service appropriate? Yes; Fluid consistency: Thin  Diet effective now                   Scheduled Meds:  sodium chloride    Intravenous Once   sodium chloride    Intravenous Once   Chlorhexidine  Gluconate Cloth  6 each Topical Daily   feeding supplement  237 mL Oral BID BM   insulin  aspart  0-15 Units Subcutaneous TID WC   metoprolol  succinate  50 mg Oral Daily   nicotine   14 mg Transdermal Daily   [START ON 03/09/2024] pneumococcal 20-valent conjugate vaccine  0.5 mL Intramuscular Tomorrow-1000   pregabalin   100 mg Oral BID   senna  1 tablet Oral BID    PRN meds: acetaminophen  **OR** acetaminophen , HYDROcodone -acetaminophen , hyoscyamine, methocarbamol  (ROBAXIN ) injection, ondansetron  **OR** ondansetron  (ZOFRAN ) IV, mouth rinse   Infusions:   sodium chloride  irrigation      Antimicrobials: Anti-infectives (From admission, onward)    Start     Dose/Rate Route Frequency Ordered Stop   03/07/24 2015  cefTRIAXone  (ROCEPHIN ) 1 g in sodium chloride  0.9 % 100 mL IVPB        1 g 200 mL/hr over 30 Minutes Intravenous  Once 03/07/24 2001 03/07/24 2149       Objective: Vitals:   03/08/24 1239 03/08/24 1302  BP: (!) 164/86 (!) 166/78  Pulse: 76 76  Resp: (!) 23 20  Temp: 97.8 F (36.6 C) 97.8 F (36.6 C)  SpO2:  91%     Intake/Output Summary (Last 24 hours) at 03/08/2024 1325 Last data filed at 03/08/2024 1237 Gross per 24 hour  Intake 8777.5 ml  Output 16109 ml  Net -8872.5 ml   Filed Weights   03/07/24 2331  Weight: 62.7 kg   Weight change:  Body mass index is 18.24 kg/m.   Physical Exam: General exam: Pleasant, middle-aged Caucasian male Skin: No rashes, lesions or ulcers. HEENT: Atraumatic, normocephalic, no obvious bleeding Lungs: Clear to auscultation bilaterally,  CVS: S1, S2, no murmur,   GI/Abd: Soft, nontender, nondistended, bowel sound present,   CNS: Alert, awake, somewhat slow to respond, speaks soft.  Oriented x 3 Psychiatry: Mood  appropriate,  Extremities: No pedal edema, no calf tenderness,   Data Review: I have personally reviewed the laboratory data and studies available.  F/u labs ordered Unresulted Labs (From admission, onward)     Start     Ordered   03/09/24 0500  CBC with Differential/Platelet  Daily,   R     Question:  Specimen collection method  Answer:  Lab=Lab collect   03/08/24 1325   03/09/24 0500  Basic metabolic panel with GFR  Daily,   R     Question:  Specimen collection method  Answer:  Lab=Lab collect   03/08/24 1325   03/08/24 0052  MRSA Next Gen by PCR, Nasal  Once,   R        03/08/24 0051   03/07/24 2209  HIV Antibody (routine testing w rflx)  (HIV Antibody (Routine testing w reflex) panel)  Once,   R        03/07/24 2210            Signed, Hoyt Macleod, MD [facility] 03/08/2024

## 2024-03-08 NOTE — Progress Notes (Signed)
   03/08/24 0932  TOC Brief Assessment  Insurance and Status Reviewed  Patient has primary care physician Yes  Home environment has been reviewed home w/ family  Prior level of function: independent  Prior/Current Home Services No current home services  Social Drivers of Health Review SDOH reviewed no interventions necessary  Readmission risk has been reviewed Yes  Transition of care needs transition of care needs identified, TOC will continue to follow

## 2024-03-08 NOTE — Progress Notes (Signed)
 Subjective: Clot obstruction of urine early in the morning.  Patient in extreme discomfort requiring over wire catheter exchange.  Please see separate procedure note.  He fell asleep immediately after decompressing his bladder.  Objective: Vital signs in last 24 hours: Temp:  [96.6 F (35.9 C)-98.3 F (36.8 C)] 96.6 F (35.9 C) (05/20 0600) Pulse Rate:  [80-138] 120 (05/20 0632) Resp:  [14-32] 19 (05/20 0632) BP: (126-176)/(63-99) 176/82 (05/20 0865) SpO2:  [87 %-100 %] 100 % (05/20 7846) Weight:  [62.7 kg] 62.7 kg (05/19 2331)  Assessment/Plan: 49y male with gross hematuria since January.  PMH significant for left nephroureterectomy-high-grade urothelial carcinoma, TURBT, and 2 cycles of BCG before stopping due to loss of insurance-all in 2022.  Lost to follow-up since.  # Gross hematuria # Hx of bladder cancer # Clot obstruction of urine  Early a.m. large tenacious rubbery clot occluded catheter which could not be extracted.  Over wire exchange at bedside. Clot came out with foley. No further difficulty. No other clots were hand irrigated. Severe bladder spasm. Exchanged oxybutynin  for hyoscyamine Continue to hold anticoagulation CBI on low gtt. Titrate to light pink Urology will continue to follow.  Intake/Output from previous day: 05/19 0701 - 05/20 0700 In: 8385 [Blood:635; IV Piggyback:100] Out: 7900 [Urine:7900]  Intake/Output this shift: No intake/output data recorded.  Physical Exam:  General: Alert and oriented CV: No cyanosis Lungs: equal chest rise Abdomen: Soft, NTND, no rebound or guarding Gu: Three-way 65f hematuria today in place  Lab Results: Recent Labs    03/07/24 1506 03/08/24 0253  HGB 4.6* 6.3*  HCT 14.7* 19.6*   BMET Recent Labs    03/07/24 1506 03/08/24 0253  NA 129* 130*  K 3.4* 3.6  CL 90* 93*  CO2 29 23  GLUCOSE 104* 125*  BUN 20 23  CREATININE 1.51* 1.85*  CALCIUM  8.9 9.1  HGB 4.6* 6.3*  WBC 3.7* 8.0      Studies/Results: CT ABDOMEN PELVIS W CONTRAST Result Date: 03/07/2024 CLINICAL DATA:  Abdominal pain EXAM: CT ABDOMEN AND PELVIS WITH CONTRAST TECHNIQUE: Multidetector CT imaging of the abdomen and pelvis was performed using the standard protocol following bolus administration of intravenous contrast. RADIATION DOSE REDUCTION: This exam was performed according to the departmental dose-optimization program which includes automated exposure control, adjustment of the mA and/or kV according to patient size and/or use of iterative reconstruction technique. CONTRAST:  OMNIPAQUE  IOHEXOL  300 MG/ML  SOLN COMPARISON:  10/02/2020 FINDINGS: Lower chest: No acute abnormality Hepatobiliary: Small layering stones within the gallbladder. No biliary ductal dilatation. No CT evidence of acute cholecystitis. No focal hepatic abnormality. Pancreas: No focal abnormality or ductal dilatation. Spleen: No focal abnormality. Mild splenomegaly with a craniocaudal length of 13.6 cm. Adrenals/Urinary Tract: Poor prior left nephrectomy adrenal glands unremarkable. No suspicious right renal lesion. No stones or hydronephrosis. Foley catheter in the bladder. There appears to be a filling defect along the right posterior bladder wall measuring 2.5 cm. Stomach/Bowel: Stomach, large and small bowel grossly unremarkable. Vascular/Lymphatic: Diffuse aortoiliac atherosclerosis. No evidence of aneurysm or adenopathy. Reproductive: No visible focal abnormality. Other: No free fluid or free air. Musculoskeletal: No acute bony abnormality. IMPRESSION: Cholelithiasis.  No CT evidence of acute cholecystitis. Splenomegaly, similar to prior study. Soft tissue filling defect within the right posterior bladder. While this could reflect blood clot, cannot exclude bladder wall mass. Recommend urologic consultation for possible cystoscopy. Aortoiliac atherosclerosis. Electronically Signed   By: Janeece Mechanic M.D.   On: 03/07/2024  21:48   DG Chest  2 View Result Date: 03/07/2024 CLINICAL DATA:  Shortness of breath EXAM: CHEST - 2 VIEW COMPARISON:  X-ray 06/04/2022 FINDINGS: Overlapping cardiac leads. No consolidation, pneumothorax or effusion. No edema. Overlapping cardiac leads. Degenerative changes along the spine. IMPRESSION: No acute cardiopulmonary disease. Electronically Signed   By: Adrianna Horde M.D.   On: 03/07/2024 16:12      LOS: 1 day   Alla Ar, NP [facility] Pager: [phone]  03/08/2024, 7:39 AM

## 2024-03-08 NOTE — Procedures (Signed)
 Foley Catheter Placement Note  Indications: 63 y.o. male with a history of UTUC and gross hematuria. See consult note for full details.   Pre-operative Diagnosis: Urinary retention  Post-operative Diagnosis: urinary retention, BPH vs possible urethral stricture  Surgeon: Alphonza Ashing, MD  Assistants: None  Procedure Details  Patient was placed in the supine position, prepped with Betadine  and draped in the usual sterile fashion.  We injected lidocaine  jelly per urethra prior to the procedure.  I first attempted to advance a 29F coude hematuria catheter but met resistance in the fossa. I then attempted to pass a 72F coude hematuria catheter but met resistance at the prostate vs bulbar urethra. I then passed a glidewire into the bladder, followed by a 16F catheter. I removed the wire and aspirated urine, confirming bladder placement. I placed a sensor wire into the bladder and removed the 16F catheter. I then advanced the 72F coude hematuria catheter over the wire with moderate resistance at the prostate/bulbar urethra but eventually was able to enter the bladder. Wire was removed and there was return of clear merlot urine. I then proceeded to insert 10 mL of sterile water  into the Foley balloon.  The catheter was attached to a drainage bag and secured with a StatLock for  drainage of 200cc of merlot urine.   I hand irrigated with return of <10cc of small clot. The patient did not tolerate hand irrigation well due to significant bladder spasms. I started him on moderate drip CBI to keep urine clear.                 Complications: None; patient tolerated the procedure well.  Plan:   1. See consult note for details       Attending Attestation: Dr. Inga Manges was available.

## 2024-03-08 NOTE — Procedures (Addendum)
   Urology Procedure Note:  Patient is a 63 year old male in ICU for critical hemoglobin on CBI.  Please see separate progress note.  On my arrival Mr. Kenyon was in severe pain and flailing on the bed.  We briefly reviewed the proposition to exchange his Foley catheter and he quickly agreed.  I attempted to hand irrigate anything I could to provide him some relief in the short-term without success.  Patient was prepped and draped in the usual sterile fashion.  An angled zip wire was fed through the hematuria coud catheter without difficulty.  10cc of sterile water  was deflated from the balloon of the the catheter was easily removed, dragging a large very rubbery blood clot out with it, which had been causing his obstruction.  I then injected 10 cc of sterile lidocaine  infused lubricant directly into the meatus and fed another 57f three-way hematuria coud catheter over wire, advancing this into the bladder without difficulty.  There was an immediate return of 7 to 800 cc of cherry red irrigant/urine.  Having patency confirmed, the retention balloon was inflated with 30 cc of sterile water  and catheter was placed to gravity drainage without dependent loops.  Continuous bladder irrigation was restarted with good flow at full volume.  This was titrated down to a low gtt., concluding the procedure.  Procedure: insert indwelling catheter bedside   Alla Ar, NP [facility] Pager: [phone]

## 2024-03-09 ENCOUNTER — Inpatient Hospital Stay (HOSPITAL_COMMUNITY)

## 2024-03-09 DIAGNOSIS — R9431 Abnormal electrocardiogram [ECG] [EKG]: Secondary | ICD-10-CM

## 2024-03-09 DIAGNOSIS — D649 Anemia, unspecified: Secondary | ICD-10-CM | POA: Diagnosis not present

## 2024-03-09 LAB — BASIC METABOLIC PANEL WITH GFR
Anion gap: 9 (ref 5–15)
BUN: 30 mg/dL — ABNORMAL HIGH (ref 8–23)
CO2: 25 mmol/L (ref 22–32)
Calcium: 8.7 mg/dL — ABNORMAL LOW (ref 8.9–10.3)
Chloride: 93 mmol/L — ABNORMAL LOW (ref 98–111)
Creatinine, Ser: 1.97 mg/dL — ABNORMAL HIGH (ref 0.61–1.24)
GFR, Estimated: 37 mL/min — ABNORMAL LOW (ref >60)
Glucose, Bld: 116 mg/dL — ABNORMAL HIGH (ref 70–99)
Potassium: 4.7 mmol/L (ref 3.5–5.1)
Sodium: 127 mmol/L — ABNORMAL LOW (ref 135–145)

## 2024-03-09 LAB — CBC WITH DIFFERENTIAL/PLATELET
Abs Immature Granulocytes: 0.07 10*3/uL (ref 0.00–0.07)
Basophils Absolute: 0 10*3/uL (ref 0.0–0.1)
Basophils Relative: 0 %
Eosinophils Absolute: 0 10*3/uL (ref 0.0–0.5)
Eosinophils Relative: 0 %
HCT: 19.4 % — ABNORMAL LOW (ref 39.0–52.0)
Hemoglobin: 6.4 g/dL — CL (ref 13.0–17.0)
Immature Granulocytes: 1 %
Lymphocytes Relative: 10 %
Lymphs Abs: 1.4 10*3/uL (ref 0.7–4.0)
MCH: 26.1 pg (ref 26.0–34.0)
MCHC: 33 g/dL (ref 30.0–36.0)
MCV: 79.2 fL — ABNORMAL LOW (ref 80.0–100.0)
Monocytes Absolute: 1.1 10*3/uL — ABNORMAL HIGH (ref 0.1–1.0)
Monocytes Relative: 8 %
Neutro Abs: 10.9 10*3/uL — ABNORMAL HIGH (ref 1.7–7.7)
Neutrophils Relative %: 81 %
Platelets: 177 10*3/uL (ref 150–400)
RBC: 2.45 MIL/uL — ABNORMAL LOW (ref 4.22–5.81)
RDW: 16 % — ABNORMAL HIGH (ref 11.5–15.5)
WBC: 13.5 10*3/uL — ABNORMAL HIGH (ref 4.0–10.5)
nRBC: 0 % (ref 0.0–0.2)

## 2024-03-09 LAB — ECHOCARDIOGRAM COMPLETE
AR max vel: 2.36 cm2
AV Area VTI: 2.3 cm2
AV Area mean vel: 2.37 cm2
AV Mean grad: 4 mmHg
AV Peak grad: 7.4 mmHg
Ao pk vel: 1.36 m/s
Area-P 1/2: 3.61 cm2
Height: 73 in
S' Lateral: 3.4 cm
Weight: 2211.65 [oz_av]

## 2024-03-09 LAB — GLUCOSE, CAPILLARY
Glucose-Capillary: 134 mg/dL — ABNORMAL HIGH (ref 70–99)
Glucose-Capillary: 188 mg/dL — ABNORMAL HIGH (ref 70–99)
Glucose-Capillary: 184 mg/dL — ABNORMAL HIGH (ref 70–99)
Glucose-Capillary: 125 mg/dL — ABNORMAL HIGH (ref 70–99)

## 2024-03-09 LAB — CBC
HCT: 19.1 % — ABNORMAL LOW (ref 39.0–52.0)
Hemoglobin: 6.4 g/dL — CL (ref 13.0–17.0)
MCH: 27.7 pg (ref 26.0–34.0)
MCHC: 33.5 g/dL (ref 30.0–36.0)
MCV: 82.7 fL (ref 80.0–100.0)
Platelets: 129 10*3/uL — ABNORMAL LOW (ref 150–400)
RBC: 2.31 MIL/uL — ABNORMAL LOW (ref 4.22–5.81)
RDW: 18.4 % — ABNORMAL HIGH (ref 11.5–15.5)
WBC: 10.1 10*3/uL (ref 4.0–10.5)
nRBC: 0 % (ref 0.0–0.2)

## 2024-03-09 LAB — HEMOGLOBIN AND HEMATOCRIT, BLOOD
HCT: 24.3 % — ABNORMAL LOW (ref 39.0–52.0)
Hemoglobin: 8.3 g/dL — ABNORMAL LOW (ref 13.0–17.0)

## 2024-03-09 LAB — PREPARE RBC (CROSSMATCH)

## 2024-03-09 MED ORDER — IRON SUCROSE 200 MG IVPB - SIMPLE MED
200.0000 mg | Freq: Once | Status: AC
  Administered 2024-03-09: 200 mg via INTRAVENOUS
  Filled 2024-03-09: qty 200

## 2024-03-09 MED ORDER — SODIUM CHLORIDE 0.9 % IV SOLN: INTRAVENOUS | Status: DC

## 2024-03-09 MED ORDER — HYOSCYAMINE SULFATE 0.125 MG PO TBDP
0.2500 mg | ORAL_TABLET | ORAL | Status: DC | PRN
  Administered 2024-03-09 – 2024-03-13 (×11): 0.25 mg via SUBLINGUAL
  Filled 2024-03-09 (×17): qty 2

## 2024-03-09 MED ORDER — SODIUM CHLORIDE 0.9% IV SOLUTION: Freq: Once | INTRAVENOUS | Status: DC

## 2024-03-09 MED ORDER — SODIUM CHLORIDE 0.9% IV SOLUTION: Freq: Once | INTRAVENOUS | Status: AC

## 2024-03-09 NOTE — Plan of Care (Signed)
  Problem: Coping: Goal: Ability to adjust to condition or change in health will improve Outcome: Progressing   Problem: Fluid Volume: Goal: Ability to maintain a balanced intake and output will improve Outcome: Progressing   Problem: Metabolic: Goal: Ability to maintain appropriate glucose levels will improve Outcome: Progressing   Problem: Nutritional: Goal: Maintenance of adequate nutrition will improve Outcome: Progressing   Problem: Clinical Measurements: Goal: Ability to maintain clinical measurements within normal limits will improve Outcome: Progressing

## 2024-03-09 NOTE — Progress Notes (Signed)
 PROGRESS NOTE  Robert Burnett  DOB: 04-16-1961  PCP: Joaquin Mulberry, MD ZOX:[id number]  DOA: 03/07/2024  LOS: 2 days  Hospital Day: 3  Brief narrative: Robert Burnett is a 63 y.o. male with PMH significant for DM2, HTN, HLD, CAD/stent on Plavix , paroxysmal A-fib, CHF, diabetic neuropathy, GERD, left BKA status bladder cancer 5/19, patient presented to the ED with complaint of hematuria, dyspnea. Patient used to follow-up with Dr. Florencio Hunting at [facility] urology for bladder cancer.  He was on intravesical BCG at some point.  Currently not undergoing treatment.  He developed gross hematuria in January but did not bring this to the attention of any of his providers and has had persistent hematuria since then.  Over the last several weeks, he has become increasingly weak and developed shortness of breath on exertion.    In the ED, patient was afebrile, hemodynamically stable.   He was noted to have urinary retention. Urology needed to be called for Foley insertion.  During Foley insertion, patient became severely agitated and tachycardic.  He was started on CBI. Labs showed WC count 3.6, hemoglobin 4.6 with MCV 75, BUN/creatinine 20/1.51, potassium low at 3.4, sodium low at 129 IV Rocephin  was started by urology Admitted to [facility]  Subjective: Patient was seen and examined this morning. Lying down in bed.  Alert, awake, oriented x 3.  Able to have a meaningful conversation.  No family at bedside. Continues to have hematuria and drop in hemoglobin noted again this morning.  Assessment and plan: Acute on chronic gross hematuria Acute urinary retention Patient has known bladder cancer and is currently not under treatment. Presented with hematuria for last 5 months Noted to be in urinary retention.  Seen by urology.  CBI started. Continues to have hematuria CBI to continue.  Urology following On hyoscyamine for control of bladder spasm.  Symptomatic anemia Acute on chronic blood  loss anemia Severe iron deficiency anemia 5 months of bleeding from bladder cancer led to drop in hemoglobin to 4.6.  Receiving blood transfusions. 1 more unit of PRBC given this morning for hemoglobin of 6.4.   4 units of PRBC transfusion given so far.  Repeat hemoglobin after transfusion today  Ferritin level significantly low at 3.  Ordered IV Venofer. Continue PPI Recent Labs    03/07/24 1506 03/08/24 0253 03/08/24 1122 03/08/24 1710 03/09/24 0304  HGB 4.6* 6.3* 6.5* 7.2* 6.4*  MCV 74.6* 80.3  --   --  79.2*  VITAMINB12  --  392  --   --   --   FOLATE  --  13.7  --   --   --   FERRITIN  --  3*  --   --   --   TIBC  --  444  --   --   --   IRON  --  31*  --   --   --   RETICCTPCT  --  1.3  --   --   --    Bladder cancer S/p TURBT 2022 Patient used to follow-up with Dr. Florencio Hunting at [facility] urology for bladder cancer.  He was on intravesical BCG at some point.  Currently not undergoing treatment. Patient states he wants to get cancer treated 5/21, seen by urologist Dr. Rozanne Corners.  Tentative plan of cystoscopy and TURBT next week  Hyponatremia Sodium level running low close to 130s.  Continue to monitor Recent Labs  Lab 03/07/24 1506 03/08/24 0253 03/09/24 0304  NA 129* 130* 127*  Hypokalemia Replacement given.  Continue to monitor Recent Labs  Lab 03/07/24 1506 03/08/24 0253 03/09/24 0304  K 3.4* 3.6 4.7  MG 2.0  --   --    Type 2 diabetes mellitus Peripheral neuropathy A1c 5.4 on 03/08/2024 PTA meds-Novolin  70/30 and Farxiga  Currently on SSI/Accu-Cheks Recent Labs  Lab 03/08/24 1140 03/08/24 1546 03/08/24 2059 03/09/24 0829 03/09/24 1222  GLUCAP 110* 161* 155* 134* 188*   CAD H/o MI 05/2022 s/p stent No anginal symptoms.  Troponin normal Eliquis  and Plavix  on hold statin  Paroxysmal A-fib Continue Toprol  Eliquis  plan as above  History of systolic CHF HTN PTA meds- Toprol , Lasix , valsartan , Farxiga  Currently on Toprol  only.  Others on  hold Repeat echocardiogram  Chronic daily smoker Smokes half pack per day Continue nicotine  patch  Impaired mobility Prior left BKA status Continue pain control with Lyrica , as needed Tylenol , as needed Norco, as needed Robaxin   Mobility: Encourage ambulation.  May need PT eval after bleeding stops  Goals of care   Code Status: Full Code     DVT prophylaxis:  SCDs Start: 03/07/24 2210   Antimicrobials: None Fluid: None Consultants: Urology Family Communication: None at bedside  Status: Inpatient Level of care:  Stepdown   Patient is from: Home Needs to continue in-hospital care: Continues to have hematuria Anticipated d/c to: Hopefully home in 1 to 2 days      Diet:  Diet Order             Diet regular Room service appropriate? Yes; Fluid consistency: Thin  Diet effective now                   Scheduled Meds:  sodium chloride    Intravenous Once   Chlorhexidine  Gluconate Cloth  6 each Topical Daily   feeding supplement  237 mL Oral BID BM   insulin  aspart  0-15 Units Subcutaneous TID WC   metoprolol  succinate  50 mg Oral Daily   nicotine   14 mg Transdermal Daily   pneumococcal 20-valent conjugate vaccine  0.5 mL Intramuscular Tomorrow-1000   pregabalin   100 mg Oral BID   senna  1 tablet Oral BID    PRN meds: acetaminophen  **OR** acetaminophen , HYDROcodone -acetaminophen , hyoscyamine, methocarbamol  (ROBAXIN ) injection, morphine  injection, ondansetron  **OR** ondansetron  (ZOFRAN ) IV, mouth rinse, polyethylene glycol   Infusions:   sodium chloride  75 mL/hr at 03/09/24 1100   iron sucrose     sodium chloride  irrigation      Antimicrobials: Anti-infectives (From admission, onward)    Start     Dose/Rate Route Frequency Ordered Stop   03/07/24 2015  cefTRIAXone  (ROCEPHIN ) 1 g in sodium chloride  0.9 % 100 mL IVPB        1 g 200 mL/hr over 30 Minutes Intravenous  Once 03/07/24 2001 03/07/24 2149       Objective: Vitals:   03/09/24 1102 03/09/24  1200  BP: 102/63   Pulse: 86   Resp: (!) 24   Temp:  98.8 F (37.1 C)  SpO2: 100%     Intake/Output Summary (Last 24 hours) at 03/09/2024 1256 Last data filed at 03/09/2024 1100 Gross per 24 hour  Intake 10007.14 ml  Output 40981 ml  Net -37242.86 ml   Filed Weights   03/07/24 2331  Weight: 62.7 kg   Weight change:  Body mass index is 18.24 kg/m.   Physical Exam: General exam: Pleasant, middle-aged Caucasian male.  Foley catheter with red urine. Skin: No rashes, lesions or ulcers. HEENT: Atraumatic, normocephalic, no  obvious bleeding Lungs: Clear to auscultation bilaterally CVS: S1, S2, no murmur,   GI/Abd: Soft, nontender, nondistended, bowel sound present,   CNS: Alert, awake, oriented x 3.   Psychiatry: Mood appropriate,  Extremities: No pedal edema, no calf tenderness,   Data Review: I have personally reviewed the laboratory data and studies available.  F/u labs ordered Unresulted Labs (From admission, onward)     Start     Ordered   03/09/24 1130  CBC  Once-Timed,   TIMED       Question:  Specimen collection method  Answer:  Lab=Lab collect   03/09/24 1023   03/09/24 0500  CBC with Differential/Platelet  Daily,   R     Question:  Specimen collection method  Answer:  Lab=Lab collect   03/08/24 1325   03/09/24 0500  Basic metabolic panel with GFR  Daily,   R     Question:  Specimen collection method  Answer:  Lab=Lab collect   03/08/24 1325   03/08/24 0052  MRSA Next Gen by PCR, Nasal  Once,   R        03/08/24 0051            Signed, Hoyt Macleod, MD [facility] 03/09/2024

## 2024-03-09 NOTE — Progress Notes (Signed)
 PT Cancellation Note  Patient Details Name: Robert Burnett MRN: [id number] DOB: 06/01/1961   Cancelled Treatment:    Reason Eval/Treat Not Completed: Fatigue/lethargy limiting ability to participate;Medical issues which prohibited therapy, resting in bed , recently received pain meds and completed transfusion. Will check back another time.  Abelina Hoes PT Acute Rehabilitation Services Office [phone]      Dareen Ebbing 03/09/2024, 11:15 AM

## 2024-03-09 NOTE — Plan of Care (Signed)

## 2024-03-09 NOTE — Progress Notes (Signed)
 Patient ID: Robert Burnett, male   DOB: 06-05-1961, 63 y.o.   MRN: [id number]    Subjective: Pt admitted Monday night for hematuria, clot retention, and severe anemia with Hgb < 5.  Required hematuria catheter placement and clot irrigation and now on CBI.  Has received multiple transfusions.  He apparently has been having intermittent hematuria for one year but did not call our office.  Robert Burnett is well known to me. 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  He then had recurrence with 6 small tumors in the bladder and is s/p TURBT in November 2022.  All tumors were low grade, Ta tumors.  Due to his intermediate risk bladder cancer, he was recommended to undergo a 6 week course of adjuvant intravesical BCG.  He completed 2 weeks of treatment and then refused further treatments and follow up upper tract imaging due to loss of insurance.  He did undergo office cystoscopy in March 2023 and had a negative cystoscopy and negative urine cytology.  He was recommended to continue cystoscopic surveillance every 3 months but did not follow up as recommended.  Since his last visit with me in March 2023, he apparently had a myocardial infarction in August 2023 requiring drug eluting stent placement to his LAD.  His EF at that time was estimated at 35% and he was also started on Eliquis  due to paroxysmal atrial fibrillation.  He last took Eliquis  and Plavix  Monday morning.  Objective: Vital signs in last 24 hours: Temp:  [97.3 F (36.3 C)-98.2 F (36.8 C)] 97.4 F (36.3 C) (05/21 0647) Pulse Rate:  [73-87] 74 (05/21 0647) Resp:  [13-29] 13 (05/21 0647) BP: (107-166)/(58-91) 121/67 (05/21 0647) SpO2:  [91 %-100 %] 99 % (05/21 7829)  Intake/Output from previous day: 05/20 0701 - 05/21 0700 In: 771.1 [I.V.:33.7; Blood:737.4] Out: 56213 [Urine:49950] Intake/Output this shift: No intake/output data recorded.  Physical Exam:   General: Alert and oriented GU: I hand irrigated his catheter and no clots noted.  CBI running briskly to keep urine light pink.  Lab Results: Recent Labs    03/08/24 1122 03/08/24 1710 03/09/24 0304  HGB 6.5* 7.2* 6.4*  HCT 19.9* 22.2* 19.4*   BMET Recent Labs    03/08/24 0253 03/09/24 0304  NA 130* 127*  K 3.6 4.7  CL 93* 93*  CO2 23 25  GLUCOSE 125* 116*  BUN 23 30*  CREATININE 1.85* 1.97*  CALCIUM  9.1 8.7*     Studies/Results: CT ABDOMEN PELVIS W CONTRAST Result Date: 03/07/2024 CLINICAL DATA:  Abdominal pain EXAM: CT ABDOMEN AND PELVIS WITH CONTRAST TECHNIQUE: Multidetector CT imaging of the abdomen and pelvis was performed using the standard protocol following bolus administration of intravenous contrast. RADIATION DOSE REDUCTION: This exam was performed according to the departmental dose-optimization program which includes automated exposure control, adjustment of the mA and/or kV according to patient size and/or use of iterative reconstruction technique. CONTRAST:  OMNIPAQUE  IOHEXOL  300 MG/ML  SOLN COMPARISON:  10/02/2020 FINDINGS: Lower chest: No acute abnormality Hepatobiliary: Small layering stones within the gallbladder. No biliary ductal dilatation. No CT evidence of acute cholecystitis. No focal hepatic abnormality. Pancreas: No focal abnormality or ductal dilatation. Spleen: No focal abnormality. Mild splenomegaly with a craniocaudal length of 13.6 cm. Adrenals/Urinary Tract: Poor prior left nephrectomy adrenal glands unremarkable. No suspicious right renal lesion. No stones or hydronephrosis. Foley catheter in the bladder. There appears to be a  filling defect along the right posterior bladder wall measuring 2.5 cm. Stomach/Bowel: Stomach, large and small bowel grossly unremarkable. Vascular/Lymphatic: Diffuse aortoiliac atherosclerosis. No evidence of aneurysm or adenopathy. Reproductive: No visible focal abnormality. Other: No free fluid or free air.  Musculoskeletal: No acute bony abnormality. IMPRESSION: Cholelithiasis.  No CT evidence of acute cholecystitis. Splenomegaly, similar to prior study. Soft tissue filling defect within the right posterior bladder. While this could reflect blood clot, cannot exclude bladder wall mass. Recommend urologic consultation for possible cystoscopy. Aortoiliac atherosclerosis. Electronically Signed   By: Janeece Mechanic M.D.   On: 03/07/2024 21:48   DG Chest 2 View Result Date: 03/07/2024 CLINICAL DATA:  Shortness of breath EXAM: CHEST - 2 VIEW COMPARISON:  X-ray 06/04/2022 FINDINGS: Overlapping cardiac leads. No consolidation, pneumothorax or effusion. No edema. Overlapping cardiac leads. Degenerative changes along the spine. IMPRESSION: No acute cardiopulmonary disease. Electronically Signed   By: Adrianna Horde M.D.   On: 03/07/2024 16:12    Assessment/Plan: 1) Hematuria: Continue to hold Plavix  and Eliquis  and transfuse as indicated.  He will ultimately need cystoscopy and TURBT for what is likely a recurrent bladder tumor but this cannot be performed until he is off Plavix  for > 5 days.  Assuming his urine clears and bleeding stops, we can consider doing this next week or as an outpatient.  It would be beneficial for cardiology to see him to assess his postoperative risk with a history of EF of 35% and no recent care.  Would a repeat echocardiogram be indicated at this time?  Appreciate their input to assess safety of proceeding with surgical intervention with general anesthesia.  2) AKI: Baseline Cr 1.1 last fall.  Now 2.0.  Likely multifactorial with contrast load at admission, severe anemia, etc.  Do not suspect obstructive etiology considering CT imaging.   LOS: 2 days   Kristeen Peto 03/09/2024, 7:38 AM

## 2024-03-10 ENCOUNTER — Encounter: Payer: Self-pay | Admitting: Pulmonary Disease

## 2024-03-10 ENCOUNTER — Telehealth: Payer: Self-pay | Admitting: Family Medicine

## 2024-03-10 ENCOUNTER — Telehealth: Payer: Self-pay | Admitting: Internal Medicine

## 2024-03-10 DIAGNOSIS — D649 Anemia, unspecified: Secondary | ICD-10-CM | POA: Diagnosis not present

## 2024-03-10 DIAGNOSIS — D500 Iron deficiency anemia secondary to blood loss (chronic): Secondary | ICD-10-CM | POA: Insufficient documentation

## 2024-03-10 DIAGNOSIS — E43 Unspecified severe protein-calorie malnutrition: Secondary | ICD-10-CM | POA: Insufficient documentation

## 2024-03-10 LAB — TYPE AND SCREEN
ABO/RH(D): AB POS
Antibody Screen: NEGATIVE
Unit division: 0
Unit division: 0
Unit division: 0
Unit division: 0
Unit division: 0
Unit division: 0
Unit division: 0

## 2024-03-10 LAB — CBC WITH DIFFERENTIAL/PLATELET
Abs Immature Granulocytes: 0.03 10*3/uL (ref 0.00–0.07)
Basophils Absolute: 0 10*3/uL (ref 0.0–0.1)
Basophils Relative: 0 %
Eosinophils Absolute: 0 10*3/uL (ref 0.0–0.5)
Eosinophils Relative: 0 %
HCT: 24.2 % — ABNORMAL LOW (ref 39.0–52.0)
Hemoglobin: 7.9 g/dL — ABNORMAL LOW (ref 13.0–17.0)
Immature Granulocytes: 1 %
Lymphocytes Relative: 18 %
Lymphs Abs: 1.1 10*3/uL (ref 0.7–4.0)
MCH: 28.4 pg (ref 26.0–34.0)
MCHC: 32.6 g/dL (ref 30.0–36.0)
MCV: 87.1 fL (ref 80.0–100.0)
Monocytes Absolute: 0.5 10*3/uL (ref 0.1–1.0)
Monocytes Relative: 9 %
Neutro Abs: 4.4 10*3/uL (ref 1.7–7.7)
Neutrophils Relative %: 72 %
Platelets: 101 10*3/uL — ABNORMAL LOW (ref 150–400)
RBC: 2.78 MIL/uL — ABNORMAL LOW (ref 4.22–5.81)
RDW: 17.5 % — ABNORMAL HIGH (ref 11.5–15.5)
WBC: 6.1 10*3/uL (ref 4.0–10.5)
nRBC: 0 % (ref 0.0–0.2)

## 2024-03-10 LAB — BPAM RBC
Blood Product Expiration Date: 202506162359
Blood Product Expiration Date: 202506162359
Blood Product Expiration Date: 202506162359
Blood Product Expiration Date: 202506162359
Blood Product Expiration Date: 202506172359
Blood Product Expiration Date: 202506192359
Blood Product Expiration Date: 202506192359
ISSUE DATE / TIME: 202505191840
ISSUE DATE / TIME: 202505192123
ISSUE DATE / TIME: 202505200542
ISSUE DATE / TIME: 202505201242
ISSUE DATE / TIME: 202505210626
ISSUE DATE / TIME: 202505211359
ISSUE DATE / TIME: 202505211717
Unit Type and Rh: 6200
Unit Type and Rh: 6200
Unit Type and Rh: 6200
Unit Type and Rh: 6200
Unit Type and Rh: 6200
Unit Type and Rh: 6200
Unit Type and Rh: 6200

## 2024-03-10 LAB — BASIC METABOLIC PANEL WITH GFR
Anion gap: 7 (ref 5–15)
BUN: 24 mg/dL — ABNORMAL HIGH (ref 8–23)
CO2: 23 mmol/L (ref 22–32)
Calcium: 7.9 mg/dL — ABNORMAL LOW (ref 8.9–10.3)
Chloride: 96 mmol/L — ABNORMAL LOW (ref 98–111)
Creatinine, Ser: 1.54 mg/dL — ABNORMAL HIGH (ref 0.61–1.24)
GFR, Estimated: 50 mL/min — ABNORMAL LOW (ref >60)
Glucose, Bld: 115 mg/dL — ABNORMAL HIGH (ref 70–99)
Potassium: 4.4 mmol/L (ref 3.5–5.1)
Sodium: 126 mmol/L — ABNORMAL LOW (ref 135–145)

## 2024-03-10 LAB — MRSA NEXT GEN BY PCR, NASAL: MRSA by PCR Next Gen: NOT DETECTED

## 2024-03-10 LAB — GLUCOSE, CAPILLARY
Glucose-Capillary: 146 mg/dL — ABNORMAL HIGH (ref 70–99)
Glucose-Capillary: 207 mg/dL — ABNORMAL HIGH (ref 70–99)
Glucose-Capillary: 178 mg/dL — ABNORMAL HIGH (ref 70–99)
Glucose-Capillary: 183 mg/dL — ABNORMAL HIGH (ref 70–99)

## 2024-03-10 LAB — OSMOLALITY: Osmolality: 286 mosm/kg (ref 275–295)

## 2024-03-10 LAB — SODIUM, URINE, RANDOM: Sodium, Ur: 127 mmol/L

## 2024-03-10 LAB — OSMOLALITY, URINE: Osmolality, Ur: 267 mosm/kg — ABNORMAL LOW (ref 300–900)

## 2024-03-10 MED ORDER — ENSURE ENLIVE PO LIQD
237.0000 mL | Freq: Three times a day (TID) | ORAL | Status: DC
  Administered 2024-03-10 – 2024-03-15 (×11): 237 mL via ORAL

## 2024-03-10 MED ORDER — LORATADINE 10 MG PO TABS
10.0000 mg | ORAL_TABLET | Freq: Every day | ORAL | Status: DC | PRN
  Administered 2024-03-10: 10 mg via ORAL
  Filled 2024-03-10: qty 1

## 2024-03-10 MED ORDER — INSULIN GLARGINE-YFGN 100 UNIT/ML ~~LOC~~ SOLN
5.0000 [IU] | Freq: Every day | SUBCUTANEOUS | Status: DC
  Administered 2024-03-10 – 2024-03-15 (×6): 5 [IU] via SUBCUTANEOUS
  Filled 2024-03-10 (×7): qty 0.05

## 2024-03-10 MED ORDER — INSULIN ASPART 100 UNIT/ML IJ SOLN
0.0000 [IU] | Freq: Three times a day (TID) | INTRAMUSCULAR | Status: DC
  Administered 2024-03-10: 2 [IU] via SUBCUTANEOUS
  Administered 2024-03-11: 1 [IU] via SUBCUTANEOUS
  Administered 2024-03-11 – 2024-03-12 (×2): 2 [IU] via SUBCUTANEOUS
  Administered 2024-03-12: 3 [IU] via SUBCUTANEOUS
  Administered 2024-03-13 – 2024-03-14 (×5): 1 [IU] via SUBCUTANEOUS
  Administered 2024-03-15: 2 [IU] via SUBCUTANEOUS
  Administered 2024-03-15: 1 [IU] via SUBCUTANEOUS

## 2024-03-10 MED ORDER — INSULIN GLARGINE-YFGN 100 UNIT/ML ~~LOC~~ SOPN
5.0000 [IU] | PEN_INJECTOR | Freq: Every day | SUBCUTANEOUS | Status: DC
  Filled 2024-03-10: qty 3

## 2024-03-10 MED ORDER — ADULT MULTIVITAMIN W/MINERALS CH
1.0000 | ORAL_TABLET | Freq: Every day | ORAL | Status: DC
  Administered 2024-03-11 – 2024-03-15 (×5): 1 via ORAL
  Filled 2024-03-10 (×5): qty 1

## 2024-03-10 MED ORDER — INSULIN ASPART 100 UNIT/ML IJ SOLN
0.0000 [IU] | Freq: Every day | INTRAMUSCULAR | Status: DC
Start: 2024-03-10 — End: 2024-03-15

## 2024-03-10 NOTE — Progress Notes (Signed)
 Initial Nutrition Assessment  DOCUMENTATION CODES:   Severe malnutrition in context of chronic illness  INTERVENTION:  - Regular diet.  - Ensure Plus High Protein po TID, each supplement provides 350 kcal and 20 grams of protein. - Multivitamin with minerals daily - Monitor weight trends.   NUTRITION DIAGNOSIS:   Severe Malnutrition related to chronic illness (bladder cancer; CHF) as evidenced by severe fat depletion, severe muscle depletion.  GOAL:  Patient will meet greater than or equal to 90% of their needs  MONITOR:   PO intake, Supplement acceptance, Weight trends  REASON FOR ASSESSMENT:   Malnutrition Screening Tool    ASSESSMENT:   63 y.o. male with PMH significant for bladder cancer, DM2, HTN, HLD, CAD/stent, paroxysmal A-fib, CHF, diabetic neuropathy, GERD, left BKA who presented  with complaint of hematuria, dyspnea. Admitted for acute on chronic gross hematuria and anemia.  Patient reports a UBW of 190# but notes that over several months to years to has had bad arthritis pain that has affected his appetite and caused him to eat less and therefore lose weight. However, he also reports that the past 1.5 months PTA he has had very bad vomiting and has not been able to keep anything down, so his weight has dropped significantly.  Per EMR, last weight PTA is back from December 2023 at which time patient weighed 179#. He was weighed this admission at 138#. This shows a 41# or 23% weight loss within the past ~1.5 years. Suspect this weight loss occurred within a more significant time frame but unable to verify with limited weight history.   Patient shares that during the past few months when he was experiencing so much vomiting, he was not able to eat or keep a lot of food down. Even foods he would normally enjoy he didn't really care for. He found that the only foods he could really tolerate and consistently eat was cereal, cheese puffs, and ice cream.   Thankfully, he  reports good appetite now and that he has not been vomiting anymore. Endorses eating a good breakfast of toast, pineapple, a couple potato chips, and orange juice. He was given an Ensure this AM and reports enjoying it and tolerating it.   Encouraged intake of frequent meals and snacks during the day. He is agreeable to receive Ensure TID to support intake. Patient has a desire to gain some weight back so is motivated to eat well.    Medications reviewed and include: Senokot  Labs reviewed:  Na 126 Creatinine 1.54 HA1C 5.4   NUTRITION - FOCUSED PHYSICAL EXAM:  Flowsheet Row Most Recent Value  Orbital Region Moderate depletion  Upper Arm Region Severe depletion  Thoracic and Lumbar Region Severe depletion  Buccal Region Severe depletion  Temple Region Moderate depletion  Clavicle Bone Region Severe depletion  Clavicle and Acromion Bone Region Severe depletion  Scapular Bone Region Unable to assess  Dorsal Hand Severe depletion  Patellar Region Moderate depletion  Anterior Thigh Region Moderate depletion  Posterior Calf Region Severe depletion  [L BKA]  Edema (RD Assessment) None  Hair Reviewed  Eyes Reviewed  Mouth Reviewed  Skin Reviewed  Nails Reviewed       Diet Order:   Diet Order             Diet regular Room service appropriate? Yes; Fluid consistency: Thin  Diet effective now                   EDUCATION NEEDS:  Education needs have been addressed  Skin:  Skin Assessment: Reviewed RN Assessment  Last BM:  5/19  Height:  Ht Readings from Last 1 Encounters:  03/07/24 6\' 1"  (1.854 m)   Weight:  Wt Readings from Last 1 Encounters:  03/07/24 62.7 kg    BMI:  Body mass index is 18.24 kg/m.  Estimated Nutritional Needs:  Kcal:  1900-2200 kcals Protein:  95-115 grams Fluid:  >/= 1.9L    Scheryl Cushing RD, LDN Contact via Secure Chat.

## 2024-03-10 NOTE — Plan of Care (Signed)
  Problem: Coping: Goal: Ability to adjust to condition or change in health will improve Outcome: Progressing   Problem: Fluid Volume: Goal: Ability to maintain a balanced intake and output will improve Outcome: Progressing   Problem: Health Behavior/Discharge Planning: Goal: Ability to manage health-related needs will improve Outcome: Progressing

## 2024-03-10 NOTE — Telephone Encounter (Signed)
 FYI

## 2024-03-10 NOTE — Evaluation (Signed)
 Physical Therapy Evaluation Patient Details Name: Robert Burnett MRN: [id number] DOB: 1961-07-11 Today's Date: 03/10/2024  History of Present Illness  Robert Burnett is a 63 y.o. male presented to the ED 03/07/24 with complaint of hematuria, dyspnea. PMH significant for DM2, HTN, HLD, CAD/stent on Plavix , paroxysmal A-fib, CHF, diabetic neuropathy, GERD, left BKA status bladder cancer  Clinical Impression  Pt admitted with above diagnosis.  Pt currently with functional limitations due to the deficits listed below (see PT Problem List). Pt will benefit from acute skilled PT to increase their independence and safety with mobility to allow discharge.     The patient is very pleasant. Patient able to mobilize to sitting and  stand pivot transfer to recliner with supervision. Patient should progress to return home.  Patient reports being modified independent at [facility] level and short distance ambulation with RW. Patient reports having very supportive neighbors.  Patient should progress to return home.      If plan is discharge home, recommend the following: Assist for transportation;Help with stairs or ramp for entrance   Can travel by private vehicle        Equipment Recommendations None recommended by PT  Recommendations for Other Services       Functional Status Assessment Patient has had a recent decline in their functional status and demonstrates the ability to make significant improvements in function in a reasonable and predictable amount of time.     Precautions / Restrictions Precautions Precautions: Fall Restrictions Weight Bearing Restrictions Per Provider Order: No Other Position/Activity Restrictions: LBKA      Mobility  Bed Mobility Overal bed mobility: Independent                  Transfers Overall transfer level: Needs assistance Equipment used: None Transfers: Sit to/from Stand, Bed to chair/wheelchair/BSC Sit to Stand: Supervision Stand pivot transfers:  Supervision         General transfer comment: stands, reaches for armrest and turns to pivot    Ambulation/Gait                  Stairs            Wheelchair Mobility     Tilt Bed    Modified Rankin (Stroke Patients Only)       Balance Overall balance assessment: Needs assistance Sitting-balance support: Feet supported, No upper extremity supported Sitting balance-[name]: Good     Standing balance support: During functional activity, Bilateral upper extremity supported Standing balance-[name]: Fair                               Pertinent Vitals/Pain Pain Assessment Pain Assessment: Faces Pain Location: all over, back especially Pain Descriptors / Indicators: Aching, Discomfort, Grimacing, Guarding Pain Intervention(s): Monitored during session, Premedicated before session    Home Living Family/patient expects to be discharged to:: Private residence Living Arrangements: Other relatives Available Help at Discharge: Family Type of Home: House Home Access: Ramped entrance       Home Layout: One level Home Equipment: Wheelchair - Forensic psychologist (2 wheels);Tub bench Additional Comments: nephew lives w pt but does not assist pt at all    Prior Function Prior Level of Function : Independent/Modified Independent;Driving             Mobility Comments: has to use Rw into BR, uses WC in house O/W ADLs Comments: uses a tub bench  Extremity/Trunk Assessment   Upper Extremity Assessment Upper Extremity Assessment: Overall WFL for tasks assessed    Lower Extremity Assessment Lower Extremity Assessment: LLE deficits/detail LLE Deficits / Details: BKA    Cervical / Trunk Assessment Cervical / Trunk Assessment: Kyphotic  Communication   Communication Communication: No apparent difficulties    Cognition Arousal: Alert Behavior During Therapy: WFL for tasks assessed/performed   PT - Cognitive impairments: No  apparent impairments                                 Cueing       General Comments      Exercises     Assessment/Plan    PT Assessment Patient needs continued PT services  PT Problem List Decreased strength;Decreased knowledge of use of DME;Decreased mobility;Pain       PT Treatment Interventions DME instruction;Therapeutic exercise;Gait training;Functional mobility training;Therapeutic activities;Patient/family education;Wheelchair mobility training    PT Goals (Current goals can be found in the Care Plan section)  Acute Rehab PT Goals Patient Stated Goal: go home PT Goal Formulation: With patient Time For Goal Achievement: 03/24/24 Potential to Achieve Goals: Good    Frequency Min 3X/week     Co-evaluation               AM-PAC PT "6 Clicks" Mobility  Outcome Measure Help needed turning from your back to your side while in a flat bed without using bedrails?: None Help needed moving from lying on your back to sitting on the side of a flat bed without using bedrails?: None Help needed moving to and from a bed to a chair (including a wheelchair)?: None Help needed standing up from a chair using your arms (e.g., wheelchair or bedside chair)?: A Little Help needed to walk in hospital room?: Total Help needed climbing 3-5 steps with a railing? : Total 6 Click Score: 17    End of Session   Activity Tolerance: Patient tolerated treatment well Patient left: in chair;with call bell/phone within reach Nurse Communication: Mobility status PT Visit Diagnosis: Unsteadiness on feet (R26.81);Difficulty in walking, not elsewhere classified (R26.2);Pain    Time: 1425-1443 PT Time Calculation (min) (ACUTE ONLY): 18 min   Charges:   PT Evaluation $PT Eval Low Complexity: 1 Low   PT General Charges $$ ACUTE PT VISIT: 1 Visit         Abelina Hoes PT Acute Rehabilitation Services Office [phone]  Dareen Ebbing 03/10/2024, 4:06 PM

## 2024-03-10 NOTE — Telephone Encounter (Signed)
 Patient referred to infusion pharmacy team for ambulatory infusion of IV iron.  Insurance - [place] [employer] Prepaid   Dx code - D50.0/D64.9 IV Iron Therapy - Feraheme 510 mg Iv x2  Infusion appointments - [employer] team will schedule patient as soon as possible.   Shabana Armentrout D. Abigaelle Verley, PharmD

## 2024-03-10 NOTE — Progress Notes (Signed)
 PROGRESS NOTE  Robert Burnett  DOB: Sep 02, 1961  PCP: Joaquin Mulberry, MD OZH:[id number]  DOA: 03/07/2024  LOS: 3 days  Hospital Day: 4  Brief narrative: Robert Burnett is a 63 y.o. male with PMH significant for DM2, HTN, HLD, CAD/stent on Plavix , paroxysmal A-fib, CHF, diabetic neuropathy, GERD, left BKA status bladder cancer 5/19, patient presented to the ED with complaint of hematuria, dyspnea. Patient used to follow-up with Dr. Florencio Hunting at [facility] urology for bladder cancer.  He was on intravesical BCG at some point.  Currently not undergoing treatment.  He developed gross hematuria in January but did not bring this to the attention of any of his providers and has had persistent hematuria since then.  Over the last several weeks, he has become increasingly weak and developed shortness of breath on exertion.    In the ED, patient was afebrile, hemodynamically stable.   He was noted to have urinary retention. Urology needed to be called for Foley insertion.  During Foley insertion, patient became severely agitated and tachycardic.  He was started on CBI. Labs showed WC count 3.6, hemoglobin 4.6 with MCV 75, BUN/creatinine 20/1.51, potassium low at 3.4, sodium low at 129 IV Rocephin  was started by urology Admitted to [facility]  Subjective: Patient was seen and examined this morning. Propped up in bed.  Not in distress.  Continues to have hematuria.  Otherwise feels great.  Overall  Assessment and plan: Acute on chronic gross hematuria Acute urinary retention Patient has known bladder cancer and is currently not under treatment. Presented with hematuria for last 5 months Noted to be in urinary retention.  Seen by urology.  CBI started. Continues to have hematuria.  Continues CBI. On hyoscyamine for control of bladder spasm.   Urology following  Symptomatic anemia Acute on chronic blood loss anemia Severe iron deficiency anemia 5 months of bleeding from bladder cancer led to  drop in hemoglobin to 4.6.  Receiving blood transfusions. 5 units of PRBC transfusion given so far. Hemoglobin 7.9 this morning.  Continue to monitor. IV iron given. Continue PPI Recent Labs    03/08/24 0253 03/08/24 1122 03/08/24 1710 03/09/24 0304 03/09/24 1145 03/09/24 2230 03/10/24 0300  HGB 6.3*   < > 7.2* 6.4* 6.4* 8.3* 7.9*  MCV 80.3  --   --  79.2* 82.7  --  87.1  VITAMINB12 392  --   --   --   --   --   --   FOLATE 13.7  --   --   --   --   --   --   FERRITIN 3*  --   --   --   --   --   --   TIBC 444  --   --   --   --   --   --   IRON 31*  --   --   --   --   --   --   RETICCTPCT 1.3  --   --   --   --   --   --    < > = values in this interval not displayed.   Bladder cancer S/p TURBT 2022 Patient used to follow-up with Dr. Florencio Hunting at [facility] urology for bladder cancer.  He was on intravesical BCG at some point.  Currently not undergoing treatment. Patient states he wants to get cancer treated 5/21, seen by urologist Dr. Rozanne Corners.  Tentative plan of cystoscopy and TURBT next week inpatient versus  outpatient.  Hyponatremia Sodium level downtrending. I would hold IV hydration.  Hold order urine osmolality and urine sodium level but unclear if urine studies would be correct given significant hematuria. Recent Labs  Lab 03/07/24 1506 03/08/24 0253 03/09/24 0304 03/10/24 0300  NA 129* 130* 127* 126*   Type 2 diabetes mellitus Peripheral neuropathy A1c 5.4 on 03/08/2024 PTA meds-Novolin  70/30 and Farxiga  Currently on SSI/Accu-Cheks.  Add Semglee  daily Recent Labs  Lab 03/09/24 1222 03/09/24 1631 03/09/24 2139 03/10/24 0805 03/10/24 1153  GLUCAP 188* 184* 125* 146* 207*   CAD H/o MI 05/2022 s/p stent No anginal symptoms.  Troponin normal Eliquis  and Plavix  on hold statin  Paroxysmal A-fib Continue Toprol  Eliquis  plan as above  History of systolic CHF HTN PTA meds- Toprol , Lasix , valsartan , Farxiga  Currently on Toprol  only.  Others on  hold Repeat echocardiogram  Chronic daily smoker Smokes half pack per day Continue nicotine  patch  Impaired mobility Prior left BKA status Continue pain control with as needed Tylenol , as needed Norco, as needed Robaxin .  Patient states she is no longer on Lyrica .  Mobility: Encourage ambulation.  May need PT eval after bleeding stops  Goals of care   Code Status: Full Code     DVT prophylaxis:  SCDs Start: 03/07/24 2210   Antimicrobials: None Fluid: None Consultants: Urology Family Communication: None at bedside  Status: Inpatient Level of care:  Stepdown   Patient is from: Home Needs to continue in-hospital care: Continues to have hematuria.  May need cystoscopy inpatient. Anticipated d/c to: Disposition unclear at this time      Diet:  Diet Order             Diet regular Room service appropriate? Yes; Fluid consistency: Thin  Diet effective now                   Scheduled Meds:  sodium chloride    Intravenous Once   Chlorhexidine  Gluconate Cloth  6 each Topical Daily   feeding supplement  237 mL Oral TID BM   insulin  aspart  0-15 Units Subcutaneous TID WC   metoprolol  succinate  50 mg Oral Daily   [START ON 03/11/2024] multivitamin with minerals  1 tablet Oral Daily   nicotine   14 mg Transdermal Daily   pneumococcal 20-valent conjugate vaccine  0.5 mL Intramuscular Tomorrow-1000   senna  1 tablet Oral BID    PRN meds: acetaminophen  **OR** acetaminophen , HYDROcodone -acetaminophen , hyoscyamine, loratadine , morphine  injection, ondansetron  **OR** ondansetron  (ZOFRAN ) IV, mouth rinse, polyethylene glycol   Infusions:   sodium chloride  75 mL/hr at 03/10/24 1219   sodium chloride  irrigation      Antimicrobials: Anti-infectives (From admission, onward)    Start     Dose/Rate Route Frequency Ordered Stop   03/07/24 2015  cefTRIAXone  (ROCEPHIN ) 1 g in sodium chloride  0.9 % 100 mL IVPB        1 g 200 mL/hr over 30 Minutes Intravenous  Once 03/07/24  2001 03/07/24 2149       Objective: Vitals:   03/10/24 1200 03/10/24 1300  BP: 114/66 100/60  Pulse: 74 72  Resp: (!) 28 19  Temp:    SpO2: 99% 99%    Intake/Output Summary (Last 24 hours) at 03/10/2024 1401 Last data filed at 03/10/2024 1244 Gross per 24 hour  Intake 11659.63 ml  Output 14782 ml  Net -41115.37 ml   Filed Weights   03/07/24 2331  Weight: 62.7 kg   Weight change:  Body mass index is 18.24  kg/m.   Physical Exam: General exam: Pleasant, middle-aged Caucasian male.  Foley catheter with red urine. Skin: No rashes, lesions or ulcers. HEENT: Atraumatic, normocephalic, no obvious bleeding Lungs: Clear to auscultation bilaterally CVS: S1, S2, no murmur,   GI/Abd: Soft, nontender, nondistended, bowel sound present,   CNS: Alert, awake, oriented x 3.   Psychiatry: Mood appropriate,  Extremities: No pedal edema, no calf tenderness,   Data Review: I have personally reviewed the laboratory data and studies available.  F/u labs ordered Unresulted Labs (From admission, onward)     Start     Ordered   03/09/24 0500  CBC with Differential/Platelet  Daily,   R     Question:  Specimen collection method  Answer:  Lab=Lab collect   03/08/24 1325   03/09/24 0500  Basic metabolic panel with GFR  Daily,   R     Question:  Specimen collection method  Answer:  Lab=Lab collect   03/08/24 1325            Signed, Hoyt Macleod, MD [facility] 03/10/2024

## 2024-03-10 NOTE — Telephone Encounter (Signed)
 FYI  Copied from CRM [id number]. Topic: General - Other >> Mar 10, 2024 10:08 AM Lotus [place] B wrote:  Reason for CRM: pt called in to apologize to Dr.newlin for missing his appt . He said he didn't mean to because he is currently in the hospital

## 2024-03-10 NOTE — Progress Notes (Signed)
 Patient ID: Robert Burnett, male   DOB: August 28, 1961, 63 y.o.   MRN: [id number]    Subjective: Patient doing much better this morning. Bladder spasms improved with q4h antispasmodics. Urine clear pink on moderate drip CBI. S/p 3 units on 5/21, hgb this morning is 7.9 (6.4)  Objective: Vital signs in last 24 hours: Temp:  [97.2 F (36.2 C)-98.8 F (37.1 C)] 98.1 F (36.7 C) (05/22 0400) Pulse Rate:  [54-90] 63 (05/22 0600) Resp:  [11-26] 19 (05/22 0600) BP: (98-173)/(46-93) 130/62 (05/22 0600) SpO2:  [90 %-100 %] 100 % (05/22 0600)  Intake/Output from previous day: 05/21 0701 - 05/22 0700 In: 40981 [P.O.:240; I.V.:1636.4; Blood:950; IV Piggyback:98.5] Out: 19147 [Urine:47950] Intake/Output this shift: Total I/O In: 1369.1 [I.V.:1055.1; Blood:314] Out: 82956 [Urine:33300]  Physical Exam:  General: Alert and oriented GU: I hand irrigated his catheter and no clots noted.  CBI running at moderate drip to keep urine light pink.  Lab Results: Recent Labs    03/09/24 1145 03/09/24 2230 03/10/24 0300  HGB 6.4* 8.3* 7.9*  HCT 19.1* 24.3* 24.2*   BMET Recent Labs    03/09/24 0304 03/10/24 0300  NA 127* 126*  K 4.7 4.4  CL 93* 96*  CO2 25 23  GLUCOSE 116* 115*  BUN 30* 24*  CREATININE 1.97* 1.54*  CALCIUM  8.7* 7.9*     Studies/Results: ECHOCARDIOGRAM COMPLETE Result Date: 03/09/2024    ECHOCARDIOGRAM REPORT   Patient Name:   Robert Burnett Date of Exam: 03/09/2024 Medical Rec #:  [id number]        Height:       73.0 in Accession #:    [id number]       Weight:       138.2 lb Date of Birth:  [birth year]         BSA:          1.839 m Patient Age:    62 years         BP:           148/73 mmHg Patient Gender: M                HR:           92 bpm. Exam Location:  Inpatient Procedure: 2D Echo, Cardiac Doppler and Color Doppler (Both Spectral and Color            Flow Doppler were utilized during procedure). Indications:    Abnormal ECG 794.31 / R94.31  History:        Patient has  prior history of Echocardiogram examinations, most                 recent 08/27/2022. CHF, CAD; Risk Factors:Diabetes and                 Hypertension.  Sonographer:    Astrid Blamer Referring Phys: [id number] BINAYA DAHAL IMPRESSIONS  1. Very mild septal hypokinesis. Left ventricular ejection fraction, by estimation, is 50 to 55%. The left ventricle has low normal function. The left ventricle has no regional wall motion abnormalities. Left ventricular diastolic parameters are consistent with Grade I diastolic dysfunction (impaired relaxation).  2. Right ventricular systolic function is normal. The right ventricular size is normal. There is normal pulmonary artery systolic pressure.  3. The mitral valve is normal in structure. No evidence of mitral valve regurgitation. No evidence of mitral stenosis.  4. The aortic valve is normal in structure. Aortic valve regurgitation is not visualized. No aortic stenosis  is present.  5. The inferior vena cava is normal in size with greater than 50% respiratory variability, suggesting right atrial pressure of 3 mmHg. FINDINGS  Left Ventricle: Very mild septal hypokinesis. Left ventricular ejection fraction, by estimation, is 50 to 55%. The left ventricle has low normal function. The left ventricle has no regional wall motion abnormalities. The left ventricular internal cavity  size was normal in size. There is no left ventricular hypertrophy. Left ventricular diastolic parameters are consistent with Grade I diastolic dysfunction (impaired relaxation). Indeterminate filling pressures. Right Ventricle: The right ventricular size is normal. No increase in right ventricular wall thickness. Right ventricular systolic function is normal. There is normal pulmonary artery systolic pressure. The tricuspid regurgitant velocity is 1.95 m/s, and  with an assumed right atrial pressure of 3 mmHg, the estimated right ventricular systolic pressure is 18.2 mmHg. Left Atrium: Left atrial size was  normal in size. Right Atrium: Right atrial size was normal in size. Pericardium: There is no evidence of pericardial effusion. Mitral Valve: The mitral valve is normal in structure. No evidence of mitral valve regurgitation. No evidence of mitral valve stenosis. Tricuspid Valve: The tricuspid valve is normal in structure. Tricuspid valve regurgitation is not demonstrated. No evidence of tricuspid stenosis. Aortic Valve: The aortic valve is normal in structure. Aortic valve regurgitation is not visualized. No aortic stenosis is present. Aortic valve mean gradient measures 4.0 mmHg. Aortic valve peak gradient measures 7.4 mmHg. Aortic valve area, by VTI measures 2.30 cm. Pulmonic Valve: The pulmonic valve was normal in structure. Pulmonic valve regurgitation is not visualized. No evidence of pulmonic stenosis. Aorta: The aortic root is normal in size and structure. Venous: The inferior vena cava is normal in size with greater than 50% respiratory variability, suggesting right atrial pressure of 3 mmHg. IAS/Shunts: No atrial level shunt detected by color flow Doppler.  LEFT VENTRICLE PLAX 2D LVIDd:         4.60 cm   Diastology LVIDs:         3.40 cm   LV e' medial:    7.40 cm/s LV PW:         0.70 cm   LV E/e' medial:  10.5 LV IVS:        0.70 cm   LV e' lateral:   15.10 cm/s LVOT diam:     2.00 cm   LV E/e' lateral: 5.1 LV SV:         60 LV SV Index:   33 LVOT Area:     3.14 cm  RIGHT VENTRICLE RV S prime:     16.80 cm/s LEFT ATRIUM             Index        RIGHT ATRIUM           Index LA Vol (A2C):   58.0 ml 31.54 ml/m  RA Area:     12.00 cm LA Vol (A4C):   40.5 ml 22.02 ml/m  RA Volume:   24.00 ml  13.05 ml/m LA Biplane Vol: 48.6 ml 26.43 ml/m  AORTIC VALVE AV Area (Vmax):    2.36 cm AV Area (Vmean):   2.37 cm AV Area (VTI):     2.30 cm AV Vmax:           136.00 cm/s AV Vmean:          92.600 cm/s AV VTI:            0.262 m AV Peak Grad:  7.4 mmHg AV Mean Grad:      4.0 mmHg LVOT Vmax:         102.00  cm/s LVOT Vmean:        70.000 cm/s LVOT VTI:          0.192 m LVOT/AV VTI ratio: 0.73  AORTA Ao Root diam: 3.00 cm MITRAL VALVE               TRICUSPID VALVE MV Area (PHT): 3.61 cm    TR Peak grad:   15.2 mmHg MV E velocity: 77.60 cm/s  TR Vmax:        195.00 cm/s MV A velocity: 72.40 cm/s MV E/A ratio:  1.07        SHUNTS                            Systemic VTI:  0.19 m                            Systemic Diam: 2.00 cm Maudine Sos MD Electronically signed by Maudine Sos MD Signature Date/Time: 03/09/2024/1:13:16 PM    Final     Assessment/Plan: 1) Hematuria: Continue to hold Plavix  and Eliquis  and transfuse as indicated.  He will ultimately need cystoscopy and TURBT for what is likely a recurrent bladder tumor but this cannot be performed until he is off Plavix  for > 5 days.  Assuming his urine clears and bleeding stops, we can consider doing this next week as an outpatient, otherwise will plan to keep him inpatient until cystoscopy, likely Wednesday, 5/28.  - Repeat echo performed 5/21 showing EF 50-55%. - s/p 7 total units of blood since admission  2) AKI: Baseline Cr 1.1 last fall. Downtrending, 1.54 today with fluid resuscitation. Likely multifactorial with contrast load at admission, severe anemia, etc.  Do not suspect obstructive etiology considering CT imaging.   LOS: 3 days   Alphonza Ashing 03/10/2024, 7:00 AM

## 2024-03-11 ENCOUNTER — Telehealth: Payer: Self-pay

## 2024-03-11 ENCOUNTER — Encounter: Payer: Self-pay | Admitting: Pulmonary Disease

## 2024-03-11 DIAGNOSIS — D649 Anemia, unspecified: Secondary | ICD-10-CM | POA: Diagnosis not present

## 2024-03-11 LAB — CBC WITH DIFFERENTIAL/PLATELET
Abs Immature Granulocytes: 0.02 10*3/uL (ref 0.00–0.07)
Basophils Absolute: 0 10*3/uL (ref 0.0–0.1)
Basophils Relative: 0 %
Eosinophils Absolute: 0 10*3/uL (ref 0.0–0.5)
Eosinophils Relative: 0 %
HCT: 23.1 % — ABNORMAL LOW (ref 39.0–52.0)
Hemoglobin: 7.4 g/dL — ABNORMAL LOW (ref 13.0–17.0)
Immature Granulocytes: 0 %
Lymphocytes Relative: 20 %
Lymphs Abs: 1 10*3/uL (ref 0.7–4.0)
MCH: 28.5 pg (ref 26.0–34.0)
MCHC: 32 g/dL (ref 30.0–36.0)
MCV: 88.8 fL (ref 80.0–100.0)
Monocytes Absolute: 0.3 10*3/uL (ref 0.1–1.0)
Monocytes Relative: 6 %
Neutro Abs: 3.8 10*3/uL (ref 1.7–7.7)
Neutrophils Relative %: 74 %
Platelets: 103 10*3/uL — ABNORMAL LOW (ref 150–400)
RBC: 2.6 MIL/uL — ABNORMAL LOW (ref 4.22–5.81)
RDW: 17.4 % — ABNORMAL HIGH (ref 11.5–15.5)
WBC: 5.2 10*3/uL (ref 4.0–10.5)
nRBC: 0 % (ref 0.0–0.2)

## 2024-03-11 LAB — BASIC METABOLIC PANEL WITH GFR
Anion gap: 4 — ABNORMAL LOW (ref 5–15)
BUN: 21 mg/dL (ref 8–23)
CO2: 25 mmol/L (ref 22–32)
Calcium: 8 mg/dL — ABNORMAL LOW (ref 8.9–10.3)
Chloride: 101 mmol/L (ref 98–111)
Creatinine, Ser: 1.34 mg/dL — ABNORMAL HIGH (ref 0.61–1.24)
GFR, Estimated: 60 mL/min — ABNORMAL LOW (ref >60)
Glucose, Bld: 116 mg/dL — ABNORMAL HIGH (ref 70–99)
Potassium: 4.2 mmol/L (ref 3.5–5.1)
Sodium: 130 mmol/L — ABNORMAL LOW (ref 135–145)

## 2024-03-11 LAB — GLUCOSE, CAPILLARY
Glucose-Capillary: 118 mg/dL — ABNORMAL HIGH (ref 70–99)
Glucose-Capillary: 153 mg/dL — ABNORMAL HIGH (ref 70–99)
Glucose-Capillary: 140 mg/dL — ABNORMAL HIGH (ref 70–99)
Glucose-Capillary: 141 mg/dL — ABNORMAL HIGH (ref 70–99)

## 2024-03-11 LAB — PREPARE RBC (CROSSMATCH)

## 2024-03-11 MED ORDER — SODIUM CHLORIDE 0.9% IV SOLUTION: Freq: Once | INTRAVENOUS | Status: AC

## 2024-03-11 NOTE — Telephone Encounter (Signed)
 Auth Submission: NO AUTH NEEDED Site of care: Site of care: [facility] Payer: [place] Medicaid [employer] [place] Medication & CPT/J Code(s) submitted: Feraheme (ferumoxytol) [id number] Route of submission (phone, fax, portal): portal Phone # Fax # Auth type: Buy/Bill PB Units/visits requested: 510mg  x 2 doses Reference number:  Approval from: 03/11/24 to 07/12/24

## 2024-03-11 NOTE — Plan of Care (Signed)
  Problem: Coping: Goal: Ability to adjust to condition or change in health will improve Outcome: Progressing   Problem: Fluid Volume: Goal: Ability to maintain a balanced intake and output will improve Outcome: Progressing   Problem: Nutritional: Goal: Maintenance of adequate nutrition will improve Outcome: Progressing

## 2024-03-11 NOTE — Anesthesia Preprocedure Evaluation (Addendum)
 Anesthesia Evaluation  Patient identified by MRN, date of birth, ID band Patient awake    Reviewed: Allergy & Precautions, H&P , NPO status , Patient's Chart, lab work & pertinent test results, reviewed documented beta blocker date and time   Airway Mallampati: I  TM Distance: >3 FB Neck ROM: full    Dental no notable dental hx. (+) Poor Dentition, Chipped, Missing, Dental Advisory Given,    Pulmonary neg pulmonary ROS, asthma , pneumonia, Current Smoker   Pulmonary exam normal breath sounds clear to auscultation       Cardiovascular Exercise Tolerance: Good hypertension, Pt. on medications + CAD and + Past MI  negative cardio ROS  Rhythm:regular Rate:Normal  ECHO 5/25 1. Very mild septal hypokinesis. Left ventricular ejection fraction, by  estimation, is 50 to 55%. The left ventricle has low normal function. The  left ventricle has no regional wall motion abnormalities. Left ventricular  diastolic parameters are  consistent with Grade I diastolic dysfunction (impaired relaxation).   2. Right ventricular systolic function is normal. The right ventricular  size is normal. There is normal pulmonary artery systolic pressure.   3. The mitral valve is normal in structure. No evidence of mitral valve  regurgitation. No evidence of mitral stenosis.   4. The aortic valve is normal in structure. Aortic valve regurgitation is  not visualized. No aortic stenosis is present.   5. The inferior vena cava is normal in size with greater than 50%  respiratory variability, suggesting right atrial pressure of 3 mmHg.      Neuro/Psych  PSYCHIATRIC DISORDERS  Depression     Neuromuscular disease negative neurological ROS  negative psych ROS   GI/Hepatic negative GI ROS, Neg liver ROS,GERD  ,,  Endo/Other  negative endocrine ROSdiabetes    Renal/GU CRFRenal diseasenegative Renal ROS  negative genitourinary   Musculoskeletal negative  musculoskeletal ROS (+) Arthritis , Osteoarthritis,    Abdominal   Peds negative pediatric ROS (+)  Hematology negative hematology ROS (+) Blood dyscrasia, anemia   Anesthesia Other Findings   Reproductive/Obstetrics negative OB ROS                             Anesthesia Physical Anesthesia Plan  ASA: 4  Anesthesia Plan: General   Post-op Pain Management: Minimal or no pain anticipated   Induction: Intravenous  PONV Risk Score and Plan: 2 and Ondansetron , Dexamethasone  and Treatment may vary due to age or medical condition  Airway Management Planned: Oral ETT  Additional Equipment: None  Intra-op Plan:   Post-operative Plan: Extubation in OR  Informed Consent: I have reviewed the patients History and Physical, chart, labs and discussed the procedure including the risks, benefits and alternatives for the proposed anesthesia with the patient or authorized representative who has indicated his/her understanding and acceptance.     Dental Advisory Given  Plan Discussed with: CRNA and Anesthesiologist  Anesthesia Plan Comments:        Anesthesia Quick Evaluation

## 2024-03-11 NOTE — Procedures (Signed)
   Urology Procedure Note:  Contacted by ICU nursing earlier this morning for assistance with nonfunctional catheter.  Unable to advance or hand irrigate.  Patient is in extreme discomfort.  Unfortunately Robert Burnett has required several catheters, most recently last night.  We briefly reviewed the need to exchange his catheter and he provided consent.  I removed his existing Foley to see if he could urinate, which was unsuccessful.  Of note he was able to do this last night when Dr. Aden Agreste was exchanging his catheter. I went to the operating room to retrieve a 59f Rusch hematuria catheter.  On my return,l the patient was prepped and draped in the usual sterile fashion.  I then injected 10cc of lidocaine  infused sterile lubricant directly into the urethra.  After a moment for anesthetic effect had been provided I then advanced a straight zip wire into the bladder without difficulty.  I then fashioned a 45f Rusch three-way hematuria catheter into a council tip with a 14-gauge Angiocath.  This was advanced over wire using Seldinger technique into the bladder without resistance.  There was an immediate return of bright red urine. Having confirmed placement, the balloon was inflated with 30cc of sterile water .  I then hand irrigated the bladder with several 50cc syringes of sterile water , only retrieving a few small clots.  Catheter was placed to gravity drainage without dependent loops, CBI was reconnected and fully opened to assess for patency.  After monitoring this for about 5 minutes I titrated his CBI down to a low gtt., rendering light pink irrigant.  This concluded the procedure.  Alla Ar, NP [facility] Pager: [phone]

## 2024-03-11 NOTE — Procedures (Signed)
 Foley Catheter Placement Note  Indications: 63 y.o. male with hematuria and a nonfunctioning indwelling foley  Pre-operative Diagnosis: hematuria  Post-operative Diagnosis: Same  Surgeon: Alphonza Ashing, MD  Assistants: None  Procedure Details  The patient's indwelling foley was noted to not be draining despite hand irrigation and manipulation. Decision made to replace.   Patient was placed in the supine position, prepped with Betadine  and draped in the usual sterile fashion.  The indwelling foley was threaded with an angled zip wire. The balloon was deflated and catheter was removed. Area was re-prepped with betadine . We injected lidocaine  jelly per urethra.  We then inserted a 20 [place] coude hematuria catheter over the wire per urethra which easily passed into the bladder without any resistance at the prostatic urethra.  We achieved return of clear yellow urine and then proceeded to insert 10 mL of sterile water  into the Foley balloon.  The wire was removed. The catheter was attached to a drainage bag and secured with a StatLock.  There was return of 400cc of clear red urine. I hooked him back up to CBI at a moderate drip with resulting clear pink urine.                Complications: None; patient tolerated the procedure well.  Plan:   1.  See daily progress note. Awaiting plavix  washout for cystoscopy likely Wednesday, 5/28       Attending Attestation: Dr. Claretta Croft was available.

## 2024-03-11 NOTE — Progress Notes (Signed)
 PROGRESS NOTE  Robert Burnett  DOB: Mar 12, 1961  PCP: Joaquin Mulberry, MD NUU:[id number]  DOA: 03/07/2024  LOS: 4 days  Hospital Day: 5  Brief narrative: Robert Burnett is a 63 y.o. male with PMH significant for DM2, HTN, HLD, CAD/stent on Plavix , paroxysmal A-fib, CHF, diabetic neuropathy, GERD, left BKA status bladder cancer 5/19, patient presented to the ED with complaint of hematuria, dyspnea. Patient used to follow-up with Dr. Florencio Hunting at [facility] urology for bladder cancer.  He was on intravesical BCG at some point.  Currently not undergoing treatment.  He developed gross hematuria in January but did not bring this to the attention of any of his providers and has had persistent hematuria since then.  Over the last several weeks, he has become increasingly weak and developed shortness of breath on exertion.    In the ED, patient was afebrile, hemodynamically stable.   He was noted to have urinary retention. Urology needed to be called for Foley insertion.  During Foley insertion, patient became severely agitated and tachycardic.  He was started on CBI. Labs showed WC count 3.6, hemoglobin 4.6 with MCV 75, BUN/creatinine 20/1.51, potassium low at 3.4, sodium low at 129 IV Rocephin  was started by urology Admitted to [facility]  Since admission, patient continues to have hematuria.  He has required multiple attempts of catheter manipulation because of clots.  Continues to remain on CBI  Subjective: Patient was seen and examined this morning. Propped up in bed.  Not in distress.  Continues to have hematuria.  Noted that he went from this morning.  It seems was done by urology  Assessment and plan: Acute on chronic gross hematuria Acute urinary retention Patient has known bladder cancer and had decided to forego treatment. Presented with hematuria for last 5 months Noted to be in urinary retention.  Seen by urology.  CBI started. Since admission, patient continues to have  hematuria.  He has required multiple attempts of catheter manipulation because of clots.  Continues to remain on CBI Urology following On hyoscyamine for control of bladder spasm.    Symptomatic anemia Acute on chronic blood loss anemia Severe iron deficiency anemia 5 months of bleeding from bladder cancer led to drop in hemoglobin to 4.6.  Receiving blood transfusions. 7 units of PRBC transfusion given so far. Hemoglobin still remains low 7.4 this morning, slight drop follow status.  Continue to monitor. IV iron given. Continue PPI Recent Labs    03/08/24 0253 03/08/24 1122 03/09/24 0304 03/09/24 1145 03/09/24 2230 03/10/24 0300 03/11/24 0305  HGB 6.3*   < > 6.4* 6.4* 8.3* 7.9* 7.4*  MCV 80.3  --  79.2* 82.7  --  87.1 88.8  VITAMINB12 392  --   --   --   --   --   --   FOLATE 13.7  --   --   --   --   --   --   FERRITIN 3*  --   --   --   --   --   --   TIBC 444  --   --   --   --   --   --   IRON 31*  --   --   --   --   --   --   RETICCTPCT 1.3  --   --   --   --   --   --    < > = values in this interval not displayed.  Bladder cancer S/p TURBT 2022 Patient used to follow-up with Dr. Florencio Hunting at [facility] urology for bladder cancer.  He was on intravesical BCG at some point.  Lately decided to forego treatment. Seen by urology in the hospital.  Patient has decided to resume treatment. Tentative plan of cystoscopy and TURBT next week inpatient versus outpatient.  Hyponatremia Sodium level downtrending. Urine sodium level elevated.  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 Not on IV fluid.  Self improving today.  Recent Labs  Lab 03/07/24 1506 03/08/24 0253 03/09/24 0304 03/10/24 0300 03/11/24 0305  NA 129* 130* 127* 126* 130*   Type 2 diabetes mellitus Peripheral neuropathy A1c 5.4 on 03/08/2024 PTA meds-Novolin  70/30 and Farxiga  Currently on SSI/Accu-Cheks.  Add Semglee  daily Recent Labs  Lab 03/10/24 0805 03/10/24 1153 03/10/24 1602 03/10/24 2240 03/11/24 0836   GLUCAP 146* 207* 178* 183* 118*   CAD H/o MI 05/2022 s/p stent No anginal symptoms.  Troponin normal Eliquis  and Plavix  on hold statin  Paroxysmal A-fib Continue Toprol  Eliquis  plan as above  Mild systolic CHF HTN PTA meds- Toprol , Lasix , valsartan , Farxiga  Currently on Toprol  only.  Others on hold Repeat echocardiogram showed EF improved to 50 to 55%.  Currently compensated  Chronic daily smoker Smokes half pack per day Continue nicotine  patch  Impaired mobility Prior left BKA status Continue pain control with as needed Tylenol , as needed Norco, as needed Robaxin .  Patient states she is no longer on Lyrica .  Mobility: Encourage ambulation.  May need PT eval after bleeding stops  Goals of care   Code Status: Full Code     DVT prophylaxis:  SCDs Start: 03/07/24 2210   Antimicrobials: None Fluid: None Consultants: Urology Family Communication: None at bedside  Status: Inpatient Level of care:  Stepdown   Patient is from: Home Needs to continue in-hospital care: Continues to have hematuria.  May need cystoscopy inpatient. Anticipated d/c to: Disposition unclear at this time      Diet:  Diet Order             Diet NPO time specified  Diet effective midnight           Diet regular Room service appropriate? Yes; Fluid consistency: Thin  Diet effective now                   Scheduled Meds:  sodium chloride    Intravenous Once   sodium chloride    Intravenous Once   Chlorhexidine  Gluconate Cloth  6 each Topical Daily   feeding supplement  237 mL Oral TID BM   insulin  aspart  0-5 Units Subcutaneous QHS   insulin  aspart  0-9 Units Subcutaneous TID WC   insulin  glargine-yfgn  5 Units Subcutaneous Daily   metoprolol  succinate  50 mg Oral Daily   multivitamin with minerals  1 tablet Oral Daily   nicotine   14 mg Transdermal Daily   pneumococcal 20-valent conjugate vaccine  0.5 mL Intramuscular Tomorrow-1000   senna  1 tablet Oral BID    PRN  meds: acetaminophen  **OR** acetaminophen , HYDROcodone -acetaminophen , hyoscyamine, loratadine , morphine  injection, ondansetron  **OR** ondansetron  (ZOFRAN ) IV, mouth rinse, polyethylene glycol   Infusions:   sodium chloride  irrigation      Antimicrobials: Anti-infectives (From admission, onward)    Start     Dose/Rate Route Frequency Ordered Stop   03/07/24 2015  cefTRIAXone  (ROCEPHIN ) 1 g in sodium chloride  0.9 % 100 mL IVPB        1 g 200 mL/hr over 30 Minutes Intravenous  Once  03/07/24 2001 03/07/24 2149       Objective: Vitals:   03/11/24 1000 03/11/24 1017  BP: (!) 125/59 (!) 125/59  Pulse: 77 78  Resp: 17   Temp:    SpO2: 100%     Intake/Output Summary (Last 24 hours) at 03/11/2024 1050 Last data filed at 03/11/2024 1022 Gross per 24 hour  Intake 9658.95 ml  Output 16109 ml  Net -40216.05 ml   Filed Weights   03/07/24 2331  Weight: 62.7 kg   Weight change:  Body mass index is 18.24 kg/m.   Physical Exam: General exam: Pleasant, middle-aged Caucasian male.  Foley catheter with pinkish red urine. Skin: No rashes, lesions or ulcers. HEENT: Atraumatic, normocephalic, no obvious bleeding Lungs: Clear to auscultation bilaterally CVS: S1, S2, no murmur,   GI/Abd: Soft, nontender, nondistended, bowel sound present,   CNS: Alert, awake, oriented x 3.   Psychiatry: Mood appropriate,  Extremities: No pedal edema, no calf tenderness,   Data Review: I have personally reviewed the laboratory data and studies available.  F/u labs ordered Unresulted Labs (From admission, onward)     Start     Ordered   03/12/24 0500  Basic metabolic panel with GFR  Tomorrow morning,   R       Question:  Specimen collection method  Answer:  Lab=Lab collect   03/11/24 0758   03/12/24 0500  CBC with Differential/Platelet  Tomorrow morning,   R       Question:  Specimen collection method  Answer:  Lab=Lab collect   03/11/24 0758   03/11/24 1030  Type and screen [facility]  Once,   R       Comments: [facility]    03/11/24 1030   03/11/24 0958  Prepare RBC (crossmatch)  (Blood Administration Adult)  Once,   R       Question Answer Comment  # of Units 2 units   Transfusion Indications Hemoglobin 8 gm/dL or less and orthopedic or cardiac surgery or pre-existing cardiac condition   Number of Units to Keep Ahead NO units ahead   Date/Time blood product needed 5/24 7 AM [employer]. Rec 2 units available. Anemic, ongoing bleeding, heart disease, OR 5/24, high liklihood of transfusion peri-op.   If emergent release call blood bank Not emergent release      03/11/24 0958            Signed, Hoyt Macleod, MD [facility] 03/11/2024

## 2024-03-11 NOTE — Plan of Care (Signed)
  Problem: Fluid Volume: Goal: Ability to maintain a balanced intake and output will improve Outcome: Progressing   Problem: Nutritional: Goal: Maintenance of adequate nutrition will improve Outcome: Progressing   Problem: Clinical Measurements: Goal: Ability to maintain clinical measurements within normal limits will improve Outcome: Progressing Goal: Respiratory complications will improve Outcome: Progressing   Problem: Nutrition: Goal: Adequate nutrition will be maintained Outcome: Progressing   Problem: Elimination: Goal: Will not experience complications related to bowel motility Outcome: Progressing

## 2024-03-11 NOTE — Progress Notes (Signed)
 Kala Orchard, urology NP paged by RN after receiving shift report at about 0720. Patient's foley was leaking, and RN was unable to successfully irrigate catheter. No urine was able to pass through catheter and patient was having severe bladder pain. NP successfully placed a new 24Fr triple lumen CBI catheter. See NP procedure note. CBI now irrigating without complication. RN will continue to carefully monitor for flow issues.

## 2024-03-12 ENCOUNTER — Inpatient Hospital Stay (HOSPITAL_COMMUNITY): Admitting: Anesthesiology

## 2024-03-12 ENCOUNTER — Encounter (HOSPITAL_COMMUNITY)
Admission: EM | Disposition: A | Discharge status: Home / Self Care | Payer: Self-pay | Source: Home / Self Care | Attending: Internal Medicine

## 2024-03-12 ENCOUNTER — Inpatient Hospital Stay (HOSPITAL_COMMUNITY)

## 2024-03-12 DIAGNOSIS — I10 Essential (primary) hypertension: Secondary | ICD-10-CM

## 2024-03-12 DIAGNOSIS — D649 Anemia, unspecified: Secondary | ICD-10-CM | POA: Diagnosis not present

## 2024-03-12 DIAGNOSIS — F17210 Nicotine dependence, cigarettes, uncomplicated: Secondary | ICD-10-CM

## 2024-03-12 DIAGNOSIS — C674 Malignant neoplasm of posterior wall of bladder: Secondary | ICD-10-CM | POA: Diagnosis not present

## 2024-03-12 DIAGNOSIS — I2510 Atherosclerotic heart disease of native coronary artery without angina pectoris: Secondary | ICD-10-CM

## 2024-03-12 HISTORY — PX: TRANSURETHRAL RESECTION OF BLADDER TUMOR: SHX2575

## 2024-03-12 HISTORY — PX: CYSTOSCOPY W/ RETROGRADES: SHX1426

## 2024-03-12 LAB — CBC WITH DIFFERENTIAL/PLATELET
Abs Immature Granulocytes: 0.02 10*3/uL (ref 0.00–0.07)
Basophils Absolute: 0 10*3/uL (ref 0.0–0.1)
Basophils Relative: 0 %
Eosinophils Absolute: 0 10*3/uL (ref 0.0–0.5)
Eosinophils Relative: 1 %
HCT: 21.3 % — ABNORMAL LOW (ref 39.0–52.0)
Hemoglobin: 6.7 g/dL — CL (ref 13.0–17.0)
Immature Granulocytes: 1 %
Lymphocytes Relative: 40 %
Lymphs Abs: 1.2 10*3/uL (ref 0.7–4.0)
MCH: 28.3 pg (ref 26.0–34.0)
MCHC: 31.5 g/dL (ref 30.0–36.0)
MCV: 89.9 fL (ref 80.0–100.0)
Monocytes Absolute: 0.2 10*3/uL (ref 0.1–1.0)
Monocytes Relative: 6 %
Neutro Abs: 1.5 10*3/uL — ABNORMAL LOW (ref 1.7–7.7)
Neutrophils Relative %: 52 %
Platelets: 97 10*3/uL — ABNORMAL LOW (ref 150–400)
RBC: 2.37 MIL/uL — ABNORMAL LOW (ref 4.22–5.81)
RDW: 17.8 % — ABNORMAL HIGH (ref 11.5–15.5)
WBC: 3 10*3/uL — ABNORMAL LOW (ref 4.0–10.5)
nRBC: 0 % (ref 0.0–0.2)

## 2024-03-12 LAB — GLUCOSE, CAPILLARY
Glucose-Capillary: 126 mg/dL — ABNORMAL HIGH (ref 70–99)
Glucose-Capillary: 218 mg/dL — ABNORMAL HIGH (ref 70–99)
Glucose-Capillary: 158 mg/dL — ABNORMAL HIGH (ref 70–99)
Glucose-Capillary: 182 mg/dL — ABNORMAL HIGH (ref 70–99)

## 2024-03-12 LAB — PREPARE RBC (CROSSMATCH)

## 2024-03-12 LAB — BASIC METABOLIC PANEL WITH GFR
Anion gap: 4 — ABNORMAL LOW (ref 5–15)
BUN: 21 mg/dL (ref 8–23)
CO2: 23 mmol/L (ref 22–32)
Calcium: 8.2 mg/dL — ABNORMAL LOW (ref 8.9–10.3)
Chloride: 103 mmol/L (ref 98–111)
Creatinine, Ser: 1.16 mg/dL (ref 0.61–1.24)
GFR, Estimated: >60 mL/min (ref >60)
Glucose, Bld: 96 mg/dL (ref 70–99)
Potassium: 4.4 mmol/L (ref 3.5–5.1)
Sodium: 130 mmol/L — ABNORMAL LOW (ref 135–145)

## 2024-03-12 LAB — HEMOGLOBIN AND HEMATOCRIT, BLOOD
HCT: 26.6 % — ABNORMAL LOW (ref 39.0–52.0)
Hemoglobin: 8.6 g/dL — ABNORMAL LOW (ref 13.0–17.0)

## 2024-03-12 SURGERY — TURBT (TRANSURETHRAL RESECTION OF BLADDER TUMOR)
Anesthesia: General | Site: Ureter

## 2024-03-12 MED ORDER — PROPOFOL 10 MG/ML IV BOLUS
INTRAVENOUS | Status: AC
  Filled 2024-03-12: qty 20

## 2024-03-12 MED ORDER — OXYCODONE HCL 5 MG PO TABS
ORAL_TABLET | ORAL | Status: AC
  Filled 2024-03-12: qty 1

## 2024-03-12 MED ORDER — OXYCODONE HCL 5 MG/5ML PO SOLN: 5.0000 mg | Freq: Once | ORAL | Status: DC | PRN

## 2024-03-12 MED ORDER — SODIUM CHLORIDE 0.9 % IV SOLN: INTRAVENOUS | Status: DC | PRN

## 2024-03-12 MED ORDER — CEFAZOLIN SODIUM-DEXTROSE 2-4 GM/100ML-% IV SOLN
INTRAVENOUS | Status: AC
  Filled 2024-03-12: qty 100

## 2024-03-12 MED ORDER — OXYCODONE HCL 5 MG/5ML PO SOLN: 5.0000 mg | Freq: Once | ORAL | Status: AC | PRN

## 2024-03-12 MED ORDER — MIDAZOLAM HCL 2 MG/2ML IJ SOLN
INTRAMUSCULAR | Status: AC
  Filled 2024-03-12: qty 2

## 2024-03-12 MED ORDER — LIDOCAINE HCL (PF) 2 % IJ SOLN
INTRAMUSCULAR | Status: AC
  Filled 2024-03-12: qty 5

## 2024-03-12 MED ORDER — SODIUM CHLORIDE 0.9 % IR SOLN
Status: DC | PRN
  Administered 2024-03-12 (×2): 6000 mL via INTRAVESICAL

## 2024-03-12 MED ORDER — OXYCODONE HCL 5 MG PO TABS
5.0000 mg | ORAL_TABLET | Freq: Once | ORAL | Status: AC | PRN
  Administered 2024-03-12: 5 mg via ORAL

## 2024-03-12 MED ORDER — SODIUM CHLORIDE 0.9% IV SOLUTION: Freq: Once | INTRAVENOUS | Status: DC

## 2024-03-12 MED ORDER — CEFAZOLIN SODIUM-DEXTROSE 2-4 GM/100ML-% IV SOLN
2.0000 g | INTRAVENOUS | Status: AC
  Administered 2024-03-12: 2 g via INTRAVENOUS

## 2024-03-12 MED ORDER — ONDANSETRON HCL 4 MG/2ML IJ SOLN: 4.0000 mg | Freq: Once | INTRAMUSCULAR | Status: DC | PRN

## 2024-03-12 MED ORDER — FENTANYL CITRATE PF 50 MCG/ML IJ SOSY
PREFILLED_SYRINGE | INTRAMUSCULAR | Status: AC
  Filled 2024-03-12: qty 3

## 2024-03-12 MED ORDER — DEXAMETHASONE SODIUM PHOSPHATE 10 MG/ML IJ SOLN
INTRAMUSCULAR | Status: AC
  Filled 2024-03-12: qty 1

## 2024-03-12 MED ORDER — FENTANYL CITRATE PF 50 MCG/ML IJ SOSY
25.0000 ug | PREFILLED_SYRINGE | INTRAMUSCULAR | Status: DC | PRN
  Administered 2024-03-12 (×2): 50 ug via INTRAVENOUS

## 2024-03-12 MED ORDER — IOHEXOL 300 MG/ML  SOLN
INTRAMUSCULAR | Status: DC | PRN
  Administered 2024-03-12: 10 mL

## 2024-03-12 MED ORDER — CEFAZOLIN (ANCEF) 1 G IV SOLR: 2.0000 g | INTRAVENOUS | Status: DC

## 2024-03-12 MED ORDER — ROCURONIUM BROMIDE 10 MG/ML (PF) SYRINGE
PREFILLED_SYRINGE | INTRAVENOUS | Status: DC | PRN
  Administered 2024-03-12: 50 mg via INTRAVENOUS

## 2024-03-12 MED ORDER — PROPOFOL 10 MG/ML IV BOLUS
INTRAVENOUS | Status: DC | PRN
  Administered 2024-03-12: 40 mg via INTRAVENOUS
  Administered 2024-03-12: 200 mg via INTRAVENOUS

## 2024-03-12 MED ORDER — FENTANYL CITRATE (PF) 100 MCG/2ML IJ SOLN
INTRAMUSCULAR | Status: AC
  Filled 2024-03-12: qty 2

## 2024-03-12 MED ORDER — DEXAMETHASONE SODIUM PHOSPHATE 10 MG/ML IJ SOLN
INTRAMUSCULAR | Status: DC | PRN
  Administered 2024-03-12: 6 mg via INTRAVENOUS

## 2024-03-12 MED ORDER — FENTANYL CITRATE (PF) 100 MCG/2ML IJ SOLN
INTRAMUSCULAR | Status: DC | PRN
  Administered 2024-03-12: 50 ug via INTRAVENOUS
  Administered 2024-03-12 (×2): 25 ug via INTRAVENOUS
  Administered 2024-03-12: 50 ug via INTRAVENOUS

## 2024-03-12 MED ORDER — LIDOCAINE HCL (PF) 2 % IJ SOLN
INTRAMUSCULAR | Status: DC | PRN
  Administered 2024-03-12: 100 mg via INTRADERMAL

## 2024-03-12 MED ORDER — ROCURONIUM BROMIDE 10 MG/ML (PF) SYRINGE
PREFILLED_SYRINGE | INTRAVENOUS | Status: AC
  Filled 2024-03-12: qty 10

## 2024-03-12 MED ORDER — MEPERIDINE HCL 50 MG/ML IJ SOLN: 6.2500 mg | INTRAMUSCULAR | Status: DC | PRN

## 2024-03-12 MED ORDER — CELECOXIB 200 MG PO CAPS: 200.0000 mg | ORAL_CAPSULE | Freq: Once | ORAL | Status: DC

## 2024-03-12 MED ORDER — MIDAZOLAM HCL 2 MG/2ML IJ SOLN
INTRAMUSCULAR | Status: DC | PRN
  Administered 2024-03-12: 2 mg via INTRAVENOUS

## 2024-03-12 MED ORDER — ACETAMINOPHEN 500 MG PO TABS
1000.0000 mg | ORAL_TABLET | Freq: Once | ORAL | Status: AC
  Administered 2024-03-12: 1000 mg via ORAL
  Filled 2024-03-12: qty 2

## 2024-03-12 MED ORDER — SODIUM CHLORIDE 0.9% IV SOLUTION: Freq: Once | INTRAVENOUS | Status: AC

## 2024-03-12 MED ORDER — OXYCODONE HCL 5 MG PO TABS: 5.0000 mg | ORAL_TABLET | Freq: Once | ORAL | Status: DC | PRN

## 2024-03-12 MED ORDER — FENTANYL CITRATE PF 50 MCG/ML IJ SOSY
25.0000 ug | PREFILLED_SYRINGE | INTRAMUSCULAR | Status: DC | PRN
  Administered 2024-03-12: 50 ug via INTRAVENOUS

## 2024-03-12 MED ORDER — SUGAMMADEX SODIUM 200 MG/2ML IV SOLN
INTRAVENOUS | Status: DC | PRN
  Administered 2024-03-12: 200 mg via INTRAVENOUS

## 2024-03-12 MED ORDER — ONDANSETRON HCL 4 MG/2ML IJ SOLN
INTRAMUSCULAR | Status: AC
  Filled 2024-03-12: qty 2

## 2024-03-12 MED ORDER — ONDANSETRON HCL 4 MG/2ML IJ SOLN
INTRAMUSCULAR | Status: DC | PRN
Start: 2024-03-12 — End: 2024-03-12
  Administered 2024-03-12: 4 mg via INTRAVENOUS

## 2024-03-12 MED ORDER — LACTATED RINGERS IV SOLN: INTRAVENOUS | Status: DC | PRN

## 2024-03-12 SURGICAL SUPPLY — 21 items
BAG URINE DRAIN 2000ML AR STRL (UROLOGICAL SUPPLIES) IMPLANT
BAG URO CATCHER STRL LF (MISCELLANEOUS) ×2 IMPLANT
CATH URETL OPEN END 6FR 70 (CATHETERS) IMPLANT
CATH URTH STD 24FR FL 3W 2 (CATHETERS) IMPLANT
CLOTH BEACON ORANGE TIMEOUT ST (SAFETY) ×2 IMPLANT
DRAPE FOOT SWITCH (DRAPES) ×2 IMPLANT
ELECT REM PT RETURN 15FT ADLT (MISCELLANEOUS) ×2 IMPLANT
EVACUATOR MICROVAS BLADDER (UROLOGICAL SUPPLIES) IMPLANT
GLOVE SURG LX STRL 7.5 STRW (GLOVE) ×2 IMPLANT
GOWN STRL REUS W/ TWL XL LVL3 (GOWN DISPOSABLE) ×2 IMPLANT
GUIDEWIRE STR DUAL SENSOR (WIRE) ×2 IMPLANT
KIT BASIN OR (CUSTOM PROCEDURE TRAY) IMPLANT
KIT TURNOVER KIT A (KITS) IMPLANT
LOOP CUT BIPOLAR 24F LRG (ELECTROSURGICAL) IMPLANT
MANIFOLD NEPTUNE II (INSTRUMENTS) ×2 IMPLANT
NS IRRIG 1000ML POUR BTL (IV SOLUTION) IMPLANT
PACK CYSTO (CUSTOM PROCEDURE TRAY) ×2 IMPLANT
PACK LAPAROSCOPY BASIN (CUSTOM PROCEDURE TRAY) IMPLANT
SYRINGE TOOMEY IRRIG 70ML (MISCELLANEOUS) IMPLANT
TUBING CONNECTING 10 (TUBING) ×2 IMPLANT
TUBING UROLOGY SET (TUBING) ×2 IMPLANT

## 2024-03-12 NOTE — Transfer of Care (Addendum)
 Immediate Anesthesia Transfer of Care Note  Patient: Robert Burnett  Procedure(s) Performed: TURBT (TRANSURETHRAL RESECTION OF BLADDER TUMOR) (Bladder) CYSTOSCOPY, WITH RETROGRADE PYELOGRAM (Bilateral: Ureter)  Patient Location: PACU  Anesthesia Type:General  Level of Consciousness: awake and patient cooperative  Airway & Oxygen Therapy: Patient Spontanous Breathing  Post-op Assessment: Report given to RN and Post -op Vital signs reviewed and stable  Post vital signs: Reviewed and stable  Last Vitals:  Vitals Value Taken Time  BP 144/84 03/12/24 0848  Temp 36.4 C 03/12/24 0848  Pulse 83 03/12/24 0852  Resp 19 03/12/24 0852  SpO2 100 % 03/12/24 0852  Vitals shown include unfiled device data.  Last Pain:  Vitals:   03/12/24 0848  TempSrc:   PainSc: 4          Complications: Patient awake, alert, interactive with staff. Breathing with comfort. Denies pain at present.

## 2024-03-12 NOTE — Plan of Care (Signed)
 Plavix  wash out should be complete tomorrow am. Pt posted for 730a start case for cystoscopy with TURBT, clot evacuation and fulguration w/ Dr. Secundino Dach. NPO at midnight

## 2024-03-12 NOTE — Plan of Care (Signed)
  Problem: Education: Goal: Ability to describe self-care measures that may prevent or decrease complications (Diabetes Survival Skills Education) will improve Outcome: Progressing   Problem: Coping: Goal: Ability to adjust to condition or change in health will improve Outcome: Progressing

## 2024-03-12 NOTE — Progress Notes (Signed)
 PROGRESS NOTE  CRYSTIAN FRITH  DOB: 1961/02/07  PCP: Joaquin Mulberry, MD ZOX:[id number]  DOA: 03/07/2024  LOS: 5 days  Hospital Day: 6  Brief narrative: KOJO LIBY is a 63 y.o. male with PMH significant for DM2, HTN, HLD, CAD/stent on Plavix , paroxysmal A-fib, CHF, diabetic neuropathy, GERD, left BKA status bladder cancer 5/19, patient presented to the ED with complaint of hematuria, dyspnea. Patient used to follow-up with Dr. Florencio Hunting at [facility] urology for bladder cancer.  He was on intravesical BCG at some point.  Currently not undergoing treatment.  He developed gross hematuria in January but did not bring this to the attention of any of his providers and has had persistent hematuria since then.  Over the last several weeks, he has become increasingly weak and developed shortness of breath on exertion.    In the ED, patient was afebrile, hemodynamically stable.   He was noted to have urinary retention. Urology needed to be called for Foley insertion.  During Foley insertion, patient became severely agitated and tachycardic.  He was started on CBI. Labs showed WC count 3.6, hemoglobin 4.6 with MCV 75, BUN/creatinine 20/1.51, potassium low at 3.4, sodium low at 129 IV Rocephin  was started by urology Admitted to [facility]  Since admission, patient continues to have hematuria.  He has required multiple attempts of catheter manipulation because of clots.  Continues to remain on CBI =================================================================== Subjective:  The patient was seen and this morning. Hemoglobin dropped to 6.7 again overnight 1 unit PRBC blood transfusion was ordered  Patient is status post cystoscopy with TURBT procedure this morning with Dr. Secundino Dach     Assessment and plan: Acute on chronic gross hematuria w Acute urinary retention -Known bladder cancer and had decided to forego treatment. -Presented with hematuria for last 5 months - Noted to be in urinary  retention.  Seen by urology.  CBI started. Since admission, patient continues to have hematuria.  He has required multiple attempts of catheter manipulation because of clots.  Continues to remain on CBI Urology following On hyoscyamine for control of bladder spasm.   03/12/2024 -  TURBT, clot evacuation, transurethral resection of a bladder tumor  Symptomatic anemia -with acute on chronic blood loss anemia, iron deficiency - 5 months of bleeding from bladder cancer led to drop in hemoglobin to 4.6.   - Receiving blood transfusions. - total of 8 units of PRBC transfusion given so far. -Hemoglobin down to 6.7 overnight another 1 unit PRBC transfused   Continue to monitor. IV iron given. Continue PPI  Bladder cancer S/p TURBT 2022 - Patient used to follow-up with Dr. Florencio Hunting at [facility] urology for bladder cancer.  He was on intravesical BCG at some point.  Lately decided to forego treatment. Seen by urology in the hospital.  Patient has decided to resume treatment.  03/12/24 - cystoscopy and TURBT, clot evacuation, transurethral resection of bladder tumor,  Hyponatremia Sodium level downtrending. - suspect SIADH. Not on IV fluid.  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  Add Semglee  daily Recent Labs  Lab 03/11/24 1155 03/11/24 1655 03/11/24 2132 03/12/24 0850 03/12/24 1127  GLUCAP 153* 140* 141* 126* 218*   CAD  w H/o MI 05/2022 s/p stent No anginal symptoms.  Troponin normal Eliquis   and Plavix  on hold On statin  Paroxysmal A-fib Continue Toprol  Eliquis  -on hold  Mild systolic CHF HTN PTA meds- Toprol , Lasix , valsartan , Farxiga  Currently on Toprol  only.  Others on hold Repeat echocardiogram showed EF improved to 50  to 55%.  Currently compensated  Chronic daily smoker Smokes half pack per day Continue nicotine  patch  Impaired mobility Prior left BKA status Continue pain control with as needed Tylenol , as needed Norco, as needed Robaxin .  Patient states she is no longer on Lyrica .  Mobility: Encourage ambulation.  May need PT eval after bleeding stops     Goals of care   Code Status: Full Code    DVT prophylaxis:  SCDs Start: 03/07/24 2210   Antimicrobials: None Fluid: None Consultants: Urology Family Communication: None at bedside  Status: Inpatient Level of care:  Stepdown   Patient is from: Home Needs to continue in-hospital care: Continues to have hematuria.  May need cystoscopy inpatient. Anticipated d/c to: Disposition unclear at this time      Diet:  Diet Order             Diet regular Fluid consistency: Thin  Diet effective now                   Scheduled Meds:  sodium chloride    Intravenous Once   sodium chloride    Intravenous Once   Chlorhexidine  Gluconate Cloth  6 each Topical Daily   feeding supplement  237 mL Oral TID BM   insulin  aspart  0-5 Units Subcutaneous QHS   insulin  aspart  0-9 Units Subcutaneous TID WC   insulin  glargine-yfgn  5 Units Subcutaneous Daily   metoprolol  succinate  50 mg Oral Daily   multivitamin with minerals  1 tablet Oral Daily   nicotine   14 mg Transdermal Daily   pneumococcal 20-valent conjugate vaccine  0.5 mL Intramuscular Tomorrow-1000   senna  1 tablet Oral BID    PRN meds: acetaminophen  **OR** acetaminophen , HYDROcodone -acetaminophen , hyoscyamine, loratadine , morphine  injection, ondansetron  **OR** ondansetron  (ZOFRAN ) IV, mouth rinse, polyethylene glycol   Infusions:   sodium chloride  irrigation      Antimicrobials: Anti-infectives (From admission, onward)    Start     Dose/Rate Route Frequency Ordered Stop   03/12/24 0730  ceFAZolin  (ANCEF ) powder 2 g  Status:  Discontinued        2 g Other To Surgery  03/12/24 0724 03/12/24 0729   03/12/24 0730  ceFAZolin  (ANCEF ) IVPB 2g/100 mL premix        2 g 200 mL/hr over 30 Minutes Intravenous On call to O.R. 03/12/24 0729 03/12/24 0806   03/07/24 2015  cefTRIAXone  (ROCEPHIN ) 1 g in sodium chloride  0.9 % 100 mL IVPB        1 g 200 mL/hr over 30 Minutes Intravenous  Once 03/07/24 2001 03/07/24 2149       Objective: Vitals:   03/12/24 1000 03/12/24 1054  BP: (!) 156/82 (!) 156/82  Pulse: 79 82  Resp: 17   Temp:    SpO2: 96%     Intake/Output Summary (Last 24 hours) at 03/12/2024 1150 Last data filed at 03/12/2024 1000 Gross per 24 hour  Intake 21172.84 ml  Output 16109 ml  Net -15677.16 ml   Filed Weights   03/07/24 2331  Weight: 62.7 kg   Weight change:  Body mass index is 18.24 kg/m.   Physical Exam:  General:  AAO x 3,  cooperative, no distress;   HEENT:  Normocephalic, PERRL, otherwise with  in Normal limits   Neuro:  CNII-XII intact. , normal motor and sensation, reflexes intact   Lungs:   Clear to auscultation BL, Respirations unlabored,  No wheezes / crackles  Cardio:    S1/S2, RRR, No murmure, No Rubs or Gallops   Abdomen:  Soft, non-tender, bowel sounds active all four quadrants, no guarding or peritoneal signs.  Muscular  skeletal:  Left BKA  Limited exam -global generalized weaknesses - in bed, able to move all 4 extremities,   2+ pulses,  symmetric, No pitting edema  Skin:  Dry, warm to touch, negative for any Rashes,  Wounds: Please see nursing documentation          Data Review: I have personally reviewed the laboratory data and studies available.  F/u labs ordered Unresulted Labs (From admission, onward)     Start     Ordered   03/13/24 0500  CBC  Daily,   R     Question:  Specimen collection method  Answer:  Lab=Lab collect   03/12/24 0700   03/13/24 0500  Basic metabolic panel with GFR  Daily,   R     Question:  Specimen collection method  Answer:  Lab=Lab collect   03/12/24 0700             SIGNED: [name], MD, FHM. FAAFP Total of 55 minutes of critical care was spent seeing the patient, reviewing medical records, drawn plan of care [facility],  Pager (please use [email or web address] to page/text)  Please use Epic Secure Chat for non-urgent communication (7AM-7PM) If 7PM-7AM, please contact night-coverage [email or web address],  03/12/2024, 11:50 AM

## 2024-03-12 NOTE — Progress Notes (Signed)
   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 H/o left nephroureterectomy previously.   2 - Severe Hematuria with Anemia - ON blood thinners for CV indicaiton at baseline. Large hemature 02/2024 prompring admission. Has now held blood thinners. Tranfusion dependant.  Today "Robert Burnett" is seen to proceed with cysto / clot evacuation / transurethral resection of bladder tumor / Rt retrograde. Hgb overnight 6.7 and transfusion orders placed. Has been anemic.   Objective: Vital signs in last 24 hours: Temp:  [98.1 F (36.7 C)-99 F (37.2 C)] 98.1 F (36.7 C) (05/24 0507) Pulse Rate:  [60-117] 64 (05/24 0507) Resp:  [14-37] 16 (05/24 0507) BP: (110-145)/(45-76) 118/58 (05/24 0507) SpO2:  [96 %-100 %] 100 % (05/24 0507) Last BM Date : 03/07/24  Intake/Output from previous day: 05/23 0701 - 05/24 0700 In: 16109 [P.O.:240; I.V.:250; Blood:60] Out: 60454 [Urine:43350] Intake/Output this shift: Total I/O In: 15310 [I.V.:250; Blood:60; Other:15000] Out: 09811 [Urine:27350]  NAD, pleasant Non-labored breathing on minimal [place] O2 SNTND Foley in place on fast irrigation with light red urine No c/c/e  Lab Results:  Recent Labs    03/11/24 0305 03/12/24 0310  WBC 5.2 3.0*  HGB 7.4* 6.7*  HCT 23.1* 21.3*  PLT 103* 97*   BMET Recent Labs    03/11/24 0305 03/12/24 0310  NA 130* 130*  K 4.2 4.4  CL 101 103  CO2 25 23  GLUCOSE 116* 96  BUN 21 21  CREATININE 1.34* 1.16  CALCIUM  8.0* 8.2*   PT/INR No results for input(s): "LABPROT", "INR" in the last 72 hours. ABG No results for input(s): "PHART", "HCO3" in the last 72 hours.  Invalid input(s): "PCO2", "PO2"  Studies/Results: No results found.  Anti-infectives: Anti-infectives (From admission, onward)    Start     Dose/Rate Route Frequency Ordered Stop   03/07/24 2015  cefTRIAXone  (ROCEPHIN ) 1 g in sodium chloride   0.9 % 100 mL IVPB        1 g 200 mL/hr over 30 Minutes Intravenous  Once 03/07/24 2001 03/07/24 2149       Assessment/Plan:  Proceed as planned with OR cysto, Rt retrograde, TURBT, cysto clot-evac for recurrent bladder cancer causing hemodynamically significatn bleeding. Risks, benefits, alternatives, peri-op course discussed. His baseline fair functional capacity, CV disease, and acute on chornic anemia increases risk of ALL peri-op complications, but surgery is clearly necessary to help reverse underlying process.    Melody Spurling. 03/12/2024

## 2024-03-12 NOTE — Anesthesia Procedure Notes (Addendum)
 Procedure Name: Intubation Date/Time: 03/12/2024 7:43 AM  Performed by: Juluis Ok, CRNAPre-anesthesia Checklist: Patient identified, Emergency Drugs available, Suction available and Patient being monitored Patient Re-evaluated:Patient Re-evaluated prior to induction Oxygen Delivery Method: Circle system utilized Preoxygenation: Pre-oxygenation with 100% oxygen Induction Type: IV induction Ventilation: Mask ventilation without difficulty Laryngoscope Size: Mac and 4 Grade View: Grade I Tube type: Oral Tube size: 7.0 mm Number of attempts: 1 Airway Equipment and Method: Stylet Placement Confirmation: ETT inserted through vocal cords under direct vision, positive ETCO2 and breath sounds checked- equal and bilateral Secured at: 22 cm Tube secured with: Tape Dental Injury: Teeth and Oropharynx as per pre-operative assessment

## 2024-03-12 NOTE — Hospital Course (Signed)
 Robert Burnett is a 63 y.o. male with PMH significant for DM2, HTN, HLD, CAD/stent on Plavix , paroxysmal A-fib, CHF, diabetic neuropathy, GERD, left BKA status bladder cancer 5/19, patient presented to the ED with complaint of hematuria, dyspnea. Patient used to follow-up with Dr. Florencio Hunting at [facility] urology for bladder cancer.  He was on intravesical BCG at some point.  Currently not undergoing treatment.   He developed gross hematuria in January but did not bring this to the attention of any of his providers and has had persistent hematuria since then.  Over the last several weeks, he has become increasingly weak and developed shortness of breath on exertion.     In the ED, patient was afebrile, hemodynamically stable.   He was noted to have urinary retention. Urology needed to be called for Foley insertion.  During Foley insertion, patient became severely agitated and tachycardic.  He was started on CBI. Labs showed WC count 3.6, hemoglobin 4.6 with MCV 75, BUN/creatinine 20/1.51, potassium low at 3.4, sodium low at 129 IV Rocephin  was started by urology Admitted to [facility]   Since admission, patient continues to have hematuria.  He has required multiple attempts of catheter manipulation because of clots.  Continues to remain on CBI.   Subjective: Patient was seen and examined this morning. Propped up in bed.  Not in distress.  Continues to have hematuria.  Noted that he went from this morning.  It seems was done by urology  Assessment and plan: Acute on chronic gross hematuria Acute urinary retention Patient has known bladder cancer and had decided to forego treatment. Presented with hematuria for last 5 months Noted to be in urinary retention.  Seen by urology.  CBI started. Since admission, patient continues to have hematuria.  He has required multiple attempts of catheter manipulation because of clots.  Continues to remain on CBI Urology following On hyoscyamine for control of  bladder spasm.     Symptomatic anemia Acute on chronic blood loss anemia Severe iron deficiency anemia 5 months of bleeding from bladder cancer led to drop in hemoglobin to 4.6.  Receiving blood transfusions. 7 units of PRBC transfusion given so far. Hemoglobin still remains low 7.4 this morning, slight drop follow status.  Continue to monitor. IV iron given. Continue PPI  S/p TURBT 2022 Patient used to follow-up with Dr. Florencio Hunting at [facility] urology for bladder cancer.  He was on intravesical BCG at some point.  Lately decided to forego treatment. Seen by urology in the hospital.  Patient has decided to resume treatment. Tentative plan of cystoscopy and TURBT next week inpatient versus outpatient.   Hyponatremia Sodium level downtrending. Urine sodium level elevated.  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 Not on IV fluid.  Self improving today.  Last Labs         Recent Labs  Lab 03/07/24 1506 03/08/24 0253 03/09/24 0304 03/10/24 0300 03/11/24 0305  NA 129* 130* 127* 126* 130*      Type 2 diabetes mellitus Peripheral neuropathy A1c 5.4 on 03/08/2024 PTA meds-Novolin  70/30 and Farxiga  Currently on SSI/Accu-Cheks.  Add Semglee  daily Last Labs         Recent Labs  Lab 03/10/24 0805 03/10/24 1153 03/10/24 1602 03/10/24 2240 03/11/24 0836  GLUCAP 146* 207* 178* 183* 118*      CAD H/o MI 05/2022 s/p stent No anginal symptoms.  Troponin normal Eliquis  and Plavix  on hold statin   Paroxysmal A-fib Continue Toprol  Eliquis  plan as above   Mild systolic  CHF HTN PTA meds- Toprol , Lasix , valsartan , Farxiga  Currently on Toprol  only.  Others on hold Repeat echocardiogram showed EF improved to 50 to 55%.  Currently compensated   Chronic daily smoker Smokes half pack per day Continue nicotine  patch   Impaired mobility Prior left BKA status Continue pain control with as needed Tylenol , as needed Norco, as needed Robaxin .  Patient states she is no longer on Lyrica .

## 2024-03-12 NOTE — Op Note (Signed)
 NAME: [name], [name] MEDICAL RECORD NO: [id number] ACCOUNT NO: [id number] DATE OF BIRTH: Sep 28, 1961 FACILITY: [facility] LOCATION: [facility] PHYSICIAN: Osborn Blaze, MD  Operative Report   DATE OF PROCEDURE: 03/12/2024  PREOPERATIVE DIAGNOSIS:  Recurrent bladder cancer with hemodynamically significant bleeding.  PROCEDURES PERFORMED: 1.  Transurethral resection of bladder tumor, volume large. 2.  Right retrograde pyelogram with interpretation. 3.  Cystoscopy with clot evacuation with fulguration of bleeders.  ESTIMATED BLOOD LOSS:  Approximately 100 mL new blood.  TRANSFUSION:  1 unit given intraoperatively.  SPECIMENS:   Bladder tumor Base of bladder tumor  FINDINGS: 1.  Approximately 200 mL old clot found within the urinary bladder.  2.  Approximately 5 cm right lateral bladder tumor. 3.  Two additional foci, each 1 cm posterior wall.  4.  Unremarkable right retrograde pyelogram following transurethral resection.  DRAINS: 1.  A 3-way Foley catheter, normal saline irrigation.  INDICATIONS:  The patient is a pleasant but variably compliant and quite comorbid 63 year old man with a history of left nephroureterectomy years ago for urothelial carcinoma.  He was lost to followup and re-presented with hemodynamically significant  bleeding from what appears to be a new bladder neoplasm.  He has been on blood thinners, transfusion dependent in the ICU, awaiting clearance of his blood thinners.  He has now been off of them for approximately 5 days.  It was felt that operative  intervention was warranted as he continues to bleed and be transfusion dependent.  I counseled him towards cysto clot evacuation, transurethral resection of bladder tumor, and right retrograde pyelogram.  He wished to proceed.  Informed consent was  obtained and placed in the medical record.  His hemoglobin this morning was 6.7.  He was given 1 unit of blood perioperatively.  He has recently been anemic, and tolerant  of hemoglobin of around 7.  DESCRIPTION OF PROCEDURE:  The patient being Robert Burnett verified and procedure being transurethral resection of bladder tumor and right retrograde pyelogram and clot evacuation with fulguration was confirmed.  Procedure timeout was performed.   Intravenous antibiotics administered.  General endotracheal anesthesia induced.  The patient was placed into a low lithotomy position.  His left below-knee amputation stump was carefully padded with foam. Sterile field was created, prepped and draped the  patient's penis, perineum and proximal thighs using iodine . Cystoscopy was performed using a 26-French resectoscope sheath with visual obturator.  Inspection of the anterior and posterior urethra was unremarkable.  Inspection of the bladder revealed a  large amount of formed clot within the area of the bladder.  A Toomey syringe was then carefully used to perform clot evacuation.  Approximately 200 mL of old clot was evacuated from the urinary bladder and was set aside for discard.  Following this,  there was much better visualization, and this did reveal, as anticipated, a fairly large papillary neoplasm just lateral to what was felt to be likely the right ureteral orifice, that is fortunately not directly involving the ureteral orifice.  There was  another satellite lesion posteriorly, 1 cm x 2.  Using a resectoscope loop, a very careful dissection was performed down to the superficial fibromuscular stroma of the urinary bladder of all foci.  This generated many bladder tumor fragments, which were irrigated and set aside for  permanent pathology.  Then visualization was even better still, and hemostasis was much improved.  Next, cold cup biopsy forceps were used to obtain a representative deep specimen of the dominant focus of tumor, which was lateral  to the right ureteral  orifice.  And the small tumor fragments were set aside, tissue fragments set aside labeled as base of bladder  tumor.  Next, a loop was used to fulgurate the base and periphery of all resection sites, taking exquisite care to avoid any injury to the  ureteral orifice.  There was no evidence of perforation.  Hemostasis was then excellent.  Next, the right ureteral orifice was cannulated with a 6-French end-hole catheter and a right retrograde pyelogram was obtained.  Right retrograde pyelogram demonstrated a singe right ureter and a single system right kidney.  No filling defects were noted.  No significant hydronephrosis was noted.  It was not felt that interval stenting would be warranted.  A sensor wire was  advanced through the cystoscope to the level of the urinary bladder and curled.  Using a Seldinger technique, a 24-French, 3-way Foley catheter was carefully placed over this to minimize trauma to the bladder and bladder neck.  20 mL of sterile water  was  placed in the balloon.  This was cleaned with normal saline irrigation.  The efflux was light pink.  The procedure was terminated.  The patient tolerated the procedure well.  No immediate perioperative complications.  The patient was taken to  postanesthesia care unit in stable condition.  We will plan for continued ICU admission.    MUK D: 03/12/2024 8:32:27 am T: 03/12/2024 9:39:00 am  JOB: [id number]/ [id number]

## 2024-03-12 NOTE — Progress Notes (Signed)
 PT Cancellation Note  Patient Details Name: FIRAS GUARDADO MRN: [id number] DOB: November 27, 1960   Cancelled Treatment:     Procedure.......OR cysto, Rt retrograde, TURBT, cysto clot-evac.   cysto / clot evacuation / transurethral resection of bladder tumor / Rt retrograde.  Hgb overnight 6.7   Rehab Team to continue to follow and attempt to see another day    Bess Broody  PTA Acute  Rehabilitation Services Office M-F          [phone]

## 2024-03-12 NOTE — Brief Op Note (Signed)
 03/12/2024  8:26 AM  PATIENT:  Robert Burnett  63 y.o. male  PRE-OPERATIVE DIAGNOSIS:  BLADDER CANCER  POST-OPERATIVE DIAGNOSIS:  BLADDER CANCER  PROCEDURE:  Procedure(s): TURBT (TRANSURETHRAL RESECTION OF BLADDER TUMOR) (N/A) CYSTOSCOPY, WITH RETROGRADE PYELOGRAM (Bilateral)  SURGEON:  Surgeons and Role:    * Manny, Harvey Linen., MD - Primary  PHYSICIAN ASSISTANT:   ASSISTANTS: none   ANESTHESIA:   general  EBL:    BLOOD ADMINISTERED:300 CC PRBC (1 unit)  DRAINS: 3 way foley to NS irrigation   LOCAL MEDICATIONS USED:  NONE  SPECIMEN:  Source of Specimen:  1 - Bladder tumor; 2 - Base of bladder tumor  DISPOSITION OF SPECIMEN:  PATHOLOGY  COUNTS:  YES  TOURNIQUET:  * No tourniquets in log *  DICTATION: .Other Dictation: Dictation Number [id number]  PLAN OF CARE: Admit to inpatient   PATIENT DISPOSITION:  PACU - hemodynamically stable.   Delay start of Pharmacological VTE agent (>24hrs) due to surgical blood loss or risk of bleeding: yes

## 2024-03-13 DIAGNOSIS — R310 Gross hematuria: Secondary | ICD-10-CM | POA: Diagnosis not present

## 2024-03-13 DIAGNOSIS — C679 Malignant neoplasm of bladder, unspecified: Secondary | ICD-10-CM

## 2024-03-13 DIAGNOSIS — D649 Anemia, unspecified: Secondary | ICD-10-CM | POA: Diagnosis not present

## 2024-03-13 LAB — CBC
HCT: 24.9 % — ABNORMAL LOW (ref 39.0–52.0)
Hemoglobin: 7.8 g/dL — ABNORMAL LOW (ref 13.0–17.0)
MCH: 28.7 pg (ref 26.0–34.0)
MCHC: 31.3 g/dL (ref 30.0–36.0)
MCV: 91.5 fL (ref 80.0–100.0)
Platelets: 95 10*3/uL — ABNORMAL LOW (ref 150–400)
RBC: 2.72 MIL/uL — ABNORMAL LOW (ref 4.22–5.81)
RDW: 17.3 % — ABNORMAL HIGH (ref 11.5–15.5)
WBC: 3.6 10*3/uL — ABNORMAL LOW (ref 4.0–10.5)
nRBC: 0 % (ref 0.0–0.2)

## 2024-03-13 LAB — BASIC METABOLIC PANEL WITH GFR
Anion gap: 8 (ref 5–15)
BUN: 18 mg/dL (ref 8–23)
CO2: 22 mmol/L (ref 22–32)
Calcium: 8.3 mg/dL — ABNORMAL LOW (ref 8.9–10.3)
Chloride: 102 mmol/L (ref 98–111)
Creatinine, Ser: 1.15 mg/dL (ref 0.61–1.24)
GFR, Estimated: >60 mL/min (ref >60)
Glucose, Bld: 180 mg/dL — ABNORMAL HIGH (ref 70–99)
Potassium: 4.2 mmol/L (ref 3.5–5.1)
Sodium: 132 mmol/L — ABNORMAL LOW (ref 135–145)

## 2024-03-13 LAB — GLUCOSE, CAPILLARY
Glucose-Capillary: 126 mg/dL — ABNORMAL HIGH (ref 70–99)
Glucose-Capillary: 123 mg/dL — ABNORMAL HIGH (ref 70–99)
Glucose-Capillary: 109 mg/dL — ABNORMAL HIGH (ref 70–99)
Glucose-Capillary: 102 mg/dL — ABNORMAL HIGH (ref 70–99)

## 2024-03-13 NOTE — Progress Notes (Signed)
 Urology Inpatient Progress Note  Subjective: Postop day #1 status post TURBT and evacuation of clot. His urine remains clear with minimal CBI.  Anti-infectives: Anti-infectives (From admission, onward)    Start     Dose/Rate Route Frequency Ordered Stop   03/12/24 0730  ceFAZolin  (ANCEF ) powder 2 g  Status:  Discontinued        2 g Other To Surgery 03/12/24 0724 03/12/24 0729   03/12/24 0730  ceFAZolin  (ANCEF ) IVPB 2g/100 mL premix        2 g 200 mL/hr over 30 Minutes Intravenous On call to O.R. 03/12/24 0729 03/12/24 0806   03/07/24 2015  cefTRIAXone  (ROCEPHIN ) 1 g in sodium chloride  0.9 % 100 mL IVPB        1 g 200 mL/hr over 30 Minutes Intravenous  Once 03/07/24 2001 03/07/24 2149       Current Facility-Administered Medications  Medication Dose Route Frequency Provider Last Rate Last Admin   0.9 %  sodium chloride  infusion (Manually program via Guardrails IV Fluids)   Intravenous Once Olena Bernard, NP   Held at 03/09/24 0634   0.9 %  sodium chloride  infusion (Manually program via Guardrails IV Fluids)   Intravenous Once Manny, Theodore B Jr., MD       acetaminophen  (TYLENOL ) tablet 650 mg  650 mg Oral Q6H PRN Rizwan, Saima, MD       Or   acetaminophen  (TYLENOL ) suppository 650 mg  650 mg Rectal Q6H PRN Rizwan, Saima, MD       Chlorhexidine  Gluconate Cloth 2 % PADS 6 each  6 each Topical Daily Rizwan, Saima, MD   6 each at 03/12/24 1059   feeding supplement (ENSURE ENLIVE / ENSURE PLUS) liquid 237 mL  237 mL Oral TID BM Dahal, Aminta Baldy, MD   237 mL at 03/13/24 0011   HYDROcodone -acetaminophen  (NORCO/VICODIN) 5-325 MG per tablet 1-2 tablet  1-2 tablet Oral Q4H PRN Rizwan, Saima, MD   2 tablet at 03/13/24 1401   hyoscyamine (ANASPAZ) disintergrating tablet 0.25 mg  0.25 mg Sublingual Q4H PRN Ellis Guys, NP   0.25 mg at 03/13/24 0443   insulin  aspart (novoLOG ) injection 0-5 Units  0-5 Units Subcutaneous QHS Dahal, Aminta Baldy, MD       insulin  aspart (novoLOG ) injection 0-9  Units  0-9 Units Subcutaneous TID WC Dahal, Binaya, MD   1 Units at 03/13/24 1222   insulin  glargine-yfgn (SEMGLEE ) injection 5 Units  5 Units Subcutaneous Daily Dahal, Binaya, MD   5 Units at 03/13/24 1000   loratadine  (CLARITIN ) tablet 10 mg  10 mg Oral Daily PRN Hoyt Macleod, MD   10 mg at 03/10/24 1040   metoprolol  succinate (TOPROL -XL) 24 hr tablet 50 mg  50 mg Oral Daily Rizwan, Saima, MD   50 mg at 03/13/24 1610   morphine  (PF) 2 MG/ML injection 1 mg  1 mg Intravenous Q4H PRN Dahal, Binaya, MD   1 mg at 03/13/24 1223   multivitamin with minerals tablet 1 tablet  1 tablet Oral Daily Dahal, Aminta Baldy, MD   1 tablet at 03/13/24 0914   nicotine  (NICODERM CQ  - dosed in mg/24 hours) patch 14 mg  14 mg Transdermal Daily Rizwan, Saima, MD   14 mg at 03/13/24 1000   ondansetron  (ZOFRAN ) tablet 4 mg  4 mg Oral Q6H PRN Rizwan, Saima, MD       Or   ondansetron  (ZOFRAN ) injection 4 mg  4 mg Intravenous Q6H PRN Rizwan, Saima, MD   4 mg  at 03/10/24 0610   Oral care mouth rinse  15 mL Mouth Rinse PRN Sedalia Dacosta, MD       pneumococcal 20-valent conjugate vaccine (PREVNAR 20) injection 0.5 mL  0.5 mL Intramuscular Tomorrow-1000 Rizwan, Saima, MD       polyethylene glycol (MIRALAX  / GLYCOLAX ) packet 17 g  17 g Oral Daily PRN Dahal, Aminta Baldy, MD       senna (SENOKOT) tablet 8.6 mg  1 tablet Oral BID Rizwan, Saima, MD   8.6 mg at 03/13/24 0913   sodium chloride  irrigation 0.9 % 3,000 mL  3,000 mL Irrigation Continuous Sattenfield, Cameron W, NP   3,000 mL at 03/13/24 0515     Objective: Vital signs in last 24 hours: Temp:  [98.4 F (36.9 C)-98.9 F (37.2 C)] 98.4 F (36.9 C) (05/25 1200) Pulse Rate:  [58-77] 58 (05/25 1100) Resp:  [12-22] 15 (05/25 1100) BP: (107-139)/(56-82) 120/56 (05/25 1100) SpO2:  [100 %] 100 % (05/25 1100)  Intake/Output from previous day: 05/24 0701 - 05/25 0700 In: 7977.2 [I.V.:1282.2; Blood:295; IV Piggyback:100] Out: 16109 [Urine:11750] Intake/Output this shift: Total  I/O In: 480 [P.O.:480] Out: 4250 [Urine:4250]  GENERAL APPEARANCE:  Well appearing, well developed, well nourished, NAD HEENT:  Atraumatic, normocephalic, oropharynx clear NECK:  Supple without lymphadenopathy or thyromegaly ABDOMEN:  Soft, non-tender, no masses EXTREMITIES:  Moves all extremities well, without clubbing, cyanosis, or edema NEUROLOGIC:  Alert and oriented x 3,CN II-XII grossly intact MENTAL STATUS:  appropriate BACK:  Non-tender to palpation, No CVAT SKIN:  Warm, dry, and intact GU: foley draining clear urine   Lab Results:  Recent Labs    03/12/24 0310 03/12/24 1301 03/13/24 0255  WBC 3.0*  --  3.6*  HGB 6.7* 8.6* 7.8*  HCT 21.3* 26.6* 24.9*  PLT 97*  --  95*   BMET Recent Labs    03/12/24 0310 03/13/24 0255  NA 130* 132*  K 4.4 4.2  CL 103 102  CO2 23 22  GLUCOSE 96 180*  BUN 21 18  CREATININE 1.16 1.15  CALCIUM  8.2* 8.3*   PT/INR No results for input(s): "LABPROT", "INR" in the last 72 hours. ABG No results for input(s): "PHART", "HCO3" in the last 72 hours.  Invalid input(s): "PCO2", "PO2"  Studies/Results: DG C-Arm 1-60 Min-No Report Result Date: 03/12/2024 Fluoroscopy was utilized by the requesting physician.  No radiographic interpretation.     Assessment & Plan: Recurrent bladder cancer Gross hematuria with anemia  He is doing well postoperatively. Urine remains clear with minimal CBI. Will clamp CBI and monitor for any recurrence of hematuria.   Oda Bence, MD 03/13/2024

## 2024-03-13 NOTE — Progress Notes (Signed)
 PROGRESS NOTE  Robert Burnett  DOB: 18-Dec-1960  PCP: Joaquin Mulberry, MD ZOX:[id number]  DOA: 03/07/2024  LOS: 6 days  Hospital Day: 7  Brief narrative: Robert Burnett is a 63 y.o. male with PMH significant for DM2, HTN, HLD, CAD/stent on Plavix , paroxysmal A-fib, CHF, diabetic neuropathy, GERD, left BKA status bladder cancer 5/19, patient presented to the ED with complaint of hematuria, dyspnea. Patient used to follow-up with Dr. Florencio Hunting at [facility] urology for bladder cancer.  He was on intravesical BCG at some point.  Currently not undergoing treatment.  He developed gross hematuria in January but did not bring this to the attention of any of his providers and has had persistent hematuria since then.  Over the last several weeks, he has become increasingly weak and developed shortness of breath on exertion.    In the ED, patient was afebrile, hemodynamically stable.   He was noted to have urinary retention. Urology needed to be called for Foley insertion.  During Foley insertion, patient became severely agitated and tachycardic.  He was started on CBI. Labs showed WC count 3.6, hemoglobin 4.6 with MCV 75, BUN/creatinine 20/1.51, potassium low at 3.4, sodium low at 129 IV Rocephin  was started by urology Admitted to [facility]  Since admission, patient continues to have hematuria.  He has required multiple attempts of catheter manipulation because of clots.  Continues to remain on CBI =================================================================== Subjective:  Patient was seen and examined this morning, awake alert oriented, no acute distress Tolerated the procedure well yesterday. Hemodynamically stable  Foley catheter still in place irrigating, urine has become clear now  S/p 1 PRBC transfusion yesterday 03/12/2024 Mild drop in hemoglobin noted and discussed with patient regarding further transfusion as may be  needed  ===================================================================   Assessment and plan: Acute on chronic gross hematuria w Acute urinary retention -S/p 03/12/2024 TURBT, with clot evacuation, and transurethral resection of bladder tumor - Hemodynamically stable - Foley catheter with continuous irrigation in place-becoming clear   -Known bladder cancer and had decided to forego treatment. -Presented with hematuria for last 5 months - Noted to be in urinary retention.  Seen by urology.    Since admission, patient continues to have hematuria.  He has required multiple attempts of catheter manipulation because of clots.   Continues to remain on CBI Urology following - On Hyoscyamine for control of bladder spasm.    Symptomatic anemia -with acute on chronic blood loss anemia, iron deficiency - 5 months of bleeding from bladder cancer led to drop in hemoglobin to 4.6.   - Receiving blood transfusions. - total of 8 units of PRBC transfusion given so far. -Hemoglobin down to 6.7 overnight another 1 unit PRBC transfused 5/24  - Continue to monitor. -IV iron given. Continue PPI     Latest Ref Rng & Units 03/13/2024    2:55 AM 03/12/2024    1:01 PM 03/12/2024    3:10 AM  CBC  WBC 4.0 - 10.5 K/uL 3.6   3.0   Hemoglobin 13.0 - 17.0 g/dL 7.8  8.6  6.7   Hematocrit 39.0 - 52.0 % 24.9  26.6  21.3   Platelets 150 - 400 K/uL 95   97      Bladder cancer S/p TURBT 2022 - Patient used to follow-up with Dr. Florencio Hunting at [facility] urology for bladder cancer.  He was on intravesical BCG at some point.  Lately decided to forego treatment.   03/12/24 - cystoscopy and TURBT, clot evacuation, transurethral resection  of bladder tumor,  Hyponatremia Sodium level downtrending. - suspect SIADH. Not on IV fluid.  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  Add Semglee  daily Recent Labs  Lab 03/12/24 1127 03/12/24 1550 03/12/24 2138 03/13/24 0729 03/13/24 1220  GLUCAP 218* 158* 182* 126* 123*   CAD  w H/o MI 05/2022 s/p stent No anginal symptoms.  Troponin normal Eliquis  and Plavix  on hold On statin  Paroxysmal A-fib Continue Toprol  Eliquis  -on hold  Mild systolic CHF -remains stable HTN -stable PTA meds- Toprol , Lasix , valsartan , Farxiga  Currently on Toprol  only.  Others on hold Repeat echocardiogram showed EF improved to 50 to 55%.  Currently compensated  Chronic daily smoker Smokes half pack per day Continue nicotine  patch  Impaired mobility Prior left BKA status Continue pain control with as needed Tylenol , as needed Norco, as needed Robaxin .  Patient is on Lyrica  -will try to increase dose of Lyrica  Still complaining of pain, muscle spasm  Mobility: Encourage ambulation.  May need PT eval after bleeding stops     Goals of care   Code Status: Full Code    DVT prophylaxis:  SCDs Start: 03/07/24 2210   Antimicrobials: None Fluid: None Consultants: Urology Family Communication: None at bedside  Status: Inpatient Level of care:  Stepdown   Patient is from: Home Needs to continue in-hospital care: Continues to have hematuria.  May need cystoscopy inpatient. Anticipated d/c to: Disposition unclear at this time      Diet:  Diet Order             Diet regular Fluid consistency: Thin  Diet effective now                   Scheduled Meds:  sodium chloride    Intravenous Once   sodium chloride    Intravenous Once   Chlorhexidine  Gluconate Cloth  6 each Topical Daily   feeding supplement  237 mL Oral TID BM   insulin  aspart  0-5 Units Subcutaneous QHS   insulin  aspart  0-9 Units Subcutaneous TID WC   insulin  glargine-yfgn  5 Units Subcutaneous Daily    metoprolol  succinate  50 mg Oral Daily   multivitamin with minerals  1 tablet Oral Daily   nicotine   14 mg Transdermal Daily   pneumococcal 20-valent conjugate vaccine  0.5 mL Intramuscular Tomorrow-1000   senna  1 tablet Oral BID    PRN meds: acetaminophen  **OR** acetaminophen , HYDROcodone -acetaminophen , hyoscyamine, loratadine , morphine  injection, ondansetron  **OR** ondansetron  (ZOFRAN ) IV, mouth rinse, polyethylene glycol   Infusions:   sodium chloride  irrigation      Antimicrobials: Anti-infectives (From admission, onward)    Start     Dose/Rate Route Frequency Ordered Stop   03/12/24 0730  ceFAZolin  (ANCEF ) powder 2 g  Status:  Discontinued        2 g Other To Surgery 03/12/24 0724 03/12/24 0729   03/12/24 0730  ceFAZolin  (ANCEF ) IVPB 2g/100 mL premix        2 g 200 mL/hr over 30 Minutes Intravenous On call to O.R. 03/12/24 1610 03/12/24 0806   03/07/24 2015  cefTRIAXone  (ROCEPHIN ) 1 g in sodium chloride  0.9 % 100 mL IVPB        1 g 200 mL/hr over 30 Minutes Intravenous  Once 03/07/24 2001 03/07/24 2149       Objective: Vitals:   03/13/24 1000 03/13/24 1100  BP: 127/71 (!) 120/56  Pulse: 67 (!) 58  Resp: (!) 22 15  Temp:    SpO2: 100% 100%    Intake/Output Summary (Last 24 hours) at 03/13/2024 1244 Last data filed at 03/13/2024 1200 Gross per 24 hour  Intake 3480 ml  Output 52841 ml  Net -9820 ml   Filed Weights   03/07/24 2331  Weight: 62.7 kg   Weight change:  Body mass index is 18.24 kg/m.       General:  AAO x 3,  cooperative, no distress;   HEENT:  Normocephalic, PERRL, otherwise with in Normal limits   Neuro:  CNII-XII intact. , normal motor and sensation, reflexes intact   Lungs:   Clear to auscultation BL, Respirations unlabored,  No wheezes / crackles  Cardio:    S1/S2, RRR, No murmure, No Rubs or Gallops   Abdomen:  Soft, non-tender, bowel sounds active all four quadrants, no guarding or peritoneal signs.  Muscular  skeletal:  Left  BKA Limited exam -global generalized weaknesses - in bed, able to move all 4 extremities,   2+ pulses,  symmetric, No pitting edema  Skin:  Dry, warm to touch, negative for any Rashes,  Foley catheter in place, with continuous bladder irrigation,             Data Review: I have personally reviewed the laboratory data and studies available.  F/u labs ordered Unresulted Labs (From admission, onward)     Start     Ordered   03/13/24 0500  CBC  Daily,   R     Question:  Specimen collection method  Answer:  Lab=Lab collect   03/12/24 0700   03/13/24 0500  Basic metabolic panel with GFR  Daily,   R     Question:  Specimen collection method  Answer:  Lab=Lab collect   03/12/24 0700            SIGNED: [name], MD, FHM. FAAFP Total of 55 minutes of critical care was spent seeing the patient, reviewing medical records, drawn plan of care [facility],  Pager (please use [email or web address] to page/text)  Please use Epic Secure Chat for non-urgent communication (7AM-7PM) If 7PM-7AM, please contact night-coverage [email or web address],  03/13/2024, 12:44 PM

## 2024-03-13 NOTE — Plan of Care (Signed)
  Problem: Coping: Goal: Ability to adjust to condition or change in health will improve Outcome: Progressing   Problem: Fluid Volume: Goal: Ability to maintain a balanced intake and output will improve Outcome: Progressing   Problem: Health Behavior/Discharge Planning: Goal: Ability to identify and utilize available resources and services will improve Outcome: Progressing Goal: Ability to manage health-related needs will improve Outcome: Progressing   Problem: Metabolic: Goal: Ability to maintain appropriate glucose levels will improve Outcome: Progressing   Problem: Nutritional: Goal: Maintenance of adequate nutrition will improve Outcome: Progressing   Problem: Skin Integrity: Goal: Risk for impaired skin integrity will decrease Outcome: Progressing

## 2024-03-13 NOTE — Anesthesia Postprocedure Evaluation (Signed)
 Anesthesia Post Note  Patient: Robert Burnett  Procedure(s) Performed: TURBT (TRANSURETHRAL RESECTION OF BLADDER TUMOR) (Bladder) CYSTOSCOPY, WITH RETROGRADE PYELOGRAM (Bilateral: Ureter)     Patient location during evaluation: PACU Anesthesia Type: General Level of consciousness: awake and alert Pain management: pain level controlled Vital Signs Assessment: post-procedure vital signs reviewed and stable Respiratory status: spontaneous breathing, nonlabored ventilation, respiratory function stable and patient connected to nasal cannula oxygen Cardiovascular status: blood pressure returned to baseline and stable Postop Assessment: no apparent nausea or vomiting Anesthetic complications: no   No notable events documented.  Last Vitals:  Vitals:   03/13/24 1700 03/13/24 1800  BP: (!) 150/78 139/63  Pulse: 61 (!) 54  Resp: 20 17  Temp:    SpO2: 100% 100%    Last Pain:  Vitals:   03/13/24 1800  TempSrc:   PainSc: 9                  Brittne Kawasaki

## 2024-03-14 ENCOUNTER — Encounter (HOSPITAL_COMMUNITY): Payer: Self-pay | Admitting: Urology

## 2024-03-14 DIAGNOSIS — D649 Anemia, unspecified: Secondary | ICD-10-CM | POA: Diagnosis not present

## 2024-03-14 DIAGNOSIS — C679 Malignant neoplasm of bladder, unspecified: Secondary | ICD-10-CM | POA: Diagnosis not present

## 2024-03-14 DIAGNOSIS — R310 Gross hematuria: Secondary | ICD-10-CM | POA: Diagnosis not present

## 2024-03-14 LAB — CBC
HCT: 25.7 % — ABNORMAL LOW (ref 39.0–52.0)
Hemoglobin: 8 g/dL — ABNORMAL LOW (ref 13.0–17.0)
MCH: 28.2 pg (ref 26.0–34.0)
MCHC: 31.1 g/dL (ref 30.0–36.0)
MCV: 90.5 fL (ref 80.0–100.0)
Platelets: 92 10*3/uL — ABNORMAL LOW (ref 150–400)
RBC: 2.84 MIL/uL — ABNORMAL LOW (ref 4.22–5.81)
RDW: 17 % — ABNORMAL HIGH (ref 11.5–15.5)
WBC: 2.9 10*3/uL — ABNORMAL LOW (ref 4.0–10.5)
nRBC: 0 % (ref 0.0–0.2)

## 2024-03-14 LAB — GLUCOSE, CAPILLARY
Glucose-Capillary: 125 mg/dL — ABNORMAL HIGH (ref 70–99)
Glucose-Capillary: 137 mg/dL — ABNORMAL HIGH (ref 70–99)
Glucose-Capillary: 171 mg/dL — ABNORMAL HIGH (ref 70–99)
Glucose-Capillary: 127 mg/dL — ABNORMAL HIGH (ref 70–99)

## 2024-03-14 LAB — BASIC METABOLIC PANEL WITH GFR
Anion gap: 8 (ref 5–15)
BUN: 20 mg/dL (ref 8–23)
CO2: 21 mmol/L — ABNORMAL LOW (ref 22–32)
Calcium: 8.3 mg/dL — ABNORMAL LOW (ref 8.9–10.3)
Chloride: 101 mmol/L (ref 98–111)
Creatinine, Ser: 1.12 mg/dL (ref 0.61–1.24)
GFR, Estimated: >60 mL/min (ref >60)
Glucose, Bld: 90 mg/dL (ref 70–99)
Potassium: 4.5 mmol/L (ref 3.5–5.1)
Sodium: 130 mmol/L — ABNORMAL LOW (ref 135–145)

## 2024-03-14 NOTE — Progress Notes (Signed)
 Physical Therapy Treatment Patient Details Name: KARLON SCHLAFER MRN: [id number] DOB: 1961-06-26 Today's Date: 03/14/2024   History of Present Illness ANCEL EASLER is a 63 y.o. male presented to the ED 03/07/24 with complaint of hematuria, dyspnea. PMH significant for DM2, HTN, HLD, CAD/stent on Plavix , paroxysmal A-fib, CHF, diabetic neuropathy, GERD, left BKA status bladder cancer    PT Comments   Pt admitted with above diagnosis.  Pt currently with functional limitations due to the deficits listed below (see PT Problem List). Pt seated in recliner when PT arrived. Pt agreeable to therapy intervention. Pt required CGA for sit to stand to RW with proper UE placement, maintained static standing for 1:22 with reports of fatigue and pt noted to have desaturated to 87% on RA once returned to sitting pt quickly recovered to 90%. Pt left seated in recliner and all needs in place. Pt will benefit from acute skilled PT to increase their independence and safety with mobility to allow discharge.      If plan is discharge home, recommend the following: Assist for transportation;Help with stairs or ramp for entrance   Can travel by private vehicle        Equipment Recommendations  None recommended by PT    Recommendations for Other Services       Precautions / Restrictions Precautions Precautions: Fall Restrictions Weight Bearing Restrictions Per Provider Order: No LLE Weight Bearing Per Provider Order: Non weight bearing Other Position/Activity Restrictions: LBKA     Mobility  Bed Mobility               General bed mobility comments: pt seated in recliner when PT arrived.    Transfers Overall transfer level: Needs assistance Equipment used: Rolling walker (2 wheels) Transfers: Sit to/from Stand Sit to Stand: Contact guard assist           General transfer comment: min cues and push to stand from recliner to RW, pt able to maintain static standing for 1:22 with CGA and B  UE support    Ambulation/Gait               General Gait Details: NT   Stairs             Wheelchair Mobility     Tilt Bed    Modified Rankin (Stroke Patients Only)       Balance Overall balance assessment: Needs assistance Sitting-balance support: Feet supported, No upper extremity supported Sitting balance-[name]: Good     Standing balance support: During functional activity, Bilateral upper extremity supported Standing balance-[name]: Fair                              Hotel manager: No apparent difficulties  Cognition Arousal: Alert Behavior During Therapy: WFL for tasks assessed/performed   PT - Cognitive impairments: No apparent impairments                                Cueing    Exercises      General Comments        Pertinent Vitals/Pain Pain Assessment Pain Assessment: 0-10 Pain Score: 10-Worst pain ever Pain Location: all over, abdomen, back Pain Descriptors / Indicators: Aching, Discomfort, Grimacing, Guarding, Constant Pain Intervention(s): Limited activity within patient's tolerance, Monitored during session, Premedicated before session, Repositioned    Home Living  Prior Function            PT Goals (current goals can now be found in the care plan section) Acute Rehab PT Goals Patient Stated Goal: go home PT Goal Formulation: With patient Time For Goal Achievement: 03/24/24 Potential to Achieve Goals: Good Progress towards PT goals: Progressing toward goals    Frequency    Min 3X/week      PT Plan      Co-evaluation              AM-PAC PT "6 Clicks" Mobility   Outcome Measure  Help needed turning from your back to your side while in a flat bed without using bedrails?: None Help needed moving from lying on your back to sitting on the side of a flat bed without using bedrails?: None Help needed moving to  and from a bed to a chair (including a wheelchair)?: None Help needed standing up from a chair using your arms (e.g., wheelchair or bedside chair)?: A Little Help needed to walk in hospital room?: Total Help needed climbing 3-5 steps with a railing? : Total 6 Click Score: 17    End of Session Equipment Utilized During Treatment: Gait belt Activity Tolerance: Patient tolerated treatment well Patient left: in chair;with call bell/phone within reach Nurse Communication: Mobility status PT Visit Diagnosis: Unsteadiness on feet (R26.81);Difficulty in walking, not elsewhere classified (R26.2);Pain     Time: 0981-1914 PT Time Calculation (min) (ACUTE ONLY): 20 min  Charges:    $Therapeutic Activity: 8-22 mins PT General Charges $$ ACUTE PT VISIT: 1 Visit                     Cary Clarks, PT Acute Rehab    Annalee Kiang 03/14/2024, 4:17 PM

## 2024-03-14 NOTE — Plan of Care (Signed)
  Problem: Coping: Goal: Ability to adjust to condition or change in health will improve Outcome: Progressing   Problem: Fluid Volume: Goal: Ability to maintain a balanced intake and output will improve Outcome: Progressing   Problem: Health Behavior/Discharge Planning: Goal: Ability to manage health-related needs will improve Outcome: Progressing   Problem: Metabolic: Goal: Ability to maintain appropriate glucose levels will improve Outcome: Progressing   Problem: Nutritional: Goal: Maintenance of adequate nutrition will improve Outcome: Progressing   Problem: Health Behavior/Discharge Planning: Goal: Ability to manage health-related needs will improve Outcome: Progressing

## 2024-03-14 NOTE — Progress Notes (Signed)
 PROGRESS NOTE  Robert Burnett  DOB: February 11, 1961  PCP: Joaquin Mulberry, MD ZOX:[id number]  DOA: 03/07/2024  LOS: 7 days  Hospital Day: 8  Brief narrative: Robert Burnett is a 63 y.o. male with PMH significant for DM2, HTN, HLD, CAD/stent on Plavix , paroxysmal A-fib, CHF, diabetic neuropathy, GERD, left BKA status bladder cancer 5/19, patient presented to the ED with complaint of hematuria, dyspnea. Patient used to follow-up with Dr. Florencio Hunting at [facility] urology for bladder cancer.  He was on intravesical BCG at some point.  Currently not undergoing treatment.  He developed gross hematuria in January but did not bring this to the attention of any of his providers and has had persistent hematuria since then.  Over the last several weeks, he has become increasingly weak and developed shortness of breath on exertion.    In the ED, patient was afebrile, hemodynamically stable.   He was noted to have urinary retention. Urology needed to be called for Foley insertion.  During Foley insertion, patient became severely agitated and tachycardic.  He was started on CBI. Labs showed WC count 3.6, hemoglobin 4.6 with MCV 75, BUN/creatinine 20/1.51, potassium low at 3.4, sodium low at 129 IV Rocephin  was started by urology Admitted to [facility]  Since admission, patient continues to have hematuria.  He has required multiple attempts of catheter manipulation because of clots.  Continues to remain on CBI =================================================================== Subjective: Patient was seen and examined this morning. Hemodynamically stable.  Hemoglobin at 8.0,  S/p 1 PRBC transfusion yesterday 03/12/2024   ===================================================================   Assessment and plan:  Acute on chronic gross hematuria w Acute urinary retention -Symptomatic for past 5 months -S/p 03/12/2024 TURBT, with clot evacuation, and transurethral resection of bladder tumor -  Hemodynamically stable - Foley catheter with continuous irrigation in place-becoming clear   -Known bladder cancer and had decided to forego treatment.   Since admission, patient continues to have hematuria.  He has required multiple attempts of catheter manipulation because of clots.   Continues to remain on CBI Urology following - On Hyoscyamine for control of bladder spasm.    Symptomatic anemia -with acute on chronic blood loss anemia, iron deficiency - 5 months of bleeding from bladder cancer led to drop in hemoglobin to 4.6.   - Receiving blood transfusions. - total of 8 units of PRBC transfusion given so far. -another 1 unit PRBC transfused 5/24  - Continue to monitor.  Globin 8.0, stable, CBI-more clear -IV iron given. Continue PPI     Latest Ref Rng & Units 03/14/2024    3:09 AM 03/13/2024    2:55 AM 03/12/2024    1:01 PM  CBC  WBC 4.0 - 10.5 K/uL 2.9  3.6    Hemoglobin 13.0 - 17.0 g/dL 8.0  7.8  8.6   Hematocrit 39.0 - 52.0 % 25.7  24.9  26.6   Platelets 150 - 400 K/uL 92  95       Bladder cancer S/p TURBT 2022 - Patient used to follow-up with Dr. Florencio Hunting at [facility] urology for bladder cancer.   03/12/24 - cystoscopy and TURBT, clot evacuation, transurethral resection of bladder tumor, - CBI, more clear now  Hyponatremia Sodium level downtrending. - suspect SIADH. Not on IV fluid.  Improving  Type 2 diabetes mellitus w Peripheral neuropathy A1c 5.4 on 03/08/2024 PTA meds-Novolin  70/30 and Farxiga  Currently on SSI/Accu-Cheks.  Add Semglee  daily CBG (last 3)  Recent Labs    03/13/24 1629 03/13/24 2122 03/14/24 8119  GLUCAP 109* 102* 125*    CAD  w H/o MI 05/2022 s/p stent No anginal symptoms.  Troponin normal Eliquis  and Plavix  on hold On statin  Paroxysmal A-fib Continue Toprol  Eliquis  -on hold  Mild systolic CHF -remains stable HTN -stable PTA meds- Toprol , Lasix , valsartan , Farxiga  Currently on Toprol  only.  Others on hold Repeat  echocardiogram showed EF improved to 50 to 55%.  Currently compensated  Chronic daily smoker Smokes half pack per day Continue nicotine  patch  Impaired mobility Prior left BKA status Continue pain control with as needed Tylenol , as needed Norco, as needed Robaxin .  Patient is on Lyrica  -will try to increase dose of Lyrica  Still complaining of pain, muscle spasm  Mobility: Encourage ambulation.  May need PT eval after bleeding stops     Goals of care   Code Status: Full Code    DVT prophylaxis:  SCDs Start: 03/07/24 2210   Antimicrobials: None Fluid: None Consultants: Urology Family Communication: None at bedside  Status: Inpatient Level of care:  Stepdown   Patient is from: Home Needs to continue in-hospital care: Continues to have hematuria.  May need cystoscopy inpatient. Anticipated d/c to: Disposition unclear at this time      Diet:  Diet Order             Diet regular Fluid consistency: Thin  Diet effective now                   Scheduled Meds:  sodium chloride    Intravenous Once   sodium chloride    Intravenous Once   Chlorhexidine  Gluconate Cloth  6 each Topical Daily   feeding supplement  237 mL Oral TID BM   insulin  aspart  0-5 Units Subcutaneous QHS   insulin  aspart  0-9 Units Subcutaneous TID WC   insulin  glargine-yfgn  5 Units Subcutaneous Daily   metoprolol  succinate  50 mg Oral Daily   multivitamin with minerals  1 tablet Oral Daily   nicotine   14 mg Transdermal Daily   pneumococcal 20-valent conjugate vaccine  0.5 mL Intramuscular Tomorrow-1000   senna  1 tablet Oral BID    PRN meds: acetaminophen  **OR** acetaminophen , HYDROcodone -acetaminophen , hyoscyamine, loratadine , morphine  injection, ondansetron  **OR** ondansetron  (ZOFRAN ) IV, mouth rinse, polyethylene glycol   Infusions:   sodium chloride  irrigation Stopped (03/13/24 1415)    Antimicrobials: Anti-infectives (From admission, onward)    Start     Dose/Rate Route Frequency  Ordered Stop   03/12/24 0730  ceFAZolin  (ANCEF ) powder 2 g  Status:  Discontinued        2 g Other To Surgery 03/12/24 0724 03/12/24 0729   03/12/24 0730  ceFAZolin  (ANCEF ) IVPB 2g/100 mL premix        2 g 200 mL/hr over 30 Minutes Intravenous On call to O.R. 03/12/24 0729 03/12/24 0806   03/07/24 2015  cefTRIAXone  (ROCEPHIN ) 1 g in sodium chloride  0.9 % 100 mL IVPB        1 g 200 mL/hr over 30 Minutes Intravenous  Once 03/07/24 2001 03/07/24 2149       Objective: Vitals:   03/14/24 0800 03/14/24 0900  BP: 131/85 (!) 129/59  Pulse: 67 66  Resp: 18 15  Temp:    SpO2: 100% 100%    Intake/Output Summary (Last 24 hours) at 03/14/2024 1036 Last data filed at 03/14/2024 0630 Gross per 24 hour  Intake --  Output 2500 ml  Net -2500 ml   Filed Weights   03/07/24 2331  Weight: 62.7 kg  Weight change:  Body mass index is 18.24 kg/m.     General:  AAO x 3,  cooperative, no distress;   HEENT:  Normocephalic, PERRL, otherwise with in Normal limits   Neuro:  CNII-XII intact. , normal motor and sensation, reflexes intact   Lungs:   Clear to auscultation BL, Respirations unlabored,  No wheezes / crackles  Cardio:    S1/S2, RRR, No murmure, No Rubs or Gallops   Abdomen:  Soft, non-tender, bowel sounds active all four quadrants, no guarding or peritoneal signs.  Muscular  skeletal:  Limited exam -global generalized weaknesses - in bed, able to move all 4 extremities,   2+ pulses,  symmetric, No pitting edema  Skin:  Dry, warm to touch, negative for any Rashes,  Wounds: Please see nursing documentation       Data Review: I have personally reviewed the laboratory data and studies available.  F/u labs ordered Unresulted Labs (From admission, onward)     Start     Ordered   03/13/24 0500  CBC  Daily,   R     Question:  Specimen collection method  Answer:  Lab=Lab collect   03/12/24 0700   03/13/24 0500  Basic metabolic panel with GFR  Daily,   R     Question:  Specimen  collection method  Answer:  Lab=Lab collect   03/12/24 0700            SIGNED: [name], MD, FHM. FAAFP Total of 55 minutes of critical care was spent seeing the patient, reviewing medical records, drawn plan of care [facility],  Pager (please use [email or web address] to page/text)  Please use Epic Secure Chat for non-urgent communication (7AM-7PM) If 7PM-7AM, please contact night-coverage [email or web address],  03/14/2024, 10:36 AM

## 2024-03-14 NOTE — Progress Notes (Signed)
 Urology Inpatient Progress Note  Subjective: Postop day #2 status post TURBT and evacuation of clot from bladder. His urine remains clear with CBI off.  No complaints this morning.  Anti-infectives: Anti-infectives (From admission, onward)    Start     Dose/Rate Route Frequency Ordered Stop   03/12/24 0730  ceFAZolin  (ANCEF ) powder 2 g  Status:  Discontinued        2 g Other To Surgery 03/12/24 0724 03/12/24 0729   03/12/24 0730  ceFAZolin  (ANCEF ) IVPB 2g/100 mL premix        2 g 200 mL/hr over 30 Minutes Intravenous On call to O.R. 03/12/24 0729 03/12/24 0806   03/07/24 2015  cefTRIAXone  (ROCEPHIN ) 1 g in sodium chloride  0.9 % 100 mL IVPB        1 g 200 mL/hr over 30 Minutes Intravenous  Once 03/07/24 2001 03/07/24 2149       Current Facility-Administered Medications  Medication Dose Route Frequency Provider Last Rate Last Admin   0.9 %  sodium chloride  infusion (Manually program via Guardrails IV Fluids)   Intravenous Once Olena Bernard, NP   Held at 03/09/24 0634   0.9 %  sodium chloride  infusion (Manually program via Guardrails IV Fluids)   Intravenous Once Manny, Theodore B Jr., MD       acetaminophen  (TYLENOL ) tablet 650 mg  650 mg Oral Q6H PRN Rizwan, Saima, MD       Or   acetaminophen  (TYLENOL ) suppository 650 mg  650 mg Rectal Q6H PRN Rizwan, Saima, MD       Chlorhexidine  Gluconate Cloth 2 % PADS 6 each  6 each Topical Daily Rizwan, Saima, MD   6 each at 03/13/24 1700   feeding supplement (ENSURE ENLIVE / ENSURE PLUS) liquid 237 mL  237 mL Oral TID BM Dahal, Aminta Baldy, MD   237 mL at 03/14/24 0908   HYDROcodone -acetaminophen  (NORCO/VICODIN) 5-325 MG per tablet 1-2 tablet  1-2 tablet Oral Q4H PRN Rizwan, Saima, MD   2 tablet at 03/14/24 0857   hyoscyamine  (ANASPAZ ) disintergrating tablet 0.25 mg  0.25 mg Sublingual Q4H PRN Ellis Guys, NP   0.25 mg at 03/13/24 2157   insulin  aspart (novoLOG ) injection 0-5 Units  0-5 Units Subcutaneous QHS Dahal, Aminta Baldy, MD        insulin  aspart (novoLOG ) injection 0-9 Units  0-9 Units Subcutaneous TID WC Dahal, Binaya, MD   1 Units at 03/14/24 0759   insulin  glargine-yfgn (SEMGLEE ) injection 5 Units  5 Units Subcutaneous Daily Dahal, Binaya, MD   5 Units at 03/14/24 0948   loratadine  (CLARITIN ) tablet 10 mg  10 mg Oral Daily PRN Hoyt Macleod, MD   10 mg at 03/10/24 1040   metoprolol  succinate (TOPROL -XL) 24 hr tablet 50 mg  50 mg Oral Daily Rizwan, Saima, MD   50 mg at 03/14/24 0945   morphine  (PF) 2 MG/ML injection 1 mg  1 mg Intravenous Q4H PRN Dahal, Binaya, MD   1 mg at 03/14/24 1132   multivitamin with minerals tablet 1 tablet  1 tablet Oral Daily Dahal, Binaya, MD   1 tablet at 03/14/24 0945   nicotine  (NICODERM CQ  - dosed in mg/24 hours) patch 14 mg  14 mg Transdermal Daily Rizwan, Saima, MD   14 mg at 03/14/24 0948   ondansetron  (ZOFRAN ) tablet 4 mg  4 mg Oral Q6H PRN Rizwan, Saima, MD       Or   ondansetron  (ZOFRAN ) injection 4 mg  4 mg Intravenous Q6H PRN  Rizwan, Saima, MD   4 mg at 03/10/24 8119   Oral care mouth rinse  15 mL Mouth Rinse PRN Sedalia Dacosta, MD       pneumococcal 20-valent conjugate vaccine (PREVNAR 20) injection 0.5 mL  0.5 mL Intramuscular Tomorrow-1000 Rizwan, Saima, MD       polyethylene glycol (MIRALAX  / GLYCOLAX ) packet 17 g  17 g Oral Daily PRN Hoyt Macleod, MD       senna (SENOKOT) tablet 8.6 mg  1 tablet Oral BID Rizwan, Saima, MD   8.6 mg at 03/14/24 0945   sodium chloride  irrigation 0.9 % 3,000 mL  3,000 mL Irrigation Continuous Sattenfield, Cameron W, NP   Stopped at 03/13/24 1415     Objective: Vital signs in last 24 hours: Temp:  [97.9 F (36.6 C)-98.8 F (37.1 C)] 97.9 F (36.6 C) (05/26 0735) Pulse Rate:  [52-75] 66 (05/26 0900) Resp:  [13-20] 15 (05/26 0900) BP: (114-150)/(59-106) 129/59 (05/26 0900) SpO2:  [100 %] 100 % (05/26 0900)  Intake/Output from previous day: 05/25 0701 - 05/26 0700 In: 480 [P.O.:480] Out: 5550 [Urine:5550] Intake/Output this shift: No  intake/output data recorded.  GENERAL APPEARANCE:  Well appearing, well developed, well nourished, NAD HEENT:  Atraumatic, normocephalic, oropharynx clear ABDOMEN:  Soft, non-tender, no masses EXTREMITIES:  Moves all extremities well, without clubbing, cyanosis, or edema NEUROLOGIC:  Alert and oriented x 3, CN II-XII grossly intact MENTAL STATUS:  appropriate BACK:  Non-tender to palpation, No CVAT SKIN:  Warm, dry, and intact GU: Foley draining clear urine without clots.  Lab Results:  Recent Labs    03/13/24 0255 03/14/24 0309  WBC 3.6* 2.9*  HGB 7.8* 8.0*  HCT 24.9* 25.7*  PLT 95* 92*   BMET Recent Labs    03/13/24 0255 03/14/24 0309  NA 132* 130*  K 4.2 4.5  CL 102 101  CO2 22 21*  GLUCOSE 180* 90  BUN 18 20  CREATININE 1.15 1.12  CALCIUM  8.3* 8.3*   PT/INR No results for input(s): "LABPROT", "INR" in the last 72 hours. ABG No results for input(s): "PHART", "HCO3" in the last 72 hours.  Invalid input(s): "PCO2", "PO2"  Studies/Results: No results found.   Assessment & Plan: Recurrent bladder cancer -postop day 2 status post TURBT Gross hematuria with anemia -resolved following TURBT  He is doing well postoperatively.  His urine has remained clear. Continue Foley today due to extent of resection. Consider discontinuation of Foley tomorrow morning if urine remains clear.   Oda Bence, MD 03/14/2024

## 2024-03-15 ENCOUNTER — Encounter: Payer: Self-pay | Admitting: Pulmonary Disease

## 2024-03-15 ENCOUNTER — Other Ambulatory Visit (HOSPITAL_COMMUNITY): Payer: Self-pay

## 2024-03-15 ENCOUNTER — Other Ambulatory Visit (HOSPITAL_BASED_OUTPATIENT_CLINIC_OR_DEPARTMENT_OTHER): Payer: Self-pay

## 2024-03-15 DIAGNOSIS — D649 Anemia, unspecified: Secondary | ICD-10-CM | POA: Diagnosis not present

## 2024-03-15 LAB — CBC
HCT: 25.9 % — ABNORMAL LOW (ref 39.0–52.0)
Hemoglobin: 8 g/dL — ABNORMAL LOW (ref 13.0–17.0)
MCH: 28.5 pg (ref 26.0–34.0)
MCHC: 30.9 g/dL (ref 30.0–36.0)
MCV: 92.2 fL (ref 80.0–100.0)
Platelets: 92 10*3/uL — ABNORMAL LOW (ref 150–400)
RBC: 2.81 MIL/uL — ABNORMAL LOW (ref 4.22–5.81)
RDW: 16.6 % — ABNORMAL HIGH (ref 11.5–15.5)
WBC: 2.9 10*3/uL — ABNORMAL LOW (ref 4.0–10.5)
nRBC: 0 % (ref 0.0–0.2)

## 2024-03-15 LAB — TYPE AND SCREEN
ABO/RH(D): AB POS
Antibody Screen: NEGATIVE
Unit division: 0
Unit division: 0
Unit division: 0

## 2024-03-15 LAB — BASIC METABOLIC PANEL WITH GFR
Anion gap: 6 (ref 5–15)
BUN: 26 mg/dL — ABNORMAL HIGH (ref 8–23)
CO2: 26 mmol/L (ref 22–32)
Calcium: 8.9 mg/dL (ref 8.9–10.3)
Chloride: 102 mmol/L (ref 98–111)
Creatinine, Ser: 1.12 mg/dL (ref 0.61–1.24)
GFR, Estimated: >60 mL/min (ref >60)
Glucose, Bld: 119 mg/dL — ABNORMAL HIGH (ref 70–99)
Potassium: 4.7 mmol/L (ref 3.5–5.1)
Sodium: 134 mmol/L — ABNORMAL LOW (ref 135–145)

## 2024-03-15 LAB — BPAM RBC
Blood Product Expiration Date: 202506242359
Blood Product Expiration Date: 202506242359
Blood Product Expiration Date: 202506242359
ISSUE DATE / TIME: 202505240436
Unit Type and Rh: 6200
Unit Type and Rh: 6200
Unit Type and Rh: 6200

## 2024-03-15 LAB — GLUCOSE, CAPILLARY
Glucose-Capillary: 121 mg/dL — ABNORMAL HIGH (ref 70–99)
Glucose-Capillary: 172 mg/dL — ABNORMAL HIGH (ref 70–99)

## 2024-03-15 MED ORDER — PREGABALIN 50 MG PO CAPS
50.0000 mg | ORAL_CAPSULE | Freq: Two times a day (BID) | ORAL | 0 refills | Status: AC
  Filled 2024-03-15: qty 60, 30d supply, fill #0

## 2024-03-15 MED ORDER — HYDROCODONE-ACETAMINOPHEN 5-325 MG PO TABS
1.0000 | ORAL_TABLET | ORAL | 0 refills | Status: AC | PRN
  Filled 2024-03-15: qty 12, 1d supply, fill #0

## 2024-03-15 MED ORDER — ADULT MULTIVITAMIN W/MINERALS CH
1.0000 | ORAL_TABLET | Freq: Every day | ORAL | 0 refills | Status: AC
  Filled 2024-03-15: qty 30, 30d supply, fill #0

## 2024-03-15 MED ORDER — PREGABALIN 50 MG PO CAPS
50.0000 mg | ORAL_CAPSULE | Freq: Two times a day (BID) | ORAL | Status: DC
  Administered 2024-03-15: 50 mg via ORAL
  Filled 2024-03-15: qty 2

## 2024-03-15 MED ORDER — SENNA 8.6 MG PO TABS
1.0000 | ORAL_TABLET | Freq: Every day | ORAL | 0 refills | Status: AC
  Filled 2024-03-15: qty 120, 120d supply, fill #0

## 2024-03-15 MED ORDER — HYOSCYAMINE SULFATE 0.125 MG PO TBDP
0.2500 mg | ORAL_TABLET | ORAL | 0 refills | Status: AC | PRN
  Filled 2024-03-15: qty 10, 1d supply, fill #0

## 2024-03-15 NOTE — Discharge Summary (Signed)
 Physician Discharge Summary   Patient: Robert Burnett MRN: [id number] DOB: 09/01/1961  Admit date:     03/07/2024  Discharge date: 03/15/24  Discharge Physician: Robert Burnett   PCP: Robert Mulberry, MD   Recommendations at discharge:    Follow-up with urologist in 1 week Follow-up with PCP in 1-2 weeks -Wear holding possibly discontinuing the Plavix  discussed with your cardiology Resume Eliquis  in 3 days S/p TURBT 03/12/2024-pending path report -to be followed with urology  Discharge Diagnoses: Principal Problem:   Symptomatic anemia Active Problems:   CAD S/P percutaneous coronary angioplasty   Ischemic cardiomyopathy   PAF (paroxysmal atrial fibrillation) (HCC)   Tobacco abuse   Diabetic peripheral neuropathy associated with type 2 diabetes mellitus (HCC)   S/P BKA (below knee amputation) unilateral, left (HCC)   Gross hematuria with urinary retention   Protein-calorie malnutrition, severe   Malignant neoplasm of urinary bladder [facility])    Hospital Course: Robert Burnett is a 63 y.o. male with PMH significant for DM2, HTN, HLD, CAD/stent on Plavix , paroxysmal A-fib, CHF, diabetic neuropathy, GERD, left BKA status bladder cancer 5/19, patient presented to the ED with complaint of hematuria, dyspnea. Patient used to follow-up with Dr. Florencio Hunting at [facility] urology for bladder cancer.  He was on intravesical BCG at some point.  Currently not undergoing treatment.   He developed gross hematuria in January but did not bring this to the attention of any of his providers and has had persistent hematuria since then.  Over the last several weeks, he has become increasingly weak and developed shortness of breath on exertion.     In the ED, patient was afebrile, hemodynamically stable.   He was noted to have urinary retention. Urology needed to be called for Foley insertion.  During Foley insertion, patient became severely agitated and tachycardic.  He was started on  CBI. Labs showed WC count 3.6, hemoglobin 4.6 with MCV 75, BUN/creatinine 20/1.51, potassium low at 3.4, sodium low at 129 IV Rocephin  was started by urology Admitted to [facility]   Since admission, patient continues to have hematuria.  He has required multiple attempts of catheter manipulation because of clots.  Continues to remain on CBI.  Acute on chronic gross hematuria w Acute urinary retention Resolved -Symptomatic for past 5 months -S/p 03/12/2024 TURBT, with clot evacuation, and transurethral resection of bladder tumor - Follow-up with urologist-final path report and plan of care as an outpatient - Foley catheter was in place with continued irrigation, was discontinued on 03/14/2024 -Known bladder cancer and had decided to forego treatment.   - On Hyoscyamine for control of bladder spasm.      Symptomatic anemia -with acute on chronic blood loss anemia, iron deficiency - 5 months of bleeding from bladder cancer led to drop in hemoglobin to 4.6.   - Receiving blood transfusions. - total of 8 units of PRBC transfusion given so far. - last 1 unit PRBC was transfused on  5/24   - Continue to monitor.  Globin 8.0, stable, CBI-more clear -IV iron given. Continue PPI    Latest Ref Rng & Units 03/15/2024    3:08 AM 03/14/2024    3:09 AM 03/13/2024    2:55 AM  CBC  WBC 4.0 - 10.5 K/uL 2.9  2.9  3.6   Hemoglobin 13.0 - 17.0 g/dL 8.0  8.0  7.8   Hematocrit 39.0 - 52.0 % 25.9  25.7  24.9   Platelets 150 - 400 K/uL 92  92  95       Bladder cancer S/p TURBT 2022 - Patient used to follow-up with Dr. Florencio Hunting at [facility] urology for bladder cancer.   03/12/24 - cystoscopy and TURBT, clot evacuation, transurethral resection of bladder tumor, - CBI, more clear now--- discontinued 03/14/2024   Hyponatremia - Improved   Type 2 diabetes mellitus w Peripheral neuropathy A1c 5.4 on 03/08/2024 Continue PTA meds-Novolin  70/30 and Farxiga   CAD  w H/o MI 05/2022 s/p stent No anginal symptoms.   Troponin normal Eliquis  be restarted, discontinuing Plavix  Continue statin   Paroxysmal A-fib Continue Toprol  Eliquis  -starting in 3 days   Mild systolic CHF -remains stable HTN -stable PTA meds- Toprol , Lasix , valsartan , Farxiga  Currently on Toprol  only.  Others on hold Repeat echocardiogram showed EF improved to 50 to 55%.  Currently compensated   Chronic daily smoker Smokes half pack per day Continue nicotine  patch   Impaired mobility Prior left BKA status Continue pain control with as needed Tylenol , as needed Norco, as needed Robaxin .   I will restart Lyrica  -   Disposition: Home Diet recommendation:  Discharge Diet Orders (From admission, onward)     Start     Ordered   03/15/24 0000  Diet - low sodium heart healthy        03/15/24 1028           Regular diet DISCHARGE MEDICATION: Allergies as of 03/15/2024       Reactions   Baclofen Other (See Comments)   Delirium/hallucinations    Cymbalta  [duloxetine  Hcl] Diarrhea   Amlodipine Rash   Enalapril Hives, Rash   Prozac [fluoxetine Hcl] Rash        Medication List     PAUSE taking these medications    apixaban  5 MG Tabs tablet Wait to take this until: Mar 17, 2024 Commonly known as: Eliquis  Take 1 tablet (5 mg total) by mouth 2 (two) times daily.   clopidogrel  75 MG tablet Wait to take this until: March 25, 2024 Commonly known as: PLAVIX  TAKE ONE TABLET BY MOUTH ONCE DAILY       STOP taking these medications    valsartan  320 MG tablet Commonly known as: DIOVAN    vitamin C  1000 MG tablet       TAKE these medications    atorvastatin  80 MG tablet Commonly known as: LIPITOR  Take 1 tablet (80 mg total) by mouth daily.   dapagliflozin  propanediol 10 MG Tabs tablet Commonly known as: Farxiga  Take 1 tablet (10 mg total) by mouth daily before breakfast.   diclofenac  Sodium 1 % Gel Commonly known as: VOLTAREN  Apply 2 g topically 4 (four) times daily as needed (Left hip pain). What  changed: reasons to take this   docusate sodium  100 MG capsule Commonly known as: COLACE Take 1 capsule (100 mg total) by mouth daily. What changed:  when to take this reasons to take this   furosemide  40 MG tablet Commonly known as: LASIX  Take 1 tablet (40 mg total) by mouth daily.   HYDROcodone -acetaminophen  5-325 MG tablet Commonly known as: NORCO/VICODIN Take 1-2 tablets by mouth every 4 (four) hours as needed for up to 3 days for moderate pain (pain score 4-6).   hyoscyamine 0.125 MG Tbdp disintergrating tablet Commonly known as: ANASPAZ Place 2 tablets (0.25 mg total) under the tongue every 4 (four) hours as needed for up to 10 days for bladder spasms or cramping.   loratadine  10 MG tablet Commonly known as: CLARITIN  Take 1 tablet (10 mg  total) by mouth daily. What changed:  when to take this reasons to take this   methocarbamol  750 MG tablet Commonly known as: ROBAXIN  Take 1 tablet (750 mg total) by mouth every 8 (eight) hours as needed for muscle spasms.   metoprolol  succinate 50 MG 24 hr tablet Commonly known as: Toprol  XL Take 1 tablet (50 mg total) by mouth daily. Take with or immediately following a meal.   Misc. Devices Misc Knee walker.  Diagnosis -  left BKA   multivitamin with minerals Tabs tablet Take 1 tablet by mouth daily. Start taking on: Mar 16, 2024   naloxone  4 MG/0.1ML Liqd nasal spray kit Commonly known as: NARCAN  Place 1 spray into the nose as needed (opoid overdose).   nicotine  7 mg/24hr patch Commonly known as: NICODERM CQ  - dosed in mg/24 hr 7 mg patch daily x2 weeks and stop What changed:  how much to take how to take this when to take this additional instructions   NovoLIN  70/30 Kwikpen (70-30) 100 UNIT/ML KwikPen Generic drug: insulin  isophane & regular human KwikPen Inject 8 Units into the skin in the morning and at bedtime. Dose decrease What changed:  how much to take when to take this reasons to take this additional  instructions   pantoprazole  40 MG tablet Commonly known as: PROTONIX  Take 1 tablet (40 mg total) by mouth daily. What changed:  when to take this reasons to take this   pregabalin  50 MG capsule Commonly known as: LYRICA  Take 1 capsule (50 mg total) by mouth 2 (two) times daily. What changed:  medication strength how much to take   senna 8.6 MG Tabs tablet Commonly known as: SENOKOT Take 1 tablet (8.6 mg total) by mouth daily.        Discharge Exam: Filed Weights   03/07/24 2331  Weight: 62.7 kg        General:  AAO x 3,  cooperative, no distress;   HEENT:  Normocephalic, PERRL, otherwise with in Normal limits   Neuro:  CNII-XII intact. , normal motor and sensation, reflexes intact   Lungs:   Clear to auscultation BL, Respirations unlabored,  No wheezes / crackles  Cardio:    S1/S2, RRR, No murmure, No Rubs or Gallops   Abdomen:  Soft, non-tender, bowel sounds active all four quadrants, no guarding or peritoneal signs.  Muscular  skeletal:  Limited exam -global generalized weaknesses - in bed, able to move all 4 extremities,   2+ pulses,  symmetric, No pitting edema  Skin:  Dry, warm to touch, negative for any Rashes,  Wounds: Please see nursing documentation          Condition at discharge: good  The results of significant diagnostics from this hospitalization (including imaging, microbiology, ancillary and laboratory) are listed below for reference.   Imaging Studies: DG C-Arm 1-60 Min-No Report Result Date: 03/12/2024 Fluoroscopy was utilized by the requesting physician.  No radiographic interpretation.   ECHOCARDIOGRAM COMPLETE Result Date: 03/09/2024    ECHOCARDIOGRAM REPORT   Patient Name:   DELRAY REZA Date of Exam: 03/09/2024 Medical Rec #:  [id number]        Height:       73.0 in Accession #:    [id number]       Weight:       138.2 lb Date of Birth:  05/17/1961         BSA:          1.839 m Patient Age:  62 years         BP:           148/73  mmHg Patient Gender: M                HR:           92 bpm. Exam Location:  Inpatient Procedure: 2D Echo, Cardiac Doppler and Color Doppler (Both Spectral and Color            Flow Doppler were utilized during procedure). Indications:    Abnormal ECG 794.31 / R94.31  History:        Patient has prior history of Echocardiogram examinations, most                 recent 08/27/2022. CHF, CAD; Risk Factors:Diabetes and                 Hypertension.  Sonographer:    Astrid Blamer Referring Phys: [id number] BINAYA DAHAL IMPRESSIONS  1. Very mild septal hypokinesis. Left ventricular ejection fraction, by estimation, is 50 to 55%. The left ventricle has low normal function. The left ventricle has no regional wall motion abnormalities. Left ventricular diastolic parameters are consistent with Grade I diastolic dysfunction (impaired relaxation).  2. Right ventricular systolic function is normal. The right ventricular size is normal. There is normal pulmonary artery systolic pressure.  3. The mitral valve is normal in structure. No evidence of mitral valve regurgitation. No evidence of mitral stenosis.  4. The aortic valve is normal in structure. Aortic valve regurgitation is not visualized. No aortic stenosis is present.  5. The inferior vena cava is normal in size with greater than 50% respiratory variability, suggesting right atrial pressure of 3 mmHg. FINDINGS  Left Ventricle: Very mild septal hypokinesis. Left ventricular ejection fraction, by estimation, is 50 to 55%. The left ventricle has low normal function. The left ventricle has no regional wall motion abnormalities. The left ventricular internal cavity  size was normal in size. There is no left ventricular hypertrophy. Left ventricular diastolic parameters are consistent with Grade I diastolic dysfunction (impaired relaxation). Indeterminate filling pressures. Right Ventricle: The right ventricular size is normal. No increase in right ventricular wall thickness. Right  ventricular systolic function is normal. There is normal pulmonary artery systolic pressure. The tricuspid regurgitant velocity is 1.95 m/s, and  with an assumed right atrial pressure of 3 mmHg, the estimated right ventricular systolic pressure is 18.2 mmHg. Left Atrium: Left atrial size was normal in size. Right Atrium: Right atrial size was normal in size. Pericardium: There is no evidence of pericardial effusion. Mitral Valve: The mitral valve is normal in structure. No evidence of mitral valve regurgitation. No evidence of mitral valve stenosis. Tricuspid Valve: The tricuspid valve is normal in structure. Tricuspid valve regurgitation is not demonstrated. No evidence of tricuspid stenosis. Aortic Valve: The aortic valve is normal in structure. Aortic valve regurgitation is not visualized. No aortic stenosis is present. Aortic valve mean gradient measures 4.0 mmHg. Aortic valve peak gradient measures 7.4 mmHg. Aortic valve area, by VTI measures 2.30 cm. Pulmonic Valve: The pulmonic valve was normal in structure. Pulmonic valve regurgitation is not visualized. No evidence of pulmonic stenosis. Aorta: The aortic root is normal in size and structure. Venous: The inferior vena cava is normal in size with greater than 50% respiratory variability, suggesting right atrial pressure of 3 mmHg. IAS/Shunts: No atrial level shunt detected by color flow Doppler.  LEFT VENTRICLE PLAX 2D LVIDd:  4.60 cm   Diastology LVIDs:         3.40 cm   LV e' medial:    7.40 cm/s LV PW:         0.70 cm   LV E/e' medial:  10.5 LV IVS:        0.70 cm   LV e' lateral:   15.10 cm/s LVOT diam:     2.00 cm   LV E/e' lateral: 5.1 LV SV:         60 LV SV Index:   33 LVOT Area:     3.14 cm  RIGHT VENTRICLE RV S prime:     16.80 cm/s LEFT ATRIUM             Index        RIGHT ATRIUM           Index LA Vol (A2C):   58.0 ml 31.54 ml/m  RA Area:     12.00 cm LA Vol (A4C):   40.5 ml 22.02 ml/m  RA Volume:   24.00 ml  13.05 ml/m LA Biplane  Vol: 48.6 ml 26.43 ml/m  AORTIC VALVE AV Area (Vmax):    2.36 cm AV Area (Vmean):   2.37 cm AV Area (VTI):     2.30 cm AV Vmax:           136.00 cm/s AV Vmean:          92.600 cm/s AV VTI:            0.262 m AV Peak Grad:      7.4 mmHg AV Mean Grad:      4.0 mmHg LVOT Vmax:         102.00 cm/s LVOT Vmean:        70.000 cm/s LVOT VTI:          0.192 m LVOT/AV VTI ratio: 0.73  AORTA Ao Root diam: 3.00 cm MITRAL VALVE               TRICUSPID VALVE MV Area (PHT): 3.61 cm    TR Peak grad:   15.2 mmHg MV E velocity: 77.60 cm/s  TR Vmax:        195.00 cm/s MV A velocity: 72.40 cm/s MV E/A ratio:  1.07        SHUNTS                            Systemic VTI:  0.19 m                            Systemic Diam: 2.00 cm Maudine Sos MD Electronically signed by Maudine Sos MD Signature Date/Time: 03/09/2024/1:13:16 PM    Final    CT ABDOMEN PELVIS W CONTRAST Result Date: 03/07/2024 CLINICAL DATA:  Abdominal pain EXAM: CT ABDOMEN AND PELVIS WITH CONTRAST TECHNIQUE: Multidetector CT imaging of the abdomen and pelvis was performed using the standard protocol following bolus administration of intravenous contrast. RADIATION DOSE REDUCTION: This exam was performed according to the departmental dose-optimization program which includes automated exposure control, adjustment of the mA and/or kV according to patient size and/or use of iterative reconstruction technique. CONTRAST:  OMNIPAQUE  IOHEXOL  300 MG/ML  SOLN COMPARISON:  10/02/2020 FINDINGS: Lower chest: No acute abnormality Hepatobiliary: Small layering stones within the gallbladder. No biliary ductal dilatation. No CT evidence of acute cholecystitis. No focal hepatic abnormality. Pancreas: No focal  abnormality or ductal dilatation. Spleen: No focal abnormality. Mild splenomegaly with a craniocaudal length of 13.6 cm. Adrenals/Urinary Tract: Poor prior left nephrectomy adrenal glands unremarkable. No suspicious right renal lesion. No stones or hydronephrosis.  Foley catheter in the bladder. There appears to be a filling defect along the right posterior bladder wall measuring 2.5 cm. Stomach/Bowel: Stomach, large and small bowel grossly unremarkable. Vascular/Lymphatic: Diffuse aortoiliac atherosclerosis. No evidence of aneurysm or adenopathy. Reproductive: No visible focal abnormality. Other: No free fluid or free air. Musculoskeletal: No acute bony abnormality. IMPRESSION: Cholelithiasis.  No CT evidence of acute cholecystitis. Splenomegaly, similar to prior study. Soft tissue filling defect within the right posterior bladder. While this could reflect blood clot, cannot exclude bladder wall mass. Recommend urologic consultation for possible cystoscopy. Aortoiliac atherosclerosis. Electronically Signed   By: Janeece Mechanic M.D.   On: 03/07/2024 21:48   DG Chest 2 View Result Date: 03/07/2024 CLINICAL DATA:  Shortness of breath EXAM: CHEST - 2 VIEW COMPARISON:  X-ray 06/04/2022 FINDINGS: Overlapping cardiac leads. No consolidation, pneumothorax or effusion. No edema. Overlapping cardiac leads. Degenerative changes along the spine. IMPRESSION: No acute cardiopulmonary disease. Electronically Signed   By: Adrianna Horde M.D.   On: 03/07/2024 16:12    Microbiology: Results for orders placed or performed during the hospital encounter of 03/07/24  MRSA Next Gen by PCR, Nasal     Status: None   Collection Time: 03/08/24  8:30 AM   Specimen: Nasal Mucosa; Nasal Swab  Result Value Ref Range Status   MRSA by PCR Next Gen NOT DETECTED NOT DETECTED Final    Comment: (NOTE) The GeneXpert MRSA Assay (FDA approved for NASAL specimens only), is one component of a comprehensive MRSA colonization surveillance program. It is not intended to diagnose MRSA infection nor to guide or monitor treatment for MRSA infections. Test performance is not FDA approved in patients less than 77 years old. Performed at [facility] ([facility] [address] [place], [place]  [address]     Labs: CBC: Recent Labs  Lab 03/09/24 0304 03/09/24 1145 03/10/24 0300 03/11/24 0305 03/12/24 0310 03/12/24 1301 03/13/24 0255 03/14/24 0309 03/15/24 0308  WBC 13.5*   < > 6.1 5.2 3.0*  --  3.6* 2.9* 2.9*  NEUTROABS 10.9*  --  4.4 3.8 1.5*  --   --   --   --   HGB 6.4*   < > 7.9* 7.4* 6.7* 8.6* 7.8* 8.0* 8.0*  HCT 19.4*   < > 24.2* 23.1* 21.3* 26.6* 24.9* 25.7* 25.9*  MCV 79.2*   < > 87.1 88.8 89.9  --  91.5 90.5 92.2  PLT 177   < > 101* 103* 97*  --  95* 92* 92*   < > = values in this interval not displayed.   Basic Metabolic Panel: Recent Labs  Lab 03/11/24 0305 03/12/24 0310 03/13/24 0255 03/14/24 0309 03/15/24 0308  NA 130* 130* 132* 130* 134*  K 4.2 4.4 4.2 4.5 4.7  CL 101 103 102 101 102  CO2 25 23 22  21* 26  GLUCOSE 116* 96 180* 90 119*  BUN 21 21 18 20  26*  CREATININE 1.34* 1.16 1.15 1.12 1.12  CALCIUM  8.0* 8.2* 8.3* 8.3* 8.9   Liver Function Tests: No results for input(s): "AST", "ALT", "ALKPHOS", "BILITOT", "PROT", "ALBUMIN" in the last 168 hours. CBG: Recent Labs  Lab 03/14/24 0738 03/14/24 1143 03/14/24 1608 03/14/24 2112 03/15/24 0727  GLUCAP 125* 137* 171* 127* 121*    Discharge time spent:  greater than 30 minutes.  Signed: [name], MD [facility] 03/15/2024

## 2024-03-15 NOTE — Plan of Care (Signed)
  Problem: Clinical Measurements: Goal: Respiratory complications will improve Outcome: Progressing Goal: Cardiovascular complication will be avoided Outcome: Progressing   Problem: Activity: Goal: Risk for activity intolerance will decrease Outcome: Progressing   Problem: Nutrition: Goal: Adequate nutrition will be maintained Outcome: Progressing   Problem: Coping: Goal: Level of anxiety will decrease Outcome: Progressing   Problem: Pain Managment: Goal: General experience of comfort will improve and/or be controlled Outcome: Not Progressing

## 2024-03-15 NOTE — Progress Notes (Signed)
 PROGRESS NOTE  Robert Burnett  DOB: 1961/09/14  PCP: Joaquin Mulberry, MD ZOX:[id number]  DOA: 03/07/2024  LOS: 8 days  Hospital Day: 9  Brief narrative: KACE HARTJE is a 63 y.o. male with PMH significant for DM2, HTN, HLD, CAD/stent on Plavix , paroxysmal A-fib, CHF, diabetic neuropathy, GERD, left BKA status bladder cancer 5/19, patient presented to the ED with complaint of hematuria, dyspnea. Patient used to follow-up with Dr. Florencio Hunting at [facility] urology for bladder cancer.  He was on intravesical BCG at some point.  Currently not undergoing treatment.  He developed gross hematuria in January but did not bring this to the attention of any of his providers and has had persistent hematuria since then.  Over the last several weeks, he has become increasingly weak and developed shortness of breath on exertion.    In the ED, patient was afebrile, hemodynamically stable.   He was noted to have urinary retention. Urology needed to be called for Foley insertion.  During Foley insertion, patient became severely agitated and tachycardic.  He was started on CBI. Labs showed WC count 3.6, hemoglobin 4.6 with MCV 75, BUN/creatinine 20/1.51, potassium low at 3.4, sodium low at 129 IV Rocephin  was started by urology Admitted to [facility]  Since admission, patient continues to have hematuria.  He has required multiple attempts of catheter manipulation because of clots.  Continues to remain on CBI =================================================================== Subjective: The patient was seen and examined this morning, stable no acute distress Medically stable Going stable at 8.0  Apparently patient had clot obstructing urine early in the morning because discomfort, required over wire catheter exchange-patient found relief post decompressed bladder    Last 1 u PRBC transfusion 03/12/2024   ===================================================================   Assessment and  plan:  Acute on chronic gross hematuria w Acute urinary retention -Symptomatic for past 5 months -S/p 03/12/2024 TURBT, with clot evacuation, and transurethral resection of bladder tumor - Hemodynamically stable - Foley catheter with continuous irrigation in place-becoming clear   -Known bladder cancer and had decided to forego treatment.   Since admission, patient continues to have hematuria.  He has required multiple attempts of catheter manipulation because of clots.   Continues to remain on CBI Urology following - On Hyoscyamine for control of bladder spasm.    Symptomatic anemia -with acute on chronic blood loss anemia, iron deficiency - 5 months of bleeding from bladder cancer led to drop in hemoglobin to 4.6.   - Receiving blood transfusions. - total of 8 units of PRBC transfusion given so far. -another 1 unit PRBC transfused 5/24  - Continue to monitor.  Globin 8.0, stable, CBI-more clear -IV iron given. Continue PPI     Latest Ref Rng & Units 03/15/2024    3:08 AM 03/14/2024    3:09 AM 03/13/2024    2:55 AM  CBC  WBC 4.0 - 10.5 K/uL 2.9  2.9  3.6   Hemoglobin 13.0 - 17.0 g/dL 8.0  8.0  7.8   Hematocrit 39.0 - 52.0 % 25.9  25.7  24.9   Platelets 150 - 400 K/uL 92  92  95      Bladder cancer S/p TURBT 2022 - Patient used to follow-up with Dr. Florencio Hunting at [facility] urology for bladder cancer.   03/12/24 - cystoscopy and TURBT, clot evacuation, transurethral resection of bladder tumor, - CBI, more clear now  Hyponatremia Sodium level downtrending. - suspect SIADH. Not on IV fluid.  Improving  Type 2 diabetes mellitus w Peripheral neuropathy  A1c 5.4 on 03/08/2024 PTA meds-Novolin  70/30 and Farxiga  Currently on SSI/Accu-Cheks.  Add Semglee  daily CBG (last 3)  Recent Labs    03/14/24 1608 03/14/24 2112 03/15/24 0727  GLUCAP 171* 127* 121*    CAD  w H/o MI 05/2022 s/p stent No anginal symptoms.  Troponin normal Eliquis  and Plavix  on hold On  statin  Paroxysmal A-fib Continue Toprol  Eliquis  -on hold  Mild systolic CHF -remains stable HTN -stable PTA meds- Toprol , Lasix , valsartan , Farxiga  Currently on Toprol  only.  Others on hold Repeat echocardiogram showed EF improved to 50 to 55%.  Currently compensated  Chronic daily smoker Smokes half pack per day Continue nicotine  patch  Impaired mobility Prior left BKA status Continue pain control with as needed Tylenol , as needed Norco, as needed Robaxin .  Patient is on Lyrica  -will try to increase dose of Lyrica  Still complaining of pain, muscle spasm  Mobility: Encourage ambulation.  May need PT eval after bleeding stops     Goals of care   Code Status: Full Code    DVT prophylaxis:  SCDs Start: 03/07/24 2210   Antimicrobials: None Fluid: None Consultants: Urology Family Communication: None at bedside  Status: Inpatient Level of care:  Med-Surg   Patient is from: Home Needs to continue in-hospital care: Continues to have hematuria.  May need cystoscopy inpatient. Anticipated d/c to: Disposition unclear at this time      Diet:  Diet Order             Diet regular Fluid consistency: Thin  Diet effective now                   Scheduled Meds:  sodium chloride    Intravenous Once   sodium chloride    Intravenous Once   Chlorhexidine  Gluconate Cloth  6 each Topical Daily   feeding supplement  237 mL Oral TID BM   insulin  aspart  0-5 Units Subcutaneous QHS   insulin  aspart  0-9 Units Subcutaneous TID WC   insulin  glargine-yfgn  5 Units Subcutaneous Daily   metoprolol  succinate  50 mg Oral Daily   multivitamin with minerals  1 tablet Oral Daily   nicotine   14 mg Transdermal Daily   pneumococcal 20-valent conjugate vaccine  0.5 mL Intramuscular Tomorrow-1000   pregabalin   50 mg Oral BID   senna  1 tablet Oral BID    PRN meds: acetaminophen  **OR** acetaminophen , HYDROcodone -acetaminophen , hyoscyamine, loratadine , morphine  injection, ondansetron   **OR** ondansetron  (ZOFRAN ) IV, mouth rinse, polyethylene glycol   Infusions:   sodium chloride  irrigation Stopped (03/13/24 1415)    Antimicrobials: Anti-infectives (From admission, onward)    Start     Dose/Rate Route Frequency Ordered Stop   03/12/24 0730  ceFAZolin  (ANCEF ) powder 2 g  Status:  Discontinued        2 g Other To Surgery 03/12/24 0724 03/12/24 0729   03/12/24 0730  ceFAZolin  (ANCEF ) IVPB 2g/100 mL premix        2 g 200 mL/hr over 30 Minutes Intravenous On call to O.R. 03/12/24 0729 03/12/24 0806   03/07/24 2015  cefTRIAXone  (ROCEPHIN ) 1 g in sodium chloride  0.9 % 100 mL IVPB        1 g 200 mL/hr over 30 Minutes Intravenous  Once 03/07/24 2001 03/07/24 2149       Objective: Vitals:   03/15/24 0847 03/15/24 0900  BP:  135/69  Pulse: 76 70  Resp: (!) 28 14  Temp:    SpO2: 100% 100%    Intake/Output  Summary (Last 24 hours) at 03/15/2024 0959 Last data filed at 03/15/2024 0900 Gross per 24 hour  Intake --  Output 2250 ml  Net -2250 ml   Filed Weights   03/07/24 2331  Weight: 62.7 kg   Weight change:  Body mass index is 18.24 kg/m.         General:  AAO x 3,  cooperative, no distress;   HEENT:  Normocephalic, PERRL, otherwise with in Normal limits   Neuro:  CNII-XII intact. , normal motor and sensation, reflexes intact   Lungs:   Clear to auscultation BL, Respirations unlabored,  No wheezes / crackles  Cardio:    S1/S2, RRR, No murmure, No Rubs or Gallops   Abdomen:  Soft, non-tender, bowel sounds active all four quadrants, no guarding or peritoneal signs.  Muscular  skeletal:  Left BKA Limited exam -global generalized weaknesses - in bed, able to move all 4 extremities,   2+ pulses,  symmetric, No pitting edema  Skin:  Dry, warm to touch, negative for any Rashes,  CBI discontinued, Foley catheter was exchanged Foley catheter in place, clear -yellow urine appreciated-Foley catheter           Data Review: I have personally reviewed the  laboratory data and studies available.  F/u labs ordered Unresulted Labs (From admission, onward)     Start     Ordered   03/13/24 0500  CBC  Daily,   R     Question:  Specimen collection method  Answer:  Lab=Lab collect   03/12/24 0700   03/13/24 0500  Basic metabolic panel with GFR  Daily,   R     Question:  Specimen collection method  Answer:  Lab=Lab collect   03/12/24 0700            SIGNED: [name], MD, FHM. FAAFP Total of 55 minutes of critical care was spent seeing the patient, reviewing medical records, drawn plan of care [facility],  Pager (please use [email or web address] to page/text)  Please use Epic Secure Chat for non-urgent communication (7AM-7PM) If 7PM-7AM, please contact night-coverage [email or web address],  03/15/2024, 9:59 AM

## 2024-03-15 NOTE — Progress Notes (Signed)
 Discharge medications delivered to patient at bedside D [facility]

## 2024-03-15 NOTE — Progress Notes (Signed)
 Discharge instructions given to patient questions asked and answered, home medications stored in main pharmacy returned to patient D Rosevelt Constable RN

## 2024-03-15 NOTE — Progress Notes (Signed)
   3 Days Post-Op Subjective: Clot obstruction of urine early in the morning.  Patient in extreme discomfort requiring over wire catheter exchange.  Please see separate procedure note.  He fell asleep immediately after decompressing his bladder.  Objective: Vital signs in last 24 hours: Temp:  [97.6 F (36.4 C)-98.6 F (37 C)] 98.4 F (36.9 C) (05/27 0400) Pulse Rate:  [56-76] 60 (05/27 0600) Resp:  [13-26] 21 (05/27 0600) BP: (106-132)/(47-97) 121/47 (05/27 0600) SpO2:  [99 %-100 %] 99 % (05/27 0600)  Assessment/Plan: 62y male with gross hematuria since January.  PMH significant for left nephroureterectomy-high-grade urothelial carcinoma, TURBT, and 2 cycles of BCG before stopping due to loss of insurance-all in 2022.  Lost to follow-up since.  # Gross hematuria # Hx of bladder cancer # Clot obstruction of urine  S/P cystoscopy with clot evac and fulguration and TURBT- Dr. Secundino Dach 5/24.  Clear yellow urine and stable HgB again today. TOV this a.m.  Path pending  Intake/Output from previous day: 05/26 0701 - 05/27 0700 In: -  Out: 1350 [Urine:1350]  Intake/Output this shift: No intake/output data recorded.  Physical Exam:  General: Alert and oriented CV: No cyanosis Lungs: equal chest rise Abdomen: Soft, NTND, no rebound or guarding Gu: Three-way 34f hematuria today in place  Lab Results: Recent Labs    03/13/24 0255 03/14/24 0309 03/15/24 0308  HGB 7.8* 8.0* 8.0*  HCT 24.9* 25.7* 25.9*   BMET Recent Labs    03/14/24 0309 03/15/24 0308  NA 130* 134*  K 4.5 4.7  CL 101 102  CO2 21* 26  GLUCOSE 90 119*  BUN 20 26*  CREATININE 1.12 1.12  CALCIUM  8.3* 8.9  HGB 8.0* 8.0*  WBC 2.9* 2.9*     Studies/Results: No results found.     LOS: 8 days   Alla Ar, NP [facility] Pager: [phone]  03/15/2024, 9:21 AM

## 2024-03-16 ENCOUNTER — Telehealth: Payer: Self-pay

## 2024-03-16 LAB — SURGICAL PATHOLOGY

## 2024-03-16 NOTE — Transitions of Care (Post Inpatient/ED Visit) (Signed)
   03/16/2024  Name: Robert Burnett MRN: [id number] DOB: September 06, 1961  Today's TOC FU Call Status: Today's TOC FU Call Status:: Unsuccessful Call (1st Attempt) Unsuccessful Call (1st Attempt) Date: 03/16/24  Attempted to reach the patient regarding the most recent Inpatient/ED visit. Call went to voice mail, but mail box was full and unable to leave a message.   Follow Up Plan: Additional outreach attempts will be made to reach the patient to complete the Transitions of Care (Post Inpatient/ED visit) call.    Katheryn Pandy MSN, RN RN Case Sales executive Health  [facility] Office Hours M-F [phone] Direct Dial: [phone] Main Phone [phone]  Fax: [phone] [email or web address]

## 2024-03-17 ENCOUNTER — Telehealth: Payer: Self-pay

## 2024-03-17 NOTE — Transitions of Care (Post Inpatient/ED Visit) (Signed)
   03/17/2024  Name: Robert Burnett MRN: [id number] DOB: [birth year]  Today's TOC FU Call Status: Today's TOC FU Call Status:: Unsuccessful Call (2nd Attempt) Unsuccessful Call (2nd Attempt) Date: 03/17/24  Attempted to reach the patient regarding the most recent Inpatient/ED visit. Unable to leave a voice mail with call back information as voice mail box is full.   Follow Up Plan: Additional outreach attempts will be made to reach the patient to complete the Transitions of Care (Post Inpatient/ED visit) call.    Katheryn Pandy MSN, RN RN Case Sales executive Health  [facility] Office Hours M-F [phone] Direct Dial: [phone] Main Phone [phone]  Fax: [phone] [email or web address]

## 2024-03-18 ENCOUNTER — Telehealth: Payer: Self-pay

## 2024-03-18 NOTE — Transitions of Care (Post Inpatient/ED Visit) (Addendum)
   03/18/2024  Name: Robert Burnett MRN: [id number] DOB: [birth year]  Today's TOC FU Call Status: Today's TOC FU Call Status:: Unsuccessful Call (3rd Attempt) Unsuccessful Call (3rd Attempt) Date: 03/18/24  Attempted to reach the patient regarding the most recent Inpatient/ED visit.  On each outreach attempt  for post discharge follow up by [facility], this RN CM received a recording that recepient of call is not available and the mail box is full. Home and cell number are the same number.   Chart review for this outreach attempt notes indicated the infusion center had tried to outreach to patient on 5/28 and sent a MyChart message for a call back to schedule infusion. RN CM contacted infusion center to see if any contact had been made and they reported the same issues with phone number/voice mail box full and that MyChart message had not been read from what they see on their end.   Follow Up Plan: No further outreach attempts will be made at this time. We have been unable to contact the patient.   Katheryn Pandy MSN, RN RN Case Sales executive Health  [facility] Office Hours M-F [phone] Direct Dial: [phone] Main Phone [phone]  Fax: [phone] [email or web address]

## 2024-03-25 ENCOUNTER — Other Ambulatory Visit: Payer: Self-pay | Admitting: Family Medicine

## 2024-04-04 ENCOUNTER — Encounter: Payer: Self-pay | Admitting: Pulmonary Disease

## 2024-05-06 ENCOUNTER — Other Ambulatory Visit: Payer: Self-pay | Admitting: Family Medicine

## 2024-05-24 ENCOUNTER — Telehealth: Payer: Self-pay

## 2024-05-24 NOTE — Telephone Encounter (Signed)
 SABRA

## 2024-05-24 NOTE — Telephone Encounter (Signed)
 Patient called asking if Dr. Harden will do a order/prescription for a wheelchair, wheelchair cushion and a potty chair.  Please call [phone] and advise patient on the status of this.

## 2024-05-24 NOTE — Telephone Encounter (Signed)
 Please call pt. The pt has not been seen in a year and a half. Insurance would require that we see the pt in office recently. Can make next available appt.

## 2024-06-09 ENCOUNTER — Ambulatory Visit: Admitting: Orthopedic Surgery

## 2024-06-09 DIAGNOSIS — Z89512 Acquired absence of left leg below knee: Secondary | ICD-10-CM

## 2024-06-09 DIAGNOSIS — S88112A Complete traumatic amputation at level between knee and ankle, left lower leg, initial encounter: Secondary | ICD-10-CM

## 2024-06-10 ENCOUNTER — Encounter: Payer: Self-pay | Admitting: Orthopedic Surgery

## 2024-06-10 NOTE — Progress Notes (Signed)
 Office Visit Note   Patient: Robert Burnett           Date of Birth: [birth year]           MRN: [id number] Visit Date: 06/09/2024              Requested by: Delbert Clam, MD [address] [place],  [place] [address] PCP: Delbert Clam, MD  Chief Complaint  Patient presents with   Left Leg - Follow-up    BKA      HPI: Patient is a 63 year old gentleman with a left transtibial amputation.  Patient states he has arthritic pain in multiple joints including his shoulders back torso and upper extremities.  Assessment & Plan: Visit Diagnoses:  1. Below-knee amputation of left lower extremity, initial encounter [facility])     Plan: Will set patient up for physical therapy upstairs.  A prescription was provided for a new prosthesis.  Patient was provided a prescription for a wheelchair.  Follow-Up Instructions: Return in about 2 months (around 08/09/2024).   Ortho Exam  Patient is alert, oriented, no adenopathy, well-dressed, normal affect, normal respiratory effort. Examination patient has a well-healed residual limb.  He has decreased volume with subsiding into the socket.  Patient is at risk for ulceration and has instability with the loose fit  Patient is an existing left transtibial  amputee.  Patient's current comorbidities are not expected to impact the ability to function with the prescribed prosthesis. Patient verbally communicates a strong desire to use a prosthesis. Patient currently requires mobility aids to ambulate without a prosthesis.  Expects not to use mobility aids with a new prosthesis. Patient is expected to resume or reach their K Level within 6 months. Patient was active before the amputation and independent with stairs, uneven terrain, varying cadence, and a community ambulator.  Patient is a K3 level ambulator that spends a lot of time walking around on uneven terrain over obstacles, up and down stairs, and ambulates with a variable  cadence.       Imaging: No results found. No images are attached to the encounter.  Labs: Lab Results  Component Value Date   HGBA1C 5.4 03/08/2024   HGBA1C 6.7 12/08/2023   HGBA1C 8.7 (A) 07/28/2023   ESRSEDRATE 124 (H) 07/22/2022   ESRSEDRATE 115 (H) 03/25/2022   ESRSEDRATE 4 06/01/2014   CRP 26.5 (H) 07/22/2022   CRP 21.4 (H) 03/25/2022   CRP <0.5 06/01/2014   REPTSTATUS 09/29/2022 FINAL 09/24/2022   GRAMSTAIN NO WBC SEEN NO ORGANISMS SEEN  09/24/2022   CULT  09/24/2022    RARE PROTEUS VULGARIS RARE ENTEROCOCCUS FAECALIS NO ANAEROBES ISOLATED Performed at [facility], [address], [place], [place] [address]    [facility] ENTEROCOCCUS FAECALIS 09/24/2022   LABORGA PROTEUS VULGARIS 09/24/2022     Lab Results  Component Value Date   ALBUMIN 3.6 03/07/2024   ALBUMIN 4.2 12/08/2023   ALBUMIN 4.6 07/28/2023   PREALBUMIN 5 (L) 07/23/2022   PREALBUMIN <5 (L) 03/25/2022    Lab Results  Component Value Date   MG 2.0 03/07/2024   MG 1.7 07/26/2022   MG 1.9 06/05/2022   Lab Results  Component Value Date   VD25OH 17.42 (L) 07/28/2022    Lab Results  Component Value Date   PREALBUMIN 5 (L) 07/23/2022   PREALBUMIN <5 (L) 03/25/2022      Latest Ref Rng & Units 03/15/2024    3:08 AM 03/14/2024    3:09 AM  03/13/2024    2:55 AM  CBC EXTENDED  WBC 4.0 - 10.5 K/uL 2.9  2.9  3.6   RBC 4.22 - 5.81 MIL/uL 2.81  2.84  2.72   Hemoglobin 13.0 - 17.0 g/dL 8.0  8.0  7.8   HCT 60.9 - 52.0 % 25.9  25.7  24.9   Platelets 150 - 400 K/uL 92  92  95      There is no height or weight on file to calculate BMI.  Orders:  No orders of the defined types were placed in this encounter.  No orders of the defined types were placed in this encounter.    Procedures: No procedures performed  Clinical Data: No additional findings.  ROS:  All other systems negative, except as noted in the HPI. Review of Systems  Objective: Vital Signs: There were no vitals taken  for this visit.  Specialty Comments:  No specialty comments available.  PMFS History: Patient Active Problem List   Diagnosis Date Noted   Malignant neoplasm of urinary bladder (HCC) 03/14/2024   Protein-calorie malnutrition, severe 03/10/2024   Iron  deficiency anemia secondary to blood loss (chronic) 03/10/2024   Symptomatic anemia 03/07/2024   Gross hematuria with urinary retention 03/07/2024   Dehiscence of amputation stump (HCC) 09/24/2022   S/P BKA (below knee amputation) unilateral, left (HCC) 09/24/2022   Hx of BKA, left (HCC)    Cutaneous abscess of left ankle 07/23/2022   Cellulitis of left foot    CAD S/P percutaneous coronary angioplasty 07/22/2022   Ischemic cardiomyopathy 07/22/2022   CKD (chronic kidney disease) stage 2, GFR 60-89 ml/min 07/22/2022   Normocytic anemia 07/22/2022   Tobacco abuse 07/22/2022   PAF (paroxysmal atrial fibrillation) (HCC) 07/22/2022   Hyponatremia 07/22/2022   NSTEMI (non-ST elevated myocardial infarction) (HCC) 06/04/2022   Type 2 diabetes mellitus with complication, with long-term current use of insulin  (HCC) 06/04/2022   Hypomagnesemia 06/04/2022   Hyperlipidemia 06/04/2022   History of transmetatarsal amputation of foot (HCC) 04/30/2022   Gangrene of left foot (HCC) 03/25/2022   Hypokalemia 03/25/2022   Necrotizing soft tissue infection    Abscess of tendon sheath, left ankle and foot    Lateral epicondylitis of left elbow 08/01/2021   Ureteral cancer, 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   Proteinuria 09/19/2018   Hematuria 09/19/2018   Anterior cervical adenopathy 09/19/2018   Fatty liver disease, nonalcoholic 10/27/2016   Hypertension 06/27/2016   Thoracic spondylosis with myelopathy 03/16/2013   Carpal tunnel syndrome 03/16/2013   Chest pain 03/16/2013   Type 2 diabetes mellitus with left diabetic foot infection (HCC) 03/16/2013   Debility 03/16/2013   Chronic pain syndrome 03/16/2013   Lumbar degenerative disc disease 12/12/2011    Costochondritis 12/12/2011   Diabetic peripheral neuropathy associated with type 2 diabetes mellitus (HCC) 12/12/2011   Past Medical History:  Diagnosis Date   Allergy    Anemia    Anemia    Arthritis    ARTHRITIS IN SPINE - MAKES PT HAVE CHEST PAIN- USES fENTANYL  PATCH    Asthma    IN TEENS    Bacteremia    a. 03/2022 Group B Strep bacteremia in setting of diabetic L foot infxn/gangrene/osteo.   CAD (coronary artery disease)    Chronic HFrEF (heart failure with reduced ejection fraction) (HCC)    Depression    Diabetes mellitus    Diabetic peripheral neuropathy associated with type 2 diabetes mellitus (HCC) 12/12/2011   Fatty liver disease, nonalcoholic 10/27/2016   Foot osteomyelitis, left (HCC)  a. 2012 s/p toe amputations on L; b. 03/2022 - admitted w/ wet gangrene and necrotizing infxn w/ osteomyelitis-->s/p transmetatarsal followed by Lisfranc amputation.   GERD (gastroesophageal reflux disease)    H/O degenerative disc disease    History of blood transfusion    History of echocardiogram    a. 03/2022 Echo: EF 55-60%, no rwma, nl RV fxn, mildly elev PASP. Mild MR.   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(HCC)     Family History  Problem Relation Age of Onset   Cancer Mother    Hypertension Father    Diabetes Father    Lung cancer Father    Diabetes Sister    Cancer Sister     Past Surgical History:  Procedure Laterality Date   ACHILLES TENDON SURGERY Left 03/28/2022   Procedure: ACHILLES LENGTHENING/KIDNER;  Surgeon: Silva Juliene SAUNDERS, DPM;  Location: [facility];  Service: Podiatry;  Laterality: Left;   AMPUTATION Left 03/25/2022   Procedure: AMPUTATION DIGITS,TRANSMETARSAL AMPUTATION, REMOVAL OF TOES AND BONE BEHIND TOES ;  Surgeon: Silva Juliene SAUNDERS, DPM;  Location: [facility];  Service: Podiatry;  Laterality: Left;   AMPUTATION Left 07/23/2022   Procedure: LEFT BELOW KNEE AMPUTATION;  Surgeon: Harden Jerona GAILS, MD;   Location: [facility];  Service: Orthopedics;  Laterality: Left;   APPLICATION OF WOUND VAC Left 03/25/2022   Procedure: APPLICATION OF WOUND VAC;  Surgeon: Silva Juliene SAUNDERS, DPM;  Location: [facility];  Service: Podiatry;  Laterality: Left;   CORONARY STENT INTERVENTION N/A 06/05/2022   Procedure: CORONARY STENT INTERVENTION;  Surgeon: Claudene Victory ORN, MD;  Location: [facility];  Service: Cardiovascular;  Laterality: N/A;   CYSTOSCOPY W/ RETROGRADES Bilateral 03/12/2024   Procedure: CYSTOSCOPY, WITH RETROGRADE PYELOGRAM;  Surgeon: Alvaro Ricardo KATHEE Mickey., MD;  Location: [facility];  Service: Urology;  Laterality: Bilateral;   CYSTOSCOPY/RETROGRADE/URETEROSCOPY Left 10/29/2020   Procedure: CYSTOSCOPY/RETROGRADE/ LEFT URETEROSCOPY WITH BIOPSY, LEFT URETERAL STENT;  Surgeon: Renda Glance, MD;  Location: [facility];  Service: Urology;  Laterality: Left;   IRRIGATION AND DEBRIDEMENT FOOT Left 03/28/2022   Procedure: AMPUTATION LISFRANC, SECONDARY WOUND CLOSURE;  Surgeon: Silva Juliene SAUNDERS, DPM;  Location: [facility];  Service: Podiatry;  Laterality: Left;   KNEE SURGERY Bilateral 1975 and 1989   LEFT HEART CATH AND CORONARY ANGIOGRAPHY N/A 06/05/2022   Procedure: LEFT HEART CATH AND CORONARY ANGIOGRAPHY;  Surgeon: Claudene Victory ORN, MD;  Location: [facility];  Service: Cardiovascular;  Laterality: N/A;   MULTIPLE TOOTH EXTRACTIONS     ROBOT ASSITED LAPAROSCOPIC NEPHROURETERECTOMY Left 12/13/2020   Procedure: XI ROBOT ASSITED LAPAROSCOPIC NEPHROURETERECTOMY , POST OPERATIVE INTRAVESICAL GEMCITABINE ;  Surgeon: Renda Glance, MD;  Location: [facility];  Service: Urology;  Laterality: Left;  ONLY NEEDS 240 MIN   STUMP REVISION Left 09/24/2022   Procedure: REVISION LEFT BELOW KNEE AMPUTATION;  Surgeon: Harden Jerona GAILS, MD;  Location: [facility];  Service: Orthopedics;  Laterality: Left;   TOEM AMPUTATION  Left    2012, second toe   TRANSURETHRAL RESECTION OF BLADDER TUMOR N/A 08/29/2021   Procedure: TRANSURETHRAL RESECTION OF  BLADDER TUMOR (TURBT) WITH CYSTOSCOPY;  Surgeon: Renda Glance, MD;  Location: [facility];  Service: Urology;  Laterality: N/A;  GENERAL ANESTHESIA WITH PARALYSIS   TRANSURETHRAL RESECTION OF BLADDER TUMOR N/A 03/12/2024   Procedure: TURBT (TRANSURETHRAL RESECTION OF BLADDER TUMOR);  Surgeon: Alvaro Ricardo KATHEE Mickey., MD;  Location: [facility];  Service: Urology;  Laterality: N/A;   WISDOM TOOTH EXTRACTION     Social History   Occupational History   Not on file  Tobacco Use   Smoking status: Every Day    Types: Pipe   Smokeless tobacco: Former    Types: Chew   Tobacco comments:    Using pipe until about a week ago (05/2022)    Quit cigarettes 2003ish  Vaping Use   Vaping status: Never Used  Substance and Sexual Activity   Alcohol use: No   Drug use: No   Sexual activity: Not on file

## 2024-06-14 ENCOUNTER — Ambulatory Visit: Admitting: Orthopedic Surgery

## 2024-08-11 ENCOUNTER — Ambulatory Visit: Admitting: Orthopedic Surgery

## 2024-08-22 ENCOUNTER — Encounter: Payer: Self-pay | Admitting: Radiology

## 2024-10-08 ENCOUNTER — Other Ambulatory Visit: Payer: Self-pay | Admitting: Family Medicine

## 2024-11-17 ENCOUNTER — Other Ambulatory Visit: Payer: Self-pay | Admitting: Family Medicine
